# Patient Record
Sex: Male | Born: 1950 | Race: White | Hispanic: No | Marital: Married | Smoking: Never smoker
Health system: Southern US, Community
[De-identification: ages and names within clinical notes are randomized; demographics above are authoritative.]

## PROBLEM LIST (undated history)

## (undated) DIAGNOSIS — J9601 Acute respiratory failure with hypoxia: Secondary | ICD-10-CM

## (undated) DIAGNOSIS — I10 Essential (primary) hypertension: Secondary | ICD-10-CM

## (undated) DIAGNOSIS — M1990 Unspecified osteoarthritis, unspecified site: Secondary | ICD-10-CM

## (undated) DIAGNOSIS — Z9889 Other specified postprocedural states: Secondary | ICD-10-CM

## (undated) DIAGNOSIS — I4892 Unspecified atrial flutter: Secondary | ICD-10-CM

## (undated) DIAGNOSIS — M353 Polymyalgia rheumatica: Secondary | ICD-10-CM

## (undated) DIAGNOSIS — C060 Malignant neoplasm of cheek mucosa: Secondary | ICD-10-CM

## (undated) DIAGNOSIS — I2699 Other pulmonary embolism without acute cor pulmonale: Secondary | ICD-10-CM

## (undated) DIAGNOSIS — I2510 Atherosclerotic heart disease of native coronary artery without angina pectoris: Secondary | ICD-10-CM

## (undated) DIAGNOSIS — R760 Raised antibody titer: Secondary | ICD-10-CM

## (undated) DIAGNOSIS — M797 Fibromyalgia: Secondary | ICD-10-CM

## (undated) DIAGNOSIS — Z87442 Personal history of urinary calculi: Secondary | ICD-10-CM

## (undated) DIAGNOSIS — I5033 Acute on chronic diastolic (congestive) heart failure: Secondary | ICD-10-CM

## (undated) DIAGNOSIS — I4891 Unspecified atrial fibrillation: Secondary | ICD-10-CM

## (undated) DIAGNOSIS — I509 Heart failure, unspecified: Secondary | ICD-10-CM

## (undated) DIAGNOSIS — J189 Pneumonia, unspecified organism: Secondary | ICD-10-CM

## (undated) DIAGNOSIS — Z951 Presence of aortocoronary bypass graft: Secondary | ICD-10-CM

## (undated) DIAGNOSIS — Z8679 Personal history of other diseases of the circulatory system: Secondary | ICD-10-CM

## (undated) DIAGNOSIS — I340 Nonrheumatic mitral (valve) insufficiency: Secondary | ICD-10-CM

## (undated) DIAGNOSIS — E7800 Pure hypercholesterolemia, unspecified: Secondary | ICD-10-CM

## (undated) DIAGNOSIS — I499 Cardiac arrhythmia, unspecified: Secondary | ICD-10-CM

## (undated) HISTORY — DX: Unspecified osteoarthritis, unspecified site: M19.90

## (undated) HISTORY — DX: Unspecified atrial fibrillation: I48.91

## (undated) HISTORY — DX: Unspecified atrial flutter: I48.92

## (undated) HISTORY — DX: Polymyalgia rheumatica: M35.3

## (undated) HISTORY — DX: Raised antibody titer: R76.0

## (undated) HISTORY — DX: Pure hypercholesterolemia, unspecified: E78.00

## (undated) HISTORY — PX: FRACTURE SURGERY: SHX138

## (undated) HISTORY — DX: Heart failure, unspecified: I50.9

## (undated) HISTORY — PX: HERNIA REPAIR: SHX51

## (undated) HISTORY — DX: Acute respiratory failure with hypoxia: J96.01

## (undated) HISTORY — PX: OTHER SURGICAL HISTORY: SHX169

## (undated) HISTORY — PX: APPENDECTOMY: SHX54

## (undated) HISTORY — DX: Acute on chronic diastolic (congestive) heart failure: I50.33

## (undated) HISTORY — DX: Other pulmonary embolism without acute cor pulmonale: I26.99

## (undated) HISTORY — PX: SHOULDER SURGERY: SHX246

## (undated) HISTORY — DX: Atherosclerotic heart disease of native coronary artery without angina pectoris: I25.10

---

## 2000-02-09 ENCOUNTER — Ambulatory Visit (HOSPITAL_BASED_OUTPATIENT_CLINIC_OR_DEPARTMENT_OTHER): Admission: RE | Admit: 2000-02-09 | Discharge: 2000-02-09 | Payer: Self-pay | Admitting: Orthopedic Surgery

## 2001-01-17 ENCOUNTER — Ambulatory Visit (HOSPITAL_BASED_OUTPATIENT_CLINIC_OR_DEPARTMENT_OTHER): Admission: RE | Admit: 2001-01-17 | Discharge: 2001-01-17 | Payer: Self-pay | Admitting: Orthopedic Surgery

## 2001-03-16 ENCOUNTER — Encounter: Admission: RE | Admit: 2001-03-16 | Discharge: 2001-03-16 | Payer: Self-pay | Admitting: Infectious Diseases

## 2001-04-19 ENCOUNTER — Encounter: Admission: RE | Admit: 2001-04-19 | Discharge: 2001-04-19 | Payer: Self-pay | Admitting: Infectious Diseases

## 2001-09-13 ENCOUNTER — Encounter: Admission: RE | Admit: 2001-09-13 | Discharge: 2001-09-13 | Payer: Self-pay | Admitting: Infectious Diseases

## 2004-04-07 ENCOUNTER — Emergency Department (HOSPITAL_COMMUNITY): Admission: EM | Admit: 2004-04-07 | Discharge: 2004-04-07 | Payer: Self-pay | Admitting: Emergency Medicine

## 2008-05-10 DIAGNOSIS — I2699 Other pulmonary embolism without acute cor pulmonale: Secondary | ICD-10-CM

## 2008-05-10 HISTORY — DX: Other pulmonary embolism without acute cor pulmonale: I26.99

## 2008-07-29 ENCOUNTER — Inpatient Hospital Stay (HOSPITAL_COMMUNITY): Admission: EM | Admit: 2008-07-29 | Discharge: 2008-07-31 | Payer: Self-pay | Admitting: Emergency Medicine

## 2008-07-29 DIAGNOSIS — R760 Raised antibody titer: Secondary | ICD-10-CM | POA: Insufficient documentation

## 2008-07-29 HISTORY — DX: Raised antibody titer: R76.0

## 2008-10-02 DIAGNOSIS — I119 Hypertensive heart disease without heart failure: Secondary | ICD-10-CM | POA: Insufficient documentation

## 2008-10-02 DIAGNOSIS — IMO0001 Reserved for inherently not codable concepts without codable children: Secondary | ICD-10-CM

## 2008-10-02 DIAGNOSIS — Z86711 Personal history of pulmonary embolism: Secondary | ICD-10-CM

## 2008-10-03 ENCOUNTER — Ambulatory Visit: Payer: Self-pay | Admitting: Critical Care Medicine

## 2008-10-04 ENCOUNTER — Telehealth (INDEPENDENT_AMBULATORY_CARE_PROVIDER_SITE_OTHER): Payer: Self-pay | Admitting: *Deleted

## 2008-10-04 LAB — CONVERTED CEMR LAB
ALT: 30 units/L (ref 0–53)
AST: 30 units/L (ref 0–37)
Albumin: 4 g/dL (ref 3.5–5.2)
Alkaline Phosphatase: 44 units/L (ref 39–117)
BUN: 27 mg/dL — ABNORMAL HIGH (ref 6–23)
Basophils Absolute: 0 10*3/uL (ref 0.0–0.1)
Basophils Relative: 0.2 % (ref 0.0–3.0)
Bilirubin, Direct: 0 mg/dL (ref 0.0–0.3)
CO2: 29 meq/L (ref 19–32)
Calcium: 8.9 mg/dL (ref 8.4–10.5)
Chloride: 109 meq/L (ref 96–112)
Creatinine, Ser: 1.3 mg/dL (ref 0.4–1.5)
Eosinophils Absolute: 0.2 10*3/uL (ref 0.0–0.7)
Eosinophils Relative: 1.9 % (ref 0.0–5.0)
GFR calc non Af Amer: 59.84 mL/min (ref >60)
Glucose, Bld: 94 mg/dL (ref 70–99)
HCT: 49 % (ref 39.0–52.0)
Hemoglobin: 16.8 g/dL (ref 13.0–17.0)
Lymphocytes Relative: 21 % (ref 12.0–46.0)
Lymphs Abs: 1.8 10*3/uL (ref 0.7–4.0)
MCHC: 34.3 g/dL (ref 30.0–36.0)
MCV: 94.3 fL (ref 78.0–100.0)
Monocytes Absolute: 0.7 10*3/uL (ref 0.1–1.0)
Monocytes Relative: 8.6 % (ref 3.0–12.0)
Neutro Abs: 5.9 10*3/uL (ref 1.4–7.7)
Neutrophils Relative %: 68.3 % (ref 43.0–77.0)
Platelets: 155 10*3/uL (ref 150.0–400.0)
Potassium: 4.1 meq/L (ref 3.5–5.1)
RBC: 5.2 M/uL (ref 4.22–5.81)
RDW: 12.9 % (ref 11.5–14.6)
Sodium: 141 meq/L (ref 135–145)
Total Bilirubin: 1.2 mg/dL (ref 0.3–1.2)
Total Protein: 7 g/dL (ref 6.0–8.3)
WBC: 8.6 10*3/uL (ref 4.5–10.5)

## 2008-12-23 ENCOUNTER — Encounter: Payer: Self-pay | Admitting: Internal Medicine

## 2008-12-25 ENCOUNTER — Ambulatory Visit: Payer: Self-pay | Admitting: Internal Medicine

## 2008-12-25 DIAGNOSIS — R05 Cough: Secondary | ICD-10-CM

## 2009-01-01 ENCOUNTER — Ambulatory Visit: Payer: Self-pay | Admitting: Critical Care Medicine

## 2009-03-05 ENCOUNTER — Ambulatory Visit: Payer: Self-pay | Admitting: Critical Care Medicine

## 2009-04-10 ENCOUNTER — Telehealth: Payer: Self-pay | Admitting: Critical Care Medicine

## 2009-04-10 LAB — CONVERTED CEMR LAB
AntiThromb III Func: 91 % (ref 76–126)
Anticardiolipin IgA: <3 (ref <10)
Anticardiolipin IgG: 3 (ref <10)
Anticardiolipin IgM: <3 (ref <10)
Homocysteine: 8.6 micromoles/L (ref 4.0–15.4)
Protein C Activity: 138 % — ABNORMAL HIGH (ref 75–133)
Protein S Activity: 133 % — ABNORMAL HIGH (ref 69–129)
Protein S Ag, Total: 100 % (ref 70–140)

## 2009-10-22 ENCOUNTER — Encounter: Payer: Self-pay | Admitting: Critical Care Medicine

## 2009-12-11 ENCOUNTER — Telehealth: Payer: Self-pay | Admitting: Critical Care Medicine

## 2010-06-09 NOTE — Progress Notes (Signed)
Summary: Hypercoaguable panel  Phone Note Outgoing Call   Reason for Call: Discuss lab or test results Summary of Call: call pt and tell him I received his hypercoaguable panel blood test from Dr Merla Riches from 10/23/09 and it is normal Initial call taken by: Storm Frisk MD,  December 11, 2009 2:41 PM  Follow-up for Phone Call        Called, spoke with pt.  Pt advised hypercoag panal blood test from 6.16.11 received from Dr. Merla Riches and are normal per PW.  He verbalized understanding. Gweneth Dimitri RN  December 12, 2009 9:34 AM

## 2010-08-20 LAB — BASIC METABOLIC PANEL
BUN: 28 mg/dL — ABNORMAL HIGH (ref 6–23)
CO2: 29 mEq/L (ref 19–32)
CO2: 29 mEq/L (ref 19–32)
Calcium: 8.2 mg/dL — ABNORMAL LOW (ref 8.4–10.5)
Calcium: 9 mg/dL (ref 8.4–10.5)
Chloride: 103 mEq/L (ref 96–112)
Chloride: 103 mEq/L (ref 96–112)
Chloride: 103 mEq/L (ref 96–112)
Creatinine, Ser: 1.13 mg/dL (ref 0.4–1.5)
Creatinine, Ser: 1.18 mg/dL (ref 0.4–1.5)
GFR calc Af Amer: >60 mL/min (ref >60)
GFR calc Af Amer: >60 mL/min (ref >60)
GFR calc non Af Amer: >60 mL/min (ref >60)
GFR calc non Af Amer: >60 mL/min (ref >60)
Glucose, Bld: 102 mg/dL — ABNORMAL HIGH (ref 70–99)
Glucose, Bld: 110 mg/dL — ABNORMAL HIGH (ref 70–99)
Potassium: 3.6 mEq/L (ref 3.5–5.1)
Potassium: 3.6 mEq/L (ref 3.5–5.1)
Potassium: 3.6 mEq/L (ref 3.5–5.1)
Sodium: 136 mEq/L (ref 135–145)
Sodium: 138 mEq/L (ref 135–145)

## 2010-08-20 LAB — CBC
HCT: 37.4 % — ABNORMAL LOW (ref 39.0–52.0)
HCT: 45 % (ref 39.0–52.0)
Hemoglobin: 12.9 g/dL — ABNORMAL LOW (ref 13.0–17.0)
Hemoglobin: 15.5 g/dL (ref 13.0–17.0)
MCHC: 34.5 g/dL (ref 30.0–36.0)
MCHC: 34.5 g/dL (ref 30.0–36.0)
MCV: 92.3 fL (ref 78.0–100.0)
MCV: 93.4 fL (ref 78.0–100.0)
MCV: 93.4 fL (ref 78.0–100.0)
Platelets: 176 10*3/uL (ref 150–400)
RBC: 4.12 MIL/uL — ABNORMAL LOW (ref 4.22–5.81)
RBC: 4.82 MIL/uL (ref 4.22–5.81)
RDW: 13.4 % (ref 11.5–15.5)
RDW: 13.6 % (ref 11.5–15.5)
WBC: 11.1 10*3/uL — ABNORMAL HIGH (ref 4.0–10.5)
WBC: 9.6 10*3/uL (ref 4.0–10.5)

## 2010-08-20 LAB — URINALYSIS, ROUTINE W REFLEX MICROSCOPIC
Bilirubin Urine: NEGATIVE
Glucose, UA: NEGATIVE mg/dL
Hgb urine dipstick: NEGATIVE
Nitrite: NEGATIVE
Protein, ur: NEGATIVE mg/dL
Specific Gravity, Urine: 1.027 (ref 1.005–1.030)
Urobilinogen, UA: 0.2 mg/dL (ref 0.0–1.0)
pH: 5.5 (ref 5.0–8.0)

## 2010-08-20 LAB — PROTIME-INR
INR: 1 (ref 0.00–1.49)
INR: 1.2 (ref 0.00–1.49)
Prothrombin Time: 13.9 seconds (ref 11.6–15.2)
Prothrombin Time: 15.3 seconds — ABNORMAL HIGH (ref 11.6–15.2)
Prothrombin Time: 15.6 seconds — ABNORMAL HIGH (ref 11.6–15.2)

## 2010-08-20 LAB — BRAIN NATRIURETIC PEPTIDE: Pro B Natriuretic peptide (BNP): 45.6 pg/mL (ref 0.0–100.0)

## 2010-08-20 LAB — POCT CARDIAC MARKERS
CKMB, poc: <1 ng/mL — ABNORMAL LOW (ref 1.0–8.0)
Myoglobin, poc: 73.1 ng/mL (ref 12–200)
Troponin i, poc: <0.05 ng/mL (ref 0.00–0.09)

## 2010-08-20 LAB — TSH: TSH: 2.019 u[IU]/mL (ref 0.350–4.500)

## 2010-08-20 LAB — APTT: aPTT: 38 seconds — ABNORMAL HIGH (ref 24–37)

## 2010-08-20 LAB — LUPUS ANTICOAGULANT PANEL
DRVVT: 46 secs (ref 36.1–47.0)
Lupus Anticoagulant: DETECTED — AB
PTT Lupus Anticoagulant: 66.8 secs — ABNORMAL HIGH (ref 36.3–48.8)
PTTLA 4:1 Mix: 64.4 secs — ABNORMAL HIGH (ref 36.3–48.8)
PTTLA Confirmation: 12 secs — ABNORMAL HIGH (ref <8.0)

## 2010-08-20 LAB — DIFFERENTIAL
Basophils Absolute: 0 10*3/uL (ref 0.0–0.1)
Basophils Relative: 0 % (ref 0–1)
Eosinophils Absolute: 0.1 10*3/uL (ref 0.0–0.7)
Eosinophils Relative: 1 % (ref 0–5)
Lymphocytes Relative: 10 % — ABNORMAL LOW (ref 12–46)
Lymphs Abs: 1.1 10*3/uL (ref 0.7–4.0)
Monocytes Absolute: 1 10*3/uL (ref 0.1–1.0)
Monocytes Relative: 9 % (ref 3–12)
Neutro Abs: 8.9 10*3/uL — ABNORMAL HIGH (ref 1.7–7.7)
Neutrophils Relative %: 80 % — ABNORMAL HIGH (ref 43–77)

## 2010-08-20 LAB — PROTHROMBIN GENE MUTATION

## 2010-08-20 LAB — HEPATIC FUNCTION PANEL
AST: 20 U/L (ref 0–37)
Albumin: 3.3 g/dL — ABNORMAL LOW (ref 3.5–5.2)
Total Bilirubin: 1.1 mg/dL (ref 0.3–1.2)
Total Protein: 6.5 g/dL (ref 6.0–8.3)

## 2010-08-20 LAB — D-DIMER, QUANTITATIVE: D-Dimer, Quant: 1.52 ug/mL-FEU — ABNORMAL HIGH (ref 0.00–0.48)

## 2010-08-20 LAB — PROTEIN S, TOTAL: Protein S Ag, Total: 93 % (ref 70–140)

## 2010-08-20 LAB — HOMOCYSTEINE: Homocysteine: 7.3 umol/L (ref 4.0–15.4)

## 2010-08-20 LAB — FACTOR 5 ASSAY: Factor V Activity: 74 % (ref 62–140)

## 2010-08-20 LAB — PROTEIN C, TOTAL: Protein C, Total: 73 % (ref 70–140)

## 2010-09-22 NOTE — Discharge Summary (Signed)
Peter Bruce, Peter Bruce                   ACCOUNT NO.:  [id number]   MEDICAL RECORD NO.:  [id number]          PATIENT TYPE:  INP   LOCATION:  1401                         FACILITY:  [facility]   PHYSICIAN:  Monte Fantasia, MD  DATE OF BIRTH:  December 29, 1950   DATE OF ADMISSION:  07/29/2008  DATE OF DISCHARGE:                               DISCHARGE SUMMARY   PRIMARY CARE PHYSICIAN:  Harrel Lemon. Merla Riches, M.D.   UROLOGISTLynelle Smoke I. Patsi Sears, M.D.   DISCHARGE DIAGNOSES:  1. Diffuse multiple bilateral pulmonary embolisms.  2. Hypertension.  3. Fibromyalgia.  4. Degenerative joint disease.  5. Nephrolithiasis.  6. Lupus anticoagulant positive.   MEDICATIONS UPON DISCHARGE:  1. Rivaroxaban 15 mg p.o. b.i.d. for 21 days, and then 20 mg p.o.      daily for 6 months as per the research protocol.  2. Albuterol two puffs inhalation q.6h.  3. Hydrochlorothiazide 12 mg p.o. daily.  4. Lisinopril 20 mg p.o. daily.  5. Prevacid 30 mg p.o. daily.  6. Tramadol 50 mg p.o. b.i.d.  7. Verapamil 180 mg p.o. daily  8. Lisinopril 20 mg p.o. daily.  9. Temazepam 30 mg p.o. q.h.s. p.r.n. insomnia.  10.Prednisone 10 mg p.o. Qdaily 2-3 times a week p.r.n. pain.   COURSE DURING THE HOSPITAL STAY:  This 60 year old of Caucasian male  patient was admitted on July 29, 2008 with increasing shortness of  breath.  The patient felt that he was asymptomatic until three days ago,  and then she started feeling some difficulty breathing.  The symptoms  continued and continued and got worse until Sunday, and then he came in  to the ER.  The patient underwent a CT scan of the chest with contrast  which revealed a diffuse bilateral PE, and the patient was  anticoagulated with Lovenox and Coumadin.  Prior to the treatment, the  patient was evaluated for protein C, protein S and lupus anticoagulant.  His lupus anticoagulant study screen was positive.  The patient's  shortness of breath has improved significantly.  During  the stay in the  hospital, the patient was enrolled to participate in a research [employer] for Rivaroxaban factor Xa inhibitor treatment for  PE.  The patient has agreed to the same and was randomized for the  factor 10 inhibitor treatment.  As per the research protocol, the  patient is on 15 mg p.o. b.i.d. for 21 days and then followed by 20 mg  once daily.  The patient needs to follow up with the research group as  per their recommendations.  The research office number is [phone].  All  instructions will be provided as per the research group as per  discussions with them.   RADIOLOGICAL INVESTIGATIONS DONE DURING THE STAY IN THE HOSPITAL:  1. Chest x-ray done on July 29, 2008.  Impression - minimal      atelectasis and scaring of the left base; otherwise unremarkable.  2. CT angiogram of the chest done on July 29, 2008.  Impression -      multiple bilateral pulmonary embolism, most  prominent on the right,      mild right base atelectasis and small right pleural effusion.   LABORATORIES DONE DURING THE SAY IN THE HOSPITAL:  Total WBC 10.5,  hemoglobin 12.9, hematocrit 37.4, platelet count 164, PT is 35.3, INR  3.2.  Sodium 136, potassium 3.6, bicarbonate 29, chloride 103, BUN 22,  creatinine 1.06, glucose 110, calcium 8.2, total bilirubin 1.1, direct  0.2, indirect 0.9, alkaline phosphatase 38, AST 20, ALT 21, total  protein 6.5, albumin 3.3.  Cardiac enzymes have been negative.  TSH  2.019 and homocysteine 7.3.  UA was negative.  Lupus anticoagulant study  - PTT LA 66.8 which was high, PT LA confirmation 12.0 which is high.  PT  LA __________ mix is 64.4 which is high.  dRVVT 46.  dRVVT 1 x 1 mix not  applicable, and lupus anticoagulant is detected.  Prothrombin II gene  mutation not detected.   ASSESSMENT AND PLAN:  The patient at present denies any symptomatic  complaints.  Shortness of breath has resolved.  The patient is medically  stable to be discharged.   Will follow up with discharge instructions as  provided by the research group.  Will need to follow up with the  research facility at [phone] in a month, and, as per the discussions  with the research group, need to repeat LFTs within a month.  The  patient also needs to follow up with his primary care physician, Dr.  Merla Riches and Dr. Patsi Sears.  The patient has been informed regarding  the same.   Total time for discharge summary is 40 minutes.      Monte Fantasia, MD  Electronically Signed     MP/MEDQ  D:  07/31/2008  T:  07/31/2008  Job:  [id number]   cc:   Lynelle Smoke I. Patsi Sears, M.D.  Fax: [phone]   Harrel Lemon. Merla Riches, M.D.  Fax: [phone]

## 2010-09-22 NOTE — H&P (Signed)
Peter Bruce                   ACCOUNT NO.:  [id number]   MEDICAL RECORD NO.:  [id number]          PATIENT TYPE:  EMS   LOCATION:  ED                           FACILITY:  [facility]   PHYSICIAN:  Peter Earls, MD     DATE OF BIRTH:  02/02/1951   DATE OF ADMISSION:  07/29/2008  DATE OF DISCHARGE:                              HISTORY & PHYSICAL   PRIMARY CARE PHYSICIAN:  Peter Bruce, M.D.   CHIEF COMPLAINT:  Shortness of breath.   HISTORY OF PRESENT ILLNESS:  This is a 57-yea-rold white male patient  with a past medical history significant for hypertension, severe DJD,  who was admitted with a chief complaint of shortness of breath.  According to the patient he was apparently asymptomatic until 3 days ago  when patient started feeling some difficulty breathing, especially when  he was lying on his back.  Patient continued to feel that way and was  also having some difficulty taking deep inspiration.  The patient's  symptoms continued to get worse until Sunday when patient had a real  hard time breathing and had called a personal friend who had recommended  him to come to the emergency room for further evaluation and management.  Patient is a very active person, does not have a recent history of  travel or recent history of surgery or any reason for being immobile.   REVIEW OF SYSTEMS:  As above.  Rest of the review of systems is negative  .   PAST MEDICAL HISTORY:  1. Hypertension.  2. Fibromyalgia.  3. Kidney stones.  4. DJD.   PAST SURGICAL HISTORY:  Wrist surgery, shoulder surgery, hernia repair,  appendectomy.   FAMILY HISTORY:  Positive for coronary artery disease in father who had  a heart attack at 72 and hypertension.   SOCIAL HISTORY:  Negative for smoking, alcohol, IV drug abuse.   ALLERGIES:  No known drug allergies.   MEDICATIONS:  1. Verapamil 180 mg daily.  2. Lisinopril/HCTZ 20/12.5 mg daily.  3. Temazepam 30 mg as needed.  4. Prednisone 10 ng  2-3 times weekly for severe arthritis and joint      pain.   PHYSICAL EXAMINATION:  VITALS:  Temperature 98.9, blood pressure 86-  158/50s-109.  Pulse 70s-80.  Respiration 20.  Pulse oximetry 97-98%.  Patient awake, alert, oriented x3.  Does not appear to be in acute  distress.  HEENT:  Pupils equal, round, reactive to light.  No icterus.  No pallor.  Extraocular movements are intact.  Mucous membranes are moist.  NECK:  Supple.  No JVD.  No lymphadenopathy.  CVS:  S1, S2, regular.  No murmurs, heaves or gallops.  CHEST:  Patient has right basilar crackles.  Otherwise clear.  ABDOMEN:  Soft, obese, nontender.  Bowel sounds present.  No  hepatosplenomegaly.  EXTREMITIES:  No cyanosis, clubbing or edema.  CNS:  Sensorimotor grossly intact.  Cranial nerves II-XII are intact.  SKIN:  No rashes.  MUSCULOSKELETAL:  Unremarkable.   CT scan of the chest with contrast revealed bilateral PE.  INR  is 1.0.  UA is negative.  Basic metabolic panel is fairly  unremarkable except for creatinine of 1.18, BUN of 28.  White count  11.1, H and H are 15.5 and 45.  Chest x-ray showed mild atelectasis of the left lung base.   IMPRESSION:  1. Acute bilateral pulmonary emboli.  2. Hypoxia.  3. History of fibromyalgia.  4. Hypertension.  5. History of degenerative joint disease.  6. History of kidney stones.   PLAN:  1. Admit to telemetry.  2. Lovenox and Coumadin will be started.  We will check the      hypercoagulopathy panel.  3. Continue pulse oximetry with breathing treatment.      Peter Earls, MD  Electronically Signed     NS/MEDQ  D:  07/29/2008  T:  07/29/2008  Job:  [id number]   cc:   Peter Bruce. Merla Bruce, M.D.  Fax: [phone]

## 2010-09-25 NOTE — Op Note (Signed)
[place]. [facility]  Patient:    Peter Bruce, Peter Bruce                          MRN: [id number] Proc. Date: 02/09/00 Adm. 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  OPERATION:  Release of right transverse carpal ligament.  SURGEON:  Katy Fitch. Sypher, Montez Hageman., M.D.  ASSISTANT:  Jonni Sanger, P.A.-C.  ANESTHESIA:  General by mask, supervising anesthesiologist J. Claybon Jabs, M.D.  INDICATIONS:  Peter Bruce is a 60 year old man who has had bilateral hand pain and numbness, right greater than left.  Clinical evaluation reveals that he has had a history of cervical degenerative disk disease and probable carpal tunnel syndrome.  Recent electrodiagnostic studies by Dr. Meryl Crutch confirmed severe right carpal tunnel syndrome and moderate left carpal tunnel syndrome.  After informed consent, he is brought to the operating room at this time for release of his right transverse carpal ligament.  DESCRIPTION OF PROCEDURE:  Peter Bruce is brought to the operating room and placed in a supine position on the operating table.  Following induction of general anesthesia, the left arm is prepped with Betadine scrubbing solution and sterilely draped.  Following exsanguination of the limb with Esmarch bandage, arterial tourniquet was inflated to 230 mmHg.  I proceeded to commence with a short incision in the line of the ring finger in the palm.  Subcutaneous tissue was carefully divided from the palmar fascia.  This was split longitudinally ______ median nerve.  These were followed back to the transverse carpal ligament which was carefully isolated from the median nerve.  Ligament was released along its border extending into the distal forearm.  Bleeding  points along the released ligament were electrocauterized with bipolar current followed by repair of the skin with internal 0 Prolene suture.  A compressive dressing was applied, and the volar plaster splint placed for dorsal flexion.  For aftercare, Peter Bruce will be given a prescription for Percocet 5/325, 1-2 tablets p.o. q.4-6h. p.r.n. pain.  He will return to the office and will follow up in one week to 10 days or sooner p.r.n. problems. DD:  02/09/00 TD:  02/09/00 Job: [id number]

## 2010-09-25 NOTE — Op Note (Signed)
[place]. [facility]  Patient:    CHEICK, SUHR Visit Number: [id number] MRN: [id number]          Service Type: DSU Location: [facility] Attending Physician:  Susa Day Dictated by:   Katy Fitch Naaman Plummer., M.D. Proc. 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  OPERATION PERFORMED: 1. Diagnostic arthroscopy, right radiocarpal and ulnocarpal joints and distal    radioulnar joint. 2. Arthroscopic synovectomy of radiocarpal, ulnocarpal and distal radioulnar    joint with extensive debridement of fragmented triangular fibrocartilage. 3. Open 3 mm ulnar shortening in the manner of Feldon.  SURGEON:  Katy Fitch. Sypher, Montez Hageman., M.D.  ASSISTANT:  Jonni Sanger, P.A.  ANESTHESIA:  General by LMA.  SUPERVISING ANESTHESIOLOGIST:  Dr. Gelene Mink.  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  He has had a history of ulnar sided right wrist pain for more than one year.  Clinical examination revealed tenderness on palpation in the region of his ulnar head and fovea as well as crepitation with ulnar deviation and rotation of his forearm in pronation and supination. He had a positive ulnocarpal grind and ulnocarpal translation test.  He had a positive LT ballottement test.  Preoperative x-rays revealed that he was 2 mm ulnar plus.  He had some changes in the lunate  suggestive of chronic ulnocarpal abutment.  We recommended diagnostic arthroscopy of the risk followed by anticipated debridement of the triangular fibrocartilage tear, chondromalacia and ulnar shortening.  After informed consent he is brought to the operating room at this time.  DESCRIPTION OF PROCEDURE:  Terik Haughey was brought to the operating room and placed in supine position on the operating table.  Following induction of general anesthesia by LMA, the right arm was prepped with Betadine soap and solution and sterilely draped.  A pneumatic tourniquet was applied to the proximal brachium.  One gram of Ancef was administered as an IV prophylactic antibiotic.  Following exsanguination of the limb with an Esmarch bandage, the arterial tourniquet was inflated to 220 mmHg.  The procedure commenced with distraction of the wrist in a tower designed for wrist arthroscopy.  10 pounds of traction was applied through finger traps on the index and long finger with countertraction on the padded forearm.  The radiocarpal space was sounded with an 18 gauge needle and the wrist distended with sterile saline.  The 3-4 dorsal portal was made with a 15 scalpel blade and the joint was entered with a hemostat followed by blunt trocar and the sheath.  Diagnostic arthroscopy revealed extensive synovitis on the dorsal aspect of the radiocarpal articulation, ulnocarpal articulation, a partial scapholunate interosseous ligament tear, particularly in the central third and a partial lunatotriquetral interosseous ligament tear.  There was extensive chondromalacia on the dorsoulnar aspect of the lunate.  There was a large fragmented central triangular fibrocartilage tear that was not only fragmented in a transverse manner but  also delaminated heading towards the peripheral triangular fibrocartilage.  The hyaline articular cartilage on the scaphoid radial aspect of lunate and triquetrum was intact.  The radial  lunate and scaphoid facets had normal hyaline articular cartilage.  The radial scaphoid, radioscaphocapitate and short radiolunate ligaments were all normal.  The ulnolunate and ulnotriquetral ligaments were normal.  A 6-R portal was created with a 16 gauge needle.  The initial portal was on the low side of the triangular fibrocartilage which allowed synovectomy of the distal radioulnar joint and extensive debridement of the deep surface of the triangular fibrocartilage.  A second 6R portal was created more distal to allow debridement of the carpal surface of the peripheral triangular fibrocartilage and to perform an ulnar sided synovectomy.  The prestyloid recess was noted to be normal.  There was no sign of significant peripheral triangular fibrocartilage tear.  After thorough debridement of the ulnocarpal joint and debridement of the triangular fibrocartilage to smooth margins, the scope was then placed in the 6-R portal and a dorsal synovectomy completed.  A loose body was identified and removed with suction basket forceps.  The scapholunate interosseous ligament was also debrided through the 3-4 portal with a suction shaver.  Photographic documentation of the wrist pathology was pathology was completed followed by removal of the arthroscopic equipment.  A short transverse incision was fashioned over the distal ulna. The extensor retinaculum between the fifth and sixth dorsal compartment was split in line with its fibers followed by an L-shaped incision in the capsule. The head of the ulna was exposed by synovectomy with rongeur and suction shaver followed by use of a Therapist, nutritional and a Regulatory affairs officer to expose the ulnar head.  The distal 3 mm of ulna was removed with an oscillating saw utilizing an osteotome to complete the osteotomy.  The shaver was used to bevel the margins.  A C-arm fluoroscope was used to document a very satisfactory resection.  Under direct vision I  could see that the ulna had been shortened approximately 1 to 2 mm below the level of the sigmoid notch of the radius.  The joint was  thoroughly lavaged with sterile saline followed by repair of the capsule with mattress sutures of 3-0 Ethibond.  The retinaculum was not repaired.  The wound was then repaired with intradermal 3-0 Prolene suture.  A compressive dressing was applied with a sugartong splint maintaining the forearm in supination.  There were no apparent complications.  The patient tolerated the surgery and anesthesia well and was transferred to the recovery room with stable vital signs. Dictated by:   Katy Fitch Naaman Plummer., M.D. Attending Physician:  Susa Day DD:  01/17/01 TD:  01/17/01 Job: [id number]

## 2011-04-21 ENCOUNTER — Ambulatory Visit (INDEPENDENT_AMBULATORY_CARE_PROVIDER_SITE_OTHER): Payer: BC Managed Care – PPO | Admitting: Internal Medicine

## 2011-04-21 ENCOUNTER — Encounter: Payer: Self-pay | Admitting: Internal Medicine

## 2011-04-21 DIAGNOSIS — G4700 Insomnia, unspecified: Secondary | ICD-10-CM

## 2011-04-21 DIAGNOSIS — F419 Anxiety disorder, unspecified: Secondary | ICD-10-CM

## 2011-04-21 DIAGNOSIS — F411 Generalized anxiety disorder: Secondary | ICD-10-CM

## 2011-04-21 DIAGNOSIS — I10 Essential (primary) hypertension: Secondary | ICD-10-CM

## 2011-05-13 ENCOUNTER — Other Ambulatory Visit: Payer: Self-pay | Admitting: Critical Care Medicine

## 2011-05-13 ENCOUNTER — Telehealth: Payer: Self-pay | Admitting: Critical Care Medicine

## 2011-05-13 ENCOUNTER — Ambulatory Visit (INDEPENDENT_AMBULATORY_CARE_PROVIDER_SITE_OTHER)
Admission: RE | Admit: 2011-05-13 | Discharge: 2011-05-13 | Disposition: A | Discharge status: Home / Self Care | Payer: BC Managed Care – PPO | Source: Ambulatory Visit | Attending: Critical Care Medicine | Admitting: Critical Care Medicine

## 2011-05-13 DIAGNOSIS — I2699 Other pulmonary embolism without acute cor pulmonale: Secondary | ICD-10-CM

## 2011-05-13 HISTORY — DX: Essential (primary) hypertension: I10

## 2011-05-13 MED ORDER — IOHEXOL 300 MG/ML  SOLN
80.0000 mL | Freq: Once | INTRAMUSCULAR | Status: AC | PRN
  Administered 2011-05-13: 80 mL via INTRAVENOUS

## 2011-05-13 NOTE — Telephone Encounter (Signed)
Per TD, PW has already spoken with pt regarding his CT results.  Will sign off on this message.

## 2011-05-13 NOTE — Telephone Encounter (Signed)
Called and spoke with pt.  Pt is on his way over to [facility]. To have scan done at 4:15 today per orders put in by PW.  TD spoke with Peter Bruce at  [facility]. And confirmed pt's appt for today at 4:15.

## 2011-05-13 NOTE — Telephone Encounter (Signed)
PW pt calling with sxs he had several years ago when diagnosed with PE. Pt was Rivaroxiban study pt and last seen in 2010. Pt c/o increased sob, no leg pain, but tight through his abdomen or when bending. He says these are the same sxs he felt with the PE. TP can pt be worked in this afternoon on your schedule (blocked/held slot)?  Pt does not want to go to the ER. Pls advise.

## 2011-05-13 NOTE — Telephone Encounter (Signed)
Message was routed to TP by Lawson Fiscal as pt had wanted to be worked in the office to be seen today.  However, I spoke with TP regarding her recs for pt and she recommends this message be forwarded to PW  who is working in [employer] today to get his recs first.  PW, please advise of your recs.  Thanks.

## 2011-05-13 NOTE — Telephone Encounter (Signed)
I advised pt numerous times to go to ED or UC.  Pt stated he did not want to go there with out being instructed by PW to do so.  Explained to pt PW is in [place] & that I would put in an urgent message for PW.  Antionette Fairy

## 2011-08-30 ENCOUNTER — Ambulatory Visit (INDEPENDENT_AMBULATORY_CARE_PROVIDER_SITE_OTHER): Payer: BC Managed Care – PPO | Admitting: Internal Medicine

## 2011-08-30 VITALS — BP 160/98 | HR 86 | Temp 98.3°F | Resp 16 | Ht 67.0 in | Wt 206.0 lb

## 2011-08-30 DIAGNOSIS — I480 Paroxysmal atrial fibrillation: Secondary | ICD-10-CM | POA: Insufficient documentation

## 2011-08-30 DIAGNOSIS — G8929 Other chronic pain: Secondary | ICD-10-CM

## 2011-08-30 DIAGNOSIS — I4891 Unspecified atrial fibrillation: Secondary | ICD-10-CM

## 2011-08-30 DIAGNOSIS — F419 Anxiety disorder, unspecified: Secondary | ICD-10-CM

## 2011-08-30 DIAGNOSIS — N400 Enlarged prostate without lower urinary tract symptoms: Secondary | ICD-10-CM

## 2011-08-30 DIAGNOSIS — N529 Male erectile dysfunction, unspecified: Secondary | ICD-10-CM

## 2011-08-30 DIAGNOSIS — M797 Fibromyalgia: Secondary | ICD-10-CM

## 2011-08-30 DIAGNOSIS — I10 Essential (primary) hypertension: Secondary | ICD-10-CM

## 2011-08-30 DIAGNOSIS — G4710 Hypersomnia, unspecified: Secondary | ICD-10-CM

## 2011-08-30 DIAGNOSIS — G4700 Insomnia, unspecified: Secondary | ICD-10-CM | POA: Insufficient documentation

## 2011-08-30 LAB — COMPREHENSIVE METABOLIC PANEL
ALT: 25 U/L (ref 0–53)
AST: 28 U/L (ref 0–37)
Albumin: 4.5 g/dL (ref 3.5–5.2)
Alkaline Phosphatase: 47 U/L (ref 39–117)
Calcium: 9.7 mg/dL (ref 8.4–10.5)
Chloride: 101 mEq/L (ref 96–112)
Creat: 1.21 mg/dL (ref 0.50–1.35)
Potassium: 3.9 mEq/L (ref 3.5–5.3)

## 2011-08-30 LAB — POCT CBC
Granulocyte percent: 75.2 %G (ref 37–80)
MID (cbc): 0.7 (ref 0–0.9)
MPV: 10.6 fL (ref 0–99.8)
POC LYMPH PERCENT: 17.8 %L (ref 10–50)
POC MID %: 7 %M (ref 0–12)
Platelet Count, POC: 214 10*3/uL (ref 142–424)
RBC: 5.16 M/uL (ref 4.69–6.13)
RDW, POC: 14 %

## 2011-08-30 LAB — LIPID PANEL
HDL: 52 mg/dL (ref >39)
LDL Cholesterol: 139 mg/dL — ABNORMAL HIGH (ref 0–99)
Total CHOL/HDL Ratio: 4 Ratio

## 2011-08-30 LAB — TSH: TSH: 1.282 u[IU]/mL (ref 0.350–4.500)

## 2011-08-30 MED ORDER — PREDNISONE 10 MG PO TABS: 10.0000 mg | ORAL_TABLET | Freq: Every day | ORAL | Status: DC

## 2011-08-30 MED ORDER — HYDROCHLOROTHIAZIDE 12.5 MG PO CAPS: 12.5000 mg | ORAL_CAPSULE | Freq: Every day | ORAL | Status: DC

## 2011-08-30 MED ORDER — ALPRAZOLAM 0.25 MG PO TABS: 0.2500 mg | ORAL_TABLET | ORAL | Status: DC

## 2011-08-30 MED ORDER — AMLODIPINE BESYLATE 10 MG PO TABS: ORAL_TABLET | ORAL | Status: DC

## 2011-08-30 MED ORDER — AMLODIPINE BESYLATE 10 MG PO TABS: 10.0000 mg | ORAL_TABLET | Freq: Every day | ORAL | Status: DC

## 2011-08-30 MED ORDER — ZOLPIDEM TARTRATE 10 MG PO TABS: ORAL_TABLET | ORAL | Status: DC

## 2011-08-30 MED ORDER — TEMAZEPAM 30 MG PO CAPS: ORAL_CAPSULE | ORAL | Status: DC

## 2011-08-30 MED ORDER — ZOLPIDEM TARTRATE 10 MG PO TABS: 10.0000 mg | ORAL_TABLET | Freq: Every evening | ORAL | Status: DC | PRN

## 2011-08-30 MED ORDER — AMPHETAMINE-DEXTROAMPHETAMINE 10 MG PO TABS: 10.0000 mg | ORAL_TABLET | Freq: Every day | ORAL | Status: DC

## 2011-08-30 MED ORDER — TADALAFIL 2.5 MG PO TABS: 2.5000 mg | ORAL_TABLET | Freq: Every day | ORAL | Status: DC

## 2011-08-30 MED ORDER — CLONAZEPAM 0.5 MG PO TABS: 0.5000 mg | ORAL_TABLET | Freq: Two times a day (BID) | ORAL | Status: DC | PRN

## 2011-08-30 NOTE — Progress Notes (Signed)
Subjective:    Patient ID: Peter Bruce, male    DOB: 19-Mar-1951, 61 y.o.   MRN: [id number]  HPIfell on R shoulder-1/13 then hadTrouble breathing so in of past PTEs called Dr Delford Field who ordered ct-angio=WNL--so went to Dr Mayford Knife the Chiropracter who has been working on shoulder /neck-much better but Nocturnal pain persists Patient Active Problem List  Diagnoses  . HYPERTENSION  . PULMONARY EMBOLISM  . OTHER ACUTE SINUSITIS  . NEPHROLITHIASIS  . DEGENERATIVE JOINT DISEASE  . FIBROMYALGIA  . COUGH  . NONSPECIFIC ABNORMAL TOXICOLOGICAL FINDINGS  . 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 Patsi Sears started him on Adderall for hypersomnolence as he was unable to get through the work day. 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 He prefers to use these medicines rather than being on chronic narcotics for pain.    Review of Systems  Constitutional: Positive for fatigue. Negative for activity change, appetite change and unexpected weight change.  HENT: Negative.   Eyes: Negative.   Respiratory: Negative for cough, shortness of breath and wheezing.   Cardiovascular: Negative for chest pain, palpitations and leg swelling.  Gastrointestinal: Negative.   Neurological: Negative for dizziness, seizures, speech difficulty and headaches.  Psychiatric/Behavioral: Negative for confusion and decreased concentration.       Anxiety is controlled with when necessary Xanax/neuropathic pain is much improved with Klonopin  ED/BPH-Has not tried 3 different medications from Dr. Patsi Sears, all with chronic side  effects are unacceptable/he was like to try daily Cialis      Objective:   Physical Exam  Constitutional: He is oriented to person, place, and time. He appears well-developed and well-nourished. No distress.  HENT:  Head: Normocephalic.  Eyes: EOM are normal. Pupils are equal, round, and reactive to light.  Neck: Normal range of motion. Neck supple. No thyromegaly present.  Cardiovascular: Normal rate and regular rhythm.   No murmur heard. Pulmonary/Chest: Breath sounds normal.  Musculoskeletal: He exhibits no edema.       The right shoulder has normal abduction and abduction with no pain on empty can test or external rotation/he has good push against resistance without pain  Neurological: He is alert and oriented to person, place, and time.  Psychiatric: He has a normal mood and affect.          Assessment & Plan:   Patient Active Problem List  Diagnoses  . HYPERTENSION  . PULMONARY EMBOLISM  . OTHER ACUTE SINUSITIS  . NEPHROLITHIASIS  . DEGENERATIVE JOINT DISEASE  . FIBROMYALGIA  . COUGH  . NONSPECIFIC ABNORMAL TOXICOLOGICAL FINDINGS  . Anxiety  . BPH (benign prostatic hyperplasia)  . ED (erectile dysfunction)  . Atrial fibrillation, controlled  . Hypersomnolence  . Chronic pain  . Insomnia   Routine labs including testosterone free and total Meds ordered this encounter  Medications  . DISCONTD: zolpidem (AMBIEN) 10 MG tablet    Sig: Take 10 mg by mouth at bedtime as needed.  Marland Kitchen DISCONTD: amphetamine-dextroamphetamine (ADDERALL) 10 MG tablet    Sig: Take 10 mg by mouth daily.  Marland Kitchen DISCONTD: predniSONE (  DELTASONE) 10 MG tablet    Sig: Take 10 mg by mouth daily.  Marland Kitchen DISCONTD: clonazePAM (KLONOPIN) 0.5 MG tablet    Sig: Take 0.5 mg by mouth 2 (two) times daily as needed.  . sildenafil (VIAGRA) 100 MG tablet    Sig: Take 100 mg by mouth daily as needed.  . Tadalafil 2.5 MG TABS    Sig: Take 1 tablet (2.5 mg total) by mouth daily.    Dispense:  30 each    Refill:   5  . DISCONTD: zolpidem (AMBIEN) 10 MG tablet    Sig: Take 1 tablet (10 mg total) by mouth at bedtime as needed.    Dispense:  30 tablet    Refill:  5  . temazepam (RESTORIL) 30 MG capsule    Sig: 1 at 7pm to prepare for bed    Dispense:  30 capsule    Refill:  5  . predniSONE (DELTASONE) 10 MG tablet    Sig: Take 1 tablet (10 mg total) by mouth daily.    Dispense:  30 tablet    Refill:  1  . hydrochlorothiazide (MICROZIDE) 12.5 MG capsule    Sig: Take 1 capsule (12.5 mg total) by mouth daily.    Dispense:  90 capsule    Refill:  1  . clonazePAM (KLONOPIN) 0.5 MG tablet    Sig: Take 1 tablet (0.5 mg total) by mouth 2 (two) times daily as needed (for neuropathic pain).    Dispense:  60 tablet    Refill:  5  . amphetamine-dextroamphetamine (ADDERALL) 10 MG tablet    Sig: Take 1 tablet (10 mg total) by mouth daily.    Dispense:  60 tablet    Refill:  0  . amLODipine (NORVASC) 10 MG tablet    Sig: Take 1 tablet (10 mg total) by mouth daily. One and one-half daily    Dispense:  90 tablet    Refill:  1  . ALPRAZolam (XANAX) 0.25 MG tablet    Sig: Take 1 tablet (0.25 mg total) by mouth 1 day or 1 dose. For anxiety or dysphagia    Dispense:  30 tablet    Refill:  5  . zolpidem (AMBIEN) 10 MG tablet    Sig: 1 at bedtime and 1/2 when wakes at 3am    Dispense:  45 tablet    Refill:  5   He obviously has numerous issues to settle/we will need to consider a sleep test at some point to be sure that he doesn't have narcolepsy or obstructive sleep apnea/they changed his Cialis may help his BPH symptoms and control ED/he is working hard to avoid chronic narcotics for pain/is unclear whether his recent easy fatigability is secondary to his current problems are represents something new Followup 11mo

## 2011-08-30 NOTE — Progress Notes (Signed)
Addended by: Tonye Pearson on: 08/30/2011 02:18 PM   Modules accepted: Orders

## 2011-08-31 LAB — TESTOSTERONE, FREE, TOTAL, SHBG
Testosterone-% Free: 1.1 % — ABNORMAL LOW (ref 1.6–2.9)
Testosterone: 491.15 ng/dL (ref 300–890)

## 2011-09-01 ENCOUNTER — Encounter: Payer: Self-pay | Admitting: Internal Medicine

## 2011-09-02 ENCOUNTER — Encounter: Payer: Self-pay | Admitting: Internal Medicine

## 2011-09-14 ENCOUNTER — Telehealth: Payer: Self-pay

## 2011-09-14 NOTE — Telephone Encounter (Signed)
[facility] called and wanted clarification regarding pt's Rx for Adderall written 08/30/11. Sig reads one daily, but pt has been taking one BID and was written for #60. Asked Dr Merla Riches and then called [facility] back and clarified that  Rx should be for one BID #60.

## 2011-09-22 ENCOUNTER — Ambulatory Visit: Payer: BC Managed Care – PPO | Admitting: Internal Medicine

## 2011-10-20 ENCOUNTER — Ambulatory Visit: Payer: BC Managed Care – PPO | Admitting: Internal Medicine

## 2011-12-09 ENCOUNTER — Other Ambulatory Visit: Payer: Self-pay | Admitting: Internal Medicine

## 2011-12-21 ENCOUNTER — Other Ambulatory Visit: Payer: Self-pay | Admitting: Family Medicine

## 2011-12-21 MED ORDER — SILDENAFIL CITRATE 100 MG PO TABS: ORAL_TABLET | ORAL | Status: DC

## 2012-01-20 ENCOUNTER — Ambulatory Visit (INDEPENDENT_AMBULATORY_CARE_PROVIDER_SITE_OTHER): Payer: BC Managed Care – PPO | Admitting: Sports Medicine

## 2012-01-20 VITALS — BP 140/84 | Ht 70.0 in | Wt 204.0 lb

## 2012-01-20 DIAGNOSIS — M25519 Pain in unspecified shoulder: Secondary | ICD-10-CM

## 2012-01-20 DIAGNOSIS — M19019 Primary osteoarthritis, unspecified shoulder: Secondary | ICD-10-CM

## 2012-01-20 MED ORDER — AMITRIPTYLINE HCL 25 MG PO TABS: 25.0000 mg | ORAL_TABLET | Freq: Every day | ORAL | Status: DC

## 2012-01-20 NOTE — Progress Notes (Signed)
  Subjective:    Patient ID: Peter Bruce, male    DOB: 22-Jun-1950, 61 y.o.   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   Review of Systems     Objective:   Physical Exam   no acute distress  Neck range of motion is good with a negative Spurling since Neuro check is normal  Full range of motion of the both shoulders  Right shoulder shows a positive empty can and a positive Hawkins test Good internal and external rotation strength Good strength on bicipital testing with negative speeds and Yergason's Clunk Test and O'Briens tests are negative  Crossover test is positive  There is some atrophy in the supraspinatous fossa on the right  [facility] ultrasound There is a calcific deposit at the insertion of the supraspinatous Lateral edges of the supraspinatous tendon appear to be retracted and flattened No rotator cuff tear is visualized at this time  There is some mild increase in fluid in the bursal Impingement testing shows that he gets subacromial impingement   Subscapularis infraspinatus and teres minor are all normal  A.c. joint is arthritic   Review of cervical spine films from chiropractor's office reveals significant anterior spurring in the lower cervical spine      Assessment & Plan:

## 2012-01-20 NOTE — Assessment & Plan Note (Signed)
I suspect Peter Bruce had a supraspinatous tear that has now healed with some atrophy  He needs to begin a series of rehabilitation exercises with very light weight for the rotator cuff  avoid lifting anything heavy over head  Because of his pain level given a short course of prednisone 20 mg twice a day x7 days and hopefully this will lessen some of the bursal swelling  Trial of amitriptyline 25 mg at night for muscle relaxation and improved sleep

## 2012-01-20 NOTE — Assessment & Plan Note (Signed)
I do not think this is his primary source of pain although he does hurt significantly if he reaches across his body  Consider injection of the joint if this pain persists  Recheck 4-6 weeks after he begins his exercise program

## 2012-01-20 NOTE — Patient Instructions (Addendum)
Thanks for coming today.  For 1 week take 2 prednisone twice a day, and then go back to normal dose.  Talk to Dr. Merla Riches about eventually reducing or eliminating that medicine.  Continue your shoulder exercises we talked about.  1) Spokes of the wheel 2) Biceps curls 3) Internal and external rotation.  You can take amitriptyline at night for sleep and muscle spasm. Come on back in 6 weeks or so.

## 2012-01-21 ENCOUNTER — Other Ambulatory Visit: Payer: Self-pay | Admitting: *Deleted

## 2012-01-21 DIAGNOSIS — M797 Fibromyalgia: Secondary | ICD-10-CM

## 2012-01-21 MED ORDER — PREDNISONE 10 MG PO TABS: 10.0000 mg | ORAL_TABLET | Freq: Every day | ORAL | Status: DC

## 2012-02-23 ENCOUNTER — Encounter: Payer: Self-pay | Admitting: Internal Medicine

## 2012-02-23 ENCOUNTER — Ambulatory Visit (INDEPENDENT_AMBULATORY_CARE_PROVIDER_SITE_OTHER): Payer: BC Managed Care – PPO | Admitting: Internal Medicine

## 2012-02-23 VITALS — BP 148/98 | HR 89 | Temp 98.2°F | Resp 16 | Ht 68.0 in | Wt 204.2 lb

## 2012-02-23 DIAGNOSIS — G8929 Other chronic pain: Secondary | ICD-10-CM

## 2012-02-23 DIAGNOSIS — G4700 Insomnia, unspecified: Secondary | ICD-10-CM

## 2012-02-23 DIAGNOSIS — F411 Generalized anxiety disorder: Secondary | ICD-10-CM

## 2012-02-23 DIAGNOSIS — N529 Male erectile dysfunction, unspecified: Secondary | ICD-10-CM

## 2012-02-23 DIAGNOSIS — R739 Hyperglycemia, unspecified: Secondary | ICD-10-CM

## 2012-02-23 DIAGNOSIS — G4710 Hypersomnia, unspecified: Secondary | ICD-10-CM

## 2012-02-23 DIAGNOSIS — I10 Essential (primary) hypertension: Secondary | ICD-10-CM

## 2012-02-23 DIAGNOSIS — F419 Anxiety disorder, unspecified: Secondary | ICD-10-CM

## 2012-02-23 DIAGNOSIS — R7309 Other abnormal glucose: Secondary | ICD-10-CM

## 2012-02-23 DIAGNOSIS — Z6831 Body mass index (BMI) 31.0-31.9, adult: Secondary | ICD-10-CM

## 2012-02-23 DIAGNOSIS — E669 Obesity, unspecified: Secondary | ICD-10-CM | POA: Insufficient documentation

## 2012-02-23 LAB — POCT GLYCOSYLATED HEMOGLOBIN (HGB A1C): Hemoglobin A1C: 5.4

## 2012-02-23 MED ORDER — TEMAZEPAM 30 MG PO CAPS: ORAL_CAPSULE | ORAL | Status: DC

## 2012-02-23 MED ORDER — HYDROCHLOROTHIAZIDE 12.5 MG PO CAPS: 12.5000 mg | ORAL_CAPSULE | Freq: Every day | ORAL | Status: DC

## 2012-02-23 MED ORDER — AMPHETAMINE-DEXTROAMPHETAMINE 10 MG PO TABS: 10.0000 mg | ORAL_TABLET | Freq: Every day | ORAL | Status: DC

## 2012-02-23 MED ORDER — AMLODIPINE BESYLATE 10 MG PO TABS: ORAL_TABLET | ORAL | Status: DC

## 2012-02-23 MED ORDER — ALPRAZOLAM 0.25 MG PO TABS: 0.2500 mg | ORAL_TABLET | ORAL | Status: DC

## 2012-02-23 MED ORDER — ZOLPIDEM TARTRATE 10 MG PO TABS: ORAL_TABLET | ORAL | Status: DC

## 2012-02-23 MED ORDER — SILDENAFIL CITRATE 100 MG PO TABS: 100.0000 mg | ORAL_TABLET | Freq: Every day | ORAL | Status: DC | PRN

## 2012-02-23 MED ORDER — CLONAZEPAM 0.5 MG PO TABS: 0.5000 mg | ORAL_TABLET | Freq: Two times a day (BID) | ORAL | Status: DC | PRN

## 2012-02-23 NOTE — Progress Notes (Signed)
Subjective:    Patient ID: Peter Bruce, male    DOB: October 08, 1950, 61 y.o.   MRN: [id number]  HPI Here for followup for medication refill and Labs Patient Active Problem List  Diagnosis  . HYPERTENSION  . PULMONARY EMBOLISM  . OTHER ACUTE SINUSITIS  . NEPHROLITHIASIS  . DEGENERATIVE JOINT DISEASE  . FIBROMYALGIA  . COUGH  . NONSPECIFIC ABNORMAL TOXICOLOGICAL FINDINGS  . Anxiety  . BPH (benign prostatic hyperplasia)  . ED (erectile dysfunction)  . Atrial fibrillation, controlled  . Hypersomnolence  . Chronic pain  . Insomnia  . Shoulder pain  . Acromioclavicular joint arthritis  . BMI 31.0-31.9,adult   See Recent note from Dr. Patsi Sears regarding concern for glycosuria Recent sports medicine visit: Shoulder pain - Enid Baas, MD 01/20/2012 2:48 PM Signed  I suspect Mr. Dec had a supraspinatous tear that has now healed with some atrophy  He needs to begin a series of rehabilitation exercises with very light weight for the rotator cuff  avoid lifting anything heavy over head  Because of his pain level given a short course of prednisone 20 mg twice a day x7 days and hopefully this will lessen some of the bursal swelling  Trial of amitriptyline 25 mg at night for muscle relaxation and improved sleep Acromioclavicular joint arthritis - Enid Baas, MD 01/20/2012 2:49 PM Signed  I do not think this is his primary source of pain although he does hurt significantly if he reaches across his body  Consider injection of the joint if this pain persists  Recheck 4-6 weeks after he begins his exercise program  He does have lots of aches and pains He has a lot of trouble with control anxiety and with ability to sleep He continues to need medication for both/sometimes at high doses but without lingering side effects.   Review of Systems No cardiac or pulmonary symptoms recently He continues to work very hard despite his diagnosis fibromyalgia with lots of particular joint and muscle  issues His outside blood pressure readings remained stable He has not been able to lose weight   Continues to need Viagra for erectile dysfunction/recent prostate exam by Dr. Patsi Sears within normal limits Objective:   Physical Exam Filed Vitals:   02/23/12 1204  BP: 148/98  Pulse: 89  Temp: 98.2 F (36.8 C)  Resp: 16   BMI 31 HEENT clear Heart regular without murmur/No edema/good peripheral pulses/No carotid bruits Lungs are clear Mood and affect are stable/appropriate      Results for orders placed in visit on 02/23/12  POCT GLYCOSYLATED HEMOGLOBIN (HGB A1C)      Component Value Range   Hemoglobin A1C 5.4      Assessment & Plan:  Problem #1 glycosuria Reassured with normal hemoglobin A1c Problem #2 anxiety with insomnia Problem #3 hypertension Problem #4 fibromyalgia Problem#5 shoulder pain Problem #6 degenerative joint disease multiple areas Problem 7 anxiety disorder Problem #8 erectile dysfunction Problem #9 hypersomnolence-Dr. Patsi Sears started him on the small dose of Adderall several years ago and it has been very effective with preventing work related hypersomnolence  We discussed possible new job in [employer] and cons with his health record ov  Meds ordered this encounter  Medications  . clonazePAM (KLONOPIN) 0.5 MG tablet    Sig: Take 1 tablet (0.5 mg total) by mouth 2 (two) times daily as needed (for neuropathic pain).    Dispense:  60 tablet    Refill:  5  . ALPRAZolam (XANAX) 0.25 MG tablet  Sig: Take 1 tablet (0.25 mg total) by mouth 1 day or 1 dose. For anxiety or dysphagia    Dispense:  30 tablet    Refill:  5  . hydrochlorothiazide (MICROZIDE) 12.5 MG capsule    Sig: Take 1 capsule (12.5 mg total) by mouth daily.    Dispense:  90 capsule    Refill:  1  . amLODipine (NORVASC) 10 MG tablet    Sig: One and one-half daily    Dispense:  135 tablet    Refill:  1  . zolpidem (AMBIEN) 10 MG tablet    Sig: 1 at bedtime and 1/2  when wakes at 3am    Dispense:  45 tablet    Refill:  5  . temazepam (RESTORIL) 30 MG capsule    Sig: 1 at 7pm to prepare for bed    Dispense:  30 capsule    Refill:  5  . sildenafil (VIAGRA) 100 MG tablet    Sig: Take 1 tablet (100 mg total) by mouth daily as needed for erectile dysfunction.    Dispense:  10 tablet    Refill:  0  . amphetamine-dextroamphetamine (ADDERALL) 10 MG tablet    Sig: Take 1 tablet (10 mg total) by mouth daily.    Dispense:  60 tablet    Refill:  0  . amitriptyline (ELAVIL) 25 MG tablet    Sig:    Followup 3-6 months/to provide outside labs

## 2012-02-28 ENCOUNTER — Encounter: Payer: Self-pay | Admitting: Internal Medicine

## 2012-03-02 ENCOUNTER — Encounter: Payer: Self-pay | Admitting: Sports Medicine

## 2012-03-02 ENCOUNTER — Ambulatory Visit (INDEPENDENT_AMBULATORY_CARE_PROVIDER_SITE_OTHER): Payer: BC Managed Care – PPO | Admitting: Sports Medicine

## 2012-03-02 VITALS — HR 83 | Ht 68.0 in | Wt 200.0 lb

## 2012-03-02 DIAGNOSIS — M19019 Primary osteoarthritis, unspecified shoulder: Secondary | ICD-10-CM

## 2012-03-02 DIAGNOSIS — M25519 Pain in unspecified shoulder: Secondary | ICD-10-CM

## 2012-03-02 NOTE — Assessment & Plan Note (Signed)
He has made remarkable improvement with his shoulder symptoms  I think he should continue the rehabilitation for the next 4-6 weeks before adding any heavy weights  He uses prednisone for chronic musculoskeletal problems and for his diagnosis of fibromyalgia  I cautioned him about using too much of this and encouraged him to coordinate the prednisone usage with his primary care doctor --Dr. Merla Riches  Plan return prn

## 2012-03-02 NOTE — Patient Instructions (Addendum)
For progression of exercise:  1 - When comfortable with 3 sets of 15 reps with rehabilitation exercises, then you may progress to normal heavy lifts at 50% of maximal strength

## 2012-03-02 NOTE — Progress Notes (Signed)
Patient ID: Peter Bruce, male   DOB: [birth year], 61 y.o.   MRN: [id number] SUBJECTIVE: Peter Bruce is a 61 y.o. male who presents for f/u of RIGHT shoulder pain.  Pt noted to have right supraspinatus calcium deposits, suspected rotator cuff tear and AC joint arthritis at last office visit in early September.  Since then he has not been using any of the amitriptyline at night secondary to fatigue/somnolence in morning after first few doses.  He completed short course of prednisone and reports improved symptoms since that time.  Pt has been performing rotator cuff rehabilitation exercises since last office visit and made great progress.    Pt reports 75% improvement (25% from pre-injury baseline) in pain and approx 50% improvement in overall strength/function.  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  Pt has been avoiding all overhead activity.  Reports reaching fatigue on rep 13, 14 of second set of 15 in most exercises now - completes third set in 3 small sets of 5 reps each.    Continued popping from [facility] joint.  Denies any catching, clicking or instability when rehabbing or while at work.   OBJECTIVE: Vital signs are within normal limits. Appearance: patient in no acute distress, resting comfortably in room. Shoulder exam: Patient was able to remove shirt without significant pain or distress, exhibiting good motion in b/l shoulders. Pt has no obvious asymmetry, edema or erythema of bilateral shoulders on inspection. Full, symmetric AROM in flexion, extension, ER and abduction of b/l shoulders.  IR limited to lower thoracic on RIGHT whereas left reached to mid thoracic. 5/5 strength in flexion, extension, abduction, IR, ER and supraspinatus testing.  No pain with any movements. NEGATIVE neer's and kennedy-hawkins NEGATIVE speed's and yergeuson's NEGATIVE full can, empty can NEGATIVE  O'Brien's test bilaterally  Negative cross-arm test, though some clicking heard  No repeat imaging or new imaging since last office visit   ASSESSMENT: RIGHT shoulder pain, secondary to supraspinatus tear - recovering AC Joint arthritis - stable   PLAN: Pt history and physical exam suggestive of healing right supraspinatus injury, appears to be prior tear with some super-imposed impingement per record review.  Pt now pain free with unremarkable special tests on exam.  Strength is improved and his exercise tolerance improving as well.  Plan to continue shoulder rehab as noted in patient instructions.  Once he can comfortably complete 3 sets of 15 in all rotator cuff exercises, will progress to lifting at 50% of pre-injury strength.    Pt will call in if still having pain, symptoms and discuss treatment options and/or need for f/u appointment if that need arises.  -- Peter Houseman, MS IV Plan:  Note prednisone in A/P for call in referral

## 2012-03-02 NOTE — Assessment & Plan Note (Signed)
This was not painful on testing today

## 2012-03-04 ENCOUNTER — Other Ambulatory Visit: Payer: Self-pay

## 2012-03-04 DIAGNOSIS — I10 Essential (primary) hypertension: Secondary | ICD-10-CM

## 2012-03-04 MED ORDER — AMLODIPINE BESYLATE 10 MG PO TABS: ORAL_TABLET | ORAL | Status: DC

## 2012-03-06 ENCOUNTER — Other Ambulatory Visit: Payer: Self-pay | Admitting: Internal Medicine

## 2012-03-21 ENCOUNTER — Other Ambulatory Visit: Payer: Self-pay | Admitting: Internal Medicine

## 2012-04-12 ENCOUNTER — Telehealth: Payer: Self-pay | Admitting: *Deleted

## 2012-04-12 NOTE — Telephone Encounter (Signed)
Left pt a VM that per Dr. Darrick Penna and last office visit note- he was advised to coordinate prednisone refills with PCP- Dr. Merla Riches.

## 2012-04-12 NOTE — Telephone Encounter (Signed)
Message copied by Mora Bellman on Wed Apr 12, 2012  3:50 PM ------      Message from: CERESI, MELANIE L      Created: Wed Apr 12, 2012  9:17 AM      Regarding: refill request      Contact: [phone]       Pt is requesting a refill on Prednisone 10mg . Pt states shoulder is about 80% better.      pharmacy is [facility]

## 2012-05-10 DIAGNOSIS — Z0271 Encounter for disability determination: Secondary | ICD-10-CM

## 2012-07-19 ENCOUNTER — Encounter: Payer: BC Managed Care – PPO | Admitting: Internal Medicine

## 2012-12-22 ENCOUNTER — Ambulatory Visit: Payer: BC Managed Care – PPO

## 2012-12-22 ENCOUNTER — Ambulatory Visit (INDEPENDENT_AMBULATORY_CARE_PROVIDER_SITE_OTHER): Payer: BC Managed Care – PPO | Admitting: Internal Medicine

## 2012-12-22 VITALS — BP 152/92 | HR 94 | Temp 98.5°F | Resp 18 | Ht 70.0 in | Wt 203.0 lb

## 2012-12-22 DIAGNOSIS — F411 Generalized anxiety disorder: Secondary | ICD-10-CM

## 2012-12-22 DIAGNOSIS — I10 Essential (primary) hypertension: Secondary | ICD-10-CM

## 2012-12-22 DIAGNOSIS — G4700 Insomnia, unspecified: Secondary | ICD-10-CM

## 2012-12-22 DIAGNOSIS — F419 Anxiety disorder, unspecified: Secondary | ICD-10-CM

## 2012-12-22 DIAGNOSIS — G8929 Other chronic pain: Secondary | ICD-10-CM

## 2012-12-22 DIAGNOSIS — E785 Hyperlipidemia, unspecified: Secondary | ICD-10-CM

## 2012-12-22 DIAGNOSIS — S40019A Contusion of unspecified shoulder, initial encounter: Secondary | ICD-10-CM

## 2012-12-22 DIAGNOSIS — M25579 Pain in unspecified ankle and joints of unspecified foot: Secondary | ICD-10-CM

## 2012-12-22 DIAGNOSIS — M25571 Pain in right ankle and joints of right foot: Secondary | ICD-10-CM

## 2012-12-22 DIAGNOSIS — N529 Male erectile dysfunction, unspecified: Secondary | ICD-10-CM

## 2012-12-22 DIAGNOSIS — IMO0002 Reserved for concepts with insufficient information to code with codable children: Secondary | ICD-10-CM

## 2012-12-22 DIAGNOSIS — M797 Fibromyalgia: Secondary | ICD-10-CM

## 2012-12-22 DIAGNOSIS — G4710 Hypersomnia, unspecified: Secondary | ICD-10-CM

## 2012-12-22 DIAGNOSIS — IMO0001 Reserved for inherently not codable concepts without codable children: Secondary | ICD-10-CM

## 2012-12-22 LAB — POCT CBC
Granulocyte percent: 75.1 %G (ref 37–80)
MCV: 97 fL (ref 80–97)
MID (cbc): 0.7 (ref 0–0.9)
MPV: 10 fL (ref 0–99.8)
POC Granulocyte: 8 — AB (ref 2–6.9)
POC LYMPH PERCENT: 18.1 %L (ref 10–50)
Platelet Count, POC: 250 10*3/uL (ref 142–424)
RBC: 5.2 M/uL (ref 4.69–6.13)
RDW, POC: 13.2 %

## 2012-12-22 LAB — LIPID PANEL
HDL: 50 mg/dL (ref >39)
LDL Cholesterol: 148 mg/dL — ABNORMAL HIGH (ref 0–99)
Total CHOL/HDL Ratio: 4.4 Ratio
Triglycerides: 99 mg/dL (ref <150)
VLDL: 20 mg/dL (ref 0–40)

## 2012-12-22 LAB — COMPREHENSIVE METABOLIC PANEL
Alkaline Phosphatase: 65 U/L (ref 39–117)
BUN: 25 mg/dL — ABNORMAL HIGH (ref 6–23)
Creat: 1.09 mg/dL (ref 0.50–1.35)
Glucose, Bld: 106 mg/dL — ABNORMAL HIGH (ref 70–99)
Total Bilirubin: 0.6 mg/dL (ref 0.3–1.2)

## 2012-12-22 MED ORDER — CLONAZEPAM 0.5 MG PO TABS: 0.5000 mg | ORAL_TABLET | Freq: Two times a day (BID) | ORAL | Status: DC | PRN

## 2012-12-22 MED ORDER — HYDROCHLOROTHIAZIDE 12.5 MG PO CAPS: 12.5000 mg | ORAL_CAPSULE | Freq: Every day | ORAL | Status: DC

## 2012-12-22 MED ORDER — TEMAZEPAM 30 MG PO CAPS: ORAL_CAPSULE | ORAL | Status: DC

## 2012-12-22 MED ORDER — SILDENAFIL CITRATE 100 MG PO TABS: 100.0000 mg | ORAL_TABLET | Freq: Every day | ORAL | Status: DC | PRN

## 2012-12-22 MED ORDER — AMPHETAMINE-DEXTROAMPHETAMINE 10 MG PO TABS: 10.0000 mg | ORAL_TABLET | Freq: Every day | ORAL | Status: DC

## 2012-12-22 MED ORDER — AMLODIPINE BESYLATE 10 MG PO TABS: ORAL_TABLET | ORAL | Status: DC

## 2012-12-22 MED ORDER — KETOROLAC TROMETHAMINE 60 MG/2ML IM SOLN
60.0000 mg | Freq: Once | INTRAMUSCULAR | Status: AC
  Administered 2012-12-22: 60 mg via INTRAMUSCULAR

## 2012-12-22 MED ORDER — ZOLPIDEM TARTRATE 10 MG PO TABS: ORAL_TABLET | ORAL | Status: DC

## 2012-12-22 MED ORDER — ALPRAZOLAM 0.25 MG PO TABS: 0.2500 mg | ORAL_TABLET | ORAL | Status: DC

## 2012-12-22 NOTE — Progress Notes (Addendum)
Subjective:    Patient ID: Peter Bruce, male    DOB: 1951-02-07, 62 y.o.   MRN: [id number]  HPIrecent fall from ladder-handed on left side injuring left shoulder with abrasion left knee-2 weeks ago Later in the day his right foot started hurting and has continued to be a problem with weightbearing He had swelling around the right knee without abrasion in the right ankle without sprain but these injuries have resolved over the last 2 weeks.  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 Hypertension   . Arthritis   . Neuromuscular disorder    Past Surgical History  Procedure Laterality Date  . Appendectomy    . Hernia repair    . Shoulder surgery    . Arm surgery         Review of Systems  Constitutional: Negative for fever, chills, activity change, appetite change and unexpected weight change.  HENT: Negative for hearing loss, trouble swallowing and neck pain.   Eyes: Negative for visual disturbance.  Respiratory: Negative for cough, chest tightness, shortness of breath and wheezing.   Cardiovascular: Negative for chest pain, palpitations and leg swelling.       Status post pulmonary embolism/status post atrial fibrillation  Gastrointestinal: Negative for abdominal pain.   Endocrine: Negative for polyuria.  Genitourinary: Negative for urgency and frequency.       BPH doing well/ED stable  Musculoskeletal: Positive for myalgias and arthralgias.       Diagnosis of fibromyalgia in the past with lots of chronic arthralgias and myalgias probably related to the physical demands of his work schedule  Skin: Negative for rash.  Neurological: Negative for light-headedness and headaches.  Psychiatric/Behavioral: Negative for behavioral problems, decreased concentration and agitation.       Anxiety has been well controlled  under the circumstances///insomnia responding to medication       Objective:   Physical Exam BP 152/92  Pulse 94  Temp(Src) 98.5 F (36.9 C) (Oral)  Resp 18  Ht 5\' 10"  (1.778 m)  Wt 203 lb (92.08 kg)  BMI 29.13 kg/m2  SpO2 97% No acute distress other than mildly uncomfortable Well-nourished well-developed HEENT clear Heart regular without murmur/regular rhythm Lungs clear Left shoulder with tenderness on range of motion but no swelling or weakness Left knee abrasion healing/full range of motion Right knee exam within normal limits Right ankle stable to exam Achilles intact Very tender with pressure on the calcaneus Dorsum of the foot normal nontender No swelling noted within the joints Cranial nerves II through XII intact/DTRs symmetrical/gait antalgic due to and right heel pain/cerebellar intact  [facility] reading (PRIMARY) by  Dr. Josephina Gip fx seen  Toradol injection given as requested overall level of discomfort following fall    Assessment & Plan:  Contusion, shoulder /upper arm -we'll continue to resolve without treatment  Fibromyalgia with Chronic pain -long-standing diagnosis  Pain in joint, ankle and foot, right -status post fall from ladder   HTN (hypertension)   - Plan: hydrochlorothiazide (MICROZIDE) 12.5 MG capsule, amLODipine (NORVASC) 10 MG   Insomnia - Plan: temazepam (RESTORIL) 30 MG capsule, zolpidem (AMBIEN)  10 MG tablet  He will continue to alternate these medications as needed which has been successful for several years  ED (erectile dysfunction)   - Plan: sildenafil (VIAGRA) 100 MG tablet  Anxiety   - Plan: ALPRAZolam (XANAX) 0.25 MG tablet prn(uses 1/2 tabs)  Hypersomnolence   - Plan: amphetamine-dextroamphetamine (ADDERALL) 10 MG tablet/this was started by Dr. Patsi Sears and has been successful with preventing driving-related problems due to hypersomnolence for suspected underlying sleep  disorder secondary to pain  Meds ordered this encounter  Medications  . ketorolac (TORADOL) injection 60 mg    Sig:   . hydrochlorothiazide (MICROZIDE) 12.5 MG capsule    Sig: Take 1 capsule (12.5 mg total) by mouth daily.    Dispense:  90 capsule    Refill:  1  . temazepam (RESTORIL) 30 MG capsule    Sig: 1 at 7pm to prepare for bed    Dispense:  30 capsule    Refill:  5  . zolpidem (AMBIEN) 10 MG tablet    Sig: 1 at bedtime and 1/2 when wakes at 3am    Dispense:  45 tablet    Refill:  5  . sildenafil (VIAGRA) 100 MG tablet    Sig: Take 1 tablet (100 mg total) by mouth daily as needed for erectile dysfunction.    Dispense:  10 tablet    Refill:  0  . amLODipine (NORVASC) 10 MG tablet    Sig: One and one-half daily    Dispense:  135 tablet    Refill:  1  . ALPRAZolam (XANAX) 0.25 MG tablet    Sig: Take 1 tablet (0.25 mg total) by mouth 1 day or 1 dose. For anxiety or dysphagia    Dispense:  30 tablet    Refill:  5  . clonazePAM (KLONOPIN) 0.5 MG tablet    Sig: Take 1 tablet (0.5 mg total) by mouth 2 (two) times daily as needed (for neuropathic pain).    Dispense:  60 tablet    Refill:  5  . amphetamine-dextroamphetamine (ADDERALL) 10 MG tablet    Sig: Take 1 tablet (10 mg total) by mouth daily.    Dispense:  60 tablet    Refill:  0   Notify lab results Heel pad until well  50 minute office visit    PSA 3.09= the only one on record LDL 148-we'll start Lipitor 20

## 2012-12-23 ENCOUNTER — Encounter: Payer: Self-pay | Admitting: Internal Medicine

## 2012-12-23 MED ORDER — ATORVASTATIN CALCIUM 20 MG PO TABS: 20.0000 mg | ORAL_TABLET | Freq: Every day | ORAL | Status: DC

## 2012-12-23 NOTE — Addendum Note (Signed)
Addended by: Tonye Pearson on: 12/23/2012 04:31 PM   Modules accepted: Orders

## 2013-01-03 ENCOUNTER — Emergency Department (HOSPITAL_COMMUNITY): Payer: BC Managed Care – PPO

## 2013-01-03 ENCOUNTER — Encounter (HOSPITAL_COMMUNITY): Payer: Self-pay | Admitting: Emergency Medicine

## 2013-01-03 ENCOUNTER — Emergency Department (HOSPITAL_COMMUNITY)
Admission: EM | Admit: 2013-01-03 | Discharge: 2013-01-03 | Disposition: A | Discharge status: Home / Self Care | Payer: BC Managed Care – PPO | Attending: Emergency Medicine | Admitting: Emergency Medicine

## 2013-01-03 DIAGNOSIS — Z86711 Personal history of pulmonary embolism: Secondary | ICD-10-CM | POA: Insufficient documentation

## 2013-01-03 DIAGNOSIS — R269 Unspecified abnormalities of gait and mobility: Secondary | ICD-10-CM | POA: Insufficient documentation

## 2013-01-03 DIAGNOSIS — Z8669 Personal history of other diseases of the nervous system and sense organs: Secondary | ICD-10-CM | POA: Insufficient documentation

## 2013-01-03 DIAGNOSIS — M545 Low back pain, unspecified: Secondary | ICD-10-CM | POA: Insufficient documentation

## 2013-01-03 DIAGNOSIS — Z8739 Personal history of other diseases of the musculoskeletal system and connective tissue: Secondary | ICD-10-CM | POA: Insufficient documentation

## 2013-01-03 DIAGNOSIS — M7989 Other specified soft tissue disorders: Secondary | ICD-10-CM

## 2013-01-03 DIAGNOSIS — M25473 Effusion, unspecified ankle: Secondary | ICD-10-CM | POA: Insufficient documentation

## 2013-01-03 DIAGNOSIS — M25476 Effusion, unspecified foot: Secondary | ICD-10-CM | POA: Insufficient documentation

## 2013-01-03 DIAGNOSIS — IMO0002 Reserved for concepts with insufficient information to code with codable children: Secondary | ICD-10-CM | POA: Insufficient documentation

## 2013-01-03 DIAGNOSIS — Z79899 Other long term (current) drug therapy: Secondary | ICD-10-CM | POA: Insufficient documentation

## 2013-01-03 DIAGNOSIS — R110 Nausea: Secondary | ICD-10-CM | POA: Insufficient documentation

## 2013-01-03 DIAGNOSIS — M25579 Pain in unspecified ankle and joints of unspecified foot: Secondary | ICD-10-CM | POA: Insufficient documentation

## 2013-01-03 DIAGNOSIS — I10 Essential (primary) hypertension: Secondary | ICD-10-CM | POA: Insufficient documentation

## 2013-01-03 LAB — URINALYSIS, ROUTINE W REFLEX MICROSCOPIC
Glucose, UA: NEGATIVE mg/dL
Hgb urine dipstick: NEGATIVE
Specific Gravity, Urine: 1.022 (ref 1.005–1.030)
Urobilinogen, UA: 0.2 mg/dL (ref 0.0–1.0)
pH: 6 (ref 5.0–8.0)

## 2013-01-03 MED ORDER — ONDANSETRON 4 MG PO TBDP
8.0000 mg | ORAL_TABLET | Freq: Once | ORAL | Status: AC
  Administered 2013-01-03: 8 mg via ORAL
  Filled 2013-01-03: qty 2

## 2013-01-03 MED ORDER — FENTANYL CITRATE 0.05 MG/ML IJ SOLN
25.0000 ug | Freq: Once | INTRAMUSCULAR | Status: AC
  Administered 2013-01-03: 25 ug via INTRAMUSCULAR
  Filled 2013-01-03: qty 2

## 2013-01-03 MED ORDER — TRAMADOL HCL 50 MG PO TABS: 50.0000 mg | ORAL_TABLET | Freq: Four times a day (QID) | ORAL | Status: DC | PRN

## 2013-01-03 MED ORDER — PREDNISONE 20 MG PO TABS: 40.0000 mg | ORAL_TABLET | Freq: Every day | ORAL | Status: DC

## 2013-01-03 MED ORDER — FENTANYL CITRATE 0.05 MG/ML IJ SOLN: 25.0000 ug | Freq: Once | INTRAMUSCULAR | Status: DC

## 2013-01-03 MED ORDER — HYDROMORPHONE HCL PF 1 MG/ML IJ SOLN
1.0000 mg | Freq: Once | INTRAMUSCULAR | Status: AC
  Administered 2013-01-03: 1 mg via INTRAMUSCULAR
  Filled 2013-01-03: qty 1

## 2013-01-03 NOTE — ED Notes (Signed)
Patient transported to Ultrasound 

## 2013-01-03 NOTE — ED Provider Notes (Signed)
Medical screening examination/treatment/procedure(s) were conducted as a shared visit with non-physician practitioner(s) and myself.  I personally evaluated the patient during the encounter  I interviewed and examined the pt. Lungs CTAB. Abdomen soft. Cardiac exam wnl. Suspect pt's pain to be msk. Will recommend continued rest, ice, and better pain control.   Junius Argyle, MD 01/03/13 [phone]

## 2013-01-03 NOTE — ED Notes (Signed)
Patient transported to X-ray 

## 2013-01-03 NOTE — ED Provider Notes (Signed)
CSN: [id number]     Arrival date & time 01/03/13  0840 History   First MD Initiated Contact with Patient 01/03/13 [phone]     Chief Complaint  Patient presents with  . Back Pain  . Foot Pain   (Consider location/radiation/quality/duration/timing/severity/associated sxs/prior Treatment) HPI Comments: Patient is a 56 are all melena history of pulmonary embolism x 2 in 2010, hypertension and arthritis who presents for right sided low back pain x2 weeks, onset after falling off a ladder at home. Patient states the symptoms have been gradually worsening since onset at about alleviating factors. He tried chiropractic manipulation with temporary improvement. Patient states that pain is worse with movement and weightbearing. Patient usually ambulates without assistance at baseline but has been needing to use a cane to go from a seated to standing position and while ambulating for stability. He has tried ibuprofen for her symptoms without relief. Patient saw his primary care provider on 12/22/2012 for symptoms; he was given a shot of Toradol at this time which, patient states, did not help his pain. Patient denies lower extremity numbness/tingling, saddle anesthesia, genital numbness, or bowel/bladder incontinence. Patient has also noticed pain and swelling in his R foot that has been worsening over the last few days; denies alleviating factors and states pain also worse when bearing weight. Had Xray done for symptoms which showed no acute fracture. Patient denies pallor, erythema, claudication, and calf tenderness. Patient also denies use of blood thinners.  The history is provided by the patient. No language interpreter was used.    Past Medical History  Diagnosis Date  . Hypertension   . Arthritis   . Neuromuscular disorder    Past Surgical History  Procedure Laterality Date  . Appendectomy    . Hernia repair    . Shoulder surgery    . Arm surgery      Family History  Problem Relation Age of Onset   . Cancer Mother    History  Substance Use Topics  . Smoking status: Never Smoker   . Smokeless tobacco: Never Used  . Alcohol Use: No    Review of Systems  Constitutional: Negative for fever.  Cardiovascular: Positive for leg swelling (R foot). Negative for chest pain.  Gastrointestinal: Positive for nausea (with worsening pain in back). Negative for vomiting and abdominal pain.  Genitourinary: Negative for dysuria and hematuria.       No incontinence  Musculoskeletal: Positive for back pain, arthralgias and gait problem.  Skin: Negative for color change and pallor.  Neurological: Negative for weakness and numbness.  All other systems reviewed and are negative.    Allergies  Amitriptyline  Home Medications   Current Outpatient Rx  Name  Route  Sig  Dispense  Refill  . ALPRAZolam (XANAX) 0.25 MG tablet   Oral   Take 1 tablet (0.25 mg total) by mouth 1 day or 1 dose. For anxiety or dysphagia   30 tablet   5   . amLODipine (NORVASC) 10 MG tablet      One and one-half daily   135 tablet   1   . amphetamine-dextroamphetamine (ADDERALL) 10 MG tablet   Oral   Take 1 tablet (10 mg total) by mouth daily.   60 tablet   0   . atorvastatin (LIPITOR) 20 MG tablet   Oral   Take 1 tablet (20 mg total) by mouth daily.   90 tablet   3   . b complex vitamins tablet   Oral  Take 1 tablet by mouth daily.         . clonazePAM (KLONOPIN) 0.5 MG tablet   Oral   Take 1 tablet (0.5 mg total) by mouth 2 (two) times daily as needed (for neuropathic pain).   60 tablet   5   . hydrochlorothiazide (MICROZIDE) 12.5 MG capsule   Oral   Take 1 capsule (12.5 mg total) by mouth daily.   90 capsule   1   . predniSONE (DELTASONE) 10 MG tablet   Oral   Take 1 tablet (10 mg total) by mouth daily.   30 tablet   1   . sildenafil (VIAGRA) 100 MG tablet   Oral   Take 1 tablet (100 mg total) by mouth daily as needed for erectile dysfunction.   10 tablet   0   . temazepam  (RESTORIL) 30 MG capsule      1 at 7pm to prepare for bed   30 capsule   5   . zolpidem (AMBIEN) 10 MG tablet      1 at bedtime and 1/2 when wakes at 3am   45 tablet   5   . predniSONE (DELTASONE) 20 MG tablet   Oral   Take 2 tablets (40 mg total) by mouth daily.   10 tablet   0   . traMADol (ULTRAM) 50 MG tablet   Oral   Take 1 tablet (50 mg total) by mouth every 6 (six) hours as needed for pain.   17 tablet   0    BP 132/72  Pulse 96  Temp(Src) 97.9 F (36.6 C) (Oral)  Resp 15  SpO2 92%  Physical Exam  Nursing note and vitals reviewed. Constitutional: He is oriented to person, place, and time. He appears well-developed and well-nourished. No distress.  HENT:  Head: Normocephalic and atraumatic.  Eyes: Conjunctivae and EOM are normal. No scleral icterus.  Neck: Normal range of motion.  Cardiovascular: Normal rate, regular rhythm and normal heart sounds.   Distal radial and dorsalis pedis pulses 2+ bilaterally. Capillary refill normal.  Pulmonary/Chest: Effort normal and breath sounds normal. No respiratory distress. He has no wheezes. He has no rales.  Genitourinary: Rectum normal. Rectal exam shows anal tone normal.  No abnormal tone on chaperoned rectal exam  Musculoskeletal: He exhibits no tenderness.  Range of motion of back decrease secondary to pain and discomfort. No tenderness to palpation of the lumbosacral midline. No tenderness to palpation of the lumbosacral paraspinal muscles. No spasm appreciated.  Neurological: He is alert and oriented to person, place, and time. He has normal reflexes.  No sensory or motor deficits appreciated. DTRs normal and symmetric.  Skin: Skin is warm and dry. No rash noted. He is not diaphoretic. No pallor.  RLE warmer than LLE; no erythema or pallor noted.  Psychiatric: He has a normal mood and affect. His behavior is normal.    ED Course  Procedures (including critical care time) Labs Review Labs Reviewed  URINALYSIS,  ROUTINE W REFLEX MICROSCOPIC - Abnormal; Notable for the following:    Ketones, ur 15 (*)    All other components within normal limits  OCCULT BLOOD X 1 CARD TO LAB, STOOL   Imaging Review Dg Lumbar Spine Complete  01/03/2013   *RADIOLOGY REPORT*  Clinical Data: Status post fall a few weeks ago with pain developing over the past 2 days.  The patient is unable to units there is right leg.  LUMBAR SPINE - COMPLETE 4+ VIEW  Comparison: None.  Findings: There is mild convex left scoliosis with the apex at L1- 2.  The patient has multilevel degenerative disc disease appearing worst at L2-3 where there is also trace retrolisthesis.  Trace anterolisthesis of L4 on L5 is noted.  Multilevel facet degenerative change is present.  IMPRESSION:  1.  No acute finding. 2.  Multilevel degenerative disease.   Original Report Authenticated By: Holley Dexter, M.D.   *PRELIMINARY RESULTS*  Vascular Ultrasound  Right lower extremity venous duplex has been completed. Preliminary findings: negative for DVT. Right Baker's cyst noted.  Farrel Demark, RDMS, RVT  01/03/2013, 11:20 AM   MDM   1. Low back pain    Patient presents for worsening low back pain after a fall off a ladder approximately from 5 feet off the ground. Patient well and nontoxic appearing, hemodynamically stable, and afebrile. Patient is neurovascularly intact on physical exam. There is no tenderness to palpation of the lumbosacral midline or bony deformities or step-offs. Ultrasound of right lower extremity completed in light of foot swelling and patient's history of pulmonary embolism. Vascular ultrasound negative for DVT. X-ray of lumbar spine also completed in light of fall which is negative for acute fracture or dislocation; multilevel degenerative disease appreciated. Urine without evidence of infection or hematuria. Rectal tone also normal on chaperoned rectal exam. There are no red flags were signs of cauda equina to warrant further workup with  emergent MRI. Symptoms most consistent with low back strain secondary to mechanical fall. Patient appropriate for discharge with primary care followup for further evaluation of symptoms. Prednisone and Ultram prescribed for pain and ice advised to the effected area. Orthopedic referral given should symptoms not improve with recommended treatment. Indications for ED return provided and patient agreeable to plan with no unaddressed concerns.  Patient seen also by Dr. Purvis Sheffield who is in agreement with this workup, assessment, management plan, and patient's stability for discharge.    Antony Madura, PA-C 01/03/13 1351

## 2013-01-03 NOTE — Progress Notes (Signed)
*  PRELIMINARY RESULTS* Vascular Ultrasound Right lower extremity venous duplex has been completed.  Preliminary findings: negative for DVT. Right Baker's cyst noted.  Farrel Demark, RDMS, RVT  01/03/2013, 11:20 AM

## 2013-01-03 NOTE — ED Notes (Signed)
Family at bedside. 

## 2013-01-03 NOTE — ED Notes (Signed)
Patient says, his back hurts and when he tries to move his right leg, it just hangs there.  If he wants to move his right leg, he has to move it himself.  The patient said he is not having pain in his leg, just discomfort and swelling.  No bowel or urinary incontinence.

## 2013-01-03 NOTE — ED Notes (Signed)
Pt sts a couple weeks ago he fell off a ladder, approx 5 ft. Mainly landed on left shoulder, then 3 hrs later he felt it was difficult to breathe. Went to a [place], felt a lot better. Then a week later went to the chiropractor again felt fine still. Then 2 days after that he started to experience pain in right foot, lower back. sts the past week he has been limping around, the past couple days has gotten more painful and hasn't been able to get out of bed. Pt in nad, skin warm and dry, reps e/u.

## 2013-01-03 NOTE — ED Notes (Signed)
Pt able to ambulate but with some difficulty. Has to help pick up right leg d/t the pain. Reports he does have a walker at home he can use.

## 2013-01-22 ENCOUNTER — Ambulatory Visit (INDEPENDENT_AMBULATORY_CARE_PROVIDER_SITE_OTHER): Payer: BC Managed Care – PPO | Admitting: Physician Assistant

## 2013-01-22 VITALS — BP 160/98 | HR 84 | Temp 98.8°F | Resp 18 | Ht 68.0 in | Wt 193.6 lb

## 2013-01-22 DIAGNOSIS — Z23 Encounter for immunization: Secondary | ICD-10-CM

## 2013-01-22 DIAGNOSIS — M79671 Pain in right foot: Secondary | ICD-10-CM

## 2013-01-22 DIAGNOSIS — M79609 Pain in unspecified limb: Secondary | ICD-10-CM

## 2013-01-22 LAB — URIC ACID: Uric Acid, Serum: 5.7 mg/dL (ref 4.0–7.8)

## 2013-01-22 NOTE — Progress Notes (Signed)
  Subjective:    Patient ID: AYUSH BOULET, male    DOB: 09-12-1950, 62 y.o.   MRN: [id number]  HPI This 62 y.o. male presents for uric acid level, at the request of Dr. Charlsie Merles.  Has been having pain and swelling in the RIGHT foot since prior to 12/22/2012.  Pain began after a fall from a ladder (sustained injury to the LEFT shoulder and knee, swelling in the RIGHT knee and questionable injury to the RIGHT ankle). Seen here, and Xray of the foot was normal.  He was then seen in the ED 01/03/2013 with persistent pain.  Given his history of back pain,   Saw Dr. Charlsie Merles, received an injection in the heel.  Several days ago developed recurrent swelling of the forefoot. Called Dr. Charlsie Merles, who advised a uric acid level. Review of Systems As above.    Objective:   Physical Exam Blood pressure 160/98, pulse 84, temperature 98.8 F (37.1 C), temperature source Oral, resp. rate 18, height 5\' 8"  (1.727 m), weight 193 lb 9.6 oz (87.816 kg), SpO2 99.00%. Body mass index is 29.44 kg/(m^2). Well-developed, well nourished WM who is awake, alert and oriented, in NAD. HEENT: [place]/AT, sclera and conjunctiva are clear.   Heart: Regular Rate Lungs: normal effort Extremities: no cyanosis, clubbing or edema. Skin: warm and dry without rash. Psychologic: good mood and appropriate affect, normal speech and behavior.     Assessment & Plan:  Need for prophylactic vaccination and inoculation against influenza - Plan: Flu Vaccine QUAD 36+ mos IM  Foot pain, right - Plan: Uric Acid Will send lab results to Dr. Charlsie Merles.  Patient also requests a copy for Dr. Patsi Sears.  Fernande Bras, PA-C Physician Assistant-Certified [facility]

## 2013-01-22 NOTE — Patient Instructions (Addendum)
Follow-up with Dr. Charlsie Merles regarding the foot pain and swelling. I will send the lab results to him and Dr. Patsi Sears.

## 2013-02-27 ENCOUNTER — Ambulatory Visit (INDEPENDENT_AMBULATORY_CARE_PROVIDER_SITE_OTHER): Payer: BC Managed Care – PPO | Admitting: Podiatry

## 2013-02-27 ENCOUNTER — Encounter: Payer: Self-pay | Admitting: Podiatry

## 2013-02-27 VITALS — BP 146/86 | HR 79 | Resp 16 | Ht 70.0 in | Wt 195.0 lb

## 2013-02-27 DIAGNOSIS — M779 Enthesopathy, unspecified: Secondary | ICD-10-CM

## 2013-02-27 NOTE — Patient Instructions (Signed)

## 2013-02-28 NOTE — Progress Notes (Signed)
Subjective:     Patient ID: Peter Bruce, male   DOB: Sep 12, 1950, 62 y.o.   MRN: [id number]  HPI patient states my heel is doing well been getting pain on the inside and outside of both feet   Review of Systems  All other systems reviewed and are negative.       Objective:   Physical Exam  Nursing note and vitals reviewed. Cardiovascular: Intact distal pulses.   Musculoskeletal: Normal range of motion.  Neurological: He is alert.  Skin: Skin is warm.   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 Advised patient to call immediately if he should develop acute inflammation and if not we'll see in 6 weeks

## 2013-04-03 ENCOUNTER — Ambulatory Visit (INDEPENDENT_AMBULATORY_CARE_PROVIDER_SITE_OTHER): Payer: BC Managed Care – PPO | Admitting: Podiatry

## 2013-04-03 ENCOUNTER — Encounter: Payer: Self-pay | Admitting: Podiatry

## 2013-04-03 VITALS — BP 137/93 | HR 65 | Resp 16 | Ht 70.0 in | Wt 200.0 lb

## 2013-04-03 DIAGNOSIS — M722 Plantar fascial fibromatosis: Secondary | ICD-10-CM

## 2013-04-03 MED ORDER — TRIAMCINOLONE ACETONIDE 10 MG/ML IJ SUSP: 10.0000 mg | Freq: Once | INTRAMUSCULAR | Status: DC

## 2013-04-04 NOTE — Progress Notes (Signed)
Subjective:     Patient ID: Peter Bruce, male   DOB: [birth year], 62 y.o.   MRN: [id number]  HPI patient is found to have improving condition right heel with no swelling actual heel bone pain but continued pain in the plantar aspect of the heel   Review of Systems     Objective:   Physical Exam Neurovascular status intact with inflammation discomfort still noted of a moderate nature plantar left fascia    Assessment:     Plantar fasciitis improving but still present right    Plan:     Advised on continuation of orthotics ice and night splint. Injected the right plantar fascia 3 mg Kenalog 5 mg Xylocaine Marcaine mixture and advised on reduced activity for the next several weeks

## 2013-06-13 ENCOUNTER — Encounter: Payer: Self-pay | Admitting: Internal Medicine

## 2013-06-13 ENCOUNTER — Ambulatory Visit (INDEPENDENT_AMBULATORY_CARE_PROVIDER_SITE_OTHER): Payer: BC Managed Care – PPO | Admitting: Internal Medicine

## 2013-06-13 VITALS — BP 164/100 | HR 71 | Temp 98.4°F | Resp 16 | Ht 67.0 in | Wt 199.4 lb

## 2013-06-13 DIAGNOSIS — N529 Male erectile dysfunction, unspecified: Secondary | ICD-10-CM

## 2013-06-13 DIAGNOSIS — N400 Enlarged prostate without lower urinary tract symptoms: Secondary | ICD-10-CM

## 2013-06-13 DIAGNOSIS — M25519 Pain in unspecified shoulder: Secondary | ICD-10-CM

## 2013-06-13 DIAGNOSIS — G4710 Hypersomnia, unspecified: Secondary | ICD-10-CM

## 2013-06-13 DIAGNOSIS — IMO0001 Reserved for inherently not codable concepts without codable children: Secondary | ICD-10-CM

## 2013-06-13 DIAGNOSIS — G4700 Insomnia, unspecified: Secondary | ICD-10-CM

## 2013-06-13 DIAGNOSIS — I10 Essential (primary) hypertension: Secondary | ICD-10-CM

## 2013-06-13 DIAGNOSIS — F411 Generalized anxiety disorder: Secondary | ICD-10-CM

## 2013-06-13 DIAGNOSIS — G8929 Other chronic pain: Secondary | ICD-10-CM

## 2013-06-13 DIAGNOSIS — I4891 Unspecified atrial fibrillation: Secondary | ICD-10-CM

## 2013-06-13 DIAGNOSIS — F419 Anxiety disorder, unspecified: Secondary | ICD-10-CM

## 2013-06-13 DIAGNOSIS — M19019 Primary osteoarthritis, unspecified shoulder: Secondary | ICD-10-CM

## 2013-06-13 MED ORDER — AMLODIPINE BESYLATE 10 MG PO TABS: ORAL_TABLET | ORAL | Status: DC

## 2013-06-13 MED ORDER — AMPHETAMINE-DEXTROAMPHETAMINE 10 MG PO TABS: 10.0000 mg | ORAL_TABLET | Freq: Every day | ORAL | Status: DC

## 2013-06-13 MED ORDER — SILDENAFIL CITRATE 100 MG PO TABS: 100.0000 mg | ORAL_TABLET | Freq: Every day | ORAL | Status: DC | PRN

## 2013-06-13 MED ORDER — TEMAZEPAM 30 MG PO CAPS: ORAL_CAPSULE | ORAL | Status: DC

## 2013-06-13 MED ORDER — ALPRAZOLAM 0.25 MG PO TABS: 0.2500 mg | ORAL_TABLET | ORAL | Status: DC

## 2013-06-13 MED ORDER — PREDNISONE 20 MG PO TABS: ORAL_TABLET | ORAL | Status: DC

## 2013-06-13 MED ORDER — CLONAZEPAM 0.5 MG PO TABS: 0.5000 mg | ORAL_TABLET | Freq: Two times a day (BID) | ORAL | Status: DC | PRN

## 2013-06-13 MED ORDER — ZOLPIDEM TARTRATE 10 MG PO TABS: ORAL_TABLET | ORAL | Status: DC

## 2013-06-13 MED ORDER — HYDROCHLOROTHIAZIDE 12.5 MG PO CAPS: 12.5000 mg | ORAL_CAPSULE | Freq: Every day | ORAL | Status: DC

## 2013-06-13 NOTE — Progress Notes (Signed)
Subjective:    Patient ID: Peter Bruce, male    DOB: December 10, 1950, 63 y.o.   MRN: [id number]  HPI here for followup and medication refills Also complaining of significant shoulder pain bilaterally Would like injections in both shoulders History of surgery on the left several years ago History of pain on the right after falling 6 months ago Pain now affects activity with some weakness of grip in the right hand/both shoulders are difficult to use overhead/both shoulders wake him at night   Patient Active Problem List   Diagnosis Date Noted  . BMI 31.0-31.9,adult 02/23/2012  . Shoulder pain 01/20/2012  . Acromioclavicular joint arthritis 01/20/2012  . BPH (benign prostatic hyperplasia) 08/30/2011  . ED (erectile dysfunction) 08/30/2011  . Atrial fibrillation, controlled 08/30/2011  . Hypersomnolence with marked insomnia  08/30/2011  . Chronic pain 08/30/2011  . Insomnia 08/30/2011  . 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 OTHER ACUTE SINUSITIS--- not current  01/01/2009  . COUGH----not current  12/25/2008  .    Marland Kitchen HYPERTENSION----brought in a list of home blood pressures for the last 3 months all of which are normal  10/02/2008  . PULMONARY EMBOLISM--resolved  10/02/2008  . NEPHROLITHIASIS 10/02/2008  . DEGENERATIVE JOINT DISEASE 10/02/2008  . FIBROMYALGIA--- still a significant issue overall but continues to work  10/02/2008    Review of Systems  Constitutional: Negative for fever, appetite change and unexpected weight change.  HENT: Negative for hearing loss.   Eyes: Negative for photophobia and visual disturbance.  Respiratory: Negative for choking, chest tightness and shortness of breath.   Cardiovascular: Negative for chest pain, palpitations and leg swelling.  Gastrointestinal: Negative for nausea, vomiting, abdominal pain, diarrhea and constipation.  Genitourinary: Negative for difficulty  urinating.  Neurological: Negative for light-headedness and headaches.  Hematological: Negative for adenopathy. Does not bruise/bleed easily.       Objective:   Physical Exam  Constitutional: He is oriented to person, place, and time. He appears well-developed and well-nourished. No distress.  HENT:  Right Ear: External ear normal.  Left Ear: External ear normal.  Mouth/Throat: Oropharynx is clear and moist.  Eyes: Conjunctivae and EOM are normal. Pupils are equal, round, and reactive to light.  Neck: Normal range of motion. Neck supple. No thyromegaly present.  Cardiovascular: Normal rate, regular rhythm, normal heart sounds and intact distal pulses.   No murmur heard. Pulmonary/Chest: Effort normal and breath sounds normal. No respiratory distress.  Musculoskeletal: He exhibits no edema.  Both shoulders have abduction limited by pain particularly with resistance External rotation is fair Abduction good Elevation above 90 and possible Right grip equals left Finger to thumb opposition intact without other sensory loss tinels r negative  Lymphadenopathy:    He has no cervical adenopathy.  Neurological: He is alert and oriented to person, place, and time. No cranial nerve deficit.  Skin: No rash noted.  Psychiatric: He has a normal mood and affect. His behavior is normal. Judgment and thought content normal.    BP 164/100  Pulse 71  Temp(Src) 98.4 F (36.9 C) (Oral)  Resp 16  Ht 5\' 7"  (1.702 m)  Wt 199 lb 6.4 oz (90.447 kg)  BMI 31.22 kg/m2  SpO2 97% Note home blood pressures      Assessment & Plan:  FIBROMYALGIA--- he would like to be on prednisone low dose basis to treat this as it always works very well/I cautioned him once again about the  use of chronic steroid medication and its dangers affects  Anxiety - Plan: ALPRAZolam (XANAX) 0.25 MG tablet  BPH (benign prostatic hyperplasia)  Atrial fibrillation, controlled  Chronic pain - he is helped by being able to  sleep well for which he takes medication  Shoulder pain-bilateral/? New rotator cuff tear on the right  Will use prednisone to cover both shoulders and if he does not respond we'll set up orthopedic evaluation  Acromioclavicular joint arthritis  Hypersomnolence - Plan: amphetamine-dextroamphetamine (ADDERALL) 10 MG tablet--- uses when necessary for long driving. This was started by Dr. Gaynelle Arabian years ago and has been successful  Insomnia - Plan: zolpidem (AMBIEN) 10 MG tablet, temazepam (RESTORIL) 30 MG capsule-----his current regimen is working/note past history  HTN (hypertension) - Plan: hydrochlorothiazide (MICROZIDE) 12.5 MG capsule, amLODipine (NORVASC) 10 MG tablet  ED (erectile dysfunction) - Plan: sildenafil (VIAGRA) 100 MG tablet  Meds ordered this encounter  Medications  . DISCONTD: predniSONE (DELTASONE) 10 MG tablet    Sig:   . amphetamine-dextroamphetamine (ADDERALL) 10 MG tablet    Sig: Take 1 tablet (10 mg total) by mouth daily.    Dispense:  60 tablet    Refill:  0  . predniSONE (DELTASONE) 20 MG tablet    Sig: 4/4/3/3/2/2/1/1 single daily dose for 8 days    Dispense:  20 tablet    Refill:  0  . zolpidem (AMBIEN) 10 MG tablet    Sig: 1 at bedtime and 1/2 when wakes at 3am    Dispense:  45 tablet    Refill:  5  . hydrochlorothiazide (MICROZIDE) 12.5 MG capsule    Sig: Take 1 capsule (12.5 mg total) by mouth daily.    Dispense:  90 capsule    Refill:  1  . clonazePAM (KLONOPIN) 0.5 MG tablet    Sig: Take 1 tablet (0.5 mg total) by mouth 2 (two) times daily as needed (for neuropathic pain).    Dispense:  60 tablet    Refill:  5  . ALPRAZolam (XANAX) 0.25 MG tablet    Sig: Take 1 tablet (0.25 mg total) by mouth 1 day or 1 dose. For anxiety or dysphagia    Dispense:  30 tablet    Refill:  5  . amLODipine (NORVASC) 10 MG tablet    Sig: One and one-half daily    Dispense:  135 tablet    Refill:  1  . sildenafil (VIAGRA) 100 MG tablet    Sig: Take 1 tablet  (100 mg total) by mouth daily as needed for erectile dysfunction.    Dispense:  10 tablet    Refill:  0  . temazepam (RESTORIL) 30 MG capsule    Sig: 1 at 7pm to prepare for bed    Dispense:  30 capsule    Refill:  5

## 2013-08-13 ENCOUNTER — Ambulatory Visit: Payer: BC Managed Care – PPO

## 2013-08-13 ENCOUNTER — Ambulatory Visit (INDEPENDENT_AMBULATORY_CARE_PROVIDER_SITE_OTHER): Payer: BC Managed Care – PPO | Admitting: Family Medicine

## 2013-08-13 VITALS — BP 124/70 | HR 70 | Temp 98.4°F | Resp 16 | Ht 69.0 in | Wt 198.0 lb

## 2013-08-13 DIAGNOSIS — M19019 Primary osteoarthritis, unspecified shoulder: Secondary | ICD-10-CM

## 2013-08-13 DIAGNOSIS — M12819 Other specific arthropathies, not elsewhere classified, unspecified shoulder: Secondary | ICD-10-CM

## 2013-08-13 DIAGNOSIS — M25519 Pain in unspecified shoulder: Secondary | ICD-10-CM

## 2013-08-13 DIAGNOSIS — T148XXA Other injury of unspecified body region, initial encounter: Secondary | ICD-10-CM

## 2013-08-13 NOTE — Progress Notes (Signed)
Chief Complaint:  Chief Complaint  Patient presents with  . Shoulder Pain    left and right pain left is worse now     HPI: Peter Bruce is a 63 y.o. male who is here for reevaluation for his bialteral shoulders When he has to work overhead with a drill ( 18 lbs) he has prblems and his shoulder hurts and gets tired.  He does have some bruning and weakness in bilateral arms He works in Architect, Development worker, international aid.  He has a lot of sports injuries and has had inflammation all over.  At night he has pain if he lays on his shoulders. HE has deltoid pain Has taken flexeril and cannot take  Cannot take anything with SSRIs in them, puts him on the floor He was on prednisone 20 mg taper.   HE has seen Stefanie Libel for his shoulder , had xrays and also   Past Medical History  Diagnosis Date  . Hypertension   . Arthritis   . Neuromuscular disorder    Past Surgical History  Procedure Laterality Date  . Appendectomy    . Hernia repair    . Shoulder surgery    . Arm surgery      History   Social History  . Marital Status: Married    Spouse Name: N/A    Number of Children: N/A  . Years of Education: N/A   Occupational History  . licensed Chief Financial Officer     self-employed   Social History Main Topics  . Smoking status: Never Smoker   . Smokeless tobacco: Never Used  . Alcohol Use: No  . Drug Use: No  . Sexual Activity: Not on file   Other Topics Concern  . Not on file   Social History Narrative   Lives with his wife (married 1990).  Former Health visitor. Body building/weight lifting, running for exercise.   Family History  Problem Relation Age of Onset  . Cancer Mother    Allergies  Allergen Reactions  . Amitriptyline Shortness Of Breath   Prior to Admission medications   Medication Sig Start Date End Date Taking? Authorizing Provider  ALPRAZolam (XANAX) 0.25 MG tablet Take 1 tablet (0.25 mg total) by mouth 1 day or 1 dose. For  anxiety or dysphagia 06/13/13  Yes [name], MD  amLODipine (NORVASC) 10 MG tablet One and one-half daily 06/13/13  Yes [name], MD  amphetamine-dextroamphetamine (ADDERALL) 10 MG tablet Take 1 tablet (10 mg total) by mouth daily. 06/13/13  Yes [name], MD  b complex vitamins tablet Take 1 tablet by mouth daily.   Yes Historical Provider, MD  clonazePAM (KLONOPIN) 0.5 MG tablet Take 1 tablet (0.5 mg total) by mouth 2 (two) times daily as needed (for neuropathic pain). 06/13/13  Yes [name], MD  hydrochlorothiazide (MICROZIDE) 12.5 MG capsule Take 1 capsule (12.5 mg total) by mouth daily. 06/13/13  Yes [name], MD  sildenafil (VIAGRA) 100 MG tablet Take 1 tablet (100 mg total) by mouth daily as needed for erectile dysfunction. 06/13/13  Yes [name], MD  temazepam (RESTORIL) 30 MG capsule 1 at 7pm to prepare for bed 06/13/13  Yes [name], MD  zolpidem (AMBIEN) 10 MG tablet 1 at bedtime and 1/2 when wakes at 3am 06/13/13  Yes [name], MD  predniSONE (DELTASONE) 20 MG tablet 4/4/3/3/2/2/1/1 single daily dose for 8 days 06/13/13   [name],  MD     ROS: The patient denies fevers, chills, night sweats, unintentional weight loss, chest pain, palpitations, wheezing, dyspnea on exertion, nausea, vomiting, abdominal pain, dysuria, hematuria, melena, numbness, weakness, or tingling.   All other systems have been reviewed and were otherwise negative with the exception of those mentioned in the HPI and as above.    PHYSICAL EXAM: Filed Vitals:   08/13/13 1242  BP: 124/70  Pulse: 70  Temp: 98.4 F (36.9 C)  Resp: 16   Filed Vitals:   08/13/13 1242  Height: 5\' 9"  (1.753 m)  Weight: 198 lb (89.812 kg)   Body mass index is 29.23 kg/(m^2).  General: Alert, no acute distress HEENT:  Normocephalic, atraumatic, oropharynx patent. EOMI, PERRLA Cardiovascular:  Regular rate and rhythm, no rubs murmurs or gallops.  No Carotid  bruits, radial pulse intact. No pedal edema.  Respiratory: Clear to auscultation bilaterally.  No wheezes, rales, or rhonchi.  No cyanosis, no use of accessory musculature GI: No organomegaly, abdomen is soft and non-tender, positive bowel sounds.  No masses. Skin: No rashes. Neurologic: Facial musculature symmetric. Psychiatric: Patient is appropriate throughout our interaction. Lymphatic: No cervical lymphadenopathy Musculoskeletal: Gait intact. Neck exam-normal Bilateral shoulder-+ pain with IR and ER + right sided liftoff + hawkins/neers  Neg empty can No deformities, neg for speeds No AC jt tenderness, nl add/abd   LABS: Results for orders placed in visit on 01/22/13  URIC ACID      Result Value Ref Range   Uric Acid, Serum 5.7  4.0 - 7.8 mg/dL     EKG/XRAY:   Primary read interpreted by Dr. Marin Comment at [facility] + [facility], eg fx/dislocation   ASSESSMENT/PLAN: Encounter Diagnoses  Name Primary?  . Pain in joint, shoulder region Yes  . Rotator cuff arthropathy   . Sprain and strain    Rotator cuff tendonitis/impingement syndrome After much discussion, Peter Bruce would like to proceed with bilateral shoulder injections. No skin erythema or wound noted. Risks (including but not limited to bleeding and infection), benefits, and alternatives discussed for superolateral  Let and right shoulder injection. Verbal consent obtained after any questions were answered., and verbal understanding expressed. Landmarks noted, and marked as needed. Area cleansed with betadine,  followed by alcohol swab. Patient was injected with 4 ml of 1% plain lidocainecand 80 mg or 1 ml of depomedrol in each shoulder. No complications, tolerated procedure well. He will continue to take ibuprofen prn I would prefer him to see  ortho as needed since he keeps wanting to take prednisone which is not good longterm. If this does not help then I would like him to be referred to ortho/PT.  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 Patient is aware that all medications have potential sideeffects and we are unable to predict every sideeffect or drug-drug interaction that may occur.  Tazaria Dlugosz, [place], DO 08/13/2013 2:42 PM

## 2013-09-04 ENCOUNTER — Telehealth: Payer: Self-pay

## 2013-09-04 DIAGNOSIS — I10 Essential (primary) hypertension: Secondary | ICD-10-CM

## 2013-09-04 MED ORDER — AMLODIPINE BESYLATE 10 MG PO TABS: ORAL_TABLET | ORAL | Status: DC

## 2013-09-04 NOTE — Telephone Encounter (Signed)
[facility] faxed req for #135 amlodipine to be sent for pt. Dr Laney Pastor Rxd this at last OV but was set on "no print" instead of normal so did not send. I will send as written now.

## 2013-09-14 DIAGNOSIS — R5381 Other malaise: Secondary | ICD-10-CM | POA: Diagnosis not present

## 2013-09-14 DIAGNOSIS — M2550 Pain in unspecified joint: Secondary | ICD-10-CM | POA: Diagnosis not present

## 2013-09-14 DIAGNOSIS — IMO0001 Reserved for inherently not codable concepts without codable children: Secondary | ICD-10-CM | POA: Diagnosis not present

## 2013-09-14 DIAGNOSIS — M353 Polymyalgia rheumatica: Secondary | ICD-10-CM | POA: Diagnosis not present

## 2013-09-14 DIAGNOSIS — R5383 Other fatigue: Secondary | ICD-10-CM | POA: Diagnosis not present

## 2013-10-12 DIAGNOSIS — M353 Polymyalgia rheumatica: Secondary | ICD-10-CM | POA: Diagnosis not present

## 2013-10-12 DIAGNOSIS — M2550 Pain in unspecified joint: Secondary | ICD-10-CM | POA: Diagnosis not present

## 2013-10-12 DIAGNOSIS — R5381 Other malaise: Secondary | ICD-10-CM | POA: Diagnosis not present

## 2013-10-12 DIAGNOSIS — R5383 Other fatigue: Secondary | ICD-10-CM | POA: Diagnosis not present

## 2013-10-12 DIAGNOSIS — IMO0001 Reserved for inherently not codable concepts without codable children: Secondary | ICD-10-CM | POA: Diagnosis not present

## 2013-12-11 DIAGNOSIS — IMO0001 Reserved for inherently not codable concepts without codable children: Secondary | ICD-10-CM | POA: Diagnosis not present

## 2013-12-11 DIAGNOSIS — M2550 Pain in unspecified joint: Secondary | ICD-10-CM | POA: Diagnosis not present

## 2013-12-11 DIAGNOSIS — M353 Polymyalgia rheumatica: Secondary | ICD-10-CM | POA: Diagnosis not present

## 2013-12-11 DIAGNOSIS — R5381 Other malaise: Secondary | ICD-10-CM | POA: Diagnosis not present

## 2013-12-11 DIAGNOSIS — R5383 Other fatigue: Secondary | ICD-10-CM | POA: Diagnosis not present

## 2013-12-12 ENCOUNTER — Ambulatory Visit: Payer: BC Managed Care – PPO | Admitting: Internal Medicine

## 2013-12-19 ENCOUNTER — Ambulatory Visit (INDEPENDENT_AMBULATORY_CARE_PROVIDER_SITE_OTHER): Payer: Medicare Other | Admitting: Internal Medicine

## 2013-12-19 ENCOUNTER — Encounter: Payer: Self-pay | Admitting: Internal Medicine

## 2013-12-19 VITALS — BP 135/89 | HR 74 | Temp 98.6°F | Resp 16 | Ht 68.0 in | Wt 197.6 lb

## 2013-12-19 DIAGNOSIS — I4891 Unspecified atrial fibrillation: Secondary | ICD-10-CM

## 2013-12-19 DIAGNOSIS — F411 Generalized anxiety disorder: Secondary | ICD-10-CM | POA: Diagnosis not present

## 2013-12-19 DIAGNOSIS — Z23 Encounter for immunization: Secondary | ICD-10-CM | POA: Diagnosis not present

## 2013-12-19 DIAGNOSIS — N529 Male erectile dysfunction, unspecified: Secondary | ICD-10-CM

## 2013-12-19 DIAGNOSIS — I10 Essential (primary) hypertension: Secondary | ICD-10-CM

## 2013-12-19 DIAGNOSIS — G8929 Other chronic pain: Secondary | ICD-10-CM

## 2013-12-19 DIAGNOSIS — F419 Anxiety disorder, unspecified: Secondary | ICD-10-CM

## 2013-12-19 DIAGNOSIS — Z6831 Body mass index (BMI) 31.0-31.9, adult: Secondary | ICD-10-CM | POA: Diagnosis not present

## 2013-12-19 DIAGNOSIS — G4710 Hypersomnia, unspecified: Secondary | ICD-10-CM

## 2013-12-19 DIAGNOSIS — M353 Polymyalgia rheumatica: Secondary | ICD-10-CM | POA: Diagnosis not present

## 2013-12-19 DIAGNOSIS — G4700 Insomnia, unspecified: Secondary | ICD-10-CM

## 2013-12-19 DIAGNOSIS — N400 Enlarged prostate without lower urinary tract symptoms: Secondary | ICD-10-CM

## 2013-12-19 MED ORDER — ZOLPIDEM TARTRATE 10 MG PO TABS: ORAL_TABLET | ORAL | Status: DC

## 2013-12-19 MED ORDER — AMPHETAMINE-DEXTROAMPHETAMINE 10 MG PO TABS: 10.0000 mg | ORAL_TABLET | Freq: Every day | ORAL | Status: DC

## 2013-12-19 MED ORDER — HYDROCHLOROTHIAZIDE 12.5 MG PO CAPS: 12.5000 mg | ORAL_CAPSULE | Freq: Every day | ORAL | Status: DC

## 2013-12-19 MED ORDER — TEMAZEPAM 30 MG PO CAPS: ORAL_CAPSULE | ORAL | Status: DC

## 2013-12-19 MED ORDER — SILDENAFIL CITRATE 100 MG PO TABS: 100.0000 mg | ORAL_TABLET | Freq: Every day | ORAL | Status: DC | PRN

## 2013-12-19 MED ORDER — CLONAZEPAM 0.5 MG PO TABS: 0.5000 mg | ORAL_TABLET | Freq: Two times a day (BID) | ORAL | Status: DC | PRN

## 2013-12-19 MED ORDER — AMLODIPINE BESYLATE 10 MG PO TABS: ORAL_TABLET | ORAL | Status: DC

## 2013-12-19 MED ORDER — ALPRAZOLAM 0.25 MG PO TABS: 0.2500 mg | ORAL_TABLET | ORAL | Status: DC

## 2013-12-19 NOTE — Progress Notes (Addendum)
Subjective:   This chart was scribed for Peter Koyanagi, MD by Thea Alken, ED Scribe. This patient was seen in room 27 and the patient's care was started at 3:19 PM.   Patient ID: Peter Bruce, male    DOB: 08/19/1950, 63 y.o.   MRN: [id number]  Hypertension   Chief Complaint  Patient presents with  . Hypertension    6 month check up   HPI Comments: Peter Bruce is a 63 y.o. male who presents to the [facility] with a 6 month f/u. Pt reports he is doing well physically. Pt was seen by rheumatologist, Dr. Trudie Reed and diagnosed with polymyalgia rheumatica instead of fibromyalgia because not hx and response to Pred(esr wnl) . Pt was prescribed prednisone which has been working well.   Pt reports he is out of his medications. Pt would like to increase ambien medication by .5 tablets. Pt is not sleeping well and often wakes up in the middle of the night. Pt reports he is taking temazepam when he is not on the road working. He takes this medication about 7 PM and Ambien around bedtime 930 but wakes at 04-1229 needing another ambien to sleep again.. Pt reports adderall as need for driving purposes started several yrs ago by Dr Gaynelle Arabian due to trouble with long commutes after work. Never diagnosed with narcolepsy or sleep apnea. Has no daytime somnolence other than this late afternoon tract. No history of snoring.Marland Kitchen He reports he takes half of adderall in the afternoon for long distance driving to keep him awake and focused. Pt denies having a sleep study in the past. Pt denies side effects with any medication.  Pt is currently dealing with 2 deaths of 2 good friends. Pt cat has also past away and so he is felt depressed recently.   Patient Active Problem List   Diagnosis Date Noted  . Polymyalgia rheumatica--rheumatol hawks 2015 12/19/2013  . BMI 31.0-31.9,adult 02/23/2012  . Shoulder pain 01/20/2012  . Acromioclavicular joint arthritis 01/20/2012  . BPH (benign prostatic  hyperplasia) 08/30/2011  . ED (erectile dysfunction) 08/30/2011  . Atrial fibrillation, controlled 08/30/2011  . Hypersomnolence 08/30/2011  . Chronic pain 08/30/2011  . Insomnia 08/30/2011  . Anxiety 04/21/2011  . OTHER ACUTE SINUSITIS 01/01/2009  . COUGH 12/25/2008  . NONSPECIFIC ABNORMAL TOXICOLOGICAL FINDINGS 10/03/2008  . HYPERTENSION 10/02/2008  . PULMONARY EMBOLISM 10/02/2008  . NEPHROLITHIASIS 10/02/2008  . DEGENERATIVE JOINT DISEASE 10/02/2008  . FIBROMYALGIA 10/02/2008     Past Surgical History  Procedure Laterality Date  . Appendectomy    . Hernia repair    . Shoulder surgery    . Arm surgery      Prior to Admission medications   Medication Sig Start Date End Date Taking? Authorizing Provider  ALPRAZolam (XANAX) 0.25 MG tablet Take 1 tablet (0.25 mg total) by mouth 1 day or 1 dose. For anxiety or dysphagia 06/13/13  Yes Peter Koyanagi, MD  amLODipine (NORVASC) 10 MG tablet One and one-half daily 09/04/13  Yes Peter Koyanagi, MD  amphetamine-dextroamphetamine (ADDERALL) 10 MG tablet Take 1 tablet (10 mg total) by mouth daily. 06/13/13  Yes Peter Koyanagi, MD  b complex vitamins tablet Take 1 tablet by mouth daily.   Yes Historical Provider, MD  clonazePAM (KLONOPIN) 0.5 MG tablet Take 1 tablet (0.5 mg total) by mouth 2 (two) times daily as needed (for neuropathic pain). 06/13/13  Yes Peter Koyanagi, MD  hydrochlorothiazide (MICROZIDE) 12.5 MG capsule Take 1  capsule (12.5 mg total) by mouth daily. 06/13/13  Yes Peter Koyanagi, MD  predniSONE (DELTASONE) 10 MG tablet Take 10 mg by mouth daily with breakfast.   Yes Historical Provider, MD  sildenafil (VIAGRA) 100 MG tablet Take 1 tablet (100 mg total) by mouth daily as needed for erectile dysfunction. 06/13/13  Yes Peter Koyanagi, MD  temazepam (RESTORIL) 30 MG capsule 1 at 7pm to prepare for bed 06/13/13  Yes Peter Koyanagi, MD  zolpidem (AMBIEN) 10 MG tablet 1 at bedtime and 1/2 when wakes at 3am 06/13/13   Yes Peter Koyanagi, MD  predniSONE (DELTASONE) 20 MG tablet 4/4/3/3/2/2/1/1 single daily dose for 8 days 06/13/13   Peter Koyanagi, MD   Review of Systems No fever or night sweats No headaches or vision changes No weight loss or change in level of fatigue or activity No shortness of breath or cough No palpitations or chest pain No peripheral edema  No GI discomfort No GU complaints No swollen or red joints No paresthesias or weakness   Objective:   Physical Exam  Nursing note and vitals reviewed. Constitutional: He is oriented to person, place, and time. He appears well-developed and well-nourished. No distress.  HENT:  Head: Normocephalic and atraumatic.  Eyes: Conjunctivae and EOM are normal. Pupils are equal, round, and reactive to light.  Neck: Neck supple. No thyromegaly present.  Cardiovascular: Normal rate, regular rhythm and normal heart sounds.   Pulmonary/Chest: Effort normal.  Musculoskeletal: Normal range of motion. He exhibits no edema.  Lymphadenopathy:    He has no cervical adenopathy.  Neurological: He is alert and oriented to person, place, and time. He has normal reflexes. No cranial nerve deficit.  Skin: Skin is warm and dry.  Psychiatric: He has a normal mood and affect. His behavior is normal. Thought content normal.  BP 135/89  Pulse 74  Temp(Src) 98.6 F (37 C) (Oral)  Resp 16  Ht 5' 8"  (1.727 m)  Wt 197 lb 9.6 oz (89.631 kg)  BMI 30.05 kg/m2  SpO2 97%  Assessment & Plan:   1. Polymyalgia rheumatica--rheumatol hawks 2015   2. Anxiety --no current meds   3. Atrial fibrillation, controlled   4. BMI 31.0-31.9,adult   5. BPH (benign prostatic hyperplasia)   6. Erectile dysfunction, unspecified erectile dysfunction type --- Viagra works well /low testosterone   7. HYPERTENSION -stable   8. Hypersomnolence -he has arrived at an unconventional but successful approach   9. Insomnia --unconventional approach but successful    Meds ordered this  encounter  Medications  . predniSONE (DELTASONE) 10 MG tablet    Sig: Take 10 mg by mouth daily with breakfast.  . hydrochlorothiazide (MICROZIDE) 12.5 MG capsule    Sig: Take 1 capsule (12.5 mg total) by mouth daily.    Dispense:  90 capsule    Refill:  1  . amLODipine (NORVASC) 10 MG tablet    Sig: One and one-half daily    Dispense:  135 tablet    Refill:  1  . temazepam (RESTORIL) 30 MG capsule    Sig: 1 at 7pm to prepare for bed    Dispense:  30 capsule    Refill:  5  . zolpidem (AMBIEN) 10 MG tablet    Sig: 1 at bedtime and 1 when wakes at 3am    Dispense:  60 tablet    Refill:  5  . amphetamine-dextroamphetamine (ADDERALL) 10 MG tablet    Sig: Take 1 tablet (10 mg  total) by mouth daily.    Dispense:  30 tablet    Refill:  0  . amphetamine-dextroamphetamine (ADDERALL) 10 MG tablet    Sig: Take 1 tablet (10 mg total) by mouth daily. For 60d after signed    Dispense:  30 tablet    Refill:  0  . amphetamine-dextroamphetamine (ADDERALL) 10 MG tablet    Sig: Take 1 tablet (10 mg total) by mouth daily. For 30d after signed    Dispense:  30 tablet    Refill:  0  . sildenafil (VIAGRA) 100 MG tablet    Sig: Take 1 tablet (100 mg total) by mouth daily as needed for erectile dysfunction.    Dispense:  10 tablet    Refill:  11   Followup 6 months  I have completed the patient encounter in its entirety as documented by the scribe, with editing by me where necessary. Gentri Guardado P. Laney Pastor, M.D.  86mn OV

## 2013-12-25 ENCOUNTER — Telehealth: Payer: Self-pay

## 2013-12-25 NOTE — Telephone Encounter (Signed)
Ok to send it

## 2013-12-25 NOTE — Telephone Encounter (Signed)
Pharm faxed req to change pt to generic sildenafil which comes in 20 mg tablets, w/sig take 5 tabs QD prn. I have pended the generic and changed quantity to #50 since you originally Rxd #10. Please advise.

## 2013-12-28 MED ORDER — SILDENAFIL CITRATE 20 MG PO TABS: 20.0000 mg | ORAL_TABLET | Freq: Three times a day (TID) | ORAL | Status: DC

## 2014-01-01 ENCOUNTER — Other Ambulatory Visit: Payer: Self-pay

## 2014-01-01 MED ORDER — SILDENAFIL CITRATE 20 MG PO TABS: ORAL_TABLET | ORAL | Status: DC

## 2014-02-13 DIAGNOSIS — E291 Testicular hypofunction: Secondary | ICD-10-CM | POA: Diagnosis not present

## 2014-02-13 DIAGNOSIS — N401 Enlarged prostate with lower urinary tract symptoms: Secondary | ICD-10-CM | POA: Diagnosis not present

## 2014-02-13 DIAGNOSIS — R350 Frequency of micturition: Secondary | ICD-10-CM | POA: Diagnosis not present

## 2014-02-19 DIAGNOSIS — R5382 Chronic fatigue, unspecified: Secondary | ICD-10-CM | POA: Diagnosis not present

## 2014-02-19 DIAGNOSIS — Z7952 Long term (current) use of systemic steroids: Secondary | ICD-10-CM | POA: Diagnosis not present

## 2014-02-19 DIAGNOSIS — M353 Polymyalgia rheumatica: Secondary | ICD-10-CM | POA: Diagnosis not present

## 2014-02-19 DIAGNOSIS — Z23 Encounter for immunization: Secondary | ICD-10-CM | POA: Diagnosis not present

## 2014-02-19 DIAGNOSIS — M2550 Pain in unspecified joint: Secondary | ICD-10-CM | POA: Diagnosis not present

## 2014-04-18 ENCOUNTER — Telehealth: Payer: Self-pay | Admitting: Family Medicine

## 2014-04-18 NOTE — Telephone Encounter (Signed)
Left a voicemail for patient to come by and get the flu shot or let us know if he has had it so that we may update his file

## 2014-06-04 DIAGNOSIS — M2550 Pain in unspecified joint: Secondary | ICD-10-CM | POA: Diagnosis not present

## 2014-06-04 DIAGNOSIS — Z7952 Long term (current) use of systemic steroids: Secondary | ICD-10-CM | POA: Diagnosis not present

## 2014-06-04 DIAGNOSIS — M353 Polymyalgia rheumatica: Secondary | ICD-10-CM | POA: Diagnosis not present

## 2014-06-19 ENCOUNTER — Ambulatory Visit (INDEPENDENT_AMBULATORY_CARE_PROVIDER_SITE_OTHER): Payer: Medicare Other | Admitting: Internal Medicine

## 2014-06-19 ENCOUNTER — Encounter: Payer: Self-pay | Admitting: Internal Medicine

## 2014-06-19 VITALS — BP 138/89 | HR 94 | Temp 98.3°F | Resp 16 | Ht 68.0 in | Wt 195.0 lb

## 2014-06-19 DIAGNOSIS — I10 Essential (primary) hypertension: Secondary | ICD-10-CM | POA: Diagnosis not present

## 2014-06-19 DIAGNOSIS — G4710 Hypersomnia, unspecified: Secondary | ICD-10-CM | POA: Diagnosis not present

## 2014-06-19 DIAGNOSIS — F419 Anxiety disorder, unspecified: Secondary | ICD-10-CM | POA: Diagnosis not present

## 2014-06-19 DIAGNOSIS — E7800 Pure hypercholesterolemia, unspecified: Secondary | ICD-10-CM

## 2014-06-19 DIAGNOSIS — G8929 Other chronic pain: Secondary | ICD-10-CM | POA: Diagnosis not present

## 2014-06-19 DIAGNOSIS — G4700 Insomnia, unspecified: Secondary | ICD-10-CM | POA: Diagnosis not present

## 2014-06-19 MED ORDER — CLONAZEPAM 0.5 MG PO TABS: 0.5000 mg | ORAL_TABLET | Freq: Two times a day (BID) | ORAL | Status: DC | PRN

## 2014-06-19 MED ORDER — HYDROCHLOROTHIAZIDE 12.5 MG PO CAPS: 12.5000 mg | ORAL_CAPSULE | Freq: Every day | ORAL | Status: DC

## 2014-06-19 MED ORDER — TEMAZEPAM 30 MG PO CAPS: ORAL_CAPSULE | ORAL | Status: DC

## 2014-06-19 MED ORDER — AMPHETAMINE-DEXTROAMPHETAMINE 10 MG PO TABS: 10.0000 mg | ORAL_TABLET | Freq: Every day | ORAL | Status: DC

## 2014-06-19 MED ORDER — ALPRAZOLAM 0.25 MG PO TABS: 0.2500 mg | ORAL_TABLET | ORAL | Status: DC

## 2014-06-19 MED ORDER — ZOLPIDEM TARTRATE 10 MG PO TABS: ORAL_TABLET | ORAL | Status: DC

## 2014-06-19 MED ORDER — AMLODIPINE BESYLATE 10 MG PO TABS: ORAL_TABLET | ORAL | Status: DC

## 2014-06-19 MED ORDER — SILDENAFIL CITRATE 20 MG PO TABS: ORAL_TABLET | ORAL | Status: DC

## 2014-06-19 NOTE — Progress Notes (Signed)
[facility] [address] [place] [address] [phone]  Date:  06/19/2014   Name:  Peter Bruce   DOB:  1950/05/25   MRN:  [id number]  PCP:  [name], MD    Chief Complaint: Medication Refill   History of Present Illness:  Peter Bruce is a 64 y.o. very pleasant male patient with PMH listed below who presents for follow up.  Patient has no concerns or complaints at this time.   -He states that he is sleeping well.  He sleeps through the night with appropriate hours, and awakens refreshed.   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  He is working out 5 days per week.  He denies any joint pain or swelling.  He denies chest pains, dizziness, or lightheadedness, palpitations, or leg swelling.     Patient reports a flu vaccine.  He is scheduled for colonoscopy with Dr. Earlean Shawl next month.     Patient Active Problem List   Diagnosis Date Noted  . Polymyalgia rheumatica--rheumatol hawks 2015 12/19/2013  . BMI 31.0-31.9,adult 02/23/2012  . Shoulder pain 01/20/2012  . Acromioclavicular joint arthritis 01/20/2012  . BPH (benign prostatic hyperplasia) 08/30/2011  . ED (erectile dysfunction) 08/30/2011  . Atrial fibrillation, controlled 08/30/2011  . Hypersomnolence 08/30/2011  . Chronic pain 08/30/2011  . Insomnia 08/30/2011  . Anxiety 04/21/2011  . OTHER ACUTE SINUSITIS 01/01/2009  . COUGH 12/25/2008  . NONSPECIFIC ABNORMAL TOXICOLOGICAL FINDINGS 10/03/2008  . HYPERTENSION 10/02/2008  . PULMONARY EMBOLISM 10/02/2008  . NEPHROLITHIASIS 10/02/2008  . DEGENERATIVE JOINT DISEASE 10/02/2008  . FIBROMYALGIA 10/02/2008    Past Medical History  Diagnosis Date  . Hypertension   . Arthritis   . Neuromuscular disorder     Past Surgical History  Procedure Laterality Date  . Appendectomy    . Hernia repair    . Shoulder surgery    . Arm surgery        History  Substance Use Topics  . Smoking status: Never Smoker   . Smokeless tobacco: Never Used  . Alcohol Use: No    Family History  Problem Relation Age of Onset  . Cancer Mother     Allergies  Allergen Reactions  . Amitriptyline Shortness Of Breath    Medication list has been reviewed and updated.  Current Outpatient Prescriptions on File Prior to Visit  Medication Sig Dispense Refill  . ALPRAZolam (XANAX) 0.25 MG tablet Take 1 tablet (0.25 mg total) by mouth 1 day or 1 dose. For anxiety or dysphagia 30 tablet 5  . amLODipine (NORVASC) 10 MG tablet One and one-half daily 135 tablet 1  . amphetamine-dextroamphetamine (ADDERALL) 10 MG tablet Take 1 tablet (10 mg total) by mouth daily. 30 tablet 0  . amphetamine-dextroamphetamine (ADDERALL) 10 MG tablet Take 1 tablet (10 mg total) by mouth daily. For 60d after signed 30 tablet 0  . amphetamine-dextroamphetamine (ADDERALL) 10 MG tablet Take 1 tablet (10 mg total) by mouth daily. For 30d after signed 30 tablet 0  . b complex vitamins tablet Take 1 tablet by mouth daily.    . clonazePAM (KLONOPIN) 0.5 MG tablet Take 1 tablet (0.5 mg total) by mouth 2 (two) times daily as needed (for neuropathic pain). 60 tablet 5  . hydrochlorothiazide (MICROZIDE) 12.5 MG capsule Take 1 capsule (12.5 mg total) by mouth daily. 90 capsule 1  . predniSONE (DELTASONE) 10 MG tablet  Take 10 mg by mouth daily with breakfast.    . sildenafil (REVATIO) 20 MG tablet Take 5 tablets by mouth daily as needed for ED. 50 tablet 11  . sildenafil (VIAGRA) 100 MG tablet Take 1 tablet (100 mg total) by mouth daily as needed for erectile dysfunction. 10 tablet 11  . temazepam (RESTORIL) 30 MG capsule 1 at 7pm to prepare for bed 30 capsule 5  . zolpidem (AMBIEN) 10 MG tablet 1 at bedtime and 1 when wakes at 3am 60 tablet 5   No current facility-administered medications on file prior to visit.    Review of Systems: Not otherwise adding to this visit  Physical  Examination: Filed Vitals:   06/19/14 1309  BP: 138/89  Pulse: 94  Temp: 98.3 F (36.8 C)  Resp: 16   Filed Vitals:   06/19/14 1309  Height: 5\' 8"  (1.727 m)  Weight: 195 lb (88.451 kg)   Body mass index is 29.66 kg/(m^2). Ideal Body Weight: Weight in (lb) to have BMI = 25: 164.1  Wt Readings from Last 3 Encounters:  06/19/14 195 lb (88.451 kg)  12/19/13 197 lb 9.6 oz (89.631 kg)  08/13/13 198 lb (89.812 kg)   Physical Exam  Constitutional: He is oriented to person, place, and time. He appears well-developed and well-nourished.  HENT:  Head: Normocephalic and atraumatic.  Eyes: Conjunctivae are normal. Pupils are equal, round, and reactive to light.  Neck: Normal range of motion.  Cardiovascular: An irregularly irregular rhythm present. Exam reveals no friction rub.   No murmur heard. Pulses:      Dorsalis pedis pulses are 2+ on the right side, and 2+ on the left side.  Pulmonary/Chest: Effort normal and breath sounds normal. No respiratory distress. He has no wheezes.  Musculoskeletal: Normal range of motion. He exhibits edema (trace pitting edema). He exhibits no tenderness.  Neurological: He is alert and oriented to person, place, and time. He displays no atrophy. He exhibits normal muscle tone. Gait normal.  Skin: Skin is warm and dry.  Psychiatric: He has a normal mood and affect. His behavior is normal.    Assessment and Plan: 64 year old male is here today for a 6 month follow up.  He is stable and doing very well at this time.  Especially since the diagnosis of polymyalgia rheumatica in the treatment with prednisone by rheumatology Dr. Trudie Reed  Pure hypercholesterolemia - Plan: Lipid panel  Anxiety - Plan: ALPRAZolam (XANAX) 0.25 MG tablet  Essential hypertension - Plan: amLODipine (NORVASC) 10 MG tablet, hydrochlorothiazide (MICROZIDE) 12.5 MG capsule -Stable  Hypersomnolence - Plan: amphetamine-dextroamphetamine (ADDERALL) 10 MG tablet -Stable, continue  medication  Chronic pain - Plan: clonazePAM (KLONOPIN) 0.5 MG tablet -Stable, continue medication  Insomnia - Plan: zolpidem (AMBIEN) 10 MG tablet, temazepam (RESTORIL) 30 MG capsule -Stable, continue medication  Ivar Drape, PA-C [facility] 2/10/20169:23 PM  I have participated in the care of this patient with the Advanced Practice Provider and agree with Diagnosis and Plan as documented. Robert P. Laney Pastor, M.D.

## 2014-06-20 ENCOUNTER — Encounter: Payer: Self-pay | Admitting: Internal Medicine

## 2014-06-20 LAB — LIPID PANEL
CHOL/HDL RATIO: 3.1 ratio
Cholesterol: 191 mg/dL (ref 0–200)
HDL: 61 mg/dL (ref >39)
LDL CALC: 120 mg/dL — AB (ref 0–99)
TRIGLYCERIDES: 50 mg/dL (ref <150)
VLDL: 10 mg/dL (ref 0–40)

## 2014-06-26 ENCOUNTER — Ambulatory Visit: Payer: Medicare Other | Admitting: Internal Medicine

## 2014-07-31 DIAGNOSIS — Z7952 Long term (current) use of systemic steroids: Secondary | ICD-10-CM | POA: Diagnosis not present

## 2014-07-31 DIAGNOSIS — M353 Polymyalgia rheumatica: Secondary | ICD-10-CM | POA: Diagnosis not present

## 2014-07-31 DIAGNOSIS — M2550 Pain in unspecified joint: Secondary | ICD-10-CM | POA: Diagnosis not present

## 2014-10-08 DIAGNOSIS — M353 Polymyalgia rheumatica: Secondary | ICD-10-CM | POA: Diagnosis not present

## 2014-10-08 DIAGNOSIS — M2550 Pain in unspecified joint: Secondary | ICD-10-CM | POA: Diagnosis not present

## 2014-10-08 DIAGNOSIS — Z7952 Long term (current) use of systemic steroids: Secondary | ICD-10-CM | POA: Diagnosis not present

## 2014-11-02 ENCOUNTER — Telehealth: Payer: Self-pay | Admitting: Family Medicine

## 2014-11-02 NOTE — Telephone Encounter (Signed)
lmom for patient to call to reschedule his appt with doolittle he will not be at the appt cent on 12/18/14

## 2014-11-13 ENCOUNTER — Ambulatory Visit (INDEPENDENT_AMBULATORY_CARE_PROVIDER_SITE_OTHER): Payer: Medicare Other | Admitting: Internal Medicine

## 2014-11-13 ENCOUNTER — Encounter: Payer: Self-pay | Admitting: Internal Medicine

## 2014-11-13 VITALS — BP 170/100 | HR 90 | Temp 98.5°F | Resp 16 | Ht 68.0 in | Wt 199.0 lb

## 2014-11-13 DIAGNOSIS — M25511 Pain in right shoulder: Secondary | ICD-10-CM

## 2014-11-13 DIAGNOSIS — G8929 Other chronic pain: Secondary | ICD-10-CM | POA: Diagnosis not present

## 2014-11-13 DIAGNOSIS — M129 Arthropathy, unspecified: Secondary | ICD-10-CM

## 2014-11-13 DIAGNOSIS — R6889 Other general symptoms and signs: Secondary | ICD-10-CM | POA: Diagnosis not present

## 2014-11-13 DIAGNOSIS — R4584 Anhedonia: Secondary | ICD-10-CM | POA: Diagnosis not present

## 2014-11-13 DIAGNOSIS — M19019 Primary osteoarthritis, unspecified shoulder: Secondary | ICD-10-CM

## 2014-11-13 DIAGNOSIS — F419 Anxiety disorder, unspecified: Secondary | ICD-10-CM

## 2014-11-13 DIAGNOSIS — G4700 Insomnia, unspecified: Secondary | ICD-10-CM

## 2014-11-13 DIAGNOSIS — I10 Essential (primary) hypertension: Secondary | ICD-10-CM

## 2014-11-13 DIAGNOSIS — N529 Male erectile dysfunction, unspecified: Secondary | ICD-10-CM

## 2014-11-13 DIAGNOSIS — M25512 Pain in left shoulder: Secondary | ICD-10-CM

## 2014-11-13 DIAGNOSIS — G4710 Hypersomnia, unspecified: Secondary | ICD-10-CM | POA: Diagnosis not present

## 2014-11-13 LAB — COMPREHENSIVE METABOLIC PANEL
ALT: 40 U/L (ref 0–53)
AST: 29 U/L (ref 0–37)
Albumin: 4.2 g/dL (ref 3.5–5.2)
Alkaline Phosphatase: 60 U/L (ref 39–117)
BUN: 32 mg/dL — ABNORMAL HIGH (ref 6–23)
CALCIUM: 9.7 mg/dL (ref 8.4–10.5)
CHLORIDE: 104 meq/L (ref 96–112)
CO2: 25 mEq/L (ref 19–32)
CREATININE: 1.15 mg/dL (ref 0.50–1.35)
Glucose, Bld: 99 mg/dL (ref 70–99)
Potassium: 3.9 mEq/L (ref 3.5–5.3)
Sodium: 143 mEq/L (ref 135–145)
Total Bilirubin: 0.4 mg/dL (ref 0.2–1.2)
Total Protein: 7.3 g/dL (ref 6.0–8.3)

## 2014-11-13 LAB — CBC
HEMATOCRIT: 48.7 % (ref 39.0–52.0)
Hemoglobin: 17 g/dL (ref 13.0–17.0)
MCH: 31.4 pg (ref 26.0–34.0)
MCHC: 34.9 g/dL (ref 30.0–36.0)
MCV: 90 fL (ref 78.0–100.0)
MPV: 10.7 fL (ref 8.6–12.4)
Platelets: 223 10*3/uL (ref 150–400)
RBC: 5.41 MIL/uL (ref 4.22–5.81)
RDW: 13.9 % (ref 11.5–15.5)
WBC: 10.9 10*3/uL — AB (ref 4.0–10.5)

## 2014-11-13 LAB — LIPID PANEL
Cholesterol: 203 mg/dL — ABNORMAL HIGH (ref 0–200)
HDL: 54 mg/dL
LDL Cholesterol: 126 mg/dL — ABNORMAL HIGH (ref 0–99)
Total CHOL/HDL Ratio: 3.8 ratio
Triglycerides: 117 mg/dL
VLDL: 23 mg/dL (ref 0–40)

## 2014-11-13 LAB — TSH: TSH: 1.505 u[IU]/mL (ref 0.350–4.500)

## 2014-11-13 MED ORDER — AMPHETAMINE-DEXTROAMPHETAMINE 10 MG PO TABS: 10.0000 mg | ORAL_TABLET | Freq: Every day | ORAL | Status: DC

## 2014-11-13 MED ORDER — ZOLPIDEM TARTRATE 10 MG PO TABS: ORAL_TABLET | ORAL | Status: DC

## 2014-11-13 MED ORDER — CLONAZEPAM 0.5 MG PO TABS: 0.5000 mg | ORAL_TABLET | Freq: Two times a day (BID) | ORAL | Status: DC | PRN

## 2014-11-13 MED ORDER — AMLODIPINE BESYLATE 10 MG PO TABS: ORAL_TABLET | ORAL | Status: DC

## 2014-11-13 MED ORDER — SILDENAFIL CITRATE 20 MG PO TABS: ORAL_TABLET | ORAL | Status: DC

## 2014-11-13 MED ORDER — HYDROCHLOROTHIAZIDE 12.5 MG PO CAPS: 12.5000 mg | ORAL_CAPSULE | Freq: Every day | ORAL | Status: DC

## 2014-11-13 MED ORDER — TEMAZEPAM 30 MG PO CAPS: ORAL_CAPSULE | ORAL | Status: DC

## 2014-11-13 MED ORDER — ALPRAZOLAM 0.25 MG PO TABS: 0.2500 mg | ORAL_TABLET | ORAL | Status: DC

## 2014-11-13 NOTE — Patient Instructions (Signed)
Joint Injection  Care After  Refer to this sheet in the next few days. These instructions provide you with information on caring for yourself after you have had a joint injection. Your caregiver also may give you more specific instructions. Your treatment has been planned according to current medical practices, but problems sometimes occur. Call your caregiver if you have any problems or questions after your procedure.  After any type of joint injection, it is not uncommon to experience:  · Soreness, swelling, or bruising around the injection site.  · Mild numbness, tingling, or weakness around the injection site caused by the numbing medicine used before or with the injection.  It also is possible to experience the following effects associated with the specific agent after injection:  · Iodine-based contrast agents:  ¨ Allergic reaction (itching, hives, widespread redness, and swelling beyond the injection site).  · Corticosteroids (These effects are rare.):  ¨ Allergic reaction.  ¨ Increased blood sugar levels (If you have diabetes and you notice that your blood sugar levels have increased, notify your caregiver).  ¨ Increased blood pressure levels.  ¨ Mood swings.  · Hyaluronic acid in the use of viscosupplementation.  ¨ Temporary heat or redness.  ¨ Temporary rash and itching.  ¨ Increased fluid accumulation in the injected joint.  These effects all should resolve within a day after your procedure.   HOME CARE INSTRUCTIONS  · Limit yourself to light activity the day of your procedure. Avoid lifting heavy objects, bending, stooping, or twisting.  · Take prescription or over-the-counter pain medication as directed by your caregiver.  · You may apply ice to your injection site to reduce pain and swelling the day of your procedure. Ice may be applied 03-04 times:  ¨ Put ice in a plastic bag.  ¨ Place a towel between your skin and the bag.  ¨ Leave the ice on for no longer than 15-20 minutes each time.  SEEK  IMMEDIATE MEDICAL CARE IF:   · Pain and swelling get worse rather than better or extend beyond the injection site.  · Numbness does not go away.  · Blood or fluid continues to leak from the injection site.  · You have chest pain.  · You have swelling of your face or tongue.  · You have trouble breathing or you become dizzy.  · You develop a fever, chills, or severe tenderness at the injection site that last longer than 1 day.  MAKE SURE YOU:  · Understand these instructions.  · Watch your condition.  · Get help right away if you are not doing well or if you get worse.  Document Released: 01/07/2011 Document Revised: 07/19/2011 Document Reviewed: 01/07/2011  [employer]® Patient Information ©2015 [employer]. This information is not intended to replace advice given to you by your health care provider. Make sure you discuss any questions you have with your health care provider.
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
## 2014-11-14 LAB — TESTOSTERONE, FREE, TOTAL, SHBG
Sex Hormone Binding: 68 nmol/L (ref 22–77)
TESTOSTERONE-% FREE: 1.2 % — AB (ref 1.6–2.9)
TESTOSTERONE: 329 ng/dL (ref 300–890)
Testosterone, Free: 39.5 pg/mL — ABNORMAL LOW (ref 47.0–244.0)

## 2014-11-16 ENCOUNTER — Encounter: Payer: Self-pay | Admitting: Family Medicine

## 2014-12-18 ENCOUNTER — Ambulatory Visit: Payer: Medicare Other | Admitting: Internal Medicine

## 2015-02-26 DIAGNOSIS — E0789 Other specified disorders of thyroid: Secondary | ICD-10-CM | POA: Diagnosis not present

## 2015-02-26 DIAGNOSIS — E539 Vitamin B deficiency, unspecified: Secondary | ICD-10-CM | POA: Diagnosis not present

## 2015-02-26 DIAGNOSIS — E349 Endocrine disorder, unspecified: Secondary | ICD-10-CM | POA: Diagnosis not present

## 2015-02-26 DIAGNOSIS — Z125 Encounter for screening for malignant neoplasm of prostate: Secondary | ICD-10-CM | POA: Diagnosis not present

## 2015-02-26 DIAGNOSIS — D519 Vitamin B12 deficiency anemia, unspecified: Secondary | ICD-10-CM | POA: Diagnosis not present

## 2015-02-26 DIAGNOSIS — E559 Vitamin D deficiency, unspecified: Secondary | ICD-10-CM | POA: Diagnosis not present

## 2015-02-26 DIAGNOSIS — E038 Other specified hypothyroidism: Secondary | ICD-10-CM | POA: Diagnosis not present

## 2015-02-26 DIAGNOSIS — D529 Folate deficiency anemia, unspecified: Secondary | ICD-10-CM | POA: Diagnosis not present

## 2015-02-26 DIAGNOSIS — R5383 Other fatigue: Secondary | ICD-10-CM | POA: Diagnosis not present

## 2015-03-14 DIAGNOSIS — N401 Enlarged prostate with lower urinary tract symptoms: Secondary | ICD-10-CM | POA: Diagnosis not present

## 2015-03-14 DIAGNOSIS — N138 Other obstructive and reflux uropathy: Secondary | ICD-10-CM | POA: Diagnosis not present

## 2015-03-14 DIAGNOSIS — E291 Testicular hypofunction: Secondary | ICD-10-CM | POA: Diagnosis not present

## 2015-03-17 DIAGNOSIS — M353 Polymyalgia rheumatica: Secondary | ICD-10-CM | POA: Diagnosis not present

## 2015-03-17 DIAGNOSIS — Z23 Encounter for immunization: Secondary | ICD-10-CM | POA: Diagnosis not present

## 2015-03-17 DIAGNOSIS — M2550 Pain in unspecified joint: Secondary | ICD-10-CM | POA: Diagnosis not present

## 2015-03-17 DIAGNOSIS — R5383 Other fatigue: Secondary | ICD-10-CM | POA: Diagnosis not present

## 2015-03-17 DIAGNOSIS — Z7952 Long term (current) use of systemic steroids: Secondary | ICD-10-CM | POA: Diagnosis not present

## 2015-03-25 ENCOUNTER — Telehealth: Payer: Self-pay

## 2015-03-25 ENCOUNTER — Ambulatory Visit (INDEPENDENT_AMBULATORY_CARE_PROVIDER_SITE_OTHER): Payer: Medicare Other | Admitting: Internal Medicine

## 2015-03-25 VITALS — BP 160/127 | HR 75 | Temp 98.3°F | Resp 16 | Ht 72.0 in | Wt 196.6 lb

## 2015-03-25 DIAGNOSIS — Z7189 Other specified counseling: Secondary | ICD-10-CM | POA: Diagnosis not present

## 2015-03-25 DIAGNOSIS — G4701 Insomnia due to medical condition: Secondary | ICD-10-CM | POA: Diagnosis not present

## 2015-03-25 DIAGNOSIS — R451 Restlessness and agitation: Secondary | ICD-10-CM

## 2015-03-25 DIAGNOSIS — T50905A Adverse effect of unspecified drugs, medicaments and biological substances, initial encounter: Secondary | ICD-10-CM

## 2015-03-25 MED ORDER — ZOLPIDEM TARTRATE 10 MG PO TABS: 10.0000 mg | ORAL_TABLET | Freq: Every evening | ORAL | Status: DC | PRN

## 2015-03-25 NOTE — Progress Notes (Signed)
Patient ID: Peter Bruce, male   DOB: Sep 21, 1950, 64 y.o.   MRN: [id number]   03/25/2015 at 12:59 PM  Estil Daft / DOB: March 20, 1951 / MRN: [id number]  Problem list reviewed and updated by me where necessary.   SUBJECTIVE  Peter Bruce is a 64 y.o. ill appearing male presenting for the chief complaint of I cannot sleep.  He states he is taking 2-3 ambien 10mg  per nite as well as restoril and xanax. His cns meds are complex and each med was reviewed . He also is on a tapering dose of prednisone for PMR and attributes his insomnia to the prednisone even though he is down to 10mg  qd now. He is hyper, anxious, and seems desperate to sleep. He agrees to discuss his meds with Dr. Laney Pastor tomorrow.   He  has a past medical history of Hypertension; Arthritis; and Neuromuscular disorder ([place]).    Medications reviewed and updated by myself where necessary, and exist elsewhere in the encounter.   Peter Bruce is allergic to amitriptyline and other. He  reports that he has never smoked. He has never used smokeless tobacco. He reports that he does not drink alcohol or use illicit drugs. He  has no sexual activity history on file. The patient  has past surgical history that includes Appendectomy; Hernia repair; Shoulder surgery; and arm surgery .  His family history includes Cancer in his mother.  Review of Systems  Constitutional: Negative for fever.  Eyes: Negative for photophobia.  Respiratory: Negative for shortness of breath.   Cardiovascular: Negative for chest pain.  Gastrointestinal: Negative for nausea and abdominal pain.  Skin: Negative for rash.  Neurological: Negative for dizziness, sensory change, speech change, focal weakness and headaches.  Psychiatric/Behavioral: Negative for memory loss. The patient is nervous/anxious and has insomnia.     OBJECTIVE  His  height is 6' (1.829 m) and weight is 196 lb 9.6 oz (89.177 kg). His oral temperature is 98.3 F (36.8 C). His blood pressure is 160/127 and his  pulse is 75. His respiration is 16 and oxygen saturation is 98%.  The patient's body mass index is 26.66 kg/(m^2).  Physical Exam  Constitutional: He is oriented to person, place, and time. He appears well-developed and well-nourished. He appears distressed.  HENT:  Head: Normocephalic.  Nose: Nose normal.  Eyes: Conjunctivae and EOM are normal. Pupils are equal, round, and reactive to light.  Neck: Normal range of motion.  Cardiovascular: Normal rate.  A regularly irregular rhythm present.  Respiratory: Effort normal. No respiratory distress. He exhibits no tenderness.  Neurological: He is alert and oriented to person, place, and time. No cranial nerve deficit. He exhibits normal muscle tone. Coordination normal.  Skin: He is not diaphoretic.  Psychiatric: Judgment and thought content normal. His mood appears anxious. His affect is labile. His speech is rapid and/or pressured. He is agitated. He is not aggressive. Cognition and memory are normal.  Consider excess seritonin    No results found for this or any previous visit (from the past 24 hour(s)).  ASSESSMENT & PLAN  Peter Bruce was seen today for medication refill, anxiety and insomnia.  Diagnoses and all orders for this visit:  Insomnia due to medical condition -     zolpidem (AMBIEN) 10 MG tablet; Take 1 tablet (10 mg total) by mouth at bedtime as needed for sleep.  Drug reaction, initial encounter -     zolpidem (AMBIEN) 10 MG tablet; Take 1 tablet (10 mg total) by mouth at bedtime  as needed for sleep.  Consider excess seritonin

## 2015-03-25 NOTE — Telephone Encounter (Signed)
Dr. Laney Pastor, this patient of yours was seen today (03/25/15) by Dr. Elder Cyphers and was told to follow up with you tomorrow (11/16). He has some issues with a medication (reaction to prednisone) and RA issues. He received a prescription from Dr. Elder Cyphers but needs to see you for additional treatment. Can this patient be worked in? Your schedule is booked. Can someone please call patient to let him know? Cb# [phone].

## 2015-03-25 NOTE — Telephone Encounter (Signed)
You can have him see D Gessner at 10 or Rhetta Mura at 3 and I will see him with them Or I can see him fri sat or sun am at [employer]

## 2015-03-25 NOTE — Patient Instructions (Addendum)
Prednisone tablets What is this medicine? PREDNISONE (PRED ni sone) is a corticosteroid. It is commonly used to treat inflammation of the skin, joints, lungs, and other organs. Common conditions treated include asthma, allergies, and arthritis. It is also used for other conditions, such as blood disorders and diseases of the adrenal glands. This medicine may be used for other purposes; ask your health care provider or pharmacist if you have questions. What should I tell my health care provider before I take this medicine? They need to know if you have any of these conditions: -Cushing's syndrome -diabetes -glaucoma -heart disease -high blood pressure -infection (especially a virus infection such as chickenpox, cold sores, or herpes) -kidney disease -liver disease -mental illness -myasthenia gravis -osteoporosis -seizures -stomach or intestine problems -thyroid disease -an unusual or allergic reaction to lactose, prednisone, other medicines, foods, dyes, or preservatives -pregnant or trying to get pregnant -breast-feeding How should I use this medicine? Take this medicine by mouth with a Kieara Schwark of water. Follow the directions on the prescription label. Take this medicine with food. If you are taking this medicine once a day, take it in the morning. Do not take more medicine than you are told to take. Do not suddenly stop taking your medicine because you may develop a severe reaction. Your doctor will tell you how much medicine to take. If your doctor wants you to stop the medicine, the dose may be slowly lowered over time to avoid any side effects. Talk to your pediatrician regarding the use of this medicine in children. Special care may be needed. Overdosage: If you think you have taken too much of this medicine contact a poison control center or emergency room at once. NOTE: This medicine is only for you. Do not share this medicine with others. What if I miss a dose? If you miss a dose,  take it as soon as you can. If it is almost time for your next dose, talk to your doctor or health care professional. You may need to miss a dose or take an extra dose. Do not take double or extra doses without advice. What may interact with this medicine? Do not take this medicine with any of the following medications: -metyrapone -mifepristone This medicine may also interact with the following medications: -aminoglutethimide -amphotericin B -aspirin and aspirin-like medicines -barbiturates -certain medicines for diabetes, like glipizide or glyburide -cholestyramine -cholinesterase inhibitors -cyclosporine -digoxin -diuretics -ephedrine -male hormones, like estrogens and birth control pills -isoniazid -ketoconazole -NSAIDS, medicines for pain and inflammation, like ibuprofen or naproxen -phenytoin -rifampin -toxoids -vaccines -warfarin This list may not describe all possible interactions. Give your health care provider a list of all the medicines, herbs, non-prescription drugs, or dietary supplements you use. Also tell them if you smoke, drink alcohol, or use illegal drugs. Some items may interact with your medicine. What should I watch for while using this medicine? Visit your doctor or health care professional for regular checks on your progress. If you are taking this medicine over a prolonged period, carry an identification card with your name and address, the type and dose of your medicine, and your doctor's name and address. This medicine may increase your risk of getting an infection. Tell your doctor or health care professional if you are around anyone with measles or chickenpox, or if you develop sores or blisters that do not heal properly. If you are going to have surgery, tell your doctor or health care professional that you have taken this medicine within the last   twelve months. Ask your doctor or health care professional about your diet. You may need to lower the amount  of salt you eat. This medicine may affect blood sugar levels. If you have diabetes, check with your doctor or health care professional before you change your diet or the dose of your diabetic medicine. What side effects may I notice from receiving this medicine? Side effects that you should report to your doctor or health care professional as soon as possible: -allergic reactions like skin rash, itching or hives, swelling of the face, lips, or tongue -changes in emotions or moods -changes in vision -depressed mood -eye pain -fever or chills, cough, sore throat, pain or difficulty passing urine -increased thirst -swelling of ankles, feet Side effects that usually do not require medical attention (report to your doctor or health care professional if they continue or are bothersome): -confusion, excitement, restlessness -headache -nausea, vomiting -skin problems, acne, thin and shiny skin -trouble sleeping -weight gain This list may not describe all possible side effects. Call your doctor for medical advice about side effects. You may report side effects to FDA at 1-800-FDA-1088. Where should I keep my medicine? Keep out of the reach of children. Store at room temperature between 15 and 30 degrees C (59 and 86 degrees F). Protect from light. Keep container tightly closed. Throw away any unused medicine after the expiration date. NOTE: This sheet is a summary. It may not cover all possible information. If you have questions about this medicine, talk to your doctor, pharmacist, or health care provider.    2016, Elsevier/Gold Standard. (2010-12-10 10:57:14) Insomnia Insomnia is a sleep disorder that makes it difficult to fall asleep or to stay asleep. Insomnia can cause tiredness (fatigue), low energy, difficulty concentrating, mood swings, and poor performance at work or school.  There are three different ways to classify insomnia:  Difficulty falling asleep.  Difficulty staying  asleep.  Waking up too early in the morning. Any type of insomnia can be long-term (chronic) or short-term (acute). Both are common. Short-term insomnia usually lasts for three months or less. Chronic insomnia occurs at least three times a week for longer than three months. CAUSES  Insomnia may be caused by another condition, situation, or substance, such as:  Anxiety.  Certain medicines.  Gastroesophageal reflux disease (GERD) or other gastrointestinal conditions.  Asthma or other breathing conditions.  Restless legs syndrome, sleep apnea, or other sleep disorders.  Chronic pain.  Menopause. This may include hot flashes.  Stroke.  Abuse of alcohol, tobacco, or illegal drugs.  Depression.  Caffeine.   Neurological disorders, such as Alzheimer disease.  An overactive thyroid (hyperthyroidism). The cause of insomnia may not be known. RISK FACTORS Risk factors for insomnia include:  Gender. Women are more commonly affected than men.  Age. Insomnia is more common as you get older.  Stress. This may involve your professional or personal life.  Income. Insomnia is more common in people with lower income.  Lack of exercise.   Irregular work schedule or night shifts.  Traveling between different time zones. SIGNS AND SYMPTOMS If you have insomnia, trouble falling asleep or trouble staying asleep is the main symptom. This may lead to other symptoms, such as:  Feeling fatigued.  Feeling nervous about going to sleep.  Not feeling rested in the morning.  Having trouble concentrating.  Feeling irritable, anxious, or depressed. TREATMENT  Treatment for insomnia depends on the cause. If your insomnia is caused by an underlying condition, treatment will focus  on addressing the condition. Treatment may also include:   Medicines to help you sleep.  Counseling or therapy.  Lifestyle adjustments. HOME CARE INSTRUCTIONS   Take medicines only as directed by your  health care provider.  Keep regular sleeping and waking hours. Avoid naps.  Keep a sleep diary to help you and your health care provider figure out what could be causing your insomnia. Include:   When you sleep.  When you wake up during the night.  How well you sleep.   How rested you feel the next day.  Any side effects of medicines you are taking.  What you eat and drink.   Make your bedroom a comfortable place where it is easy to fall asleep:  Put up shades or special blackout curtains to block light from outside.  Use a white noise machine to block noise.  Keep the temperature cool.   Exercise regularly as directed by your health care provider. Avoid exercising right before bedtime.  Use relaxation techniques to manage stress. Ask your health care provider to suggest some techniques that may work well for you. These may include:  Breathing exercises.  Routines to release muscle tension.  Visualizing peaceful scenes.  Cut back on alcohol, caffeinated beverages, and cigarettes, especially close to bedtime. These can disrupt your sleep.  Do not overeat or eat spicy foods right before bedtime. This can lead to digestive discomfort that can make it hard for you to sleep.  Limit screen use before bedtime. This includes:  Watching TV.  Using your smartphone, tablet, and computer.  Stick to a routine. This can help you fall asleep faster. Try to do a quiet activity, brush your teeth, and go to bed at the same time each night.  Get out of bed if you are still awake after 15 minutes of trying to sleep. Keep the lights down, but try reading or doing a quiet activity. When you feel sleepy, go back to bed.  Make sure that you drive carefully. Avoid driving if you feel very sleepy.  Keep all follow-up appointments as directed by your health care provider. This is important. SEEK MEDICAL CARE IF:   You are tired throughout the day or have trouble in your daily routine  due to sleepiness.  You continue to have sleep problems or your sleep problems get worse. SEEK IMMEDIATE MEDICAL CARE IF:   You have serious thoughts about hurting yourself or someone else.   This information is not intended to replace advice given to you by your health care provider. Make sure you discuss any questions you have with your health care provider.   Document Released: 04/23/2000 Document Revised: 01/15/2015 Document Reviewed: 01/25/2014 Elsevier Interactive Patient Education [employer].

## 2015-03-26 ENCOUNTER — Ambulatory Visit (INDEPENDENT_AMBULATORY_CARE_PROVIDER_SITE_OTHER): Payer: Medicare Other | Admitting: Internal Medicine

## 2015-03-26 ENCOUNTER — Encounter: Payer: Self-pay | Admitting: Internal Medicine

## 2015-03-26 VITALS — BP 167/107 | HR 82 | Temp 98.1°F | Resp 16 | Ht 69.0 in | Wt 196.0 lb

## 2015-03-26 DIAGNOSIS — R5383 Other fatigue: Secondary | ICD-10-CM

## 2015-03-26 DIAGNOSIS — M353 Polymyalgia rheumatica: Secondary | ICD-10-CM | POA: Diagnosis not present

## 2015-03-26 DIAGNOSIS — G4700 Insomnia, unspecified: Secondary | ICD-10-CM | POA: Diagnosis not present

## 2015-03-26 DIAGNOSIS — I10 Essential (primary) hypertension: Secondary | ICD-10-CM

## 2015-03-26 NOTE — Telephone Encounter (Signed)
Scheduled him to see Dr Laney Pastor today at 3pm for his follow up

## 2015-03-27 ENCOUNTER — Encounter: Payer: Self-pay | Admitting: Internal Medicine

## 2015-03-27 DIAGNOSIS — R5383 Other fatigue: Secondary | ICD-10-CM

## 2015-03-27 NOTE — Progress Notes (Addendum)
Subjective:    Patient ID: Peter Bruce, male    DOB: 12-26-1950, 64 y.o.   MRN: [id number]  HPI  Chief Complaint  Patient presents with  . Medication Management  sent to me by Dr Octavio Graves OV yesterday See Urol OV recent--11/4  Awaiting OV Dr Trudie Reed last week with labs After tapering to every other day prednisone for his polymyalgia rheumatica, he apparently missed many doses and was almost off this medication when he saw Dr. Gaynelle Arabian. When he followed up with Dr. Trudie Reed, he resumed 10 mg daily which he is on now. Over the past week he has been unable to sleep and feels jittery all day long. He also has run out of his sleep medication. When he took Ambien again last night he slept well although he still feels jittery today.  He has had a sleep disorder for many years. When he first saw me several years ago he was on Ambien and Restoril for sleep and had been for many years. This medication was continued without an underlying etiology being considered as he had no daytime hypersomnolence and no snoring or observed apnea. He takes Restoril 2 hours before bedtime, Ambien at bedtime and Ambien again when he wakes up 4 hours later. He wakes up 4 hours after that and feels rested although over the past 2 months he has not gotten as much benefit from these medicines as in the past. He was started on Adderall in the morning by Dr. Gaynelle Arabian several years ago in an effort to have him wake up and get his day started. He is been off this medication since July and is unsure whether that plays a role in his symptoms. He complains that over the past month he has been unable to work cause is too tired to do his job. He has easy fatigability without shortness of breath. He has not had arthralgias and myalgias since he started treatment for PMR, even after weaning off the prednisone. He does not complain of hypersomnolence. He denies depression and denies lack of motivation but admits that when he feels so tired that  it makes him also feel down. He thinks his past history of anxiety is not a current problem either.  Because he has a long history of sleep issues, muscle issues, intermittent fatigue, and situational anxiety, he has a hard time dating the onset of his current symptoms in relationship to medication changes.    --Associated problem-hypertension currently on amlodipine 10 mg one half tablets a day and Microzide 12.5 mg daily These medicines also are the same for many years. He does home blood pressures and he says most of them are normal. BP Readings from Last 3 Encounters:  03/26/15 167/107  03/25/15 160/127  11/13/14 170/100   At Dr. Trudie Reed his blood pressure was 120/82 At Dr. Mcneil Sober = 152/95 He denies edema, chest pain, palpitations, dyspnea on exertion.  Other problems include: -Pulmonary embolism 2010 -Fibromyalgia 2010--probably an erroneous diagnosis consider an recent definition as polymyalgia rheumatica -History of anxiety -History of atrial fibrillation -BMI 31 -LDL 120s, HDL >50--reluctant to use statins  Current meds other than those noted above: -Alprazolam 0.25 mg when necessary daily for anxiety. 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 This is been present since he completed surgery on his right elbow and carpal tunnel surgery on the right wrist several years ago. -When necessary sildenafil  Review  of Systems  Constitutional: Positive for activity change and fatigue. Negative for fever, appetite change and unexpected weight change.  HENT: Negative for trouble swallowing.   Eyes: Negative for visual disturbance.  Respiratory: Negative for cough and shortness of breath.   Cardiovascular: Negative for chest pain, palpitations and leg swelling.  Gastrointestinal: Negative for abdominal pain.  Genitourinary: Negative for difficulty urinating.  Skin: Negative for rash.    Neurological: Positive for weakness. Negative for dizziness, tremors, speech difficulty and headaches.  Psychiatric/Behavioral: Positive for sleep disturbance, dysphoric mood and agitation. The patient is not nervous/anxious.        Objective:   Physical Exam BP 167/107 mmHg  Pulse 82  Temp(Src) 98.1 F (36.7 C)  Resp 16  Ht 5\' 9"  (1.753 m)  Wt 196 lb (88.905 kg)  BMI 28.93 kg/m2 PERRLA and EOMs conjugate No thyromegaly or lymphadenopathy Heart regular without murmur No carotid bruits Lungs clear Extremities without edema Good peripheral pulses No swelling of joints Cranial nerves II through XII intact Gait normal Oriented to time person place Affect apprehensive Thought content dominated by this apprehension       Assessment & Plan:  Insomnia--I believe this is a primary problem driving his fatigue and the fact that it does not respond to 20 mg of Ambien +30 mg of Restoril at night suggests that we don't understand the diagnosis/cause of his sleep disorder. I want to refer him to a sleep center for further evaluation. He does not want to do this yet and has resisted this in the past. He thinks the problem is simple and that if we just give him more Restoril so that he can take 60 mg of Restoril and 20 mg of Ambien he will be fine. I disagree and will give him Belsomra to try 3 nights at 15 mg and then 3 nights at 20 mg and call me the results. I will still maintain that he needs further evaluation for a better diagnosis and treatment plan. I also would try trazodone or Seroquel if Belsomra fails, rather than putting him back on such a heavy load of benzodiazepines until we have a more clear diagnosis.  Fatigue--I will review the blood work from last week from [facility]. Dr. Gaynelle Arabian established normal testosterone levels.  Polymyalgia rheumatica- -Fortunately stabilized by recent treatment. Because he has used prednisone on his own for many years it seems unlikely to me  that he should be having side effects of prednisone at this point  Essential hypertension --His blood pressure certainly adequate control and I advocate starting ACE or ARB. He is suspicious that he's had these medicines before with bad side effects, and he says that his pressure is usually normal on his current medications. He will record his home blood pressures daily for the next 2 weeks and send me the results. Perhaps his blood pressure elevation is secondary to prednisone restarting.  Initial f/u by phone  48min OV

## 2015-03-28 ENCOUNTER — Encounter: Payer: Self-pay | Admitting: Internal Medicine

## 2015-03-28 ENCOUNTER — Other Ambulatory Visit: Payer: Self-pay

## 2015-03-28 ENCOUNTER — Telehealth: Payer: Self-pay

## 2015-03-28 DIAGNOSIS — Z8679 Personal history of other diseases of the circulatory system: Secondary | ICD-10-CM

## 2015-03-28 DIAGNOSIS — R5383 Other fatigue: Secondary | ICD-10-CM

## 2015-03-28 NOTE — Telephone Encounter (Signed)
Pt is still having trouble sleeping the new medication is not helping and believes that we need to go back to the [facility] number [phone]

## 2015-03-31 NOTE — Addendum Note (Signed)
Addended by: [name] on: 03/31/2015 09:55 PM   Modules accepted: Orders

## 2015-04-07 ENCOUNTER — Encounter: Payer: Self-pay | Admitting: Internal Medicine

## 2015-04-08 NOTE — Progress Notes (Signed)
     HPI: 64 year old male for evaluation of chest pain.  Current Outpatient Prescriptions  Medication Sig Dispense Refill  . ALPRAZolam (XANAX) 0.25 MG tablet Take 1 tablet (0.25 mg total) by mouth 1 day or 1 dose. For anxiety or dysphagia 30 tablet 5  . amLODipine (NORVASC) 10 MG tablet One and one-half daily 135 tablet 1  . b complex vitamins tablet Take 1 tablet by mouth daily.    . clonazePAM (KLONOPIN) 0.5 MG tablet Take 1 tablet (0.5 mg total) by mouth 2 (two) times daily as needed (for neuropathic pain). 60 tablet 5  . hydrochlorothiazide (MICROZIDE) 12.5 MG capsule Take 1 capsule (12.5 mg total) by mouth daily. 90 capsule 1  . sildenafil (REVATIO) 20 MG tablet Take 5 tablets by mouth daily as needed for ED. 50 tablet 11  . temazepam (RESTORIL) 30 MG capsule 1 at 7pm to prepare for bed 30 capsule 5  . zolpidem (AMBIEN) 10 MG tablet 1 at bedtime and 1 when wakes at 3am 60 tablet 5   No current facility-administered medications for this visit.    Allergies  Allergen Reactions  . Amitriptyline Shortness Of Breath  . Other     SSRI uptake    Past Medical History  Diagnosis Date  . Hypertension   . Arthritis   . Neuromuscular disorder [facility])     Past Surgical History  Procedure Laterality Date  . Appendectomy    . Hernia repair    . Shoulder surgery    . Arm surgery       Social History   Social History  . Marital Status: Married    Spouse Name: N/A  . Number of Children: N/A  . Years of Education: N/A   Occupational History  . licensed Chief Financial Officer     self-employed   Social History Main Topics  . Smoking status: Never Smoker   . Smokeless tobacco: Never Used  . Alcohol Use: No  . Drug Use: No  . Sexual Activity: Not on file   Other Topics Concern  . Not on file   Social History Narrative   Lives with his wife (married 1990).  Former Health visitor. Body building/weight lifting, running for exercise.    Family History  Problem  Relation Age of Onset  . Cancer Mother     ROS: no fevers or chills, productive cough, hemoptysis, dysphasia, odynophagia, melena, hematochezia, dysuria, hematuria, rash, seizure activity, orthopnea, PND, pedal edema, claudication. Remaining systems are negative.  Physical Exam:   There were no vitals taken for this visit.  General:  Well developed/well nourished in NAD Skin warm/dry Patient not depressed No peripheral clubbing Back-normal HEENT-normal/normal eyelids Neck supple/normal carotid upstroke bilaterally; no bruits; no JVD; no thyromegaly chest - CTA/ normal expansion CV - RRR/normal S1 and S2; no murmurs, rubs or gallops;  PMI nondisplaced Abdomen -NT/ND, no HSM, no mass, + bowel sounds, no bruit 2+ femoral pulses, no bruits Ext-no edema, chords, 2+ DP Neuro-grossly nonfocal  ECG    This encounter was created in error - please disregard.

## 2015-04-14 ENCOUNTER — Encounter: Payer: Self-pay | Admitting: Cardiology

## 2015-04-14 ENCOUNTER — Ambulatory Visit (INDEPENDENT_AMBULATORY_CARE_PROVIDER_SITE_OTHER): Payer: Medicare Other | Admitting: Cardiology

## 2015-04-14 VITALS — BP 132/86 | HR 72 | Ht 70.0 in | Wt 198.3 lb

## 2015-04-14 DIAGNOSIS — I481 Persistent atrial fibrillation: Secondary | ICD-10-CM

## 2015-04-14 DIAGNOSIS — I10 Essential (primary) hypertension: Secondary | ICD-10-CM | POA: Diagnosis not present

## 2015-04-14 DIAGNOSIS — R0602 Shortness of breath: Secondary | ICD-10-CM

## 2015-04-14 DIAGNOSIS — E785 Hyperlipidemia, unspecified: Secondary | ICD-10-CM

## 2015-04-14 DIAGNOSIS — R5382 Chronic fatigue, unspecified: Secondary | ICD-10-CM | POA: Diagnosis not present

## 2015-04-14 DIAGNOSIS — I4819 Other persistent atrial fibrillation: Secondary | ICD-10-CM

## 2015-04-14 DIAGNOSIS — R5383 Other fatigue: Secondary | ICD-10-CM | POA: Insufficient documentation

## 2015-04-14 MED ORDER — RIVAROXABAN 20 MG PO TABS: 20.0000 mg | ORAL_TABLET | Freq: Every day | ORAL | Status: DC

## 2015-04-14 NOTE — Patient Instructions (Signed)
Start Xarelto 20 mg daily for blood thinner  We will schedule you for a nuclear stress test and an Echocardiogram  Stop ASA and NSAIDs.

## 2015-04-14 NOTE — Progress Notes (Signed)
Cardiology Office Note   Date:  04/14/2015   ID:  Peter Bruce, DOB Jul 31, 1950, MRN [id number]  PCP:  Peter Koyanagi, MD  Cardiologist:   Peter Nester Martinique, MD   Chief Complaint  Patient presents with  . New Patient (Initial Visit)    no chest pain, no shortness of breath, no edema, no pain or cramping in legs, no lightheadedness or dizziness      History of Present Illness: Peter Bruce is a 64 y.o. male who presents for evaluation of fatigue at the request of Dr. Laney Bruce. He has no known history of heart disease. He does report a skipped heart beat noted in the 1960s that kept him out of the [employer]. More recently he has complained of worsening fatigue and weakness with exertion. He denies any chest pain or SOB. Initially he attributed this to his PMR and the fact that he hadn't been taking his steroids as regularly. He got back on 10 mg prednisone without improvement. He mentally felt more foggy. Seen by urology and testosterone levels done. No dizziness or syncope. Saw his Rheumatologist as well. Now can't do even simple tasks like taking out the trash without getting very tired. This is unusual for him. 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 He does complain of recent insomnia. 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 He was treated with Xarelto for 6 months by Dr. Joya Bruce. No known history of recurrence.     Past Medical History  Diagnosis Date  . Hypertension   . Arthritis   . Neuromuscular disorder ([place])   . PMR (polymyalgia rheumatica) ([facility])   . Acute pulmonary embolus ([place]) 2010  . Hypercholesterolemia   . Atrial fibrillation [facility])     Past Surgical History  Procedure Laterality Date  . Appendectomy    . Hernia repair    . Shoulder surgery    . Arm surgery        Current Outpatient Prescriptions  Medication Sig Dispense Refill  . ALPRAZolam (XANAX) 0.25 MG tablet Take 1 tablet (0.25 mg total) by mouth 1  day or 1 dose. For anxiety or dysphagia 30 tablet 5  . amLODipine (NORVASC) 10 MG tablet One and one-half daily 135 tablet 1  . b complex vitamins tablet Take 1 tablet by mouth daily.    . clonazePAM (KLONOPIN) 0.5 MG tablet Take 1 tablet (0.5 mg total) by mouth 2 (two) times daily as needed (for neuropathic pain). 60 tablet 5  . hydrochlorothiazide (MICROZIDE) 12.5 MG capsule Take 1 capsule (12.5 mg total) by mouth daily. 90 capsule 1  . rivaroxaban (XARELTO) 20 MG TABS tablet Take 1 tablet (20 mg total) by mouth daily with supper. 30 tablet 11  . sildenafil (REVATIO) 20 MG tablet Take 5 tablets by mouth daily as needed for ED. 50 tablet 11  . temazepam (RESTORIL) 30 MG capsule 1 at 7pm to prepare for bed 30 capsule 5  . zolpidem (AMBIEN) 10 MG tablet 1 at bedtime and 1 when wakes at 3am 60 tablet 5   No current facility-administered medications for this visit.    Allergies:   Amitriptyline and Other    Social History:  The patient  reports that he has never smoked. He has never used smokeless tobacco. He reports that he does not drink alcohol or use illicit drugs.   Family History:  The patient's family history includes Cancer in his mother; Other in  his sister.    ROS:  Please see the history of present illness.   Otherwise, review of systems are positive for none.   All other systems are reviewed and negative.    PHYSICAL EXAM: VS:  BP 132/86 mmHg  Pulse 72  Ht 5\' 10"  (1.778 m)  Wt 89.954 kg (198 lb 5 oz)  BMI 28.45 kg/m2 , BMI Body mass index is 28.45 kg/(m^2). GEN: Well nourished, well developed, in no acute distress. He is quite anxious today and much of the history is given by his wife. HEENT: normal Neck: no JVD, carotid bruits, or masses Cardiac: IRRR; no murmurs, rubs, or gallops,no edema  Respiratory:  clear to auscultation bilaterally, normal work of breathing GI: soft, nontender, nondistended, + BS MS: no deformity or atrophy Skin: warm and dry, no rash Neuro:   Strength and sensation are intact Psych: euthymic mood, full affect   EKG:  EKG is ordered today. The ekg ordered today demonstrates Atrial fibrillation with rate 79 bpm. Frequent PVCs with couplets. Diffuse nonspecific ST-T changes.    Recent Labs: 11/13/2014: ALT 40; BUN 32*; Creat 1.15; Hemoglobin 17.0; Platelets 223; Potassium 3.9; Sodium 143; TSH 1.505    Lipid Panel    Component Value Date/Time   CHOL 203* 11/13/2014 1643   TRIG 117 11/13/2014 1643   HDL 54 11/13/2014 1643   CHOLHDL 3.8 11/13/2014 1643   VLDL 23 11/13/2014 1643   LDLCALC 126* 11/13/2014 1643     Lab Results  Component Value Date   WBC 10.9* 11/13/2014   HGB 17.0 11/13/2014   HCT 48.7 11/13/2014   PLT 223 11/13/2014   GLUCOSE 99 11/13/2014   CHOL 203* 11/13/2014   TRIG 117 11/13/2014   HDL 54 11/13/2014   LDLCALC 126* 11/13/2014   ALT 40 11/13/2014   AST 29 11/13/2014   NA 143 11/13/2014   K 3.9 11/13/2014   CL 104 11/13/2014   CREATININE 1.15 11/13/2014   BUN 32* 11/13/2014   CO2 25 11/13/2014   TSH 1.505 11/13/2014   PSA 3.09 12/22/2012   INR 3.2* 07/31/2008   HGBA1C 5.4 02/23/2012     Wt Readings from Last 3 Encounters:  04/14/15 89.954 kg (198 lb 5 oz)  03/26/15 88.905 kg (196 lb)  03/25/15 89.177 kg (196 lb 9.6 oz)      Other studies Reviewed: Additional studies/ records that were reviewed today include: none. Review of the above records demonstrates: NA   ASSESSMENT AND PLAN:  1.  Atrial fibrillation. Rate controlled on no rate slowing medications suggesting some underlying conduction system disease. This may be responsible for his symptoms of fatigue. CHAD-VASC score of 2. Recommend anticoagulation with Xarelto 20 mg daily. Stop ASA and NSAIDs. Will check Echo. Depending on Echo results may consider DC cardioversion after he has been anticoagulated for 4 weeks.   2. Exertional fatigue. May be related to #1. Need to rule out ischemic heart disease with possible anginal  equivalent. Will arrange for a stress Myoview. Assess LV function with Echo and Myoview.  3. PVCs ? Chronic  4. PMR on steroids.  5. History of pulmonary embolus 2010.   6. Hypercholesterolemia. Mild. If myoview is abnormal will recommend statin therapy  7. HTN controlled.    Current medicines are reviewed at length with the patient today.  The patient does not have concerns regarding medicines.  The following changes have been made:  See above  Labs/ tests ordered today include:  Orders Placed This Encounter  Procedures  . Myocardial Perfusion Imaging  . EKG 12-Lead  . ECHOCARDIOGRAM COMPLETE     Disposition:   FU with Dr. Martinique in 4 weeks  Signed, Betty Brooks Martinique, MD  04/14/2015 8:12 PM    [facility] [address] [place], [place], [address] Phone [phone], Fax [phone]

## 2015-04-16 DIAGNOSIS — M353 Polymyalgia rheumatica: Secondary | ICD-10-CM | POA: Diagnosis not present

## 2015-04-16 DIAGNOSIS — Z7952 Long term (current) use of systemic steroids: Secondary | ICD-10-CM | POA: Diagnosis not present

## 2015-04-16 DIAGNOSIS — R5383 Other fatigue: Secondary | ICD-10-CM | POA: Diagnosis not present

## 2015-04-16 DIAGNOSIS — M2550 Pain in unspecified joint: Secondary | ICD-10-CM | POA: Diagnosis not present

## 2015-04-17 ENCOUNTER — Telehealth (HOSPITAL_COMMUNITY): Payer: Self-pay

## 2015-04-17 ENCOUNTER — Encounter: Payer: Self-pay | Admitting: Cardiology

## 2015-04-17 NOTE — Telephone Encounter (Signed)
Encounter complete. 

## 2015-04-22 ENCOUNTER — Ambulatory Visit (HOSPITAL_COMMUNITY)
Admission: RE | Admit: 2015-04-22 | Discharge: 2015-04-22 | Disposition: A | Discharge status: Home / Self Care | Payer: Medicare Other | Source: Ambulatory Visit | Attending: Cardiovascular Disease | Admitting: Cardiovascular Disease

## 2015-04-22 DIAGNOSIS — E663 Overweight: Secondary | ICD-10-CM | POA: Diagnosis not present

## 2015-04-22 DIAGNOSIS — R5382 Chronic fatigue, unspecified: Secondary | ICD-10-CM | POA: Diagnosis not present

## 2015-04-22 DIAGNOSIS — I481 Persistent atrial fibrillation: Secondary | ICD-10-CM

## 2015-04-22 DIAGNOSIS — I10 Essential (primary) hypertension: Secondary | ICD-10-CM | POA: Diagnosis not present

## 2015-04-22 DIAGNOSIS — R0602 Shortness of breath: Secondary | ICD-10-CM | POA: Diagnosis not present

## 2015-04-22 DIAGNOSIS — R5383 Other fatigue: Secondary | ICD-10-CM | POA: Insufficient documentation

## 2015-04-22 DIAGNOSIS — I4819 Other persistent atrial fibrillation: Secondary | ICD-10-CM

## 2015-04-22 DIAGNOSIS — E785 Hyperlipidemia, unspecified: Secondary | ICD-10-CM | POA: Diagnosis not present

## 2015-04-22 DIAGNOSIS — R9439 Abnormal result of other cardiovascular function study: Secondary | ICD-10-CM | POA: Diagnosis not present

## 2015-04-22 DIAGNOSIS — Z6828 Body mass index (BMI) 28.0-28.9, adult: Secondary | ICD-10-CM | POA: Diagnosis not present

## 2015-04-22 LAB — MYOCARDIAL PERFUSION IMAGING
CHL CUP NUCLEAR SSS: 10
NUC STRESS TID: 1.2
Peak HR: 103 {beats}/min
Rest HR: 71 {beats}/min
SDS: 6
SRS: 4

## 2015-04-22 MED ORDER — AMINOPHYLLINE 25 MG/ML IV SOLN
75.0000 mg | Freq: Once | INTRAVENOUS | Status: AC
  Administered 2015-04-22: 75 mg via INTRAVENOUS

## 2015-04-22 MED ORDER — REGADENOSON 0.4 MG/5ML IV SOLN
0.4000 mg | Freq: Once | INTRAVENOUS | Status: AC
  Administered 2015-04-22: 0.4 mg via INTRAVENOUS

## 2015-04-22 MED ORDER — TECHNETIUM TC 99M SESTAMIBI GENERIC - CARDIOLITE
31.6000 | Freq: Once | INTRAVENOUS | Status: AC | PRN
  Administered 2015-04-22: 31.6 via INTRAVENOUS

## 2015-04-22 MED ORDER — TECHNETIUM TC 99M SESTAMIBI GENERIC - CARDIOLITE
10.5000 | Freq: Once | INTRAVENOUS | Status: AC | PRN
  Administered 2015-04-22: 10.5 via INTRAVENOUS

## 2015-04-24 ENCOUNTER — Other Ambulatory Visit: Payer: Self-pay

## 2015-04-24 ENCOUNTER — Encounter: Payer: Self-pay | Admitting: Internal Medicine

## 2015-04-24 ENCOUNTER — Other Ambulatory Visit: Payer: Self-pay | Admitting: Cardiology

## 2015-04-24 DIAGNOSIS — I4819 Other persistent atrial fibrillation: Secondary | ICD-10-CM

## 2015-04-24 DIAGNOSIS — R5382 Chronic fatigue, unspecified: Secondary | ICD-10-CM

## 2015-04-24 DIAGNOSIS — R9439 Abnormal result of other cardiovascular function study: Secondary | ICD-10-CM

## 2015-04-24 DIAGNOSIS — I10 Essential (primary) hypertension: Secondary | ICD-10-CM

## 2015-04-24 DIAGNOSIS — R0602 Shortness of breath: Secondary | ICD-10-CM

## 2015-04-29 ENCOUNTER — Other Ambulatory Visit: Payer: Self-pay

## 2015-04-29 ENCOUNTER — Ambulatory Visit (HOSPITAL_COMMUNITY): Payer: Medicare Other | Attending: Cardiology

## 2015-04-29 DIAGNOSIS — R5383 Other fatigue: Secondary | ICD-10-CM | POA: Diagnosis not present

## 2015-04-29 DIAGNOSIS — I340 Nonrheumatic mitral (valve) insufficiency: Secondary | ICD-10-CM | POA: Insufficient documentation

## 2015-04-29 DIAGNOSIS — I10 Essential (primary) hypertension: Secondary | ICD-10-CM

## 2015-04-29 DIAGNOSIS — I4819 Other persistent atrial fibrillation: Secondary | ICD-10-CM

## 2015-04-29 DIAGNOSIS — I481 Persistent atrial fibrillation: Secondary | ICD-10-CM

## 2015-04-29 DIAGNOSIS — R5382 Chronic fatigue, unspecified: Secondary | ICD-10-CM

## 2015-04-29 DIAGNOSIS — I517 Cardiomegaly: Secondary | ICD-10-CM | POA: Insufficient documentation

## 2015-04-29 DIAGNOSIS — E785 Hyperlipidemia, unspecified: Secondary | ICD-10-CM

## 2015-04-29 DIAGNOSIS — R0602 Shortness of breath: Secondary | ICD-10-CM | POA: Diagnosis not present

## 2015-04-29 DIAGNOSIS — E7800 Pure hypercholesterolemia, unspecified: Secondary | ICD-10-CM | POA: Insufficient documentation

## 2015-05-04 ENCOUNTER — Encounter: Payer: Self-pay | Admitting: Cardiology

## 2015-05-06 ENCOUNTER — Telehealth: Payer: Self-pay | Admitting: Cardiology

## 2015-05-06 NOTE — Telephone Encounter (Signed)
Patient called this morning wondering if he could have his cath sooner.  It is scheduled for 1/5 He said that he is nervous and is concerned with being tired out. Feels he is more tired out with activity since he has seen Dr. Martinique No chest pain, shortness of breath. Routing to Dr. Martinique and Malachy Mood to see if there is a possibility that they can move his cath up  Advised patient if he becomes short of breath, experiences chest pain or becomes increasingly exhausted with activity he needs to go to the ED

## 2015-05-06 NOTE — Telephone Encounter (Signed)
Pt called in wanting to know if there is anyway to expedite his catheterization because he is feeling week and is getting very anxious. Please consult with Dr. Martinique and call pt back   Thanks   * Pt also sent a patient message on 12/25*

## 2015-05-06 NOTE — Telephone Encounter (Signed)
I cannot do his heart cath any earlier as I am out this week. If it can be scheduled with one of my partners this is fine. He would need to hold Xarelto for 48 hours prior. Labs need to be updated.  Peter Martinique MD, [facility]

## 2015-05-08 NOTE — Telephone Encounter (Signed)
Spoke with pt letting him know what Dr Martinique had said, I also spoke with Deedie the scheduler and the earliest it can be done is Tuesday January 3rd, let pt know about that date and he stated he will speak to his wife about it seeing that she will be the one to bring him, pt also stated he might just keep it on the 5th of January but will call back and let me know of any change

## 2015-05-11 ENCOUNTER — Encounter: Payer: Self-pay | Admitting: Cardiology

## 2015-05-12 ENCOUNTER — Encounter: Payer: Self-pay | Admitting: Internal Medicine

## 2015-05-12 DIAGNOSIS — G4700 Insomnia, unspecified: Secondary | ICD-10-CM

## 2015-05-13 ENCOUNTER — Ambulatory Visit
Admission: RE | Admit: 2015-05-13 | Discharge: 2015-05-13 | Disposition: A | Discharge status: Home / Self Care | Payer: Medicare Other | Source: Ambulatory Visit | Attending: Cardiology | Admitting: Cardiology

## 2015-05-13 DIAGNOSIS — I481 Persistent atrial fibrillation: Secondary | ICD-10-CM | POA: Diagnosis not present

## 2015-05-13 DIAGNOSIS — R5382 Chronic fatigue, unspecified: Secondary | ICD-10-CM

## 2015-05-13 DIAGNOSIS — I4891 Unspecified atrial fibrillation: Secondary | ICD-10-CM | POA: Diagnosis not present

## 2015-05-13 DIAGNOSIS — R9439 Abnormal result of other cardiovascular function study: Secondary | ICD-10-CM | POA: Diagnosis not present

## 2015-05-13 DIAGNOSIS — R0602 Shortness of breath: Secondary | ICD-10-CM

## 2015-05-13 DIAGNOSIS — I10 Essential (primary) hypertension: Secondary | ICD-10-CM

## 2015-05-13 DIAGNOSIS — R0600 Dyspnea, unspecified: Secondary | ICD-10-CM | POA: Diagnosis not present

## 2015-05-13 DIAGNOSIS — I4819 Other persistent atrial fibrillation: Secondary | ICD-10-CM

## 2015-05-13 DIAGNOSIS — R931 Abnormal findings on diagnostic imaging of heart and coronary circulation: Secondary | ICD-10-CM | POA: Diagnosis not present

## 2015-05-13 LAB — CBC WITH DIFFERENTIAL/PLATELET
BASOS ABS: 0 10*3/uL (ref 0.0–0.1)
Basophils Relative: 0 % (ref 0–1)
Eosinophils Absolute: 0.2 10*3/uL (ref 0.0–0.7)
Eosinophils Relative: 2 % (ref 0–5)
HEMATOCRIT: 47.4 % (ref 39.0–52.0)
HEMOGLOBIN: 16.7 g/dL (ref 13.0–17.0)
LYMPHS ABS: 1.6 10*3/uL (ref 0.7–4.0)
LYMPHS PCT: 17 % (ref 12–46)
MCH: 32.7 pg (ref 26.0–34.0)
MCHC: 35.2 g/dL (ref 30.0–36.0)
MCV: 92.9 fL (ref 78.0–100.0)
MONO ABS: 0.8 10*3/uL (ref 0.1–1.0)
MPV: 11.4 fL (ref 8.6–12.4)
Monocytes Relative: 8 % (ref 3–12)
NEUTROS ABS: 6.9 10*3/uL (ref 1.7–7.7)
Neutrophils Relative %: 73 % (ref 43–77)
Platelets: 200 10*3/uL (ref 150–400)
RBC: 5.1 MIL/uL (ref 4.22–5.81)
RDW: 13.7 % (ref 11.5–15.5)
WBC: 9.5 10*3/uL (ref 4.0–10.5)

## 2015-05-13 LAB — PROTIME-INR
INR: 1.06 (ref <1.50)
Prothrombin Time: 13.9 seconds (ref 11.6–15.2)

## 2015-05-14 ENCOUNTER — Ambulatory Visit: Payer: Self-pay | Admitting: Internal Medicine

## 2015-05-14 ENCOUNTER — Telehealth: Payer: Self-pay | Admitting: Cardiology

## 2015-05-14 ENCOUNTER — Other Ambulatory Visit: Payer: Self-pay | Admitting: Internal Medicine

## 2015-05-14 LAB — BASIC METABOLIC PANEL
BUN: 24 mg/dL (ref 7–25)
CO2: 30 mmol/L (ref 20–31)
Calcium: 9.3 mg/dL (ref 8.6–10.3)
Chloride: 103 mmol/L (ref 98–110)
Creat: 1.29 mg/dL — ABNORMAL HIGH (ref 0.70–1.25)
GLUCOSE: 83 mg/dL (ref 65–99)
POTASSIUM: 4.3 mmol/L (ref 3.5–5.3)
SODIUM: 142 mmol/L (ref 135–146)

## 2015-05-14 MED ORDER — ZOLPIDEM TARTRATE 10 MG PO TABS: ORAL_TABLET | ORAL | Status: DC

## 2015-05-14 MED ORDER — DIAZEPAM 5 MG PO TABS: 5.0000 mg | ORAL_TABLET | Freq: Every evening | ORAL | Status: DC | PRN

## 2015-05-14 NOTE — Telephone Encounter (Signed)
Meds ordered this encounter  Medications  . zolpidem (AMBIEN) 10 MG tablet    Sig: 1 at bedtime and 1 when wakes at 3am    Dispense:  60 tablet    Refill:  5  . diazepam (VALIUM) 5 MG tablet    Sig: Take 1 tablet (5 mg total) by mouth at bedtime as needed for anxiety.    Dispense:  30 tablet    Refill:  2

## 2015-05-14 NOTE — Telephone Encounter (Signed)
Returned call to patient no answer.LMTC. 

## 2015-05-14 NOTE — Telephone Encounter (Signed)
Pt is returning your call. Thanks

## 2015-05-14 NOTE — Telephone Encounter (Signed)
Pt called in wanting to speak with Dr. Martinique about his Cath procedure he will be having tomorrow . Please f/u with him  Thanks

## 2015-05-14 NOTE — Telephone Encounter (Signed)
Received call from patient.He stated he wanted to make sure hospital has [place] parking at 5:30 am tomorrow.Advised  does have valet parking at 5:30 am.Advised ok for him to take a xanax in the morning before cardiac cath.

## 2015-05-14 NOTE — Telephone Encounter (Signed)
Faxed [facility] and notified pt on Mychart.

## 2015-05-15 ENCOUNTER — Ambulatory Visit (HOSPITAL_COMMUNITY)
Admission: RE | Admit: 2015-05-15 | Discharge: 2015-05-15 | Disposition: A | Discharge status: Home / Self Care | Payer: Medicare Other | Source: Ambulatory Visit | Attending: Cardiology | Admitting: Cardiology

## 2015-05-15 ENCOUNTER — Other Ambulatory Visit: Payer: Self-pay | Admitting: Cardiology

## 2015-05-15 ENCOUNTER — Encounter (HOSPITAL_COMMUNITY): Payer: Self-pay | Admitting: Cardiology

## 2015-05-15 ENCOUNTER — Encounter (HOSPITAL_COMMUNITY)
Admission: RE | Disposition: A | Discharge status: Home / Self Care | Payer: Self-pay | Source: Ambulatory Visit | Attending: Cardiology

## 2015-05-15 ENCOUNTER — Other Ambulatory Visit: Payer: Self-pay

## 2015-05-15 DIAGNOSIS — Z7952 Long term (current) use of systemic steroids: Secondary | ICD-10-CM | POA: Insufficient documentation

## 2015-05-15 DIAGNOSIS — M353 Polymyalgia rheumatica: Secondary | ICD-10-CM | POA: Diagnosis not present

## 2015-05-15 DIAGNOSIS — M1990 Unspecified osteoarthritis, unspecified site: Secondary | ICD-10-CM | POA: Diagnosis not present

## 2015-05-15 DIAGNOSIS — R5383 Other fatigue: Secondary | ICD-10-CM | POA: Diagnosis not present

## 2015-05-15 DIAGNOSIS — I4891 Unspecified atrial fibrillation: Secondary | ICD-10-CM | POA: Diagnosis not present

## 2015-05-15 DIAGNOSIS — I2582 Chronic total occlusion of coronary artery: Secondary | ICD-10-CM | POA: Diagnosis not present

## 2015-05-15 DIAGNOSIS — I10 Essential (primary) hypertension: Secondary | ICD-10-CM | POA: Diagnosis not present

## 2015-05-15 DIAGNOSIS — G709 Myoneural disorder, unspecified: Secondary | ICD-10-CM | POA: Insufficient documentation

## 2015-05-15 DIAGNOSIS — I2510 Atherosclerotic heart disease of native coronary artery without angina pectoris: Secondary | ICD-10-CM

## 2015-05-15 DIAGNOSIS — E7800 Pure hypercholesterolemia, unspecified: Secondary | ICD-10-CM | POA: Insufficient documentation

## 2015-05-15 DIAGNOSIS — Z86711 Personal history of pulmonary embolism: Secondary | ICD-10-CM | POA: Diagnosis not present

## 2015-05-15 DIAGNOSIS — R9439 Abnormal result of other cardiovascular function study: Secondary | ICD-10-CM | POA: Diagnosis present

## 2015-05-15 DIAGNOSIS — I119 Hypertensive heart disease without heart failure: Secondary | ICD-10-CM | POA: Diagnosis present

## 2015-05-15 DIAGNOSIS — E785 Hyperlipidemia, unspecified: Secondary | ICD-10-CM | POA: Diagnosis present

## 2015-05-15 DIAGNOSIS — I480 Paroxysmal atrial fibrillation: Secondary | ICD-10-CM | POA: Diagnosis present

## 2015-05-15 DIAGNOSIS — Z7901 Long term (current) use of anticoagulants: Secondary | ICD-10-CM | POA: Diagnosis not present

## 2015-05-15 HISTORY — PX: CARDIAC CATHETERIZATION: SHX172

## 2015-05-15 HISTORY — DX: Atherosclerotic heart disease of native coronary artery without angina pectoris: I25.10

## 2015-05-15 SURGERY — LEFT HEART CATH AND CORONARY ANGIOGRAPHY
Anesthesia: LOCAL

## 2015-05-15 MED ORDER — IOHEXOL 350 MG/ML SOLN
INTRAVENOUS | Status: DC | PRN
  Administered 2015-05-15: 80 mL via INTRA_ARTERIAL

## 2015-05-15 MED ORDER — LIDOCAINE HCL (PF) 1 % IJ SOLN
INTRAMUSCULAR | Status: AC
  Filled 2015-05-15: qty 30

## 2015-05-15 MED ORDER — ASPIRIN 81 MG PO CHEW
CHEWABLE_TABLET | ORAL | Status: AC
Start: 2015-05-15 — End: 2015-05-15
  Administered 2015-05-15: 81 mg via ORAL
  Filled 2015-05-15: qty 1

## 2015-05-15 MED ORDER — FENTANYL CITRATE (PF) 100 MCG/2ML IJ SOLN
INTRAMUSCULAR | Status: AC
  Filled 2015-05-15: qty 2

## 2015-05-15 MED ORDER — ROSUVASTATIN CALCIUM 20 MG PO TABS: 20.0000 mg | ORAL_TABLET | Freq: Every day | ORAL | Status: DC

## 2015-05-15 MED ORDER — DIAZEPAM 5 MG PO TABS
ORAL_TABLET | ORAL | Status: AC
  Filled 2015-05-15: qty 1

## 2015-05-15 MED ORDER — MIDAZOLAM HCL 2 MG/2ML IJ SOLN
INTRAMUSCULAR | Status: AC
  Filled 2015-05-15: qty 2

## 2015-05-15 MED ORDER — SODIUM CHLORIDE 0.9 % WEIGHT BASED INFUSION: 3.0000 mL/kg/h | INTRAVENOUS | Status: DC

## 2015-05-15 MED ORDER — SODIUM CHLORIDE 0.9 % WEIGHT BASED INFUSION
3.0000 mL/kg/h | INTRAVENOUS | Status: DC
Start: 2015-05-16 — End: 2015-05-15
  Administered 2015-05-15: 3 mL/kg/h via INTRAVENOUS

## 2015-05-15 MED ORDER — SODIUM CHLORIDE 0.9 % IV SOLN: 250.0000 mL | INTRAVENOUS | Status: DC | PRN

## 2015-05-15 MED ORDER — DIAZEPAM 5 MG PO TABS
5.0000 mg | ORAL_TABLET | Freq: Once | ORAL | Status: AC
  Administered 2015-05-15: 5 mg via ORAL

## 2015-05-15 MED ORDER — HEPARIN (PORCINE) IN NACL 2-0.9 UNIT/ML-% IJ SOLN
INTRAMUSCULAR | Status: DC | PRN
  Administered 2015-05-15: 08:00:00

## 2015-05-15 MED ORDER — SODIUM CHLORIDE 0.9 % IJ SOLN: 3.0000 mL | Freq: Two times a day (BID) | INTRAMUSCULAR | Status: DC

## 2015-05-15 MED ORDER — FENTANYL CITRATE (PF) 100 MCG/2ML IJ SOLN
INTRAMUSCULAR | Status: DC | PRN
  Administered 2015-05-15: 50 ug via INTRAVENOUS

## 2015-05-15 MED ORDER — SODIUM CHLORIDE 0.9 % IJ SOLN: 3.0000 mL | INTRAMUSCULAR | Status: DC | PRN

## 2015-05-15 MED ORDER — HEPARIN (PORCINE) IN NACL 2-0.9 UNIT/ML-% IJ SOLN
INTRAMUSCULAR | Status: AC
  Filled 2015-05-15: qty 1000

## 2015-05-15 MED ORDER — SODIUM CHLORIDE 0.9 % IJ SOLN
3.0000 mL | Freq: Two times a day (BID) | INTRAMUSCULAR | Status: DC
Start: 2015-05-15 — End: 2015-05-15

## 2015-05-15 MED ORDER — SODIUM CHLORIDE 0.9 % WEIGHT BASED INFUSION: 1.0000 mL/kg/h | INTRAVENOUS | Status: DC

## 2015-05-15 MED ORDER — ASPIRIN 81 MG PO CHEW
81.0000 mg | CHEWABLE_TABLET | ORAL | Status: AC
  Administered 2015-05-15: 81 mg via ORAL

## 2015-05-15 MED ORDER — HEPARIN SODIUM (PORCINE) 1000 UNIT/ML IJ SOLN
INTRAMUSCULAR | Status: AC
  Filled 2015-05-15: qty 1

## 2015-05-15 MED ORDER — HEPARIN SODIUM (PORCINE) 1000 UNIT/ML IJ SOLN
INTRAMUSCULAR | Status: DC | PRN
  Administered 2015-05-15: 4500 [IU] via INTRAVENOUS

## 2015-05-15 MED ORDER — VERAPAMIL HCL 2.5 MG/ML IV SOLN
INTRAVENOUS | Status: AC
  Filled 2015-05-15: qty 2

## 2015-05-15 MED ORDER — LIDOCAINE HCL (PF) 1 % IJ SOLN
INTRAMUSCULAR | Status: DC | PRN
  Administered 2015-05-15: 5 mL

## 2015-05-15 MED ORDER — VERAPAMIL HCL 2.5 MG/ML IV SOLN
INTRAVENOUS | Status: DC | PRN
  Administered 2015-05-15: 08:00:00 via INTRA_ARTERIAL

## 2015-05-15 MED ORDER — MIDAZOLAM HCL 2 MG/2ML IJ SOLN
INTRAMUSCULAR | Status: DC | PRN
  Administered 2015-05-15: 2 mg via INTRAVENOUS

## 2015-05-15 SURGICAL SUPPLY — 11 items

## 2015-05-15 NOTE — Discharge Instructions (Addendum)
° ° ° ° °  Radial Site Care Refer to this sheet in the next few weeks. These instructions provide you with information about caring for yourself after your procedure. Your health care provider may also give you more specific instructions. Your treatment has been planned according to current medical practices, but problems sometimes occur. Call your health care provider if you have any problems or questions after your procedure. WHAT TO EXPECT AFTER THE PROCEDURE After your procedure, it is typical to have the following:  Bruising at the radial site that usually fades within 1-2 weeks.  Blood collecting in the tissue (hematoma) that may be painful to the touch. It should usually decrease in size and tenderness within 1-2 weeks. HOME CARE INSTRUCTIONS  Take medicines only as directed by your health care provider.  You may shower 24-48 hours after the procedure or as directed by your health care provider. Remove the bandage (dressing) and gently wash the site with plain soap and water. Pat the area dry with a clean towel. Do not rub the site, because this may cause bleeding.  Do not take baths, swim, or use a hot tub until your health care provider approves.  Check your insertion site every day for redness, swelling, or drainage.  Do not apply powder or lotion to the site.  Do not flex or bend the affected arm for 24 hours or as directed by your health care provider.  Do not push or pull heavy objects with the affected arm for 24 hours or as directed by your health care provider.  Do not lift over 10 lb (4.5 kg) for 5 days after your procedure or as directed by your health care provider.  Ask your health care provider when it is okay to:  Return to work or school.  Resume usual physical activities or sports.  Resume sexual activity.  Do not drive home if you are discharged the same day as the procedure. Have someone else drive you.  You may drive 24 hours after the procedure unless  otherwise instructed by your health care provider.  Do not operate machinery or power tools for 24 hours after the procedure.  If your procedure was done as an outpatient procedure, which means that you went home the same day as your procedure, a responsible adult should be with you for the first 24 hours after you arrive home.  Keep all follow-up visits as directed by your health care provider. This is important. SEEK MEDICAL CARE IF:  You have a fever.  You have chills.  You have increased bleeding from the radial site. Hold pressure on the site and call 911. SEEK IMMEDIATE MEDICAL CARE IF:  You have unusual pain at the radial site.  You have redness, warmth, or swelling at the radial site.  You have drainage (other than a small amount of blood on the dressing) from the radial site.  The radial site is bleeding, and the bleeding does not stop after 30 minutes of holding steady pressure on the site.  Your arm or hand becomes pale, cool, tingly, or numb.   This information is not intended to replace advice given to you by your health care provider. Make sure you discuss any questions you have with your health care provider.   Document Released: 05/29/2010 Document Revised: 05/17/2014 Document Reviewed: 11/12/2013 Elsevier Interactive Patient Education 2016 [place] Crestor 20 mg daily for cholesterol. Prescription called into [facility] pharmacy

## 2015-05-15 NOTE — Interval H&P Note (Signed)
History and Physical Interval Note:  05/15/2015 7:24 AM  Peter Bruce  has presented today for surgery, with the diagnosis of c/p  The various methods of treatment have been discussed with the patient and family. After consideration of risks, benefits and other options for treatment, the patient has consented to  Procedure(s): Left Heart Cath and Coronary Angiography (N/A) as a surgical intervention .  The patient's history has been reviewed, patient examined, no change in status, stable for surgery.  I have reviewed the patient's chart and labs.  Questions were answered to the patient's satisfaction.   Cath Lab Visit (complete for each Cath Lab visit)  Clinical Evaluation Leading to the Procedure:   ACS: No.  Non-ACS:    Anginal Classification: CCS II  Anti-ischemic medical therapy: Minimal Therapy (1 class of medications)  Non-Invasive Test Results: Intermediate-risk stress test findings: cardiac mortality 1-3%/year  Prior CABG: No previous CABG        Peter Bruce [facility] 05/15/2015 7:24 AM

## 2015-05-15 NOTE — H&P (View-Only) (Signed)
Cardiology Office Note   Date:  04/14/2015   ID:  Peter Bruce, DOB 12/11/1950, MRN [id number]  PCP:  [name], MD  Cardiologist:   Elizandro Laura Martinique, MD   Chief Complaint  Patient presents with  . New Patient (Initial Visit)    no chest pain, no shortness of breath, no edema, no pain or cramping in legs, no lightheadedness or dizziness      History of Present Illness: Peter Bruce is a 65 y.o. male who presents for evaluation of fatigue at the request of Dr. Laney Pastor. He has no known history of heart disease. He does report a skipped heart beat noted in the 1960s that kept him out of the [employer]. More recently he has complained of worsening fatigue and weakness with exertion. He denies any chest pain or SOB. Initially he attributed this to his PMR and the fact that he hadn't been taking his steroids as regularly. He got back on 10 mg prednisone without improvement. He mentally felt more foggy. Seen by urology and testosterone levels done. No dizziness or syncope. Saw his Rheumatologist as well. Now can't do even simple tasks like taking out the trash without getting very tired. This is unusual for him. 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 He does complain of recent insomnia. 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 He was treated with Xarelto for 6 months by Dr. Joya Gaskins. No known history of recurrence.     Past Medical History  Diagnosis Date  . Hypertension   . Arthritis   . Neuromuscular disorder ([place])   . PMR (polymyalgia rheumatica) ([facility])   . Acute pulmonary embolus ([place]) 2010  . Hypercholesterolemia   . Atrial fibrillation [facility])     Past Surgical History  Procedure Laterality Date  . Appendectomy    . Hernia repair    . Shoulder surgery    . Arm surgery        Current Outpatient Prescriptions  Medication Sig Dispense Refill  . ALPRAZolam (XANAX) 0.25 MG tablet Take 1 tablet (0.25 mg total) by mouth 1  day or 1 dose. For anxiety or dysphagia 30 tablet 5  . amLODipine (NORVASC) 10 MG tablet One and one-half daily 135 tablet 1  . b complex vitamins tablet Take 1 tablet by mouth daily.    . clonazePAM (KLONOPIN) 0.5 MG tablet Take 1 tablet (0.5 mg total) by mouth 2 (two) times daily as needed (for neuropathic pain). 60 tablet 5  . hydrochlorothiazide (MICROZIDE) 12.5 MG capsule Take 1 capsule (12.5 mg total) by mouth daily. 90 capsule 1  . rivaroxaban (XARELTO) 20 MG TABS tablet Take 1 tablet (20 mg total) by mouth daily with supper. 30 tablet 11  . sildenafil (REVATIO) 20 MG tablet Take 5 tablets by mouth daily as needed for ED. 50 tablet 11  . temazepam (RESTORIL) 30 MG capsule 1 at 7pm to prepare for bed 30 capsule 5  . zolpidem (AMBIEN) 10 MG tablet 1 at bedtime and 1 when wakes at 3am 60 tablet 5   No current facility-administered medications for this visit.    Allergies:   Amitriptyline and Other    Social History:  The patient  reports that he has never smoked. He has never used smokeless tobacco. He reports that he does not drink alcohol or use illicit drugs.   Family History:  The patient's family history includes Cancer in his mother; Other in  his sister.    ROS:  Please see the history of present illness.   Otherwise, review of systems are positive for none.   All other systems are reviewed and negative.    PHYSICAL EXAM: VS:  BP 132/86 mmHg  Pulse 72  Ht 5\' 10"  (1.778 m)  Wt 89.954 kg (198 lb 5 oz)  BMI 28.45 kg/m2 , BMI Body mass index is 28.45 kg/(m^2). GEN: Well nourished, well developed, in no acute distress. He is quite anxious today and much of the history is given by his wife. HEENT: normal Neck: no JVD, carotid bruits, or masses Cardiac: IRRR; no murmurs, rubs, or gallops,no edema  Respiratory:  clear to auscultation bilaterally, normal work of breathing GI: soft, nontender, nondistended, + BS MS: no deformity or atrophy Skin: warm and dry, no rash Neuro:   Strength and sensation are intact Psych: euthymic mood, full affect   EKG:  EKG is ordered today. The ekg ordered today demonstrates Atrial fibrillation with rate 79 bpm. Frequent PVCs with couplets. Diffuse nonspecific ST-T changes.    Recent Labs: 11/13/2014: ALT 40; BUN 32*; Creat 1.15; Hemoglobin 17.0; Platelets 223; Potassium 3.9; Sodium 143; TSH 1.505    Lipid Panel    Component Value Date/Time   CHOL 203* 11/13/2014 1643   TRIG 117 11/13/2014 1643   HDL 54 11/13/2014 1643   CHOLHDL 3.8 11/13/2014 1643   VLDL 23 11/13/2014 1643   LDLCALC 126* 11/13/2014 1643     Lab Results  Component Value Date   WBC 10.9* 11/13/2014   HGB 17.0 11/13/2014   HCT 48.7 11/13/2014   PLT 223 11/13/2014   GLUCOSE 99 11/13/2014   CHOL 203* 11/13/2014   TRIG 117 11/13/2014   HDL 54 11/13/2014   LDLCALC 126* 11/13/2014   ALT 40 11/13/2014   AST 29 11/13/2014   NA 143 11/13/2014   K 3.9 11/13/2014   CL 104 11/13/2014   CREATININE 1.15 11/13/2014   BUN 32* 11/13/2014   CO2 25 11/13/2014   TSH 1.505 11/13/2014   PSA 3.09 12/22/2012   INR 3.2* 07/31/2008   HGBA1C 5.4 02/23/2012     Wt Readings from Last 3 Encounters:  04/14/15 89.954 kg (198 lb 5 oz)  03/26/15 88.905 kg (196 lb)  03/25/15 89.177 kg (196 lb 9.6 oz)      Other studies Reviewed: Additional studies/ records that were reviewed today include: none. Review of the above records demonstrates: NA   ASSESSMENT AND PLAN:  1.  Atrial fibrillation. Rate controlled on no rate slowing medications suggesting some underlying conduction system disease. This may be responsible for his symptoms of fatigue. CHAD-VASC score of 2. Recommend anticoagulation with Xarelto 20 mg daily. Stop ASA and NSAIDs. Will check Echo. Depending on Echo results may consider DC cardioversion after he has been anticoagulated for 4 weeks.   2. Exertional fatigue. May be related to #1. Need to rule out ischemic heart disease with possible anginal  equivalent. Will arrange for a stress Myoview. Assess LV function with Echo and Myoview.  3. PVCs ? Chronic  4. PMR on steroids.  5. History of pulmonary embolus 2010.   6. Hypercholesterolemia. Mild. If myoview is abnormal will recommend statin therapy  7. HTN controlled.    Current medicines are reviewed at length with the patient today.  The patient does not have concerns regarding medicines.  The following changes have been made:  See above  Labs/ tests ordered today include:  Orders Placed This Encounter  Procedures  . Myocardial Perfusion Imaging  . EKG 12-Lead  . ECHOCARDIOGRAM COMPLETE     Disposition:   FU with Dr. Martinique in 4 weeks  Signed, Moises Terpstra Martinique, MD  04/14/2015 8:12 PM    [facility] [address] [place], [place], [address] Phone [phone], Fax [phone]

## 2015-05-16 ENCOUNTER — Institutional Professional Consult (permissible substitution) (INDEPENDENT_AMBULATORY_CARE_PROVIDER_SITE_OTHER): Payer: Medicare Other | Admitting: Thoracic Surgery (Cardiothoracic Vascular Surgery)

## 2015-05-16 ENCOUNTER — Encounter: Payer: Self-pay | Admitting: Thoracic Surgery (Cardiothoracic Vascular Surgery)

## 2015-05-16 ENCOUNTER — Other Ambulatory Visit: Payer: Self-pay | Admitting: *Deleted

## 2015-05-16 VITALS — BP 157/97 | HR 70 | Resp 16 | Ht 70.0 in | Wt 188.0 lb

## 2015-05-16 DIAGNOSIS — I2510 Atherosclerotic heart disease of native coronary artery without angina pectoris: Secondary | ICD-10-CM

## 2015-05-16 DIAGNOSIS — I4891 Unspecified atrial fibrillation: Secondary | ICD-10-CM | POA: Diagnosis not present

## 2015-05-16 DIAGNOSIS — R790 Abnormal level of blood mineral: Secondary | ICD-10-CM

## 2015-05-16 DIAGNOSIS — I25118 Atherosclerotic heart disease of native coronary artery with other forms of angina pectoris: Secondary | ICD-10-CM

## 2015-05-16 DIAGNOSIS — R760 Raised antibody titer: Secondary | ICD-10-CM

## 2015-05-16 MED ORDER — AMIODARONE HCL 200 MG PO TABS: 200.0000 mg | ORAL_TABLET | Freq: Two times a day (BID) | ORAL | Status: DC

## 2015-05-16 NOTE — Patient Instructions (Signed)
Begin taking amiodarone and stop taking Xarelto 7 days prior to surgery (January 12th)  Continue to taper your Prednisone off  Continue taking all other medications without change through the day before surgery.  Have nothing to eat or drink after midnight the night before surgery.  On the morning of surgery take only your Norvasc with a sip of water.  You may also take Xanax if desired.

## 2015-05-16 NOTE — Progress Notes (Signed)
[address]       [place] [address]             [phone]     CARDIOTHORACIC SURGERY CONSULTATION REPORT  Referring Provider is Martinique, Peter M, MD PCP is DOOLITTLE, Linton Ham, MD  Chief Complaint  Patient presents with  . Coronary Artery Disease    eval for CABG...CATH 05/14/14, ECHO 04/29/15  . Atrial Fibrillation  . Fatigue    HPI:  Patient is a 65 year old male with history of hypertension, borderline hyperlipidemia, lupus anticoagulant positive with previous pulmonary embolus, polymyalgia rheumatica, and chronic persistent atrial fibrillation who has been referred for surgical consultation to discuss treatment options for management of newly discovered severe multivessel coronary artery disease. The patient has no previous history of coronary artery disease but his cardiac history dates back at least 6 or 8 years ago when he was diagnosed with atrial fibrillation.  The patient was reportedly evaluated by Dr. Dannielle Burn although no records from that evaluation are currently available. The patient was not on long-term anticoagulation therapy. In 2010 the patient suffered a saddle pulmonary embolism. He was treated for 6 months using Xarelto as part of the [place] trial under the care of Dr. Joya Gaskins.  Once anticoagulation therapy was stopped a hypercoagulation panel documented the presence of lupus anticoagulant positive. With the past year or so the patient has been treated by Dr. Trudie Reed because of symptoms of intermittent diffuse muscle aches and stiffness that has been attributed to likely polymyalgia rheumatica. The patient has been off and on prednisone intermittently. According to the patient and his wife, over the past 6 months or so the patient has suffered from fairly profound exertional fatigue. Early last spring and into the summer the patient experienced several traumatic experiences and personal losses, and his symptoms were initially attributed to possible  depression.  Following urging by the patient's wife the patient was referred for cardiology consultation and initially evaluated by Dr. Martinique on 04/14/2015. The patient was noted to be in persistent atrial fibrillation and started on Xarelto.  Transthoracic echocardiogram was performed demonstrating normal left ventricular systolic function with ejection fraction estimated 65-70%. There were no significant valvular abnormalities and left atrial size was normal.  A nuclear stress test was performed and found to be intermediate risk study for possible reversible ischemia with a small defect of moderate severity in the mid inferior and apical inferior wall as well as a medium-sized defect in the mid anteroseptal wall.  The patient subsequently underwent diagnostic cardiac catheterization by Dr. Martinique and was found to have severe three-vessel coronary artery disease with preserved left ventricular systolic function. The patient was referred for cardiothoracic surgical consultation.  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  The patient has been retired for several years but works every day helping his wife with the kennel and the 3 acres of property they live on. He denies any history of symptoms of exertional shortness of breath or chest discomfort. His primary complaint is that of progressive fatigue and decreased exercise tolerance over the past 6 or 7 months. He has never had any chest pain or chest tightness either with activity or rest. He still exercises fairly regularly, and he is limited primarily by fatigue. He has never had  any PND, orthopnea, or lower extremity edema. He has occasional palpitations without dizzy spells or syncope.   Past Medical History  Diagnosis Date  . Hypertension   . Arthritis   .  Neuromuscular disorder ([facility])   . PMR (polymyalgia rheumatica) ([facility])   . Acute pulmonary embolus ([facility]) 2010  . Hypercholesterolemia   . Atrial fibrillation ([facility])   . Coronary artery disease involving native coronary artery 05/15/2015    multivessel    Past Surgical History  Procedure Laterality Date  . Appendectomy    . Hernia repair    . Shoulder surgery    . Arm surgery     . Cardiac catheterization N/A 05/15/2015    Procedure: Left Heart Cath and Coronary Angiography;  Surgeon: Peter M Martinique, MD;  Location: [facility];  Service: Cardiovascular;  Laterality: N/A;    Family History  Problem Relation Age of Onset  . Cancer Mother   . Other Sister     chf    Social History   Social History  . Marital Status: Married    Spouse Name: N/A  . Number of Children: N/A  . Years of Education: N/A   Occupational History  . licensed Chief Financial Officer     self-employed   Social History Main Topics  . Smoking status: Never Smoker   . Smokeless tobacco: Never Used  . Alcohol Use: No  . Drug Use: No  . Sexual Activity: Not on file   Other Topics Concern  . Not on file   Social History Narrative   Lives with his wife (married 1990).  Former Health visitor. Body building/weight lifting, running for exercise.    Current Outpatient Prescriptions  Medication Sig Dispense Refill  . ALPRAZolam (XANAX) 0.25 MG tablet Take 1 tablet (0.25 mg total) by mouth 1 day or 1 dose. For anxiety or dysphagia (Patient taking differently: Take 0.25 mg by mouth 2 (two) times daily as needed for anxiety. For anxiety or dysphagia) 30 tablet 5  . amLODipine (NORVASC) 10 MG tablet One and one-half daily (Patient taking differently: Take 15 mg by mouth daily. ) 135 tablet 1  . diazepam (VALIUM) 5 MG tablet Take 1 tablet (5 mg total) by mouth at bedtime as needed for anxiety. 30 tablet 2  . predniSONE (DELTASONE) 1 MG tablet Take 8 mg by mouth daily.     . rivaroxaban (XARELTO) 20 MG  TABS tablet Take 1 tablet (20 mg total) by mouth daily with supper. 30 tablet 11  . rosuvastatin (CRESTOR) 20 MG tablet Take 1 tablet (20 mg total) by mouth daily. 90 tablet 3  . sildenafil (REVATIO) 20 MG tablet Take 5 tablets by mouth daily as needed for ED. 50 tablet 11  . temazepam (RESTORIL) 30 MG capsule 1 at 7pm to prepare for bed (Patient taking differently: Take 30 mg by mouth every evening. Take 30mg  at 7pm to prepare for bed) 30 capsule 5  . zolpidem (AMBIEN) 10 MG tablet 1 at bedtime and 1 when wakes at 3am 60 tablet 5   No current facility-administered medications for this visit.    Allergies  Allergen Reactions  . Amitriptyline Shortness Of Breath  . Other     SSRI uptake      Review of Systems:   General:  decreased appetite, decreased energy, no weight gain, + weight loss, no fever  Cardiac:  no chest pain with exertion, no chest pain at rest, no SOB with exertion, no resting SOB, no PND,  no orthopnea, + palpitations, + arrhythmia, + atrial fibrillation, no LE edema, no dizzy spells, no syncope  Respiratory:  no shortness of breath, no home oxygen, no productive cough, + dry cough, no bronchitis, no wheezing, no hemoptysis, no asthma, no pain with inspiration or cough, no sleep apnea, no CPAP at night  GI:   no difficulty swallowing, no reflux, no frequent heartburn, no hiatal hernia, no abdominal pain, no constipation, no diarrhea, no hematochezia, no hematemesis, no melena  GU:   no dysuria,  no frequency, no urinary tract infection, no hematuria, no enlarged prostate, no kidney stones, no kidney disease  Vascular:  no pain suggestive of claudication, no pain in feet, no leg cramps, no varicose veins, no DVT, no non-healing foot ulcer  Neuro:   no stroke, no TIA's, no seizures, no headaches, no temporary blindness one eye,  no slurred speech, no peripheral neuropathy, no chronic pain, no instability of gait, no memory/cognitive dysfunction  Musculoskeletal: no arthritis,  no joint swelling, + myalgias, no difficulty walking, normal mobility   Skin:   no rash, no itching, no skin infections, no pressure sores or ulcerations  Psych:   no anxiety, + depression, no nervousness, + unusual recent stress  Eyes:   no blurry vision, no floaters, no recent vision changes, no wears glasses or contacts  ENT:   no hearing loss, no loose or painful teeth, no dentures, last saw dentist 4 months ago  Hematologic:  no easy bruising, no abnormal bleeding, + possible clotting disorder, no frequent epistaxis  Endocrine:  no diabetes, does not check CBG's at home     Physical Exam:   BP 157/97 mmHg  Pulse 70  Resp 16  Ht 5\' 10"  (1.778 m)  Wt 188 lb (85.276 kg)  BMI 26.98 kg/m2  SpO2 97%  General:    well-appearing  HEENT:  Unremarkable   Neck:   no JVD, no bruits, no adenopathy   Chest:   clear to auscultation, symmetrical breath sounds, no wheezes, no rhonchi   CV:   Irregular rate and rhythm, no murmur   Abdomen:  soft, non-tender, no masses   Extremities:  warm, well-perfused, pulses palpable, no LE edema  Rectal/GU  Deferred  Neuro:   Grossly non-focal and symmetrical throughout  Skin:   Clean and dry, no rashes, no breakdown   Diagnostic Tests:  Transthoracic Echocardiography  Patient:  Veasna, Gorospe MR #:    [id number] Study Date: 04/29/2015 Gender:   M Age:    89 Height:   177.8 cm Weight:   86.2 kg BSA:    2.08 m^2 Pt. Status: Room:  ATTENDING  Peter Martinique, M.D. ORDERING   Peter Martinique, M.D. REFERRING  Peter Martinique, M.D. SONOGRAPHER Cindy Hazy, RDCS PERFORMING  [facility], Outpatient  cc:  ------------------------------------------------------------------- LV EF: 65% -  70%  ------------------------------------------------------------------- Indications:   I48.1 Persistent Atrial Fibrillation.  ------------------------------------------------------------------- History:  PMH: Acquired from the patient  and from the patient&'s chart. PMH: Atrial Fibrillation. Acute Pulmonary Embolism. Risk factors: Hypertension. Hypercholesterolemia.  ------------------------------------------------------------------- Study Conclusions  - Left ventricle: The cavity size was normal. Wall thickness was increased in a pattern of moderate LVH. Systolic function was vigorous. The estimated ejection fraction was in the range of 65% to 70%. Wall motion was normal; there were no regional wall motion abnormalities. The study is not technically sufficient to allow evaluation of LV diastolic function. - Aorta: Ascending aortic diameter: 39 mm (S). - Ascending aorta: The ascending aorta was top normal  in size. - Mitral valve: Mildly thickened leaflets . There was mild regurgitation. - Left atrium: The atrium was normal in size. - Right atrium: The atrium was normal in size. - Inferior vena cava: The vessel was normal in size. The respirophasic diameter changes were in the normal range (>= 50%), consistent with normal central venous pressure.  Impressions:  - LVEF 65-70%, moderate LVH, normal wall motion, top normal ascending aorta at 3.9 cm, mild MR, normal biatrial size, normal IVC.  Transthoracic echocardiography. M-mode, complete 2D, spectral Doppler, and color Doppler. Birthdate: Patient birthdate: [birth year]. Age: Patient is 65 yr old. Sex: Gender: male. BMI: 27.3 kg/m^2. Blood pressure:   132/86 Patient status: Outpatient. Study date: Study date: 04/29/2015. Study time: 11:57 AM. Location: [facility]  -------------------------------------------------------------------  ------------------------------------------------------------------- Left ventricle: The cavity size was normal. Wall thickness was increased in a pattern of moderate LVH. Systolic function was vigorous. The estimated ejection fraction was in the range of 65% to 70%. Wall motion was  normal; there were no regional wall motion abnormalities. The study is not technically sufficient to allow evaluation of LV diastolic function.  ------------------------------------------------------------------- Aortic valve:  Structurally normal valve. Trileaflet. Cusp separation was normal. Doppler: Transvalvular velocity was within the normal range. There was no stenosis. There was no regurgitation.  ------------------------------------------------------------------- Aorta: Aortic root: The aortic root was normal in size. Ascending aorta: The ascending aorta was top normal in size.  ------------------------------------------------------------------- Mitral valve:  Mildly thickened leaflets . Doppler: There was mild regurgitation.  Peak gradient (D): 3 mm Hg.  ------------------------------------------------------------------- Left atrium: The atrium was normal in size.  ------------------------------------------------------------------- Atrial septum: No defect or patent foramen ovale was identified.  ------------------------------------------------------------------- Right ventricle: The cavity size was normal. Wall thickness was normal. Systolic function was normal.  ------------------------------------------------------------------- Pulmonic valve:  The valve appears to be grossly normal. Doppler: There was no significant regurgitation.  ------------------------------------------------------------------- Tricuspid valve:  Doppler: There was no significant regurgitation.  ------------------------------------------------------------------- Pulmonary artery:  The main pulmonary artery was normal-sized.  ------------------------------------------------------------------- Right atrium: The atrium was normal in size.  ------------------------------------------------------------------- Pericardium: There was no pericardial  effusion.  ------------------------------------------------------------------- Systemic veins: Inferior vena cava: The vessel was normal in size. The respirophasic diameter changes were in the normal range (>= 50%), consistent with normal central venous pressure.  ------------------------------------------------------------------- Measurements  Left ventricle             Value    Reference LV ID, ED, PLAX chordal    (H)   55  mm   43 - 52 LV ID, ES, PLAX chordal        34.3 mm   23 - 38 LV fx shortening, PLAX chordal     38  %   >=29 LV PW thickness, ED          12  mm   --------- IVS/LV PW ratio, ED          1.13     <=1.3 Stroke volume, 2D           70  ml   --------- Stroke volume/bsa, 2D         34  ml/m^2 ---------  Ventricular septum           Value    Reference IVS thickness, ED           13.5 mm   ---------  LVOT                  Value  Reference LVOT ID, S               24  mm   --------- LVOT area               4.52 cm^2  --------- LVOT peak velocity, S         91  cm/s  --------- LVOT mean velocity, S         69.1 cm/s  --------- LVOT VTI, S              15.4 cm   --------- LVOT peak gradient, S         3   mm Hg ---------  Aorta                 Value    Reference Aortic root ID, ED           35  mm   --------- Ascending aorta ID, A-P, S       39  mm   ---------  Left atrium              Value    Reference LA ID, A-P, ES             56  mm   --------- LA ID/bsa, A-P         (H)   2.69 cm/m^2 <=2.2 LA volume, S              63  ml   --------- LA volume/bsa, S            30.3 ml/m^2  --------- LA volume, ES, 1-p A4C         55  ml   --------- LA volume/bsa, ES, 1-p A4C       26.5 ml/m^2 --------- LA volume, ES, 1-p A2C         67  ml   --------- LA volume/bsa, ES, 1-p A2C       32.2 ml/m^2 ---------  Mitral valve              Value    Reference Mitral E-wave peak velocity      87.9 cm/s  --------- Mitral deceleration time        162  ms   150 - 230 Mitral peak gradient, D        3   mm Hg ---------  Legend: (L) and (H) mark values outside specified reference range.  ------------------------------------------------------------------- Prepared and Electronically Authenticated by  Lyman Bishop MD 2016-12-20T13:40:47   NUCLEAR STRESS TEST Study Highlights     Defect 1: There is a small defect of moderate severity present in the mid inferior and apical inferior location.  Defect 2: There is a medium defect of mild severity present in the mid anteroseptal location.  Findings consistent with ischemia.  This is an intermediate risk study.  1. Intermediate risk study 2. Ischemia inferior and anteroseptal 3. Non gated study secondary to afib     Nuclear History and Indications    History and Indications Indication for Stress Test: Diagnosis of coronary disease History: AFIB;Hx of PE;No further cardiac or respiratory history reported;No prior NUC MPI for comparison. Cardiac Risk Factors: Hypertension, Lipids, Overweight and PMR  Symptoms: Fatigue and Weakness    Stress Findings    ECG Baseline ECG indicates atrial fibrillation. .   Stress Findings A pharmacological stress test was performed using IV Lexiscan 0.4mg  over 10 seconds performed without concurrent submaximal exercise.  The patient  reported shortness of breath during the stress test. 75mg  dose of IV aminophylline given for symptom relief approximately 2 minutes after stress.  Test was stopped  per protocol.    Stress Measurements    Baseline Vitals  Rest HR 71 bpm    Rest BP 166/117 mmHg    Peak Stress Vitals  Peak HR 103 bpm    Peak BP 185/110 mmHg         Nuclear Stress Measurements    TID 1.2     SSS 10     SRS 4     SDS 6            Nuclear Stress Findings    Isotope administration Rest isotope was administered with an IV injection of 10.5 mCi Tc35m Sestamibi. Rest SPECT images were obtained approximately 45 minutes post tracer injection. Stress isotope was administered with an IV injection of 31.6 mCi Tc90m Sestamibi 20 seconds post IV Lexiscan administration. Stress SPECT images were obtained approximately 60 minutes post tracer injection.   Nuclear Study Quality Overall image quality is excellent.There is no nuclear artifact present.   Nuclear Measurements Study was not gated due to frequent PVCs.   Rest Perfusion There is a defect present in the mid anteroseptal, mid inferior and apical inferior location.   Perfusion Summary Defect 1:  There is a small defect of moderate severity present in the mid inferior and apical inferior location. The defect is reversible.  Defect 2:  There is a medium defect of mild severity present in the mid anteroseptal location. The defect is reversible.   Overall Study Impression Findings consistent with ischemia. This is an intermediate risk study. There is no prior study for comparison.  From: ACCF/SCAI/STS/AATS/AHA/ASNC/HFSA/SCCT 2012 Appropriate Use Criteria for Coronary Revascularization Focused Update    Wall Scoring    Score Index: 0.000 Percent Normal: 0.0%   Other segments could not be evaluated.  Study would not gate secondary to afib          Signed    Electronically signed by Lorretta Harp, MD on 04/22/15 at 1329 EST     Report approved and finalized on 04/22/2015 1329     CARDIAC CATHETERIZATION Procedures    Left Heart Cath and Coronary Angiography    Conclusion      Mid RCA lesion, 70% stenosed.  Mid RCA to Dist RCA lesion, 85% stenosed.  Acute Mrg lesion, 95% stenosed.  Prox LAD to Mid LAD lesion, 90% stenosed.  Mid LAD lesion, 100% stenosed.  1st Diag lesion, 95% stenosed.  Ost 1st Mrg lesion, 80% stenosed.  Mid Cx lesion, 90% stenosed.  3rd Mrg lesion, 85% stenosed.  The left ventricular systolic function is normal.  1. Severe 3 vessel obstructive CAD 2. Low normal LV systolic function  Plan: Patient has severe 3 vessel disease with preserved LV function. Syntax score is high. Recommend CABG with possible MAZE procedure.     Indications    Abnormal nuclear stress test [R93.1 (ICD-10-CM)]    Technique and Indications    Indication: 65 yo WM with exertional fatigue and newly diagnosed atrial fibrillation. Abnormal stress Myoview study.  Procedural Details: The right wrist was prepped, draped, and anesthetized with 1% lidocaine. Using the modified Seldinger technique, a 6 French slender sheath was introduced into the right radial artery. 3 mg of verapamil was administered through the sheath, weight-based unfractionated heparin was administered intravenously. Standard Judkins catheters were used for selective coronary angiography and left ventriculography. Catheter exchanges were  performed over an exchange length guidewire. There were no immediate procedural complications. A TR band was used for radial hemostasis at the completion of the procedure. The patient was transferred to the post catheterization recovery area for further monitoring.  Contrast 80 ccEstimated blood loss <50 mL. There were no immediate complications during the procedure.    Coronary Findings    Dominance: Right   Left Anterior Descending   . Prox LAD to Mid LAD lesion, 90% stenosed. Diffuse.   . Mid LAD lesion, 100% stenosed. Calcified.   . First Diagonal Branch   . 1st Diag lesion, 95% stenosed. Discrete.   . Third Septal Branch   Ost 3rd Sept filled  by collaterals from Acute Mrg.     Left Circumflex   . Mid Cx lesion, 90% stenosed.   . First Obtuse Marginal Branch   . Ost 1st Mrg lesion, 80% stenosed.   . Third Obtuse Marginal Branch   . 3rd Mrg lesion, 85% stenosed. Diffuse.     Right Coronary Artery   . Mid RCA lesion, 70% stenosed. Calcified discrete.   . Mid RCA to Dist RCA lesion, 85% stenosed. Moderately Calcified discrete.   . Acute Marginal Branch   . Acute Mrg lesion, 95% stenosed. Discrete.      Wall Motion                 Left Heart    Left Ventricle The left ventricular size is normal. The left ventricular systolic function is normal. There are wall motion abnormalities in the left ventricle. Mid anterior hypokinesis There are segmental wall motion abnormalities in the left ventricle.    Coronary Diagrams    Diagnostic Diagram            Implants    Name ID Temporary Type Supply   No information to display    PACS Images    Show images for Cardiac catheterization     Link to Procedure Log    Procedure Log      Hemo Data       Most Recent Value   AO Systolic Pressure  [id number] mmHg   AO Diastolic Pressure  87 mmHg   AO Mean  [id number] mmHg   LV Systolic Pressure  [id number] mmHg   LV Diastolic Pressure  2 mmHg   LV EDP  11 mmHg   Arterial Occlusion Pressure Extended Systolic Pressure  0000000 mmHg   Arterial Occlusion Pressure Extended Diastolic Pressure  80 mmHg   Arterial Occlusion Pressure Extended Mean Pressure  98 mmHg   Left Ventricular Apex Extended Systolic Pressure  [id number] mmHg   Left Ventricular Apex Extended Diastolic Pressure  3 mmHg   Left Ventricular Apex Extended EDP Pressure         Impression:  I have personally reviewed the patient's diagnostic cardiac catheterization performed yesterday and the transthoracic echocardiogram performed several weeks ago.  The patient has severe three-vessel coronary artery disease including 90% proximal stenosis of the left  anterior second coronary artery, 100% chronic occlusion of the mid left anterior setting coronary artery with right to left collateral filling of the distal segment, 95% proximal stenosis of the obtuse marginal branch of the left circumflex coronary artery, and serial 70 and 85% lesions in the right coronary system. Left ventricular systolic function remains normal.  The patient denies any symptoms of exertional chest discomfort or shortness of breath but complains of progressive chronic fatigue and exercise intolerance. Although the symptoms are nonspecific, they may  represent this patient's anginal equivalent. The patient also has been in chronic persistent atrial fibrillation for unclear period of time.  I agree the patient would likely benefit from coronary artery bypass grafting in terms of decrease risk of future myocardial infarction and prolonged long-term survival. He may experience symptomatic improvement with decreased fatigue and improved exercise tolerance. He may also benefit from clipping of the left atrial appendage and possible maze procedure.   Plan:  I have reviewed the indications, risks, and potential benefits of coronary artery bypass grafting with the patient and his wife.  Alternative treatment strategies have been discussed, including the relative risks, benefits and long term prognosis associated with medical therapy, percutaneous coronary intervention, and surgical revascularization.  The patient understands and accepts all potential associated risks of surgery including but not limited to risk of death, stroke or other neurologic complication, myocardial infarction, congestive heart failure, respiratory failure, renal failure, bleeding requiring blood transfusion and/or reexploration, aortic dissection or other major vascular complication, arrhythmia, heart block or bradycardia requiring permanent pacemaker, pneumonia, pleural effusion, wound infection, pulmonary embolus or other  thromboembolic complication, chronic pain or other delayed complications related to median sternotomy, or the late recurrence of symptomatic ischemic heart disease and/or congestive heart failure.  The importance of long term risk modification have been emphasized.  The relative risks and benefits of performing a maze procedure at the time of their surgery was discussed at length, including the expected likelihood of long term freedom from recurrent symptomatic atrial fibrillation and/or atrial flutter.    We tentatively plan to proceed with surgery on 05/29/2015. The patient has been instructed to stop taking Xarelto and begin taking amiodarone one week prior to surgery. The patient was also encouraged to continue to taper off his current prednisone regimen.  All questions answered.   I spent in excess of 90 minutes during the conduct of this office consultation and >50% of this time involved direct face-to-face encounter with the patient for counseling and/or coordination of their care.   Valentina Gu. Roxy Manns, MD 05/16/2015 12:28 PM

## 2015-05-18 ENCOUNTER — Encounter: Payer: Self-pay | Admitting: Internal Medicine

## 2015-05-18 ENCOUNTER — Encounter: Payer: Self-pay | Admitting: Cardiology

## 2015-05-19 MED ORDER — DIAZEPAM 10 MG PO TABS: 10.0000 mg | ORAL_TABLET | Freq: Four times a day (QID) | ORAL | Status: DC | PRN

## 2015-05-19 NOTE — Telephone Encounter (Signed)
Meds ordered this encounter  Medications  . diazepam (VALIUM) 10 MG tablet    Sig: Take 1 tablet (10 mg total) by mouth every 6 (six) hours as needed for anxiety.    Dispense:  120 tablet    Refill:  1

## 2015-05-23 ENCOUNTER — Telehealth: Payer: Self-pay | Admitting: Cardiology

## 2015-05-23 NOTE — Telephone Encounter (Signed)
Please call,concerning the person you referred him to about the Xarelto.

## 2015-05-26 NOTE — Telephone Encounter (Signed)
Returned call to patient 05/23/15.Patient stated he needed assistance with Xarelto.Advisied I will speak to our nurse who works with patient assistance and call you back on Monday 05/26/15.  Returned call to patient no answer.[place].

## 2015-05-26 NOTE — H&P (Signed)
[address]       [place] [address]             [phone]          CARDIOTHORACIC SURGERY HISTORY AND PHYSICAL EXAM  Referring Provider is Martinique, Peter M, MD PCP is DOOLITTLE, Linton Ham, MD  Chief Complaint  Patient presents with  . Coronary Artery Disease    eval for CABG...CATH 05/14/14, ECHO 04/29/15  . Atrial Fibrillation  . Fatigue    HPI:  Patient is a 65 year old male with history of hypertension, borderline hyperlipidemia, lupus anticoagulant positive with previous pulmonary embolus, polymyalgia rheumatica, and chronic persistent atrial fibrillation who has been referred for surgical consultation to discuss treatment options for management of newly discovered severe multivessel coronary artery disease. The patient has no previous history of coronary artery disease but his cardiac history dates back at least 6 or 8 years ago when he was diagnosed with atrial fibrillation. The patient was reportedly evaluated by Dr. Dannielle Burn although no records from that evaluation are currently available. The patient was not on long-term anticoagulation therapy. In 2010 the patient suffered a saddle pulmonary embolism. He was treated for 6 months using Xarelto as part of the [place] trial under the care of Dr. Joya Gaskins. Once anticoagulation therapy was stopped a hypercoagulation panel documented the presence of lupus anticoagulant positive. With the past year or so the patient has been treated by Dr. Trudie Reed because of symptoms of intermittent diffuse muscle aches and stiffness that has been attributed to likely polymyalgia rheumatica. The patient has been off and on prednisone intermittently. According to the patient and his wife, over the past 6 months or so the patient has suffered from fairly profound exertional fatigue. Early last spring and into the summer the patient experienced several traumatic experiences and personal losses, and his symptoms were  initially attributed to possible depression. Following urging by the patient's wife the patient was referred for cardiology consultation and initially evaluated by Dr. Martinique on 04/14/2015. The patient was noted to be in persistent atrial fibrillation and started on Xarelto. Transthoracic echocardiogram was performed demonstrating normal left ventricular systolic function with ejection fraction estimated 65-70%. There were no significant valvular abnormalities and left atrial size was normal. A nuclear stress test was performed and found to be intermediate risk study for possible reversible ischemia with a small defect of moderate severity in the mid inferior and apical inferior wall as well as a medium-sized defect in the mid anteroseptal wall. The patient subsequently underwent diagnostic cardiac catheterization by Dr. Martinique and was found to have severe three-vessel coronary artery disease with preserved left ventricular systolic function. The patient was referred for cardiothoracic surgical consultation.  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 The patient has been retired for several years but works every day helping his wife with the kennel and the 3 acres of property they live on. He denies any history of symptoms of exertional shortness of breath or chest discomfort. His primary complaint is that of progressive fatigue and decreased exercise tolerance over the past 6 or 7 months. He has never had any chest pain or chest tightness either with activity or rest. He still exercises fairly regularly, and he is limited primarily by fatigue. He has never had  any PND, orthopnea, or lower extremity edema. He has occasional palpitations without dizzy spells or syncope.   Past Medical History  Diagnosis Date    . Hypertension   . Arthritis   . Neuromuscular disorder ([facility])   . PMR (polymyalgia rheumatica) ([facility])   . Acute pulmonary embolus ([facility]) 2010  . Hypercholesterolemia   . Atrial fibrillation ([facility])   . Coronary artery disease involving native coronary artery 05/15/2015    multivessel    Past Surgical History  Procedure Laterality Date  . Appendectomy    . Hernia repair    . Shoulder surgery    . Arm surgery     . Cardiac catheterization N/A 05/15/2015    Procedure: Left Heart Cath and Coronary Angiography; Surgeon: Peter M Martinique, MD; Location: [facility]; Service: Cardiovascular; Laterality: N/A;    Family History  Problem Relation Age of Onset  . Cancer Mother   . Other Sister     chf    Social History   Social History  . Marital Status: Married    Spouse Name: N/A  . Number of Children: N/A  . Years of Education: N/A   Occupational History  . licensed Chief Financial Officer     self-employed   Social History Main Topics  . Smoking status: Never Smoker   . Smokeless tobacco: Never Used  . Alcohol Use: No  . Drug Use: No  . Sexual Activity: Not on file   Other Topics Concern  . Not on file   Social History Narrative   Lives with his wife (married 1990). Former Health visitor. Body building/weight lifting, running for exercise.    Current Outpatient Prescriptions  Medication Sig Dispense Refill  . ALPRAZolam (XANAX) 0.25 MG tablet Take 1 tablet (0.25 mg total) by mouth 1 day or 1 dose. For anxiety or dysphagia (Patient taking differently: Take 0.25 mg by mouth 2 (two) times daily as needed for anxiety. For anxiety or dysphagia) 30 tablet 5  . amLODipine (NORVASC) 10 MG tablet One and one-half daily (Patient taking differently: Take 15 mg by mouth daily. ) 135 tablet 1  . diazepam (VALIUM) 5 MG tablet  Take 1 tablet (5 mg total) by mouth at bedtime as needed for anxiety. 30 tablet 2  . predniSONE (DELTASONE) 1 MG tablet Take 8 mg by mouth daily.     . rivaroxaban (XARELTO) 20 MG TABS tablet Take 1 tablet (20 mg total) by mouth daily with supper. 30 tablet 11  . rosuvastatin (CRESTOR) 20 MG tablet Take 1 tablet (20 mg total) by mouth daily. 90 tablet 3  . sildenafil (REVATIO) 20 MG tablet Take 5 tablets by mouth daily as needed for ED. 50 tablet 11  . temazepam (RESTORIL) 30 MG capsule 1 at 7pm to prepare for bed (Patient taking differently: Take 30 mg by mouth every evening. Take 30mg  at 7pm to prepare for bed) 30 capsule 5  . zolpidem (AMBIEN) 10 MG tablet 1 at bedtime and 1 when wakes at 3am 60 tablet 5   No current facility-administered medications for this visit.    Allergies  Allergen Reactions  . Amitriptyline Shortness Of Breath  . Other     SSRI uptake      Review of Systems:  General:decreased appetite, decreased energy, no weight gain, + weight loss, no fever Cardiac:no chest pain with exertion, no chest pain at rest, no SOB with exertion, no resting SOB, no PND, no orthopnea, + palpitations, + arrhythmia, + atrial fibrillation, no  LE edema, no dizzy spells, no syncope Respiratory:no shortness of breath, no home oxygen, no productive cough, + dry cough, no bronchitis, no wheezing, no hemoptysis, no asthma, no pain with inspiration or cough, no sleep apnea, no CPAP at night GI:no difficulty swallowing, no reflux, no frequent heartburn, no hiatal hernia, no abdominal pain, no constipation, no diarrhea, no hematochezia, no hematemesis, no melena GU:no dysuria, no frequency, no urinary tract infection, no hematuria, no enlarged prostate, no kidney  stones, no kidney disease Vascular:no pain suggestive of claudication, no pain in feet, no leg cramps, no varicose veins, no DVT, no non-healing foot ulcer Neuro:no stroke, no TIA's, no seizures, no headaches, no temporary blindness one eye, no slurred speech, no peripheral neuropathy, no chronic pain, no instability of gait, no memory/cognitive dysfunction Musculoskeletal:no arthritis, no joint swelling, + myalgias, no difficulty walking, normal mobility  Skin:no rash, no itching, no skin infections, no pressure sores or ulcerations Psych:no anxiety, + depression, no nervousness, + unusual recent stress Eyes:no blurry vision, no floaters, no recent vision changes, no wears glasses or contacts ENT:no hearing loss, no loose or painful teeth, no dentures, last saw dentist 4 months ago Hematologic:no easy bruising, no abnormal bleeding, + possible clotting disorder, no frequent epistaxis Endocrine:no diabetes, does not check CBG's at home   Physical Exam:  BP 157/97 mmHg  Pulse 70  Resp 16  Ht 5\' 10"  (1.778 m)  Wt 188 lb (85.276 kg)  BMI 26.98 kg/m2  SpO2 97% General: well-appearing HEENT:Unremarkable  Neck:no JVD, no bruits, no adenopathy  Chest:clear to auscultation, symmetrical breath sounds, no wheezes, no rhonchi  [id number] rate and rhythm, no murmur  Abdomen:soft, non-tender, no masses   Extremities:warm, well-perfused, pulses palpable, no LE edema Rectal/GUDeferred Neuro:Grossly non-focal and symmetrical throughout Skin:Clean and dry, no rashes, no breakdown   Diagnostic Tests:  Transthoracic Echocardiography  Patient:  Adamo, Merrithew MR #:    [id number] Study Date: 04/29/2015 Gender:   M Age:    61 Height:   177.8 cm Weight:   86.2 kg BSA:    2.08 m^2 Pt. Status: Room:  ATTENDING  Peter Martinique, M.D. ORDERING   Peter Martinique, M.D. REFERRING  Peter Martinique, M.D. SONOGRAPHER Cindy Hazy, RDCS PERFORMING  [facility], Outpatient  cc:  ------------------------------------------------------------------- LV EF: 65% -  70%  ------------------------------------------------------------------- Indications:   I48.1 Persistent Atrial Fibrillation.  ------------------------------------------------------------------- History:  PMH: Acquired from the patient and from the patient&'s chart. PMH: Atrial Fibrillation. Acute Pulmonary Embolism. Risk factors: Hypertension. Hypercholesterolemia.  ------------------------------------------------------------------- Study Conclusions  - Left ventricle: The cavity size was normal. Wall thickness was increased in a pattern of moderate LVH. Systolic function was vigorous. The estimated ejection fraction was in the range of 65% to 70%. Wall motion was normal; there were no regional wall motion abnormalities. The study is not technically sufficient to allow evaluation of LV diastolic function. - Aorta: Ascending aortic diameter: 39 mm (S). - Ascending aorta: The ascending aorta was top normal in size. - Mitral valve: Mildly thickened leaflets . There was mild regurgitation. - Left atrium: The atrium was normal in size. - Right atrium: The  atrium was normal in size. - Inferior vena cava: The vessel was normal in size. The respirophasic diameter changes were in the normal range (>= 50%), consistent with normal central venous pressure.  Impressions:  - LVEF 65-70%, moderate LVH, normal wall motion, top normal ascending aorta at 3.9 cm, mild MR, normal biatrial size, normal IVC.  Transthoracic echocardiography. M-mode, complete 2D, spectral Doppler, and  color Doppler. Birthdate: Patient birthdate: 11-14-1950. Age: Patient is 65 yr old. Sex: Gender: male. BMI: 27.3 kg/m^2. Blood pressure:   132/86 Patient status: Outpatient. Study date: Study date: 04/29/2015. Study time: 11:57 AM. Location: [facility]  -------------------------------------------------------------------  ------------------------------------------------------------------- Left ventricle: The cavity size was normal. Wall thickness was increased in a pattern of moderate LVH. Systolic function was vigorous. The estimated ejection fraction was in the range of 65% to 70%. Wall motion was normal; there were no regional wall motion abnormalities. The study is not technically sufficient to allow evaluation of LV diastolic function.  ------------------------------------------------------------------- Aortic valve:  Structurally normal valve. Trileaflet. Cusp separation was normal. Doppler: Transvalvular velocity was within the normal range. There was no stenosis. There was no regurgitation.  ------------------------------------------------------------------- Aorta: Aortic root: The aortic root was normal in size. Ascending aorta: The ascending aorta was top normal in size.  ------------------------------------------------------------------- Mitral valve:  Mildly thickened leaflets . Doppler: There was mild regurgitation.  Peak gradient (D): 3 mm  Hg.  ------------------------------------------------------------------- Left atrium: The atrium was normal in size.  ------------------------------------------------------------------- Atrial septum: No defect or patent foramen ovale was identified.  ------------------------------------------------------------------- Right ventricle: The cavity size was normal. Wall thickness was normal. Systolic function was normal.  ------------------------------------------------------------------- Pulmonic valve:  The valve appears to be grossly normal. Doppler: There was no significant regurgitation.  ------------------------------------------------------------------- Tricuspid valve:  Doppler: There was no significant regurgitation.  ------------------------------------------------------------------- Pulmonary artery:  The main pulmonary artery was normal-sized.  ------------------------------------------------------------------- Right atrium: The atrium was normal in size.  ------------------------------------------------------------------- Pericardium: There was no pericardial effusion.  ------------------------------------------------------------------- Systemic veins: Inferior vena cava: The vessel was normal in size. The respirophasic diameter changes were in the normal range (>= 50%), consistent with normal central venous pressure.  ------------------------------------------------------------------- Measurements  Left ventricle             Value    Reference LV ID, ED, PLAX chordal    (H)   55  mm   43 - 52 LV ID, ES, PLAX chordal        34.3 mm   23 - 38 LV fx shortening, PLAX chordal     38  %   >=29 LV PW thickness, ED          12  mm   --------- IVS/LV PW ratio, ED          1.13     <=1.3 Stroke volume, 2D           70  ml   --------- Stroke volume/bsa, 2D          34  ml/m^2 ---------  Ventricular septum           Value    Reference IVS thickness, ED           13.5 mm   ---------  LVOT                  Value    Reference LVOT ID, S               24  mm   --------- LVOT area               4.52 cm^2  --------- LVOT peak velocity, S         91  cm/s  --------- LVOT mean velocity, S         69.1 cm/s  --------- LVOT VTI, S  15.4 cm   --------- LVOT peak gradient, S         3   mm Hg ---------  Aorta                 Value    Reference Aortic root ID, ED           35  mm   --------- Ascending aorta ID, A-P, S       39  mm   ---------  Left atrium              Value    Reference LA ID, A-P, ES             56  mm   --------- LA ID/bsa, A-P         (H)   2.69 cm/m^2 <=2.2 LA volume, S              63  ml   --------- LA volume/bsa, S            30.3 ml/m^2 --------- LA volume, ES, 1-p A4C         55  ml   --------- LA volume/bsa, ES, 1-p A4C       26.5 ml/m^2 --------- LA volume, ES, 1-p A2C         67  ml   --------- LA volume/bsa, ES, 1-p A2C       32.2 ml/m^2 ---------  Mitral valve              Value    Reference Mitral E-wave peak velocity      87.9 cm/s  --------- Mitral deceleration time        162  ms   150 - 230 Mitral peak gradient, D        3   mm Hg ---------  Legend: (L) and (H) mark values outside specified reference range.  ------------------------------------------------------------------- Prepared and Electronically Authenticated by  Lyman Bishop MD 2016-12-20T13:40:47   NUCLEAR STRESS TEST Study Highlights     Defect 1: There is a  small defect of moderate severity present in the mid inferior and apical inferior location.  Defect 2: There is a medium defect of mild severity present in the mid anteroseptal location.  Findings consistent with ischemia.  This is an intermediate risk study.  1. Intermediate risk study 2. Ischemia inferior and anteroseptal 3. Non gated study secondary to afib     Nuclear History and Indications    History and Indications Indication for Stress Test: Diagnosis of coronary disease History: AFIB;Hx of PE;No further cardiac or respiratory history reported;No prior NUC MPI for comparison. Cardiac Risk Factors: Hypertension, Lipids, Overweight and PMR  Symptoms: Fatigue and Weakness    Stress Findings    ECG Baseline ECG indicates atrial fibrillation. .   Stress Findings A pharmacological stress test was performed using IV Lexiscan 0.4mg  over 10 seconds performed without concurrent submaximal exercise.  The patient reported shortness of breath during the stress test. 75mg  dose of IV aminophylline given for symptom relief approximately 2 minutes after stress.  Test was stopped per protocol.    Stress Measurements    Baseline Vitals  Rest HR 71 bpm    Rest BP 166/117 mmHg    Peak Stress Vitals  Peak HR 103 bpm    Peak BP 185/110 mmHg         Nuclear Stress Measurements    TID 1.2     SSS 10  SRS 4     SDS 6            Nuclear Stress Findings    Isotope administration Rest isotope was administered with an IV injection of 10.5 mCi Tc60m Sestamibi. Rest SPECT images were obtained approximately 45 minutes post tracer injection. Stress isotope was administered with an IV injection of 31.6 mCi Tc25m Sestamibi 20 seconds post IV Lexiscan administration. Stress SPECT images were obtained approximately 60 minutes post tracer injection.   Nuclear Study Quality Overall image quality is excellent.There is no  nuclear artifact present.   Nuclear Measurements Study was not gated due to frequent PVCs.   Rest Perfusion There is a defect present in the mid anteroseptal, mid inferior and apical inferior location.   Perfusion Summary Defect 1:  There is a small defect of moderate severity present in the mid inferior and apical inferior location. The defect is reversible.  Defect 2:  There is a medium defect of mild severity present in the mid anteroseptal location. The defect is reversible.   Overall Study Impression Findings consistent with ischemia. This is an intermediate risk study. There is no prior study for comparison.  From: ACCF/SCAI/STS/AATS/AHA/ASNC/HFSA/SCCT 2012 Appropriate Use Criteria for Coronary Revascularization Focused Update    Wall Scoring    Score Index: 0.000 Percent Normal: 0.0%   Other segments could not be evaluated.  Study would not gate secondary to afib          Signed    Electronically signed by Lorretta Harp, MD on 04/22/15 at 1329 EST     Report approved and finalized on 04/22/2015 1329     CARDIAC CATHETERIZATION Procedures    Left Heart Cath and Coronary Angiography    Conclusion     Mid RCA lesion, 70% stenosed.  Mid RCA to Dist RCA lesion, 85% stenosed.  Acute Mrg lesion, 95% stenosed.  Prox LAD to Mid LAD lesion, 90% stenosed.  Mid LAD lesion, 100% stenosed.  1st Diag lesion, 95% stenosed.  Ost 1st Mrg lesion, 80% stenosed.  Mid Cx lesion, 90% stenosed.  3rd Mrg lesion, 85% stenosed.  The left ventricular systolic function is normal.  1. Severe 3 vessel obstructive CAD 2. Low normal LV systolic function  Plan: Patient has severe 3 vessel disease with preserved LV function. Syntax score is high. Recommend CABG with possible MAZE procedure.     Indications    Abnormal nuclear stress test [R93.1 (ICD-10-CM)]    Technique and Indications    Indication: 65 yo WM with  exertional fatigue and newly diagnosed atrial fibrillation. Abnormal stress Myoview study.  Procedural Details: The right wrist was prepped, draped, and anesthetized with 1% lidocaine. Using the modified Seldinger technique, a 6 French slender sheath was introduced into the right radial artery. 3 mg of verapamil was administered through the sheath, weight-based unfractionated heparin was administered intravenously. Standard Judkins catheters were used for selective coronary angiography and left ventriculography. Catheter exchanges were performed over an exchange length guidewire. There were no immediate procedural complications. A TR band was used for radial hemostasis at the completion of the procedure. The patient was transferred to the post catheterization recovery area for further monitoring.  Contrast 80 ccEstimated blood loss <50 mL. There were no immediate complications during the procedure.    Coronary Findings    Dominance: Right   Left Anterior Descending   . Prox LAD to Mid LAD lesion, 90% stenosed. Diffuse.   . Mid LAD lesion, 100% stenosed. Calcified.   Marland Kitchen  First Engineer, production   . 1st Diag lesion, 95% stenosed. Discrete.   . Third Septal Branch   Ost 3rd Sept filled by collaterals from Acute Mrg.     Left Circumflex   . Mid Cx lesion, 90% stenosed.   . First Obtuse Marginal Branch   . Ost 1st Mrg lesion, 80% stenosed.   . Third Obtuse Marginal Branch   . 3rd Mrg lesion, 85% stenosed. Diffuse.     Right Coronary Artery   . Mid RCA lesion, 70% stenosed. Calcified discrete.   . Mid RCA to Dist RCA lesion, 85% stenosed. Moderately Calcified discrete.   . Acute Marginal Branch   . Acute Mrg lesion, 95% stenosed. Discrete.      Wall Motion                 Left Heart    Left Ventricle The left ventricular size is normal. The left ventricular systolic function is normal. There are wall motion  abnormalities in the left ventricle. Mid anterior hypokinesis There are segmental wall motion abnormalities in the left ventricle.    Coronary Diagrams    Diagnostic Diagram            Implants    Name ID Temporary Type Supply   No information to display    PACS Images    Show images for Cardiac catheterization     Link to Procedure Log    Procedure Log      Hemo Data       Most Recent Value   AO Systolic Pressure  [id number] mmHg   AO Diastolic Pressure  87 mmHg   AO Mean  [id number] mmHg   LV Systolic Pressure  [id number] mmHg   LV Diastolic Pressure  2 mmHg   LV EDP  11 mmHg   Arterial Occlusion Pressure Extended Systolic Pressure  0000000 mmHg   Arterial Occlusion Pressure Extended Diastolic Pressure  80 mmHg   Arterial Occlusion Pressure Extended Mean Pressure  98 mmHg   Left Ventricular Apex Extended Systolic Pressure  [id number] mmHg   Left Ventricular Apex Extended Diastolic Pressure  3 mmHg   Left Ventricular Apex Extended EDP Pressure         Impression:  I have personally reviewed the patient's diagnostic cardiac catheterization performed yesterday and the transthoracic echocardiogram performed several weeks ago. The patient has severe three-vessel coronary artery disease including 90% proximal stenosis of the left anterior second coronary artery, 100% chronic occlusion of the mid left anterior setting coronary artery with right to left collateral filling of the distal segment, 95% proximal stenosis of the obtuse marginal branch of the left circumflex coronary artery, and serial 70 and 85% lesions in the right coronary system. Left ventricular systolic function remains normal. The patient denies any symptoms of exertional chest discomfort or shortness of breath but complains of progressive chronic fatigue and exercise intolerance. Although the symptoms are nonspecific, they may represent  this patient's anginal equivalent. The patient also has been in chronic persistent atrial fibrillation for unclear period of time. I agree the patient would likely benefit from coronary artery bypass grafting in terms of decrease risk of future myocardial infarction and prolonged long-term survival. He may experience symptomatic improvement with decreased fatigue and improved exercise tolerance. He may also benefit from clipping of the left atrial appendage and possible maze procedure.   Plan:  I have reviewed the indications, risks, and potential benefits of coronary artery bypass grafting with the patient and his wife.  Alternative treatment strategies have been discussed, including the relative risks, benefits and long term prognosis associated with medical therapy, percutaneous coronary intervention, and surgical revascularization. The patient understands and accepts all potential associated risks of surgery including but not limited to risk of death, stroke or other neurologic complication, myocardial infarction, congestive heart failure, respiratory failure, renal failure, bleeding requiring blood transfusion and/or reexploration, aortic dissection or other major vascular complication, arrhythmia, heart block or bradycardia requiring permanent pacemaker, pneumonia, pleural effusion, wound infection, pulmonary embolus or other thromboembolic complication, chronic pain or other delayed complications related to median sternotomy, or the late recurrence of symptomatic ischemic heart disease and/or congestive heart failure. The importance of long term risk modification have been emphasized. The relative risks and benefits of performing a maze procedure at the time of their surgery was discussed at length, including the expected likelihood of long term freedom from recurrent symptomatic atrial fibrillation and/or atrial flutter.   We tentatively plan to proceed with surgery on 05/29/2015. The patient has  been instructed to stop taking Xarelto and begin taking amiodarone one week prior to surgery. The patient was also encouraged to continue to taper off his current prednisone regimen. All questions answered.     Valentina Gu. Roxy Manns, MD 05/16/2015 12:28 PM

## 2015-05-27 ENCOUNTER — Ambulatory Visit (HOSPITAL_BASED_OUTPATIENT_CLINIC_OR_DEPARTMENT_OTHER)
Admission: RE | Admit: 2015-05-27 | Discharge: 2015-05-27 | Disposition: A | Discharge status: Home / Self Care | Payer: Medicare Other | Source: Ambulatory Visit | Attending: Thoracic Surgery (Cardiothoracic Vascular Surgery) | Admitting: Thoracic Surgery (Cardiothoracic Vascular Surgery)

## 2015-05-27 ENCOUNTER — Encounter (HOSPITAL_COMMUNITY)
Admission: RE | Admit: 2015-05-27 | Discharge: 2015-05-27 | Disposition: A | Discharge status: Home / Self Care | Payer: Medicare Other | Source: Ambulatory Visit | Attending: Thoracic Surgery (Cardiothoracic Vascular Surgery) | Admitting: Thoracic Surgery (Cardiothoracic Vascular Surgery)

## 2015-05-27 ENCOUNTER — Ambulatory Visit (HOSPITAL_COMMUNITY)
Admission: RE | Admit: 2015-05-27 | Discharge: 2015-05-27 | Disposition: A | Discharge status: Home / Self Care | Payer: Medicare Other | Source: Ambulatory Visit | Attending: Thoracic Surgery (Cardiothoracic Vascular Surgery) | Admitting: Thoracic Surgery (Cardiothoracic Vascular Surgery)

## 2015-05-27 ENCOUNTER — Encounter (HOSPITAL_COMMUNITY): Payer: Self-pay

## 2015-05-27 VITALS — BP 152/98 | HR 54 | Temp 98.1°F | Resp 18 | Ht 69.5 in | Wt 192.7 lb

## 2015-05-27 DIAGNOSIS — D62 Acute posthemorrhagic anemia: Secondary | ICD-10-CM | POA: Diagnosis not present

## 2015-05-27 DIAGNOSIS — I2510 Atherosclerotic heart disease of native coronary artery without angina pectoris: Secondary | ICD-10-CM

## 2015-05-27 DIAGNOSIS — I4891 Unspecified atrial fibrillation: Secondary | ICD-10-CM | POA: Insufficient documentation

## 2015-05-27 DIAGNOSIS — Z7901 Long term (current) use of anticoagulants: Secondary | ICD-10-CM | POA: Diagnosis not present

## 2015-05-27 DIAGNOSIS — I2582 Chronic total occlusion of coronary artery: Secondary | ICD-10-CM | POA: Diagnosis present

## 2015-05-27 DIAGNOSIS — D6959 Other secondary thrombocytopenia: Secondary | ICD-10-CM | POA: Diagnosis not present

## 2015-05-27 DIAGNOSIS — Z79899 Other long term (current) drug therapy: Secondary | ICD-10-CM

## 2015-05-27 DIAGNOSIS — Z0183 Encounter for blood typing: Secondary | ICD-10-CM | POA: Insufficient documentation

## 2015-05-27 DIAGNOSIS — R9431 Abnormal electrocardiogram [ECG] [EKG]: Secondary | ICD-10-CM

## 2015-05-27 DIAGNOSIS — E119 Type 2 diabetes mellitus without complications: Secondary | ICD-10-CM | POA: Diagnosis present

## 2015-05-27 DIAGNOSIS — I481 Persistent atrial fibrillation: Secondary | ICD-10-CM | POA: Diagnosis not present

## 2015-05-27 DIAGNOSIS — M1990 Unspecified osteoarthritis, unspecified site: Secondary | ICD-10-CM | POA: Diagnosis present

## 2015-05-27 DIAGNOSIS — E663 Overweight: Secondary | ICD-10-CM | POA: Diagnosis present

## 2015-05-27 DIAGNOSIS — Z86711 Personal history of pulmonary embolism: Secondary | ICD-10-CM | POA: Diagnosis not present

## 2015-05-27 DIAGNOSIS — E785 Hyperlipidemia, unspecified: Secondary | ICD-10-CM | POA: Diagnosis present

## 2015-05-27 DIAGNOSIS — I10 Essential (primary) hypertension: Secondary | ICD-10-CM | POA: Diagnosis present

## 2015-05-27 DIAGNOSIS — Z6828 Body mass index (BMI) 28.0-28.9, adult: Secondary | ICD-10-CM | POA: Diagnosis not present

## 2015-05-27 DIAGNOSIS — I517 Cardiomegaly: Secondary | ICD-10-CM

## 2015-05-27 DIAGNOSIS — M353 Polymyalgia rheumatica: Secondary | ICD-10-CM | POA: Diagnosis present

## 2015-05-27 DIAGNOSIS — E7800 Pure hypercholesterolemia, unspecified: Secondary | ICD-10-CM | POA: Diagnosis present

## 2015-05-27 DIAGNOSIS — I080 Rheumatic disorders of both mitral and aortic valves: Secondary | ICD-10-CM | POA: Diagnosis not present

## 2015-05-27 DIAGNOSIS — I25110 Atherosclerotic heart disease of native coronary artery with unstable angina pectoris: Secondary | ICD-10-CM | POA: Diagnosis not present

## 2015-05-27 DIAGNOSIS — J9811 Atelectasis: Secondary | ICD-10-CM | POA: Diagnosis not present

## 2015-05-27 DIAGNOSIS — Z01812 Encounter for preprocedural laboratory examination: Secondary | ICD-10-CM

## 2015-05-27 DIAGNOSIS — Z01818 Encounter for other preprocedural examination: Secondary | ICD-10-CM | POA: Insufficient documentation

## 2015-05-27 DIAGNOSIS — I25118 Atherosclerotic heart disease of native coronary artery with other forms of angina pectoris: Secondary | ICD-10-CM | POA: Diagnosis not present

## 2015-05-27 DIAGNOSIS — F419 Anxiety disorder, unspecified: Secondary | ICD-10-CM | POA: Diagnosis not present

## 2015-05-27 DIAGNOSIS — R0602 Shortness of breath: Secondary | ICD-10-CM | POA: Diagnosis not present

## 2015-05-27 DIAGNOSIS — Z951 Presence of aortocoronary bypass graft: Secondary | ICD-10-CM | POA: Diagnosis not present

## 2015-05-27 DIAGNOSIS — Z4682 Encounter for fitting and adjustment of non-vascular catheter: Secondary | ICD-10-CM | POA: Diagnosis not present

## 2015-05-27 DIAGNOSIS — Z7952 Long term (current) use of systemic steroids: Secondary | ICD-10-CM | POA: Diagnosis not present

## 2015-05-27 DIAGNOSIS — Z888 Allergy status to other drugs, medicaments and biological substances status: Secondary | ICD-10-CM | POA: Diagnosis not present

## 2015-05-27 DIAGNOSIS — E8770 Fluid overload, unspecified: Secondary | ICD-10-CM | POA: Diagnosis not present

## 2015-05-27 DIAGNOSIS — I482 Chronic atrial fibrillation: Secondary | ICD-10-CM | POA: Diagnosis present

## 2015-05-27 DIAGNOSIS — D6862 Lupus anticoagulant syndrome: Secondary | ICD-10-CM | POA: Diagnosis not present

## 2015-05-27 DIAGNOSIS — J90 Pleural effusion, not elsewhere classified: Secondary | ICD-10-CM | POA: Diagnosis not present

## 2015-05-27 HISTORY — DX: Personal history of urinary calculi: Z87.442

## 2015-05-27 LAB — TYPE AND SCREEN
ABO/RH(D): A POS
ANTIBODY SCREEN: NEGATIVE

## 2015-05-27 LAB — URINALYSIS, ROUTINE W REFLEX MICROSCOPIC
BILIRUBIN URINE: NEGATIVE
Glucose, UA: NEGATIVE mg/dL
Hgb urine dipstick: NEGATIVE
KETONES UR: 15 mg/dL — AB
LEUKOCYTES UA: NEGATIVE
NITRITE: NEGATIVE
Protein, ur: NEGATIVE mg/dL
Specific Gravity, Urine: 1.026 (ref 1.005–1.030)
pH: 5.5 (ref 5.0–8.0)

## 2015-05-27 LAB — PULMONARY FUNCTION TEST
DL/VA % pred: 109 %
DL/VA: 4.89 ml/min/mmHg/L
DLCO COR: 31.81 ml/min/mmHg
DLCO UNC % PRED: 111 %
DLCO cor % pred: 107 %
DLCO unc: 33.08 ml/min/mmHg
FEF 25-75 POST: 3.84 L/s
FEF 25-75 PRE: 4.21 L/s
FEF2575-%CHANGE-POST: -8 %
FEF2575-%PRED-POST: 156 %
FEF2575-%PRED-PRE: 171 %
FEV1-%Change-Post: 0 %
FEV1-%PRED-POST: 117 %
FEV1-%Pred-Pre: 117 %
FEV1-PRE: 3.66 L
FEV1-Post: 3.67 L
FEV1FVC-%CHANGE-POST: 3 %
FEV1FVC-%PRED-PRE: 111 %
FEV6-%CHANGE-POST: -2 %
FEV6-%Pred-Post: 107 %
FEV6-%Pred-Pre: 110 %
FEV6-Post: 4.29 L
FEV6-Pre: 4.38 L
FEV6FVC-%Change-Post: 1 %
FEV6FVC-%Pred-Post: 105 %
FEV6FVC-%Pred-Pre: 103 %
FVC-%CHANGE-POST: -3 %
FVC-%PRED-POST: 101 %
FVC-%Pred-Pre: 105 %
FVC-Post: 4.29 L
FVC-Pre: 4.44 L
POST FEV1/FVC RATIO: 85 %
POST FEV6/FVC RATIO: 100 %
PRE FEV1/FVC RATIO: 83 %
Pre FEV6/FVC Ratio: 99 %
RV % pred: 118 %
RV: 2.7 L
TLC % pred: 108 %
TLC: 7.15 L

## 2015-05-27 LAB — CBC
HEMATOCRIT: 45.6 % (ref 39.0–52.0)
Hemoglobin: 16.1 g/dL (ref 13.0–17.0)
MCH: 32.8 pg (ref 26.0–34.0)
MCHC: 35.3 g/dL (ref 30.0–36.0)
MCV: 92.9 fL (ref 78.0–100.0)
Platelets: 167 10*3/uL (ref 150–400)
RBC: 4.91 MIL/uL (ref 4.22–5.81)
RDW: 12.7 % (ref 11.5–15.5)
WBC: 9.3 10*3/uL (ref 4.0–10.5)

## 2015-05-27 LAB — BLOOD GAS, ARTERIAL
ACID-BASE EXCESS: 2.4 mmol/L — AB (ref 0.0–2.0)
BICARBONATE: 26.3 meq/L — AB (ref 20.0–24.0)
FIO2: 0.21
O2 Saturation: 95.4 %
PATIENT TEMPERATURE: 98.6
PCO2 ART: 39.4 mmHg (ref 35.0–45.0)
PO2 ART: 79.1 mmHg — AB (ref 80.0–100.0)
TCO2: 27.5 mmol/L (ref 0–100)
pH, Arterial: 7.439 (ref 7.350–7.450)

## 2015-05-27 LAB — APTT: aPTT: 27 seconds (ref 24–37)

## 2015-05-27 LAB — COMPREHENSIVE METABOLIC PANEL
ALBUMIN: 3.8 g/dL (ref 3.5–5.0)
ALT: 23 U/L (ref 17–63)
AST: 27 U/L (ref 15–41)
Alkaline Phosphatase: 45 U/L (ref 38–126)
Anion gap: 12 (ref 5–15)
BILIRUBIN TOTAL: 0.5 mg/dL (ref 0.3–1.2)
BUN: 26 mg/dL — AB (ref 6–20)
CHLORIDE: 108 mmol/L (ref 101–111)
CO2: 21 mmol/L — ABNORMAL LOW (ref 22–32)
CREATININE: 1.31 mg/dL — AB (ref 0.61–1.24)
Calcium: 9.1 mg/dL (ref 8.9–10.3)
GFR calc Af Amer: >60 mL/min (ref >60)
GFR calc non Af Amer: 55 mL/min — ABNORMAL LOW (ref >60)
GLUCOSE: 93 mg/dL (ref 65–99)
POTASSIUM: 4.3 mmol/L (ref 3.5–5.1)
Sodium: 141 mmol/L (ref 135–145)
TOTAL PROTEIN: 6.7 g/dL (ref 6.5–8.1)

## 2015-05-27 LAB — PROTIME-INR
INR: 1.14 (ref 0.00–1.49)
Prothrombin Time: 14.8 seconds (ref 11.6–15.2)

## 2015-05-27 LAB — SURGICAL PCR SCREEN
MRSA, PCR: NEGATIVE
STAPHYLOCOCCUS AUREUS: NEGATIVE

## 2015-05-27 LAB — ABO/RH: ABO/RH(D): A POS

## 2015-05-27 MED ORDER — ALBUTEROL SULFATE (2.5 MG/3ML) 0.083% IN NEBU
2.5000 mg | INHALATION_SOLUTION | Freq: Once | RESPIRATORY_TRACT | Status: AC
  Administered 2015-05-27: 2.5 mg via RESPIRATORY_TRACT

## 2015-05-27 NOTE — Progress Notes (Signed)
   05/27/15 0935  OBSTRUCTIVE SLEEP APNEA  Have you ever been diagnosed with sleep apnea through a sleep study? No  Do you snore loudly (loud enough to be heard through closed doors)?  1  Do you often feel tired, fatigued, or sleepy during the daytime (such as falling asleep during driving or talking to someone)? 0  Has anyone observed you stop breathing during your sleep? 0  Do you have, or are you being treated for high blood pressure? 1  BMI more than 35 kg/m2? 0  Age > 50 (1-yes) 1  Neck circumference greater than:Male 16 inches or larger, Male 17inches or larger? 1  Male Gender (Yes=1) 1  Obstructive Sleep Apnea Score 5  Score 5 or greater  Results sent to PCP

## 2015-05-27 NOTE — Progress Notes (Signed)
Pre-op Cardiac Surgery  Carotid Findings: Bilateral: No significant (1-39%) ICA stenosis. Antegrade vertebral flow.    Upper Extremity Right Left  Brachial Pressures 160 158  Radial Waveforms Bi Tri  Ulnar Waveforms Tri Tri  Palmar Arch (Allen's Test) Normal  Normal    Findings:  Triphasic pedal artery waveforms.   Landry Mellow, RDMS, RVT 05/27/2015

## 2015-05-27 NOTE — Pre-Procedure Instructions (Signed)
Peter Bruce  05/27/2015      [facility] - [place], [place] [address] Phone: [phone] Fax: [phone]    Your procedure is scheduled on   Thursday  05/29/15  Report to [facility] Admitting at 530 A.M.  Call this number if you have problems the morning of surgery:  [phone]   Remember:  Do not eat food or drink liquids after midnight.  Take these medicines the morning of surgery with A SIP OF WATER   ALPRAZOLAM IF NEEDED, AMLODIPINE (STOP XARELTO, IBUPROFEN/ ADVIL/ MOTRIN/ GOODY PWDERS/ BCS')   Do not wear jewelry, make-up or nail polish.  Do not wear lotions, powders, or perfumes.  You may wear deodorant.  Do not shave 48 hours prior to surgery.  Men may shave face and neck.  Do not bring valuables to the hospital.  [facility] is not responsible for any belongings or valuables.  Contacts, dentures or bridgework may not be worn into surgery.  Leave your suitcase in the car.  After surgery it may be brought to your room.  For patients admitted to the hospital, discharge time will be determined by your treatment team.  Patients discharged the day of surgery will not be allowed to drive home.   Name and phone number of your driver:   Special instructions:  [place] - Preparing for Surgery  Before surgery, you can play an important role.  Because skin is not sterile, your skin needs to be as free of germs as possible.  You can reduce the number of germs on you skin by washing with CHG (chlorahexidine gluconate) soap before surgery.  CHG is an antiseptic cleaner which kills germs and bonds with the skin to continue killing germs even after washing.  Please DO NOT use if you have an allergy to CHG or antibacterial soaps.  If your skin becomes reddened/irritated stop using the CHG and inform your nurse when you arrive at Short Stay.  Do not shave (including legs and underarms) for at  least 48 hours prior to the first CHG shower.  You may shave your face.  Please follow these instructions carefully:   1.  Shower with CHG Soap the night before surgery and the                                morning of Surgery.  2.  If you choose to wash your hair, wash your hair first as usual with your       normal shampoo.  3.  After you shampoo, rinse your hair and body thoroughly to remove the                      Shampoo.  4.  Use CHG as you would any other liquid soap.  You can apply chg directly       to the skin and wash gently with scrungie or a clean washcloth.  5.  Apply the CHG Soap to your body ONLY FROM THE NECK DOWN.        Do not use on open wounds or open sores.  Avoid contact with your eyes,       ears, mouth and genitals (private parts).  Wash genitals (private parts)       with your normal soap.  6.  Wash thoroughly, paying special  attention to the area where your surgery        will be performed.  7.  Thoroughly rinse your body with warm water from the neck down.  8.  DO NOT shower/wash with your normal soap after using and rinsing off       the CHG Soap.  9.  Pat yourself dry with a clean towel.            10.  Wear clean pajamas.            11.  Place clean sheets on your bed the night of your first shower and do not        sleep with pets.  Day of Surgery  Do not apply any lotions/deoderants the morning of surgery.  Please wear clean clothes to the hospital/surgery center.    Please read over the following fact sheets that you were given. Pain Booklet, Coughing and Deep Breathing, Blood Transfusion Information, MRSA Information and Surgical Site Infection Prevention

## 2015-05-28 LAB — HEMOGLOBIN A1C
Hgb A1c MFr Bld: 5.5 % (ref 4.8–5.6)
Mean Plasma Glucose: 111 mg/dL

## 2015-05-28 MED ORDER — SODIUM CHLORIDE 0.9 % IV SOLN
INTRAVENOUS | Status: AC
  Administered 2015-05-29: .5 [IU]/h via INTRAVENOUS
  Filled 2015-05-28: qty 2.5

## 2015-05-28 MED ORDER — DEXTROSE 5 % IV SOLN
1.5000 g | INTRAVENOUS | Status: AC
  Administered 2015-05-29: 1.5 g via INTRAVENOUS
  Administered 2015-05-29: .75 g via INTRAVENOUS
  Filled 2015-05-28 (×2): qty 1.5

## 2015-05-28 MED ORDER — DEXTROSE 5 % IV SOLN
30.0000 ug/min | INTRAVENOUS | Status: AC
  Administered 2015-05-29 (×2): 40 ug/min via INTRAVENOUS
  Filled 2015-05-28: qty 2

## 2015-05-28 MED ORDER — MAGNESIUM SULFATE 50 % IJ SOLN
40.0000 meq | INTRAMUSCULAR | Status: DC
  Filled 2015-05-28: qty 10

## 2015-05-28 MED ORDER — PLASMA-LYTE 148 IV SOLN
INTRAVENOUS | Status: AC
  Administered 2015-05-29: 500 mL
  Filled 2015-05-28: qty 2.5

## 2015-05-28 MED ORDER — SODIUM CHLORIDE 0.9 % IV SOLN
INTRAVENOUS | Status: AC
  Administered 2015-05-29: 69.8 mL/h via INTRAVENOUS
  Filled 2015-05-28: qty 40

## 2015-05-28 MED ORDER — POTASSIUM CHLORIDE 2 MEQ/ML IV SOLN
80.0000 meq | INTRAVENOUS | Status: DC
  Filled 2015-05-28: qty 40

## 2015-05-28 MED ORDER — NITROGLYCERIN IN D5W 200-5 MCG/ML-% IV SOLN
2.0000 ug/min | INTRAVENOUS | Status: AC
  Administered 2015-05-29: 10 ug/min via INTRAVENOUS
  Filled 2015-05-28: qty 250

## 2015-05-28 MED ORDER — EPINEPHRINE HCL 1 MG/ML IJ SOLN
0.0000 ug/min | INTRAVENOUS | Status: DC
  Filled 2015-05-28: qty 4

## 2015-05-28 MED ORDER — DEXTROSE 5 % IV SOLN
750.0000 mg | INTRAVENOUS | Status: DC
  Filled 2015-05-28: qty 750

## 2015-05-28 MED ORDER — DEXMEDETOMIDINE HCL IN NACL 400 MCG/100ML IV SOLN
0.1000 ug/kg/h | INTRAVENOUS | Status: AC
  Administered 2015-05-29: 15:00:00 via INTRAVENOUS
  Administered 2015-05-29: .2 ug/kg/h via INTRAVENOUS
  Filled 2015-05-28: qty 100

## 2015-05-28 MED ORDER — DOPAMINE-DEXTROSE 3.2-5 MG/ML-% IV SOLN
0.0000 ug/kg/min | INTRAVENOUS | Status: DC
  Filled 2015-05-28: qty 250

## 2015-05-28 MED ORDER — VANCOMYCIN HCL 10 G IV SOLR
1250.0000 mg | INTRAVENOUS | Status: AC
  Administered 2015-05-29: 1250 mg via INTRAVENOUS
  Filled 2015-05-28 (×2): qty 1250

## 2015-05-28 MED ORDER — SODIUM CHLORIDE 0.9 % IV SOLN
INTRAVENOUS | Status: DC
  Filled 2015-05-28: qty 30

## 2015-05-28 MED ORDER — VANCOMYCIN HCL 1000 MG IV SOLR
INTRAVENOUS | Status: AC
  Administered 2015-05-29: 1000 mL
  Filled 2015-05-28: qty 1000

## 2015-05-28 NOTE — Telephone Encounter (Signed)
Toma Aran patient assistance form for Xarelto completed and faxed to fax # [phone].

## 2015-05-28 NOTE — Anesthesia Preprocedure Evaluation (Addendum)
Anesthesia Evaluation  Patient identified by MRN, date of birth, ID band Patient awake    Reviewed: Allergy & Precautions, NPO status , Patient's Chart, lab work & pertinent test results  History of Anesthesia Complications Negative for: history of anesthetic complications  Airway Mallampati: II  TM Distance: >3 FB Neck ROM: Full    Dental  (+) Teeth Intact, Dental Advisory Given   Pulmonary PE   Pulmonary exam normal breath sounds clear to auscultation       Cardiovascular Exercise Tolerance: Good hypertension, Pt. on medications + CAD  Normal cardiovascular exam+ dysrhythmias Atrial Fibrillation  Rhythm:Regular Rate:Normal  LHC 05/15/15: Conclusion   Mid RCA lesion, 70% stenosed.  Mid RCA to Dist RCA lesion, 85% stenosed.  Acute Mrg lesion, 95% stenosed.  Prox LAD to Mid LAD lesion, 90% stenosed.  Mid LAD lesion, 100% stenosed.  1st Diag lesion, 95% stenosed.  Ost 1st Mrg lesion, 80% stenosed.  Mid Cx lesion, 90% stenosed.  3rd Mrg lesion, 85% stenosed.  The left ventricular systolic function is normal.  1. Severe 3 vessel obstructive CAD 2. Low normal LV systolic function  Plan: Patient has severe 3 vessel disease with preserved LV function. Syntax score is high. Recommend CABG with possible MAZE procedure.  Echo 04/29/15: Study Conclusions  - Left ventricle: The cavity size was normal. Wall thickness wasincreased in a pattern of moderate LVH. Systolic function wasvigorous. The estimated ejection fraction was in the range of 65%to 70%. Wall motion was normal; there were no regional wallmotion abnormalities. The study is not technically sufficient toallow evaluation of LV diastolic function. - Aorta: Ascending aortic diameter: 39 mm (S). - Ascending aorta: The ascending aorta was top normal in size. - Mitral valve: Mildly thickened leaflets . There was mild regurgitation. - Left atrium: The atrium  was normal in size. - Right atrium: The atrium was normal in size. - Inferior vena cava: The vessel was normal in size. The respirophasic diameter changes were in the normal range (>= 50%),consistent with normal central venous pressure. Impressions: - LVEF 65-70%, moderate LVH, normal wall motion, top normalascending aorta at 3.9 cm, mild MR, normal biatrial size, normalIVC.    Neuro/Psych PSYCHIATRIC DISORDERS Anxiety  Neuromuscular disease    GI/Hepatic negative GI ROS, Neg liver ROS,   Endo/Other  Lupus anticoagulant  Renal/GU negative Renal ROS     Musculoskeletal  (+) Arthritis , Osteoarthritis,    Abdominal   Peds  Hematology negative hematology ROS (+)   Anesthesia Other Findings Day of surgery medications reviewed with the patient.  Reproductive/Obstetrics                          Anesthesia Physical Anesthesia Plan  ASA: IV  Anesthesia Plan: General   Post-op Pain Management:    Induction: Intravenous  Airway Management Planned: Oral ETT  Additional Equipment: Arterial line, PA Cath, TEE, CVP and Ultrasound Guidance Line Placement  Intra-op Plan:   Post-operative Plan: Post-operative intubation/ventilation  Informed Consent: I have reviewed the patients History and Physical, chart, labs and discussed the procedure including the risks, benefits and alternatives for the proposed anesthesia with the patient or authorized representative who has indicated his/her understanding and acceptance.   Dental advisory given  Plan Discussed with: CRNA  Anesthesia Plan Comments: (Risks/benefits of general anesthesia discussed with patient including risk of damage to teeth, lips, gum, and tongue, nausea/vomiting, allergic reactions to medications, and the possibility of heart attack, stroke and death.  All patient questions answered.  Patient wishes to proceed.)        Anesthesia Quick Evaluation

## 2015-05-29 ENCOUNTER — Inpatient Hospital Stay (HOSPITAL_COMMUNITY)
Admission: RE | Admit: 2015-05-29 | Discharge: 2015-06-04 | DRG: 229 | Disposition: A | Discharge status: Home / Self Care | Payer: Medicare Other | Source: Ambulatory Visit | Attending: Thoracic Surgery (Cardiothoracic Vascular Surgery) | Admitting: Thoracic Surgery (Cardiothoracic Vascular Surgery)

## 2015-05-29 ENCOUNTER — Inpatient Hospital Stay (HOSPITAL_COMMUNITY): Payer: Medicare Other | Admitting: Certified Registered Nurse Anesthetist

## 2015-05-29 ENCOUNTER — Inpatient Hospital Stay (HOSPITAL_COMMUNITY)
Admission: RE | Admit: 2015-05-29 | Discharge: 2015-05-29 | Disposition: A | Discharge status: Home / Self Care | Payer: Medicare Other | Source: Ambulatory Visit | Attending: Thoracic Surgery (Cardiothoracic Vascular Surgery) | Admitting: Thoracic Surgery (Cardiothoracic Vascular Surgery)

## 2015-05-29 ENCOUNTER — Inpatient Hospital Stay (HOSPITAL_COMMUNITY): Payer: Medicare Other | Admitting: Emergency Medicine

## 2015-05-29 ENCOUNTER — Encounter (HOSPITAL_COMMUNITY)
Admission: RE | Disposition: A | Discharge status: Home / Self Care | Payer: Medicare Other | Source: Ambulatory Visit | Attending: Thoracic Surgery (Cardiothoracic Vascular Surgery)

## 2015-05-29 ENCOUNTER — Inpatient Hospital Stay (HOSPITAL_COMMUNITY): Payer: Medicare Other

## 2015-05-29 ENCOUNTER — Encounter (HOSPITAL_COMMUNITY): Payer: Self-pay | Admitting: *Deleted

## 2015-05-29 DIAGNOSIS — Z7901 Long term (current) use of anticoagulants: Secondary | ICD-10-CM

## 2015-05-29 DIAGNOSIS — I10 Essential (primary) hypertension: Secondary | ICD-10-CM | POA: Diagnosis present

## 2015-05-29 DIAGNOSIS — F419 Anxiety disorder, unspecified: Secondary | ICD-10-CM | POA: Diagnosis not present

## 2015-05-29 DIAGNOSIS — D62 Acute posthemorrhagic anemia: Secondary | ICD-10-CM | POA: Diagnosis not present

## 2015-05-29 DIAGNOSIS — Z888 Allergy status to other drugs, medicaments and biological substances status: Secondary | ICD-10-CM | POA: Diagnosis not present

## 2015-05-29 DIAGNOSIS — E8770 Fluid overload, unspecified: Secondary | ICD-10-CM | POA: Diagnosis not present

## 2015-05-29 DIAGNOSIS — E119 Type 2 diabetes mellitus without complications: Secondary | ICD-10-CM | POA: Diagnosis present

## 2015-05-29 DIAGNOSIS — Z951 Presence of aortocoronary bypass graft: Secondary | ICD-10-CM

## 2015-05-29 DIAGNOSIS — E663 Overweight: Secondary | ICD-10-CM | POA: Diagnosis present

## 2015-05-29 DIAGNOSIS — M353 Polymyalgia rheumatica: Secondary | ICD-10-CM | POA: Diagnosis present

## 2015-05-29 DIAGNOSIS — R0602 Shortness of breath: Secondary | ICD-10-CM | POA: Diagnosis not present

## 2015-05-29 DIAGNOSIS — I25118 Atherosclerotic heart disease of native coronary artery with other forms of angina pectoris: Secondary | ICD-10-CM | POA: Diagnosis not present

## 2015-05-29 DIAGNOSIS — M1990 Unspecified osteoarthritis, unspecified site: Secondary | ICD-10-CM | POA: Diagnosis present

## 2015-05-29 DIAGNOSIS — Z7952 Long term (current) use of systemic steroids: Secondary | ICD-10-CM

## 2015-05-29 DIAGNOSIS — I482 Chronic atrial fibrillation: Secondary | ICD-10-CM | POA: Diagnosis present

## 2015-05-29 DIAGNOSIS — D6862 Lupus anticoagulant syndrome: Secondary | ICD-10-CM | POA: Diagnosis present

## 2015-05-29 DIAGNOSIS — D6959 Other secondary thrombocytopenia: Secondary | ICD-10-CM | POA: Diagnosis not present

## 2015-05-29 DIAGNOSIS — Z9889 Other specified postprocedural states: Secondary | ICD-10-CM

## 2015-05-29 DIAGNOSIS — I4891 Unspecified atrial fibrillation: Secondary | ICD-10-CM | POA: Diagnosis not present

## 2015-05-29 DIAGNOSIS — J9811 Atelectasis: Secondary | ICD-10-CM | POA: Diagnosis not present

## 2015-05-29 DIAGNOSIS — I2510 Atherosclerotic heart disease of native coronary artery without angina pectoris: Secondary | ICD-10-CM | POA: Diagnosis present

## 2015-05-29 DIAGNOSIS — E7800 Pure hypercholesterolemia, unspecified: Secondary | ICD-10-CM | POA: Diagnosis present

## 2015-05-29 DIAGNOSIS — I119 Hypertensive heart disease without heart failure: Secondary | ICD-10-CM | POA: Diagnosis present

## 2015-05-29 DIAGNOSIS — I480 Paroxysmal atrial fibrillation: Secondary | ICD-10-CM | POA: Diagnosis present

## 2015-05-29 DIAGNOSIS — I481 Persistent atrial fibrillation: Secondary | ICD-10-CM | POA: Diagnosis present

## 2015-05-29 DIAGNOSIS — E785 Hyperlipidemia, unspecified: Secondary | ICD-10-CM | POA: Diagnosis present

## 2015-05-29 DIAGNOSIS — Z8679 Personal history of other diseases of the circulatory system: Secondary | ICD-10-CM

## 2015-05-29 DIAGNOSIS — I2582 Chronic total occlusion of coronary artery: Secondary | ICD-10-CM | POA: Diagnosis present

## 2015-05-29 DIAGNOSIS — Z79899 Other long term (current) drug therapy: Secondary | ICD-10-CM

## 2015-05-29 DIAGNOSIS — Z86711 Personal history of pulmonary embolism: Secondary | ICD-10-CM

## 2015-05-29 DIAGNOSIS — Z6828 Body mass index (BMI) 28.0-28.9, adult: Secondary | ICD-10-CM | POA: Diagnosis not present

## 2015-05-29 DIAGNOSIS — J90 Pleural effusion, not elsewhere classified: Secondary | ICD-10-CM | POA: Diagnosis not present

## 2015-05-29 DIAGNOSIS — Z4682 Encounter for fitting and adjustment of non-vascular catheter: Secondary | ICD-10-CM | POA: Diagnosis not present

## 2015-05-29 DIAGNOSIS — I080 Rheumatic disorders of both mitral and aortic valves: Secondary | ICD-10-CM | POA: Diagnosis not present

## 2015-05-29 DIAGNOSIS — I25110 Atherosclerotic heart disease of native coronary artery with unstable angina pectoris: Secondary | ICD-10-CM | POA: Diagnosis not present

## 2015-05-29 HISTORY — DX: Presence of aortocoronary bypass graft: Z95.1

## 2015-05-29 HISTORY — PX: CLIPPING OF ATRIAL APPENDAGE: SHX5773

## 2015-05-29 HISTORY — DX: Personal history of other diseases of the circulatory system: Z86.79

## 2015-05-29 HISTORY — PX: CORONARY ARTERY BYPASS GRAFT: SHX141

## 2015-05-29 HISTORY — PX: TEE WITHOUT CARDIOVERSION: SHX5443

## 2015-05-29 HISTORY — PX: MAZE: SHX5063

## 2015-05-29 HISTORY — DX: Other specified postprocedural states: Z98.890

## 2015-05-29 LAB — POCT I-STAT, CHEM 8
BUN: 22 mg/dL — ABNORMAL HIGH (ref 6–20)
BUN: 18 mg/dL (ref 6–20)
BUN: 20 mg/dL (ref 6–20)
BUN: 19 mg/dL (ref 6–20)
BUN: 21 mg/dL — ABNORMAL HIGH (ref 6–20)
BUN: 18 mg/dL (ref 6–20)
CALCIUM ION: 1.21 mmol/L (ref 1.13–1.30)
CALCIUM ION: 1.02 mmol/L — AB (ref 1.13–1.30)
CALCIUM ION: 1.22 mmol/L (ref 1.13–1.30)
CALCIUM ION: 1.11 mmol/L — AB (ref 1.13–1.30)
CALCIUM ION: 1.01 mmol/L — AB (ref 1.13–1.30)
CHLORIDE: 99 mmol/L — AB (ref 101–111)
CHLORIDE: 104 mmol/L (ref 101–111)
CREATININE: 0.9 mg/dL (ref 0.61–1.24)
CREATININE: 0.8 mg/dL (ref 0.61–1.24)
Calcium, Ion: 1.13 mmol/L (ref 1.13–1.30)
Chloride: 103 mmol/L (ref 101–111)
Chloride: 105 mmol/L (ref 101–111)
Chloride: 104 mmol/L (ref 101–111)
Chloride: 105 mmol/L (ref 101–111)
Creatinine, Ser: 0.7 mg/dL (ref 0.61–1.24)
Creatinine, Ser: 0.7 mg/dL (ref 0.61–1.24)
Creatinine, Ser: 0.8 mg/dL (ref 0.61–1.24)
Creatinine, Ser: 0.8 mg/dL (ref 0.61–1.24)
GLUCOSE: 107 mg/dL — AB (ref 65–99)
GLUCOSE: 101 mg/dL — AB (ref 65–99)
GLUCOSE: 91 mg/dL (ref 65–99)
GLUCOSE: 111 mg/dL — AB (ref 65–99)
GLUCOSE: 129 mg/dL — AB (ref 65–99)
GLUCOSE: 110 mg/dL — AB (ref 65–99)
HCT: 39 % (ref 39.0–52.0)
HCT: 32 % — ABNORMAL LOW (ref 39.0–52.0)
HCT: 41 % (ref 39.0–52.0)
HCT: 35 % — ABNORMAL LOW (ref 39.0–52.0)
HCT: 35 % — ABNORMAL LOW (ref 39.0–52.0)
HCT: 30 % — ABNORMAL LOW (ref 39.0–52.0)
HEMOGLOBIN: 13.3 g/dL (ref 13.0–17.0)
HEMOGLOBIN: 13.9 g/dL (ref 13.0–17.0)
HEMOGLOBIN: 11.9 g/dL — AB (ref 13.0–17.0)
HEMOGLOBIN: 10.2 g/dL — AB (ref 13.0–17.0)
Hemoglobin: 10.9 g/dL — ABNORMAL LOW (ref 13.0–17.0)
Hemoglobin: 11.9 g/dL — ABNORMAL LOW (ref 13.0–17.0)
POTASSIUM: 4.5 mmol/L (ref 3.5–5.1)
POTASSIUM: 4.1 mmol/L (ref 3.5–5.1)
Potassium: 3.4 mmol/L — ABNORMAL LOW (ref 3.5–5.1)
Potassium: 3.8 mmol/L (ref 3.5–5.1)
Potassium: 3.8 mmol/L (ref 3.5–5.1)
Potassium: 4.5 mmol/L (ref 3.5–5.1)
SODIUM: 140 mmol/L (ref 135–145)
SODIUM: 141 mmol/L (ref 135–145)
Sodium: 142 mmol/L (ref 135–145)
Sodium: 143 mmol/L (ref 135–145)
Sodium: 141 mmol/L (ref 135–145)
Sodium: 142 mmol/L (ref 135–145)
TCO2: 27 mmol/L (ref 0–100)
TCO2: 30 mmol/L (ref 0–100)
TCO2: 30 mmol/L (ref 0–100)
TCO2: 28 mmol/L (ref 0–100)
TCO2: 30 mmol/L (ref 0–100)
TCO2: 28 mmol/L (ref 0–100)

## 2015-05-29 LAB — POCT I-STAT 4, (NA,K, GLUC, HGB,HCT)
GLUCOSE: 123 mg/dL — AB (ref 65–99)
HEMATOCRIT: 40 % (ref 39.0–52.0)
HEMOGLOBIN: 13.6 g/dL (ref 13.0–17.0)
Potassium: 3.9 mmol/L (ref 3.5–5.1)
Sodium: 142 mmol/L (ref 135–145)

## 2015-05-29 LAB — POCT I-STAT 3, ART BLOOD GAS (G3+)
Acid-Base Excess: 4 mmol/L — ABNORMAL HIGH (ref 0.0–2.0)
BICARBONATE: 23.4 meq/L (ref 20.0–24.0)
Bicarbonate: 29.3 mEq/L — ABNORMAL HIGH (ref 20.0–24.0)
O2 Saturation: 100 %
O2 Saturation: 99 %
PCO2 ART: 32 mmHg — AB (ref 35.0–45.0)
PH ART: 7.397 (ref 7.350–7.450)
PH ART: 7.472 — AB (ref 7.350–7.450)
PO2 ART: 121 mmHg — AB (ref 80.0–100.0)
Patient temperature: 37
TCO2: 31 mmol/L (ref 0–100)
TCO2: 24 mmol/L (ref 0–100)
pCO2 arterial: 47.6 mmHg — ABNORMAL HIGH (ref 35.0–45.0)
pO2, Arterial: 308 mmHg — ABNORMAL HIGH (ref 80.0–100.0)

## 2015-05-29 LAB — CBC
HEMATOCRIT: 39.9 % (ref 39.0–52.0)
HEMATOCRIT: 32.6 % — AB (ref 39.0–52.0)
HEMOGLOBIN: 14 g/dL (ref 13.0–17.0)
Hemoglobin: 10.9 g/dL — ABNORMAL LOW (ref 13.0–17.0)
MCH: 32.2 pg (ref 26.0–34.0)
MCH: 31.6 pg (ref 26.0–34.0)
MCHC: 35.1 g/dL (ref 30.0–36.0)
MCHC: 33.4 g/dL (ref 30.0–36.0)
MCV: 91.7 fL (ref 78.0–100.0)
MCV: 94.5 fL (ref 78.0–100.0)
PLATELETS: 91 10*3/uL — AB (ref 150–400)
PLATELETS: 71 10*3/uL — AB (ref 150–400)
RBC: 4.35 MIL/uL (ref 4.22–5.81)
RBC: 3.45 MIL/uL — ABNORMAL LOW (ref 4.22–5.81)
RDW: 12.6 % (ref 11.5–15.5)
RDW: 12.9 % (ref 11.5–15.5)
WBC: 15.2 10*3/uL — AB (ref 4.0–10.5)
WBC: 9.9 10*3/uL (ref 4.0–10.5)

## 2015-05-29 LAB — GLUCOSE, CAPILLARY
Glucose-Capillary: 122 mg/dL — ABNORMAL HIGH (ref 65–99)
Glucose-Capillary: 124 mg/dL — ABNORMAL HIGH (ref 65–99)
Glucose-Capillary: 104 mg/dL — ABNORMAL HIGH (ref 65–99)
Glucose-Capillary: 117 mg/dL — ABNORMAL HIGH (ref 65–99)

## 2015-05-29 LAB — MAGNESIUM: MAGNESIUM: 2.3 mg/dL (ref 1.7–2.4)

## 2015-05-29 LAB — PROTIME-INR
INR: 1.57 — ABNORMAL HIGH (ref 0.00–1.49)
Prothrombin Time: 18.8 seconds — ABNORMAL HIGH (ref 11.6–15.2)

## 2015-05-29 LAB — CREATININE, SERUM
CREATININE: 1.03 mg/dL (ref 0.61–1.24)
GFR calc Af Amer: >60 mL/min (ref >60)
GFR calc non Af Amer: >60 mL/min (ref >60)

## 2015-05-29 LAB — PLATELET COUNT: PLATELETS: 109 10*3/uL — AB (ref 150–400)

## 2015-05-29 LAB — HEMOGLOBIN AND HEMATOCRIT, BLOOD
HEMATOCRIT: 34.5 % — AB (ref 39.0–52.0)
HEMOGLOBIN: 11.9 g/dL — AB (ref 13.0–17.0)

## 2015-05-29 LAB — APTT: APTT: 34 s (ref 24–37)

## 2015-05-29 SURGERY — CORONARY ARTERY BYPASS GRAFTING (CABG)
Anesthesia: General | Site: Chest

## 2015-05-29 MED ORDER — NITROGLYCERIN 0.2 MG/ML ON CALL CATH LAB
INTRAVENOUS | Status: DC | PRN
  Administered 2015-05-29 (×2): 40 ug via INTRAVENOUS

## 2015-05-29 MED ORDER — VECURONIUM BROMIDE 10 MG IV SOLR
INTRAVENOUS | Status: AC
  Filled 2015-05-29: qty 10

## 2015-05-29 MED ORDER — SODIUM CHLORIDE 0.9 % IV SOLN: 250.0000 mL | INTRAVENOUS | Status: DC

## 2015-05-29 MED ORDER — PHENYLEPHRINE HCL 10 MG/ML IJ SOLN
0.0000 ug/min | INTRAVENOUS | Status: DC
  Filled 2015-05-29 (×2): qty 2

## 2015-05-29 MED ORDER — INSULIN REGULAR BOLUS VIA INFUSION
0.0000 [IU] | Freq: Three times a day (TID) | INTRAVENOUS | Status: DC
  Filled 2015-05-29: qty 10

## 2015-05-29 MED ORDER — PROPOFOL 10 MG/ML IV BOLUS
INTRAVENOUS | Status: AC
  Filled 2015-05-29: qty 20

## 2015-05-29 MED ORDER — SODIUM CHLORIDE 0.9 % IV SOLN: INTRAVENOUS | Status: DC

## 2015-05-29 MED ORDER — DEXMEDETOMIDINE HCL IN NACL 200 MCG/50ML IV SOLN
INTRAVENOUS | Status: AC
  Filled 2015-05-29: qty 50

## 2015-05-29 MED ORDER — DEXMEDETOMIDINE HCL IN NACL 200 MCG/50ML IV SOLN: 0.0000 ug/kg/h | INTRAVENOUS | Status: DC

## 2015-05-29 MED ORDER — LACTATED RINGERS IV SOLN
INTRAVENOUS | Status: DC
  Administered 2015-05-29: 15:00:00 via INTRAVENOUS

## 2015-05-29 MED ORDER — ACETAMINOPHEN 160 MG/5ML PO SOLN: 1000.0000 mg | Freq: Four times a day (QID) | ORAL | Status: DC

## 2015-05-29 MED ORDER — SODIUM CHLORIDE 0.9 % IJ SOLN
OROMUCOSAL | Status: DC | PRN
  Administered 2015-05-29 (×3): 4 mL via TOPICAL

## 2015-05-29 MED ORDER — CHLORHEXIDINE GLUCONATE 0.12% ORAL RINSE (MEDLINE KIT)
15.0000 mL | Freq: Two times a day (BID) | OROMUCOSAL | Status: DC
  Administered 2015-05-30: 15 mL via OROMUCOSAL

## 2015-05-29 MED ORDER — SODIUM CHLORIDE 0.9 % IJ SOLN
3.0000 mL | Freq: Two times a day (BID) | INTRAMUSCULAR | Status: DC
  Administered 2015-05-30 – 2015-06-01 (×5): 3 mL via INTRAVENOUS

## 2015-05-29 MED ORDER — PHENYLEPHRINE 40 MCG/ML (10ML) SYRINGE FOR IV PUSH (FOR BLOOD PRESSURE SUPPORT)
PREFILLED_SYRINGE | INTRAVENOUS | Status: AC
  Filled 2015-05-29: qty 10

## 2015-05-29 MED ORDER — INSULIN ASPART 100 UNIT/ML ~~LOC~~ SOLN
0.0000 [IU] | SUBCUTANEOUS | Status: DC
  Administered 2015-05-30 (×4): 2 [IU] via SUBCUTANEOUS
  Administered 2015-05-31: 3 [IU] via SUBCUTANEOUS

## 2015-05-29 MED ORDER — ACETAMINOPHEN 650 MG RE SUPP
650.0000 mg | Freq: Once | RECTAL | Status: AC
  Administered 2015-05-29: 650 mg via RECTAL

## 2015-05-29 MED ORDER — VANCOMYCIN HCL IN DEXTROSE 1-5 GM/200ML-% IV SOLN
1000.0000 mg | Freq: Once | INTRAVENOUS | Status: AC
  Administered 2015-05-29: 1000 mg via INTRAVENOUS
  Filled 2015-05-29: qty 200

## 2015-05-29 MED ORDER — DOCUSATE SODIUM 100 MG PO CAPS
200.0000 mg | ORAL_CAPSULE | Freq: Every day | ORAL | Status: DC
  Administered 2015-05-30 – 2015-05-31 (×2): 200 mg via ORAL
  Filled 2015-05-29 (×2): qty 2

## 2015-05-29 MED ORDER — DEXTROSE 5 % IV SOLN
1.5000 g | Freq: Two times a day (BID) | INTRAVENOUS | Status: AC
  Administered 2015-05-29 – 2015-05-31 (×4): 1.5 g via INTRAVENOUS
  Filled 2015-05-29 (×4): qty 1.5

## 2015-05-29 MED ORDER — FENTANYL CITRATE (PF) 100 MCG/2ML IJ SOLN
INTRAMUSCULAR | Status: DC | PRN
  Administered 2015-05-29 (×4): 100 ug via INTRAVENOUS
  Administered 2015-05-29: 50 ug via INTRAVENOUS
  Administered 2015-05-29: 250 ug via INTRAVENOUS
  Administered 2015-05-29: 50 ug via INTRAVENOUS
  Administered 2015-05-29 (×5): 100 ug via INTRAVENOUS
  Administered 2015-05-29: 250 ug via INTRAVENOUS

## 2015-05-29 MED ORDER — OXYCODONE HCL 5 MG PO TABS
5.0000 mg | ORAL_TABLET | ORAL | Status: DC | PRN
  Administered 2015-05-30: 5 mg via ORAL
  Administered 2015-05-31: 10 mg via ORAL
  Administered 2015-05-31 – 2015-06-01 (×3): 5 mg via ORAL
  Administered 2015-06-01 (×2): 10 mg via ORAL
  Administered 2015-06-01: 5 mg via ORAL
  Administered 2015-06-02: 10 mg via ORAL
  Administered 2015-06-02 – 2015-06-03 (×4): 5 mg via ORAL
  Administered 2015-06-03 (×2): 10 mg via ORAL
  Filled 2015-05-29 (×3): qty 1
  Filled 2015-05-29 (×2): qty 2
  Filled 2015-05-29 (×3): qty 1
  Filled 2015-05-29 (×3): qty 2
  Filled 2015-05-29: qty 1
  Filled 2015-05-29: qty 2
  Filled 2015-05-29 (×2): qty 1

## 2015-05-29 MED ORDER — CHLORHEXIDINE GLUCONATE 0.12 % MT SOLN: 15.0000 mL | Freq: Once | OROMUCOSAL | Status: DC

## 2015-05-29 MED ORDER — ONDANSETRON HCL 4 MG/2ML IJ SOLN
4.0000 mg | Freq: Four times a day (QID) | INTRAMUSCULAR | Status: DC | PRN
  Filled 2015-05-29: qty 2

## 2015-05-29 MED ORDER — POTASSIUM CHLORIDE 10 MEQ/50ML IV SOLN
10.0000 meq | INTRAVENOUS | Status: AC
  Administered 2015-05-29 (×3): 10 meq via INTRAVENOUS

## 2015-05-29 MED ORDER — ASPIRIN EC 325 MG PO TBEC
325.0000 mg | DELAYED_RELEASE_TABLET | Freq: Every day | ORAL | Status: DC
  Administered 2015-05-30 – 2015-05-31 (×2): 325 mg via ORAL
  Filled 2015-05-29 (×2): qty 1

## 2015-05-29 MED ORDER — METOPROLOL TARTRATE 12.5 MG HALF TABLET: 12.5000 mg | ORAL_TABLET | Freq: Two times a day (BID) | ORAL | Status: DC

## 2015-05-29 MED ORDER — ANTISEPTIC ORAL RINSE SOLUTION (CORINZ)
7.0000 mL | OROMUCOSAL | Status: DC
  Administered 2015-05-29 – 2015-05-30 (×9): 7 mL via OROMUCOSAL

## 2015-05-29 MED ORDER — LACTATED RINGERS IV SOLN
INTRAVENOUS | Status: DC | PRN
  Administered 2015-05-29 (×6): via INTRAVENOUS

## 2015-05-29 MED ORDER — ACETAMINOPHEN 160 MG/5ML PO SOLN: 650.0000 mg | Freq: Once | ORAL | Status: AC

## 2015-05-29 MED ORDER — FENTANYL CITRATE (PF) 250 MCG/5ML IJ SOLN
INTRAMUSCULAR | Status: AC
  Filled 2015-05-29: qty 5

## 2015-05-29 MED ORDER — SODIUM CHLORIDE 0.9 % IV SOLN
INTRAVENOUS | Status: DC
  Filled 2015-05-29: qty 2.5

## 2015-05-29 MED ORDER — CHLORHEXIDINE GLUCONATE 0.12 % MT SOLN
15.0000 mL | OROMUCOSAL | Status: AC
  Administered 2015-05-29: 15 mL via OROMUCOSAL

## 2015-05-29 MED ORDER — SODIUM CHLORIDE 0.9 % IJ SOLN: 3.0000 mL | INTRAMUSCULAR | Status: DC | PRN

## 2015-05-29 MED ORDER — PANTOPRAZOLE SODIUM 40 MG PO TBEC
40.0000 mg | DELAYED_RELEASE_TABLET | Freq: Every day | ORAL | Status: DC
  Administered 2015-05-31 – 2015-06-03 (×4): 40 mg via ORAL
  Filled 2015-05-29 (×4): qty 1

## 2015-05-29 MED ORDER — SODIUM CHLORIDE 0.9 % IV SOLN
INTRAVENOUS | Status: DC
  Administered 2015-05-29: 15:00:00 via INTRAVENOUS

## 2015-05-29 MED ORDER — MIDAZOLAM HCL 10 MG/2ML IJ SOLN
INTRAMUSCULAR | Status: AC
  Filled 2015-05-29: qty 2

## 2015-05-29 MED ORDER — METOPROLOL TARTRATE 25 MG/10 ML ORAL SUSPENSION
12.5000 mg | Freq: Two times a day (BID) | ORAL | Status: DC
Start: 2015-05-29 — End: 2015-05-30

## 2015-05-29 MED ORDER — ALBUMIN HUMAN 5 % IV SOLN
250.0000 mL | INTRAVENOUS | Status: AC | PRN
  Administered 2015-05-29 (×2): 250 mL via INTRAVENOUS

## 2015-05-29 MED ORDER — MORPHINE SULFATE (PF) 2 MG/ML IV SOLN
2.0000 mg | INTRAVENOUS | Status: DC | PRN
  Administered 2015-05-30 (×2): 2 mg via INTRAVENOUS
  Filled 2015-05-29: qty 1

## 2015-05-29 MED ORDER — MORPHINE SULFATE (PF) 2 MG/ML IV SOLN
1.0000 mg | INTRAVENOUS | Status: DC | PRN
  Administered 2015-05-29: 2 mg via INTRAVENOUS
  Filled 2015-05-29 (×2): qty 1

## 2015-05-29 MED ORDER — MAGNESIUM SULFATE 4 GM/100ML IV SOLN
4.0000 g | Freq: Once | INTRAVENOUS | Status: AC
  Administered 2015-05-29: 4 g via INTRAVENOUS
  Filled 2015-05-29: qty 100

## 2015-05-29 MED ORDER — BISACODYL 10 MG RE SUPP: 10.0000 mg | Freq: Every day | RECTAL | Status: DC

## 2015-05-29 MED ORDER — 0.9 % SODIUM CHLORIDE (POUR BTL) OPTIME
TOPICAL | Status: DC | PRN
  Administered 2015-05-29: 6000 mL

## 2015-05-29 MED ORDER — MIDAZOLAM HCL 2 MG/2ML IJ SOLN: 2.0000 mg | INTRAMUSCULAR | Status: DC | PRN

## 2015-05-29 MED ORDER — LACTATED RINGERS IV SOLN: INTRAVENOUS | Status: DC

## 2015-05-29 MED ORDER — FENTANYL CITRATE (PF) 250 MCG/5ML IJ SOLN
INTRAMUSCULAR | Status: AC
  Filled 2015-05-29: qty 25

## 2015-05-29 MED ORDER — METOPROLOL TARTRATE 12.5 MG HALF TABLET: 12.5000 mg | ORAL_TABLET | Freq: Once | ORAL | Status: DC

## 2015-05-29 MED ORDER — HEPARIN SODIUM (PORCINE) 1000 UNIT/ML IJ SOLN
INTRAMUSCULAR | Status: DC | PRN
  Administered 2015-05-29: 25000 [IU] via INTRAVENOUS
  Administered 2015-05-29: 3000 [IU] via INTRAVENOUS

## 2015-05-29 MED ORDER — TRAMADOL HCL 50 MG PO TABS
50.0000 mg | ORAL_TABLET | ORAL | Status: DC | PRN
Start: 2015-05-29 — End: 2015-06-04

## 2015-05-29 MED ORDER — METOPROLOL TARTRATE 1 MG/ML IV SOLN: 2.5000 mg | INTRAVENOUS | Status: DC | PRN

## 2015-05-29 MED ORDER — ETOMIDATE 2 MG/ML IV SOLN
INTRAVENOUS | Status: AC
  Filled 2015-05-29: qty 10

## 2015-05-29 MED ORDER — PROTAMINE SULFATE 10 MG/ML IV SOLN
INTRAVENOUS | Status: DC | PRN
  Administered 2015-05-29: 220 mg via INTRAVENOUS

## 2015-05-29 MED ORDER — PROPOFOL 10 MG/ML IV BOLUS
INTRAVENOUS | Status: DC | PRN
  Administered 2015-05-29: 50 mg via INTRAVENOUS

## 2015-05-29 MED ORDER — MIDAZOLAM HCL 5 MG/5ML IJ SOLN
INTRAMUSCULAR | Status: DC | PRN
  Administered 2015-05-29: 4 mg via INTRAVENOUS
  Administered 2015-05-29: 6 mg via INTRAVENOUS

## 2015-05-29 MED ORDER — GLYCOPYRROLATE 0.2 MG/ML IJ SOLN
INTRAMUSCULAR | Status: DC | PRN
  Administered 2015-05-29: 0.1 mg via INTRAVENOUS

## 2015-05-29 MED ORDER — SODIUM CHLORIDE 0.45 % IV SOLN
INTRAVENOUS | Status: DC | PRN
  Administered 2015-05-29: 15:00:00 via INTRAVENOUS

## 2015-05-29 MED ORDER — CALCIUM CHLORIDE 10 % IV SOLN
1.0000 g | Freq: Once | INTRAVENOUS | Status: AC
  Administered 2015-05-29: 1 g via INTRAVENOUS

## 2015-05-29 MED ORDER — ROCURONIUM BROMIDE 100 MG/10ML IV SOLN
INTRAVENOUS | Status: DC | PRN
  Administered 2015-05-29: 50 mg via INTRAVENOUS

## 2015-05-29 MED ORDER — NITROGLYCERIN IN D5W 200-5 MCG/ML-% IV SOLN: 0.0000 ug/min | INTRAVENOUS | Status: DC

## 2015-05-29 MED ORDER — GLYCOPYRROLATE 0.2 MG/ML IJ SOLN
INTRAMUSCULAR | Status: AC
  Filled 2015-05-29: qty 1

## 2015-05-29 MED ORDER — FAMOTIDINE IN NACL 20-0.9 MG/50ML-% IV SOLN
20.0000 mg | Freq: Two times a day (BID) | INTRAVENOUS | Status: AC
  Administered 2015-05-29 (×2): 20 mg via INTRAVENOUS
  Filled 2015-05-29: qty 50

## 2015-05-29 MED ORDER — LIDOCAINE HCL (CARDIAC) 20 MG/ML IV SOLN
INTRAVENOUS | Status: DC | PRN
  Administered 2015-05-29: 100 mg via INTRAVENOUS

## 2015-05-29 MED ORDER — ACETAMINOPHEN 500 MG PO TABS
1000.0000 mg | ORAL_TABLET | Freq: Four times a day (QID) | ORAL | Status: AC
  Administered 2015-05-30 – 2015-06-03 (×18): 1000 mg via ORAL
  Filled 2015-05-29 (×17): qty 2

## 2015-05-29 MED ORDER — CHLORHEXIDINE GLUCONATE 4 % EX LIQD: 30.0000 mL | CUTANEOUS | Status: DC

## 2015-05-29 MED ORDER — VECURONIUM BROMIDE 10 MG IV SOLR
INTRAVENOUS | Status: DC | PRN
  Administered 2015-05-29 (×3): 5 mg via INTRAVENOUS

## 2015-05-29 MED ORDER — LACTATED RINGERS IV SOLN
500.0000 mL | Freq: Once | INTRAVENOUS | Status: DC | PRN
Start: 2015-05-29 — End: 2015-05-30

## 2015-05-29 MED ORDER — SODIUM CHLORIDE 0.9 % IV SOLN
INTRAVENOUS | Status: DC | PRN
  Administered 2015-05-29 (×2): via INTRAVENOUS

## 2015-05-29 MED ORDER — BISACODYL 5 MG PO TBEC
10.0000 mg | DELAYED_RELEASE_TABLET | Freq: Every day | ORAL | Status: DC
  Administered 2015-05-30 – 2015-05-31 (×2): 10 mg via ORAL
  Filled 2015-05-29 (×2): qty 2

## 2015-05-29 MED ORDER — ETOMIDATE 2 MG/ML IV SOLN
INTRAVENOUS | Status: DC | PRN
Start: 2015-05-29 — End: 2015-05-29
  Administered 2015-05-29: 20 mg via INTRAVENOUS

## 2015-05-29 MED ORDER — PHENYLEPHRINE HCL 10 MG/ML IJ SOLN
INTRAMUSCULAR | Status: DC | PRN
  Administered 2015-05-29: 120 ug via INTRAVENOUS

## 2015-05-29 MED ORDER — EPINEPHRINE HCL 0.1 MG/ML IJ SOSY
PREFILLED_SYRINGE | INTRAMUSCULAR | Status: AC
  Filled 2015-05-29: qty 10

## 2015-05-29 MED ORDER — ASPIRIN 81 MG PO CHEW: 324.0000 mg | CHEWABLE_TABLET | Freq: Every day | ORAL | Status: DC

## 2015-05-29 MED ORDER — ALBUMIN HUMAN 5 % IV SOLN
INTRAVENOUS | Status: DC | PRN
  Administered 2015-05-29: 13:00:00 via INTRAVENOUS

## 2015-05-29 MED FILL — Electrolyte-R (PH 7.4) Solution: INTRAVENOUS | Qty: 4000 | Status: AC

## 2015-05-29 MED FILL — Heparin Sodium (Porcine) Inj 1000 Unit/ML: INTRAMUSCULAR | Qty: 10 | Status: AC

## 2015-05-29 MED FILL — Mannitol IV Soln 20%: INTRAVENOUS | Qty: 500 | Status: AC

## 2015-05-29 MED FILL — Lidocaine HCl IV Inj 20 MG/ML: INTRAVENOUS | Qty: 5 | Status: AC

## 2015-05-29 MED FILL — Sodium Chloride IV Soln 0.9%: INTRAVENOUS | Qty: 2000 | Status: AC

## 2015-05-29 MED FILL — Sodium Bicarbonate IV Soln 8.4%: INTRAVENOUS | Qty: 50 | Status: AC

## 2015-05-29 SURGICAL SUPPLY — 150 items
ADAPTER CARDIO PERF ANTE/RETRO (ADAPTER) ×5 IMPLANT
APPLICATOR COTTON TIP 6IN STRL (MISCELLANEOUS) ×5 IMPLANT
APPLIER CLIP 9.375 SM OPEN (CLIP) ×5
ATTRACTOMAT 16X20 MAGNETIC DRP (DRAPES) ×5 IMPLANT
BAG DECANTER FOR FLEXI CONT (MISCELLANEOUS) ×15 IMPLANT
BANDAGE ACE 4X5 VEL STRL LF (GAUZE/BANDAGES/DRESSINGS) ×10 IMPLANT
BANDAGE ACE 6X5 VEL STRL LF (GAUZE/BANDAGES/DRESSINGS) ×10 IMPLANT
BANDAGE ELASTIC 4 VELCRO ST LF (GAUZE/BANDAGES/DRESSINGS) ×5 IMPLANT
BANDAGE ELASTIC 6 VELCRO ST LF (GAUZE/BANDAGES/DRESSINGS) ×5 IMPLANT
BASKET HEART  (ORDER IN 25'S) (MISCELLANEOUS) ×1
BASKET HEART (ORDER IN 25'S) (MISCELLANEOUS) ×1
BASKET HEART (ORDER IN 25S) (MISCELLANEOUS) ×3 IMPLANT
BLADE STERNUM SYSTEM 6 (BLADE) ×10 IMPLANT
BLADE SURG 11 STRL SS (BLADE) ×5 IMPLANT
BLADE SURG ROTATE 9660 (MISCELLANEOUS) IMPLANT
BNDG GAUZE ELAST 4 BULKY (GAUZE/BANDAGES/DRESSINGS) ×10 IMPLANT
CANISTER SUCTION 2500CC (MISCELLANEOUS) ×10 IMPLANT
CANN PRFSN 3/8X14X24FR PCFC (MISCELLANEOUS)
CANN PRFSN 3/8XCNCT ST RT ANG (MISCELLANEOUS)
CANNULA EZ GLIDE AORTIC 21FR (CANNULA) ×10 IMPLANT
CANNULA FEM VENOUS REMOTE 22FR (CANNULA) ×5 IMPLANT
CANNULA GUNDRY RCSP 15FR (MISCELLANEOUS) ×5 IMPLANT
CANNULA PRFSN 3/8X14X24FR PCFC (MISCELLANEOUS) IMPLANT
CANNULA PRFSN 3/8XCNCT RT ANG (MISCELLANEOUS) IMPLANT
CANNULA VEN MTL TIP RT (MISCELLANEOUS)
CANNULA VENNOUS METAL TIP 20FR (CANNULA) ×5 IMPLANT
CATH CPB KIT OWEN (MISCELLANEOUS) ×5 IMPLANT
CATH THORACIC 28FR RT ANG (CATHETERS) IMPLANT
CATH THORACIC 36FR (CATHETERS) ×5 IMPLANT
CLAMP ISOLATOR SYNERGY LG (MISCELLANEOUS) ×10 IMPLANT
CLIP APPLIE 9.375 SM OPEN (CLIP) ×3 IMPLANT
CLIP FOGARTY SPRING 6M (CLIP) IMPLANT
CLIP RETRACTION 3.0MM CORONARY (MISCELLANEOUS) ×5 IMPLANT
CLIP TI MEDIUM 24 (CLIP) IMPLANT
CLIP TI WIDE RED SMALL 24 (CLIP) IMPLANT
CONN 1/2X1/2X1/2  BEN (MISCELLANEOUS) ×2
CONN 1/2X1/2X1/2 BEN (MISCELLANEOUS) ×3 IMPLANT
CONN 3/8X1/2 ST GISH (MISCELLANEOUS) ×10 IMPLANT
CONN ST 1/4X3/8  BEN (MISCELLANEOUS) ×4
CONN ST 1/4X3/8 BEN (MISCELLANEOUS) ×6 IMPLANT
CONNECTOR 1/2X3/8X1/2 3 WAY (MISCELLANEOUS) ×2
CONNECTOR 1/2X3/8X1/2 3WAY (MISCELLANEOUS) ×3 IMPLANT
COVER BACK TABLE 24X17X13 BIG (DRAPES) ×5 IMPLANT
COVER SURGICAL LIGHT HANDLE (MISCELLANEOUS) ×5 IMPLANT
CRADLE DONUT ADULT HEAD (MISCELLANEOUS) ×10 IMPLANT
DERMABOND ADVANCED (GAUZE/BANDAGES/DRESSINGS) ×4
DERMABOND ADVANCED .7 DNX12 (GAUZE/BANDAGES/DRESSINGS) ×6 IMPLANT
DRAIN CHANNEL 32F RND 10.7 FF (WOUND CARE) ×15 IMPLANT
DRAPE CARDIOVASCULAR INCISE (DRAPES) ×2
DRAPE INCISE IOBAN 66X45 STRL (DRAPES) ×5 IMPLANT
DRAPE PROXIMA HALF (DRAPES) ×5 IMPLANT
DRAPE SLUSH/WARMER DISC (DRAPES) ×10 IMPLANT
DRAPE SRG 135X102X78XABS (DRAPES) ×3 IMPLANT
DRSG AQUACEL AG ADV 3.5X14 (GAUZE/BANDAGES/DRESSINGS) ×5 IMPLANT
DRSG COVADERM 4X14 (GAUZE/BANDAGES/DRESSINGS) ×5 IMPLANT
ELECT BLADE 4.0 EZ CLEAN MEGAD (MISCELLANEOUS) ×5
ELECT REM PT RETURN 9FT ADLT (ELECTROSURGICAL) ×20
ELECTRODE BLDE 4.0 EZ CLN MEGD (MISCELLANEOUS) ×3 IMPLANT
ELECTRODE REM PT RTRN 9FT ADLT (ELECTROSURGICAL) ×12 IMPLANT
GAUZE SPONGE 4X4 12PLY STRL (GAUZE/BANDAGES/DRESSINGS) ×15 IMPLANT
GLOVE BIO SURGEON STRL SZ 6 (GLOVE) IMPLANT
GLOVE BIO SURGEON STRL SZ 6.5 (GLOVE) IMPLANT
GLOVE BIO SURGEON STRL SZ7 (GLOVE) IMPLANT
GLOVE BIO SURGEON STRL SZ7.5 (GLOVE) IMPLANT
GLOVE BIO SURGEONS STRL SZ 6.5 (GLOVE)
GLOVE ORTHO TXT STRL SZ7.5 (GLOVE) ×10 IMPLANT
GOWN STRL REUS W/ TWL LRG LVL3 (GOWN DISPOSABLE) ×24 IMPLANT
GOWN STRL REUS W/TWL LRG LVL3 (GOWN DISPOSABLE) ×16
HEMOSTAT POWDER SURGIFOAM 1G (HEMOSTASIS) ×30 IMPLANT
INSERT FOGARTY XLG (MISCELLANEOUS) ×10 IMPLANT
KIT BASIN OR (CUSTOM PROCEDURE TRAY) ×10 IMPLANT
KIT DILATOR VASC 18G NDL (KITS) ×5 IMPLANT
KIT DRAINAGE VACCUM ASSIST (KITS) ×5 IMPLANT
KIT ROOM TURNOVER OR (KITS) ×10 IMPLANT
KIT SUCTION CATH 14FR (SUCTIONS) ×30 IMPLANT
KIT VASOVIEW W/TROCAR VH 2000 (KITS) ×5 IMPLANT
LEAD PACING MYOCARDI (MISCELLANEOUS) ×5 IMPLANT
LINE VENT (MISCELLANEOUS) ×5 IMPLANT
LOOP VESSEL SUPERMAXI WHITE (MISCELLANEOUS) ×10 IMPLANT
MARKER GRAFT CORONARY BYPASS (MISCELLANEOUS) ×15 IMPLANT
NS IRRIG 1000ML POUR BTL (IV SOLUTION) ×50 IMPLANT
PACK OPEN HEART (CUSTOM PROCEDURE TRAY) ×10 IMPLANT
PAD ARMBOARD 7.5X6 YLW CONV (MISCELLANEOUS) ×20 IMPLANT
PAD ELECT DEFIB RADIOL ZOLL (MISCELLANEOUS) ×5 IMPLANT
PENCIL BUTTON HOLSTER BLD 10FT (ELECTRODE) ×5 IMPLANT
PROBE CRYO2-ABLATION MALLABLE (MISCELLANEOUS) IMPLANT
PUNCH AORTIC ROT 4.0MM RCL 40 (MISCELLANEOUS) ×5 IMPLANT
PUNCH AORTIC ROTATE 4.0MM (MISCELLANEOUS) IMPLANT
PUNCH AORTIC ROTATE 4.5MM 8IN (MISCELLANEOUS) IMPLANT
PUNCH AORTIC ROTATE 5MM 8IN (MISCELLANEOUS) IMPLANT
SET CARDIOPLEGIA MPS 5001102 (MISCELLANEOUS) ×5 IMPLANT
SET IRRIG TUBING LAPAROSCOPIC (IRRIGATION / IRRIGATOR) ×5 IMPLANT
SOLUTION ANTI FOG 6CC (MISCELLANEOUS) ×5 IMPLANT
SPONGE LAP 18X18 X RAY DECT (DISPOSABLE) ×10 IMPLANT
SPONGE LAP 4X18 X RAY DECT (DISPOSABLE) ×5 IMPLANT
SUCKER INTRACARDIAC WEIGHTED (SUCKER) ×5 IMPLANT
SUT BONE WAX W31G (SUTURE) ×10 IMPLANT
SUT ETHIBOND 2 0 SH (SUTURE) ×12
SUT ETHIBOND 2 0 SH 36X2 (SUTURE) ×18 IMPLANT
SUT ETHIBOND X763 2 0 SH 1 (SUTURE) ×25 IMPLANT
SUT MNCRL AB 3-0 PS2 18 (SUTURE) ×20 IMPLANT
SUT MNCRL AB 4-0 PS2 18 (SUTURE) ×5 IMPLANT
SUT PDS AB 1 CTX 36 (SUTURE) ×20 IMPLANT
SUT PROLENE 2 0 SH DA (SUTURE) IMPLANT
SUT PROLENE 3 0 SH 1 (SUTURE) ×5 IMPLANT
SUT PROLENE 3 0 SH DA (SUTURE) ×30 IMPLANT
SUT PROLENE 3 0 SH1 36 (SUTURE) ×25 IMPLANT
SUT PROLENE 4 0 RB 1 (SUTURE) ×10
SUT PROLENE 4 0 SH DA (SUTURE) ×10 IMPLANT
SUT PROLENE 4-0 RB1 .5 CRCL 36 (SUTURE) ×15 IMPLANT
SUT PROLENE 5 0 C 1 36 (SUTURE) ×10 IMPLANT
SUT PROLENE 6 0 C 1 30 (SUTURE) ×25 IMPLANT
SUT PROLENE 7.0 RB 3 (SUTURE) ×20 IMPLANT
SUT PROLENE 8 0 BV175 6 (SUTURE) ×15 IMPLANT
SUT PROLENE BLUE 7 0 (SUTURE) ×10 IMPLANT
SUT PROLENE POLY MONO (SUTURE) IMPLANT
SUT SILK  1 MH (SUTURE) ×12
SUT SILK 1 MH (SUTURE) ×18 IMPLANT
SUT SILK 1 TIES 10X30 (SUTURE) ×10 IMPLANT
SUT SILK 2 0 SH CR/8 (SUTURE) ×10 IMPLANT
SUT SILK 2 0 TIES 10X30 (SUTURE) ×5 IMPLANT
SUT SILK 2 0 TIES 17X18 (SUTURE) ×2
SUT SILK 2-0 18XBRD TIE BLK (SUTURE) ×3 IMPLANT
SUT SILK 3 0 SH CR/8 (SUTURE) ×5 IMPLANT
SUT SILK 4 0 TIE 10X30 (SUTURE) ×10 IMPLANT
SUT STEEL 6MS V (SUTURE) IMPLANT
SUT STEEL STERNAL CCS#1 18IN (SUTURE) IMPLANT
SUT STEEL SZ 6 DBL 3X14 BALL (SUTURE) IMPLANT
SUT TEM PAC WIRE 2 0 SH (SUTURE) ×15 IMPLANT
SUT VIC AB 1 CTX 36 (SUTURE)
SUT VIC AB 1 CTX36XBRD ANBCTR (SUTURE) IMPLANT
SUT VIC AB 2-0 CT1 27 (SUTURE) ×2
SUT VIC AB 2-0 CT1 TAPERPNT 27 (SUTURE) ×3 IMPLANT
SUT VIC AB 2-0 CTX 27 (SUTURE) ×10 IMPLANT
SUT VIC AB 3-0 SH 27 (SUTURE)
SUT VIC AB 3-0 SH 27X BRD (SUTURE) IMPLANT
SUT VIC AB 3-0 SH 8-18 (SUTURE) ×5 IMPLANT
SUT VIC AB 3-0 X1 27 (SUTURE) ×10 IMPLANT
SUT VICRYL 4-0 PS2 18IN ABS (SUTURE) IMPLANT
SUTURE E-PAK OPEN HEART (SUTURE) ×5 IMPLANT
SYS ARTICLIP LAA EXCLUSION 145 (Clip) ×5 IMPLANT
SYS ATRICLIP LAA EXCLUSION 45 (CLIP) ×5 IMPLANT
SYSTEM SAHARA CHEST DRAIN ATS (WOUND CARE) ×10 IMPLANT
TOWEL OR 17X24 6PK STRL BLUE (TOWEL DISPOSABLE) ×20 IMPLANT
TOWEL OR 17X26 10 PK STRL BLUE (TOWEL DISPOSABLE) ×20 IMPLANT
TRAY FOLEY IC TEMP SENS 14FR (CATHETERS) ×5 IMPLANT
TRAY FOLEY IC TEMP SENS 16FR (CATHETERS) ×5 IMPLANT
TUBING INSUFFLATION (TUBING) ×5 IMPLANT
UNDERPAD 30X30 INCONTINENT (UNDERPADS AND DIAPERS) ×10 IMPLANT
WATER STERILE IRR 1000ML POUR (IV SOLUTION) ×20 IMPLANT

## 2015-05-29 NOTE — Op Note (Signed)
CARDIOTHORACIC SURGERY OPERATIVE NOTE  Date of Procedure: 05/29/2015  Preoperative Diagnosis:   Severe 3-vessel Coronary Artery Disease  Chronic Persistent Atrial Fibrillation  Postoperative Diagnosis:   Same  Procedure:    Coronary Artery Bypass Grafting x 3  Left Internal Mammary Artery to Distal Left Anterior Descending Coronary Artery  Saphenous Vein Graft to Distal Right Coronary Artery  Saphenous Vein Graft to Second Obtuse Marginal Branch of Left Circumflex Coronary Artery  Endoscopic Vein Harvest from Bilateral Thighs and Right Lower Leg   Maze Procedure  Complete bilateral atrial lesion set using bipolar radiofrequency and cryothermy ablation Clipping of LA appendage (Atriclip, size 43mm)  Surgeon: Valentina Gu. 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 The patient has no previous history of coronary artery disease but his cardiac history dates back at least 6 or 8 years ago when he was diagnosed with atrial fibrillation. The patient was reportedly evaluated by Dr. Dannielle Burn although no records from that evaluation are currently available. The patient was not on long-term anticoagulation therapy. In 2010 the patient suffered a saddle pulmonary embolism. He was treated for 6 months using Xarelto as part of the [place] trial under the care of Dr.  Joya Gaskins. Once anticoagulation therapy was stopped a hypercoagulation panel documented the presence of lupus anticoagulant positive. With the past year or so the patient has been treated by Dr. Trudie Reed because of symptoms of intermittent diffuse muscle aches and stiffness that has been attributed to likely polymyalgia rheumatica. The patient has been off and on prednisone intermittently. According to the patient and his wife, over the past 6 months or so the patient has suffered from fairly profound exertional fatigue. Early last spring and into the summer the patient experienced several traumatic experiences and personal losses, and his symptoms were initially attributed to possible depression. Following urging by the patient's wife the patient was referred for cardiology consultation and initially evaluated by Dr. Martinique on 04/14/2015. The patient was noted to be in persistent atrial fibrillation and started on Xarelto. Transthoracic echocardiogram was performed demonstrating normal left ventricular systolic function with ejection fraction estimated 65-70%. There were no significant valvular abnormalities and left atrial size was normal. A nuclear stress test was performed and found to be intermediate risk study for possible reversible ischemia with a small defect of moderate severity in the mid inferior and apical inferior wall as well as a medium-sized defect in the mid anteroseptal wall. The patient subsequently underwent diagnostic cardiac catheterization by Dr. Martinique and was found to have severe three-vessel coronary artery disease with preserved left ventricular systolic function. The patient was referred for cardiothoracic surgical consultation.  The patient has been seen in consultation and counseled at length regarding the indications, risks and potential benefits of surgery.  All questions have been answered, and the patient provides full informed consent for the operation as  described.     DETAILS OF THE OPERATIVE PROCEDURE  Preparation:  The patient is brought to  the operating room on the above mentioned date and central monitoring was established by the anesthesia team including placement of Swan-Ganz catheter and radial arterial line. The patient is placed in the supine position on the operating table.  Intravenous antibiotics are administered. General endotracheal anesthesia is induced uneventfully. A Foley catheter is placed.  Baseline transesophageal echocardiogram was performed.  Findings were notable for normal LV systolic function.  There was mild LV hypertrophy, mild mitral regurgitation and trace aortic insufficiency.  The patient's chest, abdomen, both groins, and both lower extremities are prepared and draped in a sterile manner. A time out procedure is performed.   Surgical Approach and Conduit Harvest:  A median sternotomy incision was performed and the left internal mammary artery is dissected from the chest wall and prepared for bypass grafting. The left internal mammary artery is notably good quality conduit. Simultaneously, saphenous vein is obtained from the patient's right thigh and lower leg using endoscopic vein harvest technique. The saphenous vein is notably only fair quality conduit and parts are not usable.  Subsequently another segment of greater saphenous vein is obtained from the patient's left thigh using endoscopic harvest technique.  The vein from the left thigh is better quality.  After removal of the saphenous vein, the small surgical incisions in the lower extremity are closed with absorbable suture. Following systemic heparinization, the left internal mammary artery was transected distally noted to have excellent flow.   Extracorporeal Cardiopulmonary Bypass and Myocardial Protection:  The pericardium is opened. The ascending aorta is mildly dilated and elongated in appearance, but it's diameter is < 3.5 cm. The right common  femoral vein is cannulated using the Seldinger technique and a guidewire advanced into the right atrium using TEE guidance.  The patient is heparinized systemically and the femoral vein cannulated using a 22 Fr long femoral venous cannula.  The ascending aorta is cannulated for cardiopulmonary bypass.  Adequate heparinization is verified.   A retrograde cardioplegia cannula is placed through the right atrium into the coronary sinus.  The operative field is continuously flooded with carbon dioxide gas.   The entire pre-bypass portion of the operation was notable for stable hemodynamics.  Cardiopulmonary bypass was begun and the surface of the heart is inspected.  Distal target vessels are inspected for coronary artery bypass.  A second venous cannula is placed directly into the superior vena cava.   A cardioplegia cannula is placed in the ascending aorta.  A temperature probe was placed in the interventricular septum.  The patient is cooled to 32C systemic temperature.  The aortic cross clamp is applied and cold blood cardioplegia is delivered initially in an antegrade fashion through the aortic root.   Supplemental cardioplegia is given retrograde through the coronary sinus catheter.  Iced saline slush is applied for topical hypothermia.  The initial cardioplegic arrest is rapid with early diastolic arrest.  Repeat doses of cardioplegia are administered intermittently throughout the entire cross clamp portion of the operation through the aortic root, down subsequently placed vein grafts, and through the coronary sinus catheter in order to maintain completely flat electrocardiogram and septal myocardial temperature below 15C.  Myocardial protection was felt to be excellent.   Coronary Artery Bypass Grafting:   The distal right coronary artery was grafted using a reversed saphenous vein graft in an end-to-side fashion.  At the site of distal anastomosis the target vessel was fair quality and measured  approximately 2.0 mm in diameter.  The second obtuse marginal branch of the left  circumflex coronary artery was grafted using a reversed saphenous vein graft in an end-to-side fashion.  At the site of distal anastomosis the target vessel was poor quality and measured approximately 1.5 mm in diameter.  It was severely diseased proximal to the distal anastamosis  The distal left anterior coronary artery was grafted with the left internal mammary artery in an end-to-side fashion.  At the site of distal anastomosis the target vessel was poor quality and measured approximately 1.3 mm in diameter.  It was chronically occluded proximally.   Maze Procedure:  The Atricure Synergy ablation system (bipolar radiofrequency ablation clamp) is used for all radiofrequency ablation lesions for the maze procedure.  The Atricure CryoICE nitrous oxide cryothermy ablation system is utilized for all cryothermy lesions.  The heart is retracted towards the surgeon's side and the left sided pulmonary veins exposed.  An elliptical ablation lesion is created around the base of the left sided pulmonary veins.  A similar elliptical lesion was created around the base of the left atrial appendage.  The left atrial appendage was obliterated by deploying a 45 mm Atricure left atrial appendage clip (Atriclip) across the appendage.  The heart was replaced into the pericardial sac.  A left atriotomy incision was performed through the interatrial groove and extended partially across the back wall of the left atrium after opening the oblique sinus inferiorly.  The floor of the left atrium and the mitral valve were exposed using a self-retaining retractor.    An ablation lesion was placed around the right sided pulmonary veins using the bipolar clamp with one limb of the clamp along the endocardial surface and one along the epicardial surface posteriorly.  A bipolar ablation lesion was placed across the dome of the left atrium from the  cephalad apex of the atriotomy incision to reach the cephalad apex of the elliptical lesion around the left sided pulmonary veins.  A similar bipolar lesion was placed across the back wall of the left atrium from the caudad apex of the atriotomy incision to reach the caudad apex of the elliptical lesion around the left sided pulmonary veins, thereby completing a box.  Finally another bipolar lesion was placed across the back wall of the left atrium from the caudad apex of the atriotomy incision towards the posterior mitral valve annulus.  This lesion was completed along the endocardial surface onto the posterior mitral annulus with a 3 minute duration cryothermy lesion, followed by a second cryothermy lesion along the posterior epicardial surface of the left atrium to the coronary sinus.  This completes the entire left side lesion set of the Cox maze procedure.  The left atriotomy incision is closed using a 2 layer closure of running 3-0 Prolene suture after placing a sump drain across the mitral valve to serve as a left ventricular vent.  All proximal vein graft anastomoses were placed directly to the ascending aorta prior to removal of the aortic cross clamp.  The septal myocardial temperature rose rapidly after reperfusion of the left internal mammary artery graft.  One final dose of warm retrograde "hot shot" cardioplegia was administered retrograde through the coronary sinus catheter while all air was evacuated through the aortic root.  The aortic cross clamp was removed after a total cross clamp time of 127 minutes.  The heart began to be spontaneously without any for cardioversion. The retrograde cardioplegia cannula is removed and the small hole in the right atrium is extended for several centimeters towards the lateral wall of the right  atrium. The right side lesion set of the Cox maze procedure is performed using the bipolar radiofrequency ablation clamp to create longitudinal lesion extending from the  posterior apex of the right atriotomy incision along the lateral wall the right atrium to reach the lateral wall of the superior vena cava. A second lesion is placed in the opposite direction to reach the lateral wall of the inferior vena cava. A lesion is then placed extending away from the right atriotomy incision along the lateral wall of the right atrium to reach the apex of the right atrial appendage. One final lesion is performed using cryothermy along the endocardial surface of the right atrium extending from the anterior apex of the right atriotomy incision at the acute margin to reach the tricuspid annulus at the 2 o'clock position. The right atriotomy incision is closed using a 2 layer closure of running 4-0 Prolene suture.   Procedure Completion:  All proximal and distal coronary anastomoses were inspected for hemostasis and appropriate graft orientation. Epicardial pacing wires are fixed to the right ventricular outflow tract and to the right atrial appendage. The patient is rewarmed to 37C temperature. The left ventricular vent is removed. The superior vena cava cannula was removed.  The patient is weaned and disconnected from cardiopulmonary bypass.  The patient's rhythm at separation from bypass was AV paced.  The patient was weaned from cardioplegic bypass without any inotropic support. Total cardiopulmonary bypass time for the operation was 166 minutes.  Followup transesophageal echocardiogram performed after separation from bypass revealed no changes from the preoperative exam.  The aortic cannula was removed uneventfully. Protamine was administered to reverse the anticoagulation. The femoral venous cannula is removed and manual pressure held on the groin for 30 minutes.  The mediastinum and pleural space were inspected for hemostasis and irrigated with saline solution. The mediastinum and the left pleural space were drained using 3 chest tubes placed through separate stab incisions  inferiorly.  The soft tissues anterior to the aorta were reapproximated loosely. The sternum is closed with double strength sternal wire. The soft tissues anterior to the sternum were closed in multiple layers and the skin is closed with a running subcuticular skin closure.  The post-bypass portion of the operation was notable for stable rhythm and hemodynamics.  No blood products were administered during the operation.   Disposition:  The patient tolerated the procedure well and is transported to the surgical intensive care in stable condition. There are no intraoperative complications. All sponge instrument and needle counts are verified correct at completion of the operation.    Valentina Gu. Roxy Manns MD 05/29/2015 2:26 PM

## 2015-05-29 NOTE — Brief Op Note (Addendum)
05/29/2015  12:19 PM  PATIENT:  Estil Daft  65 y.o. male  PRE-OPERATIVE DIAGNOSIS:  CAD  POST-OPERATIVE DIAGNOSIS:  CAD  PROCEDURE:  Procedure(s):  CORONARY ARTERY BYPASS GRAFTING x 3 -LIMA to LAD -SVG to OM2 -SVG to DISTAL RCA  ENDOSCOPIC HARVEST GREATER SAPHENOUS VEIN -Right Leg -Left Thigh  MAZE (N/A) -Complete Bi-Atrial Lesion set with Ablation and Cryothermy  CLIPPING OF LA APPENDAGE -45 AtriClip PRO 145  TRANSESOPHAGEAL ECHOCARDIOGRAM (TEE) (N/A)  SURGEON:    Rexene Alberts, MD  ASSISTANTS:  Ellwood Handler, PA-C  ANESTHESIA:   Catalina Gravel, MD  CROSSCLAMP TIME:   127'  CARDIOPULMONARY BYPASS TIME: 166'  FINDINGS:  Normal LV systolic function  Good quality LIMA conduit for grafting  Fair quality SVG conduit for grafting  Fair to poor target vessels for grafting  Maze Procedure  Surgical Approach: Median sternotomy  Cut-and-sew:  No.  Cryo: Yes  Cryo Lesions (select all that apply):       6  Mitral Valve Cryo Lesion,     10  Tricuspid Cryo Lesion, 16  Other - epicardial posterior AV groove and coronary sinus  Radiofrequency:  Yes.  Bipolar: Yes.  RF Lesions (select all that apply):      1   Pulmonary Vein Isolation,    2   Box Lesion,   3a  Inferior Pulmonary Vein Connecting Lesion,   3b  Superior Pulmonary Vein Connecting Lesion,     4  Posterior Mirtal Annular Line,    11  Intercaval Line,   15a  RAA Lateral Wall (Short) and   15b  RAA Lateral Wall to "T" Lesion    Left Atrial Appendage Treatment:    Yes -  epicardial clip    COMPLICATIONS: None  BASELINE WEIGHT: 87 kg  PATIENT DISPOSITION:   TO SICU IN STABLE CONDITION  Rexene Alberts, MD 05/29/2015 2:22 PM

## 2015-05-29 NOTE — Transfer of Care (Signed)
Immediate Anesthesia Transfer of Care Note  Patient: Peter Bruce  Procedure(s) Performed: Procedure(s) with comments: CORONARY ARTERY BYPASS GRAFTING (CABG), ON PUMP, TIMES THREE, USING LEFT INTERNAL MAMMARY ARTERY, BILATERAL GREATER SAPHENOUS VEINS HARVESTED ENDOSCOPICALLY (N/A) MAZE (N/A) - Complete Bi-Atrial Lesion set with Ablation and Cryothermy TRANSESOPHAGEAL ECHOCARDIOGRAM (TEE) (N/A) CLIPPING OF LEFT ATRIAL APPENDAGE - 45 AtriClip PRO 145  Patient Location: ICU  Anesthesia Type:General  Level of Consciousness: sedated and Patient remains intubated per anesthesia plan  Airway & Oxygen Therapy: Patient remains intubated per anesthesia plan and Patient placed on Ventilator (see vital sign flow sheet for setting)  Post-op Assessment: Report given to RN and Post -op Vital signs reviewed and stable  Post vital signs: Reviewed and stable  Last Vitals:  Filed Vitals:   05/29/15 0612  BP: 151/97  Pulse: 56  Temp: 36 C  Resp: 18    Complications: No apparent anesthesia complications

## 2015-05-29 NOTE — Anesthesia Procedure Notes (Addendum)
Central Venous Catheter Insertion Performed by: anesthesiologist Patient location: Pre-op. Preanesthetic checklist: patient identified, IV checked, site marked, risks and benefits discussed, surgical consent, monitors and equipment checked, pre-op evaluation, timeout performed and anesthesia consent Position: Trendelenburg Lidocaine 1% used for infiltration Landmarks identified Catheter size: 8.5 Fr Central line was placed.Sheath introducer Procedure performed using ultrasound guided technique. Attempts: 1 Following insertion, line sutured and dressing applied. Post procedure assessment: blood return through all ports, free fluid flow and no air. Patient tolerated the procedure well with no immediate complications.    Central Venous Catheter Insertion Performed by: anesthesiologist Patient location: Pre-op. Preanesthetic checklist: patient identified, IV checked, site marked, risks and benefits discussed, surgical consent, monitors and equipment checked, pre-op evaluation, timeout performed and anesthesia consent Landmarks identified PA cath was placed.Swan type and PA catheter depth:thermodilation and 52PA Cath depth:52 Procedure performed using ultrasound guided technique. Attempts: 1 Patient tolerated the procedure well with no immediate complications.    Procedure Name: Intubation Date/Time: 05/29/2015 7:49 AM Performed by: [place] Pink Pre-anesthesia Checklist: Patient identified, Emergency Drugs available, Suction available, Patient being monitored and Timeout performed Patient Re-evaluated:Patient Re-evaluated prior to inductionOxygen Delivery Method: Circle system utilized Preoxygenation: Pre-oxygenation with 100% oxygen Intubation Type: IV induction Ventilation: Two handed mask ventilation required and Oral airway inserted - appropriate to patient size Laryngoscope Size: Glidescope Grade View: Grade III Tube type: Oral Tube size: 8.0 mm Number of attempts: 2 (1st  by SRNA, 2nd by MDA ) Airway Equipment and Method: Stylet Placement Confirmation: ETT inserted through vocal cords under direct vision,  breath sounds checked- equal and bilateral and positive ETCO2 Tube secured with: Tape Dental Injury: Teeth and Oropharynx as per pre-operative assessment  Difficulty Due To: Difficulty was unanticipated Comments: Recommend having glidescope in room.

## 2015-05-29 NOTE — Progress Notes (Signed)
  Echocardiogram Echocardiogram Transesophageal has been performed.  Bobbye Charleston 05/29/2015, 8:40 AM

## 2015-05-29 NOTE — Progress Notes (Signed)
Night shift RRT came, pt no longer shivering and temp decreasing, so no concerns. Flipped to 40/4 at 18:00.   Will continue to monitor.  Henreitta Leber, RN 8:13 PM 05/29/2015

## 2015-05-29 NOTE — Progress Notes (Signed)
Pt arrived at 15:06 from OR to 2S03. Moves all extremities on command as of 15:45. Dr. Ricard Dillon rounded on him, no immediate concerns.   Family just visited (wife and two sisters), given info on visitation policy.  Henreitta Leber, RN 4:10 PM 05/29/2015

## 2015-05-29 NOTE — Anesthesia Postprocedure Evaluation (Signed)
Anesthesia Post Note  Patient: Peter Bruce  Procedure(s) Performed: Procedure(s) (LRB): CORONARY ARTERY BYPASS GRAFTING (CABG), ON PUMP, TIMES THREE, USING LEFT INTERNAL MAMMARY ARTERY, BILATERAL GREATER SAPHENOUS VEINS HARVESTED ENDOSCOPICALLY (N/A) MAZE (N/A) TRANSESOPHAGEAL ECHOCARDIOGRAM (TEE) (N/A) CLIPPING OF LEFT ATRIAL APPENDAGE  Patient location during evaluation: SICU Anesthesia Type: General Level of consciousness: sedated and patient remains intubated per anesthesia plan Pain management: pain level controlled Vital Signs Assessment: post-procedure vital signs reviewed and stable Respiratory status: patient remains intubated per anesthesia plan and patient on ventilator - see flowsheet for VS Cardiovascular status: stable Anesthetic complications: no Comments: MAZE plus CABG    Last Vitals:  Filed Vitals:   05/29/15 1530 05/29/15 1545  BP:    Pulse:    Temp: 37 C 37.1 C  Resp: 19 20    Last Pain: There were no vitals filed for this visit.               Catalina Gravel

## 2015-05-29 NOTE — Progress Notes (Signed)
RT NOTE:  Cardiac wean stopped. Through both stages of wean the patient has not been able to keep his eyes open. Pt has had many apneic periods throughout wean. RT & RN have coached patient through wean and the patient remains very sleepy and have apneic periods. RT will attempt again.

## 2015-05-29 NOTE — OR Nursing (Signed)
On hold for [facility]. Dr. Roxy Manns aware. Charge nurse for SICU will call when bed is available.

## 2015-05-29 NOTE — Progress Notes (Signed)
CRITICAL VALUE ALERT  Critical value received:  Ionized Calcium 0.49 (via i-STAT)  Date of notification:  05/29/2015  Time of notification:  22:00  Critical value read back:No. (i-STAT run by myself)  Nurse who received alert:  (self) Olga Millers. Jashayla Glatfelter, RN  MD notified (1st page):  Dr. Roxy Manns with [facility]  Time of first page:  22:08  MD notified (2nd page): n/a  Time of second page: n/a  Responding MD:  Dr. Roxy Manns  Time MD responded:  22:09  MD notified of critical value as well as pertinent pt signs/symptoms (pt r/o numbness and tingling in hands, rigid muscles, hypotensive requiring Neo). MD ordered 1 amp of calcium chloride via telephone with readback. Calcium given, no adverse reactions. Will continue to monitor.  Henreitta Leber, RN 11:15 PM 05/29/2015

## 2015-05-29 NOTE — Progress Notes (Signed)
RT NOTE:  Cardiac Rapid Wean started.

## 2015-05-29 NOTE — Interval H&P Note (Signed)
History and Physical Interval Note:  05/29/2015 7:18 AM  Peter Bruce  has presented today for surgery, with the diagnosis of CAD  The various methods of treatment have been discussed with the patient and family. After consideration of risks, benefits and other options for treatment, the patient has consented to  Procedure(s): CORONARY ARTERY BYPASS GRAFTING (CABG) (N/A) MAZE (N/A) TRANSESOPHAGEAL ECHOCARDIOGRAM (TEE) (N/A) as a surgical intervention .  The patient's history has been reviewed, patient examined, no change in status, stable for surgery.  I have reviewed the patient's chart and labs.  Questions were answered to the patient's satisfaction.     Rexene Alberts

## 2015-05-29 NOTE — Progress Notes (Signed)
[facility] BRIEF SICU PROGRESS NOTE  Day of Surgery  S/P Procedure(s) (LRB): CORONARY ARTERY BYPASS GRAFTING (CABG), ON PUMP, TIMES THREE, USING LEFT INTERNAL MAMMARY ARTERY, BILATERAL GREATER SAPHENOUS VEINS HARVESTED ENDOSCOPICALLY (N/A) MAZE (N/A) TRANSESOPHAGEAL ECHOCARDIOGRAM (TEE) (N/A) CLIPPING OF LEFT ATRIAL APPENDAGE   Starting to wake on vent AV paced w/ stable hemodynamics O2 sats 100% on 50% FiO2 Chest tube output low UOP adequate Labs okay  Plan: Continue routine early postop  Rexene Alberts, MD 05/29/2015 6:18 PM

## 2015-05-29 NOTE — Progress Notes (Signed)
RT notified of desire to attempt wean again. Eleonore Chiquito RN 2 [place]

## 2015-05-29 NOTE — Progress Notes (Signed)
RT NOTE:  Cardiac Wean started.

## 2015-05-29 NOTE — OR Nursing (Signed)
First call to SICU charge nurse at 1330.

## 2015-05-29 NOTE — OR Nursing (Signed)
Twenty minute call to SICU charge nurse at 1414.

## 2015-05-29 NOTE — Progress Notes (Signed)
Pt shivering noticeably at 18:30 d/t fever. RRT not comfortable weaning while pt shivering d/t risk for acidosis.   Pt's temp currently 38.5 degrees centigrade. Applied ice packs and decreased ambient temperature of room at 19:00.  Restarted pt's Precedex to 0.1 mcg to help with shivering.  Will continue to monitor.  Henreitta Leber, RN 7:45 PM 05/29/2015

## 2015-05-29 NOTE — Progress Notes (Signed)
Flipped to CPAP/PS at 18:30, failed weaning attempt d/t drowsiness (needed cues to breathe every couple of minutes).   RRT flipped back to 50/5, will try again in an hour.  Henreitta Leber, RN 8:51 PM 05/29/2015

## 2015-05-29 NOTE — Progress Notes (Signed)
RT note- Vt decrease post ABG results.

## 2015-05-29 NOTE — Progress Notes (Signed)
RRT arrived, flipped vent to 40/4. A-line positional, RRT now troubleshooting it.  Henreitta Leber, RN 11:28 PM 05/29/2015

## 2015-05-30 ENCOUNTER — Inpatient Hospital Stay (HOSPITAL_COMMUNITY): Payer: Medicare Other

## 2015-05-30 ENCOUNTER — Encounter (HOSPITAL_COMMUNITY): Payer: Self-pay | Admitting: Thoracic Surgery (Cardiothoracic Vascular Surgery)

## 2015-05-30 LAB — POCT I-STAT 3, ART BLOOD GAS (G3+)
ACID-BASE DEFICIT: 2 mmol/L (ref 0.0–2.0)
ACID-BASE DEFICIT: 2 mmol/L (ref 0.0–2.0)
Acid-base deficit: 2 mmol/L (ref 0.0–2.0)
BICARBONATE: 22.2 meq/L (ref 20.0–24.0)
BICARBONATE: 22 meq/L (ref 20.0–24.0)
Bicarbonate: 22.2 mEq/L (ref 20.0–24.0)
O2 SAT: 94 %
O2 Saturation: 95 %
O2 Saturation: 95 %
PCO2 ART: 38.5 mmHg (ref 35.0–45.0)
PO2 ART: 81 mmHg (ref 80.0–100.0)
Patient temperature: 38
TCO2: 23 mmol/L (ref 0–100)
TCO2: 23 mmol/L (ref 0–100)
TCO2: 23 mmol/L (ref 0–100)
pCO2 arterial: 36.5 mmHg (ref 35.0–45.0)
pCO2 arterial: 37.1 mmHg (ref 35.0–45.0)
pH, Arterial: 7.373 (ref 7.350–7.450)
pH, Arterial: 7.395 (ref 7.350–7.450)
pH, Arterial: 7.384 (ref 7.350–7.450)
pO2, Arterial: 80 mmHg (ref 80.0–100.0)
pO2, Arterial: 72 mmHg — ABNORMAL LOW (ref 80.0–100.0)

## 2015-05-30 LAB — POCT I-STAT, CHEM 8
BUN: 16 mg/dL (ref 6–20)
BUN: 24 mg/dL — AB (ref 6–20)
CALCIUM ION: 1.18 mmol/L (ref 1.13–1.30)
CHLORIDE: 105 mmol/L (ref 101–111)
CHLORIDE: 105 mmol/L (ref 101–111)
CREATININE: 0.9 mg/dL (ref 0.61–1.24)
Calcium, Ion: 0.49 mmol/L — CL (ref 1.13–1.30)
Creatinine, Ser: 1.2 mg/dL (ref 0.61–1.24)
GLUCOSE: 137 mg/dL — AB (ref 65–99)
Glucose, Bld: 111 mg/dL — ABNORMAL HIGH (ref 65–99)
HCT: 37 % — ABNORMAL LOW (ref 39.0–52.0)
HEMATOCRIT: 34 % — AB (ref 39.0–52.0)
Hemoglobin: 11.6 g/dL — ABNORMAL LOW (ref 13.0–17.0)
Hemoglobin: 12.6 g/dL — ABNORMAL LOW (ref 13.0–17.0)
POTASSIUM: 4.1 mmol/L (ref 3.5–5.1)
Potassium: 4.1 mmol/L (ref 3.5–5.1)
SODIUM: 143 mmol/L (ref 135–145)
SODIUM: 142 mmol/L (ref 135–145)
TCO2: 21 mmol/L (ref 0–100)
TCO2: 28 mmol/L (ref 0–100)

## 2015-05-30 LAB — GLUCOSE, CAPILLARY
GLUCOSE-CAPILLARY: 100 mg/dL — AB (ref 65–99)
GLUCOSE-CAPILLARY: 119 mg/dL — AB (ref 65–99)
GLUCOSE-CAPILLARY: 157 mg/dL — AB (ref 65–99)
Glucose-Capillary: 119 mg/dL — ABNORMAL HIGH (ref 65–99)
Glucose-Capillary: 128 mg/dL — ABNORMAL HIGH (ref 65–99)
Glucose-Capillary: 140 mg/dL — ABNORMAL HIGH (ref 65–99)
Glucose-Capillary: 150 mg/dL — ABNORMAL HIGH (ref 65–99)

## 2015-05-30 LAB — CBC
HCT: 31.3 % — ABNORMAL LOW (ref 39.0–52.0)
HEMATOCRIT: 36.4 % — AB (ref 39.0–52.0)
Hemoglobin: 10.7 g/dL — ABNORMAL LOW (ref 13.0–17.0)
Hemoglobin: 12.4 g/dL — ABNORMAL LOW (ref 13.0–17.0)
MCH: 31.9 pg (ref 26.0–34.0)
MCH: 32.4 pg (ref 26.0–34.0)
MCHC: 34.2 g/dL (ref 30.0–36.0)
MCHC: 34.1 g/dL (ref 30.0–36.0)
MCV: 93.4 fL (ref 78.0–100.0)
MCV: 95 fL (ref 78.0–100.0)
PLATELETS: 83 10*3/uL — AB (ref 150–400)
Platelets: 80 10*3/uL — ABNORMAL LOW (ref 150–400)
RBC: 3.35 MIL/uL — ABNORMAL LOW (ref 4.22–5.81)
RBC: 3.83 MIL/uL — ABNORMAL LOW (ref 4.22–5.81)
RDW: 13.1 % (ref 11.5–15.5)
RDW: 13.3 % (ref 11.5–15.5)
WBC: 11.9 10*3/uL — ABNORMAL HIGH (ref 4.0–10.5)
WBC: 18.4 10*3/uL — AB (ref 4.0–10.5)

## 2015-05-30 LAB — BASIC METABOLIC PANEL
Anion gap: 8 (ref 5–15)
BUN: 15 mg/dL (ref 6–20)
CHLORIDE: 107 mmol/L (ref 101–111)
CO2: 22 mmol/L (ref 22–32)
CREATININE: 1.16 mg/dL (ref 0.61–1.24)
Calcium: 7.5 mg/dL — ABNORMAL LOW (ref 8.9–10.3)
GFR calc non Af Amer: >60 mL/min (ref >60)
Glucose, Bld: 274 mg/dL — ABNORMAL HIGH (ref 65–99)
POTASSIUM: 4.1 mmol/L (ref 3.5–5.1)
SODIUM: 137 mmol/L (ref 135–145)

## 2015-05-30 LAB — CREATININE, SERUM
Creatinine, Ser: 1.47 mg/dL — ABNORMAL HIGH (ref 0.61–1.24)
GFR calc non Af Amer: 48 mL/min — ABNORMAL LOW (ref >60)
GFR, EST AFRICAN AMERICAN: 56 mL/min — AB (ref >60)

## 2015-05-30 LAB — MAGNESIUM
MAGNESIUM: 2 mg/dL (ref 1.7–2.4)
MAGNESIUM: 2.3 mg/dL (ref 1.7–2.4)

## 2015-05-30 MED ORDER — ENOXAPARIN SODIUM 30 MG/0.3ML ~~LOC~~ SOLN
30.0000 mg | SUBCUTANEOUS | Status: DC
  Filled 2015-05-30: qty 0.3

## 2015-05-30 MED ORDER — MORPHINE SULFATE (PF) 2 MG/ML IV SOLN
2.0000 mg | INTRAVENOUS | Status: DC | PRN
  Administered 2015-05-30 – 2015-05-31 (×6): 2 mg via INTRAVENOUS
  Filled 2015-05-30 (×7): qty 1

## 2015-05-30 MED ORDER — ROSUVASTATIN CALCIUM 40 MG PO TABS
20.0000 mg | ORAL_TABLET | Freq: Every day | ORAL | Status: DC
  Administered 2015-05-31 – 2015-06-03 (×4): 20 mg via ORAL
  Filled 2015-05-30 (×4): qty 1

## 2015-05-30 MED ORDER — FUROSEMIDE 10 MG/ML IJ SOLN
20.0000 mg | Freq: Four times a day (QID) | INTRAMUSCULAR | Status: AC
  Administered 2015-05-30 (×3): 20 mg via INTRAVENOUS
  Filled 2015-05-30 (×3): qty 2

## 2015-05-30 MED FILL — Magnesium Sulfate Inj 50%: INTRAMUSCULAR | Qty: 10 | Status: AC

## 2015-05-30 MED FILL — Heparin Sodium (Porcine) Inj 1000 Unit/ML: INTRAMUSCULAR | Qty: 30 | Status: AC

## 2015-05-30 MED FILL — Potassium Chloride Inj 2 mEq/ML: INTRAVENOUS | Qty: 40 | Status: AC

## 2015-05-30 NOTE — Progress Notes (Signed)
RT NOTE:  Rapid wean started

## 2015-05-30 NOTE — Progress Notes (Signed)
      [address]       [place] [address]             [phone]      PM Rounds  Resting comfortably  BP 125/82 mmHg  Pulse 79  Temp(Src) 98.9 F (37.2 C) (Oral)  Resp 17  Wt 211 lb 13.8 oz (96.1 kg)  SpO2 97%   Intake/Output Summary (Last 24 hours) at 05/30/15 2222 Last data filed at 05/30/15 2200  Gross per 24 hour  Intake 1922.09 ml  Output   2715 ml  Net -792.91 ml   K= 4.1, creatinine= 1.2 Hct= 37  Continue present care  Cylan Borum C. Roxan Hockey, MD [facility] [phone]

## 2015-05-30 NOTE — Discharge Summary (Signed)
Physician Discharge Summary       [address]       [place] [address]             [phone]    Patient ID: Peter Bruce MRN: [id number] DOB/AGE: 1951/01/02 65 y.o.  Admit date: 05/29/2015 Discharge date: 06/04/2015  Admission Diagnoses: 1. Coronary artery disease 2. Chronic, persistent atrial fibrillation  Active Diagnoses:  1.Essential hypertension 2. Atrial fibrillation, controlled ([place]) 3.HTN (hypertension) 4. Acute pulmonary embolus (H[facility]) 5. PMR (polymyalgia rheumatica) (HCC) 6. Neuromuscular disorder ([place]) 7. Arthritis  Procedure (s): Coronary Artery Bypass Grafting x 3 Left Internal Mammary Artery to Distal Left Anterior Descending Coronary Artery Saphenous Vein Graft to Distal Right Coronary Artery Saphenous Vein Graft to Second Obtuse Marginal Branch of Left Circumflex Coronary Artery Endoscopic Vein Harvest from Bilateral Thighs and Right Lower Leg   Maze Procedure Complete bilateral atrial lesion set using bipolar radiofrequency and cryothermy ablation Clipping of LA appendage (Atriclip, size 65mm) by Dr. Roxy Manns on 05/29/2015.  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 The patient has no previous history of coronary artery disease but his cardiac history dates back at least 6 or 8 years ago when he was diagnosed with atrial fibrillation. The patient was reportedly evaluated by Dr. Dannielle Burn although no records from that evaluation are currently available. The patient was not on long-term anticoagulation therapy. In 2010 the patient suffered a saddle pulmonary embolism. He was  treated for 6 months using Xarelto as part of the [place] trial under the care of Dr. Joya Gaskins. Once anticoagulation therapy was stopped a hypercoagulation panel documented the presence of lupus anticoagulant positive. With the past year or so the patient has been treated by Dr. Trudie Reed because of symptoms of intermittent diffuse muscle aches and stiffness that has been attributed to likely polymyalgia rheumatica. The patient has been off and on prednisone intermittently. According to the patient and his wife, over the past 6 months or so the patient has suffered from fairly profound exertional fatigue. Early last spring and into the summer the patient experienced several traumatic experiences and personal losses, and his symptoms were initially attributed to possible depression. Following urging by the patient's wife the patient was referred for cardiology consultation and initially evaluated by Dr. Martinique on 04/14/2015. The patient was noted to be in persistent atrial fibrillation and started on Xarelto. Transthoracic echocardiogram was performed demonstrating normal left ventricular systolic function with ejection fraction estimated 65-70%. There were no significant valvular abnormalities and left atrial size was normal. A nuclear stress test was performed and found to be intermediate risk study for possible reversible ischemia with a small defect of moderate severity in the mid inferior and apical inferior wall as well as a medium-sized defect in the mid anteroseptal wall. The patient subsequently underwent diagnostic cardiac catheterization by Dr. Martinique and was found to have severe three-vessel coronary artery disease with preserved left ventricular systolic function. The patient was referred for cardiothoracic surgical consultation.  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 The patient has been  retired for several years but works every day helping his wife with the kennel and the 3 acres of property they live on. He denies any history of symptoms of exertional shortness of breath or chest discomfort. His primary complaint is that of progressive fatigue and decreased exercise tolerance over the past 6 or 7 months. He has never had any chest pain or chest tightness either with activity or rest. He still exercises fairly regularly, and he is limited primarily by fatigue. He has never had any PND, orthopnea, or lower extremity edema. He has occasional palpitations without dizzy spells or syncope.  Dr. Roxy Manns discussed the need for coronary artery bypass grafting surgery and MAZE. Alternative treatment strategies have been discussed, including the relative risks, benefits and long term prognosis associated with medical therapy, percutaneous coronary intervention, and surgical revascularization. The patient understands and accepts all potential associated risks of surgery. Carotid duplex US showed no significant internal carotid artery stenosis. He underwent a CABG x 3 and MAZE on 01/19.  Brief Hospital Course:  The patient was extubated the morning of surgery without difficulty. He was initially AV paced. He remained afebrile and hemodynamically stable. Gordy Councilman, a line, and foley were removed early in the post operative course. Chest tubes remained for a few days and then were removed. Lopressor was started and titrated accordingly. He was volume over loaded and diuresed. He had ABL anemia. He did not require a post op transfusion. His last H and H was 11.3 He was weaned off the insulin drip. The patient's glucose remained well controlled. The patient's HGA1C pre op was 5.5. Once he was tolerating a diet, home diabetic medicines were restarted.  The patient was felt surgically stable for transfer from the ICU to PCTU for  further convalescence on 06/01/2014. He continues to progress with cardiac rehab. He was ambulating on room air. He has been tolerating a diet and has had a bowel movement. Epicardial pacing wires and chest tube sutures will be removed prior to discharge. The patient is felt surgically stable for discharge today.   Latest Vital Signs: Blood pressure 144/71, pulse 67, temperature 98.4 F (36.9 C), temperature source Oral, resp. rate 20, height 5\' 9"  (1.753 m), weight 201 lb 9.6 oz (91.445 kg), SpO2 93 %.  Physical Exam: Rhythm: AAI paced - slow junctional underneath Breath sounds:clear Heart sounds:RRR Incisions:Dressing dry, intact Abdomen:Soft, non-distended, non-tender Extremities:Warm, well-perfused Chest tubes:Decreasing volume thin serosanguinous output, no air leak  Discharge Condition:Stable and discharged to stable  Recent laboratory studies:  Lab Results  Component Value Date   WBC 10.2 06/03/2015   HGB 11.3* 06/03/2015   HCT 34.1* 06/03/2015   MCV 95.3 06/03/2015   PLT 141* 06/03/2015   Lab Results  Component Value Date   NA 138 06/03/2015   K 3.4* 06/03/2015   CL 100* 06/03/2015   CO2 30 06/03/2015   CREATININE 1.09 06/03/2015   GLUCOSE 126* 06/03/2015   Diagnostic Studies:  Dg Chest Port 1 View  05/30/2015  CLINICAL DATA:  Congestion.  Recent CABG. EXAM: PORTABLE CHEST 1 VIEW COMPARISON:  05/27/2015 and 05/29/2015. FINDINGS: 0636 hours. Interval extubation and removal of the nasogastric tube. Mediastinal drain, left chest tube and right IJ Swan-Ganz catheter remain in place, the latter with its tip in the pulmonary outflow tract. The heart size and mediastinal contours are stable status post median sternotomy and CABG. Left basilar atelectasis is unchanged. There is no  pneumothorax, edema or significant pleural effusion. IMPRESSION: Stable left basilar atelectasis  following partial withdrawal of the support system. No evidence of pneumothorax. Electronically Signed   By: Richardean Sale M.D.   On: 05/29/18  Discharge Medications:   Medication List    TAKE these medications        ALPRAZolam 0.25 MG tablet  Commonly known as:  XANAX  Take 1 tablet (0.25 mg total) by mouth 1 day or 1 dose. For anxiety or dysphagia     amiodarone 200 MG tablet  Commonly known as:  PACERONE  Take 1 tablet (200 mg total) by mouth daily.     amLODipine 10 MG tablet  Commonly known as:  NORVASC  One and one-half daily     aspirin 325 MG EC tablet  Take 1 tablet (325 mg total) by mouth daily.     diazepam 10 MG tablet  Commonly known as:  VALIUM  Take 1 tablet (10 mg total) by mouth every 6 (six) hours as needed for anxiety.     furosemide 40 MG tablet  Commonly known as:  LASIX  Take 1 tablet (40 mg total) by mouth daily. For 7 Days     oxyCODONE 5 MG immediate release tablet  Commonly known as:  Oxy IR/ROXICODONE  Take 1-2 tablets (5-10 mg total) by mouth every 3 (three) hours as needed for severe pain.     potassium chloride SA 20 MEQ tablet  Commonly known as:  K-DUR,KLOR-CON  Take 1 tablet (20 mEq total) by mouth daily. For 7 Days     predniSONE 1 MG tablet  Commonly known as:  DELTASONE  Take 8 mg by mouth daily.     rivaroxaban 20 MG Tabs tablet  Commonly known as:  XARELTO  Take 1 tablet (20 mg total) by mouth daily with supper.     rosuvastatin 20 MG tablet  Commonly known as:  CRESTOR  Take 1 tablet (20 mg total) by mouth daily.     sildenafil 20 MG tablet  Commonly known as:  REVATIO  Take 5 tablets by mouth daily as needed for ED.     temazepam 30 MG capsule  Commonly known as:  RESTORIL  1 at 7pm to prepare for bed     traMADol 50 MG tablet  Commonly known as:  ULTRAM  Take 1-2 tablets (50-100 mg total) by mouth every 4 (four) hours  as needed for moderate pain.     zolpidem 10 MG tablet  Commonly known as:  AMBIEN  1 at bedtime and 1 when wakes at 3am       The patient has been discharged on:   1.Beta Blocker:  Yes [   ]                              No   [ x  ]                              If No, reason: Junctional rhythm, Bradycardia  2.Ace Inhibitor/ARB: Yes [   ]                                     No  [  x  ]  If No, reason: labile BP  3.Statin:   Yes [x   ]                  No  [   ]                  If No, reason:  4.Ecasa:  Yes  [ x  ]                  No   [   ]                  If No, reason:  Follow Up Appointments: Follow-up Information    Follow up with Rexene Alberts, MD On 06/30/2015.   Specialty:  Cardiothoracic Surgery   Why:  PA/LAT CXR (to be taken at [facility] which is in the same building as Dr. Guy Sandifer office) on 06/30/2015 at 2:15 pm;Appointment time is at 3:00 pm   Contact information:   [place] [address] [phone]       Follow up with Peter Martinique, MD On 06/23/2015.   Specialty:  Cardiology   Why:  Appointment is at 11:45   Contact information:   [address] [place] [address] [phone]       Signed: Cinda Quest 06/04/2015, 8:57 AM

## 2015-05-30 NOTE — Progress Notes (Addendum)
Pt failed wean attempt due to NIF and VC. Multiple attempts made and morphine given for pt c/o pain. Will let pt rest due to the amount of attempts (this is 4th wean attempt) and try again at 0500. Will continue to closely monitor. Eleonore Chiquito RN 2 [place]

## 2015-05-30 NOTE — Procedures (Signed)
Extubation Procedure Note  Patient Details:   Name: Gaylard Hitt DOB: 21-Feb-1951 MRN: [id number]   Airway Documentation:  Pre-Extubation: Pt was successful with Cardiac Rapid Wean. ABG acceptable. NIF: -20, VC: 1L, Cuff leak noted. Pt follows all commands.  Post-Extubation: Pt placed on 2L [place]. Pt able to speak name/location. No Stridor noted. RT will monitor. IS 772mls    Evaluation  O2 sats: stable throughout Complications: No apparent complications Patient did tolerate procedure well. Bilateral Breath Sounds: Clear, Diminished   Yes  Sharen Hint 05/30/2015, 6:38 AM

## 2015-05-30 NOTE — Discharge Instructions (Signed)
Coronary Artery Bypass Grafting, Care After °Refer to this sheet in the next few weeks. These instructions provide you with information on caring for yourself after your procedure. Your health care provider may also give you more specific instructions. Your treatment has been planned according to current medical practices, but problems sometimes occur. Call your health care provider if you have any problems or questions after your procedure. °WHAT TO EXPECT AFTER THE PROCEDURE °Recovery from surgery will be different for everyone. Some people feel well after 3 or 4 weeks, while for others it takes longer. After your procedure, it is typical to have the following: °· Nausea and a lack of appetite.   °· Constipation. °· Weakness and fatigue.   °· Depression or irritability.   °· Pain or discomfort at your incision site. °HOME CARE INSTRUCTIONS °· Take medicines only as directed by your health care provider. Do not stop taking medicines or start any new medicines without first checking with your health care provider. °· Take your pulse as directed by your health care provider. °· Perform deep breathing as directed by your health care provider. If you were given a device called an incentive spirometer, use it to practice deep breathing several times a day. Support your chest with a pillow or your arms when you take deep breaths or cough. °· Keep incision areas clean, dry, and protected. Remove or change any bandages (dressings) only as directed by your health care provider. You may have skin adhesive strips over the incision areas. Do not take the strips off. They will fall off on their own. °· Check incision areas daily for any swelling, redness, or drainage. °· If incisions were made in your legs, do the following: °¨ Avoid crossing your legs.   °¨ Avoid sitting for long periods of time. Change positions every 30 minutes.   °¨ Elevate your legs when you are sitting. °· Wear compression stockings as directed by your  health care provider. These stockings help keep blood clots from forming in your legs. °· Take showers once your health care provider approves. Until then, only take sponge baths. Pat incisions dry. Do not rub incisions with a washcloth or towel. Do not take baths, swim, or use a hot tub until your health care provider approves. °· Eat foods that are high in fiber, such as raw fruits and vegetables, whole grains, beans, and nuts. Meats should be lean cut. Avoid canned, processed, and fried foods. °· Drink enough fluid to keep your urine clear or pale yellow. °· Weigh yourself every day. This helps identify if you are retaining fluid that may make your heart and lungs work harder. °· Rest and limit activity as directed by your health care provider. You may be instructed to: °¨ Stop any activity at once if you have chest pain, shortness of breath, irregular heartbeats, or dizziness. Get help right away if you have any of these symptoms. °¨ Move around frequently for short periods or take short walks as directed by your health care provider. Increase your activities gradually. You may need physical therapy or cardiac rehabilitation to help strengthen your muscles and build your endurance. °¨ Avoid lifting, pushing, or pulling anything heavier than 10 lb (4.5 kg) for at least 6 weeks after surgery. °· Do not drive until your health care provider approves.  °· Ask your health care provider when you may return to work. °· Ask your health care provider when you may resume sexual activity. °· Keep all follow-up visits as directed by your health care   provider. This is important. °SEEK MEDICAL CARE IF: °· You have swelling, redness, increasing pain, or drainage at the site of an incision. °· You have a fever. °· You have swelling in your ankles or legs. °· You have pain in your legs.   °· You gain 2 or more pounds (0.9 kg) a day. °· You are nauseous or vomit. °· You have diarrhea.  °SEEK IMMEDIATE MEDICAL CARE IF: °· You have  chest pain that goes to your jaw or arms. °· You have shortness of breath.   °· You have a fast or irregular heartbeat.   °· You notice a "clicking" in your breastbone (sternum) when you move.   °· You have numbness or weakness in your arms or legs. °· You feel dizzy or light-headed.   °MAKE SURE YOU: °· Understand these instructions. °· Will watch your condition. °· Will get help right away if you are not doing well or get worse. °  °This information is not intended to replace advice given to you by your health care provider. Make sure you discuss any questions you have with your health care provider. °  °Document Released: 11/13/2004 Document Revised: 05/17/2014 Document Reviewed: 10/03/2012 °Elsevier Interactive Patient Education ©2016 [employer] ° °

## 2015-05-30 NOTE — Progress Notes (Signed)
RT NOTE:  RT attempted to reposition ALINE. ALINE was redressed. All attempts were unsuccessful so ALINE was removed.

## 2015-05-30 NOTE — Progress Notes (Signed)
[address]       [place] [address]             [phone]        CARDIOTHORACIC SURGERY PROGRESS NOTE   R1 Day Post-Op Procedure(s) (LRB): CORONARY ARTERY BYPASS GRAFTING (CABG), ON PUMP, TIMES THREE, USING LEFT INTERNAL MAMMARY ARTERY, BILATERAL GREATER SAPHENOUS VEINS HARVESTED ENDOSCOPICALLY (N/A) MAZE (N/A) TRANSESOPHAGEAL ECHOCARDIOGRAM (TEE) (N/A) CLIPPING OF LEFT ATRIAL APPENDAGE  Subjective: Just extubated this morning.  Mild soreness.  Breathing comfortably.  No nausea.  Objective: Vital signs: BP Readings from Last 1 Encounters:  05/30/15 119/71   Pulse Readings from Last 1 Encounters:  05/30/15 80   Resp Readings from Last 1 Encounters:  05/30/15 21   Temp Readings from Last 1 Encounters:  05/30/15 100.2 F (37.9 C)     Hemodynamics: PAP: (17-40)/(8-29) 25/20 mmHg CO:  [2.9 L/min-5.7 L/min] 4.4 L/min CI:  [1.4 L/min/m2-2.8 L/min/m2] 2.2 L/min/m2  Physical Exam:  Rhythm:   AAI paced - slow junctional underneath  Breath sounds: clear  Heart sounds:  RRR  Incisions:  Dressing dry, intact  Abdomen:  Soft, non-distended, non-tender  Extremities:  Warm, well-perfused  Chest tubes:  Decreasing volume thin serosanguinous output, no air leak   Intake/Output from previous day: 01/19 0701 - 01/20 0700 In: 8647.3 [I.V.:6552.3; Blood:705; NG/GT:30; IV Piggyback:1350] Out: 6400 [Urine:3875; Emesis/NG output:60; Blood:1735; Chest Tube:730] Intake/Output this shift: Total I/O In: 70 [I.V.:70] Out: 85 [Urine:55; Chest Tube:30]  Lab Results:  CBC: Recent Labs  05/29/15 2130 05/30/15 0420  WBC 9.9 11.9*  HGB 10.9* 10.7*  HCT 32.6* 31.3*  PLT 71* 80*    BMET:  Recent Labs  05/27/15 0956  05/29/15 1316 05/29/15 1523 05/29/15 2130 05/30/15 0420  NA 141  < > 142 142  --  137  K 4.3  < > 4.1 3.9  --  4.1  CL 108  < > 105  --   --  107  CO2 21*  --   --   --   --  22  GLUCOSE 93  < > 110* 123*  --  274*  BUN 26*  < > 18   --   --  15  CREATININE 1.31*  < > 0.80  --  1.03 1.16  CALCIUM 9.1  --   --   --   --  7.5*  < > = values in this interval not displayed.   PT/INR:   Recent Labs  05/29/15 1515  LABPROT 18.8*  INR 1.57*    CBG (last 3)   Recent Labs  05/29/15 2046 05/30/15 0058 05/30/15 0345  GLUCAP 117* 100* 128*    ABG    Component Value Date/Time   PHART 7.384 05/30/2015 0821   PCO2ART 37.1 05/30/2015 0821   PO2ART 72.0* 05/30/2015 0821   HCO3 22.0 05/30/2015 0821   TCO2 23 05/30/2015 0821   ACIDBASEDEF 2.0 05/30/2015 0821   O2SAT 94.0 05/30/2015 0821    CXR:  PORTABLE CHEST 1 VIEW  COMPARISON: 05/27/2015 and 05/29/2015.  FINDINGS: 0636 hours. Interval extubation and removal of the nasogastric tube. Mediastinal drain, left chest tube and right IJ Swan-Ganz catheter remain in place, the latter with its tip in the pulmonary outflow tract. The heart size and mediastinal contours are stable status post median sternotomy and CABG. Left basilar atelectasis is unchanged. There is no pneumothorax, edema or significant pleural effusion.  IMPRESSION: Stable left basilar atelectasis following partial withdrawal of  the support system. No evidence of pneumothorax.   Electronically Signed  By: Richardean Sale M.D.  On: 05/30/2015 08:21      EKG: Slow junctional escape w/out acute ischemic changes     Assessment/Plan: S/P Procedure(s) (LRB): CORONARY ARTERY BYPASS GRAFTING (CABG), ON PUMP, TIMES THREE, USING LEFT INTERNAL MAMMARY ARTERY, BILATERAL GREATER SAPHENOUS VEINS HARVESTED ENDOSCOPICALLY (N/A) MAZE (N/A) TRANSESOPHAGEAL ECHOCARDIOGRAM (TEE) (N/A) CLIPPING OF LEFT ATRIAL APPENDAGE  Overall stable POD1 Maintaining AAI paced rhythm w/ stable hemodynamics off all drips Slow junctional escape underneath pacer Expected post op acute blood loss anemia, mild, Hgb stable 10.7 Expected post op atelectasis, mild Expected post op volume excess, mild Post op  thrombocytopenia, platelet count stable 80k Lupus anticoagulant positive with h/o PE in remote past Essential hypertension PMR, possible neuromuscular disorder Hyperlipidemia   Continue AAI pacing and hold beta blockers  Mobilize  Diuresis  D/C chest tubes later today or tomorrow depending on output  Start low dose Lovenox for DVT prophylaxis tomorrow  Restart Xarelto prior to hospital discharge  Consider PT consult depending on how he does w/ mobilization  Keep in SICU today  Rexene Alberts, MD 05/30/2015 8:35 AM

## 2015-05-30 NOTE — Progress Notes (Signed)
While pt on CPAP during wean pt had several periods of apena followed by RR of 40-45 bpm. RN and RT in room attempting to fix postional arterial line to no avail. Arterial line pulled by RT and pt not felt to meet criteria for wean at this time. Vent switched back to full support. Will attempt again at [place]. Will continue to closely monitor. Eleonore Chiquito Rn [place]

## 2015-05-30 NOTE — Progress Notes (Signed)
RT NOTE:  Rapid Wean started. This will be our 3rd attempt tonight to wean Peter Bruce. During earlier attempts the pt has several periods of apnea, once we speak to Peter Bruce, his RR increases between 35-45. Both RN & RT were at bedside talking to keep patient awake. RT will attempt 3rd time and recommend resting patient until 430-5 with retry.

## 2015-05-30 NOTE — Progress Notes (Signed)
RT NOTE:  Pt placed back to full support. This was the 3rd attempt to wean Peter Bruce. He did have a few apneic episodes during wean but did well. RR did remain about 25RR when he was fully compliant. ABG looked good. Pt is alert and follows most commands. The patient looks very tired at this point. He agrees he is in some pain and tired. He attempted NIF & VC multiple times and did not show understanding with this parameters. Lennette Bihari, RN did remain at bedside to view results. VC was only 600 after 3 attempts, NIF was attempted by patient 5 times and each attempt was unsuccessful as patient did not seem to understand how it inhale through the ETT, only using mouth to pucker lips around ETT. I expressed my concerns with RN and told him patient has been attempting wean since 2000 last night and has expressed his pain and being tired. RN wants to attempt again at 0500.

## 2015-05-31 ENCOUNTER — Inpatient Hospital Stay (HOSPITAL_COMMUNITY): Payer: Medicare Other

## 2015-05-31 ENCOUNTER — Encounter: Payer: Self-pay | Admitting: Thoracic Surgery (Cardiothoracic Vascular Surgery)

## 2015-05-31 LAB — BASIC METABOLIC PANEL
Anion gap: 8 (ref 5–15)
BUN: 23 mg/dL — AB (ref 6–20)
CO2: 28 mmol/L (ref 22–32)
CREATININE: 1.27 mg/dL — AB (ref 0.61–1.24)
Calcium: 8.4 mg/dL — ABNORMAL LOW (ref 8.9–10.3)
Chloride: 105 mmol/L (ref 101–111)
GFR calc Af Amer: >60 mL/min (ref >60)
GFR, EST NON AFRICAN AMERICAN: 57 mL/min — AB (ref >60)
GLUCOSE: 137 mg/dL — AB (ref 65–99)
Potassium: 4 mmol/L (ref 3.5–5.1)
SODIUM: 141 mmol/L (ref 135–145)

## 2015-05-31 LAB — GLUCOSE, CAPILLARY
GLUCOSE-CAPILLARY: 106 mg/dL — AB (ref 65–99)
Glucose-Capillary: 130 mg/dL — ABNORMAL HIGH (ref 65–99)
Glucose-Capillary: 116 mg/dL — ABNORMAL HIGH (ref 65–99)
Glucose-Capillary: 115 mg/dL — ABNORMAL HIGH (ref 65–99)

## 2015-05-31 LAB — CBC
HCT: 33.9 % — ABNORMAL LOW (ref 39.0–52.0)
Hemoglobin: 11.6 g/dL — ABNORMAL LOW (ref 13.0–17.0)
MCH: 32.6 pg (ref 26.0–34.0)
MCHC: 34.2 g/dL (ref 30.0–36.0)
MCV: 95.2 fL (ref 78.0–100.0)
PLATELETS: 66 10*3/uL — AB (ref 150–400)
RBC: 3.56 MIL/uL — ABNORMAL LOW (ref 4.22–5.81)
RDW: 13.2 % (ref 11.5–15.5)
WBC: 17.3 10*3/uL — AB (ref 4.0–10.5)

## 2015-05-31 MED ORDER — CETYLPYRIDINIUM CHLORIDE 0.05 % MT LIQD
7.0000 mL | Freq: Two times a day (BID) | OROMUCOSAL | Status: DC
  Administered 2015-05-31 – 2015-06-01 (×2): 7 mL via OROMUCOSAL

## 2015-05-31 MED ORDER — DIAZEPAM 5 MG PO TABS: 10.0000 mg | ORAL_TABLET | Freq: Four times a day (QID) | ORAL | Status: DC | PRN

## 2015-05-31 MED ORDER — DIAZEPAM 5 MG PO TABS
10.0000 mg | ORAL_TABLET | Freq: Four times a day (QID) | ORAL | Status: DC | PRN
  Administered 2015-05-31: 10 mg via ORAL
  Filled 2015-05-31: qty 2

## 2015-05-31 MED ORDER — ZOLPIDEM TARTRATE 5 MG PO TABS
5.0000 mg | ORAL_TABLET | Freq: Every day | ORAL | Status: DC
  Administered 2015-05-31 – 2015-06-03 (×4): 5 mg via ORAL
  Filled 2015-05-31 (×4): qty 1

## 2015-05-31 MED ORDER — POTASSIUM CHLORIDE CRYS ER 20 MEQ PO TBCR
20.0000 meq | EXTENDED_RELEASE_TABLET | Freq: Every day | ORAL | Status: DC
  Administered 2015-05-31 – 2015-06-03 (×4): 20 meq via ORAL
  Filled 2015-05-31 (×4): qty 1

## 2015-05-31 MED ORDER — PREDNISONE 5 MG PO TABS
8.0000 mg | ORAL_TABLET | Freq: Every day | ORAL | Status: DC
  Administered 2015-05-31 – 2015-06-01 (×2): 8 mg via ORAL
  Filled 2015-05-31 (×3): qty 3

## 2015-05-31 MED ORDER — FUROSEMIDE 40 MG PO TABS
40.0000 mg | ORAL_TABLET | Freq: Every day | ORAL | Status: DC
  Administered 2015-05-31 – 2015-06-02 (×3): 40 mg via ORAL
  Filled 2015-05-31 (×4): qty 1

## 2015-05-31 MED ORDER — INSULIN ASPART 100 UNIT/ML ~~LOC~~ SOLN: 0.0000 [IU] | Freq: Three times a day (TID) | SUBCUTANEOUS | Status: DC

## 2015-05-31 NOTE — Progress Notes (Signed)
      [name]       ,[place] [address]             [phone]      C/o severe GI discomfort after medication, but is not sure which one  BP 149/92 mmHg  Pulse 70  Temp(Src) 98.9 F (37.2 C) (Oral)  Resp 16  Wt 199 lb 8.3 oz (90.5 kg)  SpO2 93%   Intake/Output Summary (Last 24 hours) at 05/31/15 1712 Last data filed at 05/31/15 1500  Gross per 24 hour  Intake    900 ml  Output   1900 ml  Net  -1000 ml    Will dc colace and dulcolax  Takes ambien nightly- will order  Remo Lipps C. Roxan Hockey, MD [facility] [phone]

## 2015-05-31 NOTE — Progress Notes (Addendum)
2 Days Post-Op Procedure(s) (LRB): CORONARY ARTERY BYPASS GRAFTING (CABG), ON PUMP, TIMES THREE, USING LEFT INTERNAL MAMMARY ARTERY, BILATERAL GREATER SAPHENOUS VEINS HARVESTED ENDOSCOPICALLY (N/A) MAZE (N/A) TRANSESOPHAGEAL ECHOCARDIOGRAM (TEE) (N/A) CLIPPING OF LEFT ATRIAL APPENDAGE Subjective: C/o back pain  Objective: Vital signs in last 24 hours: Temp:  [97.9 F (36.6 C)-99.8 F (37.7 C)] 97.9 F (36.6 C) (01/21 0438) Pulse Rate:  [59-81] 59 (01/21 0600) Cardiac Rhythm:  [-] Atrial paced (01/21 0400) Resp:  [12-25] 17 (01/21 0600) BP: (101-136)/(73-97) 109/74 mmHg (01/21 0600) SpO2:  [92 %-100 %] 92 % (01/21 0600) Weight:  [199 lb 8.3 oz (90.5 kg)] 199 lb 8.3 oz (90.5 kg) (01/21 0500)  Hemodynamic parameters for last 24 hours: PAP: (32-41)/(26-35) 41/35 mmHg  Intake/Output from previous day: 01/20 0701 - 01/21 0700 In: 1410 [P.O.:750; I.V.:560; IV Piggyback:100] Out: 2865 [Urine:2305; Chest Tube:560] Intake/Output this shift:    General appearance: alert, cooperative and mild distress Neurologic: intact Heart: regular rate and rhythm Lungs: diminished breath sounds bibasilar Abdomen: normal findings: soft, non-tender  Lab Results:  Recent Labs  05/30/15 1725 05/30/15 1738 05/31/15 0435  WBC 18.4*  --  17.3*  HGB 12.4* 12.6* 11.6*  HCT 36.4* 37.0* 33.9*  PLT 83*  --  66*   BMET:  Recent Labs  05/30/15 0420  05/30/15 1738 05/31/15 0435  NA 137  --  142 141  K 4.1  --  4.1 4.0  CL 107  --  105 105  CO2 22  --   --  28  GLUCOSE 274*  --  137* 137*  BUN 15  --  24* 23*  CREATININE 1.16  < > 1.20 1.27*  CALCIUM 7.5*  --   --  8.4*  < > = values in this interval not displayed.  PT/INR:  Recent Labs  05/29/15 1515  LABPROT 18.8*  INR 1.57*   ABG    Component Value Date/Time   PHART 7.384 05/30/2015 0821   HCO3 22.0 05/30/2015 0821   TCO2 28 05/30/2015 1738   ACIDBASEDEF 2.0 05/30/2015 0821   O2SAT 94.0 05/30/2015 0821   CBG (last 3)    Recent Labs  05/30/15 1941 05/30/15 2351 05/31/15 0433  GLUCAP 119* 119* 130*    Assessment/Plan: S/P Procedure(s) (LRB): CORONARY ARTERY BYPASS GRAFTING (CABG), ON PUMP, TIMES THREE, USING LEFT INTERNAL MAMMARY ARTERY, BILATERAL GREATER SAPHENOUS VEINS HARVESTED ENDOSCOPICALLY (N/A) MAZE (N/A) TRANSESOPHAGEAL ECHOCARDIOGRAM (TEE) (N/A) CLIPPING OF LEFT ATRIAL APPENDAGE -  POD # 2  CV- bradycardic, Atrial pacing at 60 bpm  On ASA, will resume xarelto prior to dc  RESP- IS for atelectasis  RENAL- creatinine up slightly, lytes OK  Dc Foley  ENDO- CBG well controlled  Thrombocytopenia- his platelet count decreased from 83 to 66 K. Will hold enoxaparin, continue SCD  Check HIT panel  Anxiety- he is very anxious, was taking valium at home- will reorder   LOS: 2 days    Peter Bruce 05/31/2015

## 2015-05-31 NOTE — Plan of Care (Signed)
Problem: Activity: Goal: Risk for activity intolerance will decrease Outcome: Progressing Pt tolerating OOB to chair BID, able to ambulate 600' POD 1 with repeated encouragement.  Problem: Respiratory: Goal: Ability to tolerate decreased levels of ventilator support will improve Outcome: Completed/Met Date Met:  05/31/15 Delayed post op ventilator weaning d/t repeated periods of apnea. Pt extubated to nasal cannula after 15 hrs POD 1. Able to perform C/DB exercises with promptingh.

## 2015-06-01 ENCOUNTER — Inpatient Hospital Stay (HOSPITAL_COMMUNITY): Payer: Medicare Other

## 2015-06-01 LAB — CBC
HEMATOCRIT: 30.5 % — AB (ref 39.0–52.0)
Hemoglobin: 10.4 g/dL — ABNORMAL LOW (ref 13.0–17.0)
MCH: 32.6 pg (ref 26.0–34.0)
MCHC: 34.1 g/dL (ref 30.0–36.0)
MCV: 95.6 fL (ref 78.0–100.0)
Platelets: 68 10*3/uL — ABNORMAL LOW (ref 150–400)
RBC: 3.19 MIL/uL — AB (ref 4.22–5.81)
RDW: 13.1 % (ref 11.5–15.5)
WBC: 14.8 10*3/uL — AB (ref 4.0–10.5)

## 2015-06-01 LAB — GLUCOSE, CAPILLARY
GLUCOSE-CAPILLARY: 110 mg/dL — AB (ref 65–99)
Glucose-Capillary: 120 mg/dL — ABNORMAL HIGH (ref 65–99)
Glucose-Capillary: 95 mg/dL (ref 65–99)
Glucose-Capillary: 105 mg/dL — ABNORMAL HIGH (ref 65–99)

## 2015-06-01 LAB — BASIC METABOLIC PANEL
ANION GAP: 9 (ref 5–15)
BUN: 25 mg/dL — ABNORMAL HIGH (ref 6–20)
CALCIUM: 8.2 mg/dL — AB (ref 8.9–10.3)
CO2: 30 mmol/L (ref 22–32)
Chloride: 104 mmol/L (ref 101–111)
Creatinine, Ser: 1.13 mg/dL (ref 0.61–1.24)
GLUCOSE: 112 mg/dL — AB (ref 65–99)
POTASSIUM: 4 mmol/L (ref 3.5–5.1)
Sodium: 143 mmol/L (ref 135–145)

## 2015-06-01 LAB — HEPARIN INDUCED PLATELET AB (HIT ANTIBODY): HEPARIN INDUCED PLT AB: 0.592 {OD_unit} — AB (ref 0.000–0.400)

## 2015-06-01 MED ORDER — ASPIRIN EC 81 MG PO TBEC
81.0000 mg | DELAYED_RELEASE_TABLET | Freq: Every day | ORAL | Status: DC
  Administered 2015-06-01: 81 mg via ORAL
  Filled 2015-06-01: qty 1

## 2015-06-01 NOTE — Progress Notes (Signed)
3 Days Post-Op Procedure(s) (LRB): CORONARY ARTERY BYPASS GRAFTING (CABG), ON PUMP, TIMES THREE, USING LEFT INTERNAL MAMMARY ARTERY, BILATERAL GREATER SAPHENOUS VEINS HARVESTED ENDOSCOPICALLY (N/A) MAZE (N/A) TRANSESOPHAGEAL ECHOCARDIOGRAM (TEE) (N/A) CLIPPING OF LEFT ATRIAL APPENDAGE Subjective: Feels a little better today Less abdominal discomfort Pain moderate  Objective: Vital signs in last 24 hours: Temp:  [98.1 F (36.7 C)-98.9 F (37.2 C)] 98.2 F (36.8 C) (01/22 0700) Pulse Rate:  [58-98] 69 (01/22 0700) Cardiac Rhythm:  [-] Atrial paced (01/22 0800) Resp:  [12-26] 18 (01/22 0700) BP: (104-149)/(68-117) 110/80 mmHg (01/22 0700) SpO2:  [84 %-100 %] 95 % (01/22 0700) Weight:  [194 lb 10.7 oz (88.3 kg)] 194 lb 10.7 oz (88.3 kg) (01/22 0600)  Hemodynamic parameters for last 24 hours:    Intake/Output from previous day: 01/21 0701 - 01/22 0700 In: 1620 [P.O.:1200; I.V.:420] Out: 1010 [Urine:1010] Intake/Output this shift: Total I/O In: -  Out: 100 [Urine:100]  General appearance: alert, cooperative and no distress Neurologic: intact Heart: regular rate and rhythm Lungs: diminished breath sounds bibasilar Extremities: no edema;  Lab Results:  Recent Labs  05/31/15 0435 06/01/15 0354  WBC 17.3* 14.8*  HGB 11.6* 10.4*  HCT 33.9* 30.5*  PLT 66* 68*   BMET:  Recent Labs  05/31/15 0435 06/01/15 0354  NA 141 143  K 4.0 4.0  CL 105 104  CO2 28 30  GLUCOSE 137* 112*  BUN 23* 25*  CREATININE 1.27* 1.13  CALCIUM 8.4* 8.2*    PT/INR:  Recent Labs  05/29/15 1515  LABPROT 18.8*  INR 1.57*   ABG    Component Value Date/Time   PHART 7.384 05/30/2015 0821   HCO3 22.0 05/30/2015 0821   TCO2 28 05/30/2015 1738   ACIDBASEDEF 2.0 05/30/2015 0821   O2SAT 94.0 05/30/2015 0821   CBG (last 3)   Recent Labs  05/31/15 1302 05/31/15 1737 05/31/15 2157  GLUCAP 115* 120* 106*    Assessment/Plan: S/P Procedure(s) (LRB): CORONARY ARTERY BYPASS GRAFTING  (CABG), ON PUMP, TIMES THREE, USING LEFT INTERNAL MAMMARY ARTERY, BILATERAL GREATER SAPHENOUS VEINS HARVESTED ENDOSCOPICALLY (N/A) MAZE (N/A) TRANSESOPHAGEAL ECHOCARDIOGRAM (TEE) (N/A) CLIPPING OF LEFT ATRIAL APPENDAGE -CV- atrial paced at 70. 45 junctional under pacer- keep pacer for now  RESP- continue IS  RENAL- creatinine back to normal, lytes OK  Continue PO lasix  Dc Foley  ENDO- CBG well controlled  Thrombocytopenia persists- PLT count stable  On SCD for DVT prophylaxis  No enoxaparin due to thrombocytopenia  Decrease ASA to 81 mg daily  Continue ambulation   LOS: 3 days    Melrose Nakayama 06/01/2015

## 2015-06-01 NOTE — Progress Notes (Signed)
      [address]       [place] [address]             [phone]      PM Rounds  Stable day, no new issues  Still atrial paced  BP 129/77 mmHg  Pulse 70  Temp(Src) 98.4 F (36.9 C) (Oral)  Resp 19  Ht 5\' 9"  (1.753 m)  Wt 194 lb 10.7 oz (88.3 kg)  BMI 28.73 kg/m2  SpO2 97%   Intake/Output Summary (Last 24 hours) at 06/01/15 1937 Last data filed at 06/01/15 1800  Gross per 24 hour  Intake   1260 ml  Output   1125 ml  Net    135 ml    Continue present care  Remo Lipps C. Roxan Hockey, MD [facility] [phone]

## 2015-06-02 ENCOUNTER — Inpatient Hospital Stay (HOSPITAL_COMMUNITY): Payer: Medicare Other

## 2015-06-02 LAB — BASIC METABOLIC PANEL
ANION GAP: 10 (ref 5–15)
BUN: 23 mg/dL — AB (ref 6–20)
CALCIUM: 8.4 mg/dL — AB (ref 8.9–10.3)
CO2: 31 mmol/L (ref 22–32)
CREATININE: 1 mg/dL (ref 0.61–1.24)
Chloride: 100 mmol/L — ABNORMAL LOW (ref 101–111)
GFR calc non Af Amer: >60 mL/min (ref >60)
GLUCOSE: 108 mg/dL — AB (ref 65–99)
POTASSIUM: 4.3 mmol/L (ref 3.5–5.1)
SODIUM: 141 mmol/L (ref 135–145)

## 2015-06-02 LAB — CBC
HCT: 32.5 % — ABNORMAL LOW (ref 39.0–52.0)
Hemoglobin: 10.8 g/dL — ABNORMAL LOW (ref 13.0–17.0)
MCH: 31.9 pg (ref 26.0–34.0)
MCHC: 33.2 g/dL (ref 30.0–36.0)
MCV: 95.9 fL (ref 78.0–100.0)
PLATELETS: 99 10*3/uL — AB (ref 150–400)
RBC: 3.39 MIL/uL — AB (ref 4.22–5.81)
RDW: 13.3 % (ref 11.5–15.5)
WBC: 12.1 10*3/uL — AB (ref 4.0–10.5)

## 2015-06-02 MED ORDER — AMLODIPINE BESYLATE 10 MG PO TABS
10.0000 mg | ORAL_TABLET | Freq: Every day | ORAL | Status: DC
  Administered 2015-06-02 – 2015-06-03 (×2): 10 mg via ORAL
  Filled 2015-06-02 (×2): qty 1

## 2015-06-02 MED ORDER — ALPRAZOLAM 0.25 MG PO TABS
0.2500 mg | ORAL_TABLET | Freq: Two times a day (BID) | ORAL | Status: DC | PRN
  Administered 2015-06-03: 0.25 mg via ORAL
  Filled 2015-06-02: qty 1

## 2015-06-02 MED ORDER — ENOXAPARIN SODIUM 30 MG/0.3ML ~~LOC~~ SOLN: 30.0000 mg | SUBCUTANEOUS | Status: DC

## 2015-06-02 MED ORDER — SODIUM CHLORIDE 0.9 % IJ SOLN: 3.0000 mL | INTRAMUSCULAR | Status: DC | PRN

## 2015-06-02 MED ORDER — MOVING RIGHT ALONG BOOK
Freq: Once | Status: AC
  Administered 2015-06-02: 09:00:00
  Filled 2015-06-02: qty 1

## 2015-06-02 MED ORDER — SODIUM CHLORIDE 0.9 % IV SOLN: 250.0000 mL | INTRAVENOUS | Status: DC | PRN

## 2015-06-02 MED ORDER — SODIUM CHLORIDE 0.9 % IJ SOLN
3.0000 mL | Freq: Two times a day (BID) | INTRAMUSCULAR | Status: DC
  Administered 2015-06-02 – 2015-06-03 (×2): 3 mL via INTRAVENOUS

## 2015-06-02 MED ORDER — ASPIRIN EC 325 MG PO TBEC
325.0000 mg | DELAYED_RELEASE_TABLET | Freq: Every day | ORAL | Status: DC
  Administered 2015-06-02 – 2015-06-03 (×2): 325 mg via ORAL
  Filled 2015-06-02 (×2): qty 1

## 2015-06-02 NOTE — Plan of Care (Signed)
Problem: Activity: Goal: Risk for activity intolerance will decrease Outcome: Completed/Met Date Met:  06/02/15 Pt tolerating progression of activity, ambulating 2-3 time daily 300-600 feet each time. Complying with OOB for meals. On obstacle for activity progression is chronic back pain and myalgia-type pain noted in MD notes.   Problem: Respiratory: Goal: Levels of oxygenation will improve Outcome: Completed/Met Date Met:  06/02/15 Pt progressively decreasing need for supplemental O2 with increase in activity and continued encouragement to perform C/DB exercises regularly. Pt maintaining O2 sats>90 on 2 L [place].

## 2015-06-02 NOTE — Care Management Important Message (Signed)
Important Message  Patient Details  Name: Peter Bruce MRN: [id number] Date of Birth: 1950-08-06   Medicare Important Message Given:  Yes    Monalisa Bayless P [place] 06/02/2015, 2:09 PM

## 2015-06-02 NOTE — Plan of Care (Signed)
Problem: Activity: Goal: Risk for activity intolerance will decrease Outcome: Progressing Ambulating 600 ft x 2 so far today.  Problem: Cardiac: Goal: Hemodynamic stability will improve Outcome: Progressing Junctional 50-60's with occ pvcs or pairs  Problem: Pain Management: Goal: Pain level will decrease Outcome: Progressing Receiving oxycodone 5 mg po x 2 for pain today.

## 2015-06-02 NOTE — Progress Notes (Addendum)
Peter Bruce [address]       [place] [address]             [phone]        CARDIOTHORACIC SURGERY PROGRESS NOTE   R4 Days Post-Op Procedure(s) (LRB): CORONARY ARTERY BYPASS GRAFTING (CABG), ON PUMP, TIMES THREE, USING LEFT INTERNAL MAMMARY ARTERY, BILATERAL GREATER SAPHENOUS VEINS HARVESTED ENDOSCOPICALLY (N/A) MAZE (N/A) TRANSESOPHAGEAL ECHOCARDIOGRAM (TEE) (N/A) CLIPPING OF LEFT ATRIAL APPENDAGE  Subjective: Looks good and reports feeling much better.  Ambulated around ICU twice already this morning  Objective: Vital signs: BP Readings from Last 1 Encounters:  06/02/15 122/79   Pulse Readings from Last 1 Encounters:  06/02/15 70   Resp Readings from Last 1 Encounters:  06/02/15 18   Temp Readings from Last 1 Encounters:  06/02/15 98.1 F (36.7 C) Oral    Hemodynamics:    Physical Exam:  Rhythm:   Junctional w/ HR 50-60, stable  Breath sounds: clear  Heart sounds:  RRR  Incisions:  Clean and dry  Abdomen:  Soft, non-distended, non-tender  Extremities:  Warm, well-perfused    Intake/Output from previous day: 01/22 0701 - 01/23 0700 In: 1680 [P.O.:1680] Out: 1175 [Urine:1175] Intake/Output this shift:    Lab Results:  CBC: Recent Labs  06/01/15 0354 06/02/15 0252  WBC 14.8* 12.1*  HGB 10.4* 10.8*  HCT 30.5* 32.5*  PLT 68* 99*    BMET:  Recent Labs  06/01/15 0354 06/02/15 0252  NA 143 141  K 4.0 4.3  CL 104 100*  CO2 30 31  GLUCOSE 112* 108*  BUN 25* 23*  CREATININE 1.13 1.00  CALCIUM 8.2* 8.4*     PT/INR:  No results for input(s): LABPROT, INR in the last 72 hours.  CBG (last 3)   Recent Labs  06/01/15 0837 06/01/15 1222 06/01/15 1735  GLUCAP 95 105* 110*    ABG    Component Value Date/Time   PHART 7.384 05/30/2015 0821   PCO2ART 37.1 05/30/2015 0821   PO2ART 72.0* 05/30/2015 0821   HCO3 22.0 05/30/2015 0821   TCO2 28 05/30/2015 1738   ACIDBASEDEF 2.0 05/30/2015 0821   O2SAT 94.0 05/30/2015 0821     CXR: PORTABLE CHEST 1 VIEW  COMPARISON: 06/01/2015  FINDINGS: Changes of CABG. Cardiomegaly. Left lower lobe atelectasis with small left pleural effusion. Low lung volumes. No confluent opacity on the right. No pneumothorax.  IMPRESSION: Low lung volumes. Left base atelectasis with small left effusion.   Electronically Signed  By: Rolm Baptise M.D.  On: 06/02/2015 07:18   Assessment/Plan: S/P Procedure(s) (LRB): CORONARY ARTERY BYPASS GRAFTING (CABG), ON PUMP, TIMES THREE, USING LEFT INTERNAL MAMMARY ARTERY, BILATERAL GREATER SAPHENOUS VEINS HARVESTED ENDOSCOPICALLY (N/A) MAZE (N/A) TRANSESOPHAGEAL ECHOCARDIOGRAM (TEE) (N/A) CLIPPING OF LEFT ATRIAL APPENDAGE  Doing well POD4 Maintaining stable junctional rhythm w/ retrograde P waves, HR improved Expected post op acute blood loss anemia, mild, Hgb stable 10.8 Expected post op atelectasis, mild Expected post op volume excess, mild, I/O balanced last 48 hours, weight 2 kg above baseline Post op thrombocytopenia, platelet count improved 99k 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

## 2015-06-03 ENCOUNTER — Encounter: Payer: Self-pay | Admitting: Cardiology

## 2015-06-03 ENCOUNTER — Inpatient Hospital Stay (HOSPITAL_COMMUNITY): Payer: Medicare Other

## 2015-06-03 LAB — CBC
HCT: 34.1 % — ABNORMAL LOW (ref 39.0–52.0)
HEMOGLOBIN: 11.3 g/dL — AB (ref 13.0–17.0)
MCH: 31.6 pg (ref 26.0–34.0)
MCHC: 33.1 g/dL (ref 30.0–36.0)
MCV: 95.3 fL (ref 78.0–100.0)
Platelets: 141 10*3/uL — ABNORMAL LOW (ref 150–400)
RBC: 3.58 MIL/uL — AB (ref 4.22–5.81)
RDW: 13 % (ref 11.5–15.5)
WBC: 10.2 10*3/uL (ref 4.0–10.5)

## 2015-06-03 LAB — BASIC METABOLIC PANEL
Anion gap: 8 (ref 5–15)
BUN: 21 mg/dL — ABNORMAL HIGH (ref 6–20)
CHLORIDE: 100 mmol/L — AB (ref 101–111)
CO2: 30 mmol/L (ref 22–32)
Calcium: 8.6 mg/dL — ABNORMAL LOW (ref 8.9–10.3)
Creatinine, Ser: 1.09 mg/dL (ref 0.61–1.24)
GFR calc non Af Amer: >60 mL/min (ref >60)
Glucose, Bld: 126 mg/dL — ABNORMAL HIGH (ref 65–99)
POTASSIUM: 3.4 mmol/L — AB (ref 3.5–5.1)
SODIUM: 138 mmol/L (ref 135–145)

## 2015-06-03 MED ORDER — POTASSIUM CHLORIDE CRYS ER 20 MEQ PO TBCR
40.0000 meq | EXTENDED_RELEASE_TABLET | Freq: Once | ORAL | Status: AC
  Administered 2015-06-03: 40 meq via ORAL
  Filled 2015-06-03: qty 2

## 2015-06-03 MED ORDER — LABETALOL HCL 5 MG/ML IV SOLN: 10.0000 mg | INTRAVENOUS | Status: DC | PRN

## 2015-06-03 NOTE — Progress Notes (Addendum)
      [address]       [place] [address]             [phone]    5 Days Post-Op Procedure(s) (LRB): CORONARY ARTERY BYPASS GRAFTING (CABG), ON PUMP, TIMES THREE, USING LEFT INTERNAL MAMMARY ARTERY, BILATERAL GREATER SAPHENOUS VEINS HARVESTED ENDOSCOPICALLY (N/A) MAZE (N/A) TRANSESOPHAGEAL ECHOCARDIOGRAM (TEE) (N/A) CLIPPING OF LEFT ATRIAL APPENDAGE   Subjective:  Peter Bruce has no complaints.  He does ask that we don't stick him anymore as his fingertips hurt.  Objective: Vital signs in last 24 hours: Temp:  [98.3 F (36.8 C)-99.1 F (37.3 C)] 98.3 F (36.8 C) (01/24 0314) Pulse Rate:  [55-70] 68 (01/24 0314) Cardiac Rhythm:  [-] Heart block (01/24 0700) Resp:  [11-23] 18 (01/24 0314) BP: (110-150)/(66-92) 150/89 mmHg (01/24 0314) SpO2:  [87 %-99 %] 96 % (01/24 0314) Weight:  [200 lb 14.4 oz (91.128 kg)] 200 lb 14.4 oz (91.128 kg) (01/24 0314)  Intake/Output from previous day: 01/23 0701 - 01/24 0700 In: 880 [P.O.:880] Out: 1175 [Urine:1175]  General appearance: alert, cooperative and no distress Heart: regular rate and rhythm Lungs: clear to auscultation bilaterally Abdomen: soft, non-tender; bowel sounds normal; no masses,  no organomegaly Extremities: edema trace Wound: clean and dry  Lab Results:  Recent Labs  06/02/15 0252 06/03/15 0315  WBC 12.1* 10.2  HGB 10.8* 11.3*  HCT 32.5* 34.1*  PLT 99* 141*   BMET:  Recent Labs  06/02/15 0252 06/03/15 0315  NA 141 138  K 4.3 3.4*  CL 100* 100*  CO2 31 30  GLUCOSE 108* 126*  BUN 23* 21*  CREATININE 1.00 1.09  CALCIUM 8.4* 8.6*    PT/INR: No results for input(s): LABPROT, INR in the last 72 hours. ABG    Component Value Date/Time   PHART 7.384 05/30/2015 0821   HCO3 22.0 05/30/2015 0821   TCO2 28 05/30/2015 1738   ACIDBASEDEF 2.0 05/30/2015 0821   O2SAT 94.0 05/30/2015 0821   CBG (last 3)   Recent Labs  06/01/15 0837 06/01/15 1222 06/01/15 1735  GLUCAP 95 105* 110*     Assessment/Plan: S/P Procedure(s) (LRB): CORONARY ARTERY BYPASS GRAFTING (CABG), ON PUMP, TIMES THREE, USING LEFT INTERNAL MAMMARY ARTERY, BILATERAL GREATER SAPHENOUS VEINS HARVESTED ENDOSCOPICALLY (N/A) MAZE (N/A) TRANSESOPHAGEAL ECHOCARDIOGRAM (TEE) (N/A) CLIPPING OF LEFT ATRIAL APPENDAGE  1. CV- hemodynamically stable, tolerating junctional rhythm- continue to hold Amiodarone and Beta blocker.. Plan is to start Eliquis after wires removed 2. Pulm- CXR pending, off oxygen, continue IS 3. Renal- creatinine stable at 1.09, remains hypervolemic on Lasix 4. CBGs- not a diabetic, will d/c fingerstick 5. Dispo- patient remains stable, will d/c EPW today, start Eliquis this evening, possibly home in AM   LOS: 5 days    BARRETT, ERIN 06/03/2015  I have seen and examined the patient and agree with the assessment and plan as outlined.  Tentatively plan d/c home tomorrow.  Rexene Alberts, MD 06/03/2015 1:03 PM

## 2015-06-03 NOTE — Progress Notes (Signed)
CARDIAC REHAB PHASE I   PRE:  Rate/Rhythm: 82 SR with PVC    BP: sitting 125/61    SaO2: 95 RA ear (was 78 on finger)  MODE:  Ambulation: 440 ft   POST:  Rate/Rhythm: 92 SR with PVC    BP: sitting 141/68     SaO2: 92 RA  Pt sleeping upon arrival. Agreeable to walk but slightly impulsive. Needed verbal cues on how to get out of bed. Pt impulsively stood and lost balance, catching himself on RW (? Leg gave way). Pt acted slightly strange, not answering questions appropriately or listening to instruction. Ambulated with RW and denied c/o but did veer to the right up against the wall and also did not hold his right hand appropriately on the RW. To recliner after walk, sts he feels well. VSS. Gave reminders not to get up without assist. Seemed more aware after walk, answering questions appropriately. Will f/u tomorrow. Will need wife for education. [id number]   Peter Bruce Oretta CES, ACSM 06/03/2015 2:26 PM

## 2015-06-03 NOTE — Care Management Note (Signed)
Case Management Note Marvetta Gibbons RN, BSN Unit 2W-Case Manager [phone]  Patient Details  Name: Peter Bruce MRN: [id number] Date of Birth: 1950/06/02  Subjective/Objective:    Pt admitted s/p CABG x3, tx from ICU on 06/02/15                Action/Plan: PTA pt lived at home- with spouse- anticipate return home- plan to remove EPW today 06/03/15- possible d/c in am  Expected Discharge Date:              Expected Discharge Plan:  Home/Self Care  In-House Referral:     Discharge planning Services  CM Consult  Post Acute Care Choice:    Choice offered to:     DME Arranged:    DME Agency:     HH Arranged:    [place] Agency:     Status of Service:  In process, will continue to follow  Medicare Important Message Given:  Yes Date Medicare IM Given:    Medicare IM give by:    Date Additional Medicare IM Given:    Additional Medicare Important Message give by:     If discussed at [place] of Stay Meetings, dates discussed:    Additional Comments:  Dawayne Patricia, RN 06/03/2015, 10:27 AM

## 2015-06-03 NOTE — Progress Notes (Signed)
Utilization review completed.  

## 2015-06-03 NOTE — Progress Notes (Signed)
EPWs pulled per protocol, no ectopy or other problems noted at this time.  Instructed on need for 1 hr of bedrest with VSS monitoring.  Will continue to monitor.

## 2015-06-04 ENCOUNTER — Encounter: Payer: Self-pay | Admitting: Thoracic Surgery (Cardiothoracic Vascular Surgery)

## 2015-06-04 ENCOUNTER — Telehealth: Payer: Self-pay | Admitting: *Deleted

## 2015-06-04 LAB — SEROTONIN RELEASE ASSAY (SRA): SRA .2 IU/mL UFH Ser-aCnc: <1 % (ref 0–20)

## 2015-06-04 MED ORDER — FUROSEMIDE 40 MG PO TABS: 40.0000 mg | ORAL_TABLET | Freq: Every day | ORAL | Status: DC

## 2015-06-04 MED ORDER — RIVAROXABAN 20 MG PO TABS: 20.0000 mg | ORAL_TABLET | Freq: Every day | ORAL | Status: DC

## 2015-06-04 MED ORDER — POTASSIUM CHLORIDE CRYS ER 20 MEQ PO TBCR: 20.0000 meq | EXTENDED_RELEASE_TABLET | Freq: Every day | ORAL | Status: DC

## 2015-06-04 MED ORDER — OXYCODONE HCL 5 MG PO TABS: 5.0000 mg | ORAL_TABLET | ORAL | Status: DC | PRN

## 2015-06-04 MED ORDER — ASPIRIN 325 MG PO TBEC: 325.0000 mg | DELAYED_RELEASE_TABLET | Freq: Every day | ORAL | Status: DC

## 2015-06-04 MED ORDER — AMIODARONE HCL 200 MG PO TABS: 200.0000 mg | ORAL_TABLET | Freq: Every day | ORAL | Status: DC

## 2015-06-04 MED ORDER — TRAMADOL HCL 50 MG PO TABS: 50.0000 mg | ORAL_TABLET | ORAL | Status: DC | PRN

## 2015-06-04 NOTE — Care Management Note (Signed)
Case Management Note Marvetta Gibbons RN, BSN Unit 2W-Case Manager [phone]  Patient Details  Name: Tarion Arduini MRN: [id number] Date of Birth: [birth year]  Subjective/Objective:    Pt admitted s/p CABG x3, tx from ICU on 06/02/15                Action/Plan: PTA pt lived at home- with spouse- anticipate return home- plan to remove EPW today 06/03/15- possible d/c in am  Expected Discharge Date:  06/04/15            Expected Discharge Plan:  Home/Self Care  In-House Referral:     Discharge planning Services  CM Consult  Post Acute Care Choice:  NA Choice offered to:  NA  DME Arranged:    DME Agency:     HH Arranged:    [place] Agency:     Status of Service:  Completed, signed off  Medicare Important Message Given:  Yes Date Medicare IM Given:    Medicare IM give by:    Date Additional Medicare IM Given:    Additional Medicare Important Message give by:     If discussed at [place] of Stay Meetings, dates discussed:    Discharge Disposition: Home/self care   Additional Comments:  Dawayne Patricia, RN 06/04/2015, 9:51 AM

## 2015-06-04 NOTE — Telephone Encounter (Signed)
Mrs. Polinsky emailed via [place] her concerns of the side effects of Amiodarone and feels it is causing Mr. Sorrells to have instability with walking , some incoherence.  He was discharged today s/p CABG/MAZE.  I discussed her concerns with Dr. Roxy Manns and he said the Amiodarone could be stopped and he would like to see him in the office on Monday, 1/30.  I called and relayed this information to her and she agreed. The appointment has been made.

## 2015-06-04 NOTE — Progress Notes (Addendum)
      [address]       ,[place] [address]             [phone]      6 Days Post-Op Procedure(s) (LRB): CORONARY ARTERY BYPASS GRAFTING (CABG), ON PUMP, TIMES THREE, USING LEFT INTERNAL MAMMARY ARTERY, BILATERAL GREATER SAPHENOUS VEINS HARVESTED ENDOSCOPICALLY (N/A) MAZE (N/A) TRANSESOPHAGEAL ECHOCARDIOGRAM (TEE) (N/A) CLIPPING OF LEFT ATRIAL APPENDAGE   Subjective:  [name] has no complaints.  Per cardiac rehab patient was impulsive yesterday with ambulating.  Instructed patient that he needs to be cautious and not get up and down too quickly and to walk at a slow, steady pace until very stable on his feet.  He is agreeable to this.  Objective: Vital signs in last 24 hours: Temp:  [97.6 F (36.4 C)-98.4 F (36.9 C)] 98.4 F (36.9 C) (01/25 0423) Pulse Rate:  [66-67] 67 (01/25 0423) Cardiac Rhythm:  [-] Sinus bradycardia (01/24 1901) Resp:  [18-20] 20 (01/25 0423) BP: (125-146)/(61-82) 144/71 mmHg (01/25 0423) SpO2:  [93 %-96 %] 93 % (01/25 0423) Weight:  [201 lb 9.6 oz (91.445 kg)] 201 lb 9.6 oz (91.445 kg) (01/25 0200)  Intake/Output from previous day: 01/24 0701 - 01/25 0700 In: 1200 [P.O.:1200] Out: 200 [Urine:200]  General appearance: alert, cooperative and no distress Heart: regular rate and rhythm Lungs: clear to auscultation bilaterally Abdomen: soft, non-tender; bowel sounds normal; no masses,  no organomegaly Extremities: edema trace Wound: clean and dry  Lab Results:  Recent Labs  06/02/15 0252 06/03/15 0315  WBC 12.1* 10.2  HGB 10.8* 11.3*  HCT 32.5* 34.1*  PLT 99* 141*   BMET:  Recent Labs  06/02/15 0252 06/03/15 0315  NA 141 138  K 4.3 3.4*  CL 100* 100*  CO2 31 30  GLUCOSE 108* 126*  BUN 23* 21*  CREATININE 1.00 1.09  CALCIUM 8.4* 8.6*    PT/INR: No results for input(s): LABPROT, INR in the last 72 hours. ABG    Component Value Date/Time   PHART 7.384 05/30/2015 0821   HCO3 22.0 05/30/2015 0821   TCO2 28  05/30/2015 1738   ACIDBASEDEF 2.0 05/30/2015 0821   O2SAT 94.0 05/30/2015 0821   CBG (last 3)   Recent Labs  06/01/15 0837 06/01/15 1222 06/01/15 1735  GLUCAP 95 105* 110*    Assessment/Plan: S/P Procedure(s) (LRB): CORONARY ARTERY BYPASS GRAFTING (CABG), ON PUMP, TIMES THREE, USING LEFT INTERNAL MAMMARY ARTERY, BILATERAL GREATER SAPHENOUS VEINS HARVESTED ENDOSCOPICALLY (N/A) MAZE (N/A) TRANSESOPHAGEAL ECHOCARDIOGRAM (TEE) (N/A) CLIPPING OF LEFT ATRIAL APPENDAGE  1. CV- appears to be in NSR, will continue to hold Amiodarone, Beta Blocker for now 2. Pulm- no acute issues, instructed patient to continue IS at discharge 3. Renal- remains hypervolemic, continue Lasix 4. Dispo- patient stable, appears to be in NSR, wants to go home.. Will discuss with staff   LOS: 6 days    BARRETT, Peter Bruce 06/04/2015  I have seen and examined the patient and agree with the assessment and plan as outlined.  Plan d/c home today.  Resume Xarelto.  Resume amiodarone at reduced dose.  Peter Alberts, MD 06/04/2015 8:41 AM

## 2015-06-04 NOTE — Progress Notes (Signed)
Ed completed with pt and wife. Wife very receptive, pt mostly nodded. Pt very anxious to get home. Will refer to [facility] CRPII.  [id number] Yves Dill CES, ACSM 10:17 AM 06/04/2015

## 2015-06-04 NOTE — Progress Notes (Signed)
Patient discharged home with wife. IV was dc'd and was intact. Sutures were removed and were dry. Medications and discharge instructions were reviewed and the wife and patient stated that they understood. Volunteers wheeled him out.

## 2015-06-04 NOTE — Progress Notes (Signed)
Patient ambulated 300 ft via rolling walker on room air.  Patient tolerated well.  RN will continue to monitor patient

## 2015-06-06 ENCOUNTER — Other Ambulatory Visit: Payer: Self-pay | Admitting: Thoracic Surgery (Cardiothoracic Vascular Surgery)

## 2015-06-06 DIAGNOSIS — Z951 Presence of aortocoronary bypass graft: Secondary | ICD-10-CM

## 2015-06-09 ENCOUNTER — Ambulatory Visit (INDEPENDENT_AMBULATORY_CARE_PROVIDER_SITE_OTHER): Payer: Self-pay | Admitting: Thoracic Surgery (Cardiothoracic Vascular Surgery)

## 2015-06-09 ENCOUNTER — Other Ambulatory Visit: Payer: Self-pay | Admitting: *Deleted

## 2015-06-09 ENCOUNTER — Ambulatory Visit: Payer: Medicare Other | Admitting: Cardiology

## 2015-06-09 ENCOUNTER — Ambulatory Visit
Admission: RE | Admit: 2015-06-09 | Discharge: 2015-06-09 | Disposition: A | Discharge status: Home / Self Care | Payer: Medicare Other | Source: Ambulatory Visit | Attending: Thoracic Surgery (Cardiothoracic Vascular Surgery) | Admitting: Thoracic Surgery (Cardiothoracic Vascular Surgery)

## 2015-06-09 ENCOUNTER — Encounter: Payer: Self-pay | Admitting: Thoracic Surgery (Cardiothoracic Vascular Surgery)

## 2015-06-09 VITALS — BP 150/93 | HR 71 | Resp 16 | Ht 69.0 in | Wt 196.0 lb

## 2015-06-09 DIAGNOSIS — Z951 Presence of aortocoronary bypass graft: Secondary | ICD-10-CM

## 2015-06-09 DIAGNOSIS — I4891 Unspecified atrial fibrillation: Secondary | ICD-10-CM

## 2015-06-09 DIAGNOSIS — Z9889 Other specified postprocedural states: Secondary | ICD-10-CM

## 2015-06-09 DIAGNOSIS — R790 Abnormal level of blood mineral: Secondary | ICD-10-CM

## 2015-06-09 DIAGNOSIS — J90 Pleural effusion, not elsewhere classified: Secondary | ICD-10-CM

## 2015-06-09 DIAGNOSIS — Z8679 Personal history of other diseases of the circulatory system: Secondary | ICD-10-CM

## 2015-06-09 DIAGNOSIS — I2510 Atherosclerotic heart disease of native coronary artery without angina pectoris: Secondary | ICD-10-CM

## 2015-06-09 DIAGNOSIS — R760 Raised antibody titer: Secondary | ICD-10-CM

## 2015-06-09 MED ORDER — METOPROLOL SUCCINATE ER 25 MG PO TB24: 25.0000 mg | ORAL_TABLET | Freq: Every day | ORAL | Status: DC

## 2015-06-09 MED ORDER — ASPIRIN 81 MG PO TBEC: 81.0000 mg | DELAYED_RELEASE_TABLET | Freq: Every day | ORAL | Status: DC

## 2015-06-09 NOTE — Progress Notes (Addendum)
[address]       [place] [address]             [phone]     CARDIOTHORACIC SURGERY OFFICE NOTE  Referring Provider is Martinique, Peter M, MD PCP is DOOLITTLE, Linton Ham, MD   HPI:   Mr Sullinger is a 65 y.o. Male with a history of hypertension, hyperlipidemia, lupus anticoagulant positive with previous pulmonary embolus , polymyalgia rheumatica,  chronic persistent atrial fibrillation and severe multivessel coronary artery disease. He is here today for follow up of CABG and MAZE procedure on 05/29/15. His post operative recovery was uneventful and he was discharged on post of day 6.   Since discharge he has been able to perform his basic activities such as eating, dressing and bathing without difficulty. He does not complain of shortness of breath, chest pain, dizziness, palpitations or fatigue or any other symptoms of atrial fibrillation. The patient has had some mild soreness in his back and shoulders since discharge. In addition, he complains that he has been coughing when eating or drinking, he feels that this has gradually improved over the past several days. We discussed gradually increasing his walking and the importance of not lifting heavy objects or strain due to the sternotomy for the next several months.    Current Outpatient Prescriptions  Medication Sig Dispense Refill  . ALPRAZolam (XANAX) 0.25 MG tablet Take 1 tablet (0.25 mg total) by mouth 1 day or 1 dose. For anxiety or dysphagia (Patient taking differently: Take 0.25 mg by mouth 2 (two) times daily as needed for anxiety. For anxiety or dysphagia) 30 tablet 5  . amLODipine (NORVASC) 10 MG tablet Take 15 mg by mouth daily.    Marland Kitchen aspirin 81 MG EC tablet Take 1 tablet (81 mg total) by mouth daily.    . diazepam (VALIUM) 10 MG tablet Take 1 tablet (10 mg total) by mouth every 6 (six) hours as needed for anxiety. (Patient taking differently: Take 10 mg by mouth every 4 (four) hours as needed for anxiety. )  120 tablet 1  . oxyCODONE (OXY IR/ROXICODONE) 5 MG immediate release tablet Take 1-2 tablets (5-10 mg total) by mouth every 3 (three) hours as needed for severe pain. 30 tablet 0  . rivaroxaban (XARELTO) 20 MG TABS tablet Take 1 tablet (20 mg total) by mouth daily with supper. 30 tablet 11  . rosuvastatin (CRESTOR) 20 MG tablet Take 1 tablet (20 mg total) by mouth daily. 90 tablet 3  . sildenafil (REVATIO) 20 MG tablet Take 5 tablets by mouth daily as needed for ED. (Patient taking differently: 100 mg 3 (three) times daily as needed. Take 5 tablets by mouth daily as needed for ED.) 50 tablet 11  . temazepam (RESTORIL) 30 MG capsule 1 at 7pm to prepare for bed (Patient taking differently: Take 30 mg by mouth every evening. ) 30 capsule 5  . zolpidem (AMBIEN) 10 MG tablet 1 at bedtime and 1 when wakes at 3am (Patient taking differently: Take 10 mg by mouth at bedtime. ) 60 tablet 5  . metoprolol succinate (TOPROL-XL) 25 MG 24 hr tablet Take 1 tablet (25 mg total) by mouth daily. 30 tablet 1  . traMADol (ULTRAM) 50 MG tablet Take 1-2 tablets (50-100 mg total) by mouth every 4 (four) hours as needed for moderate pain. (Patient not taking: Reported on 06/09/2015) 30 tablet 0   No current facility-administered medications for this visit.  Physical Exam:   BP 150/93 mmHg  Pulse 71  Resp 16  Ht 5\' 9"  (1.753 m)  Wt 196 lb (88.905 kg)  BMI 28.93 kg/m2  SpO2 96%  General:  Well appearing, comfortable  Chest:     CV:   Regular rate and rhythm, S1, S2 present, no murmurs , rubs or gallops  Incisions:  Clean, dry, healing well  Abdomen:  Soft, non-tender, non-distended, no masses  Extremities:  No edema appreciated, warm   Diagnostic Tests:  None at this time   Impression:  Patient is doing well with steady improvement. He is in normal sinus rhythm status post CABG x 3 and MAZE procedure.  Plan:  Patient will begin taking 81 mg aspirin, stop taking 325 mg aspirin daily Patient will  start taking 25 mg of metoprolol daily Patient will start cardiac rehab We will see Mr Shimko in the office for routine follow up in 2-3 month or sooner if the patient has concerns    Lawernce Keas, PA-S  06/09/2015 10:55 AM   I have seen and examined the patient and agree with the assessment and plan as outlined.  Rexene Alberts, MD 06/09/2015 11:35 AM

## 2015-06-09 NOTE — Patient Instructions (Signed)
You are encouraged to enroll and participate in the outpatient cardiac rehab program beginning as soon as practical.  Continue to avoid any heavy lifting or strenuous use of your arms or shoulders for at least a total of three months from the time of surgery.  After three months you may gradually increase how much you lift or otherwise use your arms or chest as tolerated, with limits based upon whether or not activities lead to the return of significant discomfort.  Decrease aspirin to 81 mg daily.  Begin taking Toprol XL 25 mg daily.

## 2015-06-11 ENCOUNTER — Encounter: Payer: Self-pay | Admitting: Cardiology

## 2015-06-11 ENCOUNTER — Encounter: Payer: Self-pay | Admitting: *Deleted

## 2015-06-11 NOTE — Progress Notes (Signed)
Patient ID: Peter Bruce, male   DOB: 1950/08/24, 65 y.o.   MRN: [id number] A referral has been faxed to [facility] to contact Peter Bruce to begin Phase II per the request of Dr. Roxy Manns.

## 2015-06-13 ENCOUNTER — Encounter: Payer: Self-pay | Admitting: Internal Medicine

## 2015-06-14 ENCOUNTER — Encounter: Payer: Self-pay | Admitting: Cardiology

## 2015-06-15 ENCOUNTER — Encounter: Payer: Self-pay | Admitting: Cardiology

## 2015-06-16 ENCOUNTER — Encounter: Payer: Self-pay | Admitting: Internal Medicine

## 2015-06-16 DIAGNOSIS — G4700 Insomnia, unspecified: Secondary | ICD-10-CM

## 2015-06-18 MED ORDER — TEMAZEPAM 30 MG PO CAPS: ORAL_CAPSULE | ORAL | Status: DC

## 2015-06-18 NOTE — Telephone Encounter (Signed)
Phone in

## 2015-06-23 ENCOUNTER — Ambulatory Visit: Payer: Medicare Other | Admitting: Cardiology

## 2015-06-23 ENCOUNTER — Ambulatory Visit (INDEPENDENT_AMBULATORY_CARE_PROVIDER_SITE_OTHER): Payer: Medicare Other | Admitting: Cardiology

## 2015-06-23 ENCOUNTER — Encounter: Payer: Self-pay | Admitting: Cardiology

## 2015-06-23 VITALS — BP 138/94 | HR 80 | Ht 68.5 in | Wt 189.4 lb

## 2015-06-23 DIAGNOSIS — I480 Paroxysmal atrial fibrillation: Secondary | ICD-10-CM | POA: Diagnosis not present

## 2015-06-23 DIAGNOSIS — Z8679 Personal history of other diseases of the circulatory system: Secondary | ICD-10-CM

## 2015-06-23 DIAGNOSIS — Z951 Presence of aortocoronary bypass graft: Secondary | ICD-10-CM

## 2015-06-23 DIAGNOSIS — Z9889 Other specified postprocedural states: Secondary | ICD-10-CM | POA: Diagnosis not present

## 2015-06-23 DIAGNOSIS — I2510 Atherosclerotic heart disease of native coronary artery without angina pectoris: Secondary | ICD-10-CM

## 2015-06-23 DIAGNOSIS — E785 Hyperlipidemia, unspecified: Secondary | ICD-10-CM | POA: Diagnosis not present

## 2015-06-23 NOTE — Progress Notes (Signed)
Cardiology Office Note   Date:  06/23/2015   ID:  Peter Bruce, DOB 25-Dec-1950, MRN [id number]  PCP:  [name], MD  Cardiologist:   Timber Marshman Martinique, MD   Chief Complaint  Patient presents with  . Follow-up    no chest pain, no shortness of breath, no edema, no pain or cramping in legs, no lightheadedness or dizziness      History of Present Illness: Peter Bruce is a 65 y.o. male is seen for post hospital follow up for CAD and Afib.  He was seen in December with complaints of worsening fatigue and weakness with exertion. He denied any chest pain or SOB. He was found to be in Afib with controlled rate. He was anticoagulated with Xarelto. An Echo was normal. Myoview study showed multiple perfusion abnormalities. He subsequently underwent cardiac cath on 05/15/15 which demonstrated severe 3 vessel CAD with normal LV function. On 05/29/15 he had surgery by Dr. Roxy Manns with LIMA to the LAD, SVG to the RCA, SVG to the OM. He also had a MAZE procedure and clipping of the atrial appendage. His post op course was uncomplicated. When seen back by Dr. Roxy Manns on 06/03/15 he was still in Afib. He still feels a little weak. Denies palpitations, chest pain, or SOB. Incisions are healing well. He has lost 11 lbs from pre op. No swelling. Ready to begin Cardiac Rehab.     Past Medical History  Diagnosis Date  . Hypertension   . Arthritis   . Neuromuscular disorder ([place])   . PMR (polymyalgia rheumatica) ([facility])   . Acute pulmonary embolus ([place]) 2010  . Hypercholesterolemia   . Atrial fibrillation ([place])   . Coronary artery disease involving native coronary artery 05/15/2015    multivessel  . Lupus anticoagulant positive 07/29/2008  . History of kidney stones   . 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 S/P Maze operation for atrial fibrillation 05/29/2015    Complete bilateral atrial lesion set via median sternotomy using bipolar  radiofrequency and cryothermy ablation with clipping of LA appendage    Past Surgical History  Procedure Laterality Date  . Appendectomy    . Hernia repair    . Shoulder surgery Left     clavicular fracture  . Arm surgery  Right     wrist and elbow  . Cardiac catheterization N/A 05/15/2015    Procedure: Left Heart Cath and Coronary Angiography;  Surgeon: Tyreque Finken M Martinique, MD;  Location: [facility];  Service: Cardiovascular;  Laterality: N/A;  . Coronary artery bypass graft N/A 05/29/2015    Procedure: CORONARY ARTERY BYPASS GRAFTING (CABG), ON PUMP, TIMES THREE, USING LEFT INTERNAL MAMMARY ARTERY, BILATERAL GREATER SAPHENOUS VEINS HARVESTED ENDOSCOPICALLY;  Surgeon: Rexene Alberts, MD;  Location: [facility];  Service: Open Heart Surgery;  Laterality: N/A;  . Maze N/A 05/29/2015    Procedure: MAZE;  Surgeon: Rexene Alberts, MD;  Location: [facility];  Service: Open Heart Surgery;  Laterality: N/A;  Complete Bi-Atrial Lesion set with Ablation and Cryothermy  . Tee without cardioversion N/A 05/29/2015    Procedure: TRANSESOPHAGEAL ECHOCARDIOGRAM (TEE);  Surgeon: Rexene Alberts, MD;  Location: [facility];  Service: Open Heart Surgery;  Laterality: N/A;  . Clipping of atrial appendage  05/29/2015    Procedure: CLIPPING OF LEFT ATRIAL APPENDAGE;  Surgeon: Rexene Alberts, MD;  Location: [facility];  Service: Open Heart Surgery;;  45 AtriClip PRO 145     Current Outpatient Prescriptions  Medication Sig Dispense Refill  . ALPRAZolam (XANAX) 0.25 MG tablet Take 1 tablet (0.25 mg total) by mouth 1 day or 1 dose. For anxiety or dysphagia (Patient taking differently: Take 0.25 mg by mouth 2 (two) times daily as needed for anxiety. For anxiety or dysphagia) 30 tablet 5  . amLODipine (NORVASC) 10 MG tablet Take 15 mg by mouth daily.    Marland Kitchen aspirin 81 MG EC tablet Take 1 tablet (81 mg total) by mouth daily.    . diazepam (VALIUM) 10 MG tablet Take 1 tablet (10 mg total) by mouth every 6 (six) hours as needed for anxiety.  (Patient taking differently: Take 10 mg by mouth every 4 (four) hours as needed for anxiety. ) 120 tablet 1  . metoprolol succinate (TOPROL-XL) 25 MG 24 hr tablet Take 1 tablet (25 mg total) by mouth daily. 30 tablet 1  . oxyCODONE (OXY IR/ROXICODONE) 5 MG immediate release tablet Take 1-2 tablets (5-10 mg total) by mouth every 3 (three) hours as needed for severe pain. 30 tablet 0  . rivaroxaban (XARELTO) 20 MG TABS tablet Take 1 tablet (20 mg total) by mouth daily with supper. 30 tablet 11  . rosuvastatin (CRESTOR) 20 MG tablet Take 1 tablet (20 mg total) by mouth daily. 90 tablet 3  . sildenafil (REVATIO) 20 MG tablet Take 5 tablets by mouth daily as needed for ED. (Patient taking differently: 100 mg 3 (three) times daily as needed. Take 5 tablets by mouth daily as needed for ED.) 50 tablet 11  . temazepam (RESTORIL) 30 MG capsule 1 at 7pm to prepare for bed 30 capsule 5  . traMADol (ULTRAM) 50 MG tablet Take 1-2 tablets (50-100 mg total) by mouth every 4 (four) hours as needed for moderate pain. 30 tablet 0  . zolpidem (AMBIEN) 10 MG tablet 1 at bedtime and 1 when wakes at 3am (Patient taking differently: Take 10 mg by mouth at bedtime. ) 60 tablet 5   No current facility-administered medications for this visit.    Allergies:   Amitriptyline; Heparin; Other; and Serotonin reuptake inhibitors (ssris)    Social History:  The patient  reports that he has never smoked. He has never used smokeless tobacco. He reports that he does not drink alcohol or use illicit drugs.   Family History:  The patient's family history includes Cancer in his mother; Other in his sister.    ROS:  Please see the history of present illness.   Otherwise, review of systems are positive for none.   All other systems are reviewed and negative.    PHYSICAL EXAM: VS:  BP 138/94 mmHg  Pulse 80  Ht 5' 8.5" (1.74 m)  Wt 85.928 kg (189 lb 7 oz)  BMI 28.38 kg/m2 , BMI Body mass index is 28.38 kg/(m^2). GEN: Well  nourished, well developed, in no acute distress.  HEENT: normal Neck: no JVD, carotid bruits, or masses Cardiac: IRRR; no murmurs, rubs, or gallops. Normal S1-2. No edema. Respiratory:  clear to auscultation bilaterally, normal work of breathing Chest: well healed sternotomy scar. GI: soft, nontender, nondistended, + BS MS: no deformity or atrophy Skin: warm and dry, no rash Neuro:  Strength and sensation are intact Psych: euthymic mood, full affect   EKG:  EKG is ordered today. The ekg ordered today demonstrates Atrial fibrillation with rate 81 bpm. Frequent PVCs. Nonspecific ST-T changes. I have personally reviewed  and interpreted this study.    Recent Labs: 11/13/2014: TSH 1.505 05/27/2015: ALT 23 05/30/2015: Magnesium 2.3 06/03/2015: BUN 21*; Creatinine, Ser 1.09; Hemoglobin 11.3*; Platelets 141*; Potassium 3.4*; Sodium 138    Lipid Panel    Component Value Date/Time   CHOL 203* 11/13/2014 1643   TRIG 117 11/13/2014 1643   HDL 54 11/13/2014 1643   CHOLHDL 3.8 11/13/2014 1643   VLDL 23 11/13/2014 1643   LDLCALC 126* 11/13/2014 1643     Lab Results  Component Value Date   WBC 10.2 06/03/2015   HGB 11.3* 06/03/2015   HCT 34.1* 06/03/2015   PLT 141* 06/03/2015   GLUCOSE 126* 06/03/2015   CHOL 203* 11/13/2014   TRIG 117 11/13/2014   HDL 54 11/13/2014   LDLCALC 126* 11/13/2014   ALT 23 05/27/2015   AST 27 05/27/2015   NA 138 06/03/2015   K 3.4* 06/03/2015   CL 100* 06/03/2015   CREATININE 1.09 06/03/2015   BUN 21* 06/03/2015   CO2 30 06/03/2015   TSH 1.505 11/13/2014   PSA 3.09 12/22/2012   INR 1.57* 05/29/2015   HGBA1C 5.5 05/27/2015     Wt Readings from Last 3 Encounters:  06/23/15 85.928 kg (189 lb 7 oz)  06/09/15 88.905 kg (196 lb)  06/04/15 91.445 kg (201 lb 9.6 oz)      Other studies Reviewed: Additional studies/ records that were reviewed today include: none. Review of the above records demonstrates: NA   ASSESSMENT AND PLAN:  1.  Atrial  fibrillation. Rate controlled on low dose metoprolol.   CHAD-VASC score of 2. Recommend continued  anticoagulation with Xarelto 20 mg daily. He is s/p MAZE procedure so there is a chance that he may convert to NSR. Will reassess in 2 months and decide if we should proceed with DCCV versus continue with rate control.  2. CAD severe 3 vessel disease. Now S/p CABG with excellent recovery. Go ahead and begin Cardiac Rehab. Continue statin therapy.   3. PVCs  Chronic- asymptomatic  4. PMR on steroids.  5. History of pulmonary embolus 2010.   6. Hypercholesterolemia. Now on Crestor 20 mg daily. Will check fasting lab work in 2 months.  7. HTN controlled.    Current medicines are reviewed at length with the patient today.  The patient does not have concerns regarding medicines.  The following changes have been made:  See above  Labs/ tests ordered today include:   Orders Placed This Encounter  Procedures  . Basic metabolic panel  . Lipid panel  . Hepatic function panel  . EKG 12-Lead     Disposition:   FU with Dr. Martinique in 2  Months with fasting labs and Ecg.   Signed, Ethleen Lormand Martinique, MD  06/23/2015 12:51 PM    [facility] [address], [place], [place], [address] Phone [phone], Fax [phone]

## 2015-06-23 NOTE — Patient Instructions (Signed)
Continue your current therapy  I will see you in 2 months with fasting lab work and Ecg

## 2015-06-25 ENCOUNTER — Encounter: Payer: Self-pay | Admitting: Cardiology

## 2015-06-26 ENCOUNTER — Encounter: Payer: Self-pay | Admitting: Internal Medicine

## 2015-06-27 ENCOUNTER — Telehealth: Payer: Self-pay | Admitting: Cardiology

## 2015-06-27 MED ORDER — ATORVASTATIN CALCIUM 20 MG PO TABS: 20.0000 mg | ORAL_TABLET | Freq: Every day | ORAL | Status: DC

## 2015-06-27 NOTE — Telephone Encounter (Signed)
Returned call to patient's wife.She stated crestor is causing muscle pain.Spoke to [place] he advised to stop crestor.Start atorvastatin 20 mg daily.Advised to call back if he continues to have muscle pain.

## 2015-06-27 NOTE — Telephone Encounter (Signed)
This was called in and pt p/up on 2/10.

## 2015-06-27 NOTE — Telephone Encounter (Signed)
She wants to know if he can stop taking Crestor? He is having side effects from this medicine. Please call to advise.

## 2015-06-30 ENCOUNTER — Ambulatory Visit: Payer: Self-pay | Admitting: Thoracic Surgery (Cardiothoracic Vascular Surgery)

## 2015-07-02 ENCOUNTER — Telehealth (HOSPITAL_COMMUNITY): Payer: Self-pay | Admitting: Cardiac Rehabilitation

## 2015-07-02 NOTE — Telephone Encounter (Signed)
pc received from pt he is not interested in participating in cardiac rehab at this time. He plans to exercise on his own at home.

## 2015-07-16 ENCOUNTER — Encounter: Payer: Self-pay | Admitting: Cardiology

## 2015-07-23 DIAGNOSIS — M2550 Pain in unspecified joint: Secondary | ICD-10-CM | POA: Diagnosis not present

## 2015-07-23 DIAGNOSIS — R5383 Other fatigue: Secondary | ICD-10-CM | POA: Diagnosis not present

## 2015-07-23 DIAGNOSIS — M353 Polymyalgia rheumatica: Secondary | ICD-10-CM | POA: Diagnosis not present

## 2015-07-23 DIAGNOSIS — Z7952 Long term (current) use of systemic steroids: Secondary | ICD-10-CM | POA: Diagnosis not present

## 2015-07-23 NOTE — Telephone Encounter (Signed)
Spoke to patient 07/22/15.He stated he was doing good.Stated he did not want to attend cardiac rehab after recent CABG.Stated he has been walking daily.Stated he is trying to eat heart healthy diet.Patient was encouraged to think about going to cardiac rehab.Stated he is just not interested.Advised to continue walking and eating healthy.Advsied to keep appointment with Dr.Jordan 08/25/15 at 3:00 pm.Advised to call sooner if needed.

## 2015-07-26 DIAGNOSIS — J189 Pneumonia, unspecified organism: Secondary | ICD-10-CM | POA: Diagnosis not present

## 2015-08-11 ENCOUNTER — Ambulatory Visit (INDEPENDENT_AMBULATORY_CARE_PROVIDER_SITE_OTHER): Payer: Self-pay | Admitting: Thoracic Surgery (Cardiothoracic Vascular Surgery)

## 2015-08-11 ENCOUNTER — Encounter: Payer: Self-pay | Admitting: Thoracic Surgery (Cardiothoracic Vascular Surgery)

## 2015-08-11 VITALS — BP 166/99 | HR 79 | Resp 16 | Ht 68.5 in | Wt 185.0 lb

## 2015-08-11 DIAGNOSIS — I484 Atypical atrial flutter: Secondary | ICD-10-CM

## 2015-08-11 DIAGNOSIS — Z8679 Personal history of other diseases of the circulatory system: Secondary | ICD-10-CM

## 2015-08-11 DIAGNOSIS — I4892 Unspecified atrial flutter: Secondary | ICD-10-CM | POA: Insufficient documentation

## 2015-08-11 DIAGNOSIS — Z951 Presence of aortocoronary bypass graft: Secondary | ICD-10-CM

## 2015-08-11 DIAGNOSIS — Z9889 Other specified postprocedural states: Secondary | ICD-10-CM

## 2015-08-11 MED ORDER — METOPROLOL SUCCINATE ER 25 MG PO TB24: 25.0000 mg | ORAL_TABLET | Freq: Every day | ORAL | Status: DC

## 2015-08-11 NOTE — Patient Instructions (Addendum)
Continue to avoid any heavy lifting or strenuous use of your arms or shoulders for at least a total of three months from the time of surgery.  After three months you may gradually increase how much you lift or otherwise use your arms or chest as tolerated, with limits based upon whether or not activities lead to the return of significant discomfort.  Resume taking Toprol XL as previously prescribed.  Continue all previous medications without any changes at this time  Monitor your blood pressure daily and bring a record to your next office visit with Dr. Martinique

## 2015-08-11 NOTE — Progress Notes (Signed)
[name]       [name] [address]             [phone]     CARDIOTHORACIC SURGERY OFFICE NOTE  Referring Provider is Martinique, Peter M, MD PCP is [name], MD   HPI:  Patient returns to the office today for routine follow-up and rhythm check nearly 3 months status post cord artery bypass grafting and Maze procedure on 05/29/2015. His early postoperative recovery was uneventful and he was discharged home on the sixth postoperative day. He was last seen here in our office on 06/09/2015 and since then he has been seen in follow-up by Dr. Martinique on 06/23/2015. He returns to our office for routine follow-up today.  He states that overall he has done quite well and he is pleased with his progress. He did have an upper respiratory tract infection for which she was treated with a single dose of intravenous Rocephin and given a Z-Pak from an urgent care physician a proximally 2 weeks ago. Symptoms gradually resolved over a period of 5-7 days. The patient states that he currently feels quite well. He no longer has any significant soreness in his chest. He states that his exercise tolerance is good but he complains that he has not been able to lift any weights. He has decided not to participate in outpatient cardiac rehabilitation program. He has been walking regularly on his own. He denies any symptoms of shortness of breath with exertion. He is not having any palpitations or dizzy spells. He is not having any significant chest pain. He remains chronically anticoagulated using Xarelto.  His prescription for Toprol-XL expired and he did not renew it.   Current Outpatient Prescriptions  Medication Sig Dispense Refill  . ALPRAZolam (XANAX) 0.25 MG tablet Take 1 tablet (0.25 mg total) by mouth 1 day or 1 dose. For anxiety or dysphagia (Patient taking differently: Take 0.25 mg by mouth 2 (two) times daily as needed for anxiety. For anxiety or dysphagia) 30 tablet 5  .  amLODipine (NORVASC) 10 MG tablet Take 15 mg by mouth daily.    Marland Kitchen aspirin 81 MG EC tablet Take 1 tablet (81 mg total) by mouth daily.    Marland Kitchen atorvastatin (LIPITOR) 20 MG tablet Take 1 tablet (20 mg total) by mouth daily. 30 tablet 6  . diazepam (VALIUM) 10 MG tablet Take 1 tablet (10 mg total) by mouth every 6 (six) hours as needed for anxiety. (Patient taking differently: Take 10 mg by mouth every 4 (four) hours as needed for anxiety. ) 120 tablet 1  . rivaroxaban (XARELTO) 20 MG TABS tablet Take 1 tablet (20 mg total) by mouth daily with supper. 30 tablet 11  . sildenafil (REVATIO) 20 MG tablet Take 5 tablets by mouth daily as needed for ED. (Patient taking differently: 100 mg 3 (three) times daily as needed. Take 5 tablets by mouth daily as needed for ED.) 50 tablet 11  . zolpidem (AMBIEN) 10 MG tablet 1 at bedtime and 1 when wakes at 3am (Patient taking differently: Take 10 mg by mouth at bedtime. ) 60 tablet 5  . metoprolol succinate (TOPROL-XL) 25 MG 24 hr tablet Take 1 tablet (25 mg total) by mouth daily. (Patient not taking: Reported on 08/11/2015) 30 tablet 1  . temazepam (RESTORIL) 30 MG capsule 1 at 7pm to prepare for bed 30 capsule 5   No current facility-administered medications for this visit.  Physical Exam:   BP 166/99 mmHg  Pulse 79  Resp 16  Ht 5' 8.5" (1.74 m)  Wt 185 lb (83.915 kg)  BMI 27.72 kg/m2  SpO2 96%  General:  Well-appearing  Chest:   Clear to auscultation  CV:   Regular rate and rhythm without murmur  Incisions:  Well healed, sternum is stable  Abdomen:  Soft nontender  Extremities:  Warm and well-perfused  Diagnostic Tests:  2 channel telemetry rhythm strip demonstrates what appears to be atrial flutter   Impression:  Patient is overall doing well fairly 3 months status post coronary artery bypass grafting and Maze procedure. He appears to be in atrial flutter with controlled ventricular rate.   His blood pressure is running somewhat high in the  office today.   Plan:  I have refilled the patient's prescription for Toprol-XL and instructed him to resume taking it. He should also continue to take Norvasc. I suggested that he should check his blood pressure every morning when he gets up and review his findings when he goes for his next office visit with Dr. Martinique later this month. It might be reasonable to consider elective DC cardioversion if he continues to remain in atrial flutter. He has not had a follow-up echocardiogram since his surgery.  All of the patient's questions have been addressed.  The patient will return to our office for follow-up and rhythm check in 3 months.    Valentina Gu. Roxy Manns, MD 08/11/2015 12:21 PM

## 2015-08-18 ENCOUNTER — Other Ambulatory Visit: Payer: Self-pay | Admitting: Internal Medicine

## 2015-08-19 NOTE — Telephone Encounter (Signed)
Faxed. Called pt and LM asking for CB if he would like a recommendation for new PCP.

## 2015-08-19 NOTE — Telephone Encounter (Signed)
Call him or wife --ok to refill--does he want my rec for new pcp as i retire??? Meds ordered this encounter  Medications  . diazepam (VALIUM) 10 MG tablet    Sig: TAKE 1 TABLET EVERY 6 HOURS AS NEEDED FOR ANXIETY.    Dispense:  120 tablet    Refill:  0

## 2015-08-20 ENCOUNTER — Ambulatory Visit (INDEPENDENT_AMBULATORY_CARE_PROVIDER_SITE_OTHER): Payer: Medicare Other | Admitting: Internal Medicine

## 2015-08-20 ENCOUNTER — Encounter: Payer: Self-pay | Admitting: Internal Medicine

## 2015-08-20 VITALS — BP 143/94 | HR 80 | Temp 98.0°F | Resp 16 | Ht 69.0 in | Wt 186.0 lb

## 2015-08-20 DIAGNOSIS — I10 Essential (primary) hypertension: Secondary | ICD-10-CM

## 2015-08-20 DIAGNOSIS — F419 Anxiety disorder, unspecified: Secondary | ICD-10-CM

## 2015-08-20 DIAGNOSIS — N529 Male erectile dysfunction, unspecified: Secondary | ICD-10-CM | POA: Diagnosis not present

## 2015-08-20 DIAGNOSIS — I2510 Atherosclerotic heart disease of native coronary artery without angina pectoris: Secondary | ICD-10-CM | POA: Diagnosis not present

## 2015-08-20 DIAGNOSIS — G4700 Insomnia, unspecified: Secondary | ICD-10-CM | POA: Diagnosis not present

## 2015-08-20 DIAGNOSIS — M353 Polymyalgia rheumatica: Secondary | ICD-10-CM | POA: Diagnosis not present

## 2015-08-20 MED ORDER — TEMAZEPAM 30 MG PO CAPS: ORAL_CAPSULE | ORAL | Status: DC

## 2015-08-20 MED ORDER — DIAZEPAM 10 MG PO TABS: ORAL_TABLET | ORAL | Status: DC

## 2015-08-20 MED ORDER — ZOLPIDEM TARTRATE 10 MG PO TABS: 10.0000 mg | ORAL_TABLET | Freq: Every day | ORAL | Status: DC

## 2015-08-20 MED ORDER — SILDENAFIL CITRATE 20 MG PO TABS: ORAL_TABLET | ORAL | Status: DC

## 2015-08-20 MED ORDER — HYDROXYZINE HCL 50 MG PO TABS: ORAL_TABLET | ORAL | Status: DC

## 2015-08-20 MED ORDER — AMLODIPINE BESYLATE 10 MG PO TABS: 15.0000 mg | ORAL_TABLET | Freq: Every day | ORAL | Status: DC

## 2015-08-20 NOTE — Progress Notes (Signed)
Subjective:    Patient ID: Peter Bruce, male    DOB: 23-Mar-1951, 65 y.o.   MRN: [id number]  HPI  Chief Complaint  Patient presents with  . Follow-up--for 1st time since bypass  . Hypertension---? Recently BP up!  . Medication Refill--multiple    Patient Active Problem List   Diagnosis Date Noted  . Atrial flutter ([place])--- noted by Dr. Ricard Dillon on 4/ 3/ 17    . S/P CABG x 3 and maze procedure 05/29/2015  . S/P Maze operation for atrial fibrillation-- 05/29/2015  . CAD, multiple vessel 05/16/2015  . Abnormal nuclear stress test 05/15/2015  . Coronary artery disease involving native coronary artery --- As part of his evaluation for fatigue and declining energy he was referred to cardiology because of his history of atrial fibrillation back in November 2016 and ultimately was discovered to have severe multivessel coronary artery disease. --3 main vessels with preserved left ventricular function.  Procedure (s): Coronary Artery Bypass Grafting x 3 Left Internal Mammary Artery to Distal Left Anterior Descending Coronary Artery Saphenous Vein Graft to Distal Right Coronary Artery Saphenous Vein Graft to Second Obtuse Marginal Branch of Left Circumflex Coronary Artery Endoscopic Vein Harvest from Bilateral Thighs and Right Lower Leg   Maze Procedure Complete bilateral atrial lesion set using bipolar radiofrequency and cryothermy ablation Clipping of LA appendage (Atriclip, size 3mm) by Dr. Roxy Manns on 05/29/2015. Remains on Xarelto 05/15/2015  . Fatigue 04/14/2015  . HTN (hypertension)--Norvasc 10,  04/14/2015  . Dyslipidemia 04/14/2015  . Polymyalgia rheumatica--rheumatol [facility]-12/2013----Has been off prednisone since surgery without too many symptoms and will follow-up with rheumatology  12/19/2013  . BMI 31.0-31.9,adult Wt Readings from Last 3 Encounters:  08/20/15 186 lb (84.369 kg)  08/11/15 185 lb (83.915 kg)  06/23/15 189 lb 7 oz (85.928 kg)    02/23/2012  .  Shoulder pain---Part of the general pattern pain all over that may go along with polymyalgia rheumatica  01/20/2012  . Acromioclavicular joint arthritis 01/20/2012  . BPH (benign prostatic hyperplasia)--Minimal symptoms  08/30/2011  . ED (erectile dysfunction) 08/30/2011  . Hypersomnolence--He has a funny sleep disorder -lots of fatigue In the aftermath of his mother's serious illness in December Dr. Gaynelle Arabian started him on Adderall for hypersomnolence as he was unable to get through the work day  08/30/2011  . Insomnia--Takes Restoril at 7 PM and Ambien 8 PM or go to sleep wakes at 1 AM and takes Ambien again so he can sleep 4 AM when he gets up to go to work he remains exhausted during the day. He declares he needs all this medication to be able to sleep and has done this for years preceding my involve,ment as PCP. He denies sleep apnea sxt. Fatigued often and occas daytime hypersomnolence. 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 He prefers to use these medicines rather than being on chronic narcotics for pain. His wife is never observed apnea. He has resisted going for a sleep study.    . Anxiety----still needs alpraz prn//valium prn 04/21/2011  . PULMONARY EMBOLISM--hx of 10/02/2008  . NEPHROLITHIASIS 05/26/0  . FIBROMYALGIA---this dx was replaced with the PMR dx 10/02/2008  . Lupus anticoagulant positive 07/29/2008   Daily BPs=130/80 usually  07/26/15--[facility] of Pneum[facility] roceph/zith//feels better  Dysphagia when anxious  Nonsmoker //no etoh/no drugs  Review of Systems  Constitutional: Positive for fatigue. Negative for unexpected weight change.  He is getting stronger following his surgery  HENT: Positive for trouble swallowing. Negative for voice change.   Eyes: Negative for visual disturbance.  Respiratory: Negative for chest tightness and shortness of breath.     Cardiovascular: Negative for chest pain, palpitations and leg swelling.       He still complains of some pain in the area where his venous grafts were taken from  Gastrointestinal: Negative for abdominal pain, diarrhea and constipation.  Genitourinary: Negative for difficulty urinating.       Note history of erectile dysfunction and follow urology over the years  Musculoskeletal: Positive for myalgias and arthralgias.  Skin: Negative for rash.  Neurological: Negative for dizziness and headaches.  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 No murmur Lungs clear Extremities without edema/good peripheral pulses Cranial nerves II-12 intact Mood stable Affect is apprehensive about all of his problems-about possible medication changes-about the aftermath of his surgery-about his continued problems with loss of revenue at work      Assessment & Plan:  Anxiety--This continues to be significant/he has not been in favor of using long-acting medications like SSRIs   Erectile dysfunction, unspecified erectile dysfunction type - Plan: sildenafil (REVATIO) 20 MG tabletRefilled  Insomnia - Plan: zolpidem (AMBIEN) 10 MG tablet, temazepam (RESTORIL) 30 MG capsule///he is very unhappy that I'll not prescribe the extra Ambien tablet that he has been getting in the past. I explained that yet is in his best interest -the dose should be at no more than 10 mg a night--he will be offered 50 mg hydroxyzine at 2 AM if needed I think he needs to have a  full sleep evaluation though he is still resistant  Essential hypertension--- not completely controlled//he has follow-up soon with Dr. Martinique so nothing will be added at this point  Polymyalgia rheumatica--rheumatol [facility] 2015//he will follow up there soon  Cardiac arrhythmia incompletely resolved after surgery--f/u Cardiology  Meds ordered this encounter  Medications  . amLODipine (NORVASC) 10 MG tablet    Sig: Take 1.5 tablets (15 mg total) by mouth daily.    Dispense:  135 tablet    Refill:  1  . sildenafil (REVATIO) 20 MG tablet    Sig: Take 5 tablets by mouth daily as needed for ED.    Dispense:  50 tablet    Refill:  11  . zolpidem (AMBIEN) 10 MG tablet    Sig: Take 1 tablet (10 mg total) by mouth at bedtime.    Dispense:  30 tablet    Refill:  5  . temazepam (RESTORIL) 30 MG capsule    Sig: 1 at 7pm to prepare for bed    Dispense:  30 capsule    Refill:  5  . diazepam (VALIUM) 10 MG tablet    Sig: TAKE 1 TABLET EVERY 6 HOURS AS NEEDED FOR ANXIETY.    Dispense:  120 tablet    Refill:  5  . hydrOXYzine (ATARAX/VISTARIL)  50 MG tablet    Sig: Take one at 3 am if wakes early    Dispense:  30 tablet    Refill:  5    Needs GI evaluation for dysphagia//past due colonoscopy as well Needs consideration of counseling or psychiatry Needs a sleep evaluation -I'll send a letter urging all of this  We discussed new primary provider as I retire//he is still deciding//he has seen Harrison Mons here in the past

## 2015-08-20 NOTE — Patient Instructions (Signed)
     IF you received an x-ray today, you will receive an invoice from [facility]. Please contact [facility] at [phone] with questions or concerns regarding your invoice.   IF you received labwork today, you will receive an invoice from [facility]. Please contact [facility] at [phone] with questions or concerns regarding your invoice.   Our billing staff will not be able to assist you with questions regarding bills from these companies.  You will be contacted with the lab results as soon as they are available. The fastest way to get your results is to activate your My Chart account. Instructions are located on the last page of this paperwork. If you have not heard from us regarding the results in 2 weeks, please contact this office.      

## 2015-08-25 ENCOUNTER — Encounter: Payer: Self-pay | Admitting: Cardiology

## 2015-08-25 ENCOUNTER — Other Ambulatory Visit: Payer: Self-pay

## 2015-08-25 ENCOUNTER — Ambulatory Visit (INDEPENDENT_AMBULATORY_CARE_PROVIDER_SITE_OTHER): Payer: Medicare Other | Admitting: Cardiology

## 2015-08-25 VITALS — BP 162/92 | HR 105 | Ht 69.0 in | Wt 188.0 lb

## 2015-08-25 DIAGNOSIS — E785 Hyperlipidemia, unspecified: Secondary | ICD-10-CM

## 2015-08-25 DIAGNOSIS — I10 Essential (primary) hypertension: Secondary | ICD-10-CM

## 2015-08-25 DIAGNOSIS — I4891 Unspecified atrial fibrillation: Secondary | ICD-10-CM

## 2015-08-25 DIAGNOSIS — I2510 Atherosclerotic heart disease of native coronary artery without angina pectoris: Secondary | ICD-10-CM

## 2015-08-25 DIAGNOSIS — Z951 Presence of aortocoronary bypass graft: Secondary | ICD-10-CM

## 2015-08-25 DIAGNOSIS — Z9889 Other specified postprocedural states: Secondary | ICD-10-CM | POA: Diagnosis not present

## 2015-08-25 DIAGNOSIS — Z8679 Personal history of other diseases of the circulatory system: Secondary | ICD-10-CM

## 2015-08-25 DIAGNOSIS — I484 Atypical atrial flutter: Secondary | ICD-10-CM

## 2015-08-25 MED ORDER — METOPROLOL SUCCINATE ER 25 MG PO TB24: 25.0000 mg | ORAL_TABLET | Freq: Every day | ORAL | Status: DC

## 2015-08-25 MED ORDER — AMLODIPINE BESYLATE 10 MG PO TABS: 15.0000 mg | ORAL_TABLET | Freq: Every day | ORAL | Status: DC

## 2015-08-25 NOTE — Patient Instructions (Signed)
Continue your current therapy  I will see you in 6 months.   

## 2015-08-25 NOTE — Progress Notes (Signed)
Cardiology Office Note   Date:  08/25/2015   ID:  Peter Bruce, DOB [birth year], MRN [id number]  PCP:  [name], MD  Cardiologist:   Peter Martinique, MD   Chief Complaint  Patient presents with  . Follow-up      History of Present Illness: Peter Bruce is a 65 y.o. male is seen for  follow up for CAD and Afib/flutter.  He was seen in December 2016 with complaints of worsening fatigue and weakness with exertion. He denied any chest pain or SOB. He was found to be in Afib with controlled rate. He was anticoagulated with Xarelto. An Echo was normal. Myoview study showed multiple perfusion abnormalities. He subsequently underwent cardiac cath on 05/15/15 which demonstrated severe 3 vessel CAD with normal LV function. On 05/29/15 he had surgery by Dr. Roxy Manns with LIMA to the LAD, SVG to the RCA, SVG to the OM. He also had a MAZE procedure and clipping of the atrial appendage. His post op course was uncomplicated. On follow up visits he has persistent atypical atrial flutter.  He denies any chest pain, SOB, palpitations, or dizziness. His only complaint is that he feels a little tired in the afternoon but he thinks this is normal. He has been monitoring his BP at home and it has consistently been between [id number] systolic. Overall he feels very well.     Past Medical History  Diagnosis Date  . Hypertension   . Arthritis   . Neuromuscular disorder ([place])   . PMR (polymyalgia rheumatica) (H[facility])   . Acute pulmonary embolus ([place]) 2010  . Hypercholesterolemia   . Atrial fibrillation (Peter Bruce)   . Coronary artery disease involving native coronary artery 05/15/2015    multivessel  . Lupus anticoagulant positive 07/29/2008  . History of kidney stones   . 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 S/P Maze operation for atrial fibrillation 05/29/2015    Complete bilateral atrial lesion set via median sternotomy using bipolar radiofrequency  and cryothermy ablation with clipping of LA appendage  . Atrial flutter [facility])     Past Surgical History  Procedure Laterality Date  . Appendectomy    . Hernia repair    . Shoulder surgery Left     clavicular fracture  . Arm surgery  Right     wrist and elbow  . Cardiac catheterization N/A 05/15/2015    Procedure: Left Heart Cath and Coronary Angiography;  Surgeon: Peter M Martinique, MD;  Location: [place] [facility];  Service: Cardiovascular;  Laterality: N/A;  . Coronary artery bypass graft N/A 05/29/2015    Procedure: CORONARY ARTERY BYPASS GRAFTING (CABG), ON PUMP, TIMES THREE, USING LEFT INTERNAL MAMMARY ARTERY, BILATERAL GREATER SAPHENOUS VEINS HARVESTED ENDOSCOPICALLY;  Surgeon: Rexene Alberts, MD;  Location: [place];  Service: Open Heart Surgery;  Laterality: N/A;  . Maze N/A 05/29/2015    Procedure: MAZE;  Surgeon: Rexene Alberts, MD;  Location: [place];  Service: Open Heart Surgery;  Laterality: N/A;  Complete Bi-Atrial Lesion set with Ablation and Cryothermy  . Tee without cardioversion N/A 05/29/2015    Procedure: TRANSESOPHAGEAL ECHOCARDIOGRAM (TEE);  Surgeon: Rexene Alberts, MD;  Location: [place];  Service: Open Heart Surgery;  Laterality: N/A;  . Clipping of atrial appendage  05/29/2015    Procedure: CLIPPING OF LEFT ATRIAL APPENDAGE;  Surgeon: Rexene Alberts, MD;  Location: [place];  Service: Open Heart Surgery;;  45 AtriClip PRO 145     Current Outpatient Prescriptions  Medication Sig Dispense Refill  . amLODipine (NORVASC) 10 MG tablet Take 1.5 tablets (15 mg total) by mouth daily. 135 tablet 3  . atorvastatin (LIPITOR) 20 MG tablet Take 1 tablet (20 mg total) by mouth daily. 30 tablet 6  . diazepam (VALIUM) 10 MG tablet TAKE 1 TABLET EVERY 6 HOURS AS NEEDED FOR ANXIETY. 120 tablet 5  . hydrOXYzine (ATARAX/VISTARIL) 50 MG tablet Take one at 3 am if wakes early 30 tablet 5  . metoprolol succinate (TOPROL-XL) 25 MG 24 hr tablet Take 1 tablet (25 mg total) by mouth daily. 90 tablet 3   . rivaroxaban (XARELTO) 20 MG TABS tablet Take 1 tablet (20 mg total) by mouth daily with supper. 30 tablet 11  . sildenafil (REVATIO) 20 MG tablet Take 5 tablets by mouth daily as needed for ED. 50 tablet 11  . temazepam (RESTORIL) 30 MG capsule 1 at 7pm to prepare for bed 30 capsule 5  . zolpidem (AMBIEN) 10 MG tablet Take 1 tablet (10 mg total) by mouth at bedtime. 30 tablet 5   No current facility-administered medications for this visit.    Allergies:   Amitriptyline; Heparin; Other; and Serotonin reuptake inhibitors (ssris)    Social History:  The patient  reports that he has never smoked. He has never used smokeless tobacco. He reports that he does not drink alcohol or use illicit drugs.   Family History:  The patient's family history includes Cancer in his mother; Other in his sister.    ROS:  Please see the history of present illness.   Otherwise, review of systems are positive for none.   All other systems are reviewed and negative.    PHYSICAL EXAM: VS:  BP 162/92 mmHg  Pulse 105  Ht 5\' 9"  (1.753 m)  Wt 85.276 kg (188 lb)  BMI 27.75 kg/m2 , BMI Body mass index is 27.75 kg/(m^2). GEN: Well nourished, well developed, in no acute distress.  HEENT: normal Neck: no JVD, carotid bruits, or masses Cardiac: RRR; no murmurs, rubs, or gallops. Normal S1-2. No edema. Respiratory:  clear to auscultation bilaterally, normal work of breathing Chest: well healed sternotomy scar. GI: soft, nontender, nondistended, + BS MS: no deformity or atrophy Skin: warm and dry, no rash Neuro:  Strength and sensation are intact Psych: euthymic mood, full affect   EKG:  EKG is ordered today. The ekg ordered today demonstrates Atrial flutter- atypical with rate 105 bpm. Nonspecific ST-T changes. I have personally reviewed and interpreted this study.    Recent Labs: 11/13/2014: TSH 1.505 05/27/2015: ALT 23 05/30/2015: Magnesium 2.3 06/03/2015: BUN 21*; Creatinine, Ser 1.09; Hemoglobin 11.3*;  Platelets 141*; Potassium 3.4*; Sodium 138    Lipid Panel    Component Value Date/Time   CHOL 203* 11/13/2014 1643   TRIG 117 11/13/2014 1643   HDL 54 11/13/2014 1643   CHOLHDL 3.8 11/13/2014 1643   VLDL 23 11/13/2014 1643   LDLCALC 126* 11/13/2014 1643     Lab Results  Component Value Date   WBC 10.2 06/03/2015   HGB 11.3* 06/03/2015   HCT 34.1* 06/03/2015   PLT 141* 06/03/2015   GLUCOSE 126* 06/03/2015   CHOL 203* 11/13/2014   TRIG 117 11/13/2014   HDL 54 11/13/2014   LDLCALC 126* 11/13/2014   ALT 23 05/27/2015   AST 27 05/27/2015   NA 138 06/03/2015   K 3.4* 06/03/2015   CL  100* 06/03/2015   CREATININE 1.09 06/03/2015   BUN 21* 06/03/2015   CO2 30 06/03/2015   TSH 1.505 11/13/2014   PSA 3.09 12/22/2012   INR 1.57* 05/29/2015   HGBA1C 5.5 05/27/2015     Wt Readings from Last 3 Encounters:  08/25/15 85.276 kg (188 lb)  08/20/15 84.369 kg (186 lb)  08/11/15 83.915 kg (185 lb)      Other studies Reviewed: Additional studies/ records that were reviewed today include: none. Review of the above records demonstrates: NA   ASSESSMENT AND PLAN:  1.  Atrial flutter persistent. Rate fairly well controlled on low dose metoprolol.   CHAD-VASC score of 2. Recommend continued  anticoagulation with Xarelto 20 mg daily. He is s/p MAZE procedure but has not converted on his own.  We discussed proceeding with DCCV at this time versus rate control. He is asymptomatic and does not want to have a cardioversion done. I will follow up in 6 months. If he should develop symptoms during this time we could reconsider.  2. CAD severe 3 vessel disease. Now S/p CABG with excellent recovery.  Continue statin therapy. Encourage healthy diet and regular exercise.  3. PVCs  Chronic- asymptomatic  4. PMR on steroids.  5. History of pulmonary embolus 2010.   6. Hypercholesterolemia. Now on Crestor 20 mg daily. Will arrange for fasting lab work.  7.  HTN controlled.    Current  medicines are reviewed at length with the patient today.  The patient does not have concerns regarding medicines.  The following changes have been made:  See above  Labs/ tests ordered today include:   Orders Placed This Encounter  Procedures  . EKG 12-Lead     Disposition:   FU with Dr. Martinique in 6  Months.  Signed, Peter Martinique, MD  08/25/2015 5:35 PM    [facility] [address], [place], [place], [address] Phone [phone], Fax [phone]

## 2015-08-26 ENCOUNTER — Telehealth: Payer: Self-pay

## 2015-08-26 NOTE — Telephone Encounter (Signed)
Patient called no answer.Left message on personal voice mail Dr.Jordan advised needs to have fasting lab bmet,lipid,hepatic panels.Lab orders mailed,need to have done at [facility] lab at [employer]

## 2015-08-27 ENCOUNTER — Telehealth: Payer: Self-pay

## 2015-08-27 NOTE — Telephone Encounter (Signed)
Pharm faxed req for RFs of adderall 10 mg. Stated that all previous prescriptions written on 11/14/14 have expired and need updating. I saw notes from 4/12 OV that pt had been Rxd these for hypersomnolence, but couldn't tell if pt was still taking it, or if you discussed use?

## 2015-08-27 NOTE — Telephone Encounter (Signed)
He has an arrhythmia and should no longer use this medicine as it may make it worse If he needs a medicine to help with waking and staying awake, then we need to set up a sleep neurology evaluation to see if he qualifies for one of the meds like provigil or nuvigil

## 2015-08-28 NOTE — Telephone Encounter (Signed)
Called and LM for pt to CB about one of his meds. Need to give pt Dr Doolittle's message and ask if he wants referral.

## 2015-08-28 NOTE — Telephone Encounter (Signed)
Pt CB and stated that he had brought in a list of meds that his cardiologist had said were ok for him to continue when he saw Dr Laney Pastor recently, and adderall was one of them. He stated that nuvigil was very expensive last time he tried to get it. I advised that it may need a PA which could bring down cost. Pt stated that he will check on these meds and call me back if he needs a referral.

## 2015-09-02 ENCOUNTER — Telehealth: Payer: Self-pay | Admitting: Cardiology

## 2015-09-02 DIAGNOSIS — R05 Cough: Secondary | ICD-10-CM | POA: Diagnosis not present

## 2015-09-02 NOTE — Telephone Encounter (Signed)
New message      [facility] because of a cough.  Pt had pneumonia 5 weeks ago.  The NP took an xray and told pt he had CHF and prescribed lasix.  Patient and wife is very upset saying pts cardiologist never diagnosed him with CHF and he was just seen last week.  He did not get the lasix.  They want to talk to a nurse.  Please call

## 2015-09-02 NOTE — Telephone Encounter (Signed)
Returned call to Beverlee Nims, wife of patient. She explains that they were seen in urgent care ([facility]) yesterday due to patient having similar symptoms to his episode of pneumonia several weeks ago. Had CXR performed which showed interpreted finding of fluid around heart. The nurse practitioner at [facility] prescribed lasix and sent pt home. Wife states that she is concerned about pt going on this medication w/o confirmation from cardiology. Notes no history of CHF. We did review his labs, noted last time he had BMET his potassium was low - wife felt more comfortable w/ having Dr. Martinique review before he goes on the lasix - to see if oral K, and/or other recommendations advised  I did explain that if pt was having uncontrolled A Fib, could be some concern for HF. She denies leg or abdominal swelling, fatigue, dyspnea, abnormal HR.  Aware a recommendation to see PA here likely - she will wait on Dr. Doug Sou advice. She is aware he is out of office, OK w/ response tomorrow or later in the week. States urgent care should be sending a fax to [place] today w/ their office notes.  Will defer to Dr. Martinique.

## 2015-09-03 NOTE — Telephone Encounter (Signed)
Agree with Butch Penny that he could have some CHF related to persistent atrial flutter but he seemed to be asymptomatic on resent visit and did not want to consider cardioversion. I cannot comment on CXR findings since it is not in our system. If we can get a copy of CXR I would be happy to review and compare with study done 06/09/15.  Errol Ala Martinique MD, [facility]

## 2015-09-03 NOTE — Telephone Encounter (Signed)
Spoke to patient's wife.Advised of Dr.Jordan's recommendations.Advised I will call [facility] and obtain cxr report for Dr.Jordan's review.I will call you back tomorrow 09/04/15 with Dr.Jordan's advice.  Spoke to [facility] a CD of cxr will be left at front desk 4/27 for wife to pick up.Spoke to patient's wife she will pick up CD of cxr 4/27 and bring to office for Dr.Jordan's review.

## 2015-09-05 ENCOUNTER — Telehealth: Payer: Self-pay | Admitting: Cardiology

## 2015-09-05 ENCOUNTER — Other Ambulatory Visit: Payer: Self-pay | Admitting: Cardiology

## 2015-09-05 DIAGNOSIS — I4891 Unspecified atrial fibrillation: Secondary | ICD-10-CM

## 2015-09-05 DIAGNOSIS — I2510 Atherosclerotic heart disease of native coronary artery without angina pectoris: Secondary | ICD-10-CM

## 2015-09-05 DIAGNOSIS — I10 Essential (primary) hypertension: Secondary | ICD-10-CM

## 2015-09-05 NOTE — Telephone Encounter (Signed)
Returned call to patient's wife.Dr.Jordan reviewed cxr done at [facility].He advised cxr similar to cxr done in hospital after his heart surgery.He advised patient needs a cardioversion.Cardioversion scheduled Tue 09/09/15 at 2:00 pm at [facility] with Dr.Hilty,arrive at 12:30 pm.Patient to pick up instructions and have pre procedure lab on Mon 5/1/7.

## 2015-09-05 NOTE — Telephone Encounter (Signed)
Returned call, spoke to [place]. Patient is starting lasix this am  Wanted to get Dr. Doug Sou interpretation of CXR  Spoke to [place] - CXR image disc received. Report unchanged from Jan, per Dr. Martinique. Malachy Mood will call patient later to set up cardioversion.  Called Beverlee Nims back and made her aware of this - she voiced thanks.

## 2015-09-05 NOTE — Telephone Encounter (Signed)
New message      Calling to tell the nurse pt is going to start lasix today.  He is in AFIB.  Please call

## 2015-09-05 NOTE — Telephone Encounter (Signed)
Lasix refill refused - Rx'ed in Jan post op and was only to take for 7 days.

## 2015-09-08 ENCOUNTER — Other Ambulatory Visit (INDEPENDENT_AMBULATORY_CARE_PROVIDER_SITE_OTHER): Payer: Medicare Other

## 2015-09-08 DIAGNOSIS — I2510 Atherosclerotic heart disease of native coronary artery without angina pectoris: Secondary | ICD-10-CM | POA: Diagnosis not present

## 2015-09-08 DIAGNOSIS — I4891 Unspecified atrial fibrillation: Secondary | ICD-10-CM | POA: Diagnosis not present

## 2015-09-08 DIAGNOSIS — I10 Essential (primary) hypertension: Secondary | ICD-10-CM

## 2015-09-08 LAB — CBC WITH DIFFERENTIAL/PLATELET
BASOS ABS: 108 {cells}/uL (ref 0–200)
Basophils Relative: 1 %
EOS ABS: 108 {cells}/uL (ref 15–500)
EOS PCT: 1 %
HCT: 38.8 % (ref 38.5–50.0)
HEMOGLOBIN: 12.9 g/dL — AB (ref 13.2–17.1)
LYMPHS ABS: 972 {cells}/uL (ref 850–3900)
Lymphocytes Relative: 9 %
MCH: 30.1 pg (ref 27.0–33.0)
MCHC: 33.2 g/dL (ref 32.0–36.0)
MCV: 90.7 fL (ref 80.0–100.0)
MPV: 11 fL (ref 7.5–12.5)
Monocytes Absolute: 1080 cells/uL — ABNORMAL HIGH (ref 200–950)
Monocytes Relative: 10 %
NEUTROS ABS: 8532 {cells}/uL — AB (ref 1500–7800)
Neutrophils Relative %: 79 %
Platelets: 334 10*3/uL (ref 140–400)
RBC: 4.28 MIL/uL (ref 4.20–5.80)
RDW: 14.6 % (ref 11.0–15.0)
WBC: 10.8 10*3/uL (ref 3.8–10.8)

## 2015-09-08 LAB — BASIC METABOLIC PANEL
BUN: 33 mg/dL — ABNORMAL HIGH (ref 7–25)
CO2: 26 mmol/L (ref 20–31)
Calcium: 8.8 mg/dL (ref 8.6–10.3)
Chloride: 104 mmol/L (ref 98–110)
Creat: 1.43 mg/dL — ABNORMAL HIGH (ref 0.70–1.25)
Glucose, Bld: 119 mg/dL — ABNORMAL HIGH (ref 65–99)
POTASSIUM: 4.3 mmol/L (ref 3.5–5.3)
SODIUM: 139 mmol/L (ref 135–146)

## 2015-09-08 LAB — PROTIME-INR
INR: 2.77 — ABNORMAL HIGH (ref <1.50)
PROTHROMBIN TIME: 29.7 s — AB (ref 11.6–15.2)

## 2015-09-09 ENCOUNTER — Inpatient Hospital Stay (HOSPITAL_COMMUNITY): Payer: Medicare Other | Admitting: Certified Registered"

## 2015-09-09 ENCOUNTER — Ambulatory Visit (HOSPITAL_COMMUNITY): Payer: Medicare Other

## 2015-09-09 ENCOUNTER — Encounter (HOSPITAL_COMMUNITY)
Admission: RE | Disposition: A | Discharge status: Home / Self Care | Payer: Self-pay | Source: Ambulatory Visit | Attending: Internal Medicine

## 2015-09-09 ENCOUNTER — Inpatient Hospital Stay (HOSPITAL_COMMUNITY)
Admission: RE | Admit: 2015-09-09 | Discharge: 2015-09-12 | DRG: 291 | Disposition: A | Discharge status: Home / Self Care | Payer: Medicare Other | Source: Ambulatory Visit | Attending: Internal Medicine | Admitting: Internal Medicine

## 2015-09-09 ENCOUNTER — Inpatient Hospital Stay (HOSPITAL_COMMUNITY): Payer: Medicare Other

## 2015-09-09 ENCOUNTER — Encounter (HOSPITAL_COMMUNITY): Payer: Self-pay | Admitting: Internal Medicine

## 2015-09-09 DIAGNOSIS — E785 Hyperlipidemia, unspecified: Secondary | ICD-10-CM | POA: Diagnosis present

## 2015-09-09 DIAGNOSIS — R0902 Hypoxemia: Secondary | ICD-10-CM

## 2015-09-09 DIAGNOSIS — I4891 Unspecified atrial fibrillation: Secondary | ICD-10-CM | POA: Diagnosis present

## 2015-09-09 DIAGNOSIS — R790 Abnormal level of blood mineral: Secondary | ICD-10-CM

## 2015-09-09 DIAGNOSIS — R05 Cough: Secondary | ICD-10-CM

## 2015-09-09 DIAGNOSIS — I5031 Acute diastolic (congestive) heart failure: Secondary | ICD-10-CM | POA: Diagnosis present

## 2015-09-09 DIAGNOSIS — R5383 Other fatigue: Secondary | ICD-10-CM | POA: Diagnosis present

## 2015-09-09 DIAGNOSIS — D6862 Lupus anticoagulant syndrome: Secondary | ICD-10-CM | POA: Diagnosis not present

## 2015-09-09 DIAGNOSIS — J80 Acute respiratory distress syndrome: Secondary | ICD-10-CM | POA: Diagnosis not present

## 2015-09-09 DIAGNOSIS — J9601 Acute respiratory failure with hypoxia: Secondary | ICD-10-CM | POA: Diagnosis present

## 2015-09-09 DIAGNOSIS — Z9889 Other specified postprocedural states: Secondary | ICD-10-CM

## 2015-09-09 DIAGNOSIS — I4892 Unspecified atrial flutter: Secondary | ICD-10-CM | POA: Diagnosis present

## 2015-09-09 DIAGNOSIS — Z7901 Long term (current) use of anticoagulants: Secondary | ICD-10-CM

## 2015-09-09 DIAGNOSIS — Z951 Presence of aortocoronary bypass graft: Secondary | ICD-10-CM

## 2015-09-09 DIAGNOSIS — I509 Heart failure, unspecified: Secondary | ICD-10-CM | POA: Diagnosis not present

## 2015-09-09 DIAGNOSIS — Z79899 Other long term (current) drug therapy: Secondary | ICD-10-CM

## 2015-09-09 DIAGNOSIS — N179 Acute kidney failure, unspecified: Secondary | ICD-10-CM | POA: Diagnosis not present

## 2015-09-09 DIAGNOSIS — M353 Polymyalgia rheumatica: Secondary | ICD-10-CM | POA: Diagnosis present

## 2015-09-09 DIAGNOSIS — R5382 Chronic fatigue, unspecified: Secondary | ICD-10-CM | POA: Diagnosis not present

## 2015-09-09 DIAGNOSIS — M1990 Unspecified osteoarthritis, unspecified site: Secondary | ICD-10-CM | POA: Diagnosis not present

## 2015-09-09 DIAGNOSIS — I480 Paroxysmal atrial fibrillation: Secondary | ICD-10-CM | POA: Diagnosis present

## 2015-09-09 DIAGNOSIS — R760 Raised antibody titer: Secondary | ICD-10-CM | POA: Diagnosis present

## 2015-09-09 DIAGNOSIS — R059 Cough, unspecified: Secondary | ICD-10-CM | POA: Diagnosis present

## 2015-09-09 DIAGNOSIS — R0602 Shortness of breath: Secondary | ICD-10-CM

## 2015-09-09 DIAGNOSIS — I110 Hypertensive heart disease with heart failure: Principal | ICD-10-CM | POA: Diagnosis present

## 2015-09-09 DIAGNOSIS — R0600 Dyspnea, unspecified: Secondary | ICD-10-CM | POA: Diagnosis not present

## 2015-09-09 DIAGNOSIS — J9691 Respiratory failure, unspecified with hypoxia: Secondary | ICD-10-CM | POA: Diagnosis present

## 2015-09-09 DIAGNOSIS — Z8679 Personal history of other diseases of the circulatory system: Secondary | ICD-10-CM

## 2015-09-09 DIAGNOSIS — I2510 Atherosclerotic heart disease of native coronary artery without angina pectoris: Secondary | ICD-10-CM | POA: Diagnosis present

## 2015-09-09 DIAGNOSIS — Z86711 Personal history of pulmonary embolism: Secondary | ICD-10-CM

## 2015-09-09 HISTORY — DX: Acute respiratory failure with hypoxia: J96.01

## 2015-09-09 LAB — COMPREHENSIVE METABOLIC PANEL
ALBUMIN: 3.5 g/dL (ref 3.5–5.0)
ALK PHOS: 48 U/L (ref 38–126)
ALT: 28 U/L (ref 17–63)
AST: 22 U/L (ref 15–41)
Anion gap: 8 (ref 5–15)
BUN: 30 mg/dL — AB (ref 6–20)
CALCIUM: 9.2 mg/dL (ref 8.9–10.3)
CO2: 26 mmol/L (ref 22–32)
Chloride: 107 mmol/L (ref 101–111)
Creatinine, Ser: 1.49 mg/dL — ABNORMAL HIGH (ref 0.61–1.24)
GFR calc Af Amer: 54 mL/min — ABNORMAL LOW (ref >60)
GFR calc non Af Amer: 47 mL/min — ABNORMAL LOW (ref >60)
GLUCOSE: 126 mg/dL — AB (ref 65–99)
Potassium: 4.3 mmol/L (ref 3.5–5.1)
SODIUM: 141 mmol/L (ref 135–145)
Total Bilirubin: 0.7 mg/dL (ref 0.3–1.2)
Total Protein: 7.2 g/dL (ref 6.5–8.1)

## 2015-09-09 LAB — ECHOCARDIOGRAM COMPLETE
Height: 69 in
Weight: 2960 oz

## 2015-09-09 LAB — CBC WITH DIFFERENTIAL/PLATELET
BASOS ABS: 0 10*3/uL (ref 0.0–0.1)
BASOS PCT: 0 %
Eosinophils Absolute: 0.1 10*3/uL (ref 0.0–0.7)
Eosinophils Relative: 0 %
HEMATOCRIT: 39.2 % (ref 39.0–52.0)
HEMOGLOBIN: 13.2 g/dL (ref 13.0–17.0)
LYMPHS PCT: 12 %
Lymphs Abs: 1.5 10*3/uL (ref 0.7–4.0)
MCH: 30.8 pg (ref 26.0–34.0)
MCHC: 33.7 g/dL (ref 30.0–36.0)
MCV: 91.4 fL (ref 78.0–100.0)
MONO ABS: 1.3 10*3/uL — AB (ref 0.1–1.0)
Monocytes Relative: 10 %
NEUTROS ABS: 9.8 10*3/uL — AB (ref 1.7–7.7)
NEUTROS PCT: 78 %
Platelets: 331 10*3/uL (ref 150–400)
RBC: 4.29 MIL/uL (ref 4.22–5.81)
RDW: 14.9 % (ref 11.5–15.5)
WBC: 12.6 10*3/uL — AB (ref 4.0–10.5)

## 2015-09-09 LAB — MRSA PCR SCREENING: MRSA BY PCR: NEGATIVE

## 2015-09-09 LAB — BRAIN NATRIURETIC PEPTIDE: B Natriuretic Peptide: 562 pg/mL — ABNORMAL HIGH (ref 0.0–100.0)

## 2015-09-09 LAB — LACTIC ACID, PLASMA: Lactic Acid, Venous: 1.3 mmol/L (ref 0.5–2.0)

## 2015-09-09 SURGERY — CANCELLED PROCEDURE

## 2015-09-09 MED ORDER — ACETAMINOPHEN 325 MG PO TABS
650.0000 mg | ORAL_TABLET | ORAL | Status: DC | PRN
  Administered 2015-09-10 – 2015-09-11 (×2): 650 mg via ORAL
  Filled 2015-09-09 (×2): qty 2

## 2015-09-09 MED ORDER — SODIUM CHLORIDE 0.9% FLUSH: 3.0000 mL | Freq: Two times a day (BID) | INTRAVENOUS | Status: DC

## 2015-09-09 MED ORDER — ONDANSETRON HCL 4 MG/2ML IJ SOLN: 4.0000 mg | Freq: Four times a day (QID) | INTRAMUSCULAR | Status: DC | PRN

## 2015-09-09 MED ORDER — AMLODIPINE BESYLATE 10 MG PO TABS
10.0000 mg | ORAL_TABLET | Freq: Every day | ORAL | Status: DC
  Administered 2015-09-10 – 2015-09-12 (×3): 10 mg via ORAL
  Filled 2015-09-09 (×3): qty 1

## 2015-09-09 MED ORDER — SODIUM CHLORIDE 0.9 % IV SOLN
INTRAVENOUS | Status: DC
Start: 2015-09-09 — End: 2015-09-09

## 2015-09-09 MED ORDER — ATORVASTATIN CALCIUM 20 MG PO TABS
20.0000 mg | ORAL_TABLET | Freq: Every day | ORAL | Status: DC
  Administered 2015-09-10 – 2015-09-11 (×2): 20 mg via ORAL
  Filled 2015-09-09 (×2): qty 1

## 2015-09-09 MED ORDER — CETYLPYRIDINIUM CHLORIDE 0.05 % MT LIQD: 7.0000 mL | Freq: Two times a day (BID) | OROMUCOSAL | Status: DC

## 2015-09-09 MED ORDER — METOPROLOL SUCCINATE ER 25 MG PO TB24
25.0000 mg | ORAL_TABLET | Freq: Every day | ORAL | Status: DC
  Administered 2015-09-10 – 2015-09-12 (×3): 25 mg via ORAL
  Filled 2015-09-09 (×3): qty 1

## 2015-09-09 MED ORDER — METOPROLOL SUCCINATE ER 25 MG PO TB24: 25.0000 mg | ORAL_TABLET | Freq: Every day | ORAL | Status: DC

## 2015-09-09 MED ORDER — FUROSEMIDE 10 MG/ML IJ SOLN
INTRAMUSCULAR | Status: AC
  Administered 2015-09-09: 40 mg
  Filled 2015-09-09: qty 4

## 2015-09-09 MED ORDER — CHLORHEXIDINE GLUCONATE 0.12 % MT SOLN
15.0000 mL | Freq: Two times a day (BID) | OROMUCOSAL | Status: DC
  Administered 2015-09-10 – 2015-09-11 (×2): 15 mL via OROMUCOSAL

## 2015-09-09 MED ORDER — SODIUM CHLORIDE 0.9 % IV SOLN: 250.0000 mL | INTRAVENOUS | Status: DC

## 2015-09-09 MED ORDER — DIAZEPAM 5 MG PO TABS: 10.0000 mg | ORAL_TABLET | Freq: Four times a day (QID) | ORAL | Status: DC | PRN

## 2015-09-09 MED ORDER — RIVAROXABAN 20 MG PO TABS
20.0000 mg | ORAL_TABLET | Freq: Every day | ORAL | Status: DC
  Administered 2015-09-10 – 2015-09-11 (×2): 20 mg via ORAL
  Filled 2015-09-09 (×2): qty 1

## 2015-09-09 MED ORDER — SODIUM CHLORIDE 0.9% FLUSH: 3.0000 mL | INTRAVENOUS | Status: DC | PRN

## 2015-09-09 MED ORDER — NITROGLYCERIN 2 % TD OINT
0.5000 [in_us] | TOPICAL_OINTMENT | Freq: Four times a day (QID) | TRANSDERMAL | Status: DC
  Administered 2015-09-09 – 2015-09-11 (×7): 0.5 [in_us] via TOPICAL
  Filled 2015-09-09: qty 30

## 2015-09-09 MED ORDER — AMLODIPINE BESYLATE 10 MG PO TABS: 10.0000 mg | ORAL_TABLET | Freq: Every day | ORAL | Status: DC

## 2015-09-09 MED ORDER — ATORVASTATIN CALCIUM 20 MG PO TABS: 20.0000 mg | ORAL_TABLET | Freq: Every day | ORAL | Status: DC

## 2015-09-09 MED ORDER — SODIUM CHLORIDE 0.9 % IV SOLN
250.0000 mL | INTRAVENOUS | Status: DC | PRN
  Administered 2015-09-11: 13:00:00 via INTRAVENOUS

## 2015-09-09 MED ORDER — ASPIRIN EC 81 MG PO TBEC
81.0000 mg | DELAYED_RELEASE_TABLET | Freq: Every day | ORAL | Status: DC
  Administered 2015-09-10 – 2015-09-12 (×3): 81 mg via ORAL
  Filled 2015-09-09 (×4): qty 1

## 2015-09-09 MED ORDER — SODIUM CHLORIDE 0.9% FLUSH
3.0000 mL | Freq: Two times a day (BID) | INTRAVENOUS | Status: DC
  Administered 2015-09-10 – 2015-09-11 (×4): 3 mL via INTRAVENOUS

## 2015-09-09 MED ORDER — TEMAZEPAM 30 MG PO CAPS: 30.0000 mg | ORAL_CAPSULE | Freq: Every day | ORAL | Status: DC

## 2015-09-09 MED ORDER — FUROSEMIDE 10 MG/ML IJ SOLN: 40.0000 mg | Freq: Once | INTRAMUSCULAR | Status: DC

## 2015-09-09 MED ORDER — FUROSEMIDE 10 MG/ML IJ SOLN
40.0000 mg | Freq: Two times a day (BID) | INTRAMUSCULAR | Status: DC
  Administered 2015-09-09: 40 mg via INTRAVENOUS
  Filled 2015-09-09: qty 4

## 2015-09-09 NOTE — Anesthesia Preprocedure Evaluation (Signed)
Anesthesia Evaluation  Patient identified by MRN, date of birth, ID band Patient awake    Airway Mallampati: II  TM Distance: >3 FB Neck ROM: Full    Dental   Pulmonary           Cardiovascular hypertension, Pt. on medications and Pt. on home beta blockers + CAD       Neuro/Psych    GI/Hepatic   Endo/Other    Renal/GU      Musculoskeletal   Abdominal   Peds  Hematology   Anesthesia Other Findings   Reproductive/Obstetrics                             Anesthesia Physical Anesthesia Plan  ASA: III  Anesthesia Plan: General   Post-op Pain Management:    Induction: Intravenous  Airway Management Planned: Mask  Additional Equipment:   Intra-op Plan:   Post-operative Plan: Extubation in OR  Informed Consent:   Plan Discussed with: CRNA, Anesthesiologist and Surgeon  Anesthesia Plan Comments:         Anesthesia Quick Evaluation

## 2015-09-09 NOTE — Progress Notes (Addendum)
Checked on pt Still on BiPAP, sats drop to 86% without it. Good UOP after IV Lasix Continue current care. Hopefully will be able to come off BiPAP soon. Diastolic BP very high, add nitrates.  Rosaria Ferries, Hershal Coria 09/09/2015 4:11 PM Beeper [phone]

## 2015-09-09 NOTE — H&P (Signed)
     INTERVAL PROCEDURE H&P  History and Physical Interval Note:  09/09/2015 12:25 PM  Peter Bruce has presented today for their planned procedure. The various methods of treatment have been discussed with the patient and family. After consideration of risks, benefits and other options for treatment, the patient has consented to the procedure.  The patients' outpatient history has been reviewed, patient examined, and no change in status from most recent office note within the past 30 days. I have reviewed the patients' chart and labs and will proceed as planned. Questions were answered to the patient's satisfaction.   Pixie Casino, MD, [facility] Attending Cardiologist [place] Burhanuddin Kohlmann 09/09/2015, 12:25 PM

## 2015-09-09 NOTE — Significant Event (Signed)
Rapid Response Event Note  Overview: RRT Team page to short stay for resp distress Time Called: 1215 Arrival Time: 1218 Event Type: Respiratory  Initial Focused Assessment:  On arrival patient supine in bed - alert - moderate resp distress - oriented and able to speak - denies CP - endorses SOB - bil crackles noted - skin warm and dry - afib on monitor with multifocal PVC's noted.  RN Jinny Blossom reports patient to unit as outpatient for a scheduled cardioversion - prior to procedure patient to bathroom - had SOB with decreased O2 sats requiring O2 to level of NRB mask - O2 sats remain low at 85 to 90%.  RR 32 with use of accessory muscles.  Wife reports he has not felt well in 2 days.   Interventions:  Stat PCXR.  Lasix 40 mg IV.  ABG stat. Stat CBC, BMP, BNP and lactic acid drawn and sent to lab.   Dr. Debara Pickett at bedside - request for ICU bed.  Patient remains with increased RR - and low sats despite NRB mask.  Bipap order - machine to ICU.Marland Kitchen Stat transfer to 2H11 - placed on Bipap with some WOB relief - O2 sats increasing to 98% - Dr. Debara Pickett aware of ABG - venous results - pH 7.43.  Handoff to [employer].  Wife updated and to bedside.     Event Summary: Name of Physician Notified: Dr. Debara Pickett at  (present when called)    at    Outcome: Transferred (Comment)  Event End Time:  (1315)  Quin Hoop

## 2015-09-09 NOTE — Progress Notes (Signed)
Patient and his wife came to endoscopy unit for an outpatient cardioversion. He went to the restroom and came back and as I was hooking him up to the monitors he seemed slightly short of breath. I noticed that his oxygen saturations on room air were only 74%. I changed placement of the probe and sat his HOB up and it only increased to 78%. Oxygen was applied via [place] and then to a mask. After a couple of minutes a non rebreather mask was applied and the patients O2 saturations only increased to 90% on 15 liters. At this point another nurse was in the room with me and we decided to call a rapid response after the 3 lead showed several runs of VT and the patient seemed to  pulling for air. The MD was in the room giving orders and the other RN started IVs and drew blood for the lab orders. When the rapid response team arrived we gave them a short report and immediately they called for an ICU bed. We then took the pt to [place] and gave a bedside report to the RN.

## 2015-09-09 NOTE — Progress Notes (Signed)
  Echocardiogram 2D Echocardiogram has been performed.  Darlina Sicilian M 09/09/2015, 2:56 PM

## 2015-09-09 NOTE — Progress Notes (Signed)
Patient has taken off his BIPAP. RT has placed patient on NRB at this time. Patient is tolerating sating 95 to 96%. Bipap is not needed at this time. RT will continue to monitor.

## 2015-09-09 NOTE — Progress Notes (Signed)
Called back to the bedside after rapid response initiated. Acute respiratory decompensation - SPo2 is 90% on NRB. He is tachypneic and using accessory muscles to breath. STAT CXR demonstrates pulmonary edema. Will give IV lasix now, place foley. Sent venous lactate in addition to earlier labs. Check STAT ABG. Will transfer to [facility] ICU for stabilization. Monitor respiratory status closely - may need BIPAP or intubation in the near future.  Pixie Casino, MD, [facility] Attending Cardiologist [place]

## 2015-09-09 NOTE — H&P (Signed)
ADMISSION HISTORY & PHYSICAL   Chief Complaint:  Dyspnea, fatigue, cough  Cardiologist: Dr. Martinique  Primary Care Physician: [name], MD  HPI:  This is a 65 y.o. male with a past medical history significant for history of PE on Xarelto in the past, hypertension, dyslipidemia, PMR, lupus anticoagulant positivity and kidney stones. In December 2016, he was noted to have worsening fatigue and weakness. He denied shortness of breath, but was found to be in rate-controlled a-fib. He had an echo which showed EF of 65-70%. A myoview was performed which was abnormal suggesting inferior and inferoapical as well as anteroseptal ischemia. 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 Subsequently, he underwent 3 vessel CABG with LIMA to LAD, SVG to RCA and SVG to OM, with MAZE and LAA closure. Post-operative course was uneventful, however, he had persistent atypical atrial flutter. He was seen by Dr. Martinique on 08/25/2015 and he recommended cardioversion, however, since the patient was asymptomatic, he declined. Telephone notes indicate his wife called our office to explain he was seen in an urgent care and had symptoms concerning for pneumonia. He apparently had a CXR there and was given Rochephin and Azithromycin. The CXR report apparently "was unchanged from Jan, per Dr. Martinique", although I do not have the film to review personally. He was started on lasix and presents today for elective cardioversion. On presentation, he was dyspneic, pale and hypoxic with RA saturation of 80%, that came up to 90% on 10L oxygen. He reports fatigue and progressive dyspnea, but denies fevers, chills or sweats. His wife has noted a marked decline in his functional status over the past week.  PMHx:  Past Medical History  Diagnosis Date  . Hypertension   . Arthritis   . Neuromuscular disorder ([place])   . PMR (polymyalgia rheumatica) (HCC)   . Acute pulmonary embolus ([place])  2010  . Hypercholesterolemia   . Atrial fibrillation ([place])   . Coronary artery disease involving native coronary artery 05/15/2015    multivessel  . Lupus anticoagulant positive 07/29/2008  . History of kidney stones   . 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 S/P Maze operation for atrial fibrillation 05/29/2015    Complete bilateral atrial lesion set via median sternotomy using bipolar radiofrequency and cryothermy ablation with clipping of LA appendage  . Atrial flutter [facility])     Past Surgical History  Procedure Laterality Date  . Appendectomy    . Hernia repair    . Shoulder surgery Left     clavicular fracture  . Arm surgery  Right     wrist and elbow  . Cardiac catheterization N/A 05/15/2015    Procedure: Left Heart Cath and Coronary Angiography;  Surgeon: Peter M Martinique, MD;  Location: [place] [facility];  Service: Cardiovascular;  Laterality: N/A;  . Coronary artery bypass graft N/A 05/29/2015    Procedure: CORONARY ARTERY BYPASS GRAFTING (CABG), ON PUMP, TIMES THREE, USING LEFT INTERNAL MAMMARY ARTERY, BILATERAL GREATER SAPHENOUS VEINS HARVESTED ENDOSCOPICALLY;  Surgeon: Rexene Alberts, MD;  Location: [place];  Service: Open Heart Surgery;  Laterality: N/A;  . Maze N/A 05/29/2015    Procedure: MAZE;  Surgeon: Rexene Alberts, MD;  Location: [place];  Service: Open Heart Surgery;  Laterality: N/A;  Complete Bi-Atrial Lesion set with Ablation and Cryothermy  . Tee without cardioversion  N/A 05/29/2015    Procedure: TRANSESOPHAGEAL ECHOCARDIOGRAM (TEE);  Surgeon: Rexene Alberts, MD;  Location: [place];  Service: Open Heart Surgery;  Laterality: N/A;  . Clipping of atrial appendage  05/29/2015    Procedure: CLIPPING OF LEFT ATRIAL APPENDAGE;  Surgeon: Rexene Alberts, MD;  Location: [facility];  Service: Open Heart Surgery;;  45 AtriClip PRO 145    FAMHx:  Family History  Problem Relation Age of Onset  . Cancer Mother   .  Other Sister     chf    SOCHx:   reports that he has never smoked. He has never used smokeless tobacco. He reports that he does not drink alcohol or use illicit drugs.  ALLERGIES:  Allergies  Allergen Reactions  . Amitriptyline Shortness Of Breath  . 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 Other Nausea And Vomiting    SSRI uptake: makes him extremely sick  . Serotonin Reuptake Inhibitors (Ssris) Nausea And Vomiting    SSRI uptake makes him extremely sick.    ROS: Pertinent items noted in HPI and remainder of comprehensive ROS otherwise negative.  HOME MEDS:   Medication List    ASK your doctor about these medications        amLODipine 10 MG tablet  Commonly known as:  NORVASC  Take 1.5 tablets (15 mg total) by mouth daily.     atorvastatin 20 MG tablet  Commonly known as:  LIPITOR  Take 1 tablet (20 mg total) by mouth daily.     diazepam 10 MG tablet  Commonly known as:  VALIUM  TAKE 1 TABLET EVERY 6 HOURS AS NEEDED FOR ANXIETY.     hydrOXYzine 50 MG tablet  Commonly known as:  ATARAX/VISTARIL  Take one at 3 am if wakes early     metoprolol succinate 25 MG 24 hr tablet  Commonly known as:  TOPROL-XL  Take 1 tablet (25 mg total) by mouth daily.     rivaroxaban 20 MG Tabs tablet  Commonly known as:  XARELTO  Take 1 tablet (20 mg total) by mouth daily with supper.     sildenafil 20 MG tablet  Commonly known as:  REVATIO  Take 5 tablets by mouth daily as needed for ED.     temazepam 30 MG capsule  Commonly known as:  RESTORIL  1 at 7pm to prepare for bed     zolpidem 10 MG tablet  Commonly known as:  AMBIEN  Take 1 tablet (10 mg total) by mouth at bedtime.        LABS/IMAGING: Results for orders placed or performed during the hospital encounter of 09/09/15 (from the past 48 hour(s))  CBC with Differential/Platelet     Status: Abnormal   Collection Time: 09/09/15 12:40 PM  Result Value Ref Range   WBC  12.6 (H) 4.0 - 10.5 K/uL   RBC 4.29 4.22 - 5.81 MIL/uL   Hemoglobin 13.2 13.0 - 17.0 g/dL   HCT 39.2 39.0 - 52.0 %   MCV 91.4 78.0 - 100.0 fL   MCH 30.8 26.0 - 34.0 pg   MCHC 33.7 30.0 - 36.0 g/dL   RDW 14.9 11.5 - 15.5 %   Platelets 331 150 - 400 K/uL   Neutrophils Relative % 78 %   Neutro Abs 9.8 (H) 1.7 - 7.7 K/uL   Lymphocytes Relative 12 %   Lymphs Abs 1.5 0.7 - 4.0 K/uL   Monocytes Relative  10 %   Monocytes Absolute 1.3 (H) 0.1 - 1.0 K/uL   Eosinophils Relative 0 %   Eosinophils Absolute 0.1 0.0 - 0.7 K/uL   Basophils Relative 0 %   Basophils Absolute 0.0 0.0 - 0.1 K/uL   No results found.  VITALS: Filed Vitals:   09/09/15 1223  BP: 156/96  Pulse: 97  Temp: 98.1 F (36.7 C)  Resp: 22    EXAM: General appearance: alert, moderate distress, pale and dyspneic Neck: JVD - 3 cm above sternal notch and no carotid bruit Lungs: diminished breath sounds LLL and dullness to percussion LLL Heart: irregularly irregular rhythm Abdomen: soft, non-tender; bowel sounds normal; no masses,  no organomegaly Extremities: edema trace pedal Pulses: 2+ and symmetric Skin: pale, warm, dry Neurologic: Mental status: Alert, oriented, thought content appropriate, quiet Psych: Flat affect, appears in distress  IMPRESSION: Principal Problem:   Respiratory failure with hypoxia (HCC) Active Problems:   Cough   Atrial fibrillation, controlled (HCC)   Fatigue   Lupus anticoagulant positive   S/P CABG x 3 and maze procedure   S/P Maze operation for atrial fibrillation   PLAN: 1. Mr. Bohanan presents with respiratory failure and hypoxia. DDx includes CHF, pleural effusion, pericardial effusion, pneumonia, and possibly anemia. I do not feel the stable atrial fibrillation that he has had for a few months has lead to his recent decompensation. I'm recommending admission for treatment of his hypoxia prior to considering cardioversion. Will obtain CBC with diff, CMET and BNP. Check PA/LAT CXR,  limited 2D echo for LV function and to r/o pericardial effusion. May need additional antibiotics if findings consistent with pneumonia. Will ask medicine service to evaluate and help manage as well. Reasonable to continue Xarelto unless procedures are expected to allow for possible cardioversion as respiratory status improves.  Pixie Casino, MD, [facility] Attending Cardiologist [place] 09/09/2015, 12:56 PM

## 2015-09-10 ENCOUNTER — Telehealth: Payer: Self-pay | Admitting: Physician Assistant

## 2015-09-10 ENCOUNTER — Other Ambulatory Visit (HOSPITAL_COMMUNITY): Payer: Medicare Other

## 2015-09-10 LAB — BASIC METABOLIC PANEL WITH GFR
Anion gap: 12 (ref 5–15)
BUN: 26 mg/dL — ABNORMAL HIGH (ref 6–20)
CO2: 28 mmol/L (ref 22–32)
Calcium: 8.9 mg/dL (ref 8.9–10.3)
Chloride: 101 mmol/L (ref 101–111)
Creatinine, Ser: 1.49 mg/dL — ABNORMAL HIGH (ref 0.61–1.24)
GFR calc Af Amer: 54 mL/min — ABNORMAL LOW
GFR calc non Af Amer: 47 mL/min — ABNORMAL LOW
Glucose, Bld: 102 mg/dL — ABNORMAL HIGH (ref 65–99)
Potassium: 4.1 mmol/L (ref 3.5–5.1)
Sodium: 141 mmol/L (ref 135–145)

## 2015-09-10 MED ORDER — ZOLPIDEM TARTRATE 5 MG PO TABS: 5.0000 mg | ORAL_TABLET | Freq: Every evening | ORAL | Status: DC | PRN

## 2015-09-10 MED ORDER — FUROSEMIDE 10 MG/ML IJ SOLN
80.0000 mg | Freq: Two times a day (BID) | INTRAMUSCULAR | Status: DC
  Administered 2015-09-10 (×2): 80 mg via INTRAVENOUS
  Filled 2015-09-10 (×2): qty 8

## 2015-09-10 MED ORDER — SODIUM CHLORIDE 0.9% FLUSH
3.0000 mL | Freq: Two times a day (BID) | INTRAVENOUS | Status: DC
  Administered 2015-09-10 – 2015-09-11 (×3): 3 mL via INTRAVENOUS

## 2015-09-10 MED ORDER — TEMAZEPAM 15 MG PO CAPS
30.0000 mg | ORAL_CAPSULE | Freq: Every evening | ORAL | Status: DC | PRN
  Administered 2015-09-10 – 2015-09-11 (×2): 30 mg via ORAL
  Filled 2015-09-10 (×2): qty 2

## 2015-09-10 MED ORDER — SODIUM CHLORIDE 0.9 % IV SOLN: 250.0000 mL | INTRAVENOUS | Status: DC

## 2015-09-10 MED ORDER — SODIUM CHLORIDE 0.9% FLUSH: 3.0000 mL | INTRAVENOUS | Status: DC | PRN

## 2015-09-10 NOTE — Progress Notes (Signed)
    Subjective:  Denies CP; dyspnea improving.   Objective:  Filed Vitals:   09/10/15 0500 09/10/15 0514 09/10/15 0600 09/10/15 0700  BP: 138/88   130/105  Pulse: 55 104 44 64  Temp:      TempSrc:      Resp: 24 25 21 20   Height:      Weight:      SpO2: 100% 96% 87% 95%    Intake/Output from previous day:  Intake/Output Summary (Last 24 hours) at 09/10/15 0737 Last data filed at 09/10/15 0700  Gross per 24 hour  Intake    140 ml  Output   2000 ml  Net  -1860 ml    Physical Exam: Physical exam: Well-developed well-nourished in no acute distress.  Skin is warm and dry.  HEENT is normal.  Neck is supple.  Chest with diminished BS bases Cardiovascular exam is irregular Abdominal exam nontender or distended. No masses palpated. Extremities show no edema. neuro grossly intact    Lab Results: Basic Metabolic Panel:  Recent Labs  09/09/15 1240 09/10/15 0508  NA 141 141  K 4.3 4.1  CL 107 101  CO2 26 28  GLUCOSE 126* 102*  BUN 30* 26*  CREATININE 1.49* 1.49*  CALCIUM 9.2 8.9   CBC:  Recent Labs  09/08/15 1038 09/09/15 1240  WBC 10.8 12.6*  NEUTROABS 8532* 9.8*  HGB 12.9* 13.2  HCT 38.8 39.2  MCV 90.7 91.4  PLT 334 331     Assessment/Plan:  1 acute diastolic congestive heart failure-the patient presented with dyspnea and his chest x-ray shows pulmonary edema and pleural effusions. I'm not convinced [place] is all secondary to atrial flutter as this has been present since his bypass surgery. His pulmonary edema is new. Echocardiogram shows preserved LV function. We will plan to increase Lasix to 80 mg IV twice a day. Follow renal function closely. We will ultimately proceed with cardioversion after his CHF improves. He does not appear to have pneumonia clinically. He is not febrile and white blood cell count only minimally elevated. Mildly productive cough. 2 atrial flutter-this has been persistent and I am not convinced completely responsible for congestive  heart failure. Continue xarelto. We will plan to proceed with cardioversion later this week. Continue toprol. 3 Coronary artery disease-status post coronary artery bypass graft-continue aspirin and statin. 4 acute kidney disease-follow renal function closely with diuresis. 5 hypertension-continue present medications and adjust as needed. 6 lupus anticoagulant positive-continue anticoagulation. 7 hyperlipidemia-continue statin.  [name] 09/10/2015, 7:37 AM

## 2015-09-10 NOTE — Progress Notes (Signed)
Pt very irritable, PA Meng with cardiology at bedside and said okay to order breakfast tray.

## 2015-09-10 NOTE — Telephone Encounter (Signed)
Patient's wife called the after hour answering service as patient has been admitted yesterday for acute heart failure and afib. I have seen the patient, he is still fluid overloaded, I increased lasix to 80mg  BID. I do not see any upcoming procedure, I will order him breakfast.  Signed, Almyra Deforest PA Pager: [phone]

## 2015-09-10 NOTE — Progress Notes (Signed)
Patient currently on [facility] with sats of 98%. All vitals are stable and patient is in no distress at this time. Bipap is not needed at this time.

## 2015-09-10 NOTE — Progress Notes (Signed)
Patient takes a lot of insomia medication for sleep include high dose valium, restoril and ambien. Ambien and restoril held, but patient request anyway. Will order restorial with specific instruction not to give it within 5 hours of take valium.  Hilbert Corrigan PA Pager: [phone]

## 2015-09-11 ENCOUNTER — Encounter (HOSPITAL_COMMUNITY): Payer: Self-pay

## 2015-09-11 ENCOUNTER — Encounter (HOSPITAL_COMMUNITY)
Admission: RE | Disposition: A | Discharge status: Home / Self Care | Payer: Self-pay | Source: Ambulatory Visit | Attending: Internal Medicine

## 2015-09-11 ENCOUNTER — Inpatient Hospital Stay (HOSPITAL_COMMUNITY): Payer: Medicare Other | Admitting: Anesthesiology

## 2015-09-11 DIAGNOSIS — I4891 Unspecified atrial fibrillation: Secondary | ICD-10-CM

## 2015-09-11 HISTORY — PX: CARDIOVERSION: SHX1299

## 2015-09-11 LAB — BASIC METABOLIC PANEL
Anion gap: 13 (ref 5–15)
BUN: 28 mg/dL — ABNORMAL HIGH (ref 6–20)
CHLORIDE: 100 mmol/L — AB (ref 101–111)
CO2: 30 mmol/L (ref 22–32)
CREATININE: 1.66 mg/dL — AB (ref 0.61–1.24)
Calcium: 8.7 mg/dL — ABNORMAL LOW (ref 8.9–10.3)
GFR calc non Af Amer: 41 mL/min — ABNORMAL LOW (ref >60)
GFR, EST AFRICAN AMERICAN: 48 mL/min — AB (ref >60)
Glucose, Bld: 98 mg/dL (ref 65–99)
POTASSIUM: 3.3 mmol/L — AB (ref 3.5–5.1)
SODIUM: 143 mmol/L (ref 135–145)

## 2015-09-11 SURGERY — CARDIOVERSION
Anesthesia: Monitor Anesthesia Care

## 2015-09-11 MED ORDER — AMIODARONE HCL 200 MG PO TABS: 400.0000 mg | ORAL_TABLET | Freq: Two times a day (BID) | ORAL | Status: DC

## 2015-09-11 MED ORDER — FUROSEMIDE 10 MG/ML IJ SOLN
40.0000 mg | Freq: Two times a day (BID) | INTRAMUSCULAR | Status: DC
  Administered 2015-09-11 (×2): 40 mg via INTRAVENOUS
  Filled 2015-09-11 (×2): qty 4

## 2015-09-11 MED ORDER — PROPOFOL 10 MG/ML IV BOLUS
INTRAVENOUS | Status: DC | PRN
  Administered 2015-09-11: 20 mg via INTRAVENOUS
  Administered 2015-09-11: 50 mg via INTRAVENOUS

## 2015-09-11 MED ORDER — POTASSIUM CHLORIDE CRYS ER 20 MEQ PO TBCR
40.0000 meq | EXTENDED_RELEASE_TABLET | Freq: Once | ORAL | Status: AC
  Administered 2015-09-11: 40 meq via ORAL
  Filled 2015-09-11: qty 2

## 2015-09-11 MED ORDER — LIDOCAINE HCL (CARDIAC) 20 MG/ML IV SOLN
INTRAVENOUS | Status: DC | PRN
  Administered 2015-09-11: 60 mg via INTRAVENOUS

## 2015-09-11 NOTE — Progress Notes (Signed)
Patient is currently on room air with sats of 93%. Patient is in no distress and all vitals are stable. BIPAP is not needed at this time.Will continue to monitor.

## 2015-09-11 NOTE — Progress Notes (Addendum)
Electrical Cardioversion Procedure Note Peter Bruce [id number] [birth year]  Procedure: Electrical Cardioversion Indications:  Atrial Fibrillation  Procedure Details Consent: Risks of procedure as well as the alternatives and risks of each were explained to the (patient/caregiver).  Consent for procedure obtained. Time Out: Verified patient identification, verified procedure, site/side was marked, verified correct patient position, special equipment/implants available, medications/allergies/relevent history reviewed, required imaging and test results available.  Performed  Patient placed on cardiac monitor, pulse oximetry, supplemental oxygen as necessary.  Sedation given: Patient sedated by anesthesia with lidocaine 60 mg and diprovan 70 micrograms IV. Pacer pads placed anterior and posterior chest.  Cardioverted 1 time(s).  Cardioverted at 120J.  Evaluation Findings: Post procedure patient had transient sinus and then atrial flutter Complications: None Patient did tolerate procedure well.  Will load with amiodarone and if atrial arrhythmias persist, plan repeat DCCV; hopefully would only need amiodarone for 8-12 weeks given history of maze. Peter Bruce 09/11/2015, 1:36 PM   Addendum; fu ECG showed sinus with pacs; will hold on amiodarone for now. Peter Bruce

## 2015-09-11 NOTE — Transfer of Care (Signed)
Immediate Anesthesia Transfer of Care Note  Patient: Peter Bruce  Procedure(s) Performed: Procedure(s): CARDIOVERSION (N/A)  Patient Location: Endoscopy Unit  Anesthesia Type:MAC  Level of Consciousness: awake, alert , oriented and patient cooperative  Airway & Oxygen Therapy: Patient Spontanous Breathing and Patient connected to nasal cannula oxygen  Post-op Assessment: Report given to RN, Post -op Vital signs reviewed and stable and Patient moving all extremities  Post vital signs: Reviewed and stable  Last Vitals:  Filed Vitals:   09/11/15 1300 09/11/15 1307  BP:  153/109  Pulse: 61 128  Temp:  36.3 C  Resp: 20 23    Last Pain:  Filed Vitals:   09/11/15 1309  PainSc: 0-No pain      Patients Stated Pain Goal: 0 ([id number])  Complications: No apparent anesthesia complications

## 2015-09-11 NOTE — Progress Notes (Signed)
    Subjective:  Denies CP; dyspnea improving.   Objective:  Filed Vitals:   09/11/15 0400 09/11/15 0500 09/11/15 0600 09/11/15 0700  BP: 117/82 112/81 123/80 114/93  Pulse: 41 46 94 61  Temp:  97.9 F (36.6 C)  98 F (36.7 C)  TempSrc:  Oral  Oral  Resp: 21 19 38 22  Height:      Weight:  176 lb 9.4 oz (80.1 kg)    SpO2: 95% 96% 89% 97%    Intake/Output from previous day:  Intake/Output Summary (Last 24 hours) at 09/11/15 0758 Last data filed at 09/11/15 0700  Gross per 24 hour  Intake   1610 ml  Output   4390 ml  Net  -2780 ml    Physical Exam: Physical exam: Well-developed well-nourished in no acute distress.  Skin is warm and dry.  HEENT is normal.  Neck is supple.  Chest with diminished BS bases Cardiovascular exam is irregular Abdominal exam nontender or distended. No masses palpated. Extremities show no edema. neuro grossly intact    Lab Results: Basic Metabolic Panel:  Recent Labs  09/10/15 0508 09/11/15 0219  NA 141 143  K 4.1 3.3*  CL 101 100*  CO2 28 30  GLUCOSE 102* 98  BUN 26* 28*  CREATININE 1.49* 1.66*  CALCIUM 8.9 8.7*   CBC:  Recent Labs  09/08/15 1038 09/09/15 1240  WBC 10.8 12.6*  NEUTROABS 8532* 9.8*  HGB 12.9* 13.2  HCT 38.8 39.2  MCV 90.7 91.4  PLT 334 331     Assessment/Plan:  1 acute diastolic congestive heart failure-the patient presented with dyspnea and his chest x-ray showed pulmonary edema and pleural effusions. I'm not convinced this is all secondary to atrial flutter as this has been present since his bypass surgery. His pulmonary edema is new. Echocardiogram shows preserved LV function. Much improved this AM. Change lasix to 40 mg IV BID and follow renal function. He is agreeable to DCCV; will try and arrange for later today or in AM. Hopefully reestablishing sinus will help with CHF. 2 atrial flutter-this has been persistent and I am not convinced completely responsible for congestive heart failure but  reestablishing sinus may help long term. Continue xarelto. We will plan to proceed with cardioversion today or tomorrow. Continue toprol. 3 Coronary artery disease-status post coronary artery bypass graft-continue aspirin and statin. 4 acute kidney disease-follow renal function closely with diuresis. 5 hypertension-continue present medications and adjust as needed. 6 lupus anticoagulant positive-continue anticoagulation. 7 hyperlipidemia-continue statin.  Peter Bruce 09/11/2015, 7:58 AM

## 2015-09-11 NOTE — Anesthesia Postprocedure Evaluation (Signed)
Anesthesia Post Note  Patient: Md Gory  Procedure(s) Performed: Procedure(s) (LRB): CARDIOVERSION (N/A)  Patient location during evaluation: Endoscopy Anesthesia Type: MAC Level of consciousness: awake and alert and oriented Pain management: pain level controlled Vital Signs Assessment: post-procedure vital signs reviewed and stable Respiratory status: spontaneous breathing and respiratory function stable Cardiovascular status: blood pressure returned to baseline and stable Postop Assessment: no signs of nausea or vomiting Anesthetic complications: no    Last Vitals:  Filed Vitals:   09/11/15 1300 09/11/15 1307  BP:  153/109  Pulse: 61 128  Temp:  36.3 C  Resp: 20 23    Last Pain:  Filed Vitals:   09/11/15 1309  PainSc: 0-No pain                 Erikah Thumm, Arther Dames

## 2015-09-11 NOTE — Anesthesia Preprocedure Evaluation (Addendum)
Anesthesia Evaluation  Patient identified by MRN, date of birth, ID band Patient awake    Reviewed: Allergy & Precautions, NPO status , Patient's Chart, lab work & pertinent test results  History of Anesthesia Complications Negative for: history of anesthetic complications  Airway Mallampati: II  TM Distance: >3 FB Neck ROM: Full    Dental  (+) Teeth Intact, Dental Advisory Given   Pulmonary pneumonia, resolved, PE   Pulmonary exam normal breath sounds clear to auscultation       Cardiovascular Exercise Tolerance: Good hypertension, Pt. on medications + CAD, + CABG and +CHF  Normal cardiovascular exam+ dysrhythmias Atrial Fibrillation  Rhythm:Regular Rate:Normal  S/p CABG 1/17 Echo 09/09/15: Study Conclusions  - Left ventricle: The cavity size was normal. There was moderate focal basal hypertrophy of the septum with mild posterior wall hypertrophy. Systolic function was normal. The estimated ejection fraction was in the range of 50% to 55%. Wall motion was normal; there were no regional wall motion abnormalities. - Aortic valve: Transvalvular velocity was within the normal range. There was no stenosis. There was no regurgitation. - Mitral valve: Transvalvular velocity was within the normal range. There was no evidence for stenosis. There was moderate regurgitation directed posteriorly. - Left atrium: The atrium was moderately dilated. - Right ventricle: The cavity size was normal. Wall thickness was normal. Systolic function was normal. - Tricuspid valve: There was mild-moderate regurgitation. - Pulmonary arteries: Systolic pressure was severely increased. PA peak pressure: 67 mm Hg (S). - Inferior vena cava: The vessel was dilated. The respirophasic diameter changes were blunted (< 50%), consistent with elevated central venous pressure. - Pericardium, extracardiac: There was a left pleural effusion.   Neuro/Psych PSYCHIATRIC  DISORDERS Anxiety  Neuromuscular disease    GI/Hepatic negative GI ROS, Neg liver ROS,   Endo/Other  Lupus anticoagulant  Renal/GU Renal disease (AKI)     Musculoskeletal  (+) Arthritis , Osteoarthritis,    Abdominal   Peds  Hematology  (+) Blood dyscrasia, anemia ,   Anesthesia Other Findings Day of surgery medications reviewed with the patient.  Reproductive/Obstetrics                           Anesthesia Physical Anesthesia Plan  ASA: III  Anesthesia Plan: MAC   Post-op Pain Management:    Induction: Intravenous  Airway Management Planned: Nasal Cannula  Additional Equipment:   Intra-op Plan:   Post-operative Plan:   Informed Consent: I have reviewed the patients History and Physical, chart, labs and discussed the procedure including the risks, benefits and alternatives for the proposed anesthesia with the patient or authorized representative who has indicated his/her understanding and acceptance.   Dental advisory given  Plan Discussed with: CRNA and Anesthesiologist  Anesthesia Plan Comments: (Discussed risks/benefits/alternatives to MAC sedation including need for ventilatory support, hypotension, need for conversion to general anesthesia.  All patient questions answered.  Patient/guardian wishes to proceed.)        Anesthesia Quick Evaluation

## 2015-09-12 ENCOUNTER — Other Ambulatory Visit: Payer: Self-pay | Admitting: Physician Assistant

## 2015-09-12 ENCOUNTER — Encounter (HOSPITAL_COMMUNITY)
Admission: RE | Disposition: A | Discharge status: Home / Self Care | Payer: Self-pay | Source: Ambulatory Visit | Attending: Internal Medicine

## 2015-09-12 ENCOUNTER — Encounter (HOSPITAL_COMMUNITY): Payer: Self-pay | Admitting: Physician Assistant

## 2015-09-12 ENCOUNTER — Telehealth: Payer: Self-pay | Admitting: Cardiology

## 2015-09-12 DIAGNOSIS — N289 Disorder of kidney and ureter, unspecified: Secondary | ICD-10-CM

## 2015-09-12 DIAGNOSIS — E785 Hyperlipidemia, unspecified: Secondary | ICD-10-CM

## 2015-09-12 LAB — BASIC METABOLIC PANEL
Anion gap: 13 (ref 5–15)
BUN: 30 mg/dL — ABNORMAL HIGH (ref 6–20)
CHLORIDE: 100 mmol/L — AB (ref 101–111)
CO2: 29 mmol/L (ref 22–32)
Calcium: 8.6 mg/dL — ABNORMAL LOW (ref 8.9–10.3)
Creatinine, Ser: 1.49 mg/dL — ABNORMAL HIGH (ref 0.61–1.24)
GFR calc non Af Amer: 47 mL/min — ABNORMAL LOW (ref >60)
GFR, EST AFRICAN AMERICAN: 54 mL/min — AB (ref >60)
Glucose, Bld: 87 mg/dL (ref 65–99)
POTASSIUM: 3.8 mmol/L (ref 3.5–5.1)
SODIUM: 142 mmol/L (ref 135–145)

## 2015-09-12 SURGERY — CARDIOVERSION
Anesthesia: Monitor Anesthesia Care

## 2015-09-12 MED ORDER — ATORVASTATIN CALCIUM 80 MG PO TABS: 80.0000 mg | ORAL_TABLET | Freq: Every day | ORAL | Status: DC

## 2015-09-12 MED ORDER — AMLODIPINE BESYLATE 10 MG PO TABS: 10.0000 mg | ORAL_TABLET | Freq: Every day | ORAL | Status: DC

## 2015-09-12 MED ORDER — FUROSEMIDE 20 MG PO TABS: 20.0000 mg | ORAL_TABLET | Freq: Every day | ORAL | Status: DC

## 2015-09-12 NOTE — Telephone Encounter (Signed)
TOC per [facility]  5/12 @ 1030 w/ [facility]

## 2015-09-12 NOTE — Progress Notes (Signed)
DC IV and tele per order, DC instructions reviewed and signed by patient, no further questions from patient, patient [name] with belongings-cane, toiletry bag.  Rowe Pavy, RN

## 2015-09-12 NOTE — Telephone Encounter (Signed)
Patient discharge planned for 09/12/15 - first TOC attempt to be made on 09/15/15

## 2015-09-12 NOTE — Care Management Important Message (Signed)
Important Message  Patient Details  Name: Peter Bruce MRN: [id number] Date of Birth: 1950-08-27   Medicare Important Message Given:  Yes    Ashwath Lasch P Lionardo Haze 09/12/2015, 12:55 PM

## 2015-09-12 NOTE — Progress Notes (Signed)
Patient ambulated in hallway 150 feet, patient tolerated walk well, back in room with call bell in reach.   Rowe Pavy, RN

## 2015-09-12 NOTE — Progress Notes (Signed)
    Subjective:  Denies CP or dyspnea   Objective:  Filed Vitals:   09/12/15 0300 09/12/15 0400 09/12/15 0500 09/12/15 0600  BP: 136/84 135/113 111/80 114/74  Pulse: 87 87    Temp:  98 F (36.7 C)    TempSrc:  Oral    Resp:      Height:      Weight:    173 lb 9.6 oz (78.744 kg)  SpO2: 95% 94%      Intake/Output from previous day:  Intake/Output Summary (Last 24 hours) at 09/12/15 0729 Last data filed at 09/12/15 0600  Gross per 24 hour  Intake    480 ml  Output   2300 ml  Net  -1820 ml    Physical Exam: Physical exam: Well-developed well-nourished in no acute distress.  Skin is warm and dry.  HEENT is normal.  Neck is supple.  Chest CTA Cardiovascular exam is irregular Abdominal exam nontender or distended. No masses palpated. Extremities show no edema. neuro grossly intact    Lab Results: Basic Metabolic Panel:  Recent Labs  09/11/15 0219 09/12/15 0327  NA 143 142  K 3.3* 3.8  CL 100* 100*  CO2 30 29  GLUCOSE 98 87  BUN 28* 30*  CREATININE 1.66* 1.49*  CALCIUM 8.7* 8.6*   CBC:  Recent Labs  09/09/15 1240  WBC 12.6*  NEUTROABS 9.8*  HGB 13.2  HCT 39.2  MCV 91.4  PLT 331     Assessment/Plan:  1 acute diastolic congestive heart failure-the patient presented with dyspnea and his chest x-ray showed pulmonary edema and pleural effusions. Echocardiogram shows preserved LV function. Much improved this AM following diuresis and DCCV. Renal function slightly worse; hold lasix today and resume lasix 20 mg daily in AM. 2 atrial flutter-s/p DCCV; patient remains in sinus with pacs and pvcs on telemetry; hopefully he will hold sinus; if not could add amiodarone for short time. Continue xarelto. Continue toprol.  3 Coronary artery disease-status post coronary artery bypass graft-continue aspirin and statin. 4 acute kidney disease-decrease lasix to 20 mg daily at DC. 5 hypertension-continue present medications and adjust as needed. 6 lupus  anticoagulant positive-continue anticoagulation. 7 hyperlipidemia-given CAD, change lipitor to 80 mg daily; check lipids and liver in 4 weeks. Ambulate this AM; dc later today if stable; TOC appt 1 week; check bmet at that time. Fu Dr Martinique 8 weeks. > 30 min PA and physician time D2   [name] 09/12/2015, 7:29 AM

## 2015-09-12 NOTE — Discharge Instructions (Signed)
Hold lasix today, started lasix 20mg  on Saturday 09/13/2015    Information on my medicine - XARELTO (Rivaroxaban)  This medication education was reviewed with me or my healthcare representative as part of my discharge preparation.  The pharmacist that spoke with me during my hospital stay was:  Roma Schanz, [facility]  Why was Xarelto prescribed for you? Xarelto was prescribed for you to reduce the risk of a blood clot forming that can cause a stroke if you have a medical condition called atrial fibrillation (a type of irregular heartbeat).  What do you need to know about xarelto ? Take your Xarelto ONCE DAILY at the same time every day with your evening meal. If you have difficulty swallowing the tablet whole, you may crush it and mix in applesauce just prior to taking your dose.  Take Xarelto exactly as prescribed by your doctor and DO NOT stop taking Xarelto without talking to the doctor who prescribed the medication.  Stopping without other stroke prevention medication to take the place of Xarelto may increase your risk of developing a clot that causes a stroke.  Refill your prescription before you run out.  After discharge, you should have regular check-up appointments with your healthcare provider that is prescribing your Xarelto.  In the future your dose may need to be changed if your kidney function or weight changes by a significant amount.  What do you do if you miss a dose? If you are taking Xarelto ONCE DAILY and you miss a dose, take it as soon as you remember on the same day then continue your regularly scheduled once daily regimen the next day. Do not take two doses of Xarelto at the same time or on the same day.   Important Safety Information A possible side effect of Xarelto is bleeding. You should call your healthcare provider right away if you experience any of the following: ? Bleeding from an injury or your nose that does not stop. ? Unusual colored urine (red or  dark brown) or unusual colored stools (red or black). ? Unusual bruising for unknown reasons. ? A serious fall or if you hit your head (even if there is no bleeding).  Some medicines may interact with Xarelto and might increase your risk of bleeding while on Xarelto. To help avoid this, consult your healthcare provider or pharmacist prior to using any new prescription or non-prescription medications, including herbals, vitamins, non-steroidal anti-inflammatory drugs (NSAIDs) and supplements.  This website has more information on Xarelto: [email or web address]

## 2015-09-12 NOTE — Discharge Summary (Signed)
Discharge Summary    Patient ID: Peter Bruce,  MRN: [id number], DOB/AGE: [birth year] 65 y.o.  Admit date: 09/09/2015 Discharge date: 09/12/2015  Primary Care Provider: [name] Primary Cardiologist: Dr. Martinique  Discharge Diagnoses     Principal Problem:   Respiratory failure with hypoxia [facility]) Active Problems:   Cough   Atrial fibrillation, controlled (HCC)   Fatigue   Lupus anticoagulant positive   S/P CABG x 3 and maze procedure   S/P Maze operation for atrial fibrillation   Acute respiratory failure with hypoxia (HCC)   Allergies Allergies  Allergen Reactions  . Amitriptyline Shortness Of Breath  . 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 Other Nausea And Vomiting    SSRI uptake: makes him extremely sick  . Serotonin Reuptake Inhibitors (Ssris) Nausea And Vomiting    SSRI uptake makes him extremely sick.    Diagnostic Studies/Procedures    DCCV 09/11/2015 Electrical Cardioversion Procedure Note Peter Bruce [id number] 1951-01-20  Procedure: Electrical Cardioversion Indications: Atrial Fibrillation  Procedure Details Consent: Risks of procedure as well as the alternatives and risks of each were explained to the (patient/caregiver). Consent for procedure obtained. Time Out: Verified patient identification, verified procedure, site/side was marked, verified correct patient position, special equipment/implants available, medications/allergies/relevent history reviewed, required imaging and test results available. Performed  Patient placed on cardiac monitor, pulse oximetry, supplemental oxygen as necessary.  Sedation given: Patient sedated by anesthesia with lidocaine 60 mg and diprovan 70 micrograms IV. Pacer pads placed anterior and posterior chest.  Cardioverted 1 time(s).  Cardioverted at 120J.  Evaluation Findings: Post procedure patient had transient sinus and then atrial flutter Complications:  None Patient did tolerate procedure well.  Will load with amiodarone and if atrial arrhythmias persist, plan repeat DCCV; hopefully would only need amiodarone for 8-12 weeks given history of maze. Peter Bruce 09/11/2015, 1:36 PM   Addendum; fu ECG showed sinus with pacs; will hold on amiodarone for now. _____________   History of Present Illness     This is a 65 y.o. male with a past medical history significant for history of PE on Xarelto in the past, hypertension, dyslipidemia, PMR, lupus anticoagulant positivity and kidney stones. In December 2016, he was noted to have worsening fatigue and weakness. He denied shortness of breath, but was found to be in rate-controlled a-fib. He had an echo which showed EF of 65-70%. A myoview was performed which was abnormal suggesting inferior and inferoapical as well as anteroseptal ischemia. 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 Subsequently, he underwent 3 vessel CABG with LIMA to LAD, SVG to RCA and SVG to OM, with MAZE and LAA closure. Post-operative course was uneventful, however, he had persistent atypical atrial flutter. He was seen by Dr. Martinique on 08/25/2015 and he recommended cardioversion, however, since the patient was asymptomatic, he declined. Telephone notes indicate his wife called our office to explain he was seen in an urgent care and had symptoms concerning for pneumonia. He apparently had a CXR there and was given Rochephin and Azithromycin. The CXR report apparently "was unchanged from Jan, per Dr. Martinique", although I do not have the film to review personally. He was started on lasix and presents today for elective cardioversion. On presentation, he was dyspneic, pale and hypoxic with RA saturation of 80%, that came up to 90% on 10L oxygen. He reports fatigue and progressive dyspnea, but denies  fevers, chills or sweats. His wife has noted a marked decline in his functional status over the past  week.  Hospital Course      Given the hypoxia noted prior to cardioversion, his procedure was canceled. He was admitted directly under cardiology service for IV diuresis. On the day of arrival, he had acute respiratory compensation, O2 saturation was 90% on nonrebreather. Tachypneic and using accessory muscles to breathe. Stat chest x-ray demonstrated pulmonary edema. He was transferred to cardiac ICU and given IV Lasix. Echocardiogram obtained on 09/09/2015 showed EF 50-55%, no RWMA, moderate MR, mild-to-moderate TR, PA peak pressure 67 mmHg, left pleural effusion. It was not convincing that his acute heart failure was secondary to atrial flutter as his atrial flutter has been present since his bypass surgery. His volume status improved on IV diuresis. He eventually underwent successful DC cardioversion with conversion to normal sinus rhythm after single 120 J biphasic shock on 09/11/2015   He was kept overnight for observation. He was seen in the morning of 09/12/2015, at which time he denies significant shortness of breath. He is maintaining sinus rhythm on telemetry. Given his CAD, lipitor was changed to 80 mg daily. Otherwise he is deemed stable for discharge from cardiology perspective. I will arrange one week transition of care follow-up with basic metabolic panel on the same day.  1 month labwork to check lipid and liver function test given increased in Lipitor dose. Dr. Stanford Breed also recommended 8 weeks followup with Dr. Martinique, unfortunately Dr. Doug Sou earliest available appt is in August, an appt has been made for him. He is on the waiting list to be contacted if an earlier followup with Dr. Martinique becomes available. He does have some degree of acute kidney injury, we will hold his Lasix today and resume tomorrow 20 mg daily. If he has any recurrence of atrial fibrillation later, may consider amiodarone.  _____________  Discharge Vitals Blood pressure 114/74, pulse 87, temperature 98 F (36.7 C),  temperature source Oral, resp. rate 18, height 5\' 9"  (1.753 m), weight 173 lb 9.6 oz (78.744 kg), SpO2 94 %.  Filed Weights   09/10/15 0600 09/11/15 0500 09/12/15 0600  Weight: 184 lb 15.5 oz (83.9 kg) 176 lb 9.4 oz (80.1 kg) 173 lb 9.6 oz (78.744 kg)    Labs & Radiologic Studies     CBC  Recent Labs  09/09/15 1240  WBC 12.6*  NEUTROABS 9.8*  HGB 13.2  HCT 39.2  MCV 91.4  PLT [id number]   Basic Metabolic Panel  Recent Labs  09/11/15 0219 09/12/15 0327  NA 143 142  K 3.3* 3.8  CL 100* 100*  CO2 30 29  GLUCOSE 98 87  BUN 28* 30*  CREATININE 1.66* 1.49*  CALCIUM 8.7* 8.6*   Liver Function Tests  Recent Labs  09/09/15 1240  AST 22  ALT 28  ALKPHOS 48  BILITOT 0.7  PROT 7.2  ALBUMIN 3.5    Dg Chest Port 1 View  09/09/2015  CLINICAL DATA:  65 year old male with acute respiratory distress wall being prepped for cardioversion. Initial encounter. EXAM: PORTABLE CHEST 1 VIEW COMPARISON:  06/09/2015 and earlier. FINDINGS: Portable AP semi upright view at 1311 hours. Chronic cardiomegaly. Sequelae of CABG. Mediastinal contours appear stable. Increased pulmonary vascularity with confluent bibasilar opacity mostly obscuring the diaphragm. IMPRESSION: Suspected interstitial pulmonary edema with bilateral pleural effusions and bilateral lower lobe collapse or consolidation. Electronically Signed   By: Genevie Ann M.D.   On: 09/09/2015 13:25  Disposition   Pt is being discharged home today in good condition.  Follow-up Plans & Appointments    Follow-up Information    Follow up with Eileen Stanford, PA-C On 09/19/2015.   Specialties:  Cardiology, Radiology   Why:  10:30AM. Cardiology followup. BMET on the same day. Note Location is [employer], not [employer] information:   [address] [place] [id number] [phone]       Follow up with [facility] On 10/17/2015.   Specialty:  Cardiology   Why:  obtain fasting lipid  panel and liver function during normal lab hour from 8:00AM to 5:00PM. No food or drink at least 6 hours prior to the lab draw   Contact information:   [address] [place] [phone]      Follow up with Peter Martinique, MD On 12/12/2015.   Specialty:  Cardiology   Why:  9:30AM. Cardiology followup. Cardiology scheduler will contact you if an earlier appt is available with Dr. Martinique   Contact information:   [address] [place] [address] [phone]       Follow up with DOOLITTLE, Linton Ham, MD.   Specialties:  Internal Medicine, Adolescent Medicine   Why:  Please establish with a primary care physician   Contact information:   [address] [place] [id number] [phone]      Discharge Instructions    Diet - low sodium heart healthy    Complete by:  As directed      Increase activity slowly    Complete by:  As directed            Discharge Medications   Current Discharge Medication List    START taking these medications   Details  furosemide (LASIX) 20 MG tablet Take 1 tablet (20 mg total) by mouth daily. Qty: 90 tablet, Refills: 0      CONTINUE these medications which have CHANGED   Details  amLODipine (NORVASC) 10 MG tablet Take 1 tablet (10 mg total) by mouth daily. Qty: 90 tablet, Refills: 3    atorvastatin (LIPITOR) 80 MG tablet Take 1 tablet (80 mg total) by mouth daily at 6 PM. Qty: 90 tablet, Refills: 3      CONTINUE these medications which have NOT CHANGED   Details  diazepam (VALIUM) 10 MG tablet TAKE 1 TABLET EVERY 6 HOURS AS NEEDED FOR ANXIETY. Qty: 120 tablet, Refills: 5    hydrOXYzine (ATARAX/VISTARIL) 50 MG tablet Take one at 3 am if wakes early Qty: 30 tablet, Refills: 5    metoprolol succinate (TOPROL-XL) 25 MG 24 hr tablet Take 1 tablet (25 mg total) by mouth daily. Qty: 90 tablet, Refills: 3    rivaroxaban (XARELTO) 20 MG TABS tablet Take 1 tablet (20 mg total) by mouth  daily with supper. Qty: 30 tablet, Refills: 11    sildenafil (REVATIO) 20 MG tablet Take 5 tablets by mouth daily as needed for ED. Qty: 50 tablet, Refills: 11   Associated Diagnoses: Erectile dysfunction, unspecified erectile dysfunction type    temazepam (RESTORIL) 30 MG capsule 1 at 7pm to prepare for bed Qty: 30 capsule, Refills: 5   Associated Diagnoses: Insomnia    zolpidem (AMBIEN) 10 MG tablet Take 1 tablet (10 mg total) by mouth at bedtime. Qty: 30 tablet, Refills: 5   Associated Diagnoses: Insomnia           Outstanding Labs/Studies  Labs include BMET, LFT and Liver function test as noted above  Duration of Discharge Encounter   Greater than 30 minutes including physician time.  Signed, Almyra Deforest PA-C 09/12/2015, 9:51 AM

## 2015-09-15 NOTE — Telephone Encounter (Signed)
LMTCB

## 2015-09-16 ENCOUNTER — Telehealth: Payer: Self-pay | Admitting: Cardiology

## 2015-09-16 NOTE — Telephone Encounter (Signed)
Returned call to patient's wife.She stated husband needs a letter to excuse him form jury duty.Stated he is too weak to go.Advised.I will send message to Dr.Jordan okaying me to type letter.

## 2015-09-16 NOTE — Telephone Encounter (Signed)
Peter Bruce  Is asking that you give her a call  Please .Marland Kitchen Thanks

## 2015-09-17 NOTE — Telephone Encounter (Signed)
Left message on wife's voice mail letter to excuse Peter Bruce from jury duty left at front desk of [employer] office.

## 2015-09-18 NOTE — Progress Notes (Signed)
Cardiology Office Note    Date:  09/19/2015   ID:  Peter Bruce, DOB 1951/01/26, MRN [id number]  PCP:  [name], MD  Cardiologist:  Dr. Martinique   Post hospital follow up  History of Present Illness:  Peter Bruce is a 65 y.o. male with a past medical history significant for history of PE on Xarelto, HTN, HLD, PMR, lupus anticoagulant positivity, kidney stones, atrial fib on Xarelto, CAD s/p CABG +MAZE and LAA closure (05/2015), and recent admission for acute diastolic CHF who presents to clinic for post hospital follow up.   In 04/2015, he was noted to have worsening fatigue and weakness and found to be in rate-controlled a-fib. He had an echo which showed EF of 65-70%. A myoview was performed which was abnormal suggesting inferior and inferoapical as well as anteroseptal ischemia. 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 Subsequently, he underwent 3 vessel CABG with LIMA to LAD, SVG to RCA and SVG to OM, with MAZE and LAA closure on 05/29/15. Post-operative course was uneventful, however, he had persistent atypical atrial flutter.  He was seen by Dr. Martinique on 08/25/2015 and he recommended cardioversion, however, since the patient was asymptomatic, he declined. Telephone notes indicate his wife called our office to explain he was seen in an urgent care and had symptoms concerning for pneumonia. He apparently had a CXR there and was given Rochephin and Azithromycin. He was started on lasix and presented 09/17/15 for elective cardioversion.   On presentation for DCCV, he was dyspneic, pale and hypoxic with RA saturation of 80%, that came up to 90% on 10L oxygen. GIven the hypoxia noted prior to cardioversion, his procedure was canceled. Stat chest x-ray demonstrated pulmonary edema. He was transferred to cardiac ICU and given IV Lasix. Echocardiogram obtained on 09/09/2015 showed EF 50-55%, no RWMA, moderate MR, mild-to-moderate TR, PA peak pressure 67 mmHg,  left pleural effusion. It was not convincing that his acute heart failure was secondary to atrial flutter as his atrial flutter has been present since his bypass surgery. His volume status improved on IV diuresis. He eventually underwent successful DC cardioversion with conversion to normal sinus rhythm after single 120 J biphasic shock on 09/11/2015. If he has any recurrence of atrial fibrillation later, may consider amiodarone. He was discharged the following day in good condition. His lasix was held one day after discharge due to AKI (creat up to 1.49) and then he resumed lasix 20mg  daily.   Today he presents to clinic for follow up. He has been feeling very fatigued and weak and very. He also has dizziness and muscles aches with the statin. He gets so dizzy that he has run into things  This occurred with both Crestor and Lipitor. No CP or SOB. No LE edema, orthopnea or PND. No syncope. No blood in his stool or urine.    Past Medical History  Diagnosis Date  . Hypertension   . Arthritis   . Neuromuscular disorder ([place])   . PMR (polymyalgia rheumatica) (HCC)   . Acute pulmonary embolus ([place]) 2010  . Hypercholesterolemia   . Atrial fibrillation [facility])     s/p inpatient DCCV 09/11/2015  . Coronary artery disease involving native coronary artery 05/15/2015    multivessel  . Lupus anticoagulant positive 07/29/2008  . History of kidney stones   . S/P CABG x 3 05/29/2015    LIMA to LAD, SVG to OM2, SVG to RCA, EVH via bilateral thighs and  right lower leg  . S/P Maze operation for atrial fibrillation 05/29/2015    Complete bilateral atrial lesion set via median sternotomy using bipolar radiofrequency and cryothermy ablation with clipping of LA appendage  . Atrial flutter [facility])     Past Surgical History  Procedure Laterality Date  . Appendectomy    . Hernia repair    . Shoulder surgery Left     clavicular fracture  . Arm surgery  Right     wrist and elbow  . Cardiac catheterization N/A 05/15/2015     Procedure: Left Heart Cath and Coronary Angiography;  Surgeon: Peter M Martinique, MD;  Location: [place] [facility];  Service: Cardiovascular;  Laterality: N/A;  . Coronary artery bypass graft N/A 05/29/2015    Procedure: CORONARY ARTERY BYPASS GRAFTING (CABG), ON PUMP, TIMES THREE, USING LEFT INTERNAL MAMMARY ARTERY, BILATERAL GREATER SAPHENOUS VEINS HARVESTED ENDOSCOPICALLY;  Surgeon: Rexene Alberts, MD;  Location: [place];  Service: Open Heart Surgery;  Laterality: N/A;  . Maze N/A 05/29/2015    Procedure: MAZE;  Surgeon: Rexene Alberts, MD;  Location: [place];  Service: Open Heart Surgery;  Laterality: N/A;  Complete Bi-Atrial Lesion set with Ablation and Cryothermy  . Tee without cardioversion N/A 05/29/2015    Procedure: TRANSESOPHAGEAL ECHOCARDIOGRAM (TEE);  Surgeon: Rexene Alberts, MD;  Location: [place];  Service: Open Heart Surgery;  Laterality: N/A;  . Clipping of atrial appendage  05/29/2015    Procedure: CLIPPING OF LEFT ATRIAL APPENDAGE;  Surgeon: Rexene Alberts, MD;  Location: [place];  Service: Open Heart Surgery;;  45 AtriClip PRO 145  . Cardioversion N/A 09/11/2015    Procedure: CARDIOVERSION;  Surgeon: Lelon Perla, MD;  Location: [facility] ENDOSCOPY;  Service: Cardiovascular;  Laterality: N/A;    Current Medications: Outpatient Prescriptions Prior to Visit  Medication Sig Dispense Refill  . amLODipine (NORVASC) 10 MG tablet Take 1 tablet (10 mg total) by mouth daily. 90 tablet 3  . diazepam (VALIUM) 10 MG tablet TAKE 1 TABLET EVERY 6 HOURS AS NEEDED FOR ANXIETY. 120 tablet 5  . furosemide (LASIX) 20 MG tablet Take 1 tablet (20 mg total) by mouth daily. 90 tablet 0  . metoprolol succinate (TOPROL-XL) 25 MG 24 hr tablet Take 1 tablet (25 mg total) by mouth daily. 90 tablet 3  . rivaroxaban (XARELTO) 20 MG TABS tablet Take 1 tablet (20 mg total) by mouth daily with supper. 30 tablet 11  . sildenafil (REVATIO) 20 MG tablet Take 5 tablets by mouth daily as needed for ED. 50 tablet 11  .  temazepam (RESTORIL) 30 MG capsule 1 at 7pm to prepare for bed 30 capsule 5  . zolpidem (AMBIEN) 10 MG tablet Take 1 tablet (10 mg total) by mouth at bedtime. 30 tablet 5  . atorvastatin (LIPITOR) 80 MG tablet Take 1 tablet (80 mg total) by mouth daily at 6 PM. 90 tablet 3  . hydrOXYzine (ATARAX/VISTARIL) 50 MG tablet Take one at 3 am if wakes early (Patient taking differently: Take 50 mg by mouth at bedtime as needed for anxiety. Take one at 3 am if wakes early) 30 tablet 5   No facility-administered medications prior to visit.     Allergies:   Amitriptyline; Heparin; Other; and Serotonin reuptake inhibitors (ssris)   Social History   Social History  . Marital Status: Married    Spouse Name: N/A  . Number of Children: N/A  . Years of Education: N/A   Occupational History  . licensed  Chief Financial Officer     self-employed   Social History Main Topics  . Smoking status: Never Smoker   . Smokeless tobacco: Never Used  . Alcohol Use: No  . Drug Use: No  . Sexual Activity: Not Asked   Other Topics Concern  . None   Social History Narrative   Lives with his wife (married 1990).  Former Health visitor. Body building/weight lifting, running for exercise.     Family History:  The patient's family history includes Cancer in his mother; Other in his sister.   ROS:   Please see the history of present illness.    ROS All other systems reviewed and are negative.   PHYSICAL EXAM:   VS:  BP 98/70 mmHg  Pulse 90  Ht 5\' 9"  (1.753 m)  Wt 185 lb 3.2 oz (84.006 kg)  BMI 27.34 kg/m2   GEN: Well nourished, well developed, in no acute distress HEENT: normal Neck: no JVD, carotid bruits, or masses Cardiac: RRR; no murmurs, rubs, or gallops,no edema  Respiratory:  clear to auscultation bilaterally, normal work of breathing GI: soft, nontender, nondistended, + BS MS: no deformity or atrophy Skin: warm and dry, no rash Neuro:  Alert and Oriented x 3, Strength and sensation are  intact Psych: euthymic mood, full affect  Wt Readings from Last 3 Encounters:  09/19/15 185 lb 3.2 oz (84.006 kg)  09/12/15 173 lb 9.6 oz (78.744 kg)  08/25/15 188 lb (85.276 kg)      Studies/Labs Reviewed:   EKG:  EKG is ordered today.  The ekg ordered today demonstrates  NSR with PAC, prolonged QT  Recent Labs: 11/13/2014: TSH 1.505 05/30/2015: Magnesium 2.3 09/09/2015: ALT 28; B Natriuretic Peptide 562.0*; Hemoglobin 13.2; Platelets 331 09/12/2015: BUN 30*; Creatinine, Ser 1.49*; Potassium 3.8; Sodium 142   Lipid Panel    Component Value Date/Time   CHOL 203* 11/13/2014 1643   TRIG 117 11/13/2014 1643   HDL 54 11/13/2014 1643   CHOLHDL 3.8 11/13/2014 1643   VLDL 23 11/13/2014 1643   LDLCALC 126* 11/13/2014 1643    Additional studies/ records that were reviewed today include:   2D ECHO: 09/09/2015 LV EF: 50% - 55% Study Conclusions - Left ventricle: The cavity size was normal. There was moderate  focal basal hypertrophy of the septum with mild posterior wall  hypertrophy. Systolic function was normal. The estimated ejection  fraction was in the range of 50% to 55%. Wall motion was normal;  there were no regional wall motion abnormalities. - Aortic valve: Transvalvular velocity was within the normal range.  There was no stenosis. There was no regurgitation. - Mitral valve: Transvalvular velocity was within the normal range.  There was no evidence for stenosis. There was moderate  regurgitation directed posteriorly. - Left atrium: The atrium was moderately dilated. - Right ventricle: The cavity size was normal. Wall thickness was  normal. Systolic function was normal. - Tricuspid valve: There was mild-moderate regurgitation. - Pulmonary arteries: Systolic pressure was severely increased. PA  peak pressure: 67 mm Hg (S). - Inferior vena cava: The vessel was dilated. The respirophasic  diameter changes were blunted (< 50%), consistent with elevated  central  venous pressure. - Pericardium, extracardiac: There was a left pleural effusion.    ASSESSMENT:    1. Chronic diastolic CHF (congestive heart failure) ([place])   2. PAF (paroxysmal atrial fibrillation) ([place])   3. S/P CABG x 3 and maze procedure   4. Dyslipidemia   5. Essential hypertension   6.  History of pulmonary embolism   7. Lupus anticoagulant positive   8. Other fatigue      PLAN:  In order of problems listed above:  Chronic diastolic CHF: appears euvolemic today. Continue lasix 20mg  daily. BMET pending today   PAFib/flutter: s/p MAZE with CABG. Post op was c/b atrial flutter s/p sucessful DCCV on 09/11/15. ECG today with NSR. CHADSVASC score of at least 4 (CHF, HTN, age, CAD). Continue Xarelto.   CAD s/p CABG x3: No ASA due to DOAC use. Continue statin and BB.   HLD: He has had issues with dizziness and muscle aches with Lipitor and Crestor. Will trial pravastatin to see if he tolerates this. If not, we can send to lipid clinic  HTN: BP soft. 98/72. WIll decrease amlodipine 10mg  --> 5mg  daily.   Hx of PE: continue Xarelto  Lupus AC positive: continue Xarelto  Fatigue: recent CBC was normal, but will check again with a TSH. BP soft. As above, will lower amlodipine   Medication Adjustments/Labs and Tests Ordered: Current medicines are reviewed at length with the patient today.  Concerns regarding medicines are outlined above.  Medication changes, Labs and Tests ordered today are listed in the Patient Instructions below. Patient Instructions  Medication Instructions:  Your physician has recommended you make the following change in your medication:  1.  STOP the Lipitor 2.  START the Pravastatin 20 mg taking 1 tablet daily   Labwork: TODAY:  TSH, CBC, & BMET  Testing/Procedures: None ordered  Follow-Up: Your physician recommends that you schedule a follow-up appointment in: [place]   Any Other Special Instructions Will Be Listed Below  (If Applicable). You have been referred to [facility].  THEY WILL CONTACT YOU TO SET UP AN APPT.   If you need a refill on your cardiac medications before your next appointment, please call your pharmacy.       Signed, Angelena Form, PA-C  09/19/2015 12:50 PM    [facility] [place], [place], [place]  [address] Phone: [phone]; Fax: [phone]

## 2015-09-19 ENCOUNTER — Other Ambulatory Visit (INDEPENDENT_AMBULATORY_CARE_PROVIDER_SITE_OTHER): Payer: Medicare Other | Admitting: *Deleted

## 2015-09-19 ENCOUNTER — Encounter: Payer: Self-pay | Admitting: Physician Assistant

## 2015-09-19 ENCOUNTER — Telehealth (HOSPITAL_COMMUNITY): Payer: Self-pay | Admitting: Cardiac Rehabilitation

## 2015-09-19 ENCOUNTER — Other Ambulatory Visit (HOSPITAL_COMMUNITY): Payer: Self-pay | Admitting: Cardiac Rehabilitation

## 2015-09-19 ENCOUNTER — Ambulatory Visit (INDEPENDENT_AMBULATORY_CARE_PROVIDER_SITE_OTHER): Payer: Medicare Other | Admitting: Physician Assistant

## 2015-09-19 VITALS — BP 98/70 | HR 90 | Ht 69.0 in | Wt 185.2 lb

## 2015-09-19 DIAGNOSIS — E785 Hyperlipidemia, unspecified: Secondary | ICD-10-CM

## 2015-09-19 DIAGNOSIS — I480 Paroxysmal atrial fibrillation: Secondary | ICD-10-CM

## 2015-09-19 DIAGNOSIS — I5032 Chronic diastolic (congestive) heart failure: Secondary | ICD-10-CM | POA: Diagnosis not present

## 2015-09-19 DIAGNOSIS — Z951 Presence of aortocoronary bypass graft: Secondary | ICD-10-CM

## 2015-09-19 DIAGNOSIS — N289 Disorder of kidney and ureter, unspecified: Secondary | ICD-10-CM

## 2015-09-19 DIAGNOSIS — Z86711 Personal history of pulmonary embolism: Secondary | ICD-10-CM

## 2015-09-19 DIAGNOSIS — I509 Heart failure, unspecified: Secondary | ICD-10-CM | POA: Diagnosis not present

## 2015-09-19 DIAGNOSIS — R760 Raised antibody titer: Secondary | ICD-10-CM

## 2015-09-19 DIAGNOSIS — I2510 Atherosclerotic heart disease of native coronary artery without angina pectoris: Secondary | ICD-10-CM

## 2015-09-19 DIAGNOSIS — R790 Abnormal level of blood mineral: Secondary | ICD-10-CM

## 2015-09-19 DIAGNOSIS — R5383 Other fatigue: Secondary | ICD-10-CM

## 2015-09-19 DIAGNOSIS — I10 Essential (primary) hypertension: Secondary | ICD-10-CM | POA: Diagnosis not present

## 2015-09-19 DIAGNOSIS — I4891 Unspecified atrial fibrillation: Secondary | ICD-10-CM | POA: Diagnosis not present

## 2015-09-19 LAB — CBC
HCT: 42.8 % (ref 38.5–50.0)
HEMOGLOBIN: 14.5 g/dL (ref 13.2–17.1)
MCH: 31 pg (ref 27.0–33.0)
MCHC: 33.9 g/dL (ref 32.0–36.0)
MCV: 91.5 fL (ref 80.0–100.0)
MPV: 11.2 fL (ref 7.5–12.5)
Platelets: 246 10*3/uL (ref 140–400)
RBC: 4.68 MIL/uL (ref 4.20–5.80)
RDW: 14.9 % (ref 11.0–15.0)
WBC: 10.6 10*3/uL (ref 3.8–10.8)

## 2015-09-19 LAB — BASIC METABOLIC PANEL
BUN: 28 mg/dL — AB (ref 7–25)
CO2: 23 mmol/L (ref 20–31)
Calcium: 9.2 mg/dL (ref 8.6–10.3)
Chloride: 103 mmol/L (ref 98–110)
Creat: 1.27 mg/dL — ABNORMAL HIGH (ref 0.70–1.25)
GLUCOSE: 93 mg/dL (ref 65–99)
POTASSIUM: 4.5 mmol/L (ref 3.5–5.3)
Sodium: 140 mmol/L (ref 135–146)

## 2015-09-19 LAB — TSH: TSH: 1.98 m[IU]/L (ref 0.40–4.50)

## 2015-09-19 MED ORDER — PRAVASTATIN SODIUM 20 MG PO TABS: 20.0000 mg | ORAL_TABLET | Freq: Every evening | ORAL | Status: DC

## 2015-09-19 NOTE — Patient Instructions (Addendum)
Medication Instructions:  Your physician has recommended you make the following change in your medication:  1.  STOP the Lipitor 2.  START the Pravastatin 20 mg taking 1 tablet daily   Labwork: TODAY:  TSH, CBC, & BMET  Testing/Procedures: None ordered  Follow-Up: Your physician recommends that you schedule a follow-up appointment in: [place]   Any Other Special Instructions Will Be Listed Below (If Applicable). You have been referred to [facility].  THEY WILL CONTACT YOU TO SET UP AN APPT.   If you need a refill on your cardiac medications before your next appointment, please call your pharmacy.

## 2015-09-19 NOTE — Telephone Encounter (Signed)
Phone message received from pt wife he is now interested in participating in cardiac rehab. Attempt to return call, Left message on wife voicemail.  Electronic Order sent to Dr. Martinique for signature.

## 2015-09-24 ENCOUNTER — Telehealth: Payer: Self-pay | Admitting: Physician Assistant

## 2015-09-24 ENCOUNTER — Telehealth: Payer: Self-pay | Admitting: Cardiology

## 2015-09-24 NOTE — Telephone Encounter (Signed)
Fu  Pt returning RN phone call- lab results. Please call back and discuss.   

## 2015-09-24 NOTE — Telephone Encounter (Signed)
New message      Pt coughed up blood this am.  Pt had bypass on jan 19th.  This am he had blood in/near the incision.  Patient states he has been taking ibuprofen for a couple of days.  Pt states he has had no blood in his urine or stool.  He also states that he feels ok.  Wife want to make sure everything is ok.  Please advise

## 2015-09-24 NOTE — Telephone Encounter (Signed)
Pt wife called in stating that pt is c/o of cough this morning that had blood in it and his leg graft site had some blood. This was a one time occurrence but he is still coughing. Pt has right shoulder pain and have been taking "muliple" Ibuprofen over the past few days. Pt wife stated he has no c/o of chest pain or pressure or SOB. Pt c/o of being weak and tired. Pt wife stated that he has had a decrease appetite. Pt is down 30 Lbs since CABG in January.  Wife said she returned from grocery store this morning and pt looked clammy, chalk white, and cold - pt stated he just wanted to lay down and rest. Pt did lift a heavy box, wife said she would get it, but he did it when she left house and she thinks that is what really did him in. She stated she talked to him and he said he is to tired to get in the car to go to the Dr.  [place] her to call 911 and have EMS come pick him up to be evaluated in the ED. She verbalized understanding, no additional questions at this time.

## 2015-09-24 NOTE — Telephone Encounter (Signed)
Left message to call back on both home and wife's phone to call back.

## 2015-09-25 NOTE — Telephone Encounter (Signed)
Returned pts call.  Left another message for pt to return my call

## 2015-09-25 NOTE — Telephone Encounter (Signed)
Pt called back and has been made aware of his lab results.

## 2015-09-25 NOTE — Telephone Encounter (Signed)
-----   Message from [place], [place] sent at 09/19/2015  5:15 PM EDT ----- Kidney function and electrolyte stable.

## 2015-09-27 ENCOUNTER — Emergency Department (HOSPITAL_COMMUNITY): Payer: Medicare Other

## 2015-09-27 ENCOUNTER — Observation Stay (HOSPITAL_COMMUNITY)
Admission: EM | Admit: 2015-09-27 | Discharge: 2015-09-28 | Disposition: A | Discharge status: Home / Self Care | Payer: Medicare Other | Attending: Cardiology | Admitting: Cardiology

## 2015-09-27 ENCOUNTER — Encounter (HOSPITAL_COMMUNITY): Payer: Self-pay | Admitting: *Deleted

## 2015-09-27 DIAGNOSIS — I5033 Acute on chronic diastolic (congestive) heart failure: Secondary | ICD-10-CM

## 2015-09-27 DIAGNOSIS — I509 Heart failure, unspecified: Secondary | ICD-10-CM

## 2015-09-27 DIAGNOSIS — Z87442 Personal history of urinary calculi: Secondary | ICD-10-CM | POA: Insufficient documentation

## 2015-09-27 DIAGNOSIS — Z86711 Personal history of pulmonary embolism: Secondary | ICD-10-CM | POA: Insufficient documentation

## 2015-09-27 DIAGNOSIS — I2510 Atherosclerotic heart disease of native coronary artery without angina pectoris: Secondary | ICD-10-CM | POA: Insufficient documentation

## 2015-09-27 DIAGNOSIS — R404 Transient alteration of awareness: Secondary | ICD-10-CM | POA: Diagnosis not present

## 2015-09-27 DIAGNOSIS — I4891 Unspecified atrial fibrillation: Secondary | ICD-10-CM | POA: Diagnosis not present

## 2015-09-27 DIAGNOSIS — Z79899 Other long term (current) drug therapy: Secondary | ICD-10-CM | POA: Insufficient documentation

## 2015-09-27 DIAGNOSIS — M353 Polymyalgia rheumatica: Secondary | ICD-10-CM | POA: Diagnosis not present

## 2015-09-27 DIAGNOSIS — I10 Essential (primary) hypertension: Secondary | ICD-10-CM | POA: Diagnosis not present

## 2015-09-27 DIAGNOSIS — J90 Pleural effusion, not elsewhere classified: Secondary | ICD-10-CM | POA: Diagnosis present

## 2015-09-27 DIAGNOSIS — Z7901 Long term (current) use of anticoagulants: Secondary | ICD-10-CM | POA: Diagnosis not present

## 2015-09-27 DIAGNOSIS — I110 Hypertensive heart disease with heart failure: Secondary | ICD-10-CM | POA: Diagnosis not present

## 2015-09-27 DIAGNOSIS — I5032 Chronic diastolic (congestive) heart failure: Secondary | ICD-10-CM | POA: Diagnosis present

## 2015-09-27 DIAGNOSIS — R0602 Shortness of breath: Secondary | ICD-10-CM | POA: Diagnosis present

## 2015-09-27 DIAGNOSIS — Z951 Presence of aortocoronary bypass graft: Secondary | ICD-10-CM | POA: Insufficient documentation

## 2015-09-27 DIAGNOSIS — R0902 Hypoxemia: Secondary | ICD-10-CM | POA: Diagnosis not present

## 2015-09-27 DIAGNOSIS — E7800 Pure hypercholesterolemia, unspecified: Secondary | ICD-10-CM | POA: Diagnosis not present

## 2015-09-27 DIAGNOSIS — R531 Weakness: Secondary | ICD-10-CM | POA: Diagnosis not present

## 2015-09-27 DIAGNOSIS — I5031 Acute diastolic (congestive) heart failure: Secondary | ICD-10-CM | POA: Diagnosis not present

## 2015-09-27 DIAGNOSIS — I480 Paroxysmal atrial fibrillation: Secondary | ICD-10-CM | POA: Diagnosis present

## 2015-09-27 HISTORY — DX: Acute on chronic diastolic (congestive) heart failure: I50.33

## 2015-09-27 LAB — CBC
HCT: 39.6 % (ref 39.0–52.0)
HEMOGLOBIN: 13.2 g/dL (ref 13.0–17.0)
MCH: 30.3 pg (ref 26.0–34.0)
MCHC: 33.3 g/dL (ref 30.0–36.0)
MCV: 90.8 fL (ref 78.0–100.0)
PLATELETS: 196 10*3/uL (ref 150–400)
RBC: 4.36 MIL/uL (ref 4.22–5.81)
RDW: 15.4 % (ref 11.5–15.5)
WBC: 9.2 10*3/uL (ref 4.0–10.5)

## 2015-09-27 LAB — BASIC METABOLIC PANEL
Anion gap: 10 (ref 5–15)
BUN: 20 mg/dL (ref 6–20)
CHLORIDE: 109 mmol/L (ref 101–111)
CO2: 23 mmol/L (ref 22–32)
Calcium: 9.2 mg/dL (ref 8.9–10.3)
Creatinine, Ser: 1.32 mg/dL — ABNORMAL HIGH (ref 0.61–1.24)
GFR calc non Af Amer: 54 mL/min — ABNORMAL LOW (ref >60)
Glucose, Bld: 105 mg/dL — ABNORMAL HIGH (ref 65–99)
Potassium: 4 mmol/L (ref 3.5–5.1)
Sodium: 142 mmol/L (ref 135–145)

## 2015-09-27 LAB — I-STAT VENOUS BLOOD GAS, ED
BICARBONATE: 26.1 meq/L — AB (ref 20.0–24.0)
O2 SAT: 37 %
PCO2 VEN: 45 mmHg (ref 45.0–50.0)
PO2 VEN: 23 mmHg — AB (ref 31.0–45.0)
TCO2: 27 mmol/L (ref 0–100)
pH, Ven: 7.372 — ABNORMAL HIGH (ref 7.250–7.300)

## 2015-09-27 LAB — BRAIN NATRIURETIC PEPTIDE: B Natriuretic Peptide: 750.7 pg/mL — ABNORMAL HIGH (ref 0.0–100.0)

## 2015-09-27 LAB — I-STAT TROPONIN, ED: Troponin i, poc: 0.02 ng/mL (ref 0.00–0.08)

## 2015-09-27 MED ORDER — AMLODIPINE BESYLATE 10 MG PO TABS
10.0000 mg | ORAL_TABLET | Freq: Every day | ORAL | Status: DC
  Administered 2015-09-27: 10 mg via ORAL
  Filled 2015-09-27: qty 1

## 2015-09-27 MED ORDER — ATORVASTATIN CALCIUM 20 MG PO TABS
20.0000 mg | ORAL_TABLET | Freq: Every day | ORAL | Status: DC
  Administered 2015-09-27: 20 mg via ORAL
  Filled 2015-09-27: qty 1

## 2015-09-27 MED ORDER — NITROGLYCERIN 2 % TD OINT
0.5000 [in_us] | TOPICAL_OINTMENT | Freq: Four times a day (QID) | TRANSDERMAL | Status: DC
  Administered 2015-09-27 – 2015-09-28 (×3): 0.5 [in_us] via TOPICAL
  Filled 2015-09-27: qty 30
  Filled 2015-09-27: qty 1

## 2015-09-27 MED ORDER — ONDANSETRON HCL 4 MG/2ML IJ SOLN: 4.0000 mg | Freq: Four times a day (QID) | INTRAMUSCULAR | Status: DC | PRN

## 2015-09-27 MED ORDER — AZITHROMYCIN 500 MG IV SOLR
500.0000 mg | Freq: Once | INTRAVENOUS | Status: AC
  Administered 2015-09-27: 500 mg via INTRAVENOUS
  Filled 2015-09-27: qty 500

## 2015-09-27 MED ORDER — ACETAMINOPHEN 325 MG PO TABS: 650.0000 mg | ORAL_TABLET | ORAL | Status: DC | PRN

## 2015-09-27 MED ORDER — FUROSEMIDE 10 MG/ML IJ SOLN
40.0000 mg | Freq: Once | INTRAMUSCULAR | Status: AC
  Administered 2015-09-27: 40 mg via INTRAVENOUS
  Filled 2015-09-27: qty 4

## 2015-09-27 MED ORDER — RIVAROXABAN 20 MG PO TABS
20.0000 mg | ORAL_TABLET | Freq: Every day | ORAL | Status: DC
  Administered 2015-09-27: 20 mg via ORAL
  Filled 2015-09-27: qty 1

## 2015-09-27 MED ORDER — SODIUM CHLORIDE 0.9 % IV SOLN: 250.0000 mL | INTRAVENOUS | Status: DC | PRN

## 2015-09-27 MED ORDER — DEXTROSE 5 % IV SOLN
1.0000 g | Freq: Once | INTRAVENOUS | Status: AC
  Administered 2015-09-27: 1 g via INTRAVENOUS
  Filled 2015-09-27: qty 10

## 2015-09-27 MED ORDER — ZOLPIDEM TARTRATE 5 MG PO TABS
5.0000 mg | ORAL_TABLET | Freq: Every day | ORAL | Status: DC
  Administered 2015-09-27: 5 mg via ORAL
  Filled 2015-09-27: qty 1

## 2015-09-27 MED ORDER — FUROSEMIDE 10 MG/ML IJ SOLN
40.0000 mg | Freq: Two times a day (BID) | INTRAMUSCULAR | Status: DC
  Administered 2015-09-27 – 2015-09-28 (×2): 40 mg via INTRAVENOUS
  Filled 2015-09-27 (×2): qty 4

## 2015-09-27 MED ORDER — SODIUM CHLORIDE 0.9% FLUSH: 3.0000 mL | INTRAVENOUS | Status: DC | PRN

## 2015-09-27 MED ORDER — DEXTROSE 5 % IV SOLN
250.0000 mg | INTRAVENOUS | Status: DC
  Filled 2015-09-27: qty 250

## 2015-09-27 MED ORDER — SODIUM CHLORIDE 0.9% FLUSH
3.0000 mL | Freq: Two times a day (BID) | INTRAVENOUS | Status: DC
  Administered 2015-09-27 – 2015-09-28 (×2): 3 mL via INTRAVENOUS

## 2015-09-27 MED ORDER — METOPROLOL SUCCINATE ER 25 MG PO TB24
25.0000 mg | ORAL_TABLET | Freq: Every day | ORAL | Status: DC
  Administered 2015-09-27: 25 mg via ORAL
  Filled 2015-09-27: qty 1

## 2015-09-27 NOTE — ED Notes (Signed)
Patient undressed, in gown, on monitor, continuous pulse oximetry , blood pressure cuff and oxygen [place]

## 2015-09-27 NOTE — ED Notes (Addendum)
Pt c/o SOB onset yesterday, pt hx of CHF, pt takes Lasix, pt denies CP, n/v/d, pt 89% O2 sats on RA, 4L 94%, pt A&O x4, pt states, "I missed a dose of Lasix last week because I wanted to see if it was what was making me fall." , per spouse pt has had generalized weakness for 3 wks, CABG Jan 2017, recent PNA dx

## 2015-09-27 NOTE — H&P (Addendum)
Cardiologist:  Peter Bruce is an 65 y.o. male.   Chief Complaint:  SOB/Orthopnea  HPI:  Peter Bruce is a 65 y.o. male with a past medical history significant for history of PE on Xarelto, HTN, HLD, PMR, lupus anticoagulant positivity, kidney stones, atrial fib on Xarelto, CAD s/p CABG +MAZE and LAA closure (05/2015), and recent admission for acute diastolic CHF   In 69/6295, he was noted to have worsening fatigue and weakness and found to be in rate-controlled a-fib. He had an echo which showed EF of 65-70%. A myoview was performed which was abnormal suggesting inferior and inferoapical as well as anteroseptal ischemia. 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 Subsequently, he underwent 3 vessel CABG with LIMA to LAD, SVG to RCA and SVG to OM, with MAZE and LAA closure on 05/29/15. Post-operative course was uneventful, however, he had persistent atypical atrial flutter.  He was seen by Dr. Martinique on 08/25/2015 and he recommended cardioversion, however, since the patient was asymptomatic, he declined. Telephone notes indicate his wife called our office to explain he was seen in an urgent care and had symptoms concerning for pneumonia. He apparently had a CXR there and was given Rochephin and Azithromycin. He was started on lasix and presented 09/17/15 for elective cardioversion.   On presentation for DCCV, he was dyspneic, pale and hypoxic with RA saturation of 80%, that came up to 90% on 10L oxygen. GIven the hypoxia noted prior to cardioversion, his procedure was canceled. Stat chest x-ray demonstrated pulmonary edema. He was transferred to cardiac ICU and given IV Lasix. Echocardiogram obtained on 09/09/2015 showed EF 50-55%, no RWMA, moderate MR, mild-to-moderate TR, PA peak pressure 67 mmHg, left pleural effusion. It was not convincing that his acute heart failure was secondary to atrial flutter as his atrial flutter has been present since his bypass  surgery. His volume status improved on IV diuresis. He eventually underwent successful DC cardioversion with conversion to normal sinus rhythm after single 120 J biphasic shock on 09/11/2015. If he has any recurrence of atrial fibrillation later, may consider amiodarone. He was discharged the following day in good condition. His lasix was held one day after discharge due to AKI (creat up to 1.49) and then he resumed lasix 52m daily.   The patient presents today with complaints of SOB, orthopnea, and fatigue.  The orthopnea and SOB started last night.  He says he could hear crackling in his lungs.   He gets very fatigued just walking to the mailbox.  The patient currently denies nausea, vomiting, fever, chest pain, dizziness, PND, cough, congestion, abdominal pain, hematochezia, melena, lower extremity edema, claudication.   Past Medical History  Diagnosis Date  . Hypertension   . PMR (polymyalgia rheumatica) (HCC)   . Acute pulmonary embolus ([place]  . Hypercholesterolemia   . Atrial fibrillation ([facility]     s/p inpatient DCCV 09/11/2015  . Coronary artery disease involving native coronary artery 05/15/2015    multivessel  . Lupus anticoagulant positive 07/29/2008  . History of kidney stones   . 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 S/P Maze operation for atrial fibrillation 05/29/2015    Complete bilateral atrial lesion set via median sternotomy using bipolar radiofrequency and cryothermy ablation with clipping of LA appendage  . Atrial flutter ([facility]     Past Surgical  History  Procedure Laterality Date  . Appendectomy    . Hernia repair    . Shoulder surgery Left     clavicular fracture  . Arm surgery  Right     wrist and elbow  . Cardiac catheterization N/A 05/15/2015    Procedure: Left Heart Cath and Coronary Angiography;  Surgeon: Peter M Martinique, MD;  Location: [place] CV LAB;  Service: Cardiovascular;   Laterality: N/A;  . Coronary artery bypass graft N/A 05/29/2015    Procedure: CORONARY ARTERY BYPASS GRAFTING (CABG), ON PUMP, TIMES THREE, USING LEFT INTERNAL MAMMARY ARTERY, BILATERAL GREATER SAPHENOUS VEINS HARVESTED ENDOSCOPICALLY;  Surgeon: Rexene Alberts, MD;  Location: [place];  Service: Open Heart Surgery;  Laterality: N/A;  . Maze N/A 05/29/2015    Procedure: MAZE;  Surgeon: Rexene Alberts, MD;  Location: [place];  Service: Open Heart Surgery;  Laterality: N/A;  Complete Bi-Atrial Lesion set with Ablation and Cryothermy  . Tee without cardioversion N/A 05/29/2015    Procedure: TRANSESOPHAGEAL ECHOCARDIOGRAM (TEE);  Surgeon: Rexene Alberts, MD;  Location: [place];  Service: Open Heart Surgery;  Laterality: N/A;  . Clipping of atrial appendage  05/29/2015    Procedure: CLIPPING OF LEFT ATRIAL APPENDAGE;  Surgeon: Rexene Alberts, MD;  Location: [place];  Service: Open Heart Surgery;;  45 AtriClip PRO 145  . Cardioversion N/A 09/11/2015    Procedure: CARDIOVERSION;  Surgeon: Lelon Perla, MD;  Location: [facility] ENDOSCOPY;  Service: Cardiovascular;  Laterality: N/A;    Family History  Problem Relation Age of Onset  . Cancer Mother   . Other Sister     chf   Social History:  reports that he has never smoked. He has never used smokeless tobacco. He reports that he does not drink alcohol or use illicit drugs.  Allergies:  Allergies  Allergen Reactions  . Amitriptyline Shortness Of Breath  . 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 Other Nausea And Vomiting    SSRI uptake: makes him extremely sick  . Serotonin Reuptake Inhibitors (Ssris) Nausea And Vomiting    SSRI uptake makes him extremely sick.    Medications Prior to Admission  Medication Sig Dispense Refill  . amLODipine (NORVASC) 10 MG tablet Take 1 tablet (10 mg total) by mouth daily. (Patient taking differently: Take 10 mg by mouth at bedtime. ) 90 tablet 3  . atorvastatin (LIPITOR) 80 MG  tablet Take 20 mg by mouth at bedtime. 1/4 tablet    . diazepam (VALIUM) 10 MG tablet TAKE 1 TABLET EVERY 6 HOURS AS NEEDED FOR ANXIETY. (Patient taking differently: Take 10 mg by mouth every 6 (six) hours as needed for anxiety. ) 120 tablet 5  . furosemide (LASIX) 20 MG tablet Take 1 tablet (20 mg total) by mouth daily. 90 tablet 0  . metoprolol succinate (TOPROL-XL) 25 MG 24 hr tablet Take 1 tablet (25 mg total) by mouth daily. (Patient taking differently: Take 25 mg by mouth at bedtime. ) 90 tablet 3  . rivaroxaban (XARELTO) 20 MG TABS tablet Take 1 tablet (20 mg total) by mouth daily with supper. (Patient taking differently: Take 20 mg by mouth daily after supper. ) 30 tablet 11  . sildenafil (REVATIO) 20 MG tablet Take 5 tablets by mouth daily as needed for ED. (Patient taking differently: Take 100 mg by mouth daily as needed (erectil dysfunction). ) 50 tablet 11  . temazepam (RESTORIL) 30 MG capsule  1 at 7pm to prepare for bed (Patient taking differently: Take 30 mg by mouth at bedtime. Take at 7pm to prepare for bed) 30 capsule 5  . zolpidem (AMBIEN) 10 MG tablet Take 1 tablet (10 mg total) by mouth at bedtime. (Patient taking differently: Take 10 mg by mouth at bedtime. 9pm) 30 tablet 5  . pravastatin (PRAVACHOL) 20 MG tablet Take 1 tablet (20 mg total) by mouth every evening. 90 tablet 3    Results for orders placed or performed during the hospital encounter of 09/27/15 (from the past 48 hour(s))  Basic metabolic panel     Status: Abnormal   Collection Time: 09/27/15 10:10 AM  Result Value Ref Range   Sodium 142 135 - 145 mmol/L   Potassium 4.0 3.5 - 5.1 mmol/L   Chloride 109 101 - 111 mmol/L   CO2 23 22 - 32 mmol/L   Glucose, Bld 105 (H) 65 - 99 mg/dL   BUN 20 6 - 20 mg/dL   Creatinine, Ser 1.32 (H) 0.61 - 1.24 mg/dL   Calcium 9.2 8.9 - 10.3 mg/dL   GFR calc non Af Amer 54 (L) >60 mL/min   GFR calc Af Amer >60 >60 mL/min    Comment: (NOTE) The eGFR has been calculated using the  CKD EPI equation. This calculation has not been validated in all clinical situations. eGFR's persistently <60 mL/min signify possible Chronic Kidney Disease.    Anion gap 10 5 - 15  CBC     Status: None   Collection Time: 09/27/15 10:10 AM  Result Value Ref Range   WBC 9.2 4.0 - 10.5 K/uL   RBC 4.36 4.22 - 5.81 MIL/uL   Hemoglobin 13.2 13.0 - 17.0 g/dL   HCT 39.6 39.0 - 52.0 %   MCV 90.8 78.0 - 100.0 fL   MCH 30.3 26.0 - 34.0 pg   MCHC 33.3 30.0 - 36.0 g/dL   RDW 15.4 11.5 - 15.5 %   Platelets 196 150 - 400 K/uL  Brain natriuretic peptide     Status: Abnormal   Collection Time: 09/27/15 10:10 AM  Result Value Ref Range   B Natriuretic Peptide 750.7 (H) 0.0 - 100.0 pg/mL  I-stat troponin, ED     Status: None   Collection Time: 09/27/15 10:25 AM  Result Value Ref Range   Troponin i, poc 0.02 0.00 - 0.08 ng/mL   Comment 3            Comment: Due to the release kinetics of cTnI, a negative result within the first hours of the onset of symptoms does not rule out myocardial infarction with certainty. If myocardial infarction is still suspected, repeat the test at appropriate intervals.   I-Stat venous blood gas, ED     Status: Abnormal   Collection Time: 09/27/15  2:11 PM  Result Value Ref Range   pH, Ven 7.372 (H) 7.250 - 7.300   pCO2, Ven 45.0 45.0 - 50.0 mmHg   pO2, Ven 23.0 (L) 31.0 - 45.0 mmHg   Bicarbonate 26.1 (H) 20.0 - 24.0 mEq/L   TCO2 27 0 - 100 mmol/L   O2 Saturation 37.0 %   Patient temperature HIDE    Sample type VENOUS    Comment NOTIFIED PHYSICIAN    Dg Chest 2 View  09/27/2015  CLINICAL DATA:  Shortness of breath, hypoxia EXAM: CHEST  2 VIEW COMPARISON:  09/09/2015 FINDINGS: Moderate bilateral pleural effusions. Patchy right lower lobe opacity, worrisome for pneumonia, although atelectasis is still  possible. Associated left lower lobe opacity, likely atelectasis. No pneumothorax. Cardiomegaly.  Postsurgical changes related to prior CABG. Degenerative changes  of the visualized thoracolumbar spine. Median sternotomy. IMPRESSION: Moderate bilateral pleural effusions. Patchy right lower lobe opacity, worrisome for pneumonia. Left lower lobe opacity, likely atelectasis. Electronically Signed   By: Julian Hy M.D.   On: 09/27/2015 12:02    Review of Systems  Constitutional: Positive for malaise/fatigue. Negative for fever and diaphoresis.  HENT: Negative for congestion and sore throat.   Respiratory: Positive for cough (Some phlegm ) and shortness of breath.   Cardiovascular: Negative for chest pain, palpitations, orthopnea, leg swelling and PND.  Gastrointestinal: Negative for nausea, vomiting, abdominal pain, blood in stool and melena.  Genitourinary: Negative for dysuria.  Musculoskeletal: Negative for myalgias.  Neurological: Negative for dizziness.  All other systems reviewed and are negative.   Blood pressure 151/89, pulse 92, temperature 98.2 F (36.8 C), temperature source Oral, resp. rate 27, height _0  (1.778 m), weight 180 lb 11.2 oz (81.965 kg), SpO2 97 %. Physical Exam  Nursing note and vitals reviewed.  Well nourished, well developed, in no acute distress HEENT: Pupils are equal round react to light accommodation extraocular movements are intact.  Neck:  JVD elevatedNo cervical lymphadenopathy. Cardiac: Regular rate and rhythm with 2/6 high pitch sys MM at Apex Lungs:  Decreased BS bilaterally and basilar crackles Abd: soft, nontender, positive bowel sounds all quadrants, no hepatosplenomegaly Ext: no lower extremity edema.  2+ left radial and dorsalis pedis pulses. 1+ right radial Skin: warm and dry Neuro:  Grossly normal    Assessment/Plan Principal Problem:   Acute on chronic diastolic heart failure (HCC) Active Problems:   Paroxysmal atrial fibrillation (HCC)   S/P CABG x 3 and maze procedure   Current use of long term anticoagulation   Bilateral pleural effusion     65 y.o. male with a past medical history  significant for history of PE on Xarelto, HTN, HLD, PMR, lupus anticoagulant positivity, kidney stones, atrial fib on Xarelto, CAD s/p CABG +MAZE and LAA closure (05/2015), and recent admission for acute diastolic CHF.  He underwent successful DC cardioversion on 09/11/2015.  He was in SR on 5/12 when seen in the office.   Presents now with SOB, fatigue and orthopnea.   CXR reveals moderate bilateral pleural effusions, possible RLL pna.  BNP is 750.7.  Troponin is negative.  WBCs WNL.  Blood cultures drawn.   EKG indicates Afib but the beats are regular.  P waves difficult to see.  Irregegular on exam and tele looks like afib but just may be due to PVCs.  Previous plan was for Amiodarone if he went back into afib.  Recheck EKG in AM.   He was given IV lasix in the ER with good response.  Continue 46m IV BID.    CHADSVASC 4.  Continue Xarelto.     BTarri Fuller PA-C 09/27/2015, 3:57 PM   See prior attestation note earlier.  WKerry HoughMD [facility]

## 2015-09-27 NOTE — Progress Notes (Signed)
Given patient was recently admitted to cardiology for similar issues, called cardiology Dr. Wynonia Lawman. He agreed patient should be admitted to their service.

## 2015-09-27 NOTE — Plan of Care (Signed)
Problem: Pain Managment: Goal: General experience of comfort will improve Outcome: Completed/Met Date Met:  09/27/15 Pt educated on pain scale and interventions available. Pt verbalized understanding.      

## 2015-09-27 NOTE — ED Provider Notes (Signed)
CSN: ID:[id number]     Arrival date & time 09/27/15  [id number] History   First MD Initiated Contact with Patient 09/27/15 [phone]     Chief Complaint  Patient presents with  . Shortness of Breath  . Weakness     (Consider location/radiation/quality/duration/timing/severity/associated sxs/prior Treatment) HPI Peter Bruce is a 65 y.o. male with a history of A. fib on Xarelto-maze procedure, known coronary artery disease status post CABG, here for evaluation of shortness of breath and weakness. Patient reports he has felt increasingly weak and short of breath since May. He reports over the past 2 nights he has had orthopnea. He also reports a dry cough. Denies fevers, chills, chest pain, leg swelling, back pain, abdominal pain, nausea or vomiting, diaphoresis.  Past Medical History  Diagnosis Date  . Hypertension   . Arthritis   . Neuromuscular disorder ([place])   . PMR (polymyalgia rheumatica) (HCC)   . Acute pulmonary embolus ([place]) 2010  . Hypercholesterolemia   . Atrial fibrillation [facility])     s/p inpatient DCCV 09/11/2015  . Coronary artery disease involving native coronary artery 05/15/2015    multivessel  . Lupus anticoagulant positive 07/29/2008  . History of kidney stones   . 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 S/P Maze operation for atrial fibrillation 05/29/2015    Complete bilateral atrial lesion set via median sternotomy using bipolar radiofrequency and cryothermy ablation with clipping of LA appendage  . Atrial flutter [facility])    Past Surgical History  Procedure Laterality Date  . Appendectomy    . Hernia repair    . Shoulder surgery Left     clavicular fracture  . Arm surgery  Right     wrist and elbow  . Cardiac catheterization N/A 05/15/2015    Procedure: Left Heart Cath and Coronary Angiography;  Surgeon: Peter M Martinique, MD;  Location: [place] CV LAB;  Service: Cardiovascular;  Laterality: N/A;  . Coronary  artery bypass graft N/A 05/29/2015    Procedure: CORONARY ARTERY BYPASS GRAFTING (CABG), ON PUMP, TIMES THREE, USING LEFT INTERNAL MAMMARY ARTERY, BILATERAL GREATER SAPHENOUS VEINS HARVESTED ENDOSCOPICALLY;  Surgeon: Rexene Alberts, MD;  Location: [place];  Service: Open Heart Surgery;  Laterality: N/A;  . Maze N/A 05/29/2015    Procedure: MAZE;  Surgeon: Rexene Alberts, MD;  Location: [place];  Service: Open Heart Surgery;  Laterality: N/A;  Complete Bi-Atrial Lesion set with Ablation and Cryothermy  . Tee without cardioversion N/A 05/29/2015    Procedure: TRANSESOPHAGEAL ECHOCARDIOGRAM (TEE);  Surgeon: Rexene Alberts, MD;  Location: [place];  Service: Open Heart Surgery;  Laterality: N/A;  . Clipping of atrial appendage  05/29/2015    Procedure: CLIPPING OF LEFT ATRIAL APPENDAGE;  Surgeon: Rexene Alberts, MD;  Location: [place];  Service: Open Heart Surgery;;  45 AtriClip PRO 145  . Cardioversion N/A 09/11/2015    Procedure: CARDIOVERSION;  Surgeon: Lelon Perla, MD;  Location: [facility] ENDOSCOPY;  Service: Cardiovascular;  Laterality: N/A;   Family History  Problem Relation Age of Onset  . Cancer Mother   . Other Sister     chf   Social History  Substance Use Topics  . Smoking status: Never Smoker   . Smokeless tobacco: Never Used  . Alcohol Use: No    Review of Systems A 10 point review of systems was completed and was negative except  for pertinent positives and negatives as mentioned in the history of present illness     Allergies  Amitriptyline; Heparin; Other; and Serotonin reuptake inhibitors (ssris)  Home Medications   Prior to Admission medications   Medication Sig Start Date End Date Taking? Authorizing Provider  amLODipine (NORVASC) 10 MG tablet Take 1 tablet (10 mg total) by mouth daily. Patient taking differently: Take 10 mg by mouth at bedtime.  09/12/15  Yes Almyra Deforest, PA  atorvastatin (LIPITOR) 80 MG tablet Take 20 mg by mouth at bedtime. 1/4 tablet 09/12/15  Yes Historical  Provider, MD  diazepam (VALIUM) 10 MG tablet TAKE 1 TABLET EVERY 6 HOURS AS NEEDED FOR ANXIETY. Patient taking differently: Take 10 mg by mouth every 6 (six) hours as needed for anxiety.  08/20/15  Yes [name], MD  furosemide (LASIX) 20 MG tablet Take 1 tablet (20 mg total) by mouth daily. 09/13/15  Yes Almyra Deforest, PA  metoprolol succinate (TOPROL-XL) 25 MG 24 hr tablet Take 1 tablet (25 mg total) by mouth daily. Patient taking differently: Take 25 mg by mouth at bedtime.  08/25/15  Yes Peter M Martinique, MD  rivaroxaban (XARELTO) 20 MG TABS tablet Take 1 tablet (20 mg total) by mouth daily with supper. Patient taking differently: Take 20 mg by mouth daily after supper.  04/14/15  Yes Peter M Martinique, MD  sildenafil (REVATIO) 20 MG tablet Take 5 tablets by mouth daily as needed for ED. Patient taking differently: Take 100 mg by mouth daily as needed (erectil dysfunction).  08/20/15  Yes [name], MD  temazepam (RESTORIL) 30 MG capsule 1 at 7pm to prepare for bed Patient taking differently: Take 30 mg by mouth at bedtime. Take at 7pm to prepare for bed 08/20/15  Yes [name], MD  zolpidem (AMBIEN) 10 MG tablet Take 1 tablet (10 mg total) by mouth at bedtime. Patient taking differently: Take 10 mg by mouth at bedtime. 9pm 08/20/15  Yes [name], MD  pravastatin (PRAVACHOL) 20 MG tablet Take 1 tablet (20 mg total) by mouth every evening. 09/19/15   Eileen Stanford, PA-C   BP 126/95 mmHg  Pulse 93  Temp(Src) 98.4 F (36.9 C) (Oral)  Resp 23  Ht 5' 10.5" (1.791 m)  Wt 85.872 kg  BMI 26.77 kg/m2  SpO2 92% Physical Exam  Constitutional: He is oriented to person, place, and time. He appears well-developed and well-nourished.  HENT:  Head: Normocephalic and atraumatic.  Mouth/Throat: Oropharynx is clear and moist.  Eyes: Conjunctivae are normal. Pupils are equal, round, and reactive to light. Right eye exhibits no discharge. Left eye exhibits no discharge. No scleral  icterus.  Neck: Neck supple.  Cardiovascular: Normal rate, regular rhythm and normal heart sounds.   Pulmonary/Chest: Effort normal and breath sounds normal. No respiratory distress. He has no wheezes.  Rales bilateral bases. No other obvious adventitious lung sounds. Mild increased work of breathing. Patient 88% on 5 L nasal cannula.  Abdominal: Soft. There is no tenderness.  Musculoskeletal: He exhibits no tenderness.  Neurological: He is alert and oriented to person, place, and time.  Cranial Nerves II-XII grossly intact  Skin: Skin is warm and dry. No rash noted.  Psychiatric: He has a normal mood and affect.  Nursing note and vitals reviewed.   ED Course  Procedures (including critical care time) Labs Review Labs Reviewed  BASIC METABOLIC PANEL - Abnormal; Notable for the following:    Glucose, Bld 105 (*)  Creatinine, Ser 1.32 (*)    GFR calc non Af Amer 54 (*)    All other components within normal limits  BRAIN NATRIURETIC PEPTIDE - Abnormal; Notable for the following:    B Natriuretic Peptide 750.7 (*)    All other components within normal limits  CULTURE, BLOOD (ROUTINE X 2)  CULTURE, BLOOD (ROUTINE X 2)  CBC  BLOOD GAS, VENOUS  I-STAT TROPOININ, ED    Imaging Review Dg Chest 2 View  09/27/2015  CLINICAL DATA:  Shortness of breath, hypoxia EXAM: CHEST  2 VIEW COMPARISON:  09/09/2015 FINDINGS: Moderate bilateral pleural effusions. Patchy right lower lobe opacity, worrisome for pneumonia, although atelectasis is still possible. Associated left lower lobe opacity, likely atelectasis. No pneumothorax. Cardiomegaly.  Postsurgical changes related to prior CABG. Degenerative changes of the visualized thoracolumbar spine. Median sternotomy. IMPRESSION: Moderate bilateral pleural effusions. Patchy right lower lobe opacity, worrisome for pneumonia. Left lower lobe opacity, likely atelectasis. Electronically Signed   By: Julian Hy M.D.   On: 09/27/2015 12:02   I have  personally reviewed and evaluated these images and lab results as part of my medical decision-making.   EKG Interpretation   Date/Time:  Saturday Sep 27 2015 10:04:27 EDT Ventricular Rate:  92 PR Interval:    QRS Duration: 102 QT Interval:  406 QTC Calculation: 502 R Axis:   -105 Text Interpretation:  Atrial fibrillation Ventricular premature complex  Anterior infarct, old Nonspecific T abnormalities, lateral leads Baseline  wander Abnormal ekg Confirmed by Carmin Muskrat  MD [phone]) on 09/27/2015  10:12:12 AM Also confirmed by Carmin Muskrat  MD ([id number]), editor [place],  Joelene Millin [phone])  on 09/27/2015 12:06:35 PM     Meds given in ED:  Medications  nitroGLYCERIN (NITROGLYN) 2 % ointment 0.5 inch (0.5 inches Topical Given 09/27/15 1212)  azithromycin (ZITHROMAX) 500 mg in dextrose 5 % 250 mL IVPB (not administered)  furosemide (LASIX) injection 40 mg (40 mg Intravenous Given 09/27/15 1210)  cefTRIAXone (ROCEPHIN) 1 g in dextrose 5 % 50 mL IVPB (1 g Intravenous New Bag/Given 09/27/15 1311)    New Prescriptions   No medications on file   Filed Vitals:   09/27/15 1207 09/27/15 1245 09/27/15 1300 09/27/15 1315  BP:  139/94 141/102 126/95  Pulse: 88 93 93 93  Temp:      TempSrc:      Resp: 27 24 29 23   Height:      Weight:      SpO2: 92% 95% 91% 92%    MDM  Melven Mitrani is a 65 y.o. male with a history of A. fib status post maze procedure, on Xarelto, coronary artery disease status post CABG in January, here for evaluation of progressive shortness of breath. Upon review of old notes, patient had an echo done 5/2 found to have EF 55%. Denies any chest pain. However on arrival, he is short of breath and is 89% on room air. Currently on nonrebreather. Bilateral rales on pulmonary auscultation, had regular rate and rhythm on auscultation, but on EKG shows A. fib. Pending screening labs, BNP, chest x-ray. Will reevaluate, plan for likely admission for hypoxia. Discussed with Dr.  Vanita Panda. BNP elevated at 750, evidence of pneumonia on chest x-ray with moderate bilateral pleural effusions. Blood cultures obtained and Patient started on IV antibiotics for community acquired pneumonia. Also given Nitropaste, 40 mg Lasix. Discussed with family practice, Dr. Emmaline Life, requests VBG-patient admitted to hospital.  Final diagnoses:  Hypoxia  Acute on chronic congestive heart failure, unspecified  congestive heart failure type [facility])        Comer Locket, PA-C 09/27/15 1344  Carmin Muskrat, MD 09/28/15 1100

## 2015-09-27 NOTE — ED Notes (Signed)
Spoke with [place], PA pt to have blood cultures collected prior to abx admin

## 2015-09-28 DIAGNOSIS — R0902 Hypoxemia: Secondary | ICD-10-CM | POA: Diagnosis not present

## 2015-09-28 DIAGNOSIS — M353 Polymyalgia rheumatica: Secondary | ICD-10-CM | POA: Diagnosis not present

## 2015-09-28 DIAGNOSIS — I10 Essential (primary) hypertension: Secondary | ICD-10-CM | POA: Diagnosis not present

## 2015-09-28 DIAGNOSIS — I509 Heart failure, unspecified: Secondary | ICD-10-CM | POA: Diagnosis not present

## 2015-09-28 DIAGNOSIS — I5031 Acute diastolic (congestive) heart failure: Secondary | ICD-10-CM | POA: Diagnosis not present

## 2015-09-28 LAB — BASIC METABOLIC PANEL
Anion gap: 9 (ref 5–15)
BUN: 18 mg/dL (ref 6–20)
CHLORIDE: 107 mmol/L (ref 101–111)
CO2: 26 mmol/L (ref 22–32)
CREATININE: 1.37 mg/dL — AB (ref 0.61–1.24)
Calcium: 8.8 mg/dL — ABNORMAL LOW (ref 8.9–10.3)
GFR calc Af Amer: >60 mL/min (ref >60)
GFR calc non Af Amer: 52 mL/min — ABNORMAL LOW (ref >60)
GLUCOSE: 102 mg/dL — AB (ref 65–99)
POTASSIUM: 3.2 mmol/L — AB (ref 3.5–5.1)
SODIUM: 142 mmol/L (ref 135–145)

## 2015-09-28 MED ORDER — POTASSIUM CHLORIDE 20 MEQ/15ML (10%) PO SOLN
40.0000 meq | Freq: Once | ORAL | Status: AC
  Administered 2015-09-28: 40 meq via ORAL
  Filled 2015-09-28: qty 30

## 2015-09-28 MED ORDER — FUROSEMIDE 20 MG PO TABS: 40.0000 mg | ORAL_TABLET | Freq: Every day | ORAL | Status: DC

## 2015-09-28 MED ORDER — AZITHROMYCIN 250 MG PO TABS: ORAL_TABLET | ORAL | Status: DC

## 2015-09-28 NOTE — Progress Notes (Signed)
Subjective:  He feels much better today with less shortness of breath and is currently not coughing.  He is anxious to go home and states that he has bills he needs to get paid and wants to get out of here as soon as possible today.  Objective:  Vital Signs in the last 24 hours: BP 122/76 mmHg  Pulse 89  Temp(Src) 98.3 F (36.8 C) (Oral)  Resp 17  Ht 5\' 10"  (1.778 m)  Wt 78.744 kg (173 lb 9.6 oz)  BMI 24.91 kg/m2  SpO2 95%  Physical Exam: Mild anxious white male in no acute distress Lungs:  Clear  Cardiac:  Regular rhythm, normal S1 and S2, no S3, occasional irregular beats noted Abdomen:  Soft, nontender, no masses Extremities:  No edema present  Intake/Output from previous day: 05/20 0701 - 05/21 0700 In: 410 [P.O.:360; I.V.:50] Out: 2475 [Urine:2475] Weight Filed Weights   09/27/15 1015 09/27/15 1443 09/28/15 0425  Weight: 85.872 kg (189 lb 5 oz) 81.965 kg (180 lb 11.2 oz) 78.744 kg (173 lb 9.6 oz)    Lab Results: Basic Metabolic Panel:  Recent Labs  09/27/15 1010 09/28/15 0405  NA 142 142  K 4.0 3.2*  CL 109 107  CO2 23 26  GLUCOSE 105* 102*  BUN 20 18  CREATININE 1.32* 1.37*    CBC:  Recent Labs  09/27/15 1010  WBC 9.2  HGB 13.2  HCT 39.6  MCV 90.8  PLT 196    BNP    Component Value Date/Time   BNP 750.7* 09/27/2015 1010   Telemetry: Sinus with PACs as well as PVCs.  Assessment/Plan:  1.  Acute on chronic diastolic heart failure clinically improved with diuresis overnight 2.  Possible recent pneumonia has received some IV antibiotics 3.  Prior coronary artery bypass grafting with Maze procedure 4.  History of atrial fibrillation but in sinus rhythm this admission 5.  Long-term use of anticoagulation  Recommendations:  He really wants to go home.  I think this would be reasonable and he should go home to weigh every day and go home on furosemide 40 mg daily.  I also feel he should go home on antibiotics to allow time for healing of any  recent bronchitis or pneumonia.  Early follow-up this Thursday.      Kerry Hough  MD [facility]  09/28/2015, 8:31 AM

## 2015-09-28 NOTE — Discharge Summary (Signed)
Discharge Summary    Patient ID: Peter Bruce,  MRN: [id number], DOB/AGE: 1950-05-29 65 y.o.  Admit date: 09/27/2015 Discharge date: 09/28/2015  Primary Care Provider: No PCP Per Patient Primary Cardiologist: Martinique  Discharge Diagnoses    Principal Problem:   Acute on chronic diastolic heart failure ([place]) Active Problems:   Paroxysmal atrial fibrillation (HCC)   S/P CABG x 3 and maze procedure   Current use of long term anticoagulation   Bilateral pleural effusion   Acute on chronic diastolic (congestive) heart failure (HCC)   Allergies Allergies  Allergen Reactions  . Amitriptyline Shortness Of Breath  . 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 Other Nausea And Vomiting    SSRI uptake: makes him extremely sick  . Serotonin Reuptake Inhibitors (Ssris) Nausea And Vomiting    SSRI uptake makes him extremely sick.    Diagnostic Studies/Procedures   CHEST 2 VIEW  COMPARISON: 09/09/2015  FINDINGS: Moderate bilateral pleural effusions.  Patchy right lower lobe opacity, worrisome for pneumonia, although atelectasis is still possible.  Associated left lower lobe opacity, likely atelectasis.  No pneumothorax.  Cardiomegaly. Postsurgical changes related to prior CABG.  Degenerative changes of the visualized thoracolumbar spine. Median sternotomy.  IMPRESSION: Moderate bilateral pleural effusions.  Patchy right lower lobe opacity, worrisome for pneumonia.  Left lower lobe opacity, likely atelectasis.    _____________   History of Present Illness     Peter Bruce is a 65 y.o. male with a past medical history significant for history of PE on Xarelto, HTN, HLD, PMR, lupus anticoagulant positivity, kidney stones, atrial fib on Xarelto, CAD s/p CABG +MAZE and LAA closure (05/2015), and recent admission for acute diastolic CHF   In [id number], he was noted to have worsening fatigue and weakness and found to be  in rate-controlled a-fib. He had an echo which showed EF of 65-70%. A myoview was performed which was abnormal suggesting inferior and inferoapical as well as anteroseptal ischemia. 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 Subsequently, he underwent 3 vessel CABG with LIMA to LAD, SVG to RCA and SVG to OM, with MAZE and LAA closure on 05/29/15. Post-operative course was uneventful, however, he had persistent atypical atrial flutter.  He was seen by Dr. Martinique on 08/25/2015 and he recommended cardioversion, however, since the patient was asymptomatic, he declined. Telephone notes indicate his wife called our office to explain he was seen in an urgent care and had symptoms concerning for pneumonia. He apparently had a CXR there and was given Rochephin and Azithromycin. He was started on lasix and presented 09/17/15 for elective cardioversion.   On presentation for DCCV, he was dyspneic, pale and hypoxic with RA saturation of 80%, that came up to 90% on 10L oxygen. GIven the hypoxia noted prior to cardioversion, his procedure was canceled. Stat chest x-ray demonstrated pulmonary edema. He was transferred to cardiac ICU and given IV Lasix. Echocardiogram obtained on 09/09/2015 showed EF 50-55%, no RWMA, moderate MR, mild-to-moderate TR, PA peak pressure 67 mmHg, left pleural effusion. It was not convincing that his acute heart failure was secondary to atrial flutter as his atrial flutter has been present since his bypass surgery. His volume status improved on IV diuresis. He eventually underwent successful DC cardioversion with conversion to normal sinus rhythm after single 120 J biphasic shock on 09/11/2015. If he has any recurrence of atrial fibrillation later, may consider amiodarone.  He was discharged the following day in good condition. His lasix was held one day after discharge due to AKI (creat up to 1.49) and then he resumed lasix 20mg  daily.   The patient presents today  with complaints of SOB, orthopnea, and fatigue. The orthopnea and SOB started last night. He says he could hear crackling in his lungs. He gets very fatigued just walking to the mailbox. The patient currently denies nausea, vomiting, fever, chest pain, dizziness, PND, cough, congestion, abdominal pain, hematochezia, melena, lower extremity edema, claudication.  Hospital Course     Consultants: None  The patient was admitted and continued on IV lasix.  He diureses 2.0 liters and felt better.  We also continued IV azithromycin.  We will continue 40mg  PO lasix daily at home. Daily weight monitoring and low sodium diet discussed.  He was also continued on PO azithromycin for PNA which was suggested on the CXR.  Blood cultures pending.   He maintained NSR while here.  Continue Xarelto for CHADSVASC 4.  The patient was seen by Dr. Wynonia Lawman who felt she was stable for DC home.  _____________  Discharge Vitals Blood pressure 122/76, pulse 89, temperature 98.3 F (36.8 C), temperature source Oral, resp. rate 17, height 5\' 10"  (1.778 m), weight 173 lb 9.6 oz (78.744 kg), SpO2 95 %.  Filed Weights   09/27/15 1015 09/27/15 1443 09/28/15 0425  Weight: 189 lb 5 oz (85.872 kg) 180 lb 11.2 oz (81.965 kg) 173 lb 9.6 oz (78.744 kg)    Labs & Radiologic Studies    CBC  Recent Labs  09/27/15 1010  WBC 9.2  HGB 13.2  HCT 39.6  MCV 90.8  PLT 123456   Basic Metabolic Panel  Recent Labs  09/27/15 1010 09/28/15 0405  NA 142 142  K 4.0 3.2*  CL 109 107  CO2 23 26  GLUCOSE 105* 102*  BUN 20 18  CREATININE 1.32* 1.37*  CALCIUM 9.2 8.8*   Liver Function Tests No results for input(s): AST, ALT, ALKPHOS, BILITOT, PROT, ALBUMIN in the last 72 hours. No results for input(s): LIPASE, AMYLASE in the last 72 hours. Cardiac Enzymes No results for input(s): CKTOTAL, CKMB, CKMBINDEX, TROPONINI in the last 72 hours. BNP Invalid input(s): POCBNP D-Dimer No results for input(s): DDIMER in the last 72  hours. Hemoglobin A1C No results for input(s): HGBA1C in the last 72 hours. Fasting Lipid Panel No results for input(s): CHOL, HDL, LDLCALC, TRIG, CHOLHDL, LDLDIRECT in the last 72 hours. Thyroid Function Tests No results for input(s): TSH, T4TOTAL, T3FREE, THYROIDAB in the last 72 hours.  Invalid input(s): FREET3 _____________  Dg Chest 2 View  09/27/2015  CLINICAL DATA:  Shortness of breath, hypoxia EXAM: CHEST  2 VIEW COMPARISON:  09/09/2015 FINDINGS: Moderate bilateral pleural effusions. Patchy right lower lobe opacity, worrisome for pneumonia, although atelectasis is still possible. Associated left lower lobe opacity, likely atelectasis. No pneumothorax. Cardiomegaly.  Postsurgical changes related to prior CABG. Degenerative changes of the visualized thoracolumbar spine. Median sternotomy. IMPRESSION: Moderate bilateral pleural effusions. Patchy right lower lobe opacity, worrisome for pneumonia. Left lower lobe opacity, likely atelectasis. Electronically Signed   By: Julian Hy M.D.   On: 09/27/2015 12:02   Dg Chest Port 1 View  09/09/2015  CLINICAL DATA:  65 year old male with acute respiratory distress wall being prepped for cardioversion. Initial encounter. EXAM: PORTABLE CHEST 1 VIEW COMPARISON:  06/09/2015 and earlier. FINDINGS: Portable AP semi upright view at 1311 hours. Chronic cardiomegaly. Sequelae of CABG. Mediastinal contours  appear stable. Increased pulmonary vascularity with confluent bibasilar opacity mostly obscuring the diaphragm. IMPRESSION: Suspected interstitial pulmonary edema with bilateral pleural effusions and bilateral lower lobe collapse or consolidation. Electronically Signed   By: Genevie Ann M.D.   On: 09/09/2015 13:25   Disposition   Pt is being discharged home today in good condition.  Follow-up Plans & Appointments    Follow-up Information    Follow up with [facility]   Specialty:  Cardiology   Why:  The office scheduler will call  you with the follow up appt. date and time.   Contact information:   [address] [place] [phone]     Discharge Instructions    Diet - low sodium heart healthy    Complete by:  As directed      Discharge instructions    Complete by:  As directed   Monitor your weight every morning.  If you gain 3 pounds in 24 hours, or 5 pounds in a week, call the office for instructions.     Increase activity slowly    Complete by:  As directed            Discharge Medications   Current Discharge Medication List    START taking these medications   Details  azithromycin (ZITHROMAX) 250 MG tablet 1 tab daily from five days Qty: 5 each, Refills: 0      CONTINUE these medications which have CHANGED   Details  furosemide (LASIX) 20 MG tablet Take 2 tablets (40 mg total) by mouth daily. Qty: 30 tablet, Refills: 5      CONTINUE these medications which have NOT CHANGED   Details  amLODipine (NORVASC) 10 MG tablet Take 1 tablet (10 mg total) by mouth daily. Qty: 90 tablet, Refills: 3    atorvastatin (LIPITOR) 80 MG tablet Take 20 mg by mouth at bedtime. 1/4 tablet    diazepam (VALIUM) 10 MG tablet TAKE 1 TABLET EVERY 6 HOURS AS NEEDED FOR ANXIETY. Qty: 120 tablet, Refills: 5    metoprolol succinate (TOPROL-XL) 25 MG 24 hr tablet Take 1 tablet (25 mg total) by mouth daily. Qty: 90 tablet, Refills: 3    rivaroxaban (XARELTO) 20 MG TABS tablet Take 1 tablet (20 mg total) by mouth daily with supper. Qty: 30 tablet, Refills: 11    sildenafil (REVATIO) 20 MG tablet Take 5 tablets by mouth daily as needed for ED. Qty: 50 tablet, Refills: 11   Associated Diagnoses: Erectile dysfunction, unspecified erectile dysfunction type    temazepam (RESTORIL) 30 MG capsule 1 at 7pm to prepare for bed Qty: 30 capsule, Refills: 5   Associated Diagnoses: Insomnia    zolpidem (AMBIEN) 10 MG tablet Take 1 tablet (10 mg total) by mouth at bedtime. Qty: 30  tablet, Refills: 5   Associated Diagnoses: Insomnia      STOP taking these medications     pravastatin (PRAVACHOL) 20 MG tablet            Outstanding Labs/Studies     Duration of Discharge Encounter   Greater than 30 minutes including physician time.  Otilio Connors [facility] 09/28/2015, 8:58 AM  Agree with above discharge summary   W. Doristine Church. MD [facility] 09/28/2015 12:44 PM

## 2015-10-02 ENCOUNTER — Ambulatory Visit: Payer: Medicare Other | Admitting: Physician Assistant

## 2015-10-02 LAB — CULTURE, BLOOD (ROUTINE X 2)
CULTURE: NO GROWTH
Culture: NO GROWTH

## 2015-10-09 ENCOUNTER — Telehealth: Payer: Self-pay | Admitting: Cardiology

## 2015-10-09 NOTE — Telephone Encounter (Signed)
Returned call to patient's wife no answer.LMTC. 

## 2015-10-09 NOTE — Telephone Encounter (Signed)
Received a call from patient's wife.She stated she is worried about husband.Stated he was discharged from [facility] hospital 09/28/15 and his weakness is no better.Stated his sob has improved,but he continues to be very weak.Stated he told her he just don't feel right.Stated she feels like something is wrong and she wants to help him feel better.Stated his weight is stable,no chest pain.Post hospital appointment scheduled with Kerin Ransom PA Tue 10/10/15 at 10:00 am.Advised to call sooner if needed.

## 2015-10-09 NOTE — Telephone Encounter (Signed)
Please call,pt is very depressed and he does not want to eat.

## 2015-10-09 NOTE — Telephone Encounter (Signed)
Follow-up     The pt wife is calling back cause the wife can not access "My Chart" information, returning [place] phone call back.

## 2015-10-10 ENCOUNTER — Telehealth (HOSPITAL_COMMUNITY): Payer: Self-pay | Admitting: Cardiac Rehabilitation

## 2015-10-10 NOTE — Telephone Encounter (Signed)
Third phone call  to pt to discuss enrolling in cardiac rehab. Left message on answering machine. Letter mailed to pt home.

## 2015-10-14 ENCOUNTER — Ambulatory Visit (INDEPENDENT_AMBULATORY_CARE_PROVIDER_SITE_OTHER): Payer: Medicare Other | Admitting: Cardiology

## 2015-10-14 ENCOUNTER — Encounter: Payer: Self-pay | Admitting: Cardiology

## 2015-10-14 VITALS — BP 130/88 | HR 92 | Ht 70.0 in | Wt 186.1 lb

## 2015-10-14 DIAGNOSIS — I480 Paroxysmal atrial fibrillation: Secondary | ICD-10-CM

## 2015-10-14 DIAGNOSIS — R5383 Other fatigue: Secondary | ICD-10-CM

## 2015-10-14 DIAGNOSIS — Z7901 Long term (current) use of anticoagulants: Secondary | ICD-10-CM

## 2015-10-14 DIAGNOSIS — I4891 Unspecified atrial fibrillation: Secondary | ICD-10-CM | POA: Diagnosis not present

## 2015-10-14 DIAGNOSIS — E785 Hyperlipidemia, unspecified: Secondary | ICD-10-CM

## 2015-10-14 DIAGNOSIS — I2510 Atherosclerotic heart disease of native coronary artery without angina pectoris: Secondary | ICD-10-CM

## 2015-10-14 DIAGNOSIS — Z951 Presence of aortocoronary bypass graft: Secondary | ICD-10-CM

## 2015-10-14 LAB — LIPID PANEL
Cholesterol: 147 mg/dL (ref 125–200)
HDL: 47 mg/dL (ref >=40)
LDL Cholesterol: 90 mg/dL (ref <130)
Total CHOL/HDL Ratio: 3.1 Ratio (ref <=5.0)
Triglycerides: 49 mg/dL (ref <150)
VLDL: 10 mg/dL (ref <30)

## 2015-10-14 LAB — COMPREHENSIVE METABOLIC PANEL
ALT: 18 U/L (ref 9–46)
AST: 20 U/L (ref 10–35)
Albumin: 3.9 g/dL (ref 3.6–5.1)
Alkaline Phosphatase: 54 U/L (ref 40–115)
BUN: 22 mg/dL (ref 7–25)
CO2: 25 mmol/L (ref 20–31)
Calcium: 9.4 mg/dL (ref 8.6–10.3)
Chloride: 105 mmol/L (ref 98–110)
Creat: 1.25 mg/dL (ref 0.70–1.25)
Glucose, Bld: 83 mg/dL (ref 65–99)
Potassium: 4.1 mmol/L (ref 3.5–5.3)
Sodium: 141 mmol/L (ref 135–146)
Total Bilirubin: 1 mg/dL (ref 0.2–1.2)
Total Protein: 6.5 g/dL (ref 6.1–8.1)

## 2015-10-14 MED ORDER — FUROSEMIDE 40 MG PO TABS: 40.0000 mg | ORAL_TABLET | ORAL | Status: DC | PRN

## 2015-10-14 MED ORDER — METOPROLOL SUCCINATE ER 25 MG PO TB24: 25.0000 mg | ORAL_TABLET | Freq: Every day | ORAL | Status: DC

## 2015-10-14 NOTE — Assessment & Plan Note (Addendum)
CHA2DS2VASC score 3 Surgical maze at the time of previous bypass with biatrial set and clipping of left atrial appendage Recurrent atrial flutter on 2 occasions following surgery (cardioversion early May 2017) His rhythm strip today in the office was reviewed with Dr Gwenlyn Found- it appears to be NSR with frequent PACs and PVCs but Dr Gwenlyn Found suggested EP review this-possible atypical flutter/fibcausing his fatigue

## 2015-10-14 NOTE — Patient Instructions (Signed)
Medication Instructions:  Your physician has recommended you make the following change in your medication:  START over the counter Omeprazole 20mg  daily.   Labwork: Lipid and Cmet today. Please have these drawn at [facility] lab   Testing/Procedures: Your physician has recommended that you wear a holter monitor. Holter monitors are medical devices that record the heart's electrical activity. Doctors most often use these monitors to diagnose arrhythmias. Arrhythmias are problems with the speed or rhythm of the heartbeat. The monitor is a small, portable device. You can wear one while you do your normal daily activities. This is usually used to diagnose what is causing palpitations/syncope (passing out).   Follow-Up: You have been referred to  EP or the Afib Clinic asap  Your physician recommends that you schedule a follow-up appointment in: 3 months with Dr.Jordan    Any Other Special Instructions Will Be Listed Below (If Applicable).     If you need a refill on your cardiac medications before your next appointment, please call your pharmacy.

## 2015-10-14 NOTE — Assessment & Plan Note (Signed)
January 2017 LIMA to LAD, SVG to OM2, SVG to RCA, EVH via bilateral thighs and right lower leg Complete bilateral atrial lesion set via median sternotomy using bipolar radiofrequency and cryothermy ablation with clipping of LA appendage EF 50% with moderate MR May 2017

## 2015-10-14 NOTE — Assessment & Plan Note (Signed)
Xarelto

## 2015-10-14 NOTE — Progress Notes (Signed)
10/14/2015 Peter Bruce   1950-06-10  [id number]  Primary Physician No PCP Per Patient Primary Cardiologist: Dr Martinique  HPI:  65 y/o male followed by Dr Martinique with a history of CABG/MAZE Jan 2017. He says he did "great" after this. On f/u in April he was noted to have atypical atrial flutter. Initially he felt OK with this and declined suggestion of cardioversion by Dr Martinique. He then developed CHF and was admitted and had DCCV 09/11/15. He was discharged and re admitted with respiratory failure and volume overload 09/27/15. His EKG then suggested AF with regular VR, then later read as NSR with PACs. He was diuresed and discharged and is seen in the office today for follow up. Since discharge he has gotten weaker according to the pt and his wife. He is anorexic and "won't eat anything". He is SOB with any exertion. He EKG at first glance appears to be NSR with PVC's and PACs but this was reviewed with Dr Gwenlyn Found who felt it was unusual and may be atypical flutter.    Current Outpatient Prescriptions  Medication Sig Dispense Refill  . amLODipine (NORVASC) 10 MG tablet Take 1 tablet (10 mg total) by mouth daily. (Patient taking differently: Take 10 mg by mouth at bedtime. ) 90 tablet 3  . diazepam (VALIUM) 10 MG tablet TAKE 1 TABLET EVERY 6 HOURS AS NEEDED FOR ANXIETY. (Patient taking differently: Take 10 mg by mouth every 6 (six) hours as needed for anxiety. ) 120 tablet 5  . furosemide (LASIX) 40 MG tablet Take 1 tablet (40 mg total) by mouth as needed for edema. 30 tablet 1  . metoprolol succinate (TOPROL-XL) 25 MG 24 hr tablet Take 1 tablet (25 mg total) by mouth daily. 90 tablet 1  . rivaroxaban (XARELTO) 20 MG TABS tablet Take 1 tablet (20 mg total) by mouth daily with supper. (Patient taking differently: Take 20 mg by mouth daily after supper. ) 30 tablet 11  . temazepam (RESTORIL) 30 MG capsule 1 at 7pm to prepare for bed (Patient taking differently: Take 30 mg by mouth at bedtime. Take at 7pm to  prepare for bed) 30 capsule 5  . zolpidem (AMBIEN) 10 MG tablet Take 1 tablet (10 mg total) by mouth at bedtime. (Patient taking differently: Take 10 mg by mouth at bedtime. 9pm) 30 tablet 5   No current facility-administered medications for this visit.    Allergies  Allergen Reactions  . Amitriptyline Shortness Of Breath  . 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 Other Nausea And Vomiting    SSRI uptake: makes him extremely sick  . Serotonin Reuptake Inhibitors (Ssris) Nausea And Vomiting    SSRI uptake makes him extremely sick.    Social History   Social History  . Marital Status: Married    Spouse Name: N/A  . Number of Children: N/A  . Years of Education: N/A   Occupational History  . licensed Chief Financial Officer     self-employed   Social History Main Topics  . Smoking status: Never Smoker   . Smokeless tobacco: Never Used  . Alcohol Use: No  . Drug Use: No  . Sexual Activity: Not on file   Other Topics Concern  . Not on file   Social History Narrative   Lives with his wife (married 1990).  Former Health visitor. Body building/weight lifting, running for exercise.     Review of Systems: General: negative  for chills, fever, night sweats or weight changes.  Cardiovascular: negative for chest pain, dyspnea on exertion, edema, orthopnea, palpitations, paroxysmal nocturnal dyspnea or shortness of breath Dermatological: negative for rash Respiratory: negative for cough or wheezing Urologic: negative for hematuria Abdominal: negative for nausea, vomiting, diarrhea, bright red blood per rectum, melena, or hematemesis Neurologic: negative for visual changes, syncope, or dizziness All other systems reviewed and are otherwise negative except as noted above.    Blood pressure 130/88, pulse 92, height 5\' 10"  (1.778 m), weight 186 lb 2 oz (84.426 kg).  General appearance: alert, cooperative and no distress Neck:  no carotid bruit and no JVD Lungs: clear to auscultation bilaterally Heart: regular rate and rhythm and extra systole noted Extremities: no edema Skin: pale dry Neurologic: Grossly normal  EKG NSR with PACs/PACs vs atypical flutter  ASSESSMENT AND PLAN:   Fatigue Pt's main complaint is profound fatigue since AF in May 2017. He felt "great" after his CABG in Jan 2017  Paroxysmal atrial fibrillation ([place]) CHA2DS2VASC score 3 Surgical maze at the time of previous bypass with biatrial set and clipping of left atrial appendage Recurrent atrial flutter on 2 occasions following surgery (cardioversion early May 2017) His rhythm strip today in the office was reviewed with Dr Gwenlyn Found- it appears to be NSR with frequent PACs and PVCs but Dr Gwenlyn Found suggested EP review this-possible atypical flutter/fibcausing his fatigue  S/P CABG x 3 and maze procedure January 2017 LIMA to LAD, SVG to OM2, SVG to RCA, EVH via bilateral thighs and right lower leg Complete bilateral atrial lesion set via median sternotomy using bipolar radiofrequency and cryothermy ablation with clipping of LA appendage EF 50% with moderate MR May 2017  Current use of long term anticoagulation Xarelto   PLAN  Reviewed with Dr Gwenlyn Found (office DOD) and Dr Martinique over the phone. Plan to refer him  to EP and will obtain a 48 hr Holter as well. He does not appear to be volume overloaded on exam. If his rhythm is in fact NSR we may need to refer him to internal medicine for further evaluation, he has had a history of PMR and this may be playing a role here.   Kerin Ransom PA-C 10/14/2015 11:06 AM

## 2015-10-14 NOTE — Assessment & Plan Note (Signed)
Pt's main complaint is profound fatigue since AF in May 2017. He felt "great" after his CABG in Jan 2017

## 2015-10-15 ENCOUNTER — Telehealth: Payer: Self-pay | Admitting: *Deleted

## 2015-10-15 ENCOUNTER — Ambulatory Visit (INDEPENDENT_AMBULATORY_CARE_PROVIDER_SITE_OTHER): Payer: Medicare Other | Admitting: Cardiology

## 2015-10-15 ENCOUNTER — Encounter: Payer: Self-pay | Admitting: Cardiology

## 2015-10-15 ENCOUNTER — Ambulatory Visit: Payer: Medicare Other

## 2015-10-15 VITALS — BP 122/88 | HR 89 | Ht 70.0 in | Wt 188.0 lb

## 2015-10-15 DIAGNOSIS — I480 Paroxysmal atrial fibrillation: Secondary | ICD-10-CM

## 2015-10-15 NOTE — Patient Instructions (Signed)
Medication Instructions:  Your physician recommends that you continue on your current medications as directed. Please refer to the Current Medication list given to you today.  Labwork: None ordered  Testing/Procedures: None ordered  Follow-Up: To be determined -- please call the office when you have made a decision on ablation.  If you need a refill on your cardiac medications before your next appointment, please call your pharmacy.  Thank you for choosing [facility]!!   Trinidad Curet, RN [phone]  Any Other Special Instructions Will Be Listed Below (If Applicable). Cardiac Ablation Cardiac ablation is a procedure to disable a small amount of heart tissue in very specific places. The heart has many electrical connections. Sometimes these connections are abnormal and can cause the heart to beat very fast or irregularly. By disabling some of the problem areas, heart rhythm can be improved or made normal. Ablation is done for people who:   Have Wolff-Parkinson-White syndrome.   Have other fast heart rhythms (tachycardia).   Have taken medicines for an abnormal heart rhythm (arrhythmia) that resulted in:   No success.   Side effects.   May have a high-risk heartbeat that could result in death.  LET [facility] CARE PROVIDER KNOW ABOUT:   Any allergies you have or any previous reactions you have had to X-ray dye, food (such as seafood), medicine, or tape.   All medicines you are taking, including vitamins, herbs, eye drops, creams, and over-the-counter medicines.   Previous problems you or members of your family have had with the use of anesthetics.   Any blood disorders you have.   Previous surgeries or procedures (such as a kidney transplant) you have had.   Medical conditions you have (such as kidney failure).  RISKS AND COMPLICATIONS Generally, cardiac ablation is a safe procedure. However, problems can occur and include:   Increased risk of  cancer. Depending on how long it takes to do the ablation, the dose of radiation can be high.  Bruising and bleeding where a thin, flexible tube (catheter) was inserted during the procedure.   Bleeding into the chest, especially into the sac that surrounds the heart (serious).  Need for a permanent pacemaker if the normal electrical system is damaged.   The procedure may not be fully effective, and this may not be recognized for months. Repeat ablation procedures are sometimes required. BEFORE THE PROCEDURE   Follow any instructions from your health care provider regarding eating and drinking before the procedure.   Take your medicines as directed at regular times with water, unless instructed otherwise by your health care provider. If you are taking diabetes medicine, including insulin, ask how you are to take it and if there are any special instructions you should follow. It is common to adjust insulin dosing the day of the ablation.  PROCEDURE  An ablation is usually performed in a catheterization laboratory with the guidance of fluoroscopy. Fluoroscopy is a type of X-ray that helps your health care provider see images of your heart during the procedure.   An ablation is a minimally invasive procedure. This means a small cut (incision) is made in either your neck or groin. Your health care provider will decide where to make the incision based on your medical history and physical exam.  An IV tube will be started before the procedure begins. You will be given an anesthetic or medicine to help you relax (sedative).  The skin on your neck or groin will be numbed. A needle will  be inserted into a large vein in your neck or groin and catheters will be threaded to your heart.  A special dye that shows up on fluoroscopy pictures may be injected through the catheter. The dye helps your health care provider see the area of the heart that needs treatment.  The catheter has electrodes on the  tip. When the area of heart tissue that is causing the arrhythmia is found, the catheter tip will send an electrical current to the area and "scar" the tissue. Three types of energy can be used to ablate the heart tissue:   Heat (radiofrequency energy).   Laser energy.   Extreme cold (cryoablation).   When the area of the heart has been ablated, the catheter will be taken out. Pressure will be held on the insertion site. This will help the insertion site clot and keep it from bleeding. A bandage will be placed on the insertion site.  AFTER THE PROCEDURE   After the procedure, you will be taken to a recovery area where your vital signs (blood pressure, heart rate, and breathing) will be monitored. The insertion site will also be monitored for bleeding.   You will need to lie still for 4-6 hours. This is to ensure you do not bleed from the catheter insertion site.    This information is not intended to replace advice given to you by your health care provider. Make sure you discuss any questions you have with your health care provider.   Document Released: 09/12/2008 Document Revised: 05/17/2014 Document Reviewed: 09/18/2012 Elsevier Interactive Patient Education 2016 [employer].   Sleep Studies A sleep study (polysomnogram) is a series of tests done while you are sleeping. It can show how well you sleep. This can help your health care provider diagnose a sleep disorder and show how severe your sleep disorder is. A sleep study may lead to treatment that will help you sleep better and prevent other medical problems caused by poor sleep. If you have a sleep disorder, you may also be at risk for:   Sleep-related accidents.  High blood pressure.  Heart disease.  Stroke.  Other medical conditions. Sleep disorders are common. Your health care provider may suspect a sleep disorder if you:  Have loud snoring most nights.  Have brief periods when you stop breathing at  night.  Feel sleepy on most days.  Fall asleep suddenly during the day.  Have trouble falling asleep or staying asleep.  Feel like you need to move your legs when trying to fall asleep.  Have dreams that seem very real shortly after falling asleep.  Feel like you cannot move when you first wake up. WHICH TESTS WILL I NEED TO HAVE?  Most sleep studies last all night and include these tests:  Recordings of your brain activity.  Recordings of your eye movements.  Recording of your heart rate and rhythm.  Blood pressure readings.  Readings of the amount of oxygen in your blood.  Measurements of your chest and belly movement as you breathe during sleep. If you have signs of the sleep disorder called sleep apnea during your test, you may get a mask to wear for the second half of the night.   The mask provides continuous positive airway pressure (CPAP). This may improve sleep apnea significantly.  You will then have all tests done again with the mask in place to see if your measurements and recordings change. HOW ARE SLEEP STUDIES DONE? Most sleep studies are done over  one full night of sleep.   You will arrive at the study center in the evening and can go home in the morning.  Bring your pajamas and toothbrush.  Do not have caffeine on the day of your sleep study.  Your health care provider will let you know if you need to stop taking any of your regular medicines before the test. To do the tests included in a polysomnogram, you will have:  Round, sticky patches with sensors attached to recording wires (electrodes) placed on your scalp, face, chest, and limbs.  Wires from all the electrodes and sensors run from your bed to a computer. The wires can be taken off and put back on if you need to get out of bed to go to the bathroom.  A sensor placed over your nose to measure airflow.  A finger clip put on one finger to measure your blood oxygen level.  A belt around your  belly and a belt around your chest to measure breathing movements. WHERE ARE SLEEP STUDIES DONE?  Sleep studies are done at sleep centers. A sleep center may be inside a hospital, office, or clinic.  The room where you have the study may look like a hospital room or a hotel room. The health care providers doing the study may come in and out of the room during the study. Most of the time, they will be in another room monitoring your test.  HOW IS INFORMATION FROM SLEEP STUDIES HELPFUL? A polysomnogram can be used along with your medical history and a physical exam to diagnose conditions, such as:  Sleep apnea.  Restless legs syndrome.  Sleep-related seizure disorders.  Sleep-related movement disorders. A medical doctor who specializes in sleep will evaluate your sleep study. The specialist will share the results with your primary health care provider. Treatments based on your sleep study may include:  Improving your sleep habits (sleep hygiene).  Wearing a CPAP mask.  Wearing an oral device at night to improve breathing and reduce snoring.  Taking medicine for:  Restless legs syndrome.  Sleep-related seizure disorder.  Sleep-related movement disorder.   This information is not intended to replace advice given to you by your health care provider. Make sure you discuss any questions you have with your health care provider.   Document Released: 10/31/2002 Document Revised: 05/17/2014 Document Reviewed: 07/02/2013 Elsevier Interactive Patient Education [employer].

## 2015-10-15 NOTE — Telephone Encounter (Signed)
Patient was scheduled to have a 48 hour holter monitor applied at 11:30 today, after his appointment with Dr. Curt Bruce.  Patient left the office after his appointment with Dr. Curt Bruce, without staying for his holter monitor appointment.  Please contact our office to reschedule your holter monitor appointment.

## 2015-10-15 NOTE — Progress Notes (Signed)
Electrophysiology Office Note   Date:  10/15/2015   ID:  Peter Scholle, DOB 10-27-1950, MRN [id number]  PCP:  No PCP Per Patient  Cardiologist:  Martin[place] Primary Electrophysiologist:  Kamille Toomey Meredith Leeds, MD    Chief Complaint  Patient presents with  . Advice Only  . Atrial Fibrillation     History of Present Illness: Peter Bruce is a 65 y.o. male who presents today for electrophysiology evaluation.   He has a history of CABG and a Maze procedure done and January 2017. He felt well after that procedure. Postprocedure, he was found to be in atypical atrial flutter. At that time he declined cardioversion. He developed CHF and was admitted for cardioversion on 09/11/15. He was readmitted on 09/27/15 with respiratory failure and volume overload. EKG suggested atrial fibrillation, but he converted to sinus rhythm with PACs. He says that he has had significant fatigue. He had his CABG operation in January, and was doing well up until an episode of pneumonia in April. Since that time, he has had significant issues with atrial fibrillation and atypical atrial flutter. He has had cardioversions in the past, but has reverted back out of rhythm. Today he is in sinus rhythm and does complain of fatigue. He is able to exercise with the amount of fatigue he is having is making it difficult to exercise.   Today, he denies symptoms of chest pain,  orthopnea, PND, lower extremity edema, claudication, dizziness, presyncope, syncope, bleeding, or neurologic sequela. The patient is tolerating medications without difficulties and is otherwise without complaint today.    Past Medical History  Diagnosis Date  . Hypertension   . PMR (polymyalgia rheumatica) (HCC)   . Acute pulmonary embolus ([place]) 2010  . Hypercholesterolemia   . Atrial fibrillation [facility])     s/p inpatient DCCV 09/11/2015  . Coronary artery disease involving native coronary artery 05/15/2015    multivessel  . Lupus anticoagulant positive 07/29/2008  .  History of kidney stones   . 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 S/P Maze operation for atrial fibrillation 05/29/2015    Complete bilateral atrial lesion set via median sternotomy using bipolar radiofrequency and cryothermy ablation with clipping of LA appendage  . Atrial flutter [facility])    Past Surgical History  Procedure Laterality Date  . Appendectomy    . Hernia repair    . Shoulder surgery Left     clavicular fracture  . Arm surgery  Right     wrist and elbow  . Cardiac catheterization N/A 05/15/2015    Procedure: Left Heart Cath and Coronary Angiography;  Surgeon: Peter M Martinique, MD;  Location: [place] [facility];  Service: Cardiovascular;  Laterality: N/A;  . Coronary artery bypass graft N/A 05/29/2015    Procedure: CORONARY ARTERY BYPASS GRAFTING (CABG), ON PUMP, TIMES THREE, USING LEFT INTERNAL MAMMARY ARTERY, BILATERAL GREATER SAPHENOUS VEINS HARVESTED ENDOSCOPICALLY;  Surgeon: Rexene Alberts, MD;  Location: [place];  Service: Open Heart Surgery;  Laterality: N/A;  . Maze N/A 05/29/2015    Procedure: MAZE;  Surgeon: Rexene Alberts, MD;  Location: [place];  Service: Open Heart Surgery;  Laterality: N/A;  Complete Bi-Atrial Lesion set with Ablation and Cryothermy  . Tee without cardioversion N/A 05/29/2015    Procedure: TRANSESOPHAGEAL ECHOCARDIOGRAM (TEE);  Surgeon: Rexene Alberts, MD;  Location: [place];  Service: Open Heart Surgery;  Laterality:  N/A;  . Clipping of atrial appendage  05/29/2015    Procedure: CLIPPING OF LEFT ATRIAL APPENDAGE;  Surgeon: Rexene Alberts, MD;  Location: [place];  Service: Open Heart Surgery;;  45 AtriClip PRO 145  . Cardioversion N/A 09/11/2015    Procedure: CARDIOVERSION;  Surgeon: Lelon Perla, MD;  Location: [facility] ENDOSCOPY;  Service: Cardiovascular;  Laterality: N/A;     Current Outpatient Prescriptions  Medication Sig Dispense Refill  . amLODipine (NORVASC) 10 MG tablet Take 10  mg by mouth daily.    . diazepam (VALIUM) 10 MG tablet Take 10 mg by mouth every 6 (six) hours as needed for anxiety.    . furosemide (LASIX) 40 MG tablet Take 1 tablet (40 mg total) by mouth as needed for edema. 30 tablet 1  . metoprolol succinate (TOPROL-XL) 25 MG 24 hr tablet Take 1 tablet (25 mg total) by mouth daily. 90 tablet 1  . rivaroxaban (XARELTO) 20 MG TABS tablet Take 20 mg by mouth daily with supper.    . temazepam (RESTORIL) 30 MG capsule Take 30 mg by mouth at bedtime as needed for sleep.    Marland Kitchen zolpidem (AMBIEN) 10 MG tablet Take 10 mg by mouth at bedtime as needed for sleep.     No current facility-administered medications for this visit.    Allergies:   Amitriptyline; Heparin; Other; and Serotonin reuptake inhibitors (ssris)   Social History:  The patient  reports that he has never smoked. He has never used smokeless tobacco. He reports that he does not drink alcohol or use illicit drugs.   Family History:  The patient's family history includes Cancer in his mother; Other in his sister.    ROS:  Please see the history of present illness.   Otherwise, review of systems is positive for palpitations, fatigue, shortness of breath.   All other systems are reviewed and negative.    PHYSICAL EXAM: VS:  BP 122/88 mmHg  Pulse 89  Ht 5\' 10"  (1.778 m)  Wt 188 lb (85.276 kg)  BMI 26.98 kg/m2 , BMI Body mass index is 26.98 kg/(m^2). GEN: Well nourished, well developed, in no acute distress HEENT: normal Neck: no JVD, carotid bruits, or masses Cardiac: RRR; no murmurs, rubs, or gallops,no edema  Respiratory:  clear to auscultation bilaterally, normal work of breathing GI: soft, nontender, nondistended, + BS MS: no deformity or atrophy Skin: warm and dry Neuro:  Strength and sensation are intact Psych: euthymic mood, full affect  EKG:  EKG is ordered today. The ekg ordered today shows sinus rhythm, 1 degree AV block, nonspecific T wave abnormalities  Recent  Labs: 05/30/2015: Magnesium 2.3 09/19/2015: TSH 1.98 09/27/2015: B Natriuretic Peptide 750.7*; Hemoglobin 13.2; Platelets 196 10/14/2015: ALT 18; BUN 22; Creat 1.25; Potassium 4.1; Sodium 141    Lipid Panel     Component Value Date/Time   CHOL 147 10/14/2015 1127   TRIG 49 10/14/2015 1127   HDL 47 10/14/2015 1127   CHOLHDL 3.1 10/14/2015 1127   VLDL 10 10/14/2015 1127   LDLCALC 90 10/14/2015 1127     Wt Readings from Last 3 Encounters:  10/15/15 188 lb (85.276 kg)  10/14/15 186 lb 2 oz (84.426 kg)  09/28/15 173 lb 9.6 oz (78.744 kg)      Other studies Reviewed: Additional studies/ records that were reviewed today include: TTE 09/09/15  Review of the above records today demonstrates:  - Left ventricle: The cavity size was normal. There was moderate  focal basal hypertrophy of  the septum with mild posterior wall  hypertrophy. Systolic function was normal. The estimated ejection  fraction was in the range of 50% to 55%. Wall motion was normal;  there were no regional wall motion abnormalities. - Aortic valve: Transvalvular velocity was within the normal range.  There was no stenosis. There was no regurgitation. - Mitral valve: Transvalvular velocity was within the normal range.  There was no evidence for stenosis. There was moderate  regurgitation directed posteriorly. - Left atrium: The atrium was moderately dilated. - Right ventricle: The cavity size was normal. Wall thickness was  normal. Systolic function was normal. - Tricuspid valve: There was mild-moderate regurgitation. - Pulmonary arteries: Systolic pressure was severely increased. PA  peak pressure: 67 mm Hg (S). - Inferior vena cava: The vessel was dilated. The respirophasic  diameter changes were blunted (< 50%), consistent with elevated  central venous pressure. - Pericardium, extracardiac: There was a left pleural effusion.    ASSESSMENT AND PLAN:  1.  Atrial fibrillation: Has had multiple  recurrences since his CABG in January. He had a [facility]-Maze procedure at that time. He is having significant fatigue, and appetite changes, which could possibly be related to his atrial fibrillation. I discussed with him the options of medications versus ablation. He says that he did not tolerate amiodarone in the past, and is not, at this time, willing to be in the hospital for loading with dofetilide. I discussed with him the option of ablation for both atrial fibrillation and atypical atrial flutter, occluding the risks and benefits of the procedure. I said that we would have a 65 or so percent chance of getting him to stay in sinus rhythm with ablation. I also discussed with him the option of a sleep study, as he snores, and untreated sleep apnea does make a true fibrillation more difficult to treat. He says that he would like to think about both the options of ablation and a sleep study and Lisandra Mathisen call us back.    Current medicines are reviewed at length with the patient today.   The patient does not have concerns regarding his medicines.  The following changes were made today:  none  Labs/ tests ordered today include:  No orders of the defined types were placed in this encounter.     Disposition:   FU with Zaleigh Bermingham PRN  Signed, Maleko Greulich Meredith Leeds, MD  10/15/2015 10:18 AM     [facility] [address] [place] [address] [phone] (office) [phone] (fax)

## 2015-10-20 DIAGNOSIS — I4891 Unspecified atrial fibrillation: Secondary | ICD-10-CM | POA: Diagnosis not present

## 2015-10-20 DIAGNOSIS — I340 Nonrheumatic mitral (valve) insufficiency: Secondary | ICD-10-CM | POA: Diagnosis not present

## 2015-10-20 DIAGNOSIS — I25119 Atherosclerotic heart disease of native coronary artery with unspecified angina pectoris: Secondary | ICD-10-CM | POA: Diagnosis not present

## 2015-10-20 DIAGNOSIS — R0609 Other forms of dyspnea: Secondary | ICD-10-CM | POA: Diagnosis not present

## 2015-10-22 ENCOUNTER — Other Ambulatory Visit: Payer: Self-pay | Admitting: Cardiology

## 2015-10-22 ENCOUNTER — Ambulatory Visit
Admission: RE | Admit: 2015-10-22 | Discharge: 2015-10-22 | Disposition: A | Discharge status: Home / Self Care | Payer: Medicare Other | Source: Ambulatory Visit | Attending: Cardiology | Admitting: Cardiology

## 2015-10-22 DIAGNOSIS — R0602 Shortness of breath: Secondary | ICD-10-CM

## 2015-10-22 DIAGNOSIS — R05 Cough: Secondary | ICD-10-CM | POA: Diagnosis not present

## 2015-10-22 DIAGNOSIS — R0609 Other forms of dyspnea: Secondary | ICD-10-CM | POA: Diagnosis not present

## 2015-10-23 DIAGNOSIS — I4892 Unspecified atrial flutter: Secondary | ICD-10-CM | POA: Diagnosis not present

## 2015-10-23 DIAGNOSIS — I340 Nonrheumatic mitral (valve) insufficiency: Secondary | ICD-10-CM | POA: Diagnosis not present

## 2015-10-23 DIAGNOSIS — I25119 Atherosclerotic heart disease of native coronary artery with unspecified angina pectoris: Secondary | ICD-10-CM | POA: Diagnosis not present

## 2015-10-23 DIAGNOSIS — I4891 Unspecified atrial fibrillation: Secondary | ICD-10-CM | POA: Diagnosis not present

## 2015-10-29 DIAGNOSIS — R0600 Dyspnea, unspecified: Secondary | ICD-10-CM | POA: Diagnosis not present

## 2015-10-30 ENCOUNTER — Encounter: Payer: Self-pay | Admitting: Thoracic Surgery (Cardiothoracic Vascular Surgery)

## 2015-10-30 ENCOUNTER — Encounter (HOSPITAL_COMMUNITY)
Admission: RE | Disposition: A | Discharge status: Home / Self Care | Payer: Self-pay | Source: Ambulatory Visit | Attending: Cardiology

## 2015-10-30 ENCOUNTER — Encounter (HOSPITAL_COMMUNITY): Payer: Self-pay

## 2015-10-30 ENCOUNTER — Ambulatory Visit (HOSPITAL_COMMUNITY)
Admission: RE | Admit: 2015-10-30 | Discharge: 2015-10-30 | Disposition: A | Discharge status: Home / Self Care | Payer: Medicare Other | Source: Ambulatory Visit | Attending: Cardiology | Admitting: Cardiology

## 2015-10-30 DIAGNOSIS — I10 Essential (primary) hypertension: Secondary | ICD-10-CM

## 2015-10-30 DIAGNOSIS — N183 Chronic kidney disease, stage 3 (moderate): Secondary | ICD-10-CM | POA: Diagnosis not present

## 2015-10-30 DIAGNOSIS — Z951 Presence of aortocoronary bypass graft: Secondary | ICD-10-CM

## 2015-10-30 DIAGNOSIS — R0602 Shortness of breath: Secondary | ICD-10-CM | POA: Diagnosis not present

## 2015-10-30 DIAGNOSIS — I5033 Acute on chronic diastolic (congestive) heart failure: Secondary | ICD-10-CM | POA: Insufficient documentation

## 2015-10-30 DIAGNOSIS — I509 Heart failure, unspecified: Secondary | ICD-10-CM | POA: Diagnosis not present

## 2015-10-30 DIAGNOSIS — I480 Paroxysmal atrial fibrillation: Secondary | ICD-10-CM | POA: Diagnosis not present

## 2015-10-30 DIAGNOSIS — I340 Nonrheumatic mitral (valve) insufficiency: Secondary | ICD-10-CM | POA: Insufficient documentation

## 2015-10-30 DIAGNOSIS — I481 Persistent atrial fibrillation: Secondary | ICD-10-CM | POA: Insufficient documentation

## 2015-10-30 DIAGNOSIS — R0600 Dyspnea, unspecified: Secondary | ICD-10-CM | POA: Diagnosis not present

## 2015-10-30 DIAGNOSIS — R0902 Hypoxemia: Secondary | ICD-10-CM | POA: Diagnosis not present

## 2015-10-30 DIAGNOSIS — I4891 Unspecified atrial fibrillation: Secondary | ICD-10-CM | POA: Diagnosis not present

## 2015-10-30 DIAGNOSIS — I130 Hypertensive heart and chronic kidney disease with heart failure and stage 1 through stage 4 chronic kidney disease, or unspecified chronic kidney disease: Secondary | ICD-10-CM | POA: Diagnosis not present

## 2015-10-30 HISTORY — PX: TEE WITHOUT CARDIOVERSION: SHX5443

## 2015-10-30 HISTORY — DX: Nonrheumatic mitral (valve) insufficiency: I34.0

## 2015-10-30 SURGERY — ECHOCARDIOGRAM, TRANSESOPHAGEAL
Anesthesia: Moderate Sedation

## 2015-10-30 MED ORDER — BUTAMBEN-TETRACAINE-BENZOCAINE 2-2-14 % EX AERO
INHALATION_SPRAY | CUTANEOUS | Status: DC | PRN
  Administered 2015-10-30: 2 via TOPICAL

## 2015-10-30 MED ORDER — MIDAZOLAM HCL 10 MG/2ML IJ SOLN
INTRAMUSCULAR | Status: DC | PRN
  Administered 2015-10-30: 2 mg via INTRAVENOUS

## 2015-10-30 MED ORDER — MIDAZOLAM HCL 5 MG/ML IJ SOLN
INTRAMUSCULAR | Status: AC
Start: 2015-10-30 — End: 2015-10-30
  Filled 2015-10-30: qty 2

## 2015-10-30 MED ORDER — FENTANYL CITRATE (PF) 100 MCG/2ML IJ SOLN
INTRAMUSCULAR | Status: DC | PRN
  Administered 2015-10-30: 12.5 ug via INTRAVENOUS
  Administered 2015-10-30: 25 ug via INTRAVENOUS

## 2015-10-30 MED ORDER — SODIUM CHLORIDE 0.9 % IV SOLN
INTRAVENOUS | Status: DC
  Administered 2015-10-30: 500 mL via INTRAVENOUS

## 2015-10-30 MED ORDER — FENTANYL CITRATE (PF) 100 MCG/2ML IJ SOLN
INTRAMUSCULAR | Status: AC
  Filled 2015-10-30: qty 2

## 2015-10-30 NOTE — Interval H&P Note (Signed)
History and Physical Interval Note:  10/30/2015 7:49 AM  Peter Bruce  has presented today for surgery, with the diagnosis of MITTRAL REGURGITATION  The various methods of treatment have been discussed with the patient and family. After consideration of risks, benefits and other options for treatment, the patient has consented to  Procedure(s): TRANSESOPHAGEAL ECHOCARDIOGRAM (TEE) (N/A) as a surgical intervention .  The patient's history has been reviewed, patient examined, no change in status, stable for surgery.  I have reviewed the patient's chart and labs.  Questions were answered to the patient's satisfaction.     Adrian Prows

## 2015-10-30 NOTE — H&P (Signed)
OFFICE VISIT NOTES COPIED TO EPIC FOR DOCUMENTATION  . History of Present Illness Laverda Page MD; 10/23/2015 7:29 PM) Patient words: FU per Lavone Nian; Last OV 10/20/2015.  The patient is a 65 year old male who presents for a follow-up for Coronary artery disease.  Additional reasons for visit:  Follow-up for Atrial flutter is described as the following: He was 1st evaluated by cardiologist on 04/14/2015 for evaluation of fatigue. He was noted to be in atrial fibrillation by EKG at that time, however has h/o persistent A. Fibrillation since 2013, documented A. Fib since 2016.  Due to marked fatigue as his presenting feature, underwent stress testing on 04/22/2015 which revealed inferior and anteroseptal ischemia and was scheduled for coronary angiogram which revealed severe three-vessel coronary artery disease. On 05/29/2015, he underwent CABG x 3 (LIMA to LAD, SVG to OM2, SVG to RCA) and Maze procedure and clipping of left atrial appendage in January 2017 by Lilly Cove, MD. He had transiently converted to sinus post CABG, but was back in A. Fib few days later (4-5 days post MAZE).  He was a Health visitor in the past. He lost about 25 Lbs during CABG and has not gained it back, but has remained stable since. He had been doing well following CABG until he developed pneumonia in April 2017. Presenting with persistent fatigue. He then developed acute diastolic heart failure and was admitted for management and cardioversion on 09/11/2015 and also 09/27/2015. He has noticed dyspnea on exertion, orthopnea, leg edema worsening since April 2017.  I had seen him on 10/20/2015, discontinued his amlodipine due to leg edema and also due to moderately severe if not severe mitral regurgitation by clinical exam I had started him on valsartan 160 mg by mouth daily. Patient states that his leg edema has improved significantly and his shortness of breath has also slightly improved. Due to persistent cough after I  had seen him, I also prescribed him cough syrup with codeine and he is able to sleep better. I reviewed his recently performed echocardiogram and wanted to further discuss his complex medical issues and consider TEE. He now presents with his wife at the bedside.    He has been on amiodarone in the past which he could not tolerate due to dizziness and is unwilling to be admitted at this time for initiation of dofetilide. He was seen by Dr. Allegra Lai on 10/15/2015 he recommended ablation for atypical A. Flutter/fibrillation as well as evaluation for sleep apnea. He does not want to go through sleep study as he states he is claustrophobic and will not be able to tolerate CPAP.  In the meantime, a Holter monitor was ordered, however patient left without scheduling appointment to place Holter monitor and wanted to think further about undergoing ablation.   Problem List/Past Medical Loletha Grayer [place]; 10/23/2015 12:48 PM) Atherosclerosis of native coronary artery of native heart with angina pectoris (I25.119)  Labwork  10/22/2015: BNP 741 10/14/2015: creatinine 1.25, potassium 4.1, CMP normal, total cholesterol 147, triglycerides 49, HDL 47, LDL 90 09/27/2015: BNP 750.7, CBC normal, creatinine 1.32, eGFR 54, potassium 4.0 S/P CABG x 3 (Z95.1)  LIMA to LAD, SVG to OM2, SVG to RCA; and Maze procedure and clipping of left atrial appendage Atrial fibrillation and flutter (I48.91, I48.92)  Paroxysmal A. Fib and atypical A. Flutter since Jan 2017 S/P Maze and LAA ligation during CABG 05/29/2015. Failure to thrive in adult (R62.7)  Asymptomatic bilateral carotid artery stenosis (S50.53)  Carotid duplex 05/27/2015: Vertebral arteries  patent with antegrade flow. Bilateral 1-39% ICA stenosis. Triphasic pedal artery waveforms. Mitral valve regurgitation, ischemic (I34.0)  Echo 09/09/2015 (personally reviewed) Moderate focal basal hypertrophy of the septum with mild posterior wall hypertrophy. LVEF 50-55%. No regional  wall motion abnormalities. Moderate to severe posteriorly directed MR. MVP. Moderately dilated left atrium. TVP with mild to moderate TR with PA SP 67 mmHg. IVC dilated with blunted respiratory response consistent with elevated CVP. Left pleural effusion. Compared to 04/29/15, MR mild and PA pressure unable to quantify due to absent TR jet. Bilateral leg edema (R60.0)  Dyspnea on exertion (R06.09)  Chest x-ray PA and lateral view 09/27/2015: Cardiomegaly, increased right basilar infiltrate with effusion is noted, interval clearing of left pleural effusion. (personal review) right pleural effusion. Malaise and fatigue (R53.81, R53.83)  Hypertension, benign (I10)  Cough in adult (R05)  BPH (benign prostatic hyperplasia) (N40.0)  History of pulmonary embolus (PE) (Z86.711) 07/2008 Lupus anticoagulant positive (R76.0) 07/29/2008 Pulmonary hypertension, moderate to severe (I27.2) 09/09/2015  Allergies Loletha Grayer [place]; 11-19-15 12:48 PM) Amitriptyline HCl *ANTIDEPRESSANTS*  Shortness of breath. Heparin (Porcine) in D5W *ANTICOAGULANTS*  Nausea, Vomiting. Serotonin HCl *CHEMICALS*  Nausea, Vomiting. Rosuvastatin Calcium *ANTIHYPERLIPIDEMICS*  Dizziness, Nausea. Fatigue Atorvastatin Calcium *ANTIHYPERLIPIDEMICS*  Nausea, Dizziness. Fatigue  Family History Theora Gianotti; 11-19-2015 12:48 PM) Mother  Deceased. at age 21 from Allergic reaction to Rocephin; No known Heart conditions Father  Deceased. at age 2 from Heart Failure; Sister 2  1-Older, CHF; 1-Younger, Arrhythmia  Social History Theora Gianotti; Nov 19, 2015 12:48 PM) Current tobacco use  Never smoker. Non Drinker/No Alcohol Use  Marital status  Married. Number of Children  0. Living Situation  Lives with spouse.  Past Surgical History Theora Gianotti; 2015-11-19 12:48 PM) Appendectomy 1964 Shoulder Surgery 1979 Hernia Repair 1987 Coronary Artery Bypass, Three 05/29/2015 (LIMA to LAD, SVG to OM2, SVG to RCA) Maze  procedure; Clipping of Left Atrial Appendage  Medication History Theora Gianotti; 19-Nov-2015 12:52 PM) Guaifenesin-Codeine (100-10MG/5ML Syrup, 5 Milliliter Oral 1-2 teaspoonful 3-4 times daily for cough as needed, Taken starting 10/21/2015) Active. Valsartan (160MG Tablet, 1 (one) Tablet Oral daily, Taken starting 10/20/2015) Active. (Discontinue Amlodipine) Metoprolol Succinate ER (25MG Tablet ER 24HR, 1 Oral daily) Active. Furosemide (20MG Tablet, 2 Oral daily) Active. Zolpidem Tartrate (10MG Tablet, 1 Oral at bedtime) Active. Pravastatin Sodium (20MG Tablet, 1 Oral daily) Active. (Pt has not started yet.) Xarelto (20MG Tablet, 1 Oral daily) Active. Medications Reconciled (verbally with patient)  Diagnostic Studies History Theora Gianotti; 11-19-2015 12:53 PM) Nuclear stress test 04/22/2015 Defect 1: There is a small defect of moderate severity present in the mid inferior and apical inferior location. Defect 2: There is a medium defect of mild severity present in the mid anteroseptal location. Echocardiogram 04/29/2015 LVEF 65-70%, moderate LVH. Normal biatrial size. Normal IVC. Coronary Angiogram 05/15/2015 Mid RCA lesion, 70% stenosed. Mid RCA to Dist RCA lesion, 85% stenosed. Acute Mrg lesion, 95% stenosed. Prox LAD to Mid LAD lesion, 90% stenosed. Mid LAD lesion, 100% stenosed. 1st Diag lesion, 95% stenosed. Ost 1st Mrg lesion, 80% stenosed. Mid Cx lesion, 90% stenosed. 3rd Mrg lesion, 85% stenosed. The left ventricular systolic function is normal. Carotid Doppler 05/27/2015 Vertebral arteries patent with antegrade flow. Bilateral 1-39% ICA stenosis. Triphasic pedal artery waveforms. TEE 05/29/2015 LVEF 50-55%. Trivial AI. Mild MR. No ASD or PFO. No evidence of thrombus and atrial cavity or appendage. Cardioversion 09/2015 Echocardiogram 09/09/2015 Moderate focal basal hypertrophy of the septum with mild posterior wall hypertrophy. LVEF 50-55%. No regional wall motion abnormalities.  Moderate posteriorly directed MR. Moderately dilated left atrium. Mild to moderate TR with PA SP 67 mmHg. IVC dilated with blunted respiratory response consistent with elevated CVP. Left pleural effusion. Chest X-ray 10/22/2015    Review of Systems Laverda Page MD; 10/23/2015 7:29 PM) General Present- Fatigue. Not Present- Anorexia and Fever. Respiratory Present- Cough (improved on codeine cough syrup), Decreased Exercise Tolerance and Difficulty Breathing on Exertion. Not Present- Hemoptysis, Snoring, Sputum Production and Wakes up from Sleep Wheezing or Short of Breath. Cardiovascular Not Present- Chest Pain, Claudications, Edema and Palpitations. Gastrointestinal Not Present- Black, Tarry Stool, Change in Bowel Habits and Nausea. Neurological Not Present- Focal Neurological Symptoms and Syncope. Endocrine Not Present- Cold Intolerance, Excessive Sweating, Heat Intolerance and Thyroid Problems. Hematology Not Present- Anemia, Easy Bruising, Petechiae and Prolonged Bleeding.  Vitals Loletha Grayer [place]; 10/23/2015 12:55 PM) 10/23/2015 12:49 PM Weight: 196.19 lb Height: 69in Body Surface Area: 2.05 m Body Mass Index: 28.97 kg/m  Pulse: 85 (Regular)  P.OX: 91% (Room air) BP: 124/82 (Sitting, Left Arm, Standard)       Physical Exam Laverda Page, MD; 10/23/2015 7:33 PM) General Mental Status-Alert. General Appearance-Cooperative and Appears stated age. Build & Nutrition-Moderately built.  Head and Neck Thyroid Gland Characteristics - normal size and consistency and no palpable nodules.  Chest and Lung Exam Chest and lung exam reveals -quiet, even and easy respiratory effort with no use of accessory muscles, non-tender and on auscultation, normal breath sounds, no adventitious sounds.  Cardiovascular Cardiovascular examination reveals -carotid auscultation reveals no bruits, abdominal aorta auscultation reveals no bruits and no prominent pulsation, femoral  artery auscultation bilaterally reveals normal pulses, no bruits, no thrills, normal pedal pulses bilaterally and no digital clubbing, cyanosis, edema, increased warmth or tenderness. Auscultation Rhythm - Regular. Heart Sounds - S1 Diminished intensity and S2 Increased intensity. Murmurs & Other Heart Sounds: Murmur - Location - Apex. Timing - Holosystolic. Grade - III/VI. Radiation - Left axilla.  Abdomen Palpation/Percussion Normal exam - Non Tender and No hepatosplenomegaly.  Neurologic Neurologic evaluation reveals -alert and oriented x 3 with no impairment of recent or remote memory. Motor-Grossly intact without any focal deficits.  Musculoskeletal - Did not examine.   Results Laverda Page MD; 10/23/2015 7:33 PM) Labs  Name Value Range Date ASSAY, NATIURETIC PEPTIDE (14431)   Collected: 10/22/2015 2:28 PM B-Type Natriuretic Peptide 741.0 pg/mL 0.0-100.0 pg/mL Collected: 10/22/2015 2:28 PM    Assessment & Plan Laverda Page MD; 10/23/2015 7:34 PM) Acute on chronic diastolic heart failure (V40.08) Story: Chest x-ray PA and lateral view 09/27/2015: Cardiomegaly, increased right basilar infiltrate with effusion is noted, interval clearing of left pleural effusion. (personal review) right pleural effusion. Atherosclerosis of native coronary artery of native heart with angina pectoris (I25.119) Mitral valve regurgitation, ischemic (I34.0) Story: Echo 09/09/2015 (personally reviewed) Moderate focal basal hypertrophy of the septum with mild posterior wall hypertrophy. LVEF 50-55%. No regional wall motion abnormalities. Moderate to severe posteriorly directed MR. MVP. Moderately dilated left atrium. TVP with mild to moderate TR with PA SP 67 mmHg. IVC dilated with blunted respiratory response consistent with elevated CVP. Left pleural effusion. Compared to 04/29/15, MR mild and PA pressure unable to quantify due to absent TR jet. Atrial fibrillation and flutter  (I48.91) Story: Paroxysmal A. Fib and atypical A. Flutter since Jan 2017 S/P Maze and LAA ligation during CABG 05/29/2015. Impression: EKG 10/23/2015: Atrial fibrillation with controlled response at the rate of 77 bpm, low voltage complexes, nonspecific T abnormality.  EKG 10/20/2015: Atypical  atrial flutter with controlled ventricular response at the rate of 86 bpm, rightward axis, poor R-wave progression, cannot exclude anterior infarct old. Low-voltage complexes. Nonspecific T abnormality. Normal QT interval. Future Plans 10/28/2015: TSH (THYROID STIMULATING HORMONE) (12458) - one time Bilateral leg edema (R60.0) Pulmonary hypertension, moderate to severe (I27.2) S/P CABG x 3 (Z95.1) Story: LIMA to LAD, SVG to OM2, SVG to RCA; and Maze procedure and clipping of left atrial appendage Failure to thrive in adult (R62.7)  Labwork Story: 10/22/2015: BNP 741  10/14/2015: creatinine 1.25, potassium 4.1, CMP normal, total cholesterol 147, triglycerides 49, HDL 47, LDL 90  09/27/2015: BNP 750.7, CBC normal, creatinine 1.32, eGFR 54, potassium 4.0   Current Plans Mechanism of underlying disease process and action of medications discussed with the patient. I discussed primary/secondary prevention and also dietary counceling was done. I have discussed the results of the echocardiogram that I personally reviewed, I'm concerned about severe pulmonary hypertension that is new and also I suspect his MR is probably much severe that reported. Presently on furosemide and Valsartan combination, symptoms of heart failure have improved, patient states that for the first time he has started to feel slightly better.  Overall due to patient's medical status which is significantly changed, I have recommended proceeding with TEE to better define his mitral valve anatomy. I'll make further recommendation after this. No changes in the medications were done today. I'll continue to make cautious addition and medication changes  in this patient with extremely complex medical issues. All patient's questions and his wife's questions were answered. Schedule for TEE to better evaluate the structural abnormality. I have explained risks, benefits,alternatives to TEE, including but not limited to rare esophageal perforation, bleeding, aspiration pneumonia. If the heart failure symptoms do not improve on conservative therapy, we may have to consider pleurocentesis for symptom relief.  CC: Dr. Tami Lin.  Signed by Laverda Page, MD (10/23/2015 7:34 PM)

## 2015-10-30 NOTE — Discharge Instructions (Signed)

## 2015-10-30 NOTE — CV Procedure (Signed)
TEE: Under moderate sedation, TEE was performed without complications: LV: Normal. Normal EF. RV: NormalL  LA: Enlarged/ LAA ligated during CABG. Inter atrial septum is intact without defect.  RA: Normal  MV: Myxomatous. Incomplete coaption with prolapse of the anterior MV leaflet with severe posteriorly directed MR. TV: Normal  Mild TR, no pul HTN  AV: Normal. Trace AI. PV: Normal. Trace PI.  Thoracic and ascending aorta: Mild  atheromatous changes.  Conscious sedation protocol was followed, I personally administered conscious sedation and monitored the patient. Patient received 2 milligrams of Versed and 37.5 MCG Fentanyl  . Patient tolerated the procedure well and there was no complication from conscious sedation. Time administered was 30 minutes.

## 2015-10-30 NOTE — Progress Notes (Signed)
  Echocardiogram Echocardiogram Transesophageal has been performed.  Peter Bruce 10/30/2015, 8:58 AM

## 2015-10-31 ENCOUNTER — Encounter: Payer: Self-pay | Admitting: Cardiology

## 2015-10-31 LAB — ECHO TEE
PISA EROA: 0.52 cm2
VTI: 129 cm

## 2015-11-01 ENCOUNTER — Encounter (HOSPITAL_COMMUNITY): Payer: Self-pay | Admitting: Emergency Medicine

## 2015-11-01 ENCOUNTER — Other Ambulatory Visit: Payer: Self-pay

## 2015-11-01 ENCOUNTER — Emergency Department (HOSPITAL_COMMUNITY): Payer: Medicare Other

## 2015-11-01 ENCOUNTER — Inpatient Hospital Stay (HOSPITAL_COMMUNITY)
Admission: EM | Admit: 2015-11-01 | Discharge: 2015-11-06 | DRG: 291 | Disposition: A | Discharge status: Home Health | Payer: Medicare Other | Attending: Cardiology | Admitting: Cardiology

## 2015-11-01 DIAGNOSIS — Z951 Presence of aortocoronary bypass graft: Secondary | ICD-10-CM

## 2015-11-01 DIAGNOSIS — I340 Nonrheumatic mitral (valve) insufficiency: Secondary | ICD-10-CM | POA: Diagnosis present

## 2015-11-01 DIAGNOSIS — I509 Heart failure, unspecified: Secondary | ICD-10-CM

## 2015-11-01 DIAGNOSIS — Z86711 Personal history of pulmonary embolism: Secondary | ICD-10-CM

## 2015-11-01 DIAGNOSIS — R0902 Hypoxemia: Secondary | ICD-10-CM | POA: Diagnosis present

## 2015-11-01 DIAGNOSIS — J90 Pleural effusion, not elsewhere classified: Secondary | ICD-10-CM

## 2015-11-01 DIAGNOSIS — Z9109 Other allergy status, other than to drugs and biological substances: Secondary | ICD-10-CM | POA: Diagnosis not present

## 2015-11-01 DIAGNOSIS — I5033 Acute on chronic diastolic (congestive) heart failure: Secondary | ICD-10-CM | POA: Diagnosis present

## 2015-11-01 DIAGNOSIS — E785 Hyperlipidemia, unspecified: Secondary | ICD-10-CM | POA: Diagnosis present

## 2015-11-01 DIAGNOSIS — R0602 Shortness of breath: Secondary | ICD-10-CM | POA: Diagnosis not present

## 2015-11-01 DIAGNOSIS — M797 Fibromyalgia: Secondary | ICD-10-CM | POA: Diagnosis present

## 2015-11-01 DIAGNOSIS — N183 Chronic kidney disease, stage 3 (moderate): Secondary | ICD-10-CM | POA: Diagnosis present

## 2015-11-01 DIAGNOSIS — I341 Nonrheumatic mitral (valve) prolapse: Secondary | ICD-10-CM | POA: Diagnosis present

## 2015-11-01 DIAGNOSIS — I130 Hypertensive heart and chronic kidney disease with heart failure and stage 1 through stage 4 chronic kidney disease, or unspecified chronic kidney disease: Principal | ICD-10-CM | POA: Diagnosis present

## 2015-11-01 DIAGNOSIS — M353 Polymyalgia rheumatica: Secondary | ICD-10-CM | POA: Diagnosis present

## 2015-11-01 DIAGNOSIS — Z79899 Other long term (current) drug therapy: Secondary | ICD-10-CM | POA: Diagnosis not present

## 2015-11-01 DIAGNOSIS — Z7901 Long term (current) use of anticoagulants: Secondary | ICD-10-CM | POA: Diagnosis not present

## 2015-11-01 DIAGNOSIS — R0682 Tachypnea, not elsewhere classified: Secondary | ICD-10-CM | POA: Diagnosis not present

## 2015-11-01 DIAGNOSIS — I10 Essential (primary) hypertension: Secondary | ICD-10-CM | POA: Diagnosis not present

## 2015-11-01 DIAGNOSIS — I25119 Atherosclerotic heart disease of native coronary artery with unspecified angina pectoris: Secondary | ICD-10-CM | POA: Diagnosis present

## 2015-11-01 DIAGNOSIS — I480 Paroxysmal atrial fibrillation: Secondary | ICD-10-CM | POA: Diagnosis present

## 2015-11-01 HISTORY — DX: Pneumonia, unspecified organism: J18.9

## 2015-11-01 HISTORY — DX: Heart failure, unspecified: I50.9

## 2015-11-01 HISTORY — DX: Cardiac arrhythmia, unspecified: I49.9

## 2015-11-01 LAB — COMPREHENSIVE METABOLIC PANEL
ALBUMIN: 4 g/dL (ref 3.5–5.0)
ALK PHOS: 50 U/L (ref 38–126)
ALT: 28 U/L (ref 17–63)
ANION GAP: 9 (ref 5–15)
AST: 32 U/L (ref 15–41)
BILIRUBIN TOTAL: 1.9 mg/dL — AB (ref 0.3–1.2)
BUN: 36 mg/dL — AB (ref 6–20)
CALCIUM: 9 mg/dL (ref 8.9–10.3)
CO2: 25 mmol/L (ref 22–32)
Chloride: 107 mmol/L (ref 101–111)
Creatinine, Ser: 1.52 mg/dL — ABNORMAL HIGH (ref 0.61–1.24)
GFR calc Af Amer: 53 mL/min — ABNORMAL LOW (ref >60)
GFR, EST NON AFRICAN AMERICAN: 46 mL/min — AB (ref >60)
GLUCOSE: 120 mg/dL — AB (ref 65–99)
Potassium: 3.6 mmol/L (ref 3.5–5.1)
Sodium: 141 mmol/L (ref 135–145)
TOTAL PROTEIN: 6.6 g/dL (ref 6.5–8.1)

## 2015-11-01 LAB — CBC WITH DIFFERENTIAL/PLATELET
BASOS PCT: 1 %
Basophils Absolute: 0 10*3/uL (ref 0.0–0.1)
Eosinophils Absolute: 0.1 10*3/uL (ref 0.0–0.7)
Eosinophils Relative: 1 %
HEMATOCRIT: 41.7 % (ref 39.0–52.0)
HEMOGLOBIN: 14.4 g/dL (ref 13.0–17.0)
LYMPHS PCT: 12 %
Lymphs Abs: 1 10*3/uL (ref 0.7–4.0)
MCH: 31.5 pg (ref 26.0–34.0)
MCHC: 34.5 g/dL (ref 30.0–36.0)
MCV: 91.2 fL (ref 78.0–100.0)
MONO ABS: 0.8 10*3/uL (ref 0.1–1.0)
MONOS PCT: 9 %
NEUTROS ABS: 6.5 10*3/uL (ref 1.7–7.7)
NEUTROS PCT: 77 %
Platelets: 132 10*3/uL — ABNORMAL LOW (ref 150–400)
RBC: 4.57 MIL/uL (ref 4.22–5.81)
RDW: 15.6 % — AB (ref 11.5–15.5)
WBC: 8.4 10*3/uL (ref 4.0–10.5)

## 2015-11-01 LAB — I-STAT TROPONIN, ED: TROPONIN I, POC: 0.02 ng/mL (ref 0.00–0.08)

## 2015-11-01 LAB — BRAIN NATRIURETIC PEPTIDE: B Natriuretic Peptide: 1152.5 pg/mL — ABNORMAL HIGH (ref 0.0–100.0)

## 2015-11-01 MED ORDER — FUROSEMIDE 10 MG/ML IJ SOLN: 20.0000 mg | Freq: Two times a day (BID) | INTRAMUSCULAR | Status: DC

## 2015-11-01 MED ORDER — FUROSEMIDE 10 MG/ML IJ SOLN
60.0000 mg | Freq: Once | INTRAMUSCULAR | Status: AC
  Administered 2015-11-01: 60 mg via INTRAVENOUS
  Filled 2015-11-01: qty 8

## 2015-11-01 MED ORDER — IRBESARTAN 75 MG PO TABS
37.5000 mg | ORAL_TABLET | Freq: Every day | ORAL | Status: DC
  Administered 2015-11-02 – 2015-11-06 (×5): 37.5 mg via ORAL
  Filled 2015-11-01 (×5): qty 1

## 2015-11-01 MED ORDER — TEMAZEPAM 15 MG PO CAPS
30.0000 mg | ORAL_CAPSULE | Freq: Every evening | ORAL | Status: DC | PRN
  Administered 2015-11-02: 30 mg via ORAL
  Filled 2015-11-01: qty 2

## 2015-11-01 MED ORDER — SODIUM CHLORIDE 0.9 % IV SOLN: 250.0000 mL | INTRAVENOUS | Status: DC | PRN

## 2015-11-01 MED ORDER — ALBUTEROL SULFATE (2.5 MG/3ML) 0.083% IN NEBU: 5.0000 mg | INHALATION_SOLUTION | Freq: Once | RESPIRATORY_TRACT | Status: DC

## 2015-11-01 MED ORDER — FUROSEMIDE 40 MG PO TABS: 40.0000 mg | ORAL_TABLET | ORAL | Status: DC | PRN

## 2015-11-01 MED ORDER — FUROSEMIDE 10 MG/ML IJ SOLN
20.0000 mg | Freq: Two times a day (BID) | INTRAMUSCULAR | Status: DC
  Administered 2015-11-02: 20 mg via INTRAVENOUS
  Filled 2015-11-01: qty 2

## 2015-11-01 MED ORDER — METOPROLOL SUCCINATE ER 25 MG PO TB24
25.0000 mg | ORAL_TABLET | Freq: Every day | ORAL | Status: DC
  Administered 2015-11-02 – 2015-11-06 (×5): 25 mg via ORAL
  Filled 2015-11-01 (×5): qty 1

## 2015-11-01 MED ORDER — NITROGLYCERIN IN D5W 200-5 MCG/ML-% IV SOLN
20.0000 ug/min | Freq: Once | INTRAVENOUS | Status: AC
  Administered 2015-11-01: 20 ug/min via INTRAVENOUS
  Filled 2015-11-01: qty 250

## 2015-11-01 MED ORDER — RIVAROXABAN 20 MG PO TABS
20.0000 mg | ORAL_TABLET | Freq: Every day | ORAL | Status: DC
  Administered 2015-11-02 – 2015-11-05 (×4): 20 mg via ORAL
  Filled 2015-11-01 (×4): qty 1

## 2015-11-01 MED ORDER — DIAZEPAM 5 MG PO TABS: 10.0000 mg | ORAL_TABLET | Freq: Four times a day (QID) | ORAL | Status: DC | PRN

## 2015-11-01 MED ORDER — SODIUM CHLORIDE 0.9% FLUSH
3.0000 mL | Freq: Two times a day (BID) | INTRAVENOUS | Status: DC
  Administered 2015-11-03: 3 mL via INTRAVENOUS
  Administered 2015-11-04: 10 mL via INTRAVENOUS
  Administered 2015-11-05 – 2015-11-06 (×3): 3 mL via INTRAVENOUS

## 2015-11-01 MED ORDER — ONDANSETRON HCL 4 MG/2ML IJ SOLN: 4.0000 mg | Freq: Four times a day (QID) | INTRAMUSCULAR | Status: DC | PRN

## 2015-11-01 MED ORDER — ZOLPIDEM TARTRATE 5 MG PO TABS
10.0000 mg | ORAL_TABLET | Freq: Every evening | ORAL | Status: DC | PRN
Start: 2015-11-01 — End: 2015-11-06
  Administered 2015-11-02 – 2015-11-04 (×4): 10 mg via ORAL
  Filled 2015-11-01 (×4): qty 2

## 2015-11-01 MED ORDER — SODIUM CHLORIDE 0.9% FLUSH: 3.0000 mL | INTRAVENOUS | Status: DC | PRN

## 2015-11-01 MED ORDER — ACETAMINOPHEN 325 MG PO TABS
650.0000 mg | ORAL_TABLET | ORAL | Status: DC | PRN
Start: 2015-11-01 — End: 2015-11-06
  Administered 2015-11-05: 650 mg via ORAL
  Filled 2015-11-01: qty 2

## 2015-11-01 NOTE — ED Provider Notes (Signed)
CSN: [id number]     Arrival date & time 11/01/15  1726 History   First MD Initiated Contact with Patient 11/01/15 1731     Chief Complaint  Patient presents with  . Shortness of Breath  . Weakness     (Consider location/radiation/quality/duration/timing/severity/associated sxs/prior Treatment) HPI Comments: 65 year old male with history of congestive heart failure, recent diagnosis of mitral regurgitation presents for shortness of breath. The patient states that he saw Dr. Everitt Amber last week and had a TEE done which revealed might troll regurg. He's been having severe shortness of breath and has been unable to lay flat at home. He said he has continued to have worsening shortness of breath. He said he has not been able to sleep because he cannot lay down. He has also been having distention of his abdomen as well as swelling in his legs. He just received oxygen at home today. He has been on it all day without improvement. Today he was so weak he could not stand and so his wife brought him into the emergency department. He was supposed to be evaluated on Monday for possible surgical intervention for his valve.  Patient is a 65 y.o. male presenting with shortness of breath and weakness.  Shortness of Breath Associated symptoms: no abdominal pain, no chest pain, no cough, no fever, no headaches, no rash, no vomiting and no wheezing   Weakness Associated symptoms include shortness of breath. Pertinent negatives include no chest pain, no abdominal pain and no headaches.    Past Medical History  Diagnosis Date  . Hypertension   . PMR (polymyalgia rheumatica) (HCC)   . Acute pulmonary embolus ([place]) 2010  . Hypercholesterolemia   . Atrial fibrillation [facility])     s/p inpatient DCCV 09/11/2015  . Coronary artery disease involving native coronary artery 05/15/2015    multivessel  . Lupus anticoagulant positive 07/29/2008  . History of kidney stones   . 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 S/P Maze operation for atrial fibrillation 05/29/2015    Complete bilateral atrial lesion set via median sternotomy using bipolar radiofrequency and cryothermy ablation with clipping of LA appendage  . Atrial flutter ([place])   . Mitral regurgitation 10/30/2015   Past Surgical History  Procedure Laterality Date  . Appendectomy    . Hernia repair    . Shoulder surgery Left     clavicular fracture  . Arm surgery  Right     wrist and elbow  . Cardiac catheterization N/A 05/15/2015    Procedure: Left Heart Cath and Coronary Angiography;  Surgeon: Peter M Martinique, MD;  Location: [place] CV [facility];  Service: Cardiovascular;  Laterality: N/A;  . Coronary artery bypass graft N/A 05/29/2015    Procedure: CORONARY ARTERY BYPASS GRAFTING (CABG), ON PUMP, TIMES THREE, USING LEFT INTERNAL MAMMARY ARTERY, BILATERAL GREATER SAPHENOUS VEINS HARVESTED ENDOSCOPICALLY;  Surgeon: Rexene Alberts, MD;  Location: [place];  Service: Open Heart Surgery;  Laterality: N/A;  . Maze N/A 05/29/2015    Procedure: MAZE;  Surgeon: Rexene Alberts, MD;  Location: [place];  Service: Open Heart Surgery;  Laterality: N/A;  Complete Bi-Atrial Lesion set with Ablation and Cryothermy  . Tee without cardioversion N/A 05/29/2015    Procedure: TRANSESOPHAGEAL ECHOCARDIOGRAM (TEE);  Surgeon: Rexene Alberts, MD;  Location: [place];  Service: Open Heart Surgery;  Laterality: N/A;  . Clipping of atrial  appendage  05/29/2015    Procedure: CLIPPING OF LEFT ATRIAL APPENDAGE;  Surgeon: Rexene Alberts, MD;  Location: [place];  Service: Open Heart Surgery;;  45 AtriClip PRO 145  . Cardioversion N/A 09/11/2015    Procedure: CARDIOVERSION;  Surgeon: Lelon Perla, MD;  Location: [facility] ENDOSCOPY;  Service: Cardiovascular;  Laterality: N/A;  . Tee without cardioversion N/A 10/30/2015    Procedure: TRANSESOPHAGEAL ECHOCARDIOGRAM (TEE);  Surgeon: Adrian Prows, MD;  Location: [facility] ENDOSCOPY;  Service:  Cardiovascular;  Laterality: N/A;   Family History  Problem Relation Age of Onset  . Cancer Mother   . Other Sister     chf   Social History  Substance Use Topics  . Smoking status: Never Smoker   . Smokeless tobacco: Never Used  . Alcohol Use: No    Review of Systems  Constitutional: Negative for fever, chills, appetite change and fatigue.  HENT: Negative for congestion, postnasal drip and rhinorrhea.   Eyes: Negative for visual disturbance.  Respiratory: Positive for shortness of breath. Negative for cough, chest tightness and wheezing.   Cardiovascular: Positive for leg swelling. Negative for chest pain and palpitations.  Gastrointestinal: Positive for abdominal distention. Negative for nausea, vomiting, abdominal pain and diarrhea.  Genitourinary: Negative for dysuria, urgency and hematuria.  Musculoskeletal: Negative for myalgias and back pain.  Skin: Negative for rash.  Neurological: Positive for weakness (generalized). Negative for dizziness, tremors and headaches.  Hematological: Bruises/bleeds easily (xarelto).      Allergies  Amitriptyline; Heparin; Other; and Serotonin reuptake inhibitors (ssris)  Home Medications   Prior to Admission medications   Medication Sig Start Date End Date Taking? Authorizing Provider  diazepam (VALIUM) 10 MG tablet Take 10 mg by mouth every 6 (six) hours as needed for anxiety.   Yes Historical Provider, MD  furosemide (LASIX) 40 MG tablet Take 1 tablet (40 mg total) by mouth as needed for edema. 10/14/15  Yes Luke K Kilroy, PA-C  metoprolol succinate (TOPROL-XL) 25 MG 24 hr tablet Take 1 tablet (25 mg total) by mouth daily. 10/14/15  Yes Luke K Kilroy, PA-C  rivaroxaban (XARELTO) 20 MG TABS tablet Take 20 mg by mouth daily with supper.   Yes Historical Provider, MD  temazepam (RESTORIL) 30 MG capsule Take 30 mg by mouth at bedtime as needed for sleep.   Yes Historical Provider, MD  valsartan (DIOVAN) 160 MG tablet Take 160 mg by mouth  daily. 10/20/15  Yes Historical Provider, MD  zolpidem (AMBIEN) 10 MG tablet Take 10 mg by mouth at bedtime as needed for sleep.   Yes Historical Provider, MD   BP 114/81 mmHg  Pulse 87  Temp(Src) 97.7 F (36.5 C) (Oral)  Resp 18  Ht 5\' 10"  (1.778 m)  Wt 188 lb (85.276 kg)  BMI 26.98 kg/m2  SpO2 95% Physical Exam  Constitutional: He is oriented to person, place, and time. He appears well-developed and well-nourished. No distress.  HENT:  Head: Normocephalic and atraumatic.  Right Ear: External ear normal.  Left Ear: External ear normal.  Mouth/Throat: Oropharynx is clear and moist. No oropharyngeal exudate.  Eyes: EOM are normal. Pupils are equal, round, and reactive to light.  Neck: Normal range of motion. Neck supple.  Cardiovascular: Normal rate, regular rhythm, normal heart sounds and intact distal pulses.   No murmur heard. Pulmonary/Chest: Effort normal. No respiratory distress. He has no wheezes. He has rales.  Abdominal: Soft. He exhibits distension (liver feels pulsatile). There is no tenderness.  Musculoskeletal: He exhibits edema (  1+ pitting edema of the bilateral lower extremities).  Neurological: He is alert and oriented to person, place, and time.  Skin: Skin is warm and dry. No rash noted. He is not diaphoretic.  Vitals reviewed.   ED Course  Procedures (including critical care time)  CRITICAL CARE Performed by: Earlie Server   Total critical care time: 35 minutes  Critical care time was exclusive of separately billable procedures and treating other patients.  Critical care was necessary to treat or prevent imminent or life-threatening deterioration.  Critical care was time spent personally by me on the following activities: development of treatment plan with patient and/or surrogate as well as nursing, discussions with consultants, evaluation of patient's response to treatment, examination of patient, obtaining history from patient or surrogate, ordering  and performing treatments and interventions, ordering and review of laboratory studies, ordering and review of radiographic studies, pulse oximetry and re-evaluation of patient's condition.  Labs Review Labs Reviewed  CBC WITH DIFFERENTIAL/PLATELET - Abnormal; Notable for the following:    RDW 15.6 (*)    Platelets 132 (*)    All other components within normal limits  COMPREHENSIVE METABOLIC PANEL - Abnormal; Notable for the following:    Glucose, Bld 120 (*)    BUN 36 (*)    Creatinine, Ser 1.52 (*)    Total Bilirubin 1.9 (*)    GFR calc non Af Amer 46 (*)    GFR calc Af Amer 53 (*)    All other components within normal limits  BRAIN NATRIURETIC PEPTIDE - Abnormal; Notable for the following:    B Natriuretic Peptide 1152.5 (*)    All other components within normal limits  I-STAT TROPOININ, ED    Imaging Review Dg Chest 2 View  11/01/2015  CLINICAL DATA:  65 year old male with a history of weakness, shortness of breath, congestive heart failure. Recent a diagnosis of mitral regurgitation EXAM: CHEST  2 VIEW COMPARISON:  10/22/2015, 09/27/2015 FINDINGS: Cardiomediastinal silhouette unchanged with persisting cardiomegaly. Surgical changes of prior median sternotomy and CABG. Surgical changes of left atrial amputation. Worsening airspace disease in the right lung extending from the hilum to the periphery. Pleural parenchymal thickening with obscuration the right hemidiaphragm and blunting of the right costophrenic angle. Right heart border is obscured. Left lung remains relatively well aerated. Lateral view demonstrates significant airspace disease. IMPRESSION: Worsening right-sided airspace opacity and pleural fluid. Given the known history of mitral regurgitation, this may represent asymmetric edema with associated pleural effusion. Infection not excluded however, and the effusion may be parapneumonic. Correlation with presentation, lab values, and the severity of mitral regurgitation may be  useful. Surgical changes of prior median sternotomy and CABG. Surgical changes of left atrial amputation. Signed, Dulcy Fanny. Earleen Newport, DO [facility] Electronically Signed   By: Corrie Mckusick D.O.   On: 11/01/2015 18:51   I have personally reviewed and evaluated these images and lab results as part of my medical decision-making.   EKG Interpretation   Date/Time:  Saturday November 01 2015 17:50:53 EDT Ventricular Rate:  83 PR Interval:    QRS Duration: 108 QT Interval:  435 QTC Calculation: 512 R Axis:   -81 Text Interpretation:  Sinus rhythm Ventricular premature complex Prolonged  PR interval Low voltage, extremity leads Prolonged QT interval PVCs but no  other significant changes compared to previous Confirmed by NGUYEN, EMILY  [id number]) on 11/01/2015 6:09:26 PM      MDM  Patient was seen and evaluated at bedside  with wife present. Patient with recent diagnosis of mitral regurgitation. Patient appears to be in acute CHF. Laboratory results with elevated BNP and chest x-ray with significant pulmonary edema. Patient was hypoxic before being put on nasal cannula. He was given 60 mg of IV Lasix with some mild improvement. I discussed case with Dr. Everitt Amber who recommended patient be admitted and transferred over to [facility] He also recommended the patient be started on a nitro drip at 20 mcg.  I discussed all recommendations of Dr. Vladimir Crofts G's and plan of care with patient who expressed understanding and agreement.  Final diagnoses:  Acute congestive heart failure, unspecified congestive heart failure type ([place])    1. Acute CHF  2. hypoxia    Harvel Quale, MD 11/01/15 2023

## 2015-11-01 NOTE — ED Notes (Signed)
Patient transported to X-ray 

## 2015-11-01 NOTE — ED Notes (Signed)
Per EMS, patient reports increased lethargy/weakness; also, increased shortness of breath with exertion. Difficulty breathing while flat on back. Lung sounds were clear, but diminished in lower lobes. Patient is on 2 L at home. Patient's saturations was 88% on 2 L [place]. Placed on 3 L [place], with oxygen saturation 93% Hx of CHF and diagnosed with "mitral valve regurgitation" a few days ago. Patient is from home.

## 2015-11-01 NOTE — ED Notes (Signed)
[facility] was notified of pt's transfer to [facility].

## 2015-11-01 NOTE — ED Notes (Signed)
Bed: [id number] Expected date:  Expected time:  Means of arrival:  Comments: EMS- weakness, SOB, hx of CHF

## 2015-11-01 NOTE — ED Notes (Signed)
[facility] here to transport pt to [facility]. 

## 2015-11-02 LAB — BASIC METABOLIC PANEL
ANION GAP: 8 (ref 5–15)
BUN: 29 mg/dL — ABNORMAL HIGH (ref 6–20)
CHLORIDE: 105 mmol/L (ref 101–111)
CO2: 27 mmol/L (ref 22–32)
Calcium: 8.8 mg/dL — ABNORMAL LOW (ref 8.9–10.3)
Creatinine, Ser: 1.47 mg/dL — ABNORMAL HIGH (ref 0.61–1.24)
GFR calc Af Amer: 55 mL/min — ABNORMAL LOW (ref >60)
GFR, EST NON AFRICAN AMERICAN: 48 mL/min — AB (ref >60)
GLUCOSE: 115 mg/dL — AB (ref 65–99)
POTASSIUM: 3.3 mmol/L — AB (ref 3.5–5.1)
Sodium: 140 mmol/L (ref 135–145)

## 2015-11-02 LAB — COMPREHENSIVE METABOLIC PANEL
ALT: 24 U/L (ref 17–63)
AST: 29 U/L (ref 15–41)
Albumin: 3.2 g/dL — ABNORMAL LOW (ref 3.5–5.0)
Alkaline Phosphatase: 45 U/L (ref 38–126)
Anion gap: 9 (ref 5–15)
BILIRUBIN TOTAL: 2 mg/dL — AB (ref 0.3–1.2)
BUN: 30 mg/dL — AB (ref 6–20)
CHLORIDE: 106 mmol/L (ref 101–111)
CO2: 27 mmol/L (ref 22–32)
CREATININE: 1.56 mg/dL — AB (ref 0.61–1.24)
Calcium: 8.9 mg/dL (ref 8.9–10.3)
GFR calc Af Amer: 51 mL/min — ABNORMAL LOW (ref >60)
GFR, EST NON AFRICAN AMERICAN: 44 mL/min — AB (ref >60)
Glucose, Bld: 98 mg/dL (ref 65–99)
Potassium: 3.3 mmol/L — ABNORMAL LOW (ref 3.5–5.1)
Sodium: 142 mmol/L (ref 135–145)
Total Protein: 5.7 g/dL — ABNORMAL LOW (ref 6.5–8.1)

## 2015-11-02 LAB — MRSA PCR SCREENING: MRSA BY PCR: NEGATIVE

## 2015-11-02 LAB — BRAIN NATRIURETIC PEPTIDE: B NATRIURETIC PEPTIDE 5: 1099.2 pg/mL — AB (ref 0.0–100.0)

## 2015-11-02 MED ORDER — NITROGLYCERIN IN D5W 200-5 MCG/ML-% IV SOLN: 0.0000 ug/min | INTRAVENOUS | Status: DC

## 2015-11-02 MED ORDER — POTASSIUM CHLORIDE ER 10 MEQ PO TBCR
10.0000 meq | EXTENDED_RELEASE_TABLET | Freq: Two times a day (BID) | ORAL | Status: DC
  Administered 2015-11-02 (×2): 10 meq via ORAL
  Filled 2015-11-02 (×5): qty 1

## 2015-11-02 MED ORDER — SPIRONOLACTONE 25 MG PO TABS
25.0000 mg | ORAL_TABLET | Freq: Every day | ORAL | Status: DC
  Administered 2015-11-02 – 2015-11-06 (×5): 25 mg via ORAL
  Filled 2015-11-02 (×5): qty 1

## 2015-11-02 MED ORDER — AMIODARONE HCL 200 MG PO TABS
400.0000 mg | ORAL_TABLET | Freq: Two times a day (BID) | ORAL | Status: DC
  Administered 2015-11-02 (×2): 400 mg via ORAL
  Administered 2015-11-03: 200 mg via ORAL
  Administered 2015-11-03: 400 mg via ORAL
  Filled 2015-11-02 (×5): qty 2

## 2015-11-02 MED ORDER — FUROSEMIDE 10 MG/ML IJ SOLN
20.0000 mg | Freq: Two times a day (BID) | INTRAMUSCULAR | Status: DC
  Administered 2015-11-02 – 2015-11-06 (×7): 20 mg via INTRAVENOUS
  Filled 2015-11-02 (×7): qty 2

## 2015-11-02 NOTE — H&P (Signed)
Peter Bruce is an 65 y.o. male.   Chief Complaint: Shortness of breath HPI: Peter Bruce  is a 65 y.o. male  Severe MR diagnosed recently due to worsening dyspnea and abnormal physical exam. Patient had been doing relatively well until 2 days ago came acutely short of breath even at rest. His wife activated EMS and patient was brought to the [facility] emergency room, chest x-ray revealing pulmonary edema, patient also has.significant leg edema, he was then transferred to [facility] for further management and evaluation. This morning he states that he feels better but still short of breath, has orthopnea. Denies any chest pain or palpitations.  He was 1st evaluated by cardiologist on 04/14/2015 for evaluation of fatigue. He was noted to be in atrial fibrillation by EKG at that time, however has h/o persistent A. Fibrillation since 2013, documented A. Fib since 2016.  Due to marked fatigue as his presenting feature, underwent stress testing on 04/22/2015 which revealed inferior and anteroseptal ischemia and was scheduled for coronary angiogram which revealed severe three-vessel coronary artery disease. On 05/29/2015, he underwent CABG x 3 (LIMA to LAD, SVG to OM2, SVG to RCA) and Maze procedure and clipping of left atrial appendage in January 2017 by Lilly Cove, MD. He had transiently converted to sinus post CABG, but was back in A. Fib few days later (4-5 days post MAZE).  He was a Health visitor in the past. He lost about 25 Lbs during CABG and has not gained it back, but has remained stable since.   He had been doing well following CABG until he developed pneumonia in April 2017. Presenting with persistent fatigue. He then developed acute diastolic heart failure and was admitted for management and cardioversion on 09/11/2015. Patient has had recurrent episodes of diastolic heart failure. He did undergo cardio version and has maintained sinus rhythm mostly, with paroxysmal episodes of atrial  fibrillation. He underwent TEE on 10/30/2015 which confirmed severe MR.  Past Medical History  Diagnosis Date  . Hypertension   . PMR (polymyalgia rheumatica) (HCC)   . Acute pulmonary embolus ([place]) 2010  . Hypercholesterolemia   . Atrial fibrillation [facility])     s/p inpatient DCCV 09/11/2015  . Coronary artery disease involving native coronary artery 05/15/2015    multivessel  . Lupus anticoagulant positive 07/29/2008  . History of kidney stones   . 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 S/P Maze operation for atrial fibrillation 05/29/2015    Complete bilateral atrial lesion set via median sternotomy using bipolar radiofrequency and cryothermy ablation with clipping of LA appendage  . Atrial flutter ([place])   . Mitral regurgitation 10/30/2015  . Dysrhythmia     A-fib  . CHF (congestive heart failure) ([place])   . Pneumonia   . Arthritis     Past Surgical History  Procedure Laterality Date  . Appendectomy    . Hernia repair    . Shoulder surgery Left     clavicular fracture  . Arm surgery  Right     wrist and elbow  . Cardiac catheterization N/A 05/15/2015    Procedure: Left Heart Cath and Coronary Angiography;  Surgeon: Peter M Martinique, MD;  Location: [facility] CV [facility];  Service: Cardiovascular;  Laterality: N/A;  . Coronary artery bypass graft N/A 05/29/2015    Procedure: CORONARY ARTERY BYPASS GRAFTING (CABG), ON PUMP, TIMES THREE, USING LEFT  INTERNAL MAMMARY ARTERY, BILATERAL GREATER SAPHENOUS VEINS HARVESTED ENDOSCOPICALLY;  Surgeon: Rexene Alberts, MD;  Location: [place];  Service: Open Heart Surgery;  Laterality: N/A;  . Maze N/A 05/29/2015    Procedure: MAZE;  Surgeon: Rexene Alberts, MD;  Location: [place];  Service: Open Heart Surgery;  Laterality: N/A;  Complete Bi-Atrial Lesion set with Ablation and Cryothermy  . Tee without cardioversion N/A 05/29/2015    Procedure: TRANSESOPHAGEAL ECHOCARDIOGRAM (TEE);   Surgeon: Rexene Alberts, MD;  Location: [place];  Service: Open Heart Surgery;  Laterality: N/A;  . Clipping of atrial appendage  05/29/2015    Procedure: CLIPPING OF LEFT ATRIAL APPENDAGE;  Surgeon: Rexene Alberts, MD;  Location: [place];  Service: Open Heart Surgery;;  45 AtriClip PRO 145  . Cardioversion N/A 09/11/2015    Procedure: CARDIOVERSION;  Surgeon: Lelon Perla, MD;  Location: [facility] ENDOSCOPY;  Service: Cardiovascular;  Laterality: N/A;  . Tee without cardioversion N/A 10/30/2015    Procedure: TRANSESOPHAGEAL ECHOCARDIOGRAM (TEE);  Surgeon: Adrian Prows, MD;  Location: [facility] ENDOSCOPY;  Service: Cardiovascular;  Laterality: N/A;  . Fracture surgery      right wrist    Family History  Problem Relation Age of Onset  . Cancer Mother   . Other Sister     chf   Social History:  reports that he has never smoked. He has never used smokeless tobacco. He reports that he does not drink alcohol or use illicit drugs.  Allergies:  Allergies  Allergen Reactions  . Amitriptyline Shortness Of Breath  . 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 Other Nausea And Vomiting    SSRI uptake: makes him extremely sick  . Serotonin Reuptake Inhibitors (Ssris) Nausea And Vomiting    SSRI uptake makes him extremely sick.    Review of Systems - Negative except Marked fatigue, shortness of breath, leg edema  Blood pressure 105/84, pulse 83, temperature 97.7 F (36.5 C), temperature source Oral, resp. rate 27, height 5\' 10"  (1.778 m), weight 86.183 kg (190 lb), SpO2 98 %. General appearance: alert, cooperative, appears stated age, fatigued and mild distress Eyes: negative findings: lids and lashes normal Neck: no adenopathy, no carotid bruit, supple, symmetrical, trachea midline, thyroid not enlarged, symmetric, no tenderness/mass/nodules and JVD present up to the angle of the jaw Neck: JVP - normal, carotids 2+= without bruits Resp: Absent breath sounds right  base, faint crackles left base. Chest wall: no tenderness Cardio: S1 is now for, S2 is normal, there is a long systolic murmur at the apex conducted to the axilla. Grade 3/6. No gallop appreciated. GI: Mild hepatomegaly present. Otherwise abdomen is nontender. Bowel sounds heard in all 4 quadrants. Extremities: edema 2+ bilateral below knee, pitting Pulses: 2+ and symmetric Skin: Skin color, texture, turgor normal. No rashes or lesions Neurologic: Grossly normal   Dg Chest 2 View  11/01/2015  CLINICAL DATA:  66 year old male with a history of weakness, shortness of breath, congestive heart failure. Recent a diagnosis of mitral regurgitation EXAM: CHEST  2 VIEW COMPARISON:  10/22/2015, 09/27/2015 FINDINGS: Cardiomediastinal silhouette unchanged with persisting cardiomegaly. Surgical changes of prior median sternotomy and CABG. Surgical changes of left atrial amputation. Worsening airspace disease in the right lung extending from the hilum to the periphery. Pleural parenchymal thickening with obscuration the right hemidiaphragm and blunting of the right costophrenic angle. Right heart border is obscured. Left lung remains relatively well aerated. Lateral view demonstrates  significant airspace disease. IMPRESSION: Worsening right-sided airspace opacity and pleural fluid. Given the known history of mitral regurgitation, this may represent asymmetric edema with associated pleural effusion. Infection not excluded however, and the effusion may be parapneumonic. Correlation with presentation, lab values, and the severity of mitral regurgitation may be useful. Surgical changes of prior median sternotomy and CABG. Surgical changes of left atrial amputation. Signed, Dulcy Fanny. Earleen Newport, DO [facility] Electronically Signed   By: Corrie Mckusick D.O.   On: 11/01/2015 18:51   Labs:   Lab Results  Component Value Date   WBC 8.4 11/01/2015   HGB 14.4 11/01/2015    HCT 41.7 11/01/2015   MCV 91.2 11/01/2015   PLT 132* 11/01/2015    Recent Labs Lab 11/02/15 0628  NA 142  K 3.3*  CL 106  CO2 27  BUN 30*  CREATININE 1.56*  CALCIUM 8.9  PROT 5.7*  BILITOT 2.0*  ALKPHOS 45  ALT 24  AST 29  GLUCOSE 98    Lipid Panel     Component Value Date/Time   CHOL 147 10/14/2015 1127   TRIG 49 10/14/2015 1127   HDL 47 10/14/2015 1127   CHOLHDL 3.1 10/14/2015 1127   VLDL 10 10/14/2015 1127   LDLCALC 90 10/14/2015 1127    Recent Labs  09/27/15 1010 11/01/15 1819 11/02/15 0628  BNP 750.7* 1152.5* 1099.2*    HEMOGLOBIN A1C Lab Results  Component Value Date   HGBA1C 5.5 05/27/2015   MPG 111 05/27/2015    Recent Labs  11/13/14 1643 09/19/15 1139  TSH 1.505 1.98    EKG: 11/01/2015: Sinus rhythm with first-degree AV block, Normal axis, poor progression, cannot exclude anterior infarct old. PVC. QT interval slightly prolonged at 480 ms.  Filed: 10/30/2015 8:42 AM Note Time: 10/30/2015 8:36 AM Status: Signed    Editor: Adrian Prows, MD (Physician)     Expand All Collapse All   TEE: Under moderate sedation, TEE was performed without complications: LV: Normal. Normal EF. RV: NormalL  LA: Enlarged/ LAA ligated during CABG. Inter atrial septum is intact without defect.  RA: Normal  MV: Myxomatous. Incomplete coaption with prolapse of the anterior MV leaflet with severe posteriorly directed MR. TV: Normal Mild TR, no pul HTN  AV: Normal. Trace AI. PV: Normal. Trace PI.  Thoracic and ascending aorta: Mild atheromatous changes.       Medications Prior to Admission  Medication Sig Dispense Refill  . diazepam (VALIUM) 10 MG tablet Take 10 mg by mouth every 6 (six) hours as needed for anxiety.    . furosemide (LASIX) 40 MG tablet Take 1 tablet (40 mg total) by mouth as needed for edema. 30 tablet 1  . metoprolol succinate (TOPROL-XL) 25 MG 24 hr tablet Take 1 tablet (25 mg total) by mouth daily. 90 tablet 1  . rivaroxaban  (XARELTO) 20 MG TABS tablet Take 20 mg by mouth daily with supper.    . temazepam (RESTORIL) 30 MG capsule Take 30 mg by mouth at bedtime as needed for sleep.    . valsartan (DIOVAN) 160 MG tablet Take 160 mg by mouth daily.    Marland Kitchen zolpidem (AMBIEN) 10 MG tablet Take 10 mg by mouth at bedtime as needed for sleep.      Current facility-administered medications:  .  0.9 %  sodium chloride infusion, 250 mL, Intravenous, PRN, Adrian Prows, MD .  acetaminophen (TYLENOL) tablet 650 mg, 650 mg, Oral, Q4H PRN, Adrian Prows, MD .  amiodarone (PACERONE) tablet 400 mg, 400 mg, Oral, BID, Adrian Prows, MD .  diazepam (VALIUM) tablet 10 mg, 10 mg, Oral, Q6H PRN, Adrian Prows, MD .  furosemide (LASIX) injection 20 mg, 20 mg, Intravenous, Q12H, Adrian Prows, MD, 20 mg at 11/02/15 [id number] .  furosemide (LASIX) tablet 40 mg, 40 mg, Oral, PRN, Adrian Prows, MD .  irbesartan (AVAPRO) tablet 37.5 mg, 37.5 mg, Oral, Daily, Adrian Prows, MD, 37.5 mg at 11/02/15 0933 .  metoprolol succinate (TOPROL-XL) 24 hr tablet 25 mg, 25 mg, Oral, Daily, Adrian Prows, MD, 25 mg at 11/02/15 0931 .  nitroGLYCERIN 50 mg in dextrose 5 % 250 mL (0.2 mg/mL) infusion, 0-200 mcg/min, Intravenous, Titrated, Adrian Prows, MD, 20 mcg/min at 11/02/15 0630 .  ondansetron (ZOFRAN) injection 4 mg, 4 mg, Intravenous, Q6H PRN, Adrian Prows, MD .  potassium chloride (K-DUR) CR tablet 10 mEq, 10 mEq, Oral, BID, Adrian Prows, MD, 10 mEq at 11/02/15 0931 .  rivaroxaban (XARELTO) tablet 20 mg, 20 mg, Oral, Q supper, Adrian Prows, MD .  sodium chloride flush (NS) 0.9 % injection 3 mL, 3 mL, Intravenous, Q12H, Adrian Prows, MD, 3 mL at 11/01/15 2300 .  sodium chloride flush (NS) 0.9 % injection 3 mL, 3 mL, Intravenous, PRN, Adrian Prows, MD .  spironolactone (ALDACTONE) tablet 25 mg, 25 mg, Oral, Daily, Adrian Prows, MD .  temazepam (RESTORIL) capsule 30 mg, 30 mg, Oral, QHS PRN, Adrian Prows, MD .  zolpidem (AMBIEN) tablet 10 mg, 10 mg, Oral, QHS PRN, Adrian Prows, MD, 10 mg at 11/02/15  0034  Assessment/Plan 1. Respiratory distress secondary to acute pulmonary edema from severe mitral regurgitation. Mitral prolapse with involvement of the anterior mitral leaflet. Acute on chronic diastolic heart failure. 2. History of paroxysmal atrial fibrillation and atrial flutter, S/P Maze procedure and left atrial appendage closure during CABG, presently on Xarelto. Continue the same. CHA2DS2-VASCScore: Risk Score  3.0  Yearly risk of stroke  3.2   3. Atherosclerosis of native coronary artery of native heart with angina pectoris S/P CABG x 3 on 05/29/2015 with LIMA to LAD, SVG to OM2, SVG to RCA; and Maze procedure and clipping of left atrial appendage. 4. History of pulmonary embolism, bilateral in 2010, spontaneous,  Lupus anticoagulant positive. 5. Hypertension 6. History of fibromyalgia 7. Hyperlipidemia - patient has not been able to tolerate statins. Lipid profile 2015: Total cholesterol 218, triglycerides 99, HDL 50, LDL 148. And on 10/16/2015: Total cholesterol 147, triglycerides 49, HDL 47, LDL 90.  Recommendation: Patient is responding well to intravenous diuretics. Continue the same for now, add amiodarone to reduce the risk of recurrence of atrial fibrillation as he will need repeat thoracotomy for mitral valve repair. Patient is in acute decompensated heart failure and hence not stable for either performing coronary angiography and right heart catheterization or for further surgical intervention at this point until he is more stable. I have had discussions with Dr. Darylene Price and also Servando Snare, M.D. regarding further management, unless we cannot hold off on surgery, he preferred to wait till Dr. Roxy Manns is able to operate by lateral thoracotomy approach. Once he is clinically stable, I would consider left and right heart catheterization prior to his surgery. I have added Aldactone to his present medical regimen. I called his wife and discussed with her regarding all the options. I will  add a low dose of simvastatin at 20 mg due to statin intolerance and for cardio-vascular protection.  Adrian Prows, MD 11/02/2015, 11:27 AM [facility].  [place] Pager: [phone] Office: [phone] If no answer: Cell:  [phone]

## 2015-11-03 ENCOUNTER — Ambulatory Visit: Payer: Medicare Other | Admitting: Cardiology

## 2015-11-03 ENCOUNTER — Inpatient Hospital Stay (HOSPITAL_COMMUNITY): Payer: Medicare Other

## 2015-11-03 LAB — BRAIN NATRIURETIC PEPTIDE: B Natriuretic Peptide: 777.1 pg/mL — ABNORMAL HIGH (ref 0.0–100.0)

## 2015-11-03 LAB — BASIC METABOLIC PANEL
Anion gap: 9 (ref 5–15)
BUN: 25 mg/dL — AB (ref 6–20)
CHLORIDE: 105 mmol/L (ref 101–111)
CO2: 27 mmol/L (ref 22–32)
Calcium: 8.8 mg/dL — ABNORMAL LOW (ref 8.9–10.3)
Creatinine, Ser: 1.5 mg/dL — ABNORMAL HIGH (ref 0.61–1.24)
GFR calc Af Amer: 54 mL/min — ABNORMAL LOW (ref >60)
GFR calc non Af Amer: 46 mL/min — ABNORMAL LOW (ref >60)
GLUCOSE: 94 mg/dL (ref 65–99)
POTASSIUM: 3.3 mmol/L — AB (ref 3.5–5.1)
Sodium: 141 mmol/L (ref 135–145)

## 2015-11-03 MED ORDER — POTASSIUM CHLORIDE ER 10 MEQ PO TBCR
20.0000 meq | EXTENDED_RELEASE_TABLET | Freq: Two times a day (BID) | ORAL | Status: DC
  Administered 2015-11-03 – 2015-11-04 (×3): 20 meq via ORAL
  Filled 2015-11-03 (×6): qty 2

## 2015-11-03 MED ORDER — ISOSORBIDE MONONITRATE ER 60 MG PO TB24
60.0000 mg | ORAL_TABLET | Freq: Every day | ORAL | Status: DC
  Administered 2015-11-03 – 2015-11-06 (×4): 60 mg via ORAL
  Filled 2015-11-03 (×4): qty 1

## 2015-11-03 NOTE — Care Management Note (Addendum)
Case Management Note  Patient Details  Name: Mila Birner MRN: [id number] Date of Birth: 17-Sep-1950  Subjective/Objective: Pt admitted for CHF exacerbation. Pt is from home with wife. Plan will be to return home once stable. CM was able to speak with wife over the phone in regards to disposition needs. Husband and wife both agreeable to services. Choice provided and wife had used [place] in the past and wanted to utilize them again. Pt has DME 02 at home that he uses at night. If pt will need new liter flow for continuous 02 pt will need new order to be placed. No further needs from CM at this time.                     Action/Plan: CM did make referral with [facility] [place] [facility]). SOC to begin within 24-48 hours post d/c. Consult was placed with [facility] as well for community resources.  No further needs from CM at this time.    Expected Discharge Date:                  Expected Discharge Plan:  [place]  In-House Referral:  NA  Discharge planning Services  CM Consult  Post Acute Care Choice:  Home Health Choice offered to:  Patient, Spouse  DME Arranged:  N/A DME Agency:  NA  HH Arranged:  RN [place] Agency:  [facility] (now [facility])  Status of Service:  Completed, signed off  If discussed at [employer] of Stay Meetings, dates discussed:    Additional Comments:  Bethena Roys, RN 11/03/2015, 4:16 PM

## 2015-11-03 NOTE — Progress Notes (Signed)
Patient stated upon morning assessment "If I could jump out that window I would" Patient was then asked are you having thoughts of hurting yourself or others. Patients response was " oh no just frustrated and ready to get out of here". Suicide risk assessment was completed and MD paged to notify.

## 2015-11-03 NOTE — Care Management Important Message (Signed)
Important Message  Patient Details  Name: Peter Bruce MRN: [id number] Date of Birth: 02-06-1951   Medicare Important Message Given:  Yes    Nathen May 11/03/2015, 10:44 AM

## 2015-11-03 NOTE — Discharge Instructions (Signed)

## 2015-11-03 NOTE — Progress Notes (Signed)
Subjective:  Feels much better, dyspnea better, was able to lay down flat without dyspnea. No cough.   Objective:  Vital Signs in the last 24 hours: Temp:  [97.8 F (36.6 C)-98.9 F (37.2 C)] 98.7 F (37.1 C) (06/26 0746) Pulse Rate:  [67-82] 67 (06/26 0600) Resp:  [0-22] 19 (06/26 0800) BP: (103-135)/(77-100) 135/100 mmHg (06/26 0800) SpO2:  [91 %-97 %] 97 % (06/26 0600) Weight:  [86.047 kg (189 lb 11.2 oz)] 86.047 kg (189 lb 11.2 oz) (06/26 0420)  Blood pressure 135/100, pulse 67, temperature 98.7 F (37.1 C), temperature source Oral, resp. rate 19, height 5\' 10"  (1.778 m), weight 86.047 kg (189 lb 11.2 oz), SpO2 97 %. General appearance: alert, cooperative, appears stated age, fatigued and mild distress Eyes: negative findings: lids and lashes normal Neck: no adenopathy, no carotid bruit, supple, symmetrical, trachea midline, thyroid not enlarged, symmetric, no tenderness/mass/nodules and JVD present up to the angle of the jaw Neck: JVP elevated below angle of jaw - improved. carotids 2+= without bruits Resp: Improved breath sounds right base, faint crackles left base. Chest wall: no tenderness Cardio: S1 is now for, S2 is normal, there is a long systolic murmur at the apex conducted to the axilla. Grade 3/6. No gallop appreciated. GI: Mild hepatomegaly present. Otherwise abdomen is nontender. Bowel sounds heard in all 4 quadrants. Extremities: edema 2+ bilateral below knee, pitting. Improved significantly compared to yesterday Pulses: 2+ and symmetric  Intake/Output from previous day: 06/25 0701 - 06/26 0700 In: 660 [P.O.:660] Out: 1475 [Urine:1475]  Recent Labs  11/02/15 0328 11/02/15 0628 11/03/15 0521  NA 140 142 141  K 3.3* 3.3* 3.3*  CL 105 106 105  CO2 27 27 27   GLUCOSE 115* 98 94  BUN 29* 30* 25*  CREATININE 1.47* 1.56* 1.50*  CALCIUM 8.8* 8.9 8.8*  GFRNONAA 48* 44* 46*  GFRAA 55* 51* 54*   Recent Labs Lab 11/01/15 1819  WBC 8.4  RBC 4.57  HGB 14.4   HCT 41.7  PLT 132*  MCV 91.2  MCH 31.5  MCHC 34.5  RDW 15.6*  LYMPHSABS 1.0  MONOABS 0.8  EOSABS 0.1  BASOSABS 0.0    HEMOGLOBIN A1C Lab Results  Component Value Date   HGBA1C 5.5 05/27/2015   MPG 111 05/27/2015    Recent Labs  11/13/14 1643 09/19/15 1139  TSH 1.505 1.98    Recent Labs  10/14/15 1127 11/01/15 1819 11/02/15 0628  PROT 6.5 6.6 5.7*  ALBUMIN 3.9 4.0 3.2*  AST 20 32 29  ALT 18 28 24   ALKPHOS 54 50 45  BILITOT 1.0 1.9* 2.0*   Cardiac Studies:   EKG: 11/01/2015: Sinus rhythm with first-degree AV block, Normal axis, poor progression, cannot exclude anterior infarct old. PVC. QT interval slightly prolonged at 480 ms.  Filed: 10/30/2015 8:42 AM Note Time: 10/30/2015 8:36 AM Status: Signed    Editor: Adrian Prows, MD (Physician)     Expand All Collapse All  TEE: Under moderate sedation, TEE was performed without complications: LV: Normal. Normal EF. RV: NormalL  LA: Enlarged/ LAA ligated during CABG. Inter atrial septum is intact without defect.  RA: Normal  MV: Myxomatous. Incomplete coaption with prolapse of the anterior MV leaflet with severe posteriorly directed MR. TV: Normal Mild TR, no pul HTN  AV: Normal. Trace AI. PV: Normal. Trace PI.  Thoracic and ascending aorta: Mild atheromatous changes.         Assessment/Plan:   Assessment/Plan 1. Respiratory distress secondary to acute pulmonary edema  from severe mitral regurgitation. Mitral prolapse with involvement of the anterior mitral leaflet. Acute on chronic diastolic heart failure. 2. History of paroxysmal atrial fibrillation and atrial flutter, S/P Maze procedure and left atrial appendage closure during CABG, presently on Xarelto. Continue the same. CHA2DS2-VASCScore: Risk Score 3.0 Yearly risk of stroke 3.2   3. Atherosclerosis of native coronary artery of native heart with angina pectoris S/P CABG x 3 on 05/29/2015 with LIMA to LAD, SVG to OM2, SVG to RCA;  and Maze procedure and clipping of left atrial appendage. 4. History of pulmonary embolism, bilateral in 2010, spontaneous, Lupus anticoagulant positive. 5. Hypertension 6. History of fibromyalgia 7. Hyperlipidemia - patient has not been able to tolerate statins. Lipid profile 2015: Total cholesterol 218, triglycerides 99, HDL 50, LDL 148. And on 10/16/2015: Total cholesterol 147, triglycerides 49, HDL 47, LDL 90  8.  Chronic renal insufficiency stage III  Probably secondary to hypertension with hypertensive heart disease and congestive heart failure contributing.  Rec: patient is symptomatically much improved.   Patient getting agitated being in the hospital, I think that if he stabilizes medically, I will discharge him home with home health care on a daily basis with close monitoring both  with  Home health nurse and office visits on a frequent basis both in the outpatient setting in my office and HHN.  Serum creatinine is remained stable in spite of addition of Aldactone.  replace potassium. I have discussed with the patient's wife regarding his disposition,, she is in agreement. I will perform coronary angiography on an elective way probably next week to reduce the risk of contrast nephropathy especially in view of acute decompensated heart failure.  Adrian Prows, M.D. 11/03/2015, 9:47 AM [facility] Pager: [phone] Office: [phone] If no answer: [phone]

## 2015-11-04 LAB — BASIC METABOLIC PANEL
Anion gap: 6 (ref 5–15)
BUN: 26 mg/dL — AB (ref 6–20)
CHLORIDE: 105 mmol/L (ref 101–111)
CO2: 30 mmol/L (ref 22–32)
CREATININE: 1.53 mg/dL — AB (ref 0.61–1.24)
Calcium: 9.1 mg/dL (ref 8.9–10.3)
GFR calc non Af Amer: 45 mL/min — ABNORMAL LOW (ref >60)
GFR, EST AFRICAN AMERICAN: 53 mL/min — AB (ref >60)
Glucose, Bld: 108 mg/dL — ABNORMAL HIGH (ref 65–99)
POTASSIUM: 3.9 mmol/L (ref 3.5–5.1)
Sodium: 141 mmol/L (ref 135–145)

## 2015-11-04 LAB — MAGNESIUM: MAGNESIUM: 2.2 mg/dL (ref 1.7–2.4)

## 2015-11-04 LAB — BRAIN NATRIURETIC PEPTIDE: B NATRIURETIC PEPTIDE 5: 1020.1 pg/mL — AB (ref 0.0–100.0)

## 2015-11-04 MED ORDER — HALOPERIDOL 0.5 MG PO TABS
0.5000 mg | ORAL_TABLET | Freq: Two times a day (BID) | ORAL | Status: DC
  Filled 2015-11-04 (×4): qty 1

## 2015-11-04 MED ORDER — FUROSEMIDE 10 MG/ML IJ SOLN
40.0000 mg | Freq: Once | INTRAMUSCULAR | Status: AC
  Administered 2015-11-04: 40 mg via INTRAVENOUS
  Filled 2015-11-04: qty 4

## 2015-11-04 MED ORDER — POTASSIUM CHLORIDE ER 10 MEQ PO TBCR
20.0000 meq | EXTENDED_RELEASE_TABLET | Freq: Three times a day (TID) | ORAL | Status: DC
  Administered 2015-11-05 – 2015-11-06 (×4): 20 meq via ORAL
  Filled 2015-11-04 (×10): qty 2

## 2015-11-04 MED ORDER — POTASSIUM CHLORIDE ER 10 MEQ PO TBCR
20.0000 meq | EXTENDED_RELEASE_TABLET | Freq: Once | ORAL | Status: AC
  Administered 2015-11-04: 20 meq via ORAL
  Filled 2015-11-04 (×2): qty 2

## 2015-11-04 MED ORDER — DIAZEPAM 5 MG PO TABS
10.0000 mg | ORAL_TABLET | Freq: Every evening | ORAL | Status: DC
  Administered 2015-11-04 – 2015-11-05 (×2): 10 mg via ORAL
  Filled 2015-11-04 (×2): qty 2

## 2015-11-04 NOTE — Progress Notes (Signed)
6 beats NSVT on telemetry.  Pt asymptomatic.  VSS.  Dr. Einar Gip notified.  Will add magnesium level to am labs per MD order.  Will continue to monitor.  Jodell Cipro

## 2015-11-04 NOTE — Consult Note (Signed)
   [facility] CM Inpatient Consult   11/04/2015  Peter Bruce 03/22/1951 [id number]  Referral received to assess for care management services.  Met with the patient regarding the benefits of [place] Management services.  Explained that [place] [facility] is a covered benefit of Medicare insurance. Review information for [facility] Care Management and a brochure was provided with contact information.  Explained that [place] [facility] does not interfere with or replace any services arranged by the inpatient care management staff. Patient states he has been set up with home health care and he feels that that will be enough for him right now.  He states he feels that he was already doing 75% of the things he has read about in caring for his HF.  Patient states he has not chosen a new primary care provider since him MD retired in April.  Express the importance of having a medical home and encouraged him to check out the website for [facility] as well.  He verbalized he will also speak with a worker at one of the surgical offices that were going to assist him with that as well.  Patient declined services with [facility] [facility] at this time.  He states he will read the brochure and if he finds he needs additional help after home health care he would call, the number provided on the brochure.  A magnet was given as well with the 24 hour nurse advise line, as well.  For questions, please contact:  Natividad Brood, RN BSN [facility] Liaison  [phone] business mobile phone Toll free office [phone]

## 2015-11-04 NOTE — Progress Notes (Signed)
Subjective:  Feels much better, dyspnea better, however states that he's not happy being in the hospital, has been having hallucinations and also disturbed sleep. Multiple somatic complaints. Patient's sister present at the bedside.  Objective:  Vital Signs in the last 24 hours: Temp:  [97.6 F (36.4 C)-98.6 F (37 C)] 97.6 F (36.4 C) (06/27 1712) Pulse Rate:  [73-83] 78 (06/27 1712) Resp:  [20-27] 27 (06/27 0700) BP: (99-121)/(77-94) 110/77 mmHg (06/27 1712) SpO2:  [92 %-99 %] 92 % (06/27 1712) Weight:  [86.229 kg (190 lb 1.6 oz)] 86.229 kg (190 lb 1.6 oz) (06/27 0450)  Blood pressure 135/100, pulse 67, temperature 98.7 F (37.1 C), temperature source Oral, resp. rate 19, height 5\' 10"  (1.778 m), weight 86.047 kg (189 lb 11.2 oz), SpO2 97 %. General appearance: alert, cooperative, appears stated age, fatigued and mild distress Eyes: negative findings: lids and lashes normal Neck: no adenopathy, no carotid bruit, supple, symmetrical, trachea midline, thyroid not enlarged, symmetric, no tenderness/mass/nodules and JVD present up to the angle of the jaw Neck: JVP elevated below angle of jaw - improved. carotids 2+= without bruits Resp: Decreased breath sounds right base up to two thirds lung. faint crackles left base. Chest wall: no tenderness Cardio: S1 is now for, S2 is normal, there is a long systolic murmur at the apex conducted to the axilla. Grade 3/6. No gallop appreciated. GI: Mild hepatomegaly present. Otherwise abdomen is nontender. Bowel sounds heard in all 4 quadrants. Extremities: edema 1-2+ bilateral below knee, pitting.  Pulses: 2+ and symmetric  Intake/Output from previous day: 06/26 0701 - 06/27 0700 In: 60 [P.O.:820] Out: 1150 [Urine:1150]  Recent Labs  11/02/15 0628 11/03/15 0521 11/04/15 0434  NA 142 141 141  K 3.3* 3.3* 3.9  CL 106 105 105  CO2 27 27 30   GLUCOSE 98 94 108*  BUN 30* 25* 26*  CREATININE 1.56* 1.50* 1.53*  CALCIUM 8.9 8.8* 9.1  GFRNONAA  44* 46* 45*  GFRAA 51* 54* 53*    Recent Labs Lab 11/01/15 1819  WBC 8.4  RBC 4.57  HGB 14.4  HCT 41.7  PLT 132*  MCV 91.2  MCH 31.5  MCHC 34.5  RDW 15.6*  LYMPHSABS 1.0  MONOABS 0.8  EOSABS 0.1  BASOSABS 0.0    HEMOGLOBIN A1C Lab Results  Component Value Date   HGBA1C 5.5 05/27/2015   MPG 111 05/27/2015    Recent Labs  11/13/14 1643 09/19/15 1139  TSH 1.505 1.98    Recent Labs  10/14/15 1127 11/01/15 1819 11/02/15 0628  PROT 6.5 6.6 5.7*  ALBUMIN 3.9 4.0 3.2*  AST 20 32 29  ALT 18 28 24   ALKPHOS 54 50 45  BILITOT 1.0 1.9* 2.0*   Cardiac Studies:   EKG: 11/01/2015: Sinus rhythm with first-degree AV block, Normal axis, poor progression, cannot exclude anterior infarct old. PVC. QT interval slightly prolonged at 480 ms.  Filed: 10/30/2015 8:42 AM Note Time: 10/30/2015 8:36 AM Status: Signed    Editor: Adrian Prows, MD (Physician)     Expand All Collapse All  TEE: Under moderate sedation, TEE was performed without complications: LV: Normal. Normal EF. RV: NormalL  LA: Enlarged/ LAA ligated during CABG. Inter atrial septum is intact without defect.  RA: Normal  MV: Myxomatous. Incomplete coaption with prolapse of the anterior MV leaflet with severe posteriorly directed MR. TV: Normal Mild TR, no pul HTN  AV: Normal. Trace AI. PV: Normal. Trace PI.  Thoracic and ascending aorta: Mild atheromatous changes.  Assessment/Plan:   Assessment/Plan 1. Respiratory distress secondary to acute pulmonary edema from severe mitral regurgitation. 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 BNP has increased again today. Acute on chronic diastolic heart failure. 2. History of paroxysmal atrial fibrillation and atrial flutter, S/P Maze procedure and left atrial appendage closure during CABG,  presently on Xarelto. Continue the same. CHA2DS2-VASCScore: Risk Score 3.0 Yearly risk of stroke 3.2   3. Atherosclerosis of native coronary artery of native heart with angina pectoris S/P CABG x 3 on 05/29/2015 with LIMA to LAD, SVG to OM2, SVG to RCA; and Maze procedure and clipping of left atrial appendage. 4. History of pulmonary embolism, bilateral in 2010, spontaneous, Lupus anticoagulant positive. 5. Hypertension 6. History of fibromyalgia 7. Hyperlipidemia - patient has not been able to tolerate statins. Lipid profile 2015: Total cholesterol 218, triglycerides 99, HDL 50, LDL 148. And on 10/16/2015: Total cholesterol 147, triglycerides 49, HDL 47, LDL 90  8.  Chronic renal insufficiency stage III  Probably secondary to hypertension with hypertensive heart disease and congestive heart failure contributing.  Rec: I tried to convince the patient that he should stay and have further management of his heart failure prior to discharge. Patient very agitated, patient's sister states that he may be having some hallucinations also. I will start the patient on Haldol 0.5 mg by mouth twice a day and also give Valium at night on a regular basis instead of when necessary basis as per patient request.  Serum creatinine is remained stable in spite of addition of Aldactone.  I have discussed with radiology to perform thoracentesis due to persistent right pleural effusion to improve aviation of his lungs. I advised the patient that I will certainly discharge him home tomorrow as he is very agitated and wants to be discharged. Patient's sister has volunteered to stay overnight with the patient. Patient will eventually need mitral valve repair, I will perform coronary angiography on an elective way probably next week to reduce the risk of contrast nephropathy especially in view of acute decompensated heart failure.  Adrian Prows, M.D. 11/04/2015, 6:48 PM [facility] Pager: [phone] Office:  [phone] If no answer: [phone]

## 2015-11-05 ENCOUNTER — Inpatient Hospital Stay (HOSPITAL_COMMUNITY): Payer: Medicare Other

## 2015-11-05 ENCOUNTER — Other Ambulatory Visit: Payer: Self-pay | Admitting: *Deleted

## 2015-11-05 DIAGNOSIS — I340 Nonrheumatic mitral (valve) insufficiency: Secondary | ICD-10-CM

## 2015-11-05 LAB — BASIC METABOLIC PANEL
ANION GAP: 11 (ref 5–15)
BUN: 28 mg/dL — ABNORMAL HIGH (ref 6–20)
CHLORIDE: 104 mmol/L (ref 101–111)
CO2: 26 mmol/L (ref 22–32)
CREATININE: 1.5 mg/dL — AB (ref 0.61–1.24)
Calcium: 8.8 mg/dL — ABNORMAL LOW (ref 8.9–10.3)
GFR calc non Af Amer: 46 mL/min — ABNORMAL LOW (ref >60)
GFR, EST AFRICAN AMERICAN: 54 mL/min — AB (ref >60)
Glucose, Bld: 94 mg/dL (ref 65–99)
POTASSIUM: 3.8 mmol/L (ref 3.5–5.1)
SODIUM: 141 mmol/L (ref 135–145)

## 2015-11-05 LAB — CBC
HCT: 40.7 % (ref 39.0–52.0)
HEMOGLOBIN: 13.4 g/dL (ref 13.0–17.0)
MCH: 31.3 pg (ref 26.0–34.0)
MCHC: 32.9 g/dL (ref 30.0–36.0)
MCV: 95.1 fL (ref 78.0–100.0)
PLATELETS: 112 10*3/uL — AB (ref 150–400)
RBC: 4.28 MIL/uL (ref 4.22–5.81)
RDW: 15.4 % (ref 11.5–15.5)
WBC: 11.4 10*3/uL — ABNORMAL HIGH (ref 4.0–10.5)

## 2015-11-05 LAB — BRAIN NATRIURETIC PEPTIDE: B Natriuretic Peptide: 867.3 pg/mL — ABNORMAL HIGH (ref 0.0–100.0)

## 2015-11-05 MED ORDER — LIDOCAINE HCL 1 % IJ SOLN
INTRAMUSCULAR | Status: AC
  Filled 2015-11-05: qty 20

## 2015-11-05 NOTE — Care Management Important Message (Signed)
Important Message  Patient Details  Name: Peter Bruce MRN: [id number] Date of Birth: [birth year]   Medicare Important Message Given:  Yes    [name] 11/05/2015, 10:08 AM

## 2015-11-05 NOTE — Progress Notes (Signed)
Spoke to Dr. Einar Gip he stated to place order for blood cultures if temp is 101 or greater.

## 2015-11-05 NOTE — Progress Notes (Signed)
Patient has temp of 101.6.  Dr. Einar Gip paged awaiting call back. Will give 650mg  tylenol po.  Will continue to monitor

## 2015-11-05 NOTE — Progress Notes (Signed)
Subjective:  Slept well and sister was in the room through the night.  Patient's sister present at the bedside. Saw him prior to thorocentesis.   Objective:  Vital Signs in the last 24 hours: Temp:  [97.4 F (36.3 C)-101.6 F (38.7 C)] 101.6 F (38.7 C) (06/28 1556) Pulse Rate:  [61-88] 87 (06/28 1556) Resp:  [17-20] 17 (06/28 0435) BP: (102-139)/(62-95) 119/76 mmHg (06/28 1525) SpO2:  [92 %-97 %] 95 % (06/28 1556) Weight:  [84.868 kg (187 lb 1.6 oz)] 84.868 kg (187 lb 1.6 oz) (06/28 0435)  Blood pressure 135/100, pulse 67, temperature 98.7 F (37.1 C), temperature source Oral, resp. rate 19, height 5\' 10"  (1.778 m), weight 86.047 kg (189 lb 11.2 oz), SpO2 97 %. General appearance: alert, cooperative, appears stated age, fatigued and mild distress Eyes: negative findings: lids and lashes normal Neck: no adenopathy, no carotid bruit, supple, symmetrical, trachea midline, thyroid not enlarged, symmetric, no tenderness/mass/nodules and JVD present up to the angle of the jaw Neck: JVP elevated below angle of jaw - improved. carotids 2+= without bruits Resp: Decreased breath sounds right base up to two thirds lung. faint crackles left base. Chest wall: no tenderness Cardio: S1 is now for, S2 is normal, there is a long systolic murmur at the apex conducted to the axilla. Grade 3/6. No gallop appreciated. GI: Mild hepatomegaly present. Otherwise abdomen is nontender. Bowel sounds heard in all 4 quadrants. Extremities: edema 1-2+ bilateral below knee, pitting.  Pulses: 2+ and symmetric  Intake/Output from previous day: 06/27 0701 - 06/28 0700 In: 320 [P.O.:320] Out: 1775 [Urine:1775]  Recent Labs  11/03/15 0521 11/04/15 0434 11/05/15 0418  NA 141 141 141  K 3.3* 3.9 3.8  CL 105 105 104  CO2 27 30 26   GLUCOSE 94 108* 94  BUN 25* 26* 28*  CREATININE 1.50* 1.53* 1.50*  CALCIUM 8.8* 9.1 8.8*  GFRNONAA 46* 45* 46*  GFRAA 54* 53* 54*    Recent Labs Lab 11/01/15 1819  WBC 8.4   RBC 4.57  HGB 14.4  HCT 41.7  PLT 132*  MCV 91.2  MCH 31.5  MCHC 34.5  RDW 15.6*  LYMPHSABS 1.0  MONOABS 0.8  EOSABS 0.1  BASOSABS 0.0    HEMOGLOBIN A1C Lab Results  Component Value Date   HGBA1C 5.5 05/27/2015   MPG 111 05/27/2015    Recent Labs  11/13/14 1643 09/19/15 1139  TSH 1.505 1.98    Recent Labs  10/14/15 1127 11/01/15 1819 11/02/15 0628  PROT 6.5 6.6 5.7*  ALBUMIN 3.9 4.0 3.2*  AST 20 32 29  ALT 18 28 24   ALKPHOS 54 50 45  BILITOT 1.0 1.9* 2.0*   Cardiac Studies:   EKG: 11/01/2015: Sinus rhythm with first-degree AV block, Normal axis, poor progression, cannot exclude anterior infarct old. PVC. QT interval slightly prolonged at 480 ms.  Filed: 10/30/2015 8:42 AM Note Time: 10/30/2015 8:36 AM Status: Signed    Editor: Adrian Prows, MD (Physician)     Expand All Collapse All  TEE: Under moderate sedation, TEE was performed without complications: LV: Normal. Normal EF. RV: NormalL  LA: Enlarged/ LAA ligated during CABG. Inter atrial septum is intact without defect.  RA: Normal  MV: Myxomatous. Incomplete coaption with prolapse of the anterior MV leaflet with severe posteriorly directed MR. TV: Normal Mild TR, no pul HTN  AV: Normal. Trace AI. PV: Normal. Trace PI.  Thoracic and ascending aorta: Mild atheromatous changes.         Assessment/Plan:  Assessment/Plan 1. Respiratory distress secondary to acute pulmonary edema from severe mitral regurgitation. 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 BNP has increased again today. Acute on chronic diastolic heart failure. 2. History of paroxysmal atrial fibrillation and atrial flutter, S/P Maze procedure and left atrial appendage closure during CABG, presently on Xarelto. Continue the same. CHA2DS2-VASCScore: Risk Score 3.0 Yearly  risk of stroke 3.2   3. Atherosclerosis of native coronary artery of native heart with angina pectoris S/P CABG x 3 on 05/29/2015 with LIMA to LAD, SVG to OM2, SVG to RCA; and Maze procedure and clipping of left atrial appendage. 4. History of pulmonary embolism, bilateral in 2010, spontaneous, Lupus anticoagulant positive. 5. Hypertension 6. History of fibromyalgia 7. Hyperlipidemia - patient has not been able to tolerate statins. Lipid profile 2015: Total cholesterol 218, triglycerides 99, HDL 50, LDL 148. And on 10/16/2015: Total cholesterol 147, triglycerides 49, HDL 47, LDL 90  8.  Chronic renal insufficiency stage III  Probably secondary to hypertension with hypertensive heart disease and congestive heart failure contributing.  Rec:   He did not want to take Haldol.  Slept well with his sister being there is no room.  Underwent pleurocentesis and 2 L.  Fluid withdrawn   Hadpostprocedure temperature raise of 101F  This evening  Patient is willing to stay overnight.  Discussed with his wife.  I do not think there is any acute sepsis, however  I'll have a low threshold to perform blood cultures if he were to have recurrence of fever.  We'll continue to follow for now, serum creatinine is stable on ARB and also spironolactone.  We will recheck his BMP, CBC and BNP in the morning and if he remains stable we'll potentially discharge him.  Significant improvement in chest x-ray findings.  I plan on performing  [facility] angiography sometime next week.Marland Kitchen Adrian Prows, M.D. 11/05/2015, 5:52 PM [facility] Pager: [phone] Office: [phone] If no answer: [phone]

## 2015-11-05 NOTE — Procedures (Signed)
Successful US guided right thoracentesis. Yielded 2 liters of clear yellow fluid. Pt tolerated procedure well. No immediate complications.  CXR without pneumothorax.  Sabrina Keough S Jaxsyn Azam PA-C 11/05/2015 1:00 PM

## 2015-11-06 LAB — CBC
HEMATOCRIT: 42.9 % (ref 39.0–52.0)
HEMOGLOBIN: 13.5 g/dL (ref 13.0–17.0)
MCH: 30.3 pg (ref 26.0–34.0)
MCHC: 31.5 g/dL (ref 30.0–36.0)
MCV: 96.4 fL (ref 78.0–100.0)
Platelets: 142 10*3/uL — ABNORMAL LOW (ref 150–400)
RBC: 4.45 MIL/uL (ref 4.22–5.81)
RDW: 15.3 % (ref 11.5–15.5)
WBC: 10.2 10*3/uL (ref 4.0–10.5)

## 2015-11-06 LAB — COMPREHENSIVE METABOLIC PANEL
ALBUMIN: 3.1 g/dL — AB (ref 3.5–5.0)
ALT: 18 U/L (ref 17–63)
AST: 20 U/L (ref 15–41)
Alkaline Phosphatase: 46 U/L (ref 38–126)
Anion gap: 6 (ref 5–15)
BILIRUBIN TOTAL: 2.1 mg/dL — AB (ref 0.3–1.2)
BUN: 29 mg/dL — AB (ref 6–20)
CHLORIDE: 104 mmol/L (ref 101–111)
CO2: 28 mmol/L (ref 22–32)
Calcium: 8.6 mg/dL — ABNORMAL LOW (ref 8.9–10.3)
Creatinine, Ser: 1.45 mg/dL — ABNORMAL HIGH (ref 0.61–1.24)
GFR calc Af Amer: 56 mL/min — ABNORMAL LOW (ref >60)
GFR calc non Af Amer: 48 mL/min — ABNORMAL LOW (ref >60)
GLUCOSE: 97 mg/dL (ref 65–99)
POTASSIUM: 4 mmol/L (ref 3.5–5.1)
SODIUM: 138 mmol/L (ref 135–145)
Total Protein: 5.9 g/dL — ABNORMAL LOW (ref 6.5–8.1)

## 2015-11-06 LAB — BRAIN NATRIURETIC PEPTIDE: B Natriuretic Peptide: 613.6 pg/mL — ABNORMAL HIGH (ref 0.0–100.0)

## 2015-11-06 LAB — LACTATE DEHYDROGENASE: LDH: 174 U/L (ref 98–192)

## 2015-11-06 MED ORDER — ISOSORBIDE MONONITRATE ER 60 MG PO TB24: 60.0000 mg | ORAL_TABLET | Freq: Every day | ORAL | Status: DC

## 2015-11-06 MED ORDER — SPIRONOLACTONE 25 MG PO TABS: 25.0000 mg | ORAL_TABLET | Freq: Every day | ORAL | Status: DC

## 2015-11-06 MED ORDER — RIVAROXABAN 15 MG PO TABS: 15.0000 mg | ORAL_TABLET | Freq: Every day | ORAL | Status: DC

## 2015-11-06 NOTE — Discharge Summary (Signed)
Physician Discharge Summary  Patient ID: Peter Bruce MRN: [id number] DOB/AGE: 67-Aug-1952 67 y.o.  Admit date: 11/01/2015 Discharge date: 11/06/2015  Primary Discharge Diagnosis 1.  Acute on chronic diastolic heart failure secondary to severe mitral regurgitation.  2.  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 3. Right pleural effusion secondary to congestive heart failure, S/P 2 L aspiration of clear liquid.  4. History of paroxysmal atrial fibrillation and atrial flutter, S/P Maze procedure and left atrial appendage closure during CABG, presently on Xarelto. Continue the same. CHA2DS2-VASCScore: Risk Score 3.0 Yearly risk of stroke 3.2   5. Atherosclerosis of native coronary artery of native heart with angina pectoris S/P CABG x 3 on 05/29/2015 with LIMA to LAD, SVG to OM2, SVG to RCA; and Maze procedure and clipping of left atrial appendage. 6  Stage III chronic kidney disease secondary to hypertension,CHF contributing. 7. History of pulmonary embolism, bilateral in 2010, spontaneous, Lupus anticoagulant positive. 8. Hypertension 9. History of fibromyalgia 10. Hyperlipidemia - patient has not been able to tolerate statins. 11: Hypoxemia, patient on 2 L nasal cannula oxygen had oxygen saturation of 91%, needs home oxygen. Lipid profile 2015: Total cholesterol 218, triglycerides 99, HDL 50, LDL 148. And on 10/16/2015: Total cholesterol 147, triglycerides 49, HDL 47, LDL 90.  Significant Diagnostic Studies:  EKG: 11/01/2015: Sinus rhythm with first-degree AV block, Normal axis, poor progression, cannot exclude anterior infarct old. PVC. QT interval slightly prolonged at 480 ms.        Editor: Adrian Prows, MD (Physician)     Expand All Collapse All  TEE: 10/30/15: Under moderate sedation, TEE was performed without complications: LV: Normal. Normal EF. RV: NormalL  LA: Enlarged/ LAA ligated during  CABG. Inter atrial septum is intact without defect.  RA: Normal  MV: Myxomatous. Incomplete coaption with prolapse of the anterior MV leaflet with severe posteriorly directed MR. TV: Normal Mild TR, no pul HTN  AV: Normal. Trace AI. PV: Normal. Trace PI.  Thoracic and ascending aorta: Mild atheromatous changes.         Hospital Course:  Patient admitted to the hospital with respiratory distress, hypoxemia and acute on chronic diastolic heart failure.  He was decreased, however due to persistent right pleural effusion, he underwent ultrasound-guided thoracentesis on 11/05/2015 with aspiration of straw-colored 2 L fluid.  Following this patient's symptoms of dyspnea has significantly improved.  The evening of thoracentesis, patient did develop a temperature of 10 48F however there was no recurrence.  Labs remained stable in spite of addition of spironolactone.  He was felt stable for discharge, was advised not to do any heavy exertional activity.    Recommendations on discharge: He will be scheduled for elective mitral valve repair by Dr. Roxy Manns.  He needs left heart catheterization prior to surgery, I will try to schedule this next week.  Angiography was not performed due to acute decompensated heart failure and risk of contrast nephropathy and precipitation of worsening heart failure.  Other option would be to bring the patient one day prior to surgery for coronary angiography.  I'll discuss this with Dr. Roxy Manns. Continue valsartan and Aldactone, needs to be held prior to CT angiogram that has been set by Dr. Roxy Manns and also need to be held 1 day prior to coronary angiography and the day of angiography. Xarelto dose was adjusted, decreased to 15 mg due to chronic renal insufficiency.  Discharge Exam: Blood pressure 127/95, pulse 86, temperature 97.8  F (36.6 C), temperature source Oral, resp. rate 18, height 5\' 10"  (1.778 m), weight 82.464 kg (181 lb 12.8 oz), SpO2 98 %.   General appearance:  alert, cooperative, appears stated age, no distress Eyes: negative findings: lids and lashes normal Neck: no adenopathy, no carotid bruit, supple, symmetrical, trachea midline, thyroid not enlarged, symmetric, no tenderness/mass/nodules and JVD present up to the angle of the jaw Neck: JVP Not elevated. carotids 2+= without bruits Resp: Decreased breath sounds right base up to two thirds lung. faint crackles left base. Chest wall: no tenderness Cardio: S1 is muffled for, S2 is normal, there is a long systolic murmur at the apex conducted to the axilla. Grade 3/6. No gallop appreciated. GI: abdomen is nontender. Bowel sounds heard in all 4 quadrants. Extremities: edema 1-2+ bilateral below knee, pitting. Much improved from 2-3 plus on admission.  Pulses: 2+ and symmetric Labs:   Lab Results  Component Value Date   WBC 10.2 11/06/2015   HGB 13.5 11/06/2015   HCT 42.9 11/06/2015   MCV 96.4 11/06/2015   PLT 142* 11/06/2015    Recent Labs Lab 11/05/15 2356  NA 138  K 4.0  CL 104  CO2 28  BUN 29*  CREATININE 1.45*  CALCIUM 8.6*  PROT 5.9*  BILITOT 2.1*  ALKPHOS 46  ALT 18  AST 20  GLUCOSE 97    Lipid Panel     Component Value Date/Time   CHOL 147 10/14/2015 1127   TRIG 49 10/14/2015 1127   HDL 47 10/14/2015 1127   CHOLHDL 3.1 10/14/2015 1127   VLDL 10 10/14/2015 1127   LDLCALC 90 10/14/2015 1127    Recent Labs  11/04/15 0434 11/05/15 0418 11/06/15 0334  BNP 1020.1* 867.3* 613.6*    HEMOGLOBIN A1C Lab Results  Component Value Date   HGBA1C 5.5 05/27/2015   MPG 111 05/27/2015    Recent Labs  11/13/14 1643 09/19/15 1139  TSH 1.505 1.98    EKG: 11/01/2015: Sinus rhythm with first-degree AV block, Normal axis, poor progression, cannot exclude anterior infarct old. PVC. QT interval slightly prolonged at 480 ms.  Radiology: Dg Chest 1 View  11/05/2015  CLINICAL DATA:  Right pleural effusion post thoracentesis EXAM: CHEST 1 VIEW COMPARISON:  11/03/2015  FINDINGS: Decrease in right pleural effusion following thoracentesis. Small residual effusion. No pneumothorax. Improvement in right lower lobe atelectasis. Small left effusion unchanged. Mild vascular congestion. Prior heart surgery. IMPRESSION: No pneumothorax post right thoracentesis. Electronically Signed   By: Franchot Gallo M.D.   On: 11/05/2015 11:33   Dg Chest 2 View  11/03/2015  CLINICAL DATA:  Prior CABG in January 2017, possible mitral valve problem, cardioversion in March. Weakness. Loss of appetite. EXAM: CHEST  2 VIEW COMPARISON:  10/12/2015 FINDINGS: CABG and atrial clip noted. Stable moderate enlargement of the cardiopericardial silhouette. Tortuous thoracic aorta. Large right pleural effusion with passive atelectasis. Prior edema appears resolved. Trace left pleural effusion. IMPRESSION: 1. Moderate enlargement of the cardiopericardial silhouette, but without current edema. 2. Large right pleural effusion with passive atelectasis. Trace left pleural effusion. Electronically Signed   By: Van Clines M.D.   On: 11/03/2015 12:18   Dg Chest 2 View  11/01/2015  CLINICAL DATA:  65 year old male with a history of weakness, shortness of breath, congestive heart failure. Recent a diagnosis of mitral regurgitation EXAM: CHEST  2 VIEW COMPARISON:  10/22/2015, 09/27/2015 FINDINGS: Cardiomediastinal silhouette unchanged with persisting cardiomegaly. Surgical changes of prior median sternotomy and CABG. Surgical changes of left atrial  amputation. Worsening airspace disease in the right lung extending from the hilum to the periphery. Pleural parenchymal thickening with obscuration the right hemidiaphragm and blunting of the right costophrenic angle. Right heart border is obscured. Left lung remains relatively well aerated. Lateral view demonstrates significant airspace disease. IMPRESSION: Worsening right-sided airspace opacity and pleural fluid. Given the known history of mitral regurgitation, this  may represent asymmetric edema with associated pleural effusion. Infection not excluded however, and the effusion may be parapneumonic. Correlation with presentation, lab values, and the severity of mitral regurgitation may be useful. Surgical changes of prior median sternotomy and CABG. Surgical changes of left atrial amputation. Signed, Dulcy Fanny. Earleen Newport, DO [facility] Radiology Electronically Signed   By: Corrie Mckusick D.O.   On: 11/01/2015 18:51   Dg Chest 2 View  10/22/2015  CLINICAL DATA:  Shortness of breath and dry cough EXAM: CHEST  2 VIEW COMPARISON:  09/27/2015 FINDINGS: Cardiac shadow is enlarged. Postsurgical changes are noted. Increasing right basilar infiltrate with effusion is noted. Some interval clearing of the left pleural effusion is seen. No bony abnormality is noted. IMPRESSION: Increasing right infiltrate and effusion. Interval slight decrease in left-sided pleural effusion. Electronically Signed   By: Inez Catalina M.D.   On: 10/22/2015 14:40   US Thoracentesis Asp Pleural Space W/img Guide  11/05/2015  INDICATION: Acute on chronic diastolic heart failure. Right pleural effusion. Request for therapeutic thoracentesis. EXAM: ULTRASOUND GUIDED RIGHT THORACENTESIS MEDICATIONS: None. COMPLICATIONS: None immediate. PROCEDURE: An ultrasound guided thoracentesis was thoroughly discussed with the patient and questions answered. The benefits, risks, alternatives and complications were also discussed. The patient understands and wishes to proceed with the procedure. Written consent was obtained. Ultrasound was performed to localize and mark an adequate pocket of fluid in the right chest. The area was then prepped and draped in the normal sterile fashion. 1% Lidocaine was used for local anesthesia. Under ultrasound guidance a 6 Fr Safe-T-Centesis catheter was introduced. Thoracentesis was performed. The catheter was removed and a dressing applied.  FINDINGS: A total of approximately 2 liters of clear yellow fluid was removed. IMPRESSION: Successful ultrasound guided right thoracentesis yielding 2 liters of pleural fluid. Read by:  Gareth Eagle, PA-C Electronically Signed   By: Jacqulynn Cadet M.D.   On: 11/05/2015 12:57      FOLLOW UP PLANS AND APPOINTMENTS    Medication List    TAKE these medications        diazepam 10 MG tablet  Commonly known as:  VALIUM  Take 10 mg by mouth every 6 (six) hours as needed for anxiety.     furosemide 40 MG tablet  Commonly known as:  LASIX  Take 1 tablet (40 mg total) by mouth as needed for edema.     isosorbide mononitrate 60 MG 24 hr tablet  Commonly known as:  IMDUR  Take 1 tablet (60 mg total) by mouth daily.     metoprolol succinate 25 MG 24 hr tablet  Commonly known as:  TOPROL-XL  Take 1 tablet (25 mg total) by mouth daily.     Rivaroxaban 15 MG Tabs tablet  Commonly known as:  XARELTO  Take 1 tablet (15 mg total) by mouth daily with supper.     spironolactone 25 MG tablet  Commonly known as:  ALDACTONE  Take 1 tablet (25 mg total) by mouth daily.     temazepam 30 MG capsule  Commonly known as:  RESTORIL  Take 30 mg by mouth  at bedtime as needed for sleep.     valsartan 160 MG tablet  Commonly known as:  DIOVAN  Take 160 mg by mouth daily.     zolpidem 10 MG tablet  Commonly known as:  AMBIEN  Take 10 mg by mouth at bedtime as needed for sleep.           Follow-up Information    Follow up with [facility].   Why:  Registered Nurse   Contact information:   [address] [place] [address] [phone]      Adrian Prows, MD 11/06/2015, 9:28 AM  Pager: [phone] Office: [phone] If no answer: [phone]

## 2015-11-06 NOTE — Addendum Note (Signed)
Addended by: Therisa Doyne on: 11/06/2015 03:43 PM   Modules accepted: Orders

## 2015-11-06 NOTE — Progress Notes (Signed)
Pt discharged home. Discharge instructions have been gone over with the patient. IV's removed. Pt given unit number and told to call if they have any concerns regarding their discharge instructions. Nathaly Dawkins V, RN   

## 2015-11-06 NOTE — Care Management Note (Addendum)
Case Management Note  Patient Details  Name: [name] MRN: [id number] Date of Birth: 06-22-1950  Subjective/Objective:                    Action/Plan: Anticipate discharge home today with [facility] for Heart Failure Disease Management. HHRN,  HHPT and HHOT. No further CM needs at present  but will be available should additional discharge needs arise. Made Mary, rep with [facility] aware of pending discharge.   Expected Discharge Date:                  Expected Discharge Plan:  Ages  In-House Referral:  NA  Discharge planning Services  CM Consult  Post Acute Care Choice:  Home Health Choice offered to:  Patient, Spouse  DME Arranged:  N/A DME Agency:  NA  HH Arranged:  RN [place] Agency:  [facility] ([facility])  Status of Service:  Completed, signed off  If discussed at [place] of Stay Meetings, dates discussed:  11/06/2015  Additional Comments:  Delrae Sawyers, RN 11/06/2015, 9:42 AM

## 2015-11-10 ENCOUNTER — Encounter: Payer: Self-pay | Admitting: Thoracic Surgery (Cardiothoracic Vascular Surgery)

## 2015-11-10 LAB — CULTURE, BLOOD (ROUTINE X 2)
CULTURE: NO GROWTH
Culture: NO GROWTH

## 2015-11-12 ENCOUNTER — Ambulatory Visit
Admit: 2015-11-12 | Discharge: 2015-11-12 | Disposition: A | Discharge status: Home / Self Care | Payer: Medicare Other | Attending: Thoracic Surgery (Cardiothoracic Vascular Surgery) | Admitting: Thoracic Surgery (Cardiothoracic Vascular Surgery)

## 2015-11-12 DIAGNOSIS — I701 Atherosclerosis of renal artery: Secondary | ICD-10-CM | POA: Diagnosis not present

## 2015-11-12 DIAGNOSIS — I340 Nonrheumatic mitral (valve) insufficiency: Secondary | ICD-10-CM

## 2015-11-12 DIAGNOSIS — I771 Stricture of artery: Secondary | ICD-10-CM | POA: Diagnosis not present

## 2015-11-12 MED ORDER — IOPAMIDOL (ISOVUE-370) INJECTION 76%
75.0000 mL | Freq: Once | INTRAVENOUS | Status: AC | PRN
  Administered 2015-11-12: 75 mL via INTRAVENOUS

## 2015-11-13 ENCOUNTER — Telehealth: Payer: Self-pay | Admitting: Cardiology

## 2015-11-13 NOTE — Telephone Encounter (Signed)
11/13/2015 lmom for pt to call and schedule monitor. removed the order unable to contact pt. stpegram   11/03/2015 [facility] for pt to call and schedule monitor. it appears pt is in hospital. stpegram  10/31/2015-Lmom for pt to call and reschedule monitor [email or web address].[email or web address]  10/24/2015 Lmom for pt to call and schedule monitor appt. stpegram

## 2015-11-14 ENCOUNTER — Other Ambulatory Visit: Payer: Self-pay | Admitting: *Deleted

## 2015-11-14 DIAGNOSIS — I340 Nonrheumatic mitral (valve) insufficiency: Secondary | ICD-10-CM

## 2015-11-17 ENCOUNTER — Encounter (HOSPITAL_COMMUNITY)
Admission: RE | Disposition: A | Discharge status: Home / Self Care | Payer: Self-pay | Source: Ambulatory Visit | Attending: Cardiology

## 2015-11-17 ENCOUNTER — Encounter (HOSPITAL_COMMUNITY): Payer: Self-pay | Admitting: Cardiology

## 2015-11-17 ENCOUNTER — Ambulatory Visit: Payer: Medicare Other | Admitting: Thoracic Surgery (Cardiothoracic Vascular Surgery)

## 2015-11-17 ENCOUNTER — Ambulatory Visit (HOSPITAL_COMMUNITY)
Admission: RE | Admit: 2015-11-17 | Discharge: 2015-11-17 | Disposition: A | Discharge status: Home / Self Care | Payer: Medicare Other | Source: Ambulatory Visit | Attending: Cardiology | Admitting: Cardiology

## 2015-11-17 DIAGNOSIS — I5033 Acute on chronic diastolic (congestive) heart failure: Secondary | ICD-10-CM | POA: Insufficient documentation

## 2015-11-17 DIAGNOSIS — I341 Nonrheumatic mitral (valve) prolapse: Secondary | ICD-10-CM | POA: Insufficient documentation

## 2015-11-17 DIAGNOSIS — Z7901 Long term (current) use of anticoagulants: Secondary | ICD-10-CM | POA: Diagnosis not present

## 2015-11-17 DIAGNOSIS — I2510 Atherosclerotic heart disease of native coronary artery without angina pectoris: Secondary | ICD-10-CM | POA: Diagnosis present

## 2015-11-17 DIAGNOSIS — I272 Other secondary pulmonary hypertension: Secondary | ICD-10-CM | POA: Diagnosis not present

## 2015-11-17 DIAGNOSIS — M797 Fibromyalgia: Secondary | ICD-10-CM | POA: Insufficient documentation

## 2015-11-17 DIAGNOSIS — I701 Atherosclerosis of renal artery: Secondary | ICD-10-CM | POA: Insufficient documentation

## 2015-11-17 DIAGNOSIS — N183 Chronic kidney disease, stage 3 (moderate): Secondary | ICD-10-CM | POA: Diagnosis not present

## 2015-11-17 DIAGNOSIS — I2582 Chronic total occlusion of coronary artery: Secondary | ICD-10-CM | POA: Insufficient documentation

## 2015-11-17 DIAGNOSIS — I440 Atrioventricular block, first degree: Secondary | ICD-10-CM | POA: Diagnosis not present

## 2015-11-17 DIAGNOSIS — I340 Nonrheumatic mitral (valve) insufficiency: Secondary | ICD-10-CM | POA: Diagnosis not present

## 2015-11-17 DIAGNOSIS — I25810 Atherosclerosis of coronary artery bypass graft(s) without angina pectoris: Secondary | ICD-10-CM | POA: Insufficient documentation

## 2015-11-17 DIAGNOSIS — E785 Hyperlipidemia, unspecified: Secondary | ICD-10-CM | POA: Diagnosis not present

## 2015-11-17 DIAGNOSIS — I2584 Coronary atherosclerosis due to calcified coronary lesion: Secondary | ICD-10-CM | POA: Diagnosis not present

## 2015-11-17 DIAGNOSIS — I130 Hypertensive heart and chronic kidney disease with heart failure and stage 1 through stage 4 chronic kidney disease, or unspecified chronic kidney disease: Secondary | ICD-10-CM | POA: Diagnosis not present

## 2015-11-17 DIAGNOSIS — Z86711 Personal history of pulmonary embolism: Secondary | ICD-10-CM | POA: Insufficient documentation

## 2015-11-17 DIAGNOSIS — I25118 Atherosclerotic heart disease of native coronary artery with other forms of angina pectoris: Secondary | ICD-10-CM | POA: Diagnosis not present

## 2015-11-17 HISTORY — PX: PERIPHERAL VASCULAR CATHETERIZATION: SHX172C

## 2015-11-17 HISTORY — PX: CARDIAC CATHETERIZATION: SHX172

## 2015-11-17 LAB — POCT I-STAT 3, ART BLOOD GAS (G3+)
ACID-BASE DEFICIT: 5 mmol/L — AB (ref 0.0–2.0)
Acid-base deficit: 3 mmol/L — ABNORMAL HIGH (ref 0.0–2.0)
BICARBONATE: 19.6 meq/L — AB (ref 20.0–24.0)
Bicarbonate: 21.5 mEq/L (ref 20.0–24.0)
O2 SAT: 95 %
O2 Saturation: 88 %
PCO2 ART: 34.8 mmHg — AB (ref 35.0–45.0)
PCO2 ART: 33.8 mmHg — AB (ref 35.0–45.0)
PH ART: 7.398 (ref 7.350–7.450)
PO2 ART: 76 mmHg — AB (ref 80.0–100.0)
TCO2: 23 mmol/L (ref 0–100)
TCO2: 21 mmol/L (ref 0–100)
pH, Arterial: 7.371 (ref 7.350–7.450)
pO2, Arterial: 54 mmHg — ABNORMAL LOW (ref 80.0–100.0)

## 2015-11-17 LAB — BASIC METABOLIC PANEL
Anion gap: 6 (ref 5–15)
BUN: 31 mg/dL — AB (ref 6–20)
CHLORIDE: 107 mmol/L (ref 101–111)
CO2: 25 mmol/L (ref 22–32)
CREATININE: 1.52 mg/dL — AB (ref 0.61–1.24)
Calcium: 9.2 mg/dL (ref 8.9–10.3)
GFR calc Af Amer: 53 mL/min — ABNORMAL LOW (ref >60)
GFR calc non Af Amer: 46 mL/min — ABNORMAL LOW (ref >60)
GLUCOSE: 96 mg/dL (ref 65–99)
Potassium: 3.9 mmol/L (ref 3.5–5.1)
Sodium: 138 mmol/L (ref 135–145)

## 2015-11-17 LAB — POCT I-STAT 3, VENOUS BLOOD GAS (G3P V)
Acid-base deficit: 1 mmol/L (ref 0.0–2.0)
Acid-base deficit: 1 mmol/L (ref 0.0–2.0)
BICARBONATE: 23.8 meq/L (ref 20.0–24.0)
Bicarbonate: 24.7 mEq/L — ABNORMAL HIGH (ref 20.0–24.0)
O2 Saturation: 44 %
O2 Saturation: 76 %
PCO2 VEN: 45 mmHg (ref 45.0–50.0)
PCO2 VEN: 39.3 mmHg — AB (ref 45.0–50.0)
PH VEN: 7.347 — AB (ref 7.250–7.300)
PH VEN: 7.39 — AB (ref 7.250–7.300)
PO2 VEN: 26 mmHg — AB (ref 31.0–45.0)
PO2 VEN: 41 mmHg (ref 31.0–45.0)
TCO2: 26 mmol/L (ref 0–100)
TCO2: 25 mmol/L (ref 0–100)

## 2015-11-17 LAB — PROTIME-INR
INR: 1.24 (ref 0.00–1.49)
Prothrombin Time: 15.8 seconds — ABNORMAL HIGH (ref 11.6–15.2)

## 2015-11-17 SURGERY — RIGHT/LEFT HEART CATH AND CORONARY/GRAFT ANGIOGRAPHY

## 2015-11-17 MED ORDER — SODIUM CHLORIDE 0.9 % IV SOLN: 250.0000 mL | INTRAVENOUS | Status: DC | PRN

## 2015-11-17 MED ORDER — SODIUM CHLORIDE 0.9% FLUSH: 3.0000 mL | Freq: Two times a day (BID) | INTRAVENOUS | Status: DC

## 2015-11-17 MED ORDER — HYDRALAZINE HCL 25 MG PO TABS: 25.0000 mg | ORAL_TABLET | Freq: Three times a day (TID) | ORAL | Status: DC

## 2015-11-17 MED ORDER — VALSARTAN 160 MG PO TABS: 160.0000 mg | ORAL_TABLET | Freq: Every day | ORAL | Status: DC

## 2015-11-17 MED ORDER — LIDOCAINE HCL (PF) 1 % IJ SOLN
INTRAMUSCULAR | Status: DC | PRN
  Administered 2015-11-17: 20 mL

## 2015-11-17 MED ORDER — ISOSORBIDE MONONITRATE ER 120 MG PO TB24: 120.0000 mg | ORAL_TABLET | Freq: Every day | ORAL | Status: DC

## 2015-11-17 MED ORDER — MIDAZOLAM HCL 2 MG/2ML IJ SOLN
INTRAMUSCULAR | Status: AC
  Filled 2015-11-17: qty 2

## 2015-11-17 MED ORDER — SODIUM CHLORIDE 0.9 % IV SOLN
INTRAVENOUS | Status: DC
  Administered 2015-11-17: 07:00:00 via INTRAVENOUS

## 2015-11-17 MED ORDER — HEPARIN (PORCINE) IN NACL 2-0.9 UNIT/ML-% IJ SOLN
INTRAMUSCULAR | Status: AC
  Filled 2015-11-17: qty 1000

## 2015-11-17 MED ORDER — SODIUM CHLORIDE 0.9% FLUSH: 3.0000 mL | INTRAVENOUS | Status: DC | PRN

## 2015-11-17 MED ORDER — IOPAMIDOL (ISOVUE-370) INJECTION 76%
INTRAVENOUS | Status: DC | PRN
  Administered 2015-11-17: 65 mL via INTRAVENOUS

## 2015-11-17 MED ORDER — ASPIRIN 81 MG PO CHEW
81.0000 mg | CHEWABLE_TABLET | ORAL | Status: AC
Start: 2015-11-17 — End: 2015-11-17
  Administered 2015-11-17: 81 mg via ORAL

## 2015-11-17 MED ORDER — FENTANYL CITRATE (PF) 100 MCG/2ML IJ SOLN
INTRAMUSCULAR | Status: AC
  Filled 2015-11-17: qty 2

## 2015-11-17 MED ORDER — MIDAZOLAM HCL 2 MG/2ML IJ SOLN
INTRAMUSCULAR | Status: DC | PRN
  Administered 2015-11-17: 2 mg via INTRAVENOUS

## 2015-11-17 MED ORDER — HEPARIN (PORCINE) IN NACL 2-0.9 UNIT/ML-% IJ SOLN
INTRAMUSCULAR | Status: DC | PRN
  Administered 2015-11-17: 1000 mL

## 2015-11-17 MED ORDER — IOPAMIDOL (ISOVUE-370) INJECTION 76%
INTRAVENOUS | Status: AC
  Filled 2015-11-17: qty 125

## 2015-11-17 MED ORDER — SODIUM CHLORIDE 0.9 % WEIGHT BASED INFUSION: 1.0000 mL/kg/h | INTRAVENOUS | Status: AC

## 2015-11-17 MED ORDER — LIDOCAINE HCL (PF) 1 % IJ SOLN
INTRAMUSCULAR | Status: AC
  Filled 2015-11-17: qty 30

## 2015-11-17 MED ORDER — FENTANYL CITRATE (PF) 100 MCG/2ML IJ SOLN
INTRAMUSCULAR | Status: DC | PRN
  Administered 2015-11-17: 50 ug via INTRAVENOUS

## 2015-11-17 MED ORDER — ASPIRIN 81 MG PO CHEW
CHEWABLE_TABLET | ORAL | Status: AC
  Administered 2015-11-17: 81 mg via ORAL
  Filled 2015-11-17: qty 1

## 2015-11-17 SURGICAL SUPPLY — 11 items
CATH INFINITI 5FR MPB2 (CATHETERS) ×4 IMPLANT
CATH INFINITI 5FR MULTPACK ANG (CATHETERS) ×4 IMPLANT
CATH SWAN GANZ 7F STRAIGHT (CATHETERS) ×4 IMPLANT
KIT HEART LEFT (KITS) ×4 IMPLANT
PACK CARDIAC CATHETERIZATION (CUSTOM PROCEDURE TRAY) ×4 IMPLANT
SET INTRODUCER MICROPUNCT 5F (INTRODUCER) ×4 IMPLANT
SHEATH PINNACLE 5F 10CM (SHEATH) ×4 IMPLANT
SHEATH PINNACLE 7F 10CM (SHEATH) ×4 IMPLANT
TRANSDUCER W/STOPCOCK (MISCELLANEOUS) ×4 IMPLANT
WIRE EMERALD 3MM-J .025X260CM (WIRE) ×4 IMPLANT
WIRE EMERALD 3MM-J .035X150CM (WIRE) ×4 IMPLANT

## 2015-11-17 NOTE — Progress Notes (Signed)
Site area:  Rt groin Site Prior to Removal:  Level 0  Pressure Applied For:  20 minutes Manual:  0  Patient Status During Pull:  stable Post Pull Site:  Level  0 Post Pull Instructions Given:  yes Post Pull Pulses Present: yes Dressing Applied:  tegaderm Bedrest begins @  0900 Comments:

## 2015-11-17 NOTE — H&P (View-Only) (Signed)
Subjective:  Slept well and sister was in the room through the night.  Patient's sister present at the bedside. Saw him prior to thorocentesis.   Objective:  Vital Signs in the last 24 hours: Temp:  [97.4 F (36.3 C)-101.6 F (38.7 C)] 101.6 F (38.7 C) (06/28 1556) Pulse Rate:  [61-88] 87 (06/28 1556) Resp:  [17-20] 17 (06/28 0435) BP: (102-139)/(62-95) 119/76 mmHg (06/28 1525) SpO2:  [92 %-97 %] 95 % (06/28 1556) Weight:  [84.868 kg (187 lb 1.6 oz)] 84.868 kg (187 lb 1.6 oz) (06/28 0435)  Blood pressure 135/100, pulse 67, temperature 98.7 F (37.1 C), temperature source Oral, resp. rate 19, height 5\' 10"  (1.778 m), weight 86.047 kg (189 lb 11.2 oz), SpO2 97 %. General appearance: alert, cooperative, appears stated age, fatigued and mild distress Eyes: negative findings: lids and lashes normal Neck: no adenopathy, no carotid bruit, supple, symmetrical, trachea midline, thyroid not enlarged, symmetric, no tenderness/mass/nodules and JVD present up to the angle of the jaw Neck: JVP elevated below angle of jaw - improved. carotids 2+= without bruits Resp: Decreased breath sounds right base up to two thirds lung. faint crackles left base. Chest wall: no tenderness Cardio: S1 is now for, S2 is normal, there is a long systolic murmur at the apex conducted to the axilla. Grade 3/6. No gallop appreciated. GI: Mild hepatomegaly present. Otherwise abdomen is nontender. Bowel sounds heard in all 4 quadrants. Extremities: edema 1-2+ bilateral below knee, pitting.  Pulses: 2+ and symmetric  Intake/Output from previous day: 06/27 0701 - 06/28 0700 In: 320 [P.O.:320] Out: 1775 [Urine:1775]  Recent Labs  11/03/15 0521 11/04/15 0434 11/05/15 0418  NA 141 141 141  K 3.3* 3.9 3.8  CL 105 105 104  CO2 27 30 26   GLUCOSE 94 108* 94  BUN 25* 26* 28*  CREATININE 1.50* 1.53* 1.50*  CALCIUM 8.8* 9.1 8.8*  GFRNONAA 46* 45* 46*  GFRAA 54* 53* 54*    Recent Labs Lab 11/01/15 1819  WBC 8.4   RBC 4.57  HGB 14.4  HCT 41.7  PLT 132*  MCV 91.2  MCH 31.5  MCHC 34.5  RDW 15.6*  LYMPHSABS 1.0  MONOABS 0.8  EOSABS 0.1  BASOSABS 0.0    HEMOGLOBIN A1C Lab Results  Component Value Date   HGBA1C 5.5 05/27/2015   MPG 111 05/27/2015    Recent Labs  11/13/14 1643 09/19/15 1139  TSH 1.505 1.98    Recent Labs  10/14/15 1127 11/01/15 1819 11/02/15 0628  PROT 6.5 6.6 5.7*  ALBUMIN 3.9 4.0 3.2*  AST 20 32 29  ALT 18 28 24   ALKPHOS 54 50 45  BILITOT 1.0 1.9* 2.0*   Cardiac Studies:   EKG: 11/01/2015: Sinus rhythm with first-degree AV block, Normal axis, poor progression, cannot exclude anterior infarct old. PVC. QT interval slightly prolonged at 480 ms.  Filed: 10/30/2015 8:42 AM Note Time: 10/30/2015 8:36 AM Status: Signed    Editor: Adrian Prows, MD (Physician)     Expand All Collapse All  TEE: Under moderate sedation, TEE was performed without complications: LV: Normal. Normal EF. RV: NormalL  LA: Enlarged/ LAA ligated during CABG. Inter atrial septum is intact without defect.  RA: Normal  MV: Myxomatous. Incomplete coaption with prolapse of the anterior MV leaflet with severe posteriorly directed MR. TV: Normal Mild TR, no pul HTN  AV: Normal. Trace AI. PV: Normal. Trace PI.  Thoracic and ascending aorta: Mild atheromatous changes.         Assessment/Plan:  Assessment/Plan 1. Respiratory distress secondary to acute pulmonary edema from severe mitral regurgitation. 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 BNP has increased again today. Acute on chronic diastolic heart failure. 2. History of paroxysmal atrial fibrillation and atrial flutter, S/P Maze procedure and left atrial appendage closure during CABG, presently on Xarelto. Continue the same. CHA2DS2-VASCScore: Risk Score 3.0 Yearly  risk of stroke 3.2   3. Atherosclerosis of native coronary artery of native heart with angina pectoris S/P CABG x 3 on 05/29/2015 with LIMA to LAD, SVG to OM2, SVG to RCA; and Maze procedure and clipping of left atrial appendage. 4. History of pulmonary embolism, bilateral in 2010, spontaneous, Lupus anticoagulant positive. 5. Hypertension 6. History of fibromyalgia 7. Hyperlipidemia - patient has not been able to tolerate statins. Lipid profile 2015: Total cholesterol 218, triglycerides 99, HDL 50, LDL 148. And on 10/16/2015: Total cholesterol 147, triglycerides 49, HDL 47, LDL 90  8.  Chronic renal insufficiency stage III  Probably secondary to hypertension with hypertensive heart disease and congestive heart failure contributing.  Rec:   He did not want to take Haldol.  Slept well with his sister being there is no room.  Underwent pleurocentesis and 2 L.  Fluid withdrawn   Hadpostprocedure temperature raise of 101F  This evening  Patient is willing to stay overnight.  Discussed with his wife.  I do not think there is any acute sepsis, however  I'll have a low threshold to perform blood cultures if he were to have recurrence of fever.  We'll continue to follow for now, serum creatinine is stable on ARB and also spironolactone.  We will recheck his BMP, CBC and BNP in the morning and if he remains stable we'll potentially discharge him.  Significant improvement in chest x-ray findings.  I plan on performing  [place] angiography sometime next week.Marland Kitchen Adrian Prows, M.D. 11/05/2015, 5:52 PM [facility] Pager: [phone] Office: [phone] If no answer: [phone]

## 2015-11-17 NOTE — Discharge Instructions (Signed)

## 2015-11-17 NOTE — Interval H&P Note (Signed)
History and Physical Interval Note:  11/17/2015 7:41 AM  Peter Bruce  has presented today for surgery, with the diagnosis of CAD  The various methods of treatment have been discussed with the patient and family. After consideration of risks, benefits and other options for treatment, the patient has consented to  Procedure(s): Right/Left Heart Cath and Coronary Angiography (N/A) as a surgical intervention .  The patient's history has been reviewed, patient examined, no change in status, stable for surgery.  I have reviewed the patient's chart and labs.  Questions were answered to the patient's satisfaction.     Adrian Prows

## 2015-11-19 ENCOUNTER — Encounter: Payer: Self-pay | Admitting: Thoracic Surgery (Cardiothoracic Vascular Surgery)

## 2015-11-19 ENCOUNTER — Encounter (HOSPITAL_COMMUNITY): Payer: Self-pay

## 2015-11-19 ENCOUNTER — Encounter (HOSPITAL_COMMUNITY)
Admission: RE | Admit: 2015-11-19 | Discharge: 2015-11-19 | Disposition: A | Discharge status: Home / Self Care | Payer: Medicare Other | Source: Ambulatory Visit | Attending: Thoracic Surgery (Cardiothoracic Vascular Surgery) | Admitting: Thoracic Surgery (Cardiothoracic Vascular Surgery)

## 2015-11-19 ENCOUNTER — Ambulatory Visit (INDEPENDENT_AMBULATORY_CARE_PROVIDER_SITE_OTHER): Payer: Medicare Other | Admitting: Thoracic Surgery (Cardiothoracic Vascular Surgery)

## 2015-11-19 ENCOUNTER — Ambulatory Visit (HOSPITAL_COMMUNITY)
Admission: RE | Admit: 2015-11-19 | Discharge: 2015-11-19 | Disposition: A | Discharge status: Home / Self Care | Payer: Medicare Other | Source: Ambulatory Visit | Attending: Thoracic Surgery (Cardiothoracic Vascular Surgery) | Admitting: Thoracic Surgery (Cardiothoracic Vascular Surgery)

## 2015-11-19 VITALS — BP 130/95 | HR 84 | Resp 16 | Ht 70.0 in | Wt 183.0 lb

## 2015-11-19 VITALS — BP 107/69 | HR 81 | Temp 97.5°F | Resp 18 | Ht 70.0 in | Wt 185.3 lb

## 2015-11-19 DIAGNOSIS — I340 Nonrheumatic mitral (valve) insufficiency: Secondary | ICD-10-CM

## 2015-11-19 DIAGNOSIS — I2510 Atherosclerotic heart disease of native coronary artery without angina pectoris: Secondary | ICD-10-CM | POA: Diagnosis not present

## 2015-11-19 DIAGNOSIS — I509 Heart failure, unspecified: Secondary | ICD-10-CM

## 2015-11-19 DIAGNOSIS — R918 Other nonspecific abnormal finding of lung field: Secondary | ICD-10-CM

## 2015-11-19 DIAGNOSIS — Z951 Presence of aortocoronary bypass graft: Secondary | ICD-10-CM

## 2015-11-19 DIAGNOSIS — J90 Pleural effusion, not elsewhere classified: Secondary | ICD-10-CM

## 2015-11-19 DIAGNOSIS — Z01818 Encounter for other preprocedural examination: Secondary | ICD-10-CM | POA: Diagnosis not present

## 2015-11-19 HISTORY — DX: Fibromyalgia: M79.7

## 2015-11-19 LAB — COMPREHENSIVE METABOLIC PANEL
ALBUMIN: 3.5 g/dL (ref 3.5–5.0)
ALK PHOS: 50 U/L (ref 38–126)
ALT: 25 U/L (ref 17–63)
ANION GAP: 9 (ref 5–15)
AST: 25 U/L (ref 15–41)
BUN: 25 mg/dL — ABNORMAL HIGH (ref 6–20)
CHLORIDE: 108 mmol/L (ref 101–111)
CO2: 21 mmol/L — AB (ref 22–32)
Calcium: 9.1 mg/dL (ref 8.9–10.3)
Creatinine, Ser: 1.36 mg/dL — ABNORMAL HIGH (ref 0.61–1.24)
GFR calc Af Amer: >60 mL/min (ref >60)
GFR calc non Af Amer: 52 mL/min — ABNORMAL LOW (ref >60)
GLUCOSE: 102 mg/dL — AB (ref 65–99)
POTASSIUM: 4.3 mmol/L (ref 3.5–5.1)
SODIUM: 138 mmol/L (ref 135–145)
Total Bilirubin: 0.9 mg/dL (ref 0.3–1.2)
Total Protein: 6.6 g/dL (ref 6.5–8.1)

## 2015-11-19 LAB — BLOOD GAS, ARTERIAL
Acid-base deficit: 1.2 mmol/L (ref 0.0–2.0)
BICARBONATE: 22.5 meq/L (ref 20.0–24.0)
Drawn by: 129711
FIO2: 0.21
O2 SAT: 96.6 %
PATIENT TEMPERATURE: 98.6
TCO2: 23.5 mmol/L (ref 0–100)
pCO2 arterial: 34.3 mmHg — ABNORMAL LOW (ref 35.0–45.0)
pH, Arterial: 7.432 (ref 7.350–7.450)
pO2, Arterial: 86.5 mmHg (ref 80.0–100.0)

## 2015-11-19 LAB — PROTIME-INR
INR: 1.33 (ref 0.00–1.49)
Prothrombin Time: 16.6 seconds — ABNORMAL HIGH (ref 11.6–15.2)

## 2015-11-19 LAB — URINALYSIS, ROUTINE W REFLEX MICROSCOPIC
BILIRUBIN URINE: NEGATIVE
GLUCOSE, UA: NEGATIVE mg/dL
HGB URINE DIPSTICK: NEGATIVE
Ketones, ur: NEGATIVE mg/dL
Leukocytes, UA: NEGATIVE
Nitrite: NEGATIVE
PROTEIN: NEGATIVE mg/dL
Specific Gravity, Urine: 1.024 (ref 1.005–1.030)
pH: 5.5 (ref 5.0–8.0)

## 2015-11-19 LAB — TYPE AND SCREEN
ABO/RH(D): A POS
ANTIBODY SCREEN: NEGATIVE

## 2015-11-19 LAB — CBC
HCT: 43.6 % (ref 39.0–52.0)
HEMOGLOBIN: 14.3 g/dL (ref 13.0–17.0)
MCH: 30.9 pg (ref 26.0–34.0)
MCHC: 32.8 g/dL (ref 30.0–36.0)
MCV: 94.2 fL (ref 78.0–100.0)
PLATELETS: 162 10*3/uL (ref 150–400)
RBC: 4.63 MIL/uL (ref 4.22–5.81)
RDW: 15 % (ref 11.5–15.5)
WBC: 8.1 10*3/uL (ref 4.0–10.5)

## 2015-11-19 LAB — APTT: aPTT: 32 seconds (ref 24–37)

## 2015-11-19 NOTE — Patient Instructions (Signed)
  Patient should continue taking all current medications without change through the day before surgery.  Patient should have nothing to eat or drink after midnight the night before surgery.  On the morning of surgery patient should take only metoprolol with a sip of water.  You may also take Valium if needed.

## 2015-11-19 NOTE — Progress Notes (Signed)
PCP - looking for new PCP Cardiologist - Adrian Prows  Chest x-ray - 11/19/15 EKG - 11/03/15 Stress Test -  ECHO - 10/30/15 Cardiac Cath - 11/17/15    Patient denies shortness of breath, fever, cough and chest pain at PAT appointment  Patient was instructed by cardiology to stop taking xarelto on 11/13/15.  Patient followed command.   Sent to anesthesia for review

## 2015-11-19 NOTE — Pre-Procedure Instructions (Signed)
Peter Bruce  11/19/2015      [place], [place] [address] Phone: [phone] Fax: [phone]    Your procedure is scheduled on Friday July 14  Report to [facility] at [place].M.  Call this number if you have problems the morning of surgery:  [phone]   Remember:  Do not eat food or drink liquids after midnight.  Take these medicines the morning of surgery with A SIP OF WATER Acetaminophen (tylenol), diazepam (valium), hydralazine (apresoline), isosorbide Mononitrate (Imdur), metoprolol (toprol)  7 days prior to surgery STOP taking any Aspirin, Aleve, Naproxen, Ibuprofen, Motrin, Advil, Goody's, BC's, all herbal medications, fish oil, and all vitamins    Do not wear jewelry  Do not wear lotions, powders, or cologne.  You may NOT wear deoderant.  Men may shave face and neck.  Do not bring valuables to the hospital.  [facility] is not responsible for any belongings or valuables.  Contacts, dentures or bridgework may not be worn into surgery.  Leave your suitcase in the car.  After surgery it may be brought to your room.  For patients admitted to the hospital, discharge time will be determined by your treatment team.  Patients discharged the day of surgery will not be allowed to drive home.   Special instructions:   - Preparing For Surgery  Before surgery, you can play an important role. Because skin is not sterile, your skin needs to be as free of germs as possible. You can reduce the number of germs on your skin by washing with CHG (chlorahexidine gluconate) Soap before surgery.  CHG is an antiseptic cleaner which kills germs and bonds with the skin to continue killing germs even after washing.  Please do not use if you have an allergy to CHG or antibacterial soaps. If your skin becomes reddened/irritated stop using the CHG.  Do not shave (including legs  and underarms) for at least 48 hours prior to first CHG shower. It is OK to shave your face.  Please follow these instructions carefully.   1. Shower the NIGHT BEFORE SURGERY and the MORNING OF SURGERY with CHG.   2. If you chose to wash your hair, wash your hair first as usual with your normal shampoo.  3. After you shampoo, rinse your hair and body thoroughly to remove the shampoo.  4. Use CHG as you would any other liquid soap. You can apply CHG directly to the skin and wash gently with a scrungie or a clean washcloth.   5. Apply the CHG Soap to your body ONLY FROM THE NECK DOWN.  Do not use on open wounds or open sores. Avoid contact with your eyes, ears, mouth and genitals (private parts). Wash genitals (private parts) with your normal soap.  6. Wash thoroughly, paying special attention to the area where your surgery will be performed.  7. Thoroughly rinse your body with warm water from the neck down.  8. DO NOT shower/wash with your normal soap after using and rinsing off the CHG Soap.  9. Pat yourself dry with a CLEAN TOWEL.   10. Wear CLEAN PAJAMAS   11. Place CLEAN SHEETS on your bed the night of your first shower and DO NOT SLEEP WITH PETS.    Day of Surgery: Do not apply any deodorants/lotions. Please wear clean clothes to the hospital/surgery center.      Please read  over the following fact sheets that you were given. Pain Booklet, Coughing and Deep Breathing, Blood Transfusion Information, MRSA Information, Surgical Site Infection Prevention and Care and Recovery After Surgery

## 2015-11-19 NOTE — Progress Notes (Signed)
[address]       [place] [address]             [phone]     CARDIOTHORACIC SURGERY CONSULTATION REPORT  Referring Provider is Adrian Prows, MD PCP is DOOLITTLE, Linton Ham, MD  Chief Complaint  Patient presents with  . Routine Post Op    S/P CABG/MAZE...Marland Kitchennow referred for severe MR..TEE 10/30/15, CTA C/A/P 11/12/15, CATH 11/17/15  . Mitral Regurgitation    HPI:  Patient is a 65 year old male with history of coronary artery disease, hypertension, borderline hyperlipidemia, lupus anticoagulant positive with remote history of pulmonary embolus, polymyalgia rheumatica, and chronic persistent atrial fibrillation who underwent coronary artery bypass grafting 3 and Maze procedure on 05/29/2015 for severe three-vessel coronary artery disease with abnormal stress test and history of chronic persistent atrial fibrillation. Grafts placed at the time of surgery included left internal mammary artery to the distal left anterior descending coronary artery, saphenous vein graft to the distal right coronary artery, and saphenous vein graft to the second obtuse marginal branch of the left circumflex coronary artery. The terminal portion of the second obtuse marginal branch of the left circumflex coronary artery was notably diffusely diseased. Intraoperative transesophageal echocardiogram performed at the time of surgery revealed normal left ventricular function with only mild central mitral regurgitation. The patient's early postoperative recovery was entirely uncomplicated.  He was last seen in our office on 08/11/2015 at which time he was doing remarkably well, although he appeared to be in rate controlled atrial flutter.  He was seen in follow-up shortly after that by Dr. Martinique and plans for elective DC cardioversion were discussed. Over the next several weeks the patient developed worsening shortness of breath and congestion.  He was seen in urgent care and treated for possible  community acquired pneumonia. He was hospitalized briefly in May with acute exacerbation of chronic diastolic congestive heart failure.  Echocardiogram performed at that time revealed normal left ventricular function with ejection fraction estimated 50-55%. There was reportedly "moderate" mitral regurgitation that was directed posteriorly, severe pulmonary hypertension, and mild to moderate tricuspid regurgitation.  DC cardioversion that was unsuccessful. He was subsequently loaded with amiodarone. Symptoms continued to deteriorate and the patient was readmitted to the hospital 09/27/2015 with respiratory failure and volume overload.  Initially the patient appeared to be in atrial flutter but he spontaneously converted to sinus rhythm. Symptoms improved with medical therapy and he was discharged home. The patient was seen in follow-up on 2 occasions in early June and continued to complain of exertional shortness of breath and fatigue. He ultimately sought a second opinion and was evaluated by Dr. Einar Gip who noted a prominent holosystolic murmur on physical exam. Transthoracic echocardiogram performed in his office suggested the presence of significant mitral regurgitation. The patient underwent transesophageal echocardiogram on 10/30/2015 that revealed severe mitral regurgitation.  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  Left ventricular systolic function appeared normal with ejection fraction estimated 50-55%. No other significant abnormalities were noted.  Chest x-ray performed at that time revealed a large right pleural effusion. The patient underwent thoracentesis yielding more than 1 L of fluid. Symptoms improved considerably.  Left and right heart catheterization was performed 11/17/2015. This revealed patent left internal mammary artery to the distal left anterior descending coronary artery and patent  saphenous vein graft to the distal right coronary artery. The saphenous vein graft to  the second obtuse marginal branch was occluded as was the entire second obtuse marginal branch. There was severe pulmonary hypertension with PA pressures measured 72/27. The patient was referred for surgical consultation.  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 The patient has been retired for several years but works every day helping his wife with the kennel and the 3 acres of property they live on.  The patient states that initially he recovered remarkably well after his surgery last January. He was doing very well when I saw him in early April. He states that several weeks after that he began to develop worsening shortness of breath, congestion, and fatigue. Symptoms have improved recently on medical therapy but he still does not feel any one year as good as he did 3 months ago.  He gets short of breath with activity.  He denies any resting shortness of breath, PND, orthopnea, or lower extremity edema. He has never had any chest pain or chest tightness.  Past Medical History  Diagnosis Date  . Hypertension   . PMR (polymyalgia rheumatica) (HCC)   . Acute pulmonary embolus ([place]) 2010  . Hypercholesterolemia   . Atrial fibrillation [facility])     s/p inpatient DCCV 09/11/2015  . Coronary artery disease involving native coronary artery 05/15/2015    multivessel  . Lupus anticoagulant positive 07/29/2008  . History of kidney stones   . 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 S/P Maze operation for atrial fibrillation 05/29/2015    Complete bilateral atrial lesion set via median sternotomy using bipolar radiofrequency and cryothermy ablation with clipping of LA  appendage  . Atrial flutter ([place])   . Mitral regurgitation 10/30/2015  . Dysrhythmia     A-fib  . CHF (congestive heart failure) ([place])   . Pneumonia   . Arthritis   . Fibromyalgia     Past Surgical History  Procedure Laterality Date  . Appendectomy    . Hernia repair    . Shoulder surgery Left     clavicular fracture  . Arm surgery  Right     wrist and elbow  . Cardiac catheterization N/A 05/15/2015    Procedure: Left Heart Cath and Coronary Angiography;  Surgeon: Peter M Martinique, MD;  Location: [facility];  Service: Cardiovascular;  Laterality: N/A;  . Coronary artery bypass graft N/A 05/29/2015    Procedure: CORONARY ARTERY BYPASS GRAFTING (CABG), ON PUMP, TIMES THREE, USING LEFT INTERNAL MAMMARY ARTERY, BILATERAL GREATER SAPHENOUS VEINS HARVESTED ENDOSCOPICALLY;  Surgeon: Rexene Alberts, MD;  Location: [place];  Service: Open Heart Surgery;  Laterality: N/A;  . Maze N/A 05/29/2015    Procedure: MAZE;  Surgeon: Rexene Alberts, MD;  Location: [facility];  Service: Open Heart Surgery;  Laterality: N/A;  Complete Bi-Atrial Lesion set with Ablation and Cryothermy  . Tee without cardioversion N/A 05/29/2015    Procedure: TRANSESOPHAGEAL ECHOCARDIOGRAM (TEE);  Surgeon: Rexene Alberts, MD;  Location: [facility];  Service: Open Heart Surgery;  Laterality: N/A;  . Clipping of atrial appendage  05/29/2015    Procedure: CLIPPING OF LEFT ATRIAL APPENDAGE;  Surgeon: Rexene Alberts, MD;  Location: [facility];  Service: Open Heart Surgery;;  45 AtriClip PRO 145  . Cardioversion N/A 09/11/2015    Procedure: CARDIOVERSION;  Surgeon: Lelon Perla, MD;  Location: [facility] ENDOSCOPY;  Service: Cardiovascular;  Laterality: N/A;  . Tee without cardioversion N/A 10/30/2015    Procedure: TRANSESOPHAGEAL ECHOCARDIOGRAM (TEE);  Surgeon: Adrian Prows, MD;  Location: [facility] ENDOSCOPY;  Service: Cardiovascular;  Laterality: N/A;  . Fracture surgery      right wrist  . Cardiac catheterization N/A 11/17/2015    Procedure: Right/Left  Heart Cath and Coronary/Graft Angiography;  Surgeon: Adrian Prows, MD;  Location: [facility];  Service: Cardiovascular;  Laterality: N/A;  . Peripheral vascular catheterization Bilateral 11/17/2015    Procedure: Renal Angiography;  Surgeon: Adrian Prows, MD;  Location: [place];  Service: Cardiovascular;  Laterality: Bilateral;  . Peripheral vascular catheterization N/A 11/17/2015    Procedure: Abdominal Aortogram;  Surgeon: Adrian Prows, MD;  Location: [place] [facility];  Service: Cardiovascular;  Laterality: N/A;    Family History  Problem Relation Age of Onset  . Cancer Mother   . Other Sister     chf    Social History   Social History  . Marital Status: Married    Spouse Name: N/A  . Number of Children: N/A  . Years of Education: N/A   Occupational History  . licensed Chief Financial Officer     self-employed   Social History Main Topics  . Smoking status: Never Smoker   . Smokeless tobacco: Never Used  . Alcohol Use: No  . Drug Use: No  . Sexual Activity: Not on file   Other Topics Concern  . Not on file   Social History Narrative   Lives with his wife (married 1990).  Former Health visitor. Body building/weight lifting, running for exercise.    Current Outpatient Prescriptions  Medication Sig Dispense Refill  . acetaminophen (TYLENOL) 500 MG tablet Take 1,000 mg by mouth every 8 (eight) hours as needed for mild pain or moderate pain.    . diazepam (VALIUM) 10 MG tablet Take 10 mg by mouth every 6 (six) hours as needed for anxiety.    . furosemide (LASIX) 40 MG tablet Take 1 tablet (40 mg total) by mouth as needed for edema. (Patient taking differently: Take 20 mg by mouth 2 (two) times daily. ) 30 tablet 1  . hydrALAZINE (APRESOLINE) 25 MG tablet Take 1 tablet (25 mg total) by mouth every 8 (eight) hours. 90 tablet 0  . isosorbide mononitrate (IMDUR) 120 MG 24 hr tablet Take 1 tablet (120 mg total) by mouth daily. 30 tablet 1  . metoprolol succinate  (TOPROL-XL) 25 MG 24 hr tablet Take 1 tablet (25 mg total) by mouth daily. 90 tablet 1  . Rivaroxaban (XARELTO) 15 MG TABS tablet Take 1 tablet (15 mg total) by mouth daily with supper. 30 tablet 1  . valsartan (DIOVAN) 160 MG tablet Take 1 tablet (160 mg total) by mouth daily.    Marland Kitchen zolpidem (AMBIEN) 10 MG tablet Take 10 mg by mouth daily.      No current facility-administered medications for this visit.    Allergies  Allergen Reactions  . Amitriptyline Shortness Of Breath  . Heparin Other (See Comments)    HIT Ab positive on 05/31/15, SRA NEGATIVE on 06/02/15  Patient can not remember what happened   . Other Nausea And Vomiting    SSRI uptake: makes him extremely sick  . Serotonin Reuptake Inhibitors (Ssris) Nausea And Vomiting    SSRI uptake makes him  extremely sick.      Review of Systems:   General:  decreased appetite, decreased energy, no weight gain, no weight loss, no fever  Cardiac:  no chest pain with exertion, no chest pain at rest, +SOB with exertion, no resting SOB, no PND, no orthopnea, no palpitations, + arrhythmia, + atrial fibrillation, no LE edema, no dizzy spells, no syncope  Respiratory:  + shortness of breath, + home oxygen, no productive cough, + dry cough, no bronchitis, no wheezing, no hemoptysis, no asthma, no pain with inspiration or cough, no sleep apnea, no CPAP at night  GI:   no difficulty swallowing, no reflux, no frequent heartburn, no hiatal hernia, no abdominal pain, no constipation, no diarrhea, no hematochezia, no hematemesis, no melena  GU:   no dysuria,  no frequency, no urinary tract infection, no hematuria, no enlarged prostate, no kidney stones, no kidney disease  Vascular:  no pain suggestive of claudication, no pain in feet, no leg cramps, no varicose veins, no DVT, no non-healing foot ulcer  Neuro:   no stroke, no TIA's, no seizures, no headaches, no temporary blindness one eye,  no slurred speech, no peripheral neuropathy, no chronic pain, no  instability of gait, no memory/cognitive dysfunction  Musculoskeletal: no arthritis, no joint swelling, no myalgias, no difficulty walking, normal mobility   Skin:   no rash, no itching, no skin infections, no pressure sores or ulcerations  Psych:   no anxiety, + depression, + nervousness, + unusual recent stress  Eyes:   no blurry vision, no floaters, no recent vision changes, does not wear glasses or contacts  ENT:   no hearing loss, no loose or painful teeth, no dentures  Hematologic:  no easy bruising, no abnormal bleeding, no clotting disorder, no frequent epistaxis  Endocrine:  no diabetes, does not check CBG's at home     Physical Exam:   BP 130/95 mmHg  Pulse 84  Resp 16  Ht 5\' 10"  (1.778 m)  Wt 183 lb (83.008 kg)  BMI 26.26 kg/m2  SpO2 93%  General:    well-appearing  HEENT:  Unremarkable   Neck:   no JVD, no bruits, no adenopathy   Chest:   clear to auscultation, symmetrical breath sounds, no wheezes, no rhonchi   CV:   RRR, grade III/VI holosystolic murmur best at apex  Abdomen:  soft, non-tender, no masses   Extremities:  warm, well-perfused, pulses palpable, no LE edema  Rectal/GU  Deferred  Neuro:   Grossly non-focal and symmetrical throughout  Skin:   Clean and dry, no rashes, no breakdown   Diagnostic Tests:  CARDIAC CATHETERIZATION Procedures    Left Heart Cath and Coronary Angiography    Conclusion     Mid RCA lesion, 70% stenosed.  Mid RCA to Dist RCA lesion, 85% stenosed.  Acute Mrg lesion, 95% stenosed.  Prox LAD to Mid LAD lesion, 90% stenosed.  Mid LAD lesion, 100% stenosed.  1st Diag lesion, 95% stenosed.  Ost 1st Mrg lesion, 80% stenosed.  Mid Cx lesion, 90% stenosed.  3rd Mrg lesion, 85% stenosed.  The left ventricular systolic function is normal.  1. Severe 3 vessel obstructive CAD 2. Low normal LV systolic function  Plan: Patient has severe 3 vessel disease with preserved LV function. Syntax score is high. Recommend CABG  with possible MAZE procedure.     Indications    Abnormal nuclear stress test [R93.1 (ICD-10-CM)]    Technique and Indications    Indication: 65 yo WM with  exertional fatigue and newly diagnosed atrial fibrillation. Abnormal stress Myoview study.  Procedural Details: The right wrist was prepped, draped, and anesthetized with 1% lidocaine. Using the modified Seldinger technique, a 6 French slender sheath was introduced into the right radial artery. 3 mg of verapamil was administered through the sheath, weight-based unfractionated heparin was administered intravenously. Standard Judkins catheters were used for selective coronary angiography and left ventriculography. Catheter exchanges were performed over an exchange length guidewire. There were no immediate procedural complications. A TR band was used for radial hemostasis at the completion of the procedure. The patient was transferred to the post catheterization recovery area for further monitoring.  Contrast 80 ccEstimated blood loss <50 mL. There were no immediate complications during the procedure.    Coronary Findings    Dominance: Right   Left Anterior Descending   . Prox LAD to Mid LAD lesion, 90% stenosed. Diffuse.   . Mid LAD lesion, 100% stenosed. Calcified.   . First Diagonal Branch   . 1st Diag lesion, 95% stenosed. Discrete.   . Third Septal Branch   Ost 3rd Sept filled by collaterals from Acute Mrg.     Left Circumflex   . Mid Cx lesion, 90% stenosed.   . First Obtuse Marginal Branch   . Ost 1st Mrg lesion, 80% stenosed.   . Third Obtuse Marginal Branch   . 3rd Mrg lesion, 85% stenosed. Diffuse.     Right Coronary Artery   . Mid RCA lesion, 70% stenosed. Calcified discrete.   . Mid RCA to Dist RCA lesion, 85% stenosed. Moderately Calcified discrete.   . Acute Marginal Branch   . Acute Mrg lesion, 95% stenosed. Discrete.      Wall Motion                 Left Heart    Left Ventricle The left  ventricular size is normal. The left ventricular systolic function is normal. There are wall motion abnormalities in the left ventricle. Mid anterior hypokinesis There are segmental wall motion abnormalities in the left ventricle.    Coronary Diagrams    Diagnostic Diagram            Implants     No implant documentation for this case.    PACS Images    Show images for Cardiac catheterization     Link to Procedure Log    Procedure Log      Hemo Data       Most Recent Value   AO Systolic Pressure  [id number] mmHg   AO Diastolic Pressure  87 mmHg   AO Mean  [id number] mmHg   LV Systolic Pressure  [id number] mmHg   LV Diastolic Pressure  2 mmHg   LV EDP  11 mmHg   Arterial Occlusion Pressure Extended Systolic Pressure  0000000 mmHg   Arterial Occlusion Pressure Extended Diastolic Pressure  80 mmHg   Arterial Occlusion Pressure Extended Mean Pressure  98 mmHg   Left Ventricular Apex Extended Systolic Pressure  [id number] mmHg   Left Ventricular Apex Extended Diastolic Pressure  3 mmHg   Left Ventricular Apex Extended EDP Pressure  12 mmHg       Intraoperative Transesophageal Echocardiography  Patient: Schawn, Tanksley MR #: [id number] Study Date: 05/29/2015 Gender: M Age: 53 Height: 177.8 cm Weight: 87.1 kg BSA: 2.09 m^2 Pt. Status: Room:  ADMITTING Darylene Price, M.D. ATTENDING Darylene Price, M.D. ORDERING Darylene Price, M.D. REFERRING Darylene Price, M.D. SONOGRAPHER [facility] PERFORMING Hoy Morn, MD  cc:  ------------------------------------------------------------------- LV EF: 50% - 55%  ------------------------------------------------------------------- Indications: CAD of native vessels 414.01.  ------------------------------------------------------------------- History: PMH: Atrial fibrillation. Risk factors: Hypertension.  Dyslipidemia.  ------------------------------------------------------------------- Study Conclusions  - Left ventricle: Systolic function was normal. The estimated  ejection fraction was in the range of 50% to 55%. Wall motion was  normal; there were no regional wall motion abnormalities. - Aortic valve: There was trivial regurgitation. - Mitral valve: There was mild regurgitation. - Right atrium: No evidence of thrombus in the atrial cavity or  appendage. - Atrial septum: No defect or patent foramen ovale was identified.  Echo contrast study showed no right-to-left atrial level shunt,  following an increase in RA pressure induced by provocative  maneuvers. - Tricuspid valve: No evidence of vegetation. - Pulmonic valve: No evidence of vegetation.  Intraoperative transesophageal echocardiography. M-mode, complete 2D, spectral Doppler, and color Doppler. Birthdate: Patient birthdate: 28-Nov-1950. Age: Patient is 65 yr old. Sex: Gender: male. BMI: 27.5 kg/m^2. Blood pressure: 151/97 Patient status: Inpatient. Study date: Study date: 05/29/2015. Study time: 09:05 AM. Location: Operating room.  -------------------------------------------------------------------  ------------------------------------------------------------------- Left ventricle: Systolic function was normal. The estimated ejection fraction was in the range of 50% to 55%. Wall motion was normal; there were no regional wall motion abnormalities.  ------------------------------------------------------------------- Aortic valve: Trileaflet. Doppler: There was trivial regurgitation. Mean gradient (S): 2 mm Hg. Peak gradient (S): 5 mm Hg.  ------------------------------------------------------------------- Aorta: The aorta was mildly calcified. There was no evidence for dissection.  ------------------------------------------------------------------- Mitral valve: Mildly  thickened leaflets . Doppler: There was mild regurgitation.  ------------------------------------------------------------------- Left atrium: The atrium was normal in size.  ------------------------------------------------------------------- Atrial septum: No defect or patent foramen ovale was identified. Echo contrast study showed no right-to-left atrial level shunt, following an increase in RA pressure induced by provocative maneuvers.  ------------------------------------------------------------------- Right ventricle: The cavity size was normal. Wall thickness was normal. Systolic function was normal.  ------------------------------------------------------------------- Pulmonic valve: Structurally normal valve. Cusp separation was normal. No evidence of vegetation.  ------------------------------------------------------------------- Tricuspid valve: Structurally normal valve. Leaflet separation was normal. No evidence of vegetation. Doppler: There was no regurgitation.  ------------------------------------------------------------------- Right atrium: The atrium was normal in size. No evidence of thrombus in the atrial cavity or appendage.  ------------------------------------------------------------------- Pericardium: The pericardium was normal in appearance. There was no pericardial effusion.  ------------------------------------------------------------------- Pre bypass:  Post bypass:  - LV size was normal and unchanged from the prior stage. - LV global systolic function was appropriately augmented from  baseline. The estimated LV ejection fraction was 50%. - There is no diffuse LV hypokinesis. - Normal wall motion; no LV regional wall motion abnormalities.  - RV global systolic function was normal. - No dissection noted in aorta.  Pre bypass: Post  bypass:  ------------------------------------------------------------------- Post procedure conclusions Ascending Aorta:  - The aorta was mildly calcified.  ------------------------------------------------------------------- Measurements  Aortic valve Value Aortic valve peak velocity, S 109 cm/s Aortic valve mean velocity, S 68.4 cm/s Aortic valve VTI, S 22.3 cm Aortic mean gradient, S 2 mm Hg Aortic peak gradient, S 5 mm Hg  Legend: (L) and (H) mark values outside specified reference range.  ------------------------------------------------------------------- Prepared and Electronically Authenticated by  Hoy Morn, MD 2017-01-24T09:26:19   Transthoracic Echocardiography  Patient: Ziggy, [place] MR #: [id number] Study Date: 09/09/2015 Gender: M Age: 18 Height: 175.3 cm Weight: 85.3 kg BSA: 2.05 m^2 Pt. Status: Room: [place] MD [place] MD ORDERING Lyman Bishop MD Lea MD PERFORMING [facility], Inpatient SONOGRAPHER  Darlina Sicilian, RDCS  cc:  ------------------------------------------------------------------- LV EF: 50% - 55%  ------------------------------------------------------------------- Indications: CHF - 428.0.  ------------------------------------------------------------------- History: PMH: Lupus. Dyspnea. Atrial flutter. Atrial fibrillation. PMH: Pulmonary Embolism. Pneumonia. CABG with a MAZE 1/119/2017. Risk factors: Hypertension. Dyslipidemia.  ------------------------------------------------------------------- Study Conclusions  - Left ventricle: The cavity size was normal. There was moderate  focal basal hypertrophy of the septum with mild posterior wall  hypertrophy. Systolic function was normal. The  estimated ejection  fraction was in the range of 50% to 55%. Wall motion was normal;  there were no regional wall motion abnormalities. - Aortic valve: Transvalvular velocity was within the normal range.  There was no stenosis. There was no regurgitation. - Mitral valve: Transvalvular velocity was within the normal range.  There was no evidence for stenosis. There was moderate  regurgitation directed posteriorly. - Left atrium: The atrium was moderately dilated. - Right ventricle: The cavity size was normal. Wall thickness was  normal. Systolic function was normal. - Tricuspid valve: There was mild-moderate regurgitation. - Pulmonary arteries: Systolic pressure was severely increased. PA  peak pressure: 67 mm Hg (S). - Inferior vena cava: The vessel was dilated. The respirophasic  diameter changes were blunted (< 50%), consistent with elevated  central venous pressure. - Pericardium, extracardiac: There was a left pleural effusion.  Transthoracic echocardiography. M-mode, complete 2D, spectral Doppler, and color Doppler. Birthdate: Patient birthdate: [birth year]. Age: Patient is 65 yr old. Sex: Gender: male. BMI: 27.8 kg/m^2. Blood pressure: 132/102 Patient status: Inpatient. Study date: Study date: 09/09/2015. Study time: 02:05 PM. Location: ICU/CCU  -------------------------------------------------------------------  ------------------------------------------------------------------- Left ventricle: The cavity size was normal. There was moderate focal basal hypertrophy of the septum with mild posterior wall hypertrophy. Systolic function was normal. The estimated ejection fraction was in the range of 50% to 55%. Wall motion was normal; there were no regional wall motion abnormalities. The study was not technically sufficient to allow evaluation of LV diastolic dysfunction due to atrial  fibrillation.  ------------------------------------------------------------------- Aortic valve: Trileaflet; normal thickness leaflets. Mobility was not restricted. Doppler: Transvalvular velocity was within the normal range. There was no stenosis. There was no regurgitation.  ------------------------------------------------------------------- Aorta: Aortic root: The aortic root was normal in size.  ------------------------------------------------------------------- Mitral valve: Structurally normal valve. Mobility was not restricted. Doppler: Transvalvular velocity was within the normal range. There was no evidence for stenosis. There was moderate regurgitation directed posteriorly. Peak gradient (D): 8 mm Hg.  ------------------------------------------------------------------- Left atrium: The atrium was moderately dilated.  ------------------------------------------------------------------- Right ventricle: The cavity size was normal. Wall thickness was normal. Systolic function was normal.  ------------------------------------------------------------------- Pulmonic valve: Structurally normal valve. Cusp separation was normal. Doppler: Transvalvular velocity was within the normal range. There was no evidence for stenosis. There was no regurgitation.  ------------------------------------------------------------------- Tricuspid valve: Structurally normal valve. Doppler: Transvalvular velocity was within the normal range. There was mild-moderate regurgitation.  ------------------------------------------------------------------- Pulmonary artery: The main pulmonary artery was normal-sized. Systolic pressure was severely increased.  ------------------------------------------------------------------- Right atrium: The atrium was normal in size.  ------------------------------------------------------------------- Pericardium: There  was no pericardial effusion.  ------------------------------------------------------------------- Systemic veins: Inferior vena cava: The vessel was dilated. The respirophasic diameter changes were blunted (< 50%), consistent with elevated central venous pressure.  ------------------------------------------------------------------- Pleura: There was a left pleural effusion.  ------------------------------------------------------------------- Measurements  Left ventricle Value Reference LV ID, ED, PLAX chordal 43.4 mm 43 - 52 LV ID, ES, PLAX chordal 34.9 mm 23 - 38 LV fx shortening, PLAX chordal (L) 20 % >=29 LV PW thickness, ED 12.6 mm ---------  IVS/LV PW ratio, ED 1.25 <=1.3 LV ejection fraction, 1-p A4C 54 % --------- LV end-diastolic volume, 2-p [id number] ml --------- LV end-systolic volume, 2-p 50 ml --------- LV ejection fraction, 2-p 51 % --------- Stroke volume, 2-p 53 ml --------- LV end-diastolic volume/bsa, 2-p 50 ml/m^2 --------- LV end-systolic volume/bsa, 2-p 24 ml/m^2 --------- Stroke volume/bsa, 2-p 25.8 ml/m^2 ---------  Ventricular septum Value Reference IVS thickness, ED 15.7 mm ---------  LVOT Value Reference LVOT ID, S 21 mm --------- LVOT area 3.46 cm^2 ---------  Aorta Value Reference Aortic root ID, ED 35 mm ---------  Left  atrium Value Reference LA ID, A-P, ES 48 mm --------- LA ID/bsa, A-P (H) 2.34 cm/m^2 <=2.2 LA volume, S 76 ml --------- LA volume/bsa, S 37 ml/m^2 --------- LA volume, ES, 1-p A4C 69 ml --------- LA volume/bsa, ES, 1-p A4C 33.6 ml/m^2 --------- LA volume, ES, 1-p A2C 71 ml --------- LA volume/bsa, ES, 1-p A2C 34.6 ml/m^2 ---------  Mitral valve Value Reference Mitral E-wave peak velocity 139 cm/s --------- Mitral deceleration time (L) 123 ms 150 - 230 Mitral peak gradient, D 8 mm Hg --------- Mitral maximal regurg velocity, 502 cm/s --------- PISA Mitral regurg VTI, PISA 112 cm --------- Mitral ERO, PISA 0.37 cm^2 --------- Mitral regurg volume, PISA 41 ml ---------  Pulmonary arteries Value Reference PA pressure, S, DP (H) 67 mm Hg <=30  Tricuspid valve Value Reference Tricuspid regurg peak velocity 361 cm/s --------- Tricuspid peak RV-RA gradient 52 mm Hg ---------  Systemic veins Value Reference Estimated CVP 15 mm Hg ---------  Right ventricle Value Reference RV pressure, S, DP (H) 67 mm Hg <=30 RV s&', lateral, S 10.6 cm/s ---------  Legend: (L) and (H) mark values outside specified reference  range.  ------------------------------------------------------------------- Prepared and Electronically Authenticated by  Skeet Latch, MD 2017-05-02T15:14:53   Transesophageal Echocardiography  (Report amended )  Patient: Kylle, Foskett MR #: [id number] Study Date: 10/30/2015 Gender: M Age: 9 Height: 177.8 cm Weight: 85.3 kg BSA: 2.07 m^2 Pt. Status: Room:  ATTENDING Adrian Prows, MD PERFORMING Adrian Prows, MD SONOGRAPHER Johny Chess, RDCS, CCT ADMITTING Farrel Gordon R  cc:  ------------------------------------------------------------------- LV EF: 50% - 55%  ------------------------------------------------------------------- Indications: Mitral regurgitation 424.0.  ------------------------------------------------------------------- Study Conclusions  - Left ventricle: Systolic function was normal. The estimated  ejection fraction was in the range of 50% to 55%. - Aortic valve: There was trivial regurgitation. - Aorta: There was mild atheromatous plaque in the descending  aorta. - Mitral valve: Mild thickening of the anterior leaflet, consistent  with myxomatous proliferation. Severe, holosystolicprolapse,  involving the anterior leaflet. There was malcoaptation of the  valve leaflets. There was severe regurgitation directed  posteriorly. - Left atrium: The atrium was dilated. - Right ventricle: The cavity size was mildly dilated. Systolic  function was reduced. - Atrial septum: No defect or patent foramen ovale was identified.  Impressions:  - Normal pulmonary artery pressure.  Diagnostic transesophageal echocardiography. 2D and color Doppler. Birthdate: Patient birthdate: [birth year]. Age: Patient is 65 yr old. Sex: Gender: male. BMI: 27 kg/m^2. Blood pressure: 136/81 Patient status: Outpatient. Study date: Study  date: 10/30/2015. Study time: 08:05 AM. Location: Endoscopy.  -------------------------------------------------------------------  ------------------------------------------------------------------- Left ventricle: Systolic function was normal. The estimated ejection fraction was in the range of 50% to 55%.  ------------------------------------------------------------------- Aortic valve: Structurally normal valve. Cusp separation was normal. Doppler: There was trivial regurgitation.  ------------------------------------------------------------------- Aorta: There was mild atheromatous plaque in the descending aorta.  ------------------------------------------------------------------- Mitral valve: Mildly thickened leaflets .  Mild thickening of the anterior leaflet, consistent with myxomatous proliferation. Severe, holosystolicprolapse, involving the anterior leaflet. There was malcoaptation of the valve leaflets. Doppler: There was severe regurgitation directed posteriorly.  ------------------------------------------------------------------- Left atrium: The atrium was dilated. The appendage was not visualized. S/P Ligation during CABG  ------------------------------------------------------------------- Atrial septum: No defect or patent foramen ovale was identified.  ------------------------------------------------------------------- Right ventricle: The cavity size was mildly dilated. Systolic function was reduced.  ------------------------------------------------------------------- Pulmonic valve: Structurally normal valve. Cusp separation was normal. Doppler: There was trivial regurgitation.  ------------------------------------------------------------------- Tricuspid valve: Structurally normal valve. Leaflet separation was normal. Doppler: There was mild  regurgitation.  ------------------------------------------------------------------- Right atrium: The atrium was normal in size. No evidence of thrombus in the atrial cavity or appendage.  ------------------------------------------------------------------- Pericardium: The pericardium was normal in appearance. There was no pericardial effusion.  ------------------------------------------------------------------- Measurements  Mitral valve Value Mitral annulus diameter, A-P 35 mm Mitral regurg VTI, PISA 129 cm Mitral ERO, PISA 0.52 cm^2 Mitral regurg volume, PISA 67 ml  Legend: (L) and (H) mark values outside specified reference range.  ------------------------------------------------------------------- Benard Rink, MD 2017-06-23T06:19:17    CARDIAC CATHETERIZATION  Procedures    Abdominal Aortogram   Renal Angiography   Right/Left Heart Cath and Coronary/Graft Angiography    Conclusion     Ost 1st Mrg lesion, 90% stenosed.  Mid Cx lesion, 50% stenosed.  Impression: LVEDP upper end of normal at 17 mmHg. Severe pulmonary hypertension, PA pressure 72/27 with a mean of 47 mmHg. Pulmonary wedge 23/28 with a mean of 24 mmHg. RV 81/3, EDP 13 mmHg. RA 16/16, mean 13 mmHg.  Cardiac output by Fick was 3.29, cardiac index 1.65 by Fick. PA saturation 44%, aortic saturation 88%. By thermodilution, cardiac output 6.41, cardiac index 3.21.  Coronary angiogram: Severe diffuse disease of the native vessels. Distal RCA subtotally occluded, SVG to RCA is widely patent.  Left circumflex diffusely diseased, mid 30-40% stenosis, small OM1, this is followed by a large OM 2 with a ostial 90% stenosis, distal OM 3 is occluded with faint bridging collaterals. SVG to OM 3 is occluded. LAD is diffusely diseased with very small diagonals. High-grade 80-90% stenosis in the proximal segment  followed by 50% stenosis at the origin of a moderate to large size diagonal 2 and LAD is occluded after the origin of D2. Distal LAD is supplied by LIMA which is patent.  Abdominal aortogram: Tortuous abdominal aorta especially suprarenal. Mild calcification. No abdominal aortic aneurysm. Right renal artery inferior pole is a dominant vessel, has a high-grade 80% stenosis. Left renal artery has 30-40% stenosis.     Indications    Mitral regurgitation [I34.0 (ICD-10-CM)]   Renal artery stenosis (HCC) [I70.1 (ICD-10-CM)]    Technique and Indications    Procedures performed: Right and left heart catheterization and calculation of cardiac output and cardiac index by Fick. Abdominal aortogram, selective right and left renal arteriogram. Right femoral arterial 5 French access and left femoral 7 French vein access was utilized for performing the procedure.   Indication: Amin Dorrough is a 65 y.o. male with hyperlipidemia, hypertension, stage 2-3 CKD, Patient with known coronary artery disease and CABG status post Maze procedure and left atrial appendage clipping on 05/29/2015 with LIMA to LAD, SVG to M2, SVG to RCA, who is been having acute recurrent diastolic heart failure, was found to have severe mitral regurgitation by recent TEE. He is now scheduled for cardiac catheterization, that includes left and right to reevaluate coronary anatomy, patency of grafts and also to  evaluate cardiac output and PA pressures. Abdominal aortogram and selective renal arteriogram was performed because of recent CT angiogram of the chest revealing tortuous aorta, bilateral renal artery stenosis. Wanted to confirm severity of stenosis as this may impart significance during mitral valve repair.  A 6.5 French Swan-Ganz catheter was advanced with balloon inflated on the sheath under fluoroscopic guidance into first the right atrium followed by the right ventricle and into the pulmonary artery to pulmonary artery wedge  position. Hemodynamics were obtained in a locations. A 0.025 inch J-wire was utilized to obtain wedge position. After hemodynamics were completed, samples were taken for SaO2% measurement to be used in Fick cardiac output and cardiac index cannulation, thermodilution was also performed. The catheter was then pulled back the balloon down and then completely out of the body.   Left Heart Catheterization   Using safety J-wire, a 58F MP B2 catheter was advanced over standard J-wire into the ascending aorta and used to engage first the Left and saphenous vein graft. Catheter exchanged to a JR4 5 French catheter, right coronary artery was selected and cannulated and angiography was performed. Same catheter was utilized to engage the LIMA. The catheter was then pulled back into the abdominal aorta with a J-wire and abdominal aortogram followed by selective right and left renal artery gram was performed. MP B2 catheter which was used to cross the aortic valve for measurement of Left Ventricular Hemodynamics.Hemostasis was achieved by applying manual pressure..  Conscious sedation protocol was followed, I personally administered conscious sedation and monitored the patient. Patient received 2 mg milligrams of Versed and 50 g fentanyl . Patient tolerated the procedure well and there was no complication from conscious sedation. Time administered was 42 minutes. 65 mL contrast utilized.Estimated blood loss <50 mL. There were no immediate complications during the procedure.    Coronary Findings    Dominance: Right   Left Anterior Descending   . Prox LAD to Mid LAD lesion, 90% stenosed. Diffuse.   . Mid LAD lesion, 100% stenosed. Calcified.   . First Diagonal Branch   . 1st Diag lesion, 95% stenosed. Discrete.   . Third Septal Branch   Ost 3rd Sept filled by collaterals from Acute Mrg.     Left Circumflex   . Mid Cx lesion, 50% stenosed.   . First Obtuse Marginal Branch   The vessel is small in size.  OM2   . Ost 1st Mrg lesion, 90% stenosed.   . Third Obtuse Marginal Branch   . 3rd Mrg lesion, 99% stenosed. Diffuse.     Right Coronary Artery   . Mid RCA lesion, 70% stenosed. Calcified discrete.   . Mid RCA to Dist RCA lesion, 99% stenosed. Moderately Calcified discrete.   . Acute Marginal Branch   . Acute Mrg lesion, 95% stenosed. Discrete.     Graft Angiography    Free Graft to RPDA  SVG is normal in caliber.     Free Graft to 3rd Mrg  SVG   . Origin to Prox Graft lesion, 100% stenosed.     Free LIMA Graft to Dist LAD  LIMA is normal in caliber.           Right Heart Pressures RA 16/16, mean 13 mmHg. RV 81/3, EDP 13 mmHg. PA 72/27, mean 42 mmHg. Wedge 23/28, mean 24 mmHg. Cardiac output by Fick 3.29, cardiac index 1.65. By thermodilution cardiac output 6.41, cardiac index 3.21.  Findings consistent with moderate to severe pulmonary hypertension with elevated LVEDP and  pulmonary Very wedge. Increase cardiac output due to mitral regurgitation. Giant V wave evident on wedge tracing.    Coronary Diagrams    Diagnostic Diagram            Implants     No implant documentation for this case.    PACS Images    Show images for Cardiac catheterization     Link to Procedure Log    Procedure Log      Hemo Data       Most Recent Value   Fick Cardiac Output  3.29 L/min   Fick Cardiac Output Index  1.65 (L/min)/BSA   Thermal Cardiac Output  6.41 L/min   Thermal Cardiac Output Index  3.21 (L/min)/BSA   RA A Wave  16 mmHg   RA V Wave  16 mmHg   RA Mean  13 mmHg   RV Systolic Pressure  81 mmHg   RV Diastolic Pressure  3 mmHg   RV EDP  13 mmHg   PA Systolic Pressure  72 mmHg   PA Diastolic Pressure  27 mmHg   PA Mean  42 mmHg   PW A Wave  23 mmHg   PW V Wave  28 mmHg   PW Mean  24 mmHg   AO Systolic Pressure  123456 mmHg   AO Diastolic Pressure  88 mmHg   AO Mean  [id number] mmHg   LV Systolic Pressure  [id number] mmHg   LV Diastolic  Pressure  7 mmHg   LV EDP  16 mmHg   TPVR Index  13.1 HRUI   TSVR Index  31.82 HRUI   PVR SVR Ratio  0.2   TPVR/TSVR Ratio  0.41    CT ANGIOGRAPHY CHEST, ABDOMEN, PELVIS WITH CONTRAST  TECHNIQUE: Multidetector CT imaging of the chest, abdomen, pelvis was performed using the standard protocol during bolus administration of intravenous contrast. Multiplanar CT image reconstructions and MIPs were obtained to evaluate the vascular anatomy.  CONTRAST: 75 mL Isovue 370 IV  COMPARISON: CTA chest 05/13/2011, abdomen 04/07/2004  FINDINGS: CHEST  Vascular: Right arm IV contrast administration. The SVC is patent. There is additional filling of some body wall collaterals into the azygos venous system. Right atrium, right ventricle, and pulmonary artery branches are incompletely opacified; the exam was not optimized for detection of pulmonary emboli. Patent bilateral pulmonary veins drain into the left atrium which is mildly enlarged. Previous CABG. Adequate contrast opacification of the thoracic aorta with no evidence of dissection, aneurysm, or stenosis. There is classic 3-vessel brachiocephalic arch anatomy without proximal stenosis. Scattered plaque in the arch and descending thoracic aorta without significant intramural thrombus. Moderate tortuosity of the distal descending segment.  Mediastinum/Lymph Nodes: Subcentimeter prevascular, AP window, and precarinal lymph nodes. No hilar adenopathy. No pericardial effusion.  Lungs/Pleura: Moderate right and smaller left pleural effusions, increased since previous exam. Dependent subsegmental atelectasis posteriorly in both lower lobes. Lungs otherwise clear. No pneumothorax.  Musculoskeletal: Previous median sternotomy. Anterior vertebral endplate spurring at multiple levels in the mid and lower thoracic spine. Spondylitic changes in the visualized lower cervical spine.  ABDOMEN  Arterial  Aorta: Moderate  calcified plaque with some mild nonocclusive mural thrombus in the infrarenal segment. No aneurysm, dissection, or stenosis.  Celiac axis: Mild short-segment narrowing at the level of the median arcuate ligament of the diaphragm, patent distally.  Superior mesenteric: Partially calcified nonocclusive ostial plaque, patent distally with classic distal branch anatomy.  Left renal: Duplicated. The superior is diminutive, and supplies a portion  of the lower pole anterior division. The inferior is dominant, with partially calcified ostial plaque extending over length of at least 12 mm, resulting in stenosis of at least mild severity, patent distally.  Right renal: Single, with Irregular partially calcified ostial plaque extending over length of at least 14 mm, resulting in at least moderate stenosis, patent distally.  Inferior mesenteric: Patent  Left iliac: Eccentric partially calcified plaque in the distal common iliac without stenosis. Irregular plaque in the internal iliac which is ectatic up to a diameter of 12 mm, with some eccentric mural thrombus in the ectatic segment. There is mild tortuosity of the external iliac which is normal in caliber without significant atheromatous change. No dissection or stenosis.  Right iliac: Scattered calcified plaque through the common iliac and in the proximal internal iliac artery. Mild tortuosity of the external iliac without aneurysm, dissection, or stenosis.  Venous  Dedicated venous phase imaging not obtained. Note made of patent splenic and portal veins. Left pelvic phleboliths.  Review of the MIP images confirms the above findings.  Nonvasular  Hepatobiliary: No masses or other significant liver abnormality. Several subcentimeter partially calcified stones layer in the dependent aspect of the nondistended gallbladder. No intra or extrahepatic biliary ductal dilatation.  Pancreas: No mass, inflammatory changes, or  other significant abnormality.  Spleen: Within normal limits in size and appearance.  Adrenals/Urinary Tract: No masses identified. No evidence of hydronephrosis.  Stomach/Bowel: No evidence of obstruction, inflammatory process, or abnormal fluid collections. Innumerable descending and sigmoid diverticula without significant adjacent inflammatory/edematous change.  Lymphatic: No pathologically enlarged lymph nodes.  Reproductive: Moderate prostatic enlargement with central coarse calcification.  Other: There is a small amount of ascites in the right pericolic gutter, pelvis, and perihepatic. No free air.  Musculoskeletal: Moderate narrowing of the L2-3 interspace. Grade 1 anterolisthesis L4-5 probably secondary to advanced facet DJD at this level. Negative for fracture. Mild spurring in bilateral hips.  IMPRESSION: 1. Moderate tortuosity of the distal descending thoracic aorta and mild tortuosity of the iliac arterial systems bilaterally. 2. Mild scattered nonocclusive mural thrombus in the infrarenal abdominal aorta. 3. No significant aortoiliac occlusive disease. 4. Progression of moderate right and smaller left pleural effusions. 5. Small amount of abdominal ascites. 6. Bilateral ostial renal artery stenosis, right greater than left. 7. Descending and sigmoid diverticulosis. 8. Cholelithiasis   Electronically Signed  By: Lucrezia Europe M.D.  On: 11/12/2015 15:44    Impression:  Patient has relatively acute onset of stage D severe symptomatic mitral regurgitation. I have personally reviewed the patient's recent transesophageal echocardiogram and compared it with the transesophageal echocardiogram performed at the time of his surgery last January. There has been a dramatic change in the appearance of his mitral valve during the interim period of time. Last January the patient had mild (1+) central mitral regurgitation with normal leaflet mobility (type I  dysfunction). There was some annular dilatation present at that time. In contrast, recent transesophageal echocardiogram reveals severe (4+) mitral regurgitation with an eccentric jet that is directed posteriorly. The anterior leaflet appears to be overriding the posterior leaflet to some degree and there may be some myxomatous change and prolapse although there are no obvious ruptured chordae tendonae. The posterior leaflet does not appear to be significantly restricted and the functional anatomy is not suggestive of underlying ischemic etiology . In addition, the patient is currently in sinus rhythm and left ventricular function appears essentially normal with no significant wall motion abnormality.  It is unclear why the patient's  mitral regurgitation has gotten so much worse.  However, I agree the patient needs surgical intervention. Follow-up diagnostic catheterization demonstrates occlusion of the vein graft to the second obtuse marginal branch of the left circumflex coronary artery. There remains continued patency of the left internal mammary artery to the distal left anterior descending coronary artery and the saphenous vein graft to the distal right coronary artery. The second obtuse marginal branch of the left circumflex coronary artery was notably diffusely diseased at the time of his original surgery. The patient is not having signs or symptoms of ischemia. I do not feel that redo coronary bypass grafting should be attempted. Moreover, the risks associated with redo surgery may be mitigated using a right mini thoracotomy approach for mitral valve repair/replacement.   Plan:  The patient and his wife were counseled at length regarding the indications, risks and potential benefits of mitral valve repair.  We directly reviewed the patient's recent transesophageal echocardiogram and compared it with the transesophageal echocardiogram performed at the time of his surgery last January. We also reviewed  his recent catheterization and compared it with the diagnostic catheterization performed 6 months ago. The rationale for elective surgery has been explained, including a comparison between surgery and continued medical therapy with close follow-up.  Options for management of the patient's underlying coronary artery disease were discussed, as were reasons why I feel that redo coronary artery bypass grafting is not indicated at this time. The likelihood of successful and durable mitral valve repair has been discussed with particular reference to the findings of their recent echocardiogram.  Based upon these findings and previous experience, I have quoted them a greater than 50 percent likelihood of successful valve repair.  In the unlikely event that their valve cannot be successfully repaired, we discussed the possibility of replacing the mitral valve using a mechanical prosthesis with the attendant need for long-term anticoagulation versus the alternative of replacing it using a bioprosthetic tissue valve with its potential for late structural valve deterioration and failure, depending upon the patient's longevity.  The patient specifically requests that if the mitral valve must be replaced that it be done using a bioprosthetic tissue valve.   The patient understands and accepts all potential risks of surgery including but not limited to risk of death, stroke or other neurologic complication, myocardial infarction, congestive heart failure, respiratory failure, renal failure, bleeding requiring transfusion and/or reexploration, arrhythmia, infection or other wound complications, pneumonia, pleural and/or pericardial effusion, pulmonary embolus, aortic dissection or other major vascular complication, or delayed complications related to valve repair or replacement including but not limited to structural valve deterioration and failure, thrombosis, embolization, endocarditis, or paravalvular leak.  Alternative surgical  approaches have been discussed including a comparison between conventional sternotomy and minimally-invasive techniques.  The relative risks and benefits of each have been reviewed as they pertain to the patient's specific circumstances, and all of their questions have been addressed.  Specific risks potentially related to the minimally-invasive approach were discussed at length, including but not limited to risk of conversion to full or partial sternotomy, aortic dissection or other major vascular complication, unilateral acute lung injury or pulmonary edema, phrenic nerve dysfunction or paralysis, rib fracture, chronic pain, lung hernia, or lymphocele.   All of their questions have been answered.  We plan to proceed with surgery on Friday, 11/21/2015.   I spent in excess of 90 minutes during the conduct of this office consultation and >50% of this time involved direct face-to-face encounter with the patient  for counseling and/or coordination of their care.   Valentina Gu. Roxy Manns, MD 11/19/2015 3:45 PM

## 2015-11-20 ENCOUNTER — Encounter: Payer: Medicare Other | Admitting: Thoracic Surgery (Cardiothoracic Vascular Surgery)

## 2015-11-20 ENCOUNTER — Other Ambulatory Visit: Payer: Self-pay | Admitting: *Deleted

## 2015-11-20 DIAGNOSIS — I340 Nonrheumatic mitral (valve) insufficiency: Secondary | ICD-10-CM

## 2015-11-20 LAB — HEMOGLOBIN A1C
Hgb A1c MFr Bld: 5.6 % (ref 4.8–5.6)
MEAN PLASMA GLUCOSE: 114 mg/dL

## 2015-11-20 MED ORDER — VANCOMYCIN HCL 10 G IV SOLR
1250.0000 mg | INTRAVENOUS | Status: AC
  Administered 2015-11-21: 1250 mg via INTRAVENOUS
  Filled 2015-11-20: qty 1250

## 2015-11-20 MED ORDER — DEXMEDETOMIDINE HCL IN NACL 400 MCG/100ML IV SOLN
0.1000 ug/kg/h | INTRAVENOUS | Status: AC
  Administered 2015-11-21: 15:00:00 via INTRAVENOUS
  Administered 2015-11-21: .3 ug/kg/h via INTRAVENOUS
  Filled 2015-11-20: qty 100

## 2015-11-20 MED ORDER — DEXTROSE 5 % IV SOLN
750.0000 mg | INTRAVENOUS | Status: DC
  Filled 2015-11-20: qty 750

## 2015-11-20 MED ORDER — POTASSIUM CHLORIDE 2 MEQ/ML IV SOLN
80.0000 meq | INTRAVENOUS | Status: DC
  Filled 2015-11-20: qty 40

## 2015-11-20 MED ORDER — EPINEPHRINE HCL 1 MG/ML IJ SOLN
0.0000 ug/min | INTRAVENOUS | Status: AC
  Administered 2015-11-21: 2.67 ug/min via INTRAVENOUS
  Filled 2015-11-20: qty 4

## 2015-11-20 MED ORDER — AMINOCAPROIC ACID 250 MG/ML IV SOLN
INTRAVENOUS | Status: AC
  Administered 2015-11-21: 13:00:00 via INTRAVENOUS
  Administered 2015-11-21: 69.8 mL/h via INTRAVENOUS
  Filled 2015-11-20: qty 40

## 2015-11-20 MED ORDER — NITROGLYCERIN IN D5W 200-5 MCG/ML-% IV SOLN
2.0000 ug/min | INTRAVENOUS | Status: DC
  Filled 2015-11-20: qty 250

## 2015-11-20 MED ORDER — DOPAMINE-DEXTROSE 3.2-5 MG/ML-% IV SOLN
0.0000 ug/kg/min | INTRAVENOUS | Status: DC
  Filled 2015-11-20: qty 250

## 2015-11-20 MED ORDER — PAPAVERINE HCL 30 MG/ML IJ SOLN
INTRAMUSCULAR | Status: AC
  Administered 2015-11-21: 500 mL
  Filled 2015-11-20: qty 2.5

## 2015-11-20 MED ORDER — MAGNESIUM SULFATE 50 % IJ SOLN
40.0000 meq | INTRAMUSCULAR | Status: DC
  Filled 2015-11-20: qty 10

## 2015-11-20 MED ORDER — DEXTROSE 5 % IV SOLN
1.5000 g | INTRAVENOUS | Status: AC
  Administered 2015-11-21: 750 g via INTRAVENOUS
  Filled 2015-11-20 (×2): qty 1.5

## 2015-11-20 MED ORDER — PHENYLEPHRINE HCL 10 MG/ML IJ SOLN
30.0000 ug/min | INTRAVENOUS | Status: AC
  Administered 2015-11-21: 20 ug/min via INTRAVENOUS
  Filled 2015-11-20: qty 2

## 2015-11-20 MED ORDER — VANCOMYCIN HCL 1000 MG IV SOLR
INTRAVENOUS | Status: AC
  Administered 2015-11-21: 1000 mL
  Filled 2015-11-20: qty 1000

## 2015-11-20 MED ORDER — CHLORHEXIDINE GLUCONATE 0.12 % MT SOLN
15.0000 mL | Freq: Once | OROMUCOSAL | Status: AC
  Administered 2015-11-21: 15 mL via OROMUCOSAL
  Filled 2015-11-20: qty 15

## 2015-11-20 MED ORDER — METOPROLOL TARTRATE 12.5 MG HALF TABLET: 12.5000 mg | ORAL_TABLET | Freq: Once | ORAL | Status: DC

## 2015-11-20 MED ORDER — SODIUM CHLORIDE 0.9 % IV SOLN
INTRAVENOUS | Status: AC
  Administered 2015-11-21: 1.6 [IU]/h via INTRAVENOUS
  Filled 2015-11-20: qty 2.5

## 2015-11-20 MED ORDER — HEPARIN SODIUM (PORCINE) 1000 UNIT/ML IJ SOLN
INTRAMUSCULAR | Status: DC
  Filled 2015-11-20: qty 30

## 2015-11-20 MED ORDER — GLUTARALDEHYDE 0.625% SOAKING SOLUTION
TOPICAL | Status: DC | PRN
  Filled 2015-11-20: qty 50

## 2015-11-21 ENCOUNTER — Inpatient Hospital Stay (HOSPITAL_COMMUNITY): Payer: Medicare Other | Admitting: Emergency Medicine

## 2015-11-21 ENCOUNTER — Inpatient Hospital Stay (HOSPITAL_COMMUNITY): Payer: Medicare Other | Admitting: Certified Registered Nurse Anesthetist

## 2015-11-21 ENCOUNTER — Inpatient Hospital Stay (HOSPITAL_COMMUNITY): Payer: Medicare Other

## 2015-11-21 ENCOUNTER — Inpatient Hospital Stay (HOSPITAL_COMMUNITY)
Admission: RE | Admit: 2015-11-21 | Discharge: 2015-12-01 | DRG: 219 | Disposition: A | Discharge status: Home Health | Payer: Medicare Other | Source: Ambulatory Visit | Attending: Thoracic Surgery (Cardiothoracic Vascular Surgery) | Admitting: Thoracic Surgery (Cardiothoracic Vascular Surgery)

## 2015-11-21 ENCOUNTER — Encounter (HOSPITAL_COMMUNITY)
Admission: RE | Disposition: A | Discharge status: Home Health | Payer: Self-pay | Source: Ambulatory Visit | Attending: Thoracic Surgery (Cardiothoracic Vascular Surgery)

## 2015-11-21 ENCOUNTER — Encounter (HOSPITAL_COMMUNITY): Payer: Self-pay | Admitting: Thoracic Surgery (Cardiothoracic Vascular Surgery)

## 2015-11-21 DIAGNOSIS — I482 Chronic atrial fibrillation: Secondary | ICD-10-CM | POA: Diagnosis present

## 2015-11-21 DIAGNOSIS — I272 Other secondary pulmonary hypertension: Secondary | ICD-10-CM | POA: Diagnosis present

## 2015-11-21 DIAGNOSIS — Z888 Allergy status to other drugs, medicaments and biological substances status: Secondary | ICD-10-CM | POA: Diagnosis not present

## 2015-11-21 DIAGNOSIS — I5033 Acute on chronic diastolic (congestive) heart failure: Secondary | ICD-10-CM | POA: Diagnosis present

## 2015-11-21 DIAGNOSIS — M353 Polymyalgia rheumatica: Secondary | ICD-10-CM | POA: Diagnosis present

## 2015-11-21 DIAGNOSIS — Z951 Presence of aortocoronary bypass graft: Secondary | ICD-10-CM

## 2015-11-21 DIAGNOSIS — R001 Bradycardia, unspecified: Secondary | ICD-10-CM | POA: Diagnosis not present

## 2015-11-21 DIAGNOSIS — E669 Obesity, unspecified: Secondary | ICD-10-CM | POA: Diagnosis present

## 2015-11-21 DIAGNOSIS — D6862 Lupus anticoagulant syndrome: Secondary | ICD-10-CM | POA: Diagnosis present

## 2015-11-21 DIAGNOSIS — I081 Rheumatic disorders of both mitral and tricuspid valves: Secondary | ICD-10-CM | POA: Diagnosis not present

## 2015-11-21 DIAGNOSIS — Z86711 Personal history of pulmonary embolism: Secondary | ICD-10-CM

## 2015-11-21 DIAGNOSIS — Z952 Presence of prosthetic heart valve: Secondary | ICD-10-CM | POA: Diagnosis not present

## 2015-11-21 DIAGNOSIS — Z7901 Long term (current) use of anticoagulants: Secondary | ICD-10-CM | POA: Diagnosis not present

## 2015-11-21 DIAGNOSIS — J9811 Atelectasis: Secondary | ICD-10-CM | POA: Diagnosis not present

## 2015-11-21 DIAGNOSIS — J439 Emphysema, unspecified: Secondary | ICD-10-CM | POA: Diagnosis not present

## 2015-11-21 DIAGNOSIS — D62 Acute posthemorrhagic anemia: Secondary | ICD-10-CM | POA: Diagnosis not present

## 2015-11-21 DIAGNOSIS — I252 Old myocardial infarction: Secondary | ICD-10-CM

## 2015-11-21 DIAGNOSIS — Z9689 Presence of other specified functional implants: Secondary | ICD-10-CM

## 2015-11-21 DIAGNOSIS — R760 Raised antibody titer: Secondary | ICD-10-CM | POA: Diagnosis present

## 2015-11-21 DIAGNOSIS — I130 Hypertensive heart and chronic kidney disease with heart failure and stage 1 through stage 4 chronic kidney disease, or unspecified chronic kidney disease: Secondary | ICD-10-CM | POA: Diagnosis present

## 2015-11-21 DIAGNOSIS — E7800 Pure hypercholesterolemia, unspecified: Secondary | ICD-10-CM | POA: Diagnosis present

## 2015-11-21 DIAGNOSIS — I480 Paroxysmal atrial fibrillation: Secondary | ICD-10-CM | POA: Diagnosis present

## 2015-11-21 DIAGNOSIS — I509 Heart failure, unspecified: Secondary | ICD-10-CM | POA: Diagnosis not present

## 2015-11-21 DIAGNOSIS — I4892 Unspecified atrial flutter: Secondary | ICD-10-CM | POA: Diagnosis not present

## 2015-11-21 DIAGNOSIS — I2510 Atherosclerotic heart disease of native coronary artery without angina pectoris: Secondary | ICD-10-CM | POA: Diagnosis present

## 2015-11-21 DIAGNOSIS — I481 Persistent atrial fibrillation: Secondary | ICD-10-CM | POA: Diagnosis present

## 2015-11-21 DIAGNOSIS — J90 Pleural effusion, not elsewhere classified: Secondary | ICD-10-CM | POA: Diagnosis not present

## 2015-11-21 DIAGNOSIS — Z4682 Encounter for fitting and adjustment of non-vascular catheter: Secondary | ICD-10-CM | POA: Diagnosis not present

## 2015-11-21 DIAGNOSIS — D6959 Other secondary thrombocytopenia: Secondary | ICD-10-CM | POA: Diagnosis not present

## 2015-11-21 DIAGNOSIS — I110 Hypertensive heart disease with heart failure: Secondary | ICD-10-CM | POA: Diagnosis present

## 2015-11-21 DIAGNOSIS — E785 Hyperlipidemia, unspecified: Secondary | ICD-10-CM | POA: Diagnosis present

## 2015-11-21 DIAGNOSIS — N183 Chronic kidney disease, stage 3 (moderate): Secondary | ICD-10-CM | POA: Diagnosis present

## 2015-11-21 DIAGNOSIS — Z452 Encounter for adjustment and management of vascular access device: Secondary | ICD-10-CM | POA: Diagnosis not present

## 2015-11-21 DIAGNOSIS — I340 Nonrheumatic mitral (valve) insufficiency: Secondary | ICD-10-CM | POA: Diagnosis not present

## 2015-11-21 DIAGNOSIS — M1990 Unspecified osteoarthritis, unspecified site: Secondary | ICD-10-CM | POA: Diagnosis not present

## 2015-11-21 DIAGNOSIS — R0602 Shortness of breath: Secondary | ICD-10-CM | POA: Diagnosis not present

## 2015-11-21 DIAGNOSIS — Z9889 Other specified postprocedural states: Secondary | ICD-10-CM

## 2015-11-21 DIAGNOSIS — I5032 Chronic diastolic (congestive) heart failure: Secondary | ICD-10-CM | POA: Diagnosis present

## 2015-11-21 HISTORY — DX: Other specified postprocedural states: Z98.890

## 2015-11-21 HISTORY — DX: Essential (primary) hypertension: I10

## 2015-11-21 HISTORY — PX: CHEST TUBE INSERTION: SHX231

## 2015-11-21 HISTORY — PX: MITRAL VALVE REPAIR: SHX2039

## 2015-11-21 HISTORY — PX: TEE WITHOUT CARDIOVERSION: SHX5443

## 2015-11-21 LAB — POCT I-STAT, CHEM 8
BUN: 26 mg/dL — ABNORMAL HIGH (ref 6–20)
BUN: 22 mg/dL — ABNORMAL HIGH (ref 6–20)
BUN: 23 mg/dL — ABNORMAL HIGH (ref 6–20)
BUN: 24 mg/dL — AB (ref 6–20)
BUN: 24 mg/dL — AB (ref 6–20)
BUN: 22 mg/dL — AB (ref 6–20)
BUN: 17 mg/dL (ref 6–20)
CALCIUM ION: 1.22 mmol/L (ref 1.12–1.23)
CALCIUM ION: 1.23 mmol/L (ref 1.12–1.23)
CALCIUM ION: 1.1 mmol/L — AB (ref 1.12–1.23)
CHLORIDE: 106 mmol/L (ref 101–111)
CHLORIDE: 103 mmol/L (ref 101–111)
CHLORIDE: 101 mmol/L (ref 101–111)
CREATININE: 1.2 mg/dL (ref 0.61–1.24)
CREATININE: 1.2 mg/dL (ref 0.61–1.24)
CREATININE: 1.1 mg/dL (ref 0.61–1.24)
Calcium, Ion: 1.08 mmol/L — ABNORMAL LOW (ref 1.12–1.23)
Calcium, Ion: 1.14 mmol/L (ref 1.12–1.23)
Calcium, Ion: 1.06 mmol/L — ABNORMAL LOW (ref 1.12–1.23)
Calcium, Ion: 1.09 mmol/L — ABNORMAL LOW (ref 1.12–1.23)
Chloride: 102 mmol/L (ref 101–111)
Chloride: 107 mmol/L (ref 101–111)
Chloride: 102 mmol/L (ref 101–111)
Chloride: 105 mmol/L (ref 101–111)
Creatinine, Ser: 1.3 mg/dL — ABNORMAL HIGH (ref 0.61–1.24)
Creatinine, Ser: 1.1 mg/dL (ref 0.61–1.24)
Creatinine, Ser: 1.1 mg/dL (ref 0.61–1.24)
Creatinine, Ser: 1.2 mg/dL (ref 0.61–1.24)
GLUCOSE: 123 mg/dL — AB (ref 65–99)
GLUCOSE: 140 mg/dL — AB (ref 65–99)
Glucose, Bld: 131 mg/dL — ABNORMAL HIGH (ref 65–99)
Glucose, Bld: 135 mg/dL — ABNORMAL HIGH (ref 65–99)
Glucose, Bld: 148 mg/dL — ABNORMAL HIGH (ref 65–99)
Glucose, Bld: 151 mg/dL — ABNORMAL HIGH (ref 65–99)
Glucose, Bld: 154 mg/dL — ABNORMAL HIGH (ref 65–99)
HCT: 41 % (ref 39.0–52.0)
HCT: 34 % — ABNORMAL LOW (ref 39.0–52.0)
HEMATOCRIT: 33 % — AB (ref 39.0–52.0)
HEMATOCRIT: 41 % (ref 39.0–52.0)
HEMATOCRIT: 28 % — AB (ref 39.0–52.0)
HEMATOCRIT: 22 % — AB (ref 39.0–52.0)
HEMATOCRIT: 26 % — AB (ref 39.0–52.0)
HEMOGLOBIN: 13.9 g/dL (ref 13.0–17.0)
HEMOGLOBIN: 11.2 g/dL — AB (ref 13.0–17.0)
HEMOGLOBIN: 13.9 g/dL (ref 13.0–17.0)
HEMOGLOBIN: 9.5 g/dL — AB (ref 13.0–17.0)
HEMOGLOBIN: 11.6 g/dL — AB (ref 13.0–17.0)
Hemoglobin: 7.5 g/dL — ABNORMAL LOW (ref 13.0–17.0)
Hemoglobin: 8.8 g/dL — ABNORMAL LOW (ref 13.0–17.0)
POTASSIUM: 3.9 mmol/L (ref 3.5–5.1)
POTASSIUM: 3.5 mmol/L (ref 3.5–5.1)
POTASSIUM: 3.5 mmol/L (ref 3.5–5.1)
POTASSIUM: 4 mmol/L (ref 3.5–5.1)
Potassium: 3.9 mmol/L (ref 3.5–5.1)
Potassium: 3.8 mmol/L (ref 3.5–5.1)
Potassium: 3.6 mmol/L (ref 3.5–5.1)
SODIUM: 141 mmol/L (ref 135–145)
SODIUM: 139 mmol/L (ref 135–145)
SODIUM: 140 mmol/L (ref 135–145)
SODIUM: 141 mmol/L (ref 135–145)
SODIUM: 140 mmol/L (ref 135–145)
SODIUM: 142 mmol/L (ref 135–145)
Sodium: 142 mmol/L (ref 135–145)
TCO2: 24 mmol/L (ref 0–100)
TCO2: 22 mmol/L (ref 0–100)
TCO2: 25 mmol/L (ref 0–100)
TCO2: 27 mmol/L (ref 0–100)
TCO2: 25 mmol/L (ref 0–100)
TCO2: 25 mmol/L (ref 0–100)
TCO2: 22 mmol/L (ref 0–100)

## 2015-11-21 LAB — POCT I-STAT 3, ART BLOOD GAS (G3+)
ACID-BASE DEFICIT: 8 mmol/L — AB (ref 0.0–2.0)
ACID-BASE EXCESS: 2 mmol/L (ref 0.0–2.0)
Acid-base deficit: 4 mmol/L — ABNORMAL HIGH (ref 0.0–2.0)
Acid-base deficit: 7 mmol/L — ABNORMAL HIGH (ref 0.0–2.0)
Acid-base deficit: 5 mmol/L — ABNORMAL HIGH (ref 0.0–2.0)
Acid-base deficit: 6 mmol/L — ABNORMAL HIGH (ref 0.0–2.0)
BICARBONATE: 22.6 meq/L (ref 20.0–24.0)
BICARBONATE: 30.2 meq/L — AB (ref 20.0–24.0)
BICARBONATE: 19.6 meq/L — AB (ref 20.0–24.0)
BICARBONATE: 21.2 meq/L (ref 20.0–24.0)
Bicarbonate: 20.1 mEq/L (ref 20.0–24.0)
Bicarbonate: 19 mEq/L — ABNORMAL LOW (ref 20.0–24.0)
O2 SAT: 96 %
O2 Saturation: 100 %
O2 Saturation: 100 %
O2 Saturation: 98 %
O2 Saturation: 99 %
O2 Saturation: 98 %
PCO2 ART: 72 mmHg — AB (ref 35.0–45.0)
PCO2 ART: 49.7 mmHg — AB (ref 35.0–45.0)
PCO2 ART: 36.4 mmHg (ref 35.0–45.0)
PH ART: 7.312 — AB (ref 7.350–7.450)
PH ART: 7.234 — AB (ref 7.350–7.450)
PH ART: 7.347 — AB (ref 7.350–7.450)
PH ART: 7.35 (ref 7.350–7.450)
PO2 ART: 328 mmHg — AB (ref 80.0–100.0)
PO2 ART: 322 mmHg — AB (ref 80.0–100.0)
PO2 ART: 83 mmHg (ref 80.0–100.0)
PO2 ART: 122 mmHg — AB (ref 80.0–100.0)
Patient temperature: 36.4
TCO2: 24 mmol/L (ref 0–100)
TCO2: 32 mmol/L (ref 0–100)
TCO2: 21 mmol/L (ref 0–100)
TCO2: 23 mmol/L (ref 0–100)
TCO2: 21 mmol/L (ref 0–100)
TCO2: 20 mmol/L (ref 0–100)
pCO2 arterial: 44.7 mmHg (ref 35.0–45.0)
pCO2 arterial: 42.4 mmHg (ref 35.0–45.0)
pCO2 arterial: 34.3 mmHg — ABNORMAL LOW (ref 35.0–45.0)
pH, Arterial: 7.23 — ABNORMAL LOW (ref 7.350–7.450)
pH, Arterial: 7.259 — ABNORMAL LOW (ref 7.350–7.450)
pO2, Arterial: 145 mmHg — ABNORMAL HIGH (ref 80.0–100.0)
pO2, Arterial: 107 mmHg — ABNORMAL HIGH (ref 80.0–100.0)

## 2015-11-21 LAB — CREATININE, SERUM
Creatinine, Ser: 1.3 mg/dL — ABNORMAL HIGH (ref 0.61–1.24)
GFR calc Af Amer: >60 mL/min (ref >60)
GFR, EST NON AFRICAN AMERICAN: 55 mL/min — AB (ref >60)

## 2015-11-21 LAB — HEMOGLOBIN AND HEMATOCRIT, BLOOD
HEMATOCRIT: 23 % — AB (ref 39.0–52.0)
HEMOGLOBIN: 7.5 g/dL — AB (ref 13.0–17.0)

## 2015-11-21 LAB — POCT I-STAT 4, (NA,K, GLUC, HGB,HCT)
GLUCOSE: 122 mg/dL — AB (ref 65–99)
HEMATOCRIT: 38 % — AB (ref 39.0–52.0)
Hemoglobin: 12.9 g/dL — ABNORMAL LOW (ref 13.0–17.0)
POTASSIUM: 3 mmol/L — AB (ref 3.5–5.1)
Sodium: 144 mmol/L (ref 135–145)

## 2015-11-21 LAB — GLUCOSE, CAPILLARY
GLUCOSE-CAPILLARY: 132 mg/dL — AB (ref 65–99)
GLUCOSE-CAPILLARY: 131 mg/dL — AB (ref 65–99)
Glucose-Capillary: 120 mg/dL — ABNORMAL HIGH (ref 65–99)
Glucose-Capillary: 125 mg/dL — ABNORMAL HIGH (ref 65–99)
Glucose-Capillary: 144 mg/dL — ABNORMAL HIGH (ref 65–99)

## 2015-11-21 LAB — APTT: APTT: 44 s — AB (ref 24–37)

## 2015-11-21 LAB — CBC
HEMATOCRIT: 37.2 % — AB (ref 39.0–52.0)
HEMATOCRIT: 35.9 % — AB (ref 39.0–52.0)
HEMOGLOBIN: 12.3 g/dL — AB (ref 13.0–17.0)
Hemoglobin: 11.5 g/dL — ABNORMAL LOW (ref 13.0–17.0)
MCH: 31.2 pg (ref 26.0–34.0)
MCH: 30.2 pg (ref 26.0–34.0)
MCHC: 33.1 g/dL (ref 30.0–36.0)
MCHC: 32 g/dL (ref 30.0–36.0)
MCV: 94.4 fL (ref 78.0–100.0)
MCV: 94.2 fL (ref 78.0–100.0)
Platelets: 96 10*3/uL — ABNORMAL LOW (ref 150–400)
Platelets: 93 10*3/uL — ABNORMAL LOW (ref 150–400)
RBC: 3.94 MIL/uL — ABNORMAL LOW (ref 4.22–5.81)
RBC: 3.81 MIL/uL — AB (ref 4.22–5.81)
RDW: 14.8 % (ref 11.5–15.5)
RDW: 14.7 % (ref 11.5–15.5)
WBC: 23.2 10*3/uL — AB (ref 4.0–10.5)
WBC: 19.5 10*3/uL — AB (ref 4.0–10.5)

## 2015-11-21 LAB — MAGNESIUM: Magnesium: 2.3 mg/dL (ref 1.7–2.4)

## 2015-11-21 LAB — PLATELET COUNT: Platelets: 76 10*3/uL — ABNORMAL LOW (ref 150–400)

## 2015-11-21 LAB — PROTIME-INR
INR: 2.05 — AB (ref 0.00–1.49)
Prothrombin Time: 23 seconds — ABNORMAL HIGH (ref 11.6–15.2)

## 2015-11-21 SURGERY — REPAIR, MITRAL VALVE, MINIMALLY INVASIVE
Anesthesia: General | Site: Chest | Laterality: Right

## 2015-11-21 MED ORDER — MIDAZOLAM HCL 2 MG/2ML IJ SOLN: 2.0000 mg | INTRAMUSCULAR | Status: DC | PRN

## 2015-11-21 MED ORDER — ALBUMIN HUMAN 5 % IV SOLN
250.0000 mL | INTRAVENOUS | Status: AC | PRN
  Administered 2015-11-21 – 2015-11-22 (×3): 250 mL via INTRAVENOUS
  Filled 2015-11-21 (×2): qty 250

## 2015-11-21 MED ORDER — LACTATED RINGERS IV SOLN: INTRAVENOUS | Status: DC

## 2015-11-21 MED ORDER — LACTATED RINGERS IV SOLN
INTRAVENOUS | Status: DC | PRN
  Administered 2015-11-21: 07:00:00 via INTRAVENOUS

## 2015-11-21 MED ORDER — ASPIRIN 81 MG PO CHEW: 324.0000 mg | CHEWABLE_TABLET | Freq: Every day | ORAL | Status: DC

## 2015-11-21 MED ORDER — ROCURONIUM BROMIDE 50 MG/5ML IV SOLN
INTRAVENOUS | Status: AC
  Filled 2015-11-21: qty 2

## 2015-11-21 MED ORDER — AMIODARONE LOAD VIA INFUSION
150.0000 mg | Freq: Once | INTRAVENOUS | Status: AC
  Administered 2015-11-21: 150 mg via INTRAVENOUS

## 2015-11-21 MED ORDER — PROTAMINE SULFATE 10 MG/ML IV SOLN
INTRAVENOUS | Status: AC
  Filled 2015-11-21: qty 5

## 2015-11-21 MED ORDER — METOPROLOL TARTRATE 25 MG/10 ML ORAL SUSPENSION
12.5000 mg | Freq: Two times a day (BID) | ORAL | Status: DC
Start: 2015-11-21 — End: 2015-11-22

## 2015-11-21 MED ORDER — PROTAMINE SULFATE 10 MG/ML IV SOLN
INTRAVENOUS | Status: DC | PRN
  Administered 2015-11-21: 270 mg via INTRAVENOUS
  Administered 2015-11-21: 10 mg via INTRAVENOUS

## 2015-11-21 MED ORDER — BISACODYL 5 MG PO TBEC
10.0000 mg | DELAYED_RELEASE_TABLET | Freq: Every day | ORAL | Status: DC
  Administered 2015-11-22 – 2015-11-28 (×4): 10 mg via ORAL
  Filled 2015-11-21 (×9): qty 2

## 2015-11-21 MED ORDER — LACTATED RINGERS IV SOLN
500.0000 mL | Freq: Once | INTRAVENOUS | Status: AC | PRN
  Administered 2015-11-21: 500 mL via INTRAVENOUS

## 2015-11-21 MED ORDER — PROPOFOL 10 MG/ML IV BOLUS
INTRAVENOUS | Status: DC | PRN
  Administered 2015-11-21: 130 mg via INTRAVENOUS

## 2015-11-21 MED ORDER — LIDOCAINE 2% (20 MG/ML) 5 ML SYRINGE
INTRAMUSCULAR | Status: AC
  Filled 2015-11-21: qty 5

## 2015-11-21 MED ORDER — VANCOMYCIN HCL IN DEXTROSE 1-5 GM/200ML-% IV SOLN
1000.0000 mg | Freq: Once | INTRAVENOUS | Status: AC
  Administered 2015-11-21: 1000 mg via INTRAVENOUS
  Filled 2015-11-21: qty 200

## 2015-11-21 MED ORDER — ASPIRIN EC 325 MG PO TBEC
325.0000 mg | DELAYED_RELEASE_TABLET | Freq: Every day | ORAL | Status: DC
  Administered 2015-11-22 – 2015-11-23 (×2): 325 mg via ORAL
  Filled 2015-11-21 (×2): qty 1

## 2015-11-21 MED ORDER — SODIUM CHLORIDE 0.9 % IV SOLN: INTRAVENOUS | Status: DC

## 2015-11-21 MED ORDER — POTASSIUM CHLORIDE 10 MEQ/50ML IV SOLN
10.0000 meq | INTRAVENOUS | Status: AC
  Administered 2015-11-21 (×4): 10 meq via INTRAVENOUS

## 2015-11-21 MED ORDER — SODIUM CHLORIDE 0.9 % IV SOLN
INTRAVENOUS | Status: DC
  Administered 2015-11-21: 15:00:00 via INTRAVENOUS

## 2015-11-21 MED ORDER — HEMOSTATIC AGENTS (NO CHARGE) OPTIME
TOPICAL | Status: DC | PRN
  Administered 2015-11-21: 1 via TOPICAL

## 2015-11-21 MED ORDER — ROCURONIUM BROMIDE 50 MG/5ML IV SOLN
INTRAVENOUS | Status: AC
  Filled 2015-11-21: qty 1

## 2015-11-21 MED ORDER — SUCCINYLCHOLINE CHLORIDE 20 MG/ML IJ SOLN
INTRAMUSCULAR | Status: DC | PRN
  Administered 2015-11-21: 160 mg via INTRAVENOUS

## 2015-11-21 MED ORDER — CHLORHEXIDINE GLUCONATE 0.12% ORAL RINSE (MEDLINE KIT)
15.0000 mL | Freq: Two times a day (BID) | OROMUCOSAL | Status: DC
  Administered 2015-11-21: 15 mL via OROMUCOSAL

## 2015-11-21 MED ORDER — DOCUSATE SODIUM 100 MG PO CAPS
200.0000 mg | ORAL_CAPSULE | Freq: Every day | ORAL | Status: DC
  Administered 2015-11-22 – 2015-11-30 (×9): 200 mg via ORAL
  Filled 2015-11-21 (×10): qty 2

## 2015-11-21 MED ORDER — BISACODYL 10 MG RE SUPP
10.0000 mg | Freq: Every day | RECTAL | Status: DC
  Filled 2015-11-21: qty 1

## 2015-11-21 MED ORDER — FENTANYL CITRATE (PF) 100 MCG/2ML IJ SOLN
INTRAMUSCULAR | Status: DC | PRN
  Administered 2015-11-21: 50 ug via INTRAVENOUS
  Administered 2015-11-21: 100 ug via INTRAVENOUS
  Administered 2015-11-21 (×2): 50 ug via INTRAVENOUS

## 2015-11-21 MED ORDER — METOPROLOL TARTRATE 5 MG/5ML IV SOLN: 2.5000 mg | INTRAVENOUS | Status: DC | PRN

## 2015-11-21 MED ORDER — HEPARIN SODIUM (PORCINE) 1000 UNIT/ML IJ SOLN
INTRAMUSCULAR | Status: AC
  Filled 2015-11-21: qty 1

## 2015-11-21 MED ORDER — POTASSIUM CHLORIDE 10 MEQ/50ML IV SOLN
10.0000 meq | INTRAVENOUS | Status: AC
  Administered 2015-11-21: 10 meq via INTRAVENOUS

## 2015-11-21 MED ORDER — CHLORHEXIDINE GLUCONATE 4 % EX LIQD: 30.0000 mL | CUTANEOUS | Status: DC

## 2015-11-21 MED ORDER — PROPOFOL 10 MG/ML IV BOLUS
INTRAVENOUS | Status: AC
  Filled 2015-11-21: qty 20

## 2015-11-21 MED ORDER — INSULIN REGULAR BOLUS VIA INFUSION
0.0000 [IU] | Freq: Three times a day (TID) | INTRAVENOUS | Status: DC
  Filled 2015-11-21: qty 10

## 2015-11-21 MED ORDER — PROTAMINE SULFATE 10 MG/ML IV SOLN
INTRAVENOUS | Status: AC
  Filled 2015-11-21: qty 25

## 2015-11-21 MED ORDER — INSULIN REGULAR HUMAN 100 UNIT/ML IJ SOLN
INTRAMUSCULAR | Status: DC
  Administered 2015-11-21: 19:00:00 via INTRAVENOUS
  Filled 2015-11-21 (×2): qty 2.5

## 2015-11-21 MED ORDER — DEXTROSE 5 % IV SOLN
1.5000 g | Freq: Two times a day (BID) | INTRAVENOUS | Status: AC
  Administered 2015-11-21 – 2015-11-23 (×4): 1.5 g via INTRAVENOUS
  Filled 2015-11-21 (×4): qty 1.5

## 2015-11-21 MED ORDER — MILRINONE LACTATE IN DEXTROSE 20-5 MG/100ML-% IV SOLN
0.1250 ug/kg/min | INTRAVENOUS | Status: DC
  Administered 2015-11-21: .3 ug/kg/min via INTRAVENOUS
  Filled 2015-11-21: qty 100

## 2015-11-21 MED ORDER — ARTIFICIAL TEARS OP OINT
TOPICAL_OINTMENT | OPHTHALMIC | Status: DC | PRN
  Administered 2015-11-21: 1 via OPHTHALMIC

## 2015-11-21 MED ORDER — ONDANSETRON HCL 4 MG/2ML IJ SOLN
INTRAMUSCULAR | Status: AC
  Filled 2015-11-21: qty 2

## 2015-11-21 MED ORDER — ACETAMINOPHEN 160 MG/5ML PO SOLN: 650.0000 mg | Freq: Once | ORAL | Status: AC

## 2015-11-21 MED ORDER — TRAMADOL HCL 50 MG PO TABS: 50.0000 mg | ORAL_TABLET | ORAL | Status: DC | PRN

## 2015-11-21 MED ORDER — EPINEPHRINE HCL 1 MG/ML IJ SOLN
0.0000 ug/min | INTRAMUSCULAR | Status: DC
  Filled 2015-11-21: qty 4

## 2015-11-21 MED ORDER — 0.9 % SODIUM CHLORIDE (POUR BTL) OPTIME
TOPICAL | Status: DC | PRN
  Administered 2015-11-21: 6000 mL

## 2015-11-21 MED ORDER — HEPARIN SODIUM (PORCINE) 1000 UNIT/ML IJ SOLN
INTRAMUSCULAR | Status: DC | PRN
  Administered 2015-11-21: 29000 [IU] via INTRAVENOUS

## 2015-11-21 MED ORDER — METOPROLOL TARTRATE 12.5 MG HALF TABLET: 12.5000 mg | ORAL_TABLET | Freq: Two times a day (BID) | ORAL | Status: DC

## 2015-11-21 MED ORDER — MIDAZOLAM HCL 5 MG/5ML IJ SOLN
INTRAMUSCULAR | Status: DC | PRN
  Administered 2015-11-21 (×5): 2 mg via INTRAVENOUS

## 2015-11-21 MED ORDER — ALBUMIN HUMAN 5 % IV SOLN
INTRAVENOUS | Status: DC | PRN
  Administered 2015-11-21: 15:00:00 via INTRAVENOUS

## 2015-11-21 MED ORDER — ONDANSETRON HCL 4 MG/2ML IJ SOLN: 4.0000 mg | Freq: Four times a day (QID) | INTRAMUSCULAR | Status: DC | PRN

## 2015-11-21 MED ORDER — ACETAMINOPHEN 500 MG PO TABS
1000.0000 mg | ORAL_TABLET | Freq: Four times a day (QID) | ORAL | Status: DC
  Administered 2015-11-21 – 2015-11-23 (×7): 1000 mg via ORAL
  Filled 2015-11-21 (×8): qty 2

## 2015-11-21 MED ORDER — DEXMEDETOMIDINE HCL IN NACL 200 MCG/50ML IV SOLN
0.0000 ug/kg/h | INTRAVENOUS | Status: DC
  Filled 2015-11-21 (×2): qty 50

## 2015-11-21 MED ORDER — SODIUM CHLORIDE 0.9% FLUSH
3.0000 mL | INTRAVENOUS | Status: DC | PRN
  Administered 2015-11-23: 3 mL via INTRAVENOUS
  Filled 2015-11-21: qty 3

## 2015-11-21 MED ORDER — SODIUM CHLORIDE 0.9% FLUSH
3.0000 mL | Freq: Two times a day (BID) | INTRAVENOUS | Status: DC
  Administered 2015-11-22 – 2015-11-23 (×2): 3 mL via INTRAVENOUS

## 2015-11-21 MED ORDER — PANTOPRAZOLE SODIUM 40 MG PO TBEC
40.0000 mg | DELAYED_RELEASE_TABLET | Freq: Every day | ORAL | Status: DC
  Administered 2015-11-23 – 2015-12-01 (×9): 40 mg via ORAL
  Filled 2015-11-21 (×9): qty 1

## 2015-11-21 MED ORDER — FAMOTIDINE IN NACL 20-0.9 MG/50ML-% IV SOLN
20.0000 mg | Freq: Two times a day (BID) | INTRAVENOUS | Status: AC
  Administered 2015-11-21 (×2): 20 mg via INTRAVENOUS
  Filled 2015-11-21: qty 50

## 2015-11-21 MED ORDER — PHENYLEPHRINE HCL 10 MG/ML IJ SOLN
0.0000 ug/min | INTRAVENOUS | Status: DC
  Administered 2015-11-21 – 2015-11-22 (×2): 50 ug/min via INTRAVENOUS
  Administered 2015-11-22: 25 ug/min via INTRAVENOUS
  Filled 2015-11-21 (×4): qty 2

## 2015-11-21 MED ORDER — CHLORHEXIDINE GLUCONATE 0.12 % MT SOLN
15.0000 mL | OROMUCOSAL | Status: AC
  Administered 2015-11-21: 15 mL via OROMUCOSAL

## 2015-11-21 MED ORDER — MIDAZOLAM HCL 10 MG/2ML IJ SOLN
INTRAMUSCULAR | Status: AC
  Filled 2015-11-21: qty 2

## 2015-11-21 MED ORDER — ONDANSETRON HCL 4 MG/2ML IJ SOLN
INTRAMUSCULAR | Status: DC | PRN
  Administered 2015-11-21: 4 mg via INTRAVENOUS

## 2015-11-21 MED ORDER — ARTIFICIAL TEARS OP OINT
TOPICAL_OINTMENT | OPHTHALMIC | Status: AC
  Filled 2015-11-21: qty 3.5

## 2015-11-21 MED ORDER — MAGNESIUM SULFATE 4 GM/100ML IV SOLN
4.0000 g | Freq: Once | INTRAVENOUS | Status: AC
  Administered 2015-11-21: 4 g via INTRAVENOUS
  Filled 2015-11-21: qty 100

## 2015-11-21 MED ORDER — SODIUM CHLORIDE 0.45 % IV SOLN
INTRAVENOUS | Status: DC | PRN
  Administered 2015-11-21: 15:00:00 via INTRAVENOUS

## 2015-11-21 MED ORDER — LACTATED RINGERS IV SOLN
INTRAVENOUS | Status: DC | PRN
  Administered 2015-11-21: 08:00:00 via INTRAVENOUS

## 2015-11-21 MED ORDER — AMIODARONE HCL IN DEXTROSE 360-4.14 MG/200ML-% IV SOLN
30.0000 mg/h | INTRAVENOUS | Status: DC
  Administered 2015-11-22 – 2015-11-24 (×5): 30 mg/h via INTRAVENOUS
  Filled 2015-11-21 (×6): qty 200

## 2015-11-21 MED ORDER — MORPHINE SULFATE (PF) 2 MG/ML IV SOLN: 1.0000 mg | INTRAVENOUS | Status: DC | PRN

## 2015-11-21 MED ORDER — NITROGLYCERIN IN D5W 200-5 MCG/ML-% IV SOLN: 0.0000 ug/min | INTRAVENOUS | Status: DC

## 2015-11-21 MED ORDER — FENTANYL CITRATE (PF) 250 MCG/5ML IJ SOLN
INTRAMUSCULAR | Status: AC
  Filled 2015-11-21: qty 5

## 2015-11-21 MED ORDER — ROCURONIUM BROMIDE 10 MG/ML (PF) SYRINGE
PREFILLED_SYRINGE | INTRAVENOUS | Status: DC | PRN
  Administered 2015-11-21 (×4): 50 mg via INTRAVENOUS
  Administered 2015-11-21: 20 mg via INTRAVENOUS

## 2015-11-21 MED ORDER — SODIUM BICARBONATE 8.4 % IV SOLN
100.0000 meq | Freq: Once | INTRAVENOUS | Status: AC
  Administered 2015-11-21: 100 meq via INTRAVENOUS

## 2015-11-21 MED ORDER — OXYCODONE HCL 5 MG PO TABS
5.0000 mg | ORAL_TABLET | ORAL | Status: DC | PRN
  Administered 2015-11-24: 10 mg via ORAL
  Administered 2015-11-25 – 2015-11-26 (×2): 5 mg via ORAL
  Administered 2015-11-26: 10 mg via ORAL
  Administered 2015-11-26 – 2015-11-27 (×2): 5 mg via ORAL
  Administered 2015-11-27 (×2): 10 mg via ORAL
  Administered 2015-11-27 – 2015-11-29 (×4): 5 mg via ORAL
  Administered 2015-11-29: 10 mg via ORAL
  Administered 2015-11-29 – 2015-11-30 (×4): 5 mg via ORAL
  Administered 2015-11-30: 10 mg via ORAL
  Administered 2015-11-30: 5 mg via ORAL
  Filled 2015-11-21: qty 2
  Filled 2015-11-21: qty 1
  Filled 2015-11-21 (×2): qty 2
  Filled 2015-11-21 (×4): qty 1
  Filled 2015-11-21: qty 2
  Filled 2015-11-21: qty 1
  Filled 2015-11-21 (×2): qty 2
  Filled 2015-11-21: qty 1
  Filled 2015-11-21: qty 2
  Filled 2015-11-21 (×2): qty 1
  Filled 2015-11-21: qty 2
  Filled 2015-11-21 (×2): qty 1

## 2015-11-21 MED ORDER — ACETAMINOPHEN 160 MG/5ML PO SOLN
1000.0000 mg | Freq: Four times a day (QID) | ORAL | Status: DC
  Administered 2015-11-22: 1000 mg
  Filled 2015-11-21: qty 40.6

## 2015-11-21 MED ORDER — SODIUM CHLORIDE 0.9 % IV SOLN
0.5000 g/h | Freq: Once | INTRAVENOUS | Status: DC
  Filled 2015-11-21: qty 20

## 2015-11-21 MED ORDER — SODIUM CHLORIDE 0.9 % IV SOLN: 250.0000 mL | INTRAVENOUS | Status: DC

## 2015-11-21 MED ORDER — AMIODARONE HCL IN DEXTROSE 360-4.14 MG/200ML-% IV SOLN
60.0000 mg/h | INTRAVENOUS | Status: AC
  Administered 2015-11-21 (×2): 60 mg/h via INTRAVENOUS
  Filled 2015-11-21: qty 200

## 2015-11-21 MED ORDER — LACTATED RINGERS IV SOLN
INTRAVENOUS | Status: DC
Start: 2015-11-21 — End: 2015-11-22

## 2015-11-21 MED ORDER — MILRINONE LACTATE IN DEXTROSE 20-5 MG/100ML-% IV SOLN
0.2000 ug/kg/min | INTRAVENOUS | Status: DC
  Administered 2015-11-21 – 2015-11-22 (×2): 0.3 ug/kg/min via INTRAVENOUS
  Administered 2015-11-23: 0.2 ug/kg/min via INTRAVENOUS
  Filled 2015-11-21 (×3): qty 100

## 2015-11-21 MED ORDER — EPHEDRINE SULFATE 50 MG/ML IJ SOLN
INTRAMUSCULAR | Status: DC | PRN
  Administered 2015-11-21 (×4): 10 mg via INTRAVENOUS

## 2015-11-21 MED ORDER — MORPHINE SULFATE (PF) 2 MG/ML IV SOLN: 2.0000 mg | INTRAVENOUS | Status: DC | PRN

## 2015-11-21 MED ORDER — ANTISEPTIC ORAL RINSE SOLUTION (CORINZ)
7.0000 mL | Freq: Four times a day (QID) | OROMUCOSAL | Status: DC
  Administered 2015-11-21 – 2015-11-22 (×2): 7 mL via OROMUCOSAL

## 2015-11-21 MED ORDER — SODIUM CHLORIDE 0.9 % IV SOLN
INTRAVENOUS | Status: DC | PRN
  Administered 2015-11-21: 14:00:00 via INTRAVENOUS

## 2015-11-21 MED ORDER — ACETAMINOPHEN 650 MG RE SUPP
650.0000 mg | Freq: Once | RECTAL | Status: AC
  Administered 2015-11-21: 650 mg via RECTAL

## 2015-11-21 MED FILL — Potassium Chloride Inj 2 mEq/ML: INTRAVENOUS | Qty: 40 | Status: AC

## 2015-11-21 MED FILL — Magnesium Sulfate Inj 50%: INTRAMUSCULAR | Qty: 10 | Status: AC

## 2015-11-21 MED FILL — Heparin Sodium (Porcine) Inj 1000 Unit/ML: INTRAMUSCULAR | Qty: 30 | Status: AC

## 2015-11-21 SURGICAL SUPPLY — 122 items
ADAPTER CARDIO PERF ANTE/RETRO (ADAPTER) ×4 IMPLANT
BAG DECANTER FOR FLEXI CONT (MISCELLANEOUS) ×8 IMPLANT
BLADE SURG 11 STRL SS (BLADE) ×4 IMPLANT
CANISTER SUCTION 2500CC (MISCELLANEOUS) ×8 IMPLANT
CANNULA FEM VENOUS REMOTE 22FR (CANNULA) ×4 IMPLANT
CANNULA FEMORAL ART 14 SM (MISCELLANEOUS) ×4 IMPLANT
CANNULA GUNDRY RCSP 15FR (MISCELLANEOUS) ×4 IMPLANT
CANNULA OPTISITE PERFUSION 16F (CANNULA) IMPLANT
CANNULA OPTISITE PERFUSION 18F (CANNULA) ×4 IMPLANT
CANNULA SUMP PERICARDIAL (CANNULA) ×8 IMPLANT
CATH KIT ON Q 5IN SLV (PAIN MANAGEMENT) IMPLANT
CATH THORACIC 28FR (CATHETERS) ×4 IMPLANT
CONN ST 1/4X3/8  BEN (MISCELLANEOUS) ×2
CONN ST 1/4X3/8 BEN (MISCELLANEOUS) ×6 IMPLANT
CONNECTOR 1/2X3/8X1/2 3 WAY (MISCELLANEOUS) ×1
CONNECTOR 1/2X3/8X1/2 3WAY (MISCELLANEOUS) ×3 IMPLANT
CONT SPEC STER OR (MISCELLANEOUS) ×4 IMPLANT
COVER BACK TABLE 24X17X13 BIG (DRAPES) ×4 IMPLANT
COVER PROBE W GEL 5X96 (DRAPES) ×4 IMPLANT
CRADLE DONUT ADULT HEAD (MISCELLANEOUS) ×4 IMPLANT
DERMABOND ADVANCED (GAUZE/BANDAGES/DRESSINGS) ×2
DERMABOND ADVANCED .7 DNX12 (GAUZE/BANDAGES/DRESSINGS) ×6 IMPLANT
DEVICE PMI PUNCTURE CLOSURE (MISCELLANEOUS) ×4 IMPLANT
DEVICE SUT CK QUICK LOAD MINI (Prosthesis & Implant Heart) ×4 IMPLANT
DEVICE TROCAR PUNCTURE CLOSURE (ENDOMECHANICALS) ×4 IMPLANT
DRAIN CHANNEL 28F RND 3/8 FF (WOUND CARE) ×8 IMPLANT
DRAPE BILATERAL SPLIT (DRAPES) ×4 IMPLANT
DRAPE C-ARM 42X72 X-RAY (DRAPES) ×4 IMPLANT
DRAPE CV SPLIT W-CLR ANES SCRN (DRAPES) ×4 IMPLANT
DRAPE INCISE IOBAN 66X45 STRL (DRAPES) ×20 IMPLANT
DRAPE SLUSH/WARMER DISC (DRAPES) ×4 IMPLANT
DRSG COVADERM 4X6 (GAUZE/BANDAGES/DRESSINGS) ×4 IMPLANT
DRSG COVADERM 4X8 (GAUZE/BANDAGES/DRESSINGS) ×4 IMPLANT
ELECT BLADE 6.5 EXT (BLADE) ×4 IMPLANT
ELECT REM PT RETURN 9FT ADLT (ELECTROSURGICAL) ×8
ELECTRODE REM PT RTRN 9FT ADLT (ELECTROSURGICAL) ×6 IMPLANT
FELT TEFLON 1X6 (MISCELLANEOUS) ×4 IMPLANT
FEMORAL VENOUS CANN RAP (CANNULA) IMPLANT
GAUZE SPONGE 4X4 12PLY STRL (GAUZE/BANDAGES/DRESSINGS) ×4 IMPLANT
GLOVE BIO SURGEON STRL SZ 6 (GLOVE) ×8 IMPLANT
GLOVE BIO SURGEON STRL SZ 6.5 (GLOVE) ×8 IMPLANT
GLOVE BIO SURGEON STRL SZ7.5 (GLOVE) ×8 IMPLANT
GLOVE BIOGEL PI IND STRL 6 (GLOVE) ×6 IMPLANT
GLOVE BIOGEL PI IND STRL 6.5 (GLOVE) ×9 IMPLANT
GLOVE BIOGEL PI IND STRL 9 (GLOVE) ×3 IMPLANT
GLOVE BIOGEL PI INDICATOR 6 (GLOVE) ×2
GLOVE BIOGEL PI INDICATOR 6.5 (GLOVE) ×3
GLOVE BIOGEL PI INDICATOR 9 (GLOVE) ×1
GLOVE ORTHO TXT STRL SZ7.5 (GLOVE) ×16 IMPLANT
GOWN STRL REUS W/ TWL LRG LVL3 (GOWN DISPOSABLE) ×12 IMPLANT
GOWN STRL REUS W/TWL LRG LVL3 (GOWN DISPOSABLE) ×4
HEMOSTAT SURGICEL 2X14 (HEMOSTASIS) ×4 IMPLANT
KIT BASIN OR (CUSTOM PROCEDURE TRAY) ×4 IMPLANT
KIT DEVICE SUT COR-KNOT MIS 5 (INSTRUMENTS) IMPLANT
KIT DILATOR VASC 18G NDL (KITS) ×4 IMPLANT
KIT DRAINAGE VACCUM ASSIST (KITS) ×4 IMPLANT
KIT ROOM TURNOVER OR (KITS) ×4 IMPLANT
KIT SUCTION CATH 14FR (SUCTIONS) ×4 IMPLANT
KIT SUT CK MINI COMBO 4X17 (Prosthesis & Implant Heart) ×4 IMPLANT
LEAD PACING MYOCARDI (MISCELLANEOUS) ×8 IMPLANT
LINE EXTENSION DELIVERY (MISCELLANEOUS) ×4 IMPLANT
LINE VENT (MISCELLANEOUS) ×4 IMPLANT
NEEDLE AORTIC ROOT 14G 7F (CATHETERS) ×4 IMPLANT
NS IRRIG 1000ML POUR BTL (IV SOLUTION) ×28 IMPLANT
PACK OPEN HEART (CUSTOM PROCEDURE TRAY) ×4 IMPLANT
PAD ARMBOARD 7.5X6 YLW CONV (MISCELLANEOUS) ×8 IMPLANT
PAD ELECT DEFIB RADIOL ZOLL (MISCELLANEOUS) ×4 IMPLANT
PATCH CORMATRIX 4CMX7CM (Prosthesis & Implant Heart) ×4 IMPLANT
RETRACTOR TRL SOFT TISSUE LG (INSTRUMENTS) IMPLANT
RETRACTOR TRM SOFT TISSUE 7.5 (INSTRUMENTS) IMPLANT
RING MITRAL MEMO 3D 32MM SMD32 (Prosthesis & Implant Heart) ×4 IMPLANT
SET CANNULATION TOURNIQUET (MISCELLANEOUS) ×4 IMPLANT
SET CARDIOPLEGIA MPS 5001102 (MISCELLANEOUS) ×4 IMPLANT
SET IRRIG TUBING LAPAROSCOPIC (IRRIGATION / IRRIGATOR) ×4 IMPLANT
SOLUTION ANTI FOG 6CC (MISCELLANEOUS) ×4 IMPLANT
SPONGE GAUZE 4X4 12PLY STER LF (GAUZE/BANDAGES/DRESSINGS) ×4 IMPLANT
SUT BONE WAX W31G (SUTURE) ×4 IMPLANT
SUT E-PACK MINIMALLY INVASIVE (SUTURE) IMPLANT
SUT ETHIBOND 2 0 SH (SUTURE) ×4 IMPLANT
SUT ETHIBOND 2 0 V4 (SUTURE) ×8 IMPLANT
SUT ETHIBOND 2 0V4 GREEN (SUTURE) ×8 IMPLANT
SUT ETHIBOND NAB MH 2-0 36IN (SUTURE) ×4 IMPLANT
SUT ETHIBOND X763 2 0 SH 1 (SUTURE) ×4 IMPLANT
SUT GORETEX CV 4 TH 22 36 (SUTURE) ×4 IMPLANT
SUT GORETEX CV4 TH-18 (SUTURE) ×8 IMPLANT
SUT MNCRL AB 3-0 PS2 18 (SUTURE) ×8 IMPLANT
SUT PROLENE 3 0 SH 1 (SUTURE) ×16 IMPLANT
SUT PROLENE 3 0 SH DA (SUTURE) ×24 IMPLANT
SUT PROLENE 3 0 SH1 36 (SUTURE) ×16 IMPLANT
SUT PROLENE 4 0 RB 1 (SUTURE) ×5
SUT PROLENE 4-0 RB1 .5 CRCL 36 (SUTURE) ×15 IMPLANT
SUT PROLENE 5 0 C 1 36 (SUTURE) ×12 IMPLANT
SUT PROLENE 6 0 C 1 30 (SUTURE) ×12 IMPLANT
SUT PTFE CHORD X 20MM (SUTURE) ×8 IMPLANT
SUT PTFE CHORD X SYSTEM (SUTURE) ×4 IMPLANT
SUT SILK  1 MH (SUTURE) ×7
SUT SILK 1 MH (SUTURE) ×21 IMPLANT
SUT SILK 1 TIES 10X30 (SUTURE) ×4 IMPLANT
SUT SILK 2 0 SH CR/8 (SUTURE) ×4 IMPLANT
SUT SILK 2 0 TIES 10X30 (SUTURE) ×4 IMPLANT
SUT SILK 2 0SH CR/8 30 (SUTURE) ×8 IMPLANT
SUT SILK 3 0 (SUTURE) ×1
SUT SILK 3 0 SH CR/8 (SUTURE) ×4 IMPLANT
SUT SILK 3 0SH CR/8 30 (SUTURE) ×4 IMPLANT
SUT SILK 3-0 18XBRD TIE 12 (SUTURE) ×3 IMPLANT
SUT TEM PAC WIRE 2 0 SH (SUTURE) ×8 IMPLANT
SUT VIC AB 2-0 CTX 36 (SUTURE) ×8 IMPLANT
SUT VIC AB 2-0 UR6 27 (SUTURE) ×8 IMPLANT
SUT VIC AB 3-0 SH 8-18 (SUTURE) ×16 IMPLANT
SUT VICRYL 2 TP 1 (SUTURE) ×4 IMPLANT
SYRINGE 10CC LL (SYRINGE) ×8 IMPLANT
SYSTEM SAHARA CHEST DRAIN ATS (WOUND CARE) ×4 IMPLANT
TOWEL OR 17X24 6PK STRL BLUE (TOWEL DISPOSABLE) ×8 IMPLANT
TOWEL OR 17X26 10 PK STRL BLUE (TOWEL DISPOSABLE) ×8 IMPLANT
TRAY FOLEY IC TEMP SENS 16FR (CATHETERS) ×4 IMPLANT
TROCAR XCEL BLADELESS 5X75MML (TROCAR) ×4 IMPLANT
TROCAR XCEL NON-BLD 11X100MML (ENDOMECHANICALS) ×8 IMPLANT
TUBE SUCT INTRACARD DLP 20F (MISCELLANEOUS) ×4 IMPLANT
TUNNELER SHEATH ON-Q 11GX8 DSP (PAIN MANAGEMENT) IMPLANT
UNDERPAD 30X30 INCONTINENT (UNDERPADS AND DIAPERS) ×4 IMPLANT
WATER STERILE IRR 1000ML POUR (IV SOLUTION) ×8 IMPLANT
WIRE J 3MM .035X145CM (WIRE) ×4 IMPLANT

## 2015-11-21 NOTE — Procedures (Signed)
Extubation Procedure Note  Patient Details:   Name: Peter Bruce DOB: May 25, 1950 MRN: [id number]   Airway Documentation:     Evaluation  O2 sats: stable throughout Complications: No apparent complications Patient did tolerate procedure well. Bilateral Breath Sounds: Coarse crackles, Diminished   Yes  Jori Moll 11/21/2015, 10:00 PM   Patient extubated per rapid wean protocol, performed a NIF of -24, FVC of .8L, cuff leak was present, and patient was extubated to 4L nasal cannula.

## 2015-11-21 NOTE — Interval H&P Note (Signed)
History and Physical Interval Note:  11/21/2015 7:29 AM  Peter Bruce  has presented today for surgery, with the diagnosis of MR  The various methods of treatment have been discussed with the patient and family. After consideration of risks, benefits and other options for treatment, the patient has consented to  Procedure(s): MINIMALLY INVASIVE MITRAL VALVE REPAIR OR REPLACEMENT (MVR) (Right) TRANSESOPHAGEAL ECHOCARDIOGRAM (TEE) (N/A) as a surgical intervention .  The patient's history has been reviewed, patient examined, no change in status, stable for surgery.  I have reviewed the patient's chart and labs.  Questions were answered to the patient's satisfaction.     Rexene Alberts

## 2015-11-21 NOTE — Op Note (Addendum)
CARDIOTHORACIC SURGERY OPERATIVE NOTE  Date of Procedure:  11/21/2015  Preoperative Diagnosis:   Severe Mitral Regurgitation  S/P Coronary Artery Bypass Grafting and Maze Procedure  Postoperative Diagnosis: Same  Procedure:    Minimally-Invasive Reoperation for Mitral Valve Repair  Complex valvuloplasty including artificial Gore-tex neochord placement x12  Sorin Memo 3D Ring Annuloplasty (size 53mm, catalog # [id number], serial # [id number])  Drainage of bilateral pleural effusions    Surgeon: Valentina Gu. Roxy Manns, MD  Assistant: John Giovanni, PA-C  Anesthesia: Roberts Gaudy, MD  Operative Findings:  Type II mitral valve dysfunction with severe prolapse of the anterior leaflet and posterior commissure with severe (4+) mitral regurgitation  Ischemic mitral regurgitation due to elongation without rupture of anterior portion of posterior papillary muscle secondary to recent myocardial infarction  Normal LV systolic function  Mild-moderate RV chamber enlargement with RV systolic dysfunction  Severe pulmonary hypertension  No residual mitral regurgitation after successful valve repair                   BRIEF CLINICAL NOTE AND INDICATIONS FOR SURGERY  Patient is a 65 year old male with history of coronary artery disease, hypertension, borderline hyperlipidemia, lupus anticoagulant positive with remote history of pulmonary embolus, polymyalgia rheumatica, and chronic persistent atrial fibrillation who underwent coronary artery bypass grafting 3 and Maze procedure on 05/29/2015 for severe three-vessel coronary artery disease with abnormal stress test and history of chronic persistent atrial fibrillation. Grafts placed at the time of surgery included left internal mammary artery to the distal left anterior descending coronary artery, saphenous vein graft to the distal right coronary artery, and saphenous vein graft to the second obtuse marginal branch of the left circumflex  coronary artery. The terminal portion of the second obtuse marginal branch of the left circumflex coronary artery was notably diffusely diseased. Intraoperative transesophageal echocardiogram performed at the time of surgery revealed normal left ventricular function with only mild central mitral regurgitation. The patient's early postoperative recovery was entirely uncomplicated. He was last seen in our office on 08/11/2015 at which time he was doing remarkably well, although he appeared to be in rate controlled atrial flutter. He was seen in follow-up shortly after that by Dr. [place] and plans for elective DC cardioversion were discussed. Over the next several weeks the patient developed worsening shortness of breath and congestion. He was seen in urgent care and treated for possible community acquired pneumonia. He was hospitalized briefly in May with acute exacerbation of chronic diastolic congestive heart failure. Echocardiogram performed at that time revealed normal left ventricular function with ejection fraction estimated 50-55%. There was reportedly "moderate" mitral regurgitation that was directed posteriorly, severe pulmonary hypertension, and mild to moderate tricuspid regurgitation. DC cardioversion that was unsuccessful. He was subsequently loaded with amiodarone. Symptoms continued to deteriorate and the patient was readmitted to the hospital 09/27/2015 with respiratory failure and volume overload. Initially the patient appeared to be in atrial flutter but he spontaneously converted to sinus rhythm. Symptoms improved with medical therapy and he was discharged home. The patient was seen in follow-up on 2 occasions in early June and continued to complain of exertional shortness of breath and fatigue. He ultimately sought a second opinion and was evaluated by Dr. Einar Gip who noted a prominent holosystolic murmur on physical exam. Transthoracic echocardiogram performed in his office suggested the  presence of significant mitral regurgitation. The patient underwent transesophageal echocardiogram on 10/30/2015 that revealed severe mitral regurgitation. The jet of regurgitation was eccentric and directed posteriorly with suggestion of  myxomatous degeneration with prolapse of the anterior leaflet. Left ventricular systolic function appeared normal with ejection fraction estimated 50-55%. No other significant abnormalities were noted. Chest x-ray performed at that time revealed a large right pleural effusion. The patient underwent thoracentesis yielding more than 1 L of fluid. Symptoms improved considerably. Left and right heart catheterization was performed 11/17/2015. This revealed patent left internal mammary artery to the distal left anterior descending coronary artery and patent saphenous vein graft to the distal right coronary artery. The saphenous vein graft to the second obtuse marginal branch was occluded as was the entire second obtuse marginal branch. There was severe pulmonary hypertension with PA pressures measured 72/27. The patient was referred for surgical consultation.  The patient has been seen in consultation and counseled at length regarding the indications, risks and potential benefits of surgery.  All questions have been answered, and the patient provides full informed consent for the operation as described.    DETAILS OF THE OPERATIVE PROCEDURE  Preparation:  The patient is brought to the operating room on the above mentioned date and central monitoring was established by the anesthesia team including placement of Swan-Ganz catheter through the left subclavian vein.  There was severe pulmonary hypertension.  A radial arterial line is placed. The patient is placed in the supine position on the operating table.  Intravenous antibiotics are administered. General endotracheal anesthesia is induced uneventfully. The patient is initially intubated using a dual lumen endotracheal tube.   A Foley catheter is placed.  Baseline transesophageal echocardiogram was performed.  Findings were notable for severe prolapse involving the anterior leaflet of the mitral valve including the A2 and A3 segments with severe mitral regurgitation. The regurgitation was directed eccentrically posteriorly. There was flow reversal in the pulmonary veins.  There were no obvious ruptured chordae tendineae.  There was no restriction of the posterior leaflet. Left ventricular systolic function appeared only mildly reduced with no significant wall motion abnormalities. There was some right ventricular chamber enlargement and mild-to-moderate right ventricular dysfunction with flattening of the interventricular septum.  There was only trace tricuspid regurgitation. There was trivial aortic insufficiency. No other significant abnormalities were noted.  A soft roll is placed behind the patient's left scapula and the neck gently extended and turned to the left.   The patient's right neck, chest, abdomen, both groins, and both lower extremities are prepared and draped in a sterile manner. A time out procedure is performed.  Surgical Approach:  A right miniature anterolateral thoracotomy incision is performed. The incision is placed just lateral to and superior to the right nipple. The pectoralis major muscle is retracted medially and completely preserved. The right pleural space is entered through the 3rd intercostal space. A soft tissue retractor is placed.  Two 11 mm ports are placed through separate stab incisions inferiorly. The right pleural space is insufflated continuously with carbon dioxide gas through the posterior port during the remainder of the operation.     Extracorporeal Cardiopulmonary Bypass and Myocardial Protection:  A small incision is made in the right inguinal crease and the anterior surface of the right common femoral artery and right common femoral vein are identified.  The patient is placed in  Trendelenburg position. The right internal jugular vein is cannulated with Seldinger technique and a guidewire advanced into the right atrium. The patient is heparinized systemically. The right internal jugular vein is cannulated with a 14 [place] pediatric femoral venous cannula. Pursestring sutures are placed on the anterior surface of the right  common femoral vein and right common femoral artery. The right common femoral vein is cannulated with the Seldinger technique and a guidewire is advanced under transesophageal echocardiogram guidance through the right atrium. The femoral vein is cannulated with a long 22 French femoral venous cannula. The right common femoral artery is cannulated with Seldinger technique and a flexible guidewire is advanced until it can be appreciated intraluminally in the descending thoracic aorta on transesophageal echocardiogram. The femoral artery is cannulated with an 18 French femoral arterial cannula.  Adequate heparinization is verified.     The entire pre-bypass portion of the operation was notable for stable hemodynamics.  Cardiopulmonary bypass was begun.  Vacuum assist venous drainage is utilized.   A longitudinal incision is made in the pericardium 2 cm anterior to the phrenic nerve. Sharp dissection is utilized to carefully dissected the pericardium away from the surface of the right atrium. There were adhesions from the patient's previous surgery but dissection was technically straightforward.  The intra-atrial groove is easily identified and dissected in both directions. Silk traction sutures are utilized to retract the pericardium in both directions to facilitate exposure. Dissection is continued anteriorly to identify the patent saphenous vein graft to the right coronary artery. Dissection is continued inferiorly beyond the distal anastomosis of the right coronary artery graft to identify the inferior wall of the right ventricle. A bipolar epicardial pacing wires  placed into the surface of the right ventricular free wall.  The patient is placed in Trendelenburg's position.  Systemic cooling is begun and the patient is cooled to 28 systemic temperature. Rapid ventricular pacing is employed to induce ventricular fibrillation. The entire mitral valve repair is performed under cold fibrillatory arrest.   Mitral Valve Repair:  A left atriotomy incision was performed through the interatrial groove and extended partially across the back wall of the left atrium after opening the oblique sinus inferiorly.  The mitral valve is exposed using a self-retaining retractor.  The mitral valve was inspected and notable for normal appearing mitral valve leaflets. There is obvious prolapse involving the A2 and A3 segments of the anterior leaflet of the mitral valve as well as the posterior commissure in the P3 segment of the posterior leaflet. There are no ruptured chordae tendineae. On careful inspection of the subvalvular apparatus, the anterior portion of the posterior papillary muscle appears discolored and elongated suggestive of recent myocardial infarction involving the head of the papillary muscle. However, the papillary muscle remains intact and is not ruptured or torn. The prolapse appears to be secondary to elongation of the posterior papillary muscle without rupture of the papillary muscle rather than secondary to primary degenerative myxomatous disease.  Artificial neochord placement was performed using Chord-X multi-strand CV-4 Goretex pre-measured loops.  The appropriate cord length was measured 20 mm from corresponding normal length primary cords from the A2 segment of the anterior leaflet. The papillary muscle suture of the Chord-X multi-strand suture was placed through the posterior (non-infarcted) head of the posterior papillary muscle in a horizontal mattress fashion and tied over Teflon felt pledgets. Each of the three pre-measured loops were then reimplanted into  the free margin of the A2 and A3 segments of the anterior leaflet.  The valve appears reasonably competent after this is been completed although there remained some residual prolapse of the P3 segment of the posterior leaflet and the posterior commissure.  Interrupted 2-0 Ethibond horizontal mattress sutures are placed circumferentially around the entire mitral valve annulus. The sutures will ultimately be utilized for  ring annuloplasty, and at this juncture there are utilized to suspend the valve symmetrically.  Once the annuloplasty sutures have been placed the valve is again tested with saline. There remained some prolapse of P3 and the posterior commissure. A second set of 20 mm neochords were placed using Chord-X multi-strand CV-4 Goretex pre-measured loops.  The papillary muscle suture of the Chord-X multi-strand suture was placed through the posterior (non-infarcted) head of the posterior papillary muscle in a horizontal mattress fashion and tied over Teflon felt pledgets. Each of the three pre-measured loops were then reimplanted into the free margin of the A3 and P3 segments of the anterior and posterior leaflets.  A grand total of 8 neo-cords are placed in the anterior leaflet and 4 into the posterior leaflet.  The valve was tested with saline and appeared competent even without ring annuloplasty complete. The valve was sized to a 32 mm annuloplasty ring, based upon the transverse distance between the left and right commissures and the height of the anterior leaflet, corresponding to a size just slightly larger than the overall surface area of the anterior leaflet.  A Sorin Memo 3D annuloplasty ring (size 32 mm, catalog [id number], serial [id number]) was secured in place uneventfully. All ring sutures were secured using a Cor-knot device.  The valve was tested with saline and appeared competent.  There remained some billowing of P3 without significant prolapse. This was corrected using a pair of everting  4-0 Prolene simple sutures placed to plicate the cleft between P3 and the posterior commissure.  The valve is again tested with saline and appears to be perfectly competent with a broad symmetrical line of coaptation of the anterior and posterior leaflet. There is no residual leak. There was a broad, symmetrical line of coaptation of the anterior and posterior leaflet which was confirmed using the blue ink test.  Rewarming is begun.   Procedure Completion:  The atriotomy was closed using a 2-layer closure of running 3-0 Prolene suture after placing a sump drain across the mitral valve to serve as a left ventricular vent.   Epicardial pacing wires are fixed to the inferior wall of the right ventricule and to the right atrial appendage. The patient is rewarmed to 37C temperature. The patient spontaneously converts to sinus rhythm without need for cardioversion. The left ventricular vent is removed.   The patient is weaned and disconnected from cardiopulmonary bypass.  The patient's rhythm at separation from bypass was AV paced.  The patient was weaned from bypass on low dose milrinone infusion. Total cardiopulmonary bypass time for the operation was 212 minutes.  Followup transesophageal echocardiogram performed after separation from bypass revealed  a well-seated annuloplasty ring in the mitral position with a normal functioning mitral valve. There was no residual leak.  Left ventricular function was unchanged from preoperatively.  There remains some residual right ventricular systolic dysfunction for which low-dose epinephrine infusion is added. The mean gradient across the mitral valve was estimated to be 2 mmHg.  The femoral arterial and venous cannulae were removed uneventfully. There was a palpable pulse in the distal right common femoral artery after removal of the cannula. Protamine was administered to reverse the anticoagulation. The right internal jugular cannula was removed and manual pressure  held on the neck for 15 minutes.  Single lung ventilation was begun. The atriotomy closure was inspected for hemostasis. The pericardial sac was drained using a 28 French Bard drain placed through the anterior port incision.  The pericardium was closed using a  patch of core matrix bovine submucosal tissue patch. The right pleural space is irrigated with saline solution and inspected for hemostasis. The right pleural space was drained using a 28 French Bard drain placed through the posterior port incision. The miniature thoracotomy incision was closed in multiple layers in routine fashion. The right groin incision was inspected for hemostasis and closed in multiple layers in routine fashion.  The post-bypass portion of the operation was notable for stable rhythm and hemodynamics.  A 28 French chest tube was placed into the left pleural space for drainage of a left pleural effusion that was present preoperatively.  The patient received a total of 1 pack adult platelets and 2 units fresh frozen plasma due to coagulopathy and thrombocytopenia after separation from cardiopulmonary bypass and reversal of heparin with protamine.    Disposition:  The patient tolerated the procedure well.  The patient was reintubated using a single lumen endotracheal tube and subsequently transported to the surgical intensive care unit in stable condition. There were no intraoperative complications. All sponge instrument and needle counts are verified correct at completion of the operation.     Valentina Gu. Roxy Manns MD 11/21/2015 2:42 PM

## 2015-11-21 NOTE — Anesthesia Postprocedure Evaluation (Signed)
Anesthesia Post Note  Patient: Peter Bruce  Procedure(s) Performed: Procedure(s) (LRB): MINIMALLY INVASIVE REOPERATION FOR MITRAL VALVE REPAIR (Right) TRANSESOPHAGEAL ECHOCARDIOGRAM (TEE) (N/A) LEFT CHEST TUBE INSERTION  Patient location during evaluation: SICU Anesthesia Type: General Level of consciousness: patient remains intubated per anesthesia plan and sedated Pain management: pain level controlled Vital Signs Assessment: post-procedure vital signs reviewed and stable Respiratory status: patient on ventilator - see flowsheet for VS and patient remains intubated per anesthesia plan Cardiovascular status: blood pressure returned to baseline Anesthetic complications: no    Last Vitals:  Filed Vitals:   11/21/15 1700 11/21/15 1715  BP: 110/68   Pulse: 80 84  Temp: 35.5 C 35.7 C  Resp: 14 17    Last Pain: There were no vitals filed for this visit.               Jo Booze COKER

## 2015-11-21 NOTE — OR Nursing (Signed)
First call made to SICU @1351 

## 2015-11-21 NOTE — Consult Note (Signed)
Intra-operative Transesophageal Echocardiography Report:  Peter Bruce a 65 year old male with severe mitral regurgitation. He previously underwent coronary artery bypass grafting 3 with Maze procedure on 05/29/2015 for severe three-vessel coronary disease and chronic persistent atrial fibrillation. At the time of surgery, there was mild mitral regurgitation noted on transesophageal echocardiography. However 3-4 months following surgery he developed progressive exertional shortness of breath. He was subsequently found to have severe mitral regurgitation. He is now scheduled to undergo mitral valve repair by Dr. Roxy Manns. Intraoperative transesophageal echocardiography was indicated to evaluate the mitral valve and assist with the repair, to assess for any other valvular pathology, and to serve as a monitor for intraoperative volume status and cardiac function.  The patient was brought to the operating room at [facility] and general anesthesia was induced without difficulty. Following endotracheal intubation and orogastric suctioning, the transesophageal echocardiography probe was inserted into the esophagus without difficulty.  Impression: Pre-bypass findings:  1. Aortic valve: The aortic valve was trileaflet. The leaflets were mildly thickened but opened normally without restriction. There was trace aortic insufficiency.  2. Mitral valve: There was severe mitral regurgitation due to anterior leaflet prolapse in the A2/A3 region. There were no flail segments noted but there was clear override of the anterior leaflet over the posterior leaflet. There was a posteriorly directed jet of  mitral insufficiency which tracted along the posterior wall of the left atrium. The vena contracta measured 0.81 cm. There was clear systolic flow reversal in the left upper and right upper pulmonary veins. The motion of the posterior leaflet did not appear restricted.  3. Left ventricle: There was left ventricular  enlargement with normal appearing left ventricular systolic function. There appeared to be normal contractility in all segments interrogated. The left ventricular end-diastolic diameter was 5.7 cm at the mid-papillary level in the transgastric short axis view. There was mild concentric left ventricular hypertrophy. The anterior wall measured 1.15 cm and the posterior wall measured 1.1 cm at end diastole at the mid-papillary level. There was flattening of the interventricular septum consistent with right ventricular dysfunction. The left ventricular ejection fraction was estimated at 55-60%.  4. Right ventricle: There was moderate to severe right ventricular dysfunction. The right ventricular cavity was enlarged and and had a cross-sectional area equal to that of the left ventricle in the mid-esophageal 4 chamber view. There was markedly reduced contractility of the right ventricular free wall. There was interventricular septal flattening noted.  5. Tricuspid valve: The tricuspid annulus was enlarged and measured 4.8 cm in the mid-esophageal 4 chamber view at mid diastole. There was 1+ tricuspid insufficiency.  6. Interatrial septum: The interatrial septum was intact without evidence of patent foramen ovale or atrial septal defect by color Doppler and bubble study.  7. Left atrium: There was no thrombus noted within the left atrium. The left atrial appendage could not be visualized as the patient had a previous ligation of the left atrial appendage.  8. Ascending aorta: There was a well-defined aortic root and sinotubular ridge without effacement or dilatation. There was mild thickening of the ascending aorta but no protruding atheromatous disease noted.  9. Descending aorta: There was scattered mild atheromatous disease noted within the wall of the descending aorta. The descending aorta was of normal diameter and measured 2.60 cm in diameter.  10. There was a moderate sized left pleural effusion  noted.  Post-bypass findings:  1. Aortic valve: The aortic valve was unchanged from the pre-bypass study. The leaflets were mildly thickened but  opened normally without restriction. There  was trace aortic insufficiency.  2. Mitral valve: There was an annuloplasty ring in the mitral position. The posterior leaflet was fixed and the anterior leaflet was  mobile. There was no mitral insufficiency noted. The mean transmitral gradient was 2 mmHg by continuous wave Doppler.  3. Left ventricle:  There was a dyssynchronous contractile pattern due to ventricular pacing. There were no regional wall motion abnormalities and the ejection fraction was estimated at 50-55%.  4. Right ventricle: There was moderate right ventricular dysfunction. The contractility of the right ventricular free wall while still below normal was improved from the pre-bypass study.  5. Tricuspid valve: There was 1+ tricuspid insufficiency which appeared unchanged from the pre-bypass study.  Roberts Gaudy, M.D.

## 2015-11-21 NOTE — Transfer of Care (Signed)
Immediate Anesthesia Transfer of Care Note  Patient: Peter Bruce  Procedure(s) Performed: Procedure(s): MINIMALLY INVASIVE REOPERATION FOR MITRAL VALVE REPAIR (Right) TRANSESOPHAGEAL ECHOCARDIOGRAM (TEE) (N/A) LEFT CHEST TUBE INSERTION  Patient Location: PACU  Anesthesia Type:General  Level of Consciousness: Patient remains intubated per anesthesia plan  Airway & Oxygen Therapy: Patient remains intubated per anesthesia plan and Patient placed on Ventilator (see vital sign flow sheet for setting)  Post-op Assessment: Report given to RN and Post -op Vital signs reviewed and stable  Post vital signs: Reviewed and stable  Last Vitals:  Filed Vitals:   11/21/15 0638  BP: 123/76  Pulse: 78  Temp: 36.8 C  Resp: 16    Last Pain: There were no vitals filed for this visit.       Complications: No apparent anesthesia complications

## 2015-11-21 NOTE — Progress Notes (Signed)
Utilization review completed.  

## 2015-11-21 NOTE — Progress Notes (Signed)
  Echocardiogram Echocardiogram Transesophageal has been performed.  Bobbye Charleston 11/21/2015, 9:17 AM

## 2015-11-21 NOTE — Anesthesia Procedure Notes (Addendum)
Procedure Name: Intubation Date/Time: 11/21/2015 7:55 AM Performed by: Salli Quarry Daiveon Markman Pre-anesthesia Checklist: Patient identified, Emergency Drugs available, Suction available and Patient being monitored Patient Re-evaluated:Patient Re-evaluated prior to inductionOxygen Delivery Method: Circle system utilized Preoxygenation: Pre-oxygenation with 100% oxygen Intubation Type: IV induction Ventilation: Mask ventilation without difficulty Laryngoscope Size: Glidescope and 4 Grade View: Grade I Tube type: Oral Tube size: 8.0 mm Number of attempts: 1 Airway Equipment and Method: Rigid stylet Placement Confirmation: ETT inserted through vocal cords under direct vision,  positive ETCO2 and breath sounds checked- equal and bilateral ETT to lip (cm): tube not secured, placed for exchange with DLEBT. Dental Injury: Teeth and Oropharynx as per pre-operative assessment  Comments: Additional tissue obstructing view of glottis.  Able to view with glidescope, subsequent mac placement for cook exchange revealed a nearly complete obstruction of view with MAC. ETT placed by Neville Route, SRNA    Procedure Name: Intubation Date/Time: 11/21/2015 8:01 AM Performed by: Salli Quarry Jody Aguinaga Pre-anesthesia Checklist: Patient identified, Emergency Drugs available, Suction available and Patient being monitored Patient Re-evaluated:Patient Re-evaluated prior to inductionOxygen Delivery Method: Circle system utilized Preoxygenation: Pre-oxygenation with 100% oxygen Laryngoscope Size: Mac and 3 Grade View: Grade IV Endobronchial tube: Left, Double lumen EBT, EBT position confirmed by auscultation and EBT position confirmed by fiberoptic bronchoscope and 37 Fr Number of attempts: 1 Intubation method: exchange catheter used with existing 8.0 ETT placed with glide. Placement Confirmation: positive ETCO2 and breath sounds checked- equal and bilateral Secured at: 26 cm Tube secured with: Tape Dental  Injury: Teeth and Oropharynx as per pre-operative assessment and Bloody posterior oropharynx  Comments: Pulmonary edema appreciated with moderate bloody secretions in pharynx and trachea, ETT placed by Octavio Graves, SRNA    Procedure Name: Intubation Date/Time: 11/21/2015 2:50 PM Performed by: Salli Quarry Torii Royse Pre-anesthesia Checklist: Patient identified, Emergency Drugs available, Suction available and Patient being monitored Patient Re-evaluated:Patient Re-evaluated prior to inductionOxygen Delivery Method: Circle System Utilized Preoxygenation: Pre-oxygenation with 100% oxygen Laryngoscope Size: Glidescope and 4 Grade View: Grade I Tube type: Parker flex tip Tube size: 8.0 mm Number of attempts: 1 Airway Equipment and Method: Stylet (tube exchanger) Placement Confirmation: ETT inserted through vocal cords under direct vision,  positive ETCO2 and breath sounds checked- equal and bilateral Secured at: 22 cm Tube secured with: Tape Dental Injury: Teeth and Oropharynx as per pre-operative assessment

## 2015-11-21 NOTE — Progress Notes (Signed)
Rapid Wean Protocol begun, patient is tolerating it well and RN at bedside.

## 2015-11-21 NOTE — Brief Op Note (Addendum)
11/21/2015  1:08 PM       [address]       [place] [address]             [phone]     11/21/2015  1:09 PM  PATIENT:  Peter Bruce  65 y.o. male  PRE-OPERATIVE DIAGNOSIS:  MR  POST-OPERATIVE DIAGNOSIS:  MR  PROCEDURE:  Procedure(s): MINIMALLY INVASIVE REOPERATION FOR MITRAL VALVE REPAIR COMPLEX REPAIR WITH 32 MEMO 3D RING ANNULOPLASTY, REF [id number] TRANSESOPHAGEAL ECHOCARDIOGRAM (TEE) PLACEMENT LEFT PLEURAL CHEST TUBE  SURGEON:    Rexene Alberts, MD  ASSISTANTS:  John Giovanni, PA-C  ANESTHESIA:   Roberts Gaudy, MD  CROSSCLAMP TIME:   0  CARDIOPULMONARY BYPASS TIME: 212'  FINDINGS:  Type II mitral valve dysfunction with severe prolapse of the anterior leaflet and posterior commissure with severe (4+) mitral regurgitation  Ischemic mitral regurgitation due to elongation without rupture of anterior portion of posterior papillary muscle secondary to recent myocardial infarction  Normal LV systolic function  Mild-moderate RV chamber enlargement with RV systolic dysfunction  Severe pulmonary hypertension  No residual mitral regurgitation after successful valve repair  COMPLICATIONS: None  BASELINE WEIGHT: 83 kg  PATIENT DISPOSITION:   TO SICU IN STABLE CONDITION  Rexene Alberts, MD 11/21/2015 2:27 PM

## 2015-11-21 NOTE — Anesthesia Preprocedure Evaluation (Addendum)
Anesthesia Evaluation  Patient identified by MRN, date of birth, ID band Patient awake    Reviewed: Allergy & Precautions, NPO status , Patient's Chart, lab work & pertinent test results  Airway Mallampati: II  TM Distance: >3 FB Neck ROM: Full    Dental  (+) Teeth Intact   Pulmonary    breath sounds clear to auscultation       Cardiovascular hypertension,  Rhythm:Regular Rate:Normal + Systolic murmurs    Neuro/Psych    GI/Hepatic   Endo/Other    Renal/GU      Musculoskeletal   Abdominal   Peds  Hematology   Anesthesia Other Findings   Reproductive/Obstetrics                           Anesthesia Physical Anesthesia Plan  ASA: III  Anesthesia Plan: General   Post-op Pain Management:    Induction: Intravenous  Airway Management Planned: Double Lumen EBT  Additional Equipment: Arterial line, 3D TEE, CVP and PA Cath  Intra-op Plan:   Post-operative Plan: Post-operative intubation/ventilation  Informed Consent: I have reviewed the patients History and Physical, chart, labs and discussed the procedure including the risks, benefits and alternatives for the proposed anesthesia with the patient or authorized representative who has indicated his/her understanding and acceptance.   Dental advisory given  Plan Discussed with:   Anesthesia Plan Comments:         Anesthesia Quick Evaluation

## 2015-11-21 NOTE — Progress Notes (Signed)
[facility] BRIEF SICU PROGRESS NOTE  Day of Surgery  S/P Procedure(s) (LRB): MINIMALLY INVASIVE REOPERATION FOR MITRAL VALVE REPAIR (Right) TRANSESOPHAGEAL ECHOCARDIOGRAM (TEE) (N/A) LEFT CHEST TUBE INSERTION   sedated on vent Afib w/ stable HR and hemodynamics O2 sats 100% Chest tube output low UOP adequate Labs okay  Plan: Continue routine early postop  Rexene Alberts, MD 11/21/2015 7:44 PM

## 2015-11-21 NOTE — H&P (View-Only) (Signed)
[address]       [place] [address]             [phone]     CARDIOTHORACIC SURGERY CONSULTATION REPORT  Referring Provider is Adrian Prows, MD PCP is DOOLITTLE, Linton Ham, MD  Chief Complaint  Patient presents with  . Routine Post Op    S/P CABG/MAZE...Marland Kitchennow referred for severe MR..TEE 10/30/15, CTA C/A/P 11/12/15, CATH 11/17/15  . Mitral Regurgitation    HPI:  Patient is a 65 year old male with history of coronary artery disease, hypertension, borderline hyperlipidemia, lupus anticoagulant positive with remote history of pulmonary embolus, polymyalgia rheumatica, and chronic persistent atrial fibrillation who underwent coronary artery bypass grafting 3 and Maze procedure on 05/29/2015 for severe three-vessel coronary artery disease with abnormal stress test and history of chronic persistent atrial fibrillation. Grafts placed at the time of surgery included left internal mammary artery to the distal left anterior descending coronary artery, saphenous vein graft to the distal right coronary artery, and saphenous vein graft to the second obtuse marginal branch of the left circumflex coronary artery. The terminal portion of the second obtuse marginal branch of the left circumflex coronary artery was notably diffusely diseased. Intraoperative transesophageal echocardiogram performed at the time of surgery revealed normal left ventricular function with only mild central mitral regurgitation. The patient's early postoperative recovery was entirely uncomplicated.  He was last seen in our office on 08/11/2015 at which time he was doing remarkably well, although he appeared to be in rate controlled atrial flutter.  He was seen in follow-up shortly after that by Dr. Martinique and plans for elective DC cardioversion were discussed. Over the next several weeks the patient developed worsening shortness of breath and congestion.  He was seen in urgent care and treated for possible  community acquired pneumonia. He was hospitalized briefly in May with acute exacerbation of chronic diastolic congestive heart failure.  Echocardiogram performed at that time revealed normal left ventricular function with ejection fraction estimated 50-55%. There was reportedly "moderate" mitral regurgitation that was directed posteriorly, severe pulmonary hypertension, and mild to moderate tricuspid regurgitation.  DC cardioversion that was unsuccessful. He was subsequently loaded with amiodarone. Symptoms continued to deteriorate and the patient was readmitted to the hospital 09/27/2015 with respiratory failure and volume overload.  Initially the patient appeared to be in atrial flutter but he spontaneously converted to sinus rhythm. Symptoms improved with medical therapy and he was discharged home. The patient was seen in follow-up on 2 occasions in early June and continued to complain of exertional shortness of breath and fatigue. He ultimately sought a second opinion and was evaluated by Dr. Einar Gip who noted a prominent holosystolic murmur on physical exam. Transthoracic echocardiogram performed in his office suggested the presence of significant mitral regurgitation. The patient underwent transesophageal echocardiogram on 10/30/2015 that revealed severe mitral regurgitation.  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  Left ventricular systolic function appeared normal with ejection fraction estimated 50-55%. No other significant abnormalities were noted.  Chest x-ray performed at that time revealed a large right pleural effusion. The patient underwent thoracentesis yielding more than 1 L of fluid. Symptoms improved considerably.  Left and right heart catheterization was performed 11/17/2015. This revealed patent left internal mammary artery to the distal left anterior descending coronary artery and patent  saphenous vein graft to the distal right coronary artery. The saphenous vein graft to  the second obtuse marginal branch was occluded as was the entire second obtuse marginal branch. There was severe pulmonary hypertension with PA pressures measured 72/27. The patient was referred for surgical consultation.  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 The patient has been retired for several years but works every day helping his wife with the kennel and the 3 acres of property they live on.  The patient states that initially he recovered remarkably well after his surgery last January. He was doing very well when I saw him in early April. He states that several weeks after that he began to develop worsening shortness of breath, congestion, and fatigue. Symptoms have improved recently on medical therapy but he still does not feel any one year as good as he did 3 months ago.  He gets short of breath with activity.  He denies any resting shortness of breath, PND, orthopnea, or lower extremity edema. He has never had any chest pain or chest tightness.  Past Medical History  Diagnosis Date  . Hypertension   . PMR (polymyalgia rheumatica) (HCC)   . Acute pulmonary embolus ([place]) 2010  . Hypercholesterolemia   . Atrial fibrillation [facility])     s/p inpatient DCCV 09/11/2015  . Coronary artery disease involving native coronary artery 05/15/2015    multivessel  . Lupus anticoagulant positive 07/29/2008  . History of kidney stones   . 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 S/P Maze operation for atrial fibrillation 05/29/2015    Complete bilateral atrial lesion set via median sternotomy using bipolar radiofrequency and cryothermy ablation with clipping of LA  appendage  . Atrial flutter ([place])   . Mitral regurgitation 10/30/2015  . Dysrhythmia     A-fib  . CHF (congestive heart failure) ([place])   . Pneumonia   . Arthritis   . Fibromyalgia     Past Surgical History  Procedure Laterality Date  . Appendectomy    . Hernia repair    . Shoulder surgery Left     clavicular fracture  . Arm surgery  Right     wrist and elbow  . Cardiac catheterization N/A 05/15/2015    Procedure: Left Heart Cath and Coronary Angiography;  Surgeon: Peter M Martinique, MD;  Location: [place] CV LAB;  Service: Cardiovascular;  Laterality: N/A;  . Coronary artery bypass graft N/A 05/29/2015    Procedure: CORONARY ARTERY BYPASS GRAFTING (CABG), ON PUMP, TIMES THREE, USING LEFT INTERNAL MAMMARY ARTERY, BILATERAL GREATER SAPHENOUS VEINS HARVESTED ENDOSCOPICALLY;  Surgeon: Rexene Alberts, MD;  Location: [place];  Service: Open Heart Surgery;  Laterality: N/A;  . Maze N/A 05/29/2015    Procedure: MAZE;  Surgeon: Rexene Alberts, MD;  Location: [place];  Service: Open Heart Surgery;  Laterality: N/A;  Complete Bi-Atrial Lesion set with Ablation and Cryothermy  . Tee without cardioversion N/A 05/29/2015    Procedure: TRANSESOPHAGEAL ECHOCARDIOGRAM (TEE);  Surgeon: Rexene Alberts, MD;  Location: [place];  Service: Open Heart Surgery;  Laterality: N/A;  . Clipping of atrial appendage  05/29/2015    Procedure: CLIPPING OF LEFT ATRIAL APPENDAGE;  Surgeon: Rexene Alberts, MD;  Location: [place];  Service: Open Heart Surgery;;  45 AtriClip PRO 145  . Cardioversion N/A 09/11/2015    Procedure: CARDIOVERSION;  Surgeon: Lelon Perla, MD;  Location[facility] ENDOSCOPY;  Service: Cardiovascular;  Laterality: N/A;  . Tee without cardioversion N/A 10/30/2015    Procedure: TRANSESOPHAGEAL ECHOCARDIOGRAM (TEE);  Surgeon: Adrian Prows, MD;  Location[facility] ENDOSCOPY;  Service: Cardiovascular;  Laterality: N/A;  . Fracture surgery      right wrist  . Cardiac catheterization N/A 11/17/2015    Procedure: Right/Left  Heart Cath and Coronary/Graft Angiography;  Surgeon: Adrian Prows, MD;  Location: [place] CV LAB;  Service: Cardiovascular;  Laterality: N/A;  . Peripheral vascular catheterization Bilateral 11/17/2015    Procedure: Renal Angiography;  Surgeon: Adrian Prows, MD;  Location: [place] [facility] LAB;  Service: Cardiovascular;  Laterality: Bilateral;  . Peripheral vascular catheterization N/A 11/17/2015    Procedure: Abdominal Aortogram;  Surgeon: Adrian Prows, MD;  Location: [place] CV LAB;  Service: Cardiovascular;  Laterality: N/A;    Family History  Problem Relation Age of Onset  . Cancer Mother   . Other Sister     chf    Social History   Social History  . Marital Status: Married    Spouse Name: N/A  . Number of Children: N/A  . Years of Education: N/A   Occupational History  . licensed Chief Financial Officer     self-employed   Social History Main Topics  . Smoking status: Never Smoker   . Smokeless tobacco: Never Used  . Alcohol Use: No  . Drug Use: No  . Sexual Activity: Not on file   Other Topics Concern  . Not on file   Social History Narrative   Lives with his wife (married 1990).  Former Health visitor. Body building/weight lifting, running for exercise.    Current Outpatient Prescriptions  Medication Sig Dispense Refill  . acetaminophen (TYLENOL) 500 MG tablet Take 1,000 mg by mouth every 8 (eight) hours as needed for mild pain or moderate pain.    . diazepam (VALIUM) 10 MG tablet Take 10 mg by mouth every 6 (six) hours as needed for anxiety.    . furosemide (LASIX) 40 MG tablet Take 1 tablet (40 mg total) by mouth as needed for edema. (Patient taking differently: Take 20 mg by mouth 2 (two) times daily. ) 30 tablet 1  . hydrALAZINE (APRESOLINE) 25 MG tablet Take 1 tablet (25 mg total) by mouth every 8 (eight) hours. 90 tablet 0  . isosorbide mononitrate (IMDUR) 120 MG 24 hr tablet Take 1 tablet (120 mg total) by mouth daily. 30 tablet 1  . metoprolol succinate  (TOPROL-XL) 25 MG 24 hr tablet Take 1 tablet (25 mg total) by mouth daily. 90 tablet 1  . Rivaroxaban (XARELTO) 15 MG TABS tablet Take 1 tablet (15 mg total) by mouth daily with supper. 30 tablet 1  . valsartan (DIOVAN) 160 MG tablet Take 1 tablet (160 mg total) by mouth daily.    Marland Kitchen zolpidem (AMBIEN) 10 MG tablet Take 10 mg by mouth daily.      No current facility-administered medications for this visit.    Allergies  Allergen Reactions  . Amitriptyline Shortness Of Breath  . Heparin Other (See Comments)    HIT Ab positive on 05/31/15, SRA NEGATIVE on 06/02/15  Patient can not remember what happened   . Other Nausea And Vomiting    SSRI uptake: makes him extremely sick  . Serotonin Reuptake Inhibitors (Ssris) Nausea And Vomiting    SSRI uptake makes him  extremely sick.      Review of Systems:   General:  decreased appetite, decreased energy, no weight gain, no weight loss, no fever  Cardiac:  no chest pain with exertion, no chest pain at rest, +SOB with exertion, no resting SOB, no PND, no orthopnea, no palpitations, + arrhythmia, + atrial fibrillation, no LE edema, no dizzy spells, no syncope  Respiratory:  + shortness of breath, + home oxygen, no productive cough, + dry cough, no bronchitis, no wheezing, no hemoptysis, no asthma, no pain with inspiration or cough, no sleep apnea, no CPAP at night  GI:   no difficulty swallowing, no reflux, no frequent heartburn, no hiatal hernia, no abdominal pain, no constipation, no diarrhea, no hematochezia, no hematemesis, no melena  GU:   no dysuria,  no frequency, no urinary tract infection, no hematuria, no enlarged prostate, no kidney stones, no kidney disease  Vascular:  no pain suggestive of claudication, no pain in feet, no leg cramps, no varicose veins, no DVT, no non-healing foot ulcer  Neuro:   no stroke, no TIA's, no seizures, no headaches, no temporary blindness one eye,  no slurred speech, no peripheral neuropathy, no chronic pain, no  instability of gait, no memory/cognitive dysfunction  Musculoskeletal: no arthritis, no joint swelling, no myalgias, no difficulty walking, normal mobility   Skin:   no rash, no itching, no skin infections, no pressure sores or ulcerations  Psych:   no anxiety, + depression, + nervousness, + unusual recent stress  Eyes:   no blurry vision, no floaters, no recent vision changes, does not wear glasses or contacts  ENT:   no hearing loss, no loose or painful teeth, no dentures  Hematologic:  no easy bruising, no abnormal bleeding, no clotting disorder, no frequent epistaxis  Endocrine:  no diabetes, does not check CBG's at home     Physical Exam:   BP 130/95 mmHg  Pulse 84  Resp 16  Ht 5\' 10"  (1.778 m)  Wt 183 lb (83.008 kg)  BMI 26.26 kg/m2  SpO2 93%  General:    well-appearing  HEENT:  Unremarkable   Neck:   no JVD, no bruits, no adenopathy   Chest:   clear to auscultation, symmetrical breath sounds, no wheezes, no rhonchi   CV:   RRR, grade III/VI holosystolic murmur best at apex  Abdomen:  soft, non-tender, no masses   Extremities:  warm, well-perfused, pulses palpable, no LE edema  Rectal/GU  Deferred  Neuro:   Grossly non-focal and symmetrical throughout  Skin:   Clean and dry, no rashes, no breakdown   Diagnostic Tests:  CARDIAC CATHETERIZATION Procedures    Left Heart Cath and Coronary Angiography    Conclusion     Mid RCA lesion, 70% stenosed.  Mid RCA to Dist RCA lesion, 85% stenosed.  Acute Mrg lesion, 95% stenosed.  Prox LAD to Mid LAD lesion, 90% stenosed.  Mid LAD lesion, 100% stenosed.  1st Diag lesion, 95% stenosed.  Ost 1st Mrg lesion, 80% stenosed.  Mid Cx lesion, 90% stenosed.  3rd Mrg lesion, 85% stenosed.  The left ventricular systolic function is normal.  1. Severe 3 vessel obstructive CAD 2. Low normal LV systolic function  Plan: Patient has severe 3 vessel disease with preserved LV function. Syntax score is high. Recommend CABG  with possible MAZE procedure.     Indications    Abnormal nuclear stress test [R93.1 (ICD-10-CM)]    Technique and Indications    Indication: 65 yo WM with  exertional fatigue and newly diagnosed atrial fibrillation. Abnormal stress Myoview study.  Procedural Details: The right wrist was prepped, draped, and anesthetized with 1% lidocaine. Using the modified Seldinger technique, a 6 French slender sheath was introduced into the right radial artery. 3 mg of verapamil was administered through the sheath, weight-based unfractionated heparin was administered intravenously. Standard Judkins catheters were used for selective coronary angiography and left ventriculography. Catheter exchanges were performed over an exchange length guidewire. There were no immediate procedural complications. A TR band was used for radial hemostasis at the completion of the procedure. The patient was transferred to the post catheterization recovery area for further monitoring.  Contrast 80 ccEstimated blood loss <50 mL. There were no immediate complications during the procedure.    Coronary Findings    Dominance: Right   Left Anterior Descending   . Prox LAD to Mid LAD lesion, 90% stenosed. Diffuse.   . Mid LAD lesion, 100% stenosed. Calcified.   . First Diagonal Branch   . 1st Diag lesion, 95% stenosed. Discrete.   . Third Septal Branch   Ost 3rd Sept filled by collaterals from Acute Mrg.     Left Circumflex   . Mid Cx lesion, 90% stenosed.   . First Obtuse Marginal Branch   . Ost 1st Mrg lesion, 80% stenosed.   . Third Obtuse Marginal Branch   . 3rd Mrg lesion, 85% stenosed. Diffuse.     Right Coronary Artery   . Mid RCA lesion, 70% stenosed. Calcified discrete.   . Mid RCA to Dist RCA lesion, 85% stenosed. Moderately Calcified discrete.   . Acute Marginal Branch   . Acute Mrg lesion, 95% stenosed. Discrete.      Wall Motion                 Left Heart    Left Ventricle The left  ventricular size is normal. The left ventricular systolic function is normal. There are wall motion abnormalities in the left ventricle. Mid anterior hypokinesis There are segmental wall motion abnormalities in the left ventricle.    Coronary Diagrams    Diagnostic Diagram            Implants     No implant documentation for this case.    PACS Images    Show images for Cardiac catheterization     Link to Procedure Log    Procedure Log      Hemo Data       Most Recent Value   AO Systolic Pressure  [id number] mmHg   AO Diastolic Pressure  87 mmHg   AO Mean  [id number] mmHg   LV Systolic Pressure  [id number] mmHg   LV Diastolic Pressure  2 mmHg   LV EDP  11 mmHg   Arterial Occlusion Pressure Extended Systolic Pressure  0000000 mmHg   Arterial Occlusion Pressure Extended Diastolic Pressure  80 mmHg   Arterial Occlusion Pressure Extended Mean Pressure  98 mmHg   Left Ventricular Apex Extended Systolic Pressure  [id number] mmHg   Left Ventricular Apex Extended Diastolic Pressure  3 mmHg   Left Ventricular Apex Extended EDP Pressure  12 mmHg       Intraoperative Transesophageal Echocardiography  Patient: Chais, Berger MR #: [id number] Study Date: 05/29/2015 Gender: M Age: 67 Height: 177.8 cm Weight: 87.1 kg BSA: 2.09 m^2 Pt. Status: Room:  ADMITTING Darylene Price, M.D. ATTENDING Darylene Price, M.D. ORDERING Darylene Price, M.D. REFERRING Darylene Price, M.D. SONOGRAPHER [facility] PERFORMING Hoy Morn, MD  cc:  ------------------------------------------------------------------- LV EF: 50% - 55%  ------------------------------------------------------------------- Indications: CAD of native vessels 414.01.  ------------------------------------------------------------------- History: PMH: Atrial fibrillation. Risk factors: Hypertension.  Dyslipidemia.  ------------------------------------------------------------------- Study Conclusions  - Left ventricle: Systolic function was normal. The estimated  ejection fraction was in the range of 50% to 55%. Wall motion was  normal; there were no regional wall motion abnormalities. - Aortic valve: There was trivial regurgitation. - Mitral valve: There was mild regurgitation. - Right atrium: No evidence of thrombus in the atrial cavity or  appendage. - Atrial septum: No defect or patent foramen ovale was identified.  Echo contrast study showed no right-to-left atrial level shunt,  following an increase in RA pressure induced by provocative  maneuvers. - Tricuspid valve: No evidence of vegetation. - Pulmonic valve: No evidence of vegetation.  Intraoperative transesophageal echocardiography. M-mode, complete 2D, spectral Doppler, and color Doppler. Birthdate: Patient birthdate: 12-17-1950. Age: Patient is 65 yr old. Sex: Gender: male. BMI: 27.5 kg/m^2. Blood pressure: 151/97 Patient status: Inpatient. Study date: Study date: 05/29/2015. Study time: 09:05 AM. Location: Operating room.  -------------------------------------------------------------------  ------------------------------------------------------------------- Left ventricle: Systolic function was normal. The estimated ejection fraction was in the range of 50% to 55%. Wall motion was normal; there were no regional wall motion abnormalities.  ------------------------------------------------------------------- Aortic valve: Trileaflet. Doppler: There was trivial regurgitation. Mean gradient (S): 2 mm Hg. Peak gradient (S): 5 mm Hg.  ------------------------------------------------------------------- Aorta: The aorta was mildly calcified. There was no evidence for dissection.  ------------------------------------------------------------------- Mitral valve: Mildly  thickened leaflets . Doppler: There was mild regurgitation.  ------------------------------------------------------------------- Left atrium: The atrium was normal in size.  ------------------------------------------------------------------- Atrial septum: No defect or patent foramen ovale was identified. Echo contrast study showed no right-to-left atrial level shunt, following an increase in RA pressure induced by provocative maneuvers.  ------------------------------------------------------------------- Right ventricle: The cavity size was normal. Wall thickness was normal. Systolic function was normal.  ------------------------------------------------------------------- Pulmonic valve: Structurally normal valve. Cusp separation was normal. No evidence of vegetation.  ------------------------------------------------------------------- Tricuspid valve: Structurally normal valve. Leaflet separation was normal. No evidence of vegetation. Doppler: There was no regurgitation.  ------------------------------------------------------------------- Right atrium: The atrium was normal in size. No evidence of thrombus in the atrial cavity or appendage.  ------------------------------------------------------------------- Pericardium: The pericardium was normal in appearance. There was no pericardial effusion.  ------------------------------------------------------------------- Pre bypass:  Post bypass:  - LV size was normal and unchanged from the prior stage. - LV global systolic function was appropriately augmented from  baseline. The estimated LV ejection fraction was 50%. - There is no diffuse LV hypokinesis. - Normal wall motion; no LV regional wall motion abnormalities.  - RV global systolic function was normal. - No dissection noted in aorta.  Pre bypass: Post  bypass:  ------------------------------------------------------------------- Post procedure conclusions Ascending Aorta:  - The aorta was mildly calcified.  ------------------------------------------------------------------- Measurements  Aortic valve Value Aortic valve peak velocity, S 109 cm/s Aortic valve mean velocity, S 68.4 cm/s Aortic valve VTI, S 22.3 cm Aortic mean gradient, S 2 mm Hg Aortic peak gradient, S 5 mm Hg  Legend: (L) and (H) mark values outside specified reference range.  ------------------------------------------------------------------- Prepared and Electronically Authenticated by  Hoy Morn, MD 2017-01-24T09:26:19   Transthoracic Echocardiography  Patient: Rashidi, Nakayama MR #: [id number] Study Date: 09/09/2015 Gender: M Age: 62 Height: 175.3 cm Weight: 85.3 kg BSA: 2.05 m^2 Pt. Status: Room: Louann MD [place] MD ORDERING Lyman Bishop MD [place] MD PERFORMING [facility], Inpatient SONOGRAPHER  Darlina Sicilian, RDCS  cc:  ------------------------------------------------------------------- LV EF: 50% - 55%  ------------------------------------------------------------------- Indications: CHF - 428.0.  ------------------------------------------------------------------- History: PMH: Lupus. Dyspnea. Atrial flutter. Atrial fibrillation. PMH: Pulmonary Embolism. Pneumonia. CABG with a MAZE 1/119/2017. Risk factors: Hypertension. Dyslipidemia.  ------------------------------------------------------------------- Study Conclusions  - Left ventricle: The cavity size was normal. There was moderate  focal basal hypertrophy of the septum with mild posterior wall  hypertrophy. Systolic function was normal. The  estimated ejection  fraction was in the range of 50% to 55%. Wall motion was normal;  there were no regional wall motion abnormalities. - Aortic valve: Transvalvular velocity was within the normal range.  There was no stenosis. There was no regurgitation. - Mitral valve: Transvalvular velocity was within the normal range.  There was no evidence for stenosis. There was moderate  regurgitation directed posteriorly. - Left atrium: The atrium was moderately dilated. - Right ventricle: The cavity size was normal. Wall thickness was  normal. Systolic function was normal. - Tricuspid valve: There was mild-moderate regurgitation. - Pulmonary arteries: Systolic pressure was severely increased. PA  peak pressure: 67 mm Hg (S). - Inferior vena cava: The vessel was dilated. The respirophasic  diameter changes were blunted (< 50%), consistent with elevated  central venous pressure. - Pericardium, extracardiac: There was a left pleural effusion.  Transthoracic echocardiography. M-mode, complete 2D, spectral Doppler, and color Doppler. Birthdate: Patient birthdate: 25-Sep-1950. Age: Patient is 65 yr old. Sex: Gender: male. BMI: 27.8 kg/m^2. Blood pressure: 132/102 Patient status: Inpatient. Study date: Study date: 09/09/2015. Study time: 02:05 PM. Location: ICU/CCU  -------------------------------------------------------------------  ------------------------------------------------------------------- Left ventricle: The cavity size was normal. There was moderate focal basal hypertrophy of the septum with mild posterior wall hypertrophy. Systolic function was normal. The estimated ejection fraction was in the range of 50% to 55%. Wall motion was normal; there were no regional wall motion abnormalities. The study was not technically sufficient to allow evaluation of LV diastolic dysfunction due to atrial  fibrillation.  ------------------------------------------------------------------- Aortic valve: Trileaflet; normal thickness leaflets. Mobility was not restricted. Doppler: Transvalvular velocity was within the normal range. There was no stenosis. There was no regurgitation.  ------------------------------------------------------------------- Aorta: Aortic root: The aortic root was normal in size.  ------------------------------------------------------------------- Mitral valve: Structurally normal valve. Mobility was not restricted. Doppler: Transvalvular velocity was within the normal range. There was no evidence for stenosis. There was moderate regurgitation directed posteriorly. Peak gradient (D): 8 mm Hg.  ------------------------------------------------------------------- Left atrium: The atrium was moderately dilated.  ------------------------------------------------------------------- Right ventricle: The cavity size was normal. Wall thickness was normal. Systolic function was normal.  ------------------------------------------------------------------- Pulmonic valve: Structurally normal valve. Cusp separation was normal. Doppler: Transvalvular velocity was within the normal range. There was no evidence for stenosis. There was no regurgitation.  ------------------------------------------------------------------- Tricuspid valve: Structurally normal valve. Doppler: Transvalvular velocity was within the normal range. There was mild-moderate regurgitation.  ------------------------------------------------------------------- Pulmonary artery: The main pulmonary artery was normal-sized. Systolic pressure was severely increased.  ------------------------------------------------------------------- Right atrium: The atrium was normal in size.  ------------------------------------------------------------------- Pericardium: There  was no pericardial effusion.  ------------------------------------------------------------------- Systemic veins: Inferior vena cava: The vessel was dilated. The respirophasic diameter changes were blunted (< 50%), consistent with elevated central venous pressure.  ------------------------------------------------------------------- Pleura: There was a left pleural effusion.  ------------------------------------------------------------------- Measurements  Left ventricle Value Reference LV ID, ED, PLAX chordal 43.4 mm 43 - 52 LV ID, ES, PLAX chordal 34.9 mm 23 - 38 LV fx shortening, PLAX chordal (L) 20 % >=29 LV PW thickness, ED 12.6 mm ---------  IVS/LV PW ratio, ED 1.25 <=1.3 LV ejection fraction, 1-p A4C 54 % --------- LV end-diastolic volume, 2-p [id number] ml --------- LV end-systolic volume, 2-p 50 ml --------- LV ejection fraction, 2-p 51 % --------- Stroke volume, 2-p 53 ml --------- LV end-diastolic volume/bsa, 2-p 50 ml/m^2 --------- LV end-systolic volume/bsa, 2-p 24 ml/m^2 --------- Stroke volume/bsa, 2-p 25.8 ml/m^2 ---------  Ventricular septum Value Reference IVS thickness, ED 15.7 mm ---------  LVOT Value Reference LVOT ID, S 21 mm --------- LVOT area 3.46 cm^2 ---------  Aorta Value Reference Aortic root ID, ED 35 mm ---------  Left  atrium Value Reference LA ID, A-P, ES 48 mm --------- LA ID/bsa, A-P (H) 2.34 cm/m^2 <=2.2 LA volume, S 76 ml --------- LA volume/bsa, S 37 ml/m^2 --------- LA volume, ES, 1-p A4C 69 ml --------- LA volume/bsa, ES, 1-p A4C 33.6 ml/m^2 --------- LA volume, ES, 1-p A2C 71 ml --------- LA volume/bsa, ES, 1-p A2C 34.6 ml/m^2 ---------  Mitral valve Value Reference Mitral E-wave peak velocity 139 cm/s --------- Mitral deceleration time (L) 123 ms 150 - 230 Mitral peak gradient, D 8 mm Hg --------- Mitral maximal regurg velocity, 502 cm/s --------- PISA Mitral regurg VTI, PISA 112 cm --------- Mitral ERO, PISA 0.37 cm^2 --------- Mitral regurg volume, PISA 41 ml ---------  Pulmonary arteries Value Reference PA pressure, S, DP (H) 67 mm Hg <=30  Tricuspid valve Value Reference Tricuspid regurg peak velocity 361 cm/s --------- Tricuspid peak RV-RA gradient 52 mm Hg ---------  Systemic veins Value Reference Estimated CVP 15 mm Hg ---------  Right ventricle Value Reference RV pressure, S, DP (H) 67 mm Hg <=30 RV s&', lateral, S 10.6 cm/s ---------  Legend: (L) and (H) mark values outside specified reference  range.  ------------------------------------------------------------------- Prepared and Electronically Authenticated by  Skeet Latch, MD 2017-05-02T15:14:53   Transesophageal Echocardiography  (Report amended )  Patient: Baltasar, Visalli MR #: [id number] Study Date: 10/30/2015 Gender: M Age: 50 Height: 177.8 cm Weight: 85.3 kg BSA: 2.07 m^2 Pt. Status: Room:  ATTENDING Adrian Prows, MD PERFORMING Adrian Prows, MD SONOGRAPHER Johny Chess, RDCS, CCT ADMITTING Farrel Gordon R  cc:  ------------------------------------------------------------------- LV EF: 50% - 55%  ------------------------------------------------------------------- Indications: Mitral regurgitation 424.0.  ------------------------------------------------------------------- Study Conclusions  - Left ventricle: Systolic function was normal. The estimated  ejection fraction was in the range of 50% to 55%. - Aortic valve: There was trivial regurgitation. - Aorta: There was mild atheromatous plaque in the descending  aorta. - Mitral valve: Mild thickening of the anterior leaflet, consistent  with myxomatous proliferation. Severe, holosystolicprolapse,  involving the anterior leaflet. There was malcoaptation of the  valve leaflets. There was severe regurgitation directed  posteriorly. - Left atrium: The atrium was dilated. - Right ventricle: The cavity size was mildly dilated. Systolic  function was reduced. - Atrial septum: No defect or patent foramen ovale was identified.  Impressions:  - Normal pulmonary artery pressure.  Diagnostic transesophageal echocardiography. 2D and color Doppler. Birthdate: Patient birthdate: 04/26/1951. Age: Patient is 65 yr old. Sex: Gender: male. BMI: 27 kg/m^2. Blood pressure: 136/81 Patient status: Outpatient. Study date: Study  date: 10/30/2015. Study time: 08:05 AM. Location: Endoscopy.  -------------------------------------------------------------------  ------------------------------------------------------------------- Left ventricle: Systolic function was normal. The estimated ejection fraction was in the range of 50% to 55%.  ------------------------------------------------------------------- Aortic valve: Structurally normal valve. Cusp separation was normal. Doppler: There was trivial regurgitation.  ------------------------------------------------------------------- Aorta: There was mild atheromatous plaque in the descending aorta.  ------------------------------------------------------------------- Mitral valve: Mildly thickened leaflets .  Mild thickening of the anterior leaflet, consistent with myxomatous proliferation. Severe, holosystolicprolapse, involving the anterior leaflet. There was malcoaptation of the valve leaflets. Doppler: There was severe regurgitation directed posteriorly.  ------------------------------------------------------------------- Left atrium: The atrium was dilated. The appendage was not visualized. S/P Ligation during CABG  ------------------------------------------------------------------- Atrial septum: No defect or patent foramen ovale was identified.  ------------------------------------------------------------------- Right ventricle: The cavity size was mildly dilated. Systolic function was reduced.  ------------------------------------------------------------------- Pulmonic valve: Structurally normal valve. Cusp separation was normal. Doppler: There was trivial regurgitation.  ------------------------------------------------------------------- Tricuspid valve: Structurally normal valve. Leaflet separation was normal. Doppler: There was mild  regurgitation.  ------------------------------------------------------------------- Right atrium: The atrium was normal in size. No evidence of thrombus in the atrial cavity or appendage.  ------------------------------------------------------------------- Pericardium: The pericardium was normal in appearance. There was no pericardial effusion.  ------------------------------------------------------------------- Measurements  Mitral valve Value Mitral annulus diameter, A-P 35 mm Mitral regurg VTI, PISA 129 cm Mitral ERO, PISA 0.52 cm^2 Mitral regurg volume, PISA 67 ml  Legend: (L) and (H) mark values outside specified reference range.  ------------------------------------------------------------------- Benard Rink, MD 2017-06-23T06:19:17    CARDIAC CATHETERIZATION  Procedures    Abdominal Aortogram   Renal Angiography   Right/Left Heart Cath and Coronary/Graft Angiography    Conclusion     Ost 1st Mrg lesion, 90% stenosed.  Mid Cx lesion, 50% stenosed.  Impression: LVEDP upper end of normal at 17 mmHg. Severe pulmonary hypertension, PA pressure 72/27 with a mean of 47 mmHg. Pulmonary wedge 23/28 with a mean of 24 mmHg. RV 81/3, EDP 13 mmHg. RA 16/16, mean 13 mmHg.  Cardiac output by Fick was 3.29, cardiac index 1.65 by Fick. PA saturation 44%, aortic saturation 88%. By thermodilution, cardiac output 6.41, cardiac index 3.21.  Coronary angiogram: Severe diffuse disease of the native vessels. Distal RCA subtotally occluded, SVG to RCA is widely patent.  Left circumflex diffusely diseased, mid 30-40% stenosis, small OM1, this is followed by a large OM 2 with a ostial 90% stenosis, distal OM 3 is occluded with faint bridging collaterals. SVG to OM 3 is occluded. LAD is diffusely diseased with very small diagonals. High-grade 80-90% stenosis in the proximal segment  followed by 50% stenosis at the origin of a moderate to large size diagonal 2 and LAD is occluded after the origin of D2. Distal LAD is supplied by LIMA which is patent.  Abdominal aortogram: Tortuous abdominal aorta especially suprarenal. Mild calcification. No abdominal aortic aneurysm. Right renal artery inferior pole is a dominant vessel, has a high-grade 80% stenosis. Left renal artery has 30-40% stenosis.     Indications    Mitral regurgitation [I34.0 (ICD-10-CM)]   Renal artery stenosis (HCC) [I70.1 (ICD-10-CM)]    Technique and Indications    Procedures performed: Right and left heart catheterization and calculation of cardiac output and cardiac index by Fick. Abdominal aortogram, selective right and left renal arteriogram. Right femoral arterial 5 French access and left femoral 7 French vein access was utilized for performing the procedure.   Indication: Amner Knoedler is a 65 y.o. male with hyperlipidemia, hypertension, stage 2-3 CKD, Patient with known coronary artery disease and CABG status post Maze procedure and left atrial appendage clipping on 05/29/2015 with LIMA to LAD, SVG to M2, SVG to RCA, who is been having acute recurrent diastolic heart failure, was found to have severe mitral regurgitation by recent TEE. He is now scheduled for cardiac catheterization, that includes left and right to reevaluate coronary anatomy, patency of grafts and also to  evaluate cardiac output and PA pressures. Abdominal aortogram and selective renal arteriogram was performed because of recent CT angiogram of the chest revealing tortuous aorta, bilateral renal artery stenosis. Wanted to confirm severity of stenosis as this may impart significance during mitral valve repair.  A 6.5 French Swan-Ganz catheter was advanced with balloon inflated on the sheath under fluoroscopic guidance into first the right atrium followed by the right ventricle and into the pulmonary artery to pulmonary artery wedge  position. Hemodynamics were obtained in a locations. A 0.025 inch J-wire was utilized to obtain wedge position. After hemodynamics were completed, samples were taken for SaO2% measurement to be used in Fick cardiac output and cardiac index cannulation, thermodilution was also performed. The catheter was then pulled back the balloon down and then completely out of the body.   Left Heart Catheterization   Using safety J-wire, a 69F MP B2 catheter was advanced over standard J-wire into the ascending aorta and used to engage first the Left and saphenous vein graft. Catheter exchanged to a JR4 5 French catheter, right coronary artery was selected and cannulated and angiography was performed. Same catheter was utilized to engage the LIMA. The catheter was then pulled back into the abdominal aorta with a J-wire and abdominal aortogram followed by selective right and left renal artery gram was performed. MP B2 catheter which was used to cross the aortic valve for measurement of Left Ventricular Hemodynamics.Hemostasis was achieved by applying manual pressure..  Conscious sedation protocol was followed, I personally administered conscious sedation and monitored the patient. Patient received 2 mg milligrams of Versed and 50 g fentanyl . Patient tolerated the procedure well and there was no complication from conscious sedation. Time administered was 42 minutes. 65 mL contrast utilized.Estimated blood loss <50 mL. There were no immediate complications during the procedure.    Coronary Findings    Dominance: Right   Left Anterior Descending   . Prox LAD to Mid LAD lesion, 90% stenosed. Diffuse.   . Mid LAD lesion, 100% stenosed. Calcified.   . First Diagonal Branch   . 1st Diag lesion, 95% stenosed. Discrete.   . Third Septal Branch   Ost 3rd Sept filled by collaterals from Acute Mrg.     Left Circumflex   . Mid Cx lesion, 50% stenosed.   . First Obtuse Marginal Branch   The vessel is small in size.  OM2   . Ost 1st Mrg lesion, 90% stenosed.   . Third Obtuse Marginal Branch   . 3rd Mrg lesion, 99% stenosed. Diffuse.     Right Coronary Artery   . Mid RCA lesion, 70% stenosed. Calcified discrete.   . Mid RCA to Dist RCA lesion, 99% stenosed. Moderately Calcified discrete.   . Acute Marginal Branch   . Acute Mrg lesion, 95% stenosed. Discrete.     Graft Angiography    Free Graft to RPDA  SVG is normal in caliber.     Free Graft to 3rd Mrg  SVG   . Origin to Prox Graft lesion, 100% stenosed.     Free LIMA Graft to Dist LAD  LIMA is normal in caliber.           Right Heart Pressures RA 16/16, mean 13 mmHg. RV 81/3, EDP 13 mmHg. PA 72/27, mean 42 mmHg. Wedge 23/28, mean 24 mmHg. Cardiac output by Fick 3.29, cardiac index 1.65. By thermodilution cardiac output 6.41, cardiac index 3.21.  Findings consistent with moderate to severe pulmonary hypertension with elevated LVEDP and  pulmonary Very wedge. Increase cardiac output due to mitral regurgitation. Giant V wave evident on wedge tracing.    Coronary Diagrams    Diagnostic Diagram            Implants     No implant documentation for this case.    PACS Images    Show images for Cardiac catheterization     Link to Procedure Log    Procedure Log      Hemo Data       Most Recent Value   Fick Cardiac Output  3.29 L/min   Fick Cardiac Output Index  1.65 (L/min)/BSA   Thermal Cardiac Output  6.41 L/min   Thermal Cardiac Output Index  3.21 (L/min)/BSA   RA A Wave  16 mmHg   RA V Wave  16 mmHg   RA Mean  13 mmHg   RV Systolic Pressure  81 mmHg   RV Diastolic Pressure  3 mmHg   RV EDP  13 mmHg   PA Systolic Pressure  72 mmHg   PA Diastolic Pressure  27 mmHg   PA Mean  42 mmHg   PW A Wave  23 mmHg   PW V Wave  28 mmHg   PW Mean  24 mmHg   AO Systolic Pressure  123456 mmHg   AO Diastolic Pressure  88 mmHg   AO Mean  [id number] mmHg   LV Systolic Pressure  [id number] mmHg   LV Diastolic  Pressure  7 mmHg   LV EDP  16 mmHg   TPVR Index  13.1 HRUI   TSVR Index  31.82 HRUI   PVR SVR Ratio  0.2   TPVR/TSVR Ratio  0.41    CT ANGIOGRAPHY CHEST, ABDOMEN, PELVIS WITH CONTRAST  TECHNIQUE: Multidetector CT imaging of the chest, abdomen, pelvis was performed using the standard protocol during bolus administration of intravenous contrast. Multiplanar CT image reconstructions and MIPs were obtained to evaluate the vascular anatomy.  CONTRAST: 75 mL Isovue 370 IV  COMPARISON: CTA chest 05/13/2011, abdomen 04/07/2004  FINDINGS: CHEST  Vascular: Right arm IV contrast administration. The SVC is patent. There is additional filling of some body wall collaterals into the azygos venous system. Right atrium, right ventricle, and pulmonary artery branches are incompletely opacified; the exam was not optimized for detection of pulmonary emboli. Patent bilateral pulmonary veins drain into the left atrium which is mildly enlarged. Previous CABG. Adequate contrast opacification of the thoracic aorta with no evidence of dissection, aneurysm, or stenosis. There is classic 3-vessel brachiocephalic arch anatomy without proximal stenosis. Scattered plaque in the arch and descending thoracic aorta without significant intramural thrombus. Moderate tortuosity of the distal descending segment.  Mediastinum/Lymph Nodes: Subcentimeter prevascular, AP window, and precarinal lymph nodes. No hilar adenopathy. No pericardial effusion.  Lungs/Pleura: Moderate right and smaller left pleural effusions, increased since previous exam. Dependent subsegmental atelectasis posteriorly in both lower lobes. Lungs otherwise clear. No pneumothorax.  Musculoskeletal: Previous median sternotomy. Anterior vertebral endplate spurring at multiple levels in the mid and lower thoracic spine. Spondylitic changes in the visualized lower cervical spine.  ABDOMEN  Arterial  Aorta: Moderate  calcified plaque with some mild nonocclusive mural thrombus in the infrarenal segment. No aneurysm, dissection, or stenosis.  Celiac axis: Mild short-segment narrowing at the level of the median arcuate ligament of the diaphragm, patent distally.  Superior mesenteric: Partially calcified nonocclusive ostial plaque, patent distally with classic distal branch anatomy.  Left renal: Duplicated. The superior is diminutive, and supplies a portion  of the lower pole anterior division. The inferior is dominant, with partially calcified ostial plaque extending over length of at least 12 mm, resulting in stenosis of at least mild severity, patent distally.  Right renal: Single, with Irregular partially calcified ostial plaque extending over length of at least 14 mm, resulting in at least moderate stenosis, patent distally.  Inferior mesenteric: Patent  Left iliac: Eccentric partially calcified plaque in the distal common iliac without stenosis. Irregular plaque in the internal iliac which is ectatic up to a diameter of 12 mm, with some eccentric mural thrombus in the ectatic segment. There is mild tortuosity of the external iliac which is normal in caliber without significant atheromatous change. No dissection or stenosis.  Right iliac: Scattered calcified plaque through the common iliac and in the proximal internal iliac artery. Mild tortuosity of the external iliac without aneurysm, dissection, or stenosis.  Venous  Dedicated venous phase imaging not obtained. Note made of patent splenic and portal veins. Left pelvic phleboliths.  Review of the MIP images confirms the above findings.  Nonvasular  Hepatobiliary: No masses or other significant liver abnormality. Several subcentimeter partially calcified stones layer in the dependent aspect of the nondistended gallbladder. No intra or extrahepatic biliary ductal dilatation.  Pancreas: No mass, inflammatory changes, or  other significant abnormality.  Spleen: Within normal limits in size and appearance.  Adrenals/Urinary Tract: No masses identified. No evidence of hydronephrosis.  Stomach/Bowel: No evidence of obstruction, inflammatory process, or abnormal fluid collections. Innumerable descending and sigmoid diverticula without significant adjacent inflammatory/edematous change.  Lymphatic: No pathologically enlarged lymph nodes.  Reproductive: Moderate prostatic enlargement with central coarse calcification.  Other: There is a small amount of ascites in the right pericolic gutter, pelvis, and perihepatic. No free air.  Musculoskeletal: Moderate narrowing of the L2-3 interspace. Grade 1 anterolisthesis L4-5 probably secondary to advanced facet DJD at this level. Negative for fracture. Mild spurring in bilateral hips.  IMPRESSION: 1. Moderate tortuosity of the distal descending thoracic aorta and mild tortuosity of the iliac arterial systems bilaterally. 2. Mild scattered nonocclusive mural thrombus in the infrarenal abdominal aorta. 3. No significant aortoiliac occlusive disease. 4. Progression of moderate right and smaller left pleural effusions. 5. Small amount of abdominal ascites. 6. Bilateral ostial renal artery stenosis, right greater than left. 7. Descending and sigmoid diverticulosis. 8. Cholelithiasis   Electronically Signed  By: Lucrezia Europe M.D.  On: 11/12/2015 15:44    Impression:  Patient has relatively acute onset of stage D severe symptomatic mitral regurgitation. I have personally reviewed the patient's recent transesophageal echocardiogram and compared it with the transesophageal echocardiogram performed at the time of his surgery last January. There has been a dramatic change in the appearance of his mitral valve during the interim period of time. Last January the patient had mild (1+) central mitral regurgitation with normal leaflet mobility (type I  dysfunction). There was some annular dilatation present at that time. In contrast, recent transesophageal echocardiogram reveals severe (4+) mitral regurgitation with an eccentric jet that is directed posteriorly. The anterior leaflet appears to be overriding the posterior leaflet to some degree and there may be some myxomatous change and prolapse although there are no obvious ruptured chordae tendonae. The posterior leaflet does not appear to be significantly restricted and the functional anatomy is not suggestive of underlying ischemic etiology . In addition, the patient is currently in sinus rhythm and left ventricular function appears essentially normal with no significant wall motion abnormality.  It is unclear why the patient's  mitral regurgitation has gotten so much worse.  However, I agree the patient needs surgical intervention. Follow-up diagnostic catheterization demonstrates occlusion of the vein graft to the second obtuse marginal branch of the left circumflex coronary artery. There remains continued patency of the left internal mammary artery to the distal left anterior descending coronary artery and the saphenous vein graft to the distal right coronary artery. The second obtuse marginal branch of the left circumflex coronary artery was notably diffusely diseased at the time of his original surgery. The patient is not having signs or symptoms of ischemia. I do not feel that redo coronary bypass grafting should be attempted. Moreover, the risks associated with redo surgery may be mitigated using a right mini thoracotomy approach for mitral valve repair/replacement.   Plan:  The patient and his wife were counseled at length regarding the indications, risks and potential benefits of mitral valve repair.  We directly reviewed the patient's recent transesophageal echocardiogram and compared it with the transesophageal echocardiogram performed at the time of his surgery last January. We also reviewed  his recent catheterization and compared it with the diagnostic catheterization performed 6 months ago. The rationale for elective surgery has been explained, including a comparison between surgery and continued medical therapy with close follow-up.  Options for management of the patient's underlying coronary artery disease were discussed, as were reasons why I feel that redo coronary artery bypass grafting is not indicated at this time. The likelihood of successful and durable mitral valve repair has been discussed with particular reference to the findings of their recent echocardiogram.  Based upon these findings and previous experience, I have quoted them a greater than 50 percent likelihood of successful valve repair.  In the unlikely event that their valve cannot be successfully repaired, we discussed the possibility of replacing the mitral valve using a mechanical prosthesis with the attendant need for long-term anticoagulation versus the alternative of replacing it using a bioprosthetic tissue valve with its potential for late structural valve deterioration and failure, depending upon the patient's longevity.  The patient specifically requests that if the mitral valve must be replaced that it be done using a bioprosthetic tissue valve.   The patient understands and accepts all potential risks of surgery including but not limited to risk of death, stroke or other neurologic complication, myocardial infarction, congestive heart failure, respiratory failure, renal failure, bleeding requiring transfusion and/or reexploration, arrhythmia, infection or other wound complications, pneumonia, pleural and/or pericardial effusion, pulmonary embolus, aortic dissection or other major vascular complication, or delayed complications related to valve repair or replacement including but not limited to structural valve deterioration and failure, thrombosis, embolization, endocarditis, or paravalvular leak.  Alternative surgical  approaches have been discussed including a comparison between conventional sternotomy and minimally-invasive techniques.  The relative risks and benefits of each have been reviewed as they pertain to the patient's specific circumstances, and all of their questions have been addressed.  Specific risks potentially related to the minimally-invasive approach were discussed at length, including but not limited to risk of conversion to full or partial sternotomy, aortic dissection or other major vascular complication, unilateral acute lung injury or pulmonary edema, phrenic nerve dysfunction or paralysis, rib fracture, chronic pain, lung hernia, or lymphocele.   All of their questions have been answered.  We plan to proceed with surgery on Friday, 11/21/2015.   I spent in excess of 90 minutes during the conduct of this office consultation and >50% of this time involved direct face-to-face encounter with the patient  for counseling and/or coordination of their care.   Valentina Gu. Roxy Manns, MD 11/19/2015 3:45 PM

## 2015-11-21 NOTE — Addendum Note (Signed)
Addendum  created 11/21/15 1823 by Roberts Gaudy, MD   Modules edited: Notes Section   Notes Section:  File: [id number]; Pend: [id number]; Pend: [id number]; Raelyn Number: [id number]; Raelyn Number: [id number]; Raelyn Number: [id number]; Raelyn Number: [id number] Number: [id number]; Pend: [id number]

## 2015-11-22 ENCOUNTER — Inpatient Hospital Stay (HOSPITAL_COMMUNITY): Payer: Medicare Other

## 2015-11-22 LAB — GLUCOSE, CAPILLARY
GLUCOSE-CAPILLARY: 102 mg/dL — AB (ref 65–99)
GLUCOSE-CAPILLARY: 102 mg/dL — AB (ref 65–99)
GLUCOSE-CAPILLARY: 105 mg/dL — AB (ref 65–99)
GLUCOSE-CAPILLARY: 106 mg/dL — AB (ref 65–99)
GLUCOSE-CAPILLARY: 112 mg/dL — AB (ref 65–99)
GLUCOSE-CAPILLARY: 132 mg/dL — AB (ref 65–99)
GLUCOSE-CAPILLARY: 92 mg/dL (ref 65–99)
GLUCOSE-CAPILLARY: 93 mg/dL (ref 65–99)
GLUCOSE-CAPILLARY: 97 mg/dL (ref 65–99)
Glucose-Capillary: 97 mg/dL (ref 65–99)
Glucose-Capillary: 132 mg/dL — ABNORMAL HIGH (ref 65–99)
Glucose-Capillary: 106 mg/dL — ABNORMAL HIGH (ref 65–99)
Glucose-Capillary: 86 mg/dL (ref 65–99)

## 2015-11-22 LAB — CBC
HEMATOCRIT: 32.8 % — AB (ref 39.0–52.0)
HEMATOCRIT: 36.8 % — AB (ref 39.0–52.0)
Hemoglobin: 10.5 g/dL — ABNORMAL LOW (ref 13.0–17.0)
Hemoglobin: 11.8 g/dL — ABNORMAL LOW (ref 13.0–17.0)
MCH: 30.2 pg (ref 26.0–34.0)
MCH: 30.3 pg (ref 26.0–34.0)
MCHC: 32 g/dL (ref 30.0–36.0)
MCHC: 32.1 g/dL (ref 30.0–36.0)
MCV: 94.3 fL (ref 78.0–100.0)
MCV: 94.6 fL (ref 78.0–100.0)
Platelets: 84 10*3/uL — ABNORMAL LOW (ref 150–400)
Platelets: 72 10*3/uL — ABNORMAL LOW (ref 150–400)
RBC: 3.48 MIL/uL — ABNORMAL LOW (ref 4.22–5.81)
RBC: 3.89 MIL/uL — ABNORMAL LOW (ref 4.22–5.81)
RDW: 14.9 % (ref 11.5–15.5)
RDW: 15 % (ref 11.5–15.5)
WBC: 14.7 10*3/uL — ABNORMAL HIGH (ref 4.0–10.5)
WBC: 17.3 10*3/uL — ABNORMAL HIGH (ref 4.0–10.5)

## 2015-11-22 LAB — POCT I-STAT, CHEM 8
BUN: 13 mg/dL (ref 6–20)
Calcium, Ion: 1.1 mmol/L — ABNORMAL LOW (ref 1.12–1.23)
Chloride: 111 mmol/L (ref 101–111)
Creatinine, Ser: 1.1 mg/dL (ref 0.61–1.24)
GLUCOSE: 129 mg/dL — AB (ref 65–99)
HCT: 37 % — ABNORMAL LOW (ref 39.0–52.0)
HEMOGLOBIN: 12.6 g/dL — AB (ref 13.0–17.0)
POTASSIUM: 4 mmol/L (ref 3.5–5.1)
Sodium: 135 mmol/L (ref 135–145)
TCO2: 26 mmol/L (ref 0–100)

## 2015-11-22 LAB — CREATININE, SERUM
Creatinine, Ser: 1.18 mg/dL (ref 0.61–1.24)
GFR calc Af Amer: >60 mL/min (ref >60)
GFR calc non Af Amer: >60 mL/min (ref >60)

## 2015-11-22 LAB — BASIC METABOLIC PANEL
Anion gap: 5 (ref 5–15)
BUN: 15 mg/dL (ref 6–20)
CALCIUM: 7.7 mg/dL — AB (ref 8.9–10.3)
CO2: 24 mmol/L (ref 22–32)
CREATININE: 1.08 mg/dL (ref 0.61–1.24)
Chloride: 111 mmol/L (ref 101–111)
GFR calc non Af Amer: >60 mL/min (ref >60)
GLUCOSE: 97 mg/dL (ref 65–99)
Potassium: 4.2 mmol/L (ref 3.5–5.1)
Sodium: 140 mmol/L (ref 135–145)

## 2015-11-22 LAB — PREPARE FRESH FROZEN PLASMA
UNIT DIVISION: 0
Unit division: 0

## 2015-11-22 LAB — PREPARE PLATELET PHERESIS: UNIT DIVISION: 0

## 2015-11-22 LAB — MAGNESIUM
MAGNESIUM: 2.1 mg/dL (ref 1.7–2.4)
Magnesium: 2.1 mg/dL (ref 1.7–2.4)

## 2015-11-22 MED ORDER — FUROSEMIDE 10 MG/ML IJ SOLN
20.0000 mg | Freq: Four times a day (QID) | INTRAMUSCULAR | Status: AC
  Administered 2015-11-22 (×3): 20 mg via INTRAVENOUS
  Filled 2015-11-22 (×3): qty 2

## 2015-11-22 MED ORDER — DIAZEPAM 5 MG PO TABS
10.0000 mg | ORAL_TABLET | Freq: Four times a day (QID) | ORAL | Status: DC | PRN
  Administered 2015-11-22 – 2015-11-26 (×5): 10 mg via ORAL
  Filled 2015-11-22 (×6): qty 2

## 2015-11-22 MED ORDER — MORPHINE SULFATE (PF) 2 MG/ML IV SOLN
1.0000 mg | INTRAVENOUS | Status: DC | PRN
  Administered 2015-11-22 – 2015-11-28 (×10): 2 mg via INTRAVENOUS
  Filled 2015-11-22 (×10): qty 1

## 2015-11-22 MED ORDER — INSULIN ASPART 100 UNIT/ML ~~LOC~~ SOLN: 0.0000 [IU] | SUBCUTANEOUS | Status: DC

## 2015-11-22 MED ORDER — LACTATED RINGERS IV SOLN: INTRAVENOUS | Status: DC

## 2015-11-22 NOTE — Progress Notes (Signed)
[address]       [place] [address]             [phone]        CARDIOTHORACIC SURGERY PROGRESS NOTE   R1 Day Post-Op Procedure(s) (LRB): MINIMALLY INVASIVE REOPERATION FOR MITRAL VALVE REPAIR (Right) TRANSESOPHAGEAL ECHOCARDIOGRAM (TEE) (N/A) LEFT CHEST TUBE INSERTION  Subjective: Looks good.  Mild soreness in chest.  Breathing comfortably, already better than PTA.  No nausea.  Objective: Vital signs: BP Readings from Last 1 Encounters:  11/22/15 120/82   Pulse Readings from Last 1 Encounters:  11/22/15 80   Resp Readings from Last 1 Encounters:  11/22/15 16   Temp Readings from Last 1 Encounters:  11/22/15 98.1 F (36.7 C)     Hemodynamics: PAP: (42-66)/(16-34) 54/32 mmHg CO:  [5.4 L/min-7.8 L/min] 5.9 L/min CI:  [2.7 L/min/m2-3.9 L/min/m2] 3 L/min/m2  Physical Exam:  Rhythm:   sinus  Breath sounds: clear  Heart sounds:  RRR w/out murmur  Incisions:  Dressings dry, intact  Abdomen:  Soft, non-distended, non-tender  Extremities:  Warm, well-perfused  Chest tubes:  Decreasing volume thin serosanguinous output, no air leak    Intake/Output from previous day: 07/14 0701 - 07/15 0700 In: 10683.9 [P.O.:240; I.V.:6148.9; Blood:2535; NG/GT:60; IV Piggyback:1700] Out: [id number] [Urine:2205; Blood:2250; Chest Tube:1350] Intake/Output this shift: Total I/O In: -  Out: 75 [Urine:75]  Lab Results:  CBC: Recent Labs  11/21/15 2200 11/22/15 0300  WBC 19.5* 14.7*  HGB 11.5* 10.5*  HCT 35.9* 32.8*  PLT 93* 84*    BMET:  Recent Labs  11/19/15 1340  11/21/15 2151 11/21/15 2200 11/22/15 0300  NA 138  < > 142  --  140  K 4.3  < > 4.0  --  4.2  CL 108  < > 105  --  111  CO2 21*  --   --   --  24  GLUCOSE 102*  < > 140*  --  97  BUN 25*  < > 17  --  15  CREATININE 1.36*  < > 1.20 1.30* 1.08  CALCIUM 9.1  --   --   --  7.7*  < > = values in this interval not displayed.   PT/INR:   Recent Labs  11/21/15 1525  LABPROT 23.0*    INR 2.05*    CBG (last 3)   Recent Labs  11/22/15 0154 11/22/15 0253 11/22/15 0353  GLUCAP 106* 97 92    ABG    Component Value Date/Time   PHART 7.350 11/21/2015 2148   PCO2ART 34.3* 11/21/2015 2148   PO2ART 107.0* 11/21/2015 2148   HCO3 19.0* 11/21/2015 2148   TCO2 22 11/21/2015 2151   ACIDBASEDEF 6.0* 11/21/2015 2148   O2SAT 98.0 11/21/2015 2148    CXR: Clear.  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

## 2015-11-22 NOTE — Plan of Care (Signed)
Problem: Respiratory: Goal: Ability to tolerate decreased levels of ventilator support will improve Outcome: Completed/Met Date Met:  11/22/15 Pt able to wean to extubation after 7 hr to nasal cannula. Weaning delayed d/t acidosis immediately post-op requiring IMV rate increase and sodium bicarb amps.Post extubation, Pt able to perform C/DB exercises and dangle at bedside within 2 hrs of extubation. Will continue to monitor and support Pt as needed.  Problem: Pain Management: Goal: Pain level will decrease Outcome: Progressing Pt rates pain level at 3/10 and tolerable. Able to C/DB and reposition without significant change in score. Will continue to support and monitor.

## 2015-11-22 NOTE — Progress Notes (Signed)
TCTS BRIEF SICU PROGRESS NOTE  1 Day Post-Op  S/P Procedure(s) (LRB): MINIMALLY INVASIVE REOPERATION FOR MITRAL VALVE REPAIR (Right) TRANSESOPHAGEAL ECHOCARDIOGRAM (TEE) (N/A) LEFT CHEST TUBE INSERTION   Stable day Maintaining AAI paced rhythm w/ stable BP Breathing comfortably w/ O2 sats 98-100% UOP excellent Labs okay  Plan: Continue current plan  Rexene Alberts, MD 11/22/2015 6:32 PM

## 2015-11-23 ENCOUNTER — Inpatient Hospital Stay (HOSPITAL_COMMUNITY): Payer: Medicare Other

## 2015-11-23 LAB — CBC
HCT: 33.5 % — ABNORMAL LOW (ref 39.0–52.0)
HEMOGLOBIN: 10.7 g/dL — AB (ref 13.0–17.0)
MCH: 30.2 pg (ref 26.0–34.0)
MCHC: 31.9 g/dL (ref 30.0–36.0)
MCV: 94.6 fL (ref 78.0–100.0)
Platelets: 58 10*3/uL — ABNORMAL LOW (ref 150–400)
RBC: 3.54 MIL/uL — AB (ref 4.22–5.81)
RDW: 14.9 % (ref 11.5–15.5)
WBC: 13.2 10*3/uL — ABNORMAL HIGH (ref 4.0–10.5)

## 2015-11-23 LAB — BASIC METABOLIC PANEL
ANION GAP: 4 — AB (ref 5–15)
BUN: 13 mg/dL (ref 6–20)
CHLORIDE: 105 mmol/L (ref 101–111)
CO2: 28 mmol/L (ref 22–32)
Calcium: 7.9 mg/dL — ABNORMAL LOW (ref 8.9–10.3)
Creatinine, Ser: 1.13 mg/dL (ref 0.61–1.24)
GFR calc non Af Amer: >60 mL/min (ref >60)
Glucose, Bld: 111 mg/dL — ABNORMAL HIGH (ref 65–99)
POTASSIUM: 3.6 mmol/L (ref 3.5–5.1)
Sodium: 137 mmol/L (ref 135–145)

## 2015-11-23 LAB — GLUCOSE, CAPILLARY
GLUCOSE-CAPILLARY: 94 mg/dL (ref 65–99)
GLUCOSE-CAPILLARY: 105 mg/dL — AB (ref 65–99)

## 2015-11-23 MED ORDER — SODIUM CHLORIDE 0.9% FLUSH: 3.0000 mL | INTRAVENOUS | Status: DC | PRN

## 2015-11-23 MED ORDER — MOVING RIGHT ALONG BOOK
Freq: Once | Status: AC
  Administered 2015-11-23: 19:00:00
  Filled 2015-11-23: qty 1

## 2015-11-23 MED ORDER — ZOLPIDEM TARTRATE 5 MG PO TABS
5.0000 mg | ORAL_TABLET | Freq: Every evening | ORAL | Status: DC | PRN
  Administered 2015-11-24 – 2015-11-30 (×6): 5 mg via ORAL
  Filled 2015-11-23 (×6): qty 1

## 2015-11-23 MED ORDER — SODIUM CHLORIDE 0.9 % IV SOLN: 250.0000 mL | INTRAVENOUS | Status: DC | PRN

## 2015-11-23 MED ORDER — POTASSIUM CHLORIDE 10 MEQ/50ML IV SOLN
10.0000 meq | INTRAVENOUS | Status: AC
  Administered 2015-11-23 (×2): 10 meq via INTRAVENOUS

## 2015-11-23 MED ORDER — FUROSEMIDE 10 MG/ML IJ SOLN
40.0000 mg | Freq: Two times a day (BID) | INTRAMUSCULAR | Status: AC
  Administered 2015-11-23 (×2): 40 mg via INTRAVENOUS
  Filled 2015-11-23 (×2): qty 4

## 2015-11-23 MED ORDER — FUROSEMIDE 40 MG PO TABS
40.0000 mg | ORAL_TABLET | Freq: Two times a day (BID) | ORAL | Status: DC
  Filled 2015-11-23: qty 1

## 2015-11-23 MED ORDER — POTASSIUM CHLORIDE 10 MEQ/50ML IV SOLN
10.0000 meq | INTRAVENOUS | Status: AC
  Administered 2015-11-23 (×2): 10 meq via INTRAVENOUS
  Filled 2015-11-23 (×2): qty 50

## 2015-11-23 MED ORDER — POTASSIUM CHLORIDE CRYS ER 20 MEQ PO TBCR
20.0000 meq | EXTENDED_RELEASE_TABLET | Freq: Two times a day (BID) | ORAL | Status: DC
  Administered 2015-11-24 – 2015-12-01 (×15): 20 meq via ORAL
  Filled 2015-11-23 (×15): qty 1

## 2015-11-23 MED ORDER — METOPROLOL SUCCINATE ER 25 MG PO TB24
25.0000 mg | ORAL_TABLET | Freq: Every day | ORAL | Status: DC
  Administered 2015-11-24 – 2015-11-25 (×2): 25 mg via ORAL
  Filled 2015-11-23 (×2): qty 1

## 2015-11-23 MED ORDER — SODIUM CHLORIDE 0.9% FLUSH
3.0000 mL | Freq: Two times a day (BID) | INTRAVENOUS | Status: DC
  Administered 2015-11-24 – 2015-12-01 (×14): 3 mL via INTRAVENOUS

## 2015-11-23 NOTE — Progress Notes (Signed)
[address]       [place] [address]             [phone]        CARDIOTHORACIC SURGERY PROGRESS NOTE   R2 Days Post-Op Procedure(s) (LRB): MINIMALLY INVASIVE REOPERATION FOR MITRAL VALVE REPAIR (Right) TRANSESOPHAGEAL ECHOCARDIOGRAM (TEE) (N/A) LEFT CHEST TUBE INSERTION  Subjective: Complains of pain from A-line and foley.  Otherwise feels okay.  No SOB.  Eating breakfast.  Objective: Vital signs: BP Readings from Last 1 Encounters:  11/23/15 123/68   Pulse Readings from Last 1 Encounters:  11/23/15 80   Resp Readings from Last 1 Encounters:  11/23/15 18   Temp Readings from Last 1 Encounters:  11/23/15 97.6 F (36.4 C) Oral    Hemodynamics: PAP: (53)/(32) 53/32 mmHg  Physical Exam:  Rhythm:   sinus  Breath sounds: clear  Heart sounds:  RRR w/out murmur  Incisions:  Dressings dry, intact  Abdomen:  Soft, non-distended, non-tender  Extremities:  Warm, well-perfused  Chest tubes:  Decreasing volume thin serosanguinous output, no air leak    Intake/Output from previous day: 07/15 0701 - 07/16 0700 In: 1830.8 [P.O.:840; I.V.:890.8; IV Piggyback:100] Out: [id number] [Urine:3365; Chest Tube:1140] Intake/Output this shift: Total I/O In: 50 [IV Piggyback:50] Out: 75 [Urine:25; Chest Tube:50]  Lab Results:  CBC: Recent Labs  11/22/15 1727 11/23/15 0355  WBC 17.3* 13.2*  HGB 11.8* 10.7*  HCT 36.8* 33.5*  PLT 72* 58*    BMET:  Recent Labs  11/22/15 0300 11/22/15 1718 11/22/15 1727 11/23/15 0355  NA 140 135  --  137  K 4.2 4.0  --  3.6  CL 111 111  --  105  CO2 24  --   --  28  GLUCOSE 97 129*  --  111*  BUN 15 13  --  13  CREATININE 1.08 1.10 1.18 1.13  CALCIUM 7.7*  --   --  7.9*     PT/INR:   Recent Labs  11/21/15 1525  LABPROT 23.0*  INR 2.05*    CBG (last 3)   Recent Labs  11/22/15 1914 11/22/15 2306 11/23/15 0401  GLUCAP 102* 112* 105*    ABG    Component Value Date/Time   PHART 7.350 11/21/2015  2148   PCO2ART 34.3* 11/21/2015 2148   PO2ART 107.0* 11/21/2015 2148   HCO3 19.0* 11/21/2015 2148   TCO2 26 11/22/2015 1718   ACIDBASEDEF 6.0* 11/21/2015 2148   O2SAT 98.0 11/21/2015 2148    CXR: PORTABLE CHEST 1 VIEW  COMPARISON: 11/22/2015  FINDINGS: There is moderate cardiac enlargement. Status post median sternotomy and CABG procedure. Interval removal of Swan-Ganz catheter. Right-sided chest tubes are in place. The left chest tube has been removed. No pneumothorax identified. Similar appearance of bibasilar atelectasis.  IMPRESSION: 1. Stable exam. Right-sided chest tubes are in place without pneumothorax.   Electronically Signed  By: Kerby Moors M.D.  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

## 2015-11-23 NOTE — Progress Notes (Signed)
Pt with repeated complaints of not being able to urinate even when bearing down. Explanations provided that it was not necessary to bear down for the foley cath to drain the bladder. Pt continued without exhibiting understanding. Lasix 20 mg IV ordered at 2200 with minimal output noted afterwards and Pt continued complaint of not being able to empty the bladder, it was decided to perform a bladder scan which revealed > 600 cc of urine in the bladder. The foley catheter was cleansed with betadine and lubricated, the bulb was deflated and foley advanced to injection port with immediate outflow of urine. The bulb was re inflated with 10 cc of saline and foley secured. Reassurances provided to Pt and  reinforced that the foley drains without having to bear down. Shortly there after 600 cc of urine emptied from urometer and Pt stated great relief from procedure. Will continue to monitor and assess.

## 2015-11-23 NOTE — Progress Notes (Signed)
K+= 3.6 and creat= 1.13  w/ urine o/p > 30cc/hr; TCTS KCL protocol initiated with 10 mEq KCL in 50cc IV x 3, each over one hour. 

## 2015-11-24 ENCOUNTER — Inpatient Hospital Stay (HOSPITAL_COMMUNITY): Payer: Medicare Other

## 2015-11-24 ENCOUNTER — Encounter (HOSPITAL_COMMUNITY): Payer: Self-pay | Admitting: Thoracic Surgery (Cardiothoracic Vascular Surgery)

## 2015-11-24 LAB — CBC
HEMATOCRIT: 36.8 % — AB (ref 39.0–52.0)
Hemoglobin: 11.9 g/dL — ABNORMAL LOW (ref 13.0–17.0)
MCH: 30.6 pg (ref 26.0–34.0)
MCHC: 32.3 g/dL (ref 30.0–36.0)
MCV: 94.6 fL (ref 78.0–100.0)
Platelets: 58 10*3/uL — ABNORMAL LOW (ref 150–400)
RBC: 3.89 MIL/uL — ABNORMAL LOW (ref 4.22–5.81)
RDW: 15 % (ref 11.5–15.5)
WBC: 12.7 10*3/uL — AB (ref 4.0–10.5)

## 2015-11-24 LAB — BASIC METABOLIC PANEL
ANION GAP: 4 — AB (ref 5–15)
BUN: 15 mg/dL (ref 6–20)
CALCIUM: 8 mg/dL — AB (ref 8.9–10.3)
CO2: 30 mmol/L (ref 22–32)
Chloride: 102 mmol/L (ref 101–111)
Creatinine, Ser: 1.21 mg/dL (ref 0.61–1.24)
GFR calc Af Amer: >60 mL/min (ref >60)
GFR calc non Af Amer: >60 mL/min (ref >60)
GLUCOSE: 96 mg/dL (ref 65–99)
Potassium: 3.7 mmol/L (ref 3.5–5.1)
Sodium: 136 mmol/L (ref 135–145)

## 2015-11-24 MED ORDER — FUROSEMIDE 40 MG PO TABS
40.0000 mg | ORAL_TABLET | Freq: Two times a day (BID) | ORAL | Status: DC
  Administered 2015-11-25 – 2015-11-29 (×9): 40 mg via ORAL
  Filled 2015-11-24 (×9): qty 1

## 2015-11-24 MED ORDER — ACETAMINOPHEN 160 MG/5ML PO SOLN
1000.0000 mg | Freq: Four times a day (QID) | ORAL | Status: AC
  Administered 2015-11-24 – 2015-11-27 (×9): 1000 mg via ORAL
  Filled 2015-11-24 (×9): qty 40.6

## 2015-11-24 MED ORDER — POTASSIUM CHLORIDE 10 MEQ/50ML IV SOLN
10.0000 meq | INTRAVENOUS | Status: AC
  Administered 2015-11-24 (×3): 10 meq via INTRAVENOUS
  Filled 2015-11-24 (×3): qty 50

## 2015-11-24 MED ORDER — WARFARIN SODIUM 5 MG PO TABS
5.0000 mg | ORAL_TABLET | Freq: Every day | ORAL | Status: DC
  Administered 2015-11-24 – 2015-11-25 (×2): 5 mg via ORAL
  Filled 2015-11-24 (×2): qty 1

## 2015-11-24 MED ORDER — WARFARIN - PHYSICIAN DOSING INPATIENT
Freq: Every day | Status: DC
  Administered 2015-11-24 – 2015-11-25 (×2)

## 2015-11-24 MED ORDER — FUROSEMIDE 10 MG/ML IJ SOLN
40.0000 mg | Freq: Two times a day (BID) | INTRAMUSCULAR | Status: AC
  Administered 2015-11-24: 40 mg via INTRAVENOUS
  Filled 2015-11-24 (×2): qty 4

## 2015-11-24 MED ORDER — POTASSIUM CHLORIDE 10 MEQ/50ML IV SOLN
10.0000 meq | INTRAVENOUS | Status: AC
  Administered 2015-11-24 (×2): 10 meq via INTRAVENOUS
  Filled 2015-11-24 (×2): qty 50

## 2015-11-24 MED FILL — Electrolyte-R (PH 7.4) Solution: INTRAVENOUS | Qty: 4000 | Status: AC

## 2015-11-24 MED FILL — Sodium Bicarbonate IV Soln 8.4%: INTRAVENOUS | Qty: 50 | Status: AC

## 2015-11-24 MED FILL — Albumin, Human Inj 5%: INTRAVENOUS | Qty: 250 | Status: AC

## 2015-11-24 MED FILL — Heparin Sodium (Porcine) Inj 1000 Unit/ML: INTRAMUSCULAR | Qty: 10 | Status: AC

## 2015-11-24 MED FILL — Mannitol IV Soln 20%: INTRAVENOUS | Qty: 500 | Status: AC

## 2015-11-24 MED FILL — Sodium Chloride IV Soln 0.9%: INTRAVENOUS | Qty: 4000 | Status: AC

## 2015-11-24 MED FILL — Heparin Sodium (Porcine) Inj 1000 Unit/ML: INTRAMUSCULAR | Qty: 30 | Status: AC

## 2015-11-24 NOTE — Progress Notes (Addendum)
[address]       [place] [address]             [phone]        CARDIOTHORACIC SURGERY PROGRESS NOTE   R3 Days Post-Op Procedure(s) (LRB): MINIMALLY INVASIVE REOPERATION FOR MITRAL VALVE REPAIR (Right) TRANSESOPHAGEAL ECHOCARDIOGRAM (TEE) (N/A) LEFT CHEST TUBE INSERTION  Subjective: Looks good.  Minimal pain.  No SOB  Objective: Vital signs: BP Readings from Last 1 Encounters:  11/24/15 106/71   Pulse Readings from Last 1 Encounters:  11/24/15 56   Resp Readings from Last 1 Encounters:  11/24/15 23   Temp Readings from Last 1 Encounters:  11/24/15 99 F (37.2 C) Oral    Hemodynamics:    Physical Exam:  Rhythm:   sinus  Breath sounds: clear  Heart sounds:  RRR w/out murmur  Incisions:  Clean and dry  Abdomen:  Soft, non-distended, non-tender  Extremities:  Warm, well-perfused  Chest tubes:  Decreasing but significant volume thin serosanguinous output, no air leak    Intake/Output from previous day: 07/16 0701 - 07/17 0700 In: 1580.8 [P.O.:600; I.V.:780.8; IV Piggyback:200] Out: 2370 [Urine:1675; Chest Tube:695] Intake/Output this shift: Total I/O In: -  Out: 100 [Chest Tube:100]  Lab Results:  CBC: Recent Labs  11/23/15 0355 11/24/15 0402  WBC 13.2* 12.7*  HGB 10.7* 11.9*  HCT 33.5* 36.8*  PLT 58* 58*    BMET:  Recent Labs  11/23/15 0355 11/24/15 0402  NA 137 136  K 3.6 3.7  CL 105 102  CO2 28 30  GLUCOSE 111* 96  BUN 13 15  CREATININE 1.13 1.21  CALCIUM 7.9* 8.0*     PT/INR:   Recent Labs  11/21/15 1525  LABPROT 23.0*  INR 2.05*    CBG (last 3)   Recent Labs  11/22/15 1914 11/22/15 2306 11/23/15 0401  GLUCAP 102* 112* 105*    ABG    Component Value Date/Time   PHART 7.350 11/21/2015 2148   PCO2ART 34.3* 11/21/2015 2148   PO2ART 107.0* 11/21/2015 2148   HCO3 19.0* 11/21/2015 2148   TCO2 26 11/22/2015 1718   ACIDBASEDEF 6.0* 11/21/2015 2148   O2SAT 98.0 11/21/2015 2148     CXR: PORTABLE CHEST 1 VIEW  COMPARISON: November 23, 2015  FINDINGS: Chest tube remains on the right. No pneumothorax. Soft tissue air on the right inferiorly and laterally is noted. Cordis tip is in the left innominate vein. Temporary pacemaker wires are attached to the right heart. There is persistent consolidation in the left lower lobe. Heart is enlarged with pulmonary vascularity within normal limits. 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 No adenopathy evident.  IMPRESSION: Tube and catheter positions as described without pneumothorax. Persistent airspace consolidation left lower lobe. No new opacity. Stable cardiomegaly.   Electronically Signed  By: Lowella Grip III M.D.  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

## 2015-11-24 NOTE — Plan of Care (Signed)
Problem: Bowel/Gastric: Goal: Gastrointestinal status for postoperative course will improve Outcome: Completed/Met Date Met:  11/24/15 Pt tolerating advancing diet to heart healthy and increasing appetite. BM x 2.  Problem: Respiratory: Goal: Levels of oxygenation will improve Outcome: Completed/Met Date Met:  11/24/15 Pt weaned to room air POD 2, O2 sats 98-100% with appropriate use of I.S. To 1250 cc with encouragement, strong productive cough and no s/s of dyspnea with exertion.  Goal: Respiratory status will improve Outcome: Completed/Met Date Met:  11/24/15 Pt weaned to room air POD 2, O2 sats 98-100% with appropriate use of I.S. To 1250 cc with encouragement, strong productive cough and no s/s of dyspnea with exertion.   Problem: Pain Management: Goal: Pain level will decrease Outcome: Completed/Met Date Met:  11/24/15 Pt tolerating expected post-op pain level with scheduled tylenol and minimal PRN analgesics needed.

## 2015-11-24 NOTE — Progress Notes (Signed)
   11/24/15 0950  Clinical Encounter Type  Visited With Patient  Visit Type Initial  Referral From Other (Comment)  Consult/Referral To Chaplain  Spiritual Encounters  Spiritual Needs Prayer;Emotional  Stress Factors  Patient Stress Factors Health changes;Loss;Major life changes  Chaplain visited during rounding, patient verbalizes feeling loss of local Pastor who passed no official person in place for visitation, provided therapeutic listening, emotional support, spiritual presence and prayer. Advised that further support services are available 24/7 as requested.

## 2015-11-24 NOTE — Care Management Important Message (Signed)
Important Message  Patient Details  Name: Peter Bruce MRN: [id number] Date of Birth: [birth year]   Medicare Important Message Given:  Yes    Nathen May 11/24/2015, 2:59 PM

## 2015-11-24 NOTE — Progress Notes (Signed)
K+= 3.7 and creat= 1.21 w/ urine o/p > 30cc/hr; TCTS KCL protocol initiated with 10 mEq KCL in 50cc IV x 3, each over one hour.

## 2015-11-24 NOTE — Clinical Documentation Improvement (Addendum)
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  Other medical cause for occlusion  Unknown   Supporting Information: Severe Mitral Valve Regurgitation and Severe  Pulmonary  Hypertension    Please exercise your independent, professional judgment when responding. A specific answer is not anticipated or expected.   Thank You,  [place] [phone]

## 2015-11-25 LAB — CBC
HEMATOCRIT: 34.9 % — AB (ref 39.0–52.0)
HEMOGLOBIN: 11.4 g/dL — AB (ref 13.0–17.0)
MCH: 30.8 pg (ref 26.0–34.0)
MCHC: 32.7 g/dL (ref 30.0–36.0)
MCV: 94.3 fL (ref 78.0–100.0)
Platelets: 86 10*3/uL — ABNORMAL LOW (ref 150–400)
RBC: 3.7 MIL/uL — ABNORMAL LOW (ref 4.22–5.81)
RDW: 14.9 % (ref 11.5–15.5)
WBC: 11.5 10*3/uL — ABNORMAL HIGH (ref 4.0–10.5)

## 2015-11-25 LAB — BASIC METABOLIC PANEL
Anion gap: 5 (ref 5–15)
BUN: 15 mg/dL (ref 6–20)
CHLORIDE: 101 mmol/L (ref 101–111)
CO2: 30 mmol/L (ref 22–32)
CREATININE: 1.07 mg/dL (ref 0.61–1.24)
Calcium: 8.3 mg/dL — ABNORMAL LOW (ref 8.9–10.3)
GFR calc Af Amer: >60 mL/min (ref >60)
GFR calc non Af Amer: >60 mL/min (ref >60)
Glucose, Bld: 97 mg/dL (ref 65–99)
Potassium: 4.2 mmol/L (ref 3.5–5.1)
SODIUM: 136 mmol/L (ref 135–145)

## 2015-11-25 LAB — HEPARIN INDUCED PLATELET AB (HIT ANTIBODY): Heparin Induced Plt Ab: 0.472 OD — ABNORMAL HIGH (ref 0.000–0.400)

## 2015-11-25 LAB — PROTIME-INR
INR: 1.33 (ref 0.00–1.49)
PROTHROMBIN TIME: 16.6 s — AB (ref 11.6–15.2)

## 2015-11-25 MED ORDER — IRBESARTAN 150 MG PO TABS
75.0000 mg | ORAL_TABLET | Freq: Every day | ORAL | Status: DC
Start: 2015-11-25 — End: 2015-12-01
  Administered 2015-11-25 – 2015-12-01 (×7): 75 mg via ORAL
  Filled 2015-11-25 (×7): qty 1

## 2015-11-25 MED ORDER — PATIENT'S GUIDE TO USING COUMADIN BOOK
Freq: Once | Status: AC
  Administered 2015-11-25: 15:00:00
  Filled 2015-11-25: qty 1

## 2015-11-25 NOTE — Progress Notes (Signed)
During report was told that patient was without IV, ordered IV team consult at 0710.  Cyndia Bent

## 2015-11-25 NOTE — Progress Notes (Addendum)
      [address]       [place] [address]             [phone]      4 Days Post-Op Procedure(s) (LRB): MINIMALLY INVASIVE REOPERATION FOR MITRAL VALVE REPAIR (Right) TRANSESOPHAGEAL ECHOCARDIOGRAM (TEE) (N/A) LEFT CHEST TUBE INSERTION   Subjective:  States he is doing okay considering.  He states he does not want IV Lasix as he is unable to control his bladder.     Objective: Vital signs in last 24 hours: Temp:  [98.1 F (36.7 C)-98.9 F (37.2 C)] 98.9 F (37.2 C) (07/18 0527) Pulse Rate:  [48-77] 77 (07/18 0527) Cardiac Rhythm:  [-] Sinus bradycardia;Heart block (07/17 2000) Resp:  [18-21] 18 (07/18 0527) BP: (106-141)/(68-96) 141/81 mmHg (07/18 0527) SpO2:  [95 %-100 %] 95 % (07/18 0527) Weight:  [186 lb 15.2 oz (84.8 kg)] 186 lb 15.2 oz (84.8 kg) (07/18 0527)  Intake/Output from previous day: 07/17 0701 - 07/18 0700 In: 273.1 [P.O.:100; I.V.:23.1; IV Piggyback:150] Out: 1350 [Urine:900; Chest Tube:450] Intake/Output this shift: Total I/O In: -  Out: 260 [Chest Tube:260]  General appearance: alert, cooperative and no distress Heart: regular rate and rhythm Lungs: clear to auscultation bilaterally Abdomen: soft, non-tender; bowel sounds normal; no masses,  no organomegaly Extremities: edema trace Wound: clean and dry  Lab Results:  Recent Labs  11/24/15 0402 11/25/15 0215  WBC 12.7* 11.5*  HGB 11.9* 11.4*  HCT 36.8* 34.9*  PLT 58* 86*   BMET:  Recent Labs  11/24/15 0402 11/25/15 0215  NA 136 136  K 3.7 4.2  CL 102 101  CO2 30 30  GLUCOSE 96 97  BUN 15 15  CREATININE 1.21 1.07  CALCIUM 8.0* 8.3*    PT/INR:  Recent Labs  11/25/15 0215  LABPROT 16.6*  INR 1.33   ABG    Component Value Date/Time   PHART 7.350 11/21/2015 2148   HCO3 19.0* 11/21/2015 2148   TCO2 26 11/22/2015 1718   ACIDBASEDEF 6.0* 11/21/2015 2148   O2SAT 98.0 11/21/2015 2148   CBG (last 3)   Recent Labs  11/22/15 1914 11/22/15 2306  11/23/15 0401  GLUCAP 102* 112* 105*    Assessment/Plan: S/P Procedure(s) (LRB): MINIMALLY INVASIVE REOPERATION FOR MITRAL VALVE REPAIR (Right) TRANSESOPHAGEAL ECHOCARDIOGRAM (TEE) (N/A) LEFT CHEST TUBE INSERTION  1. CV- maintaining NSR, + HTN- will continue Lopressor, restart home ARB at reduced dose 2. INR 1.33, will repeat Coumadin at 5 mg daily 3. Pulm- no acute issues, Chest tube output remains high (level currently at 1420) leave in place today 4. Renal- creatinine improved at 1.07, weight is trending down, he is 3 lbs above preop, Lasix already changed to oral regimen 5. Expected post operative blood loss anemia- mild 6. Dispo- start ARB for HTN, d/c EPW, continue coumadin.. Transition Lasix to oral, leave chest tubes in place   LOS: 4 days    BARRETT, ERIN 11/25/2015  I have seen and examined the patient and agree with the assessment and plan as outlined.  Rexene Alberts, MD 11/25/2015 2:15 PM

## 2015-11-25 NOTE — Discharge Instructions (Addendum)
Information on my medicine - Coumadin®   (Warfarin) ° °This medication education was reviewed with me or my healthcare representative as part of my discharge preparation. ° °Why was Coumadin prescribed for you? °Coumadin was prescribed for you because you have a blood clot or a medical condition that can cause an increased risk of forming blood clots. Blood clots can cause serious health problems by blocking the flow of blood to the heart, lung, or brain. Coumadin can prevent harmful blood clots from forming. °As a reminder your indication for Coumadin is:   Blood Clot Prevention After Heart Valve Surgery ° °What test will check on my response to Coumadin? °While on Coumadin (warfarin) you will need to have an INR test regularly to ensure that your dose is keeping you in the desired range. The INR (international normalized ratio) number is calculated from the result of the laboratory test called prothrombin time (PT). ° °If an INR APPOINTMENT HAS NOT ALREADY BEEN MADE FOR YOU please schedule an appointment to have this lab work done by your health care provider within 7 days. °Your INR goal is usually a number between:  2 to 3 or your provider may give you a more narrow range like 2-2.5.  Ask your health care provider during an office visit what your goal INR is. ° °What  do you need to  know  About  COUMADIN? °Take Coumadin (warfarin) exactly as prescribed by your healthcare provider about the same time each day.  DO NOT stop taking without talking to the doctor who prescribed the medication.  Stopping without other blood clot prevention medication to take the place of Coumadin may increase your risk of developing a new clot or stroke.  Get refills before you run out. ° °What do you do if you miss a dose? °If you miss a dose, take it as soon as you remember on the same day then continue your regularly scheduled regimen the next day.  Do not take two doses of Coumadin at the same time. ° °Important Safety  Information °A possible side effect of Coumadin (Warfarin) is an increased risk of bleeding. You should call your healthcare provider right away if you experience any of the following: °? Bleeding from an injury or your nose that does not stop. °? Unusual colored urine (red or dark brown) or unusual colored stools (red or black). °? Unusual bruising for unknown reasons. °? A serious fall or if you hit your head (even if there is no bleeding). ° °Some foods or medicines interact with Coumadin® (warfarin) and might alter your response to warfarin. To help avoid this: °? Eat a balanced diet, maintaining a consistent amount of Vitamin K. °? Notify your provider about major diet changes you plan to make. °? Avoid alcohol or limit your intake to 1 drink for women and 2 drinks for men per day. °(1 drink is 5 oz. wine, 12 oz. beer, or 1.5 oz. liquor.) ° °Make sure that ANY health care provider who prescribes medication for you knows that you are taking Coumadin (warfarin).  Also make sure the healthcare provider who is monitoring your Coumadin knows when you have started a new medication including herbals and non-prescription products. ° °Coumadin® (Warfarin)  Major Drug Interactions  °Increased Warfarin Effect Decreased Warfarin Effect  °Alcohol (large quantities) °Antibiotics (esp. Septra/Bactrim, Flagyl, Cipro) °Amiodarone (Cordarone) °Aspirin (ASA) °Cimetidine (Tagamet) °Megestrol (Megace) °NSAIDs (ibuprofen, naproxen, etc.) °Piroxicam (Feldene) °Propafenone (Rythmol SR) °Propranolol (Inderal) °Isoniazid (INH) °Posaconazole (Noxafil) Barbiturates (Phenobarbital) °Carbamazepine (  Phenobarbital) Carbamazepine (Tegretol) Chlordiazepoxide (Librium) Cholestyramine (Questran) Griseofulvin Oral Contraceptives Rifampin Sucralfate (Carafate) Vitamin K   Coumadin (Warfarin) Major Herbal Interactions  Increased Warfarin Effect Decreased Warfarin Effect  Garlic Ginseng Ginkgo biloba Coenzyme Q10 Green tea St. Johns wort    Coumadin (Warfarin)  FOOD Interactions  Eat a consistent number of servings per week of foods HIGH in Vitamin K (1 serving =  cup)  Collards (cooked, or boiled & drained) Kale (cooked, or boiled & drained) Mustard greens (cooked, or boiled & drained) Parsley *serving size only =  cup Spinach (cooked, or boiled & drained) Swiss chard (cooked, or boiled & drained) Turnip greens (cooked, or boiled & drained)  Eat a consistent number of servings per week of foods MEDIUM-HIGH in Vitamin K (1 serving = 1 cup)  Asparagus (cooked, or boiled & drained) Broccoli (cooked, boiled & drained, or raw & chopped) Brussel sprouts (cooked, or boiled & drained) *serving size only =  cup Lettuce, raw (green leaf, endive, romaine) Spinach, raw Turnip greens, raw & chopped   These websites have more information on Coumadin (warfarin):  FailFactory.se; VeganReport.com.au;   Mitral Valve Repair, Care After Refer to this sheet in the next few weeks. These instructions provide you with information on caring for yourself after your procedure. Your health care provider may also give you specific instructions. Your treatment has been planned according to current medical practices, but problems sometimes occur. Call your health care provider if you have any problems or questions after your procedure.  HOME CARE INSTRUCTIONS   Take medicines only as directed by your health care provider.  Take your temperature every morning for the first 7 days after surgery. Write these down.  Weigh yourself every morning for at least 7 days after surgery. Write your weight down.  Wear elastic stockings during the day for at least 2 weeks after surgery or as directed by your health care provider. Use them longer if your ankles are swollen. The stockings help blood flow and help reduce swelling in the legs.  Take frequent naps or rest often throughout the day.  Avoid lifting more than 10 lb (4.5 kg) or pushing or pulling things  with your arms for 6-8 weeks or as directed by your health care provider.  Avoid driving or airplane travel for 4-6 weeks after surgery or as directed. If you are riding in a car for an extended period, stop every 1-2 hours to stretch your legs.  Avoid crossing your legs.  Avoid climbing stairs and using the handrail to pull yourself up for the first 2-3 weeks after surgery.  Do not take baths for 2-4 weeks after surgery. Take showers once your health care provider approves. Pat incisions dry. Do not rub incisions with a washcloth or towel.  Return to work as directed by your health care provider.  Drink enough fluids to keep your urine clear or pale yellow.  Do not strain to have a bowel movement. Eat high-fiber foods if you become constipated. You may also take a medicine to help you have a bowel movement (laxative) as directed by your health care provider.  Resume sexual activity as directed by your health care provider. SEEK MEDICAL CARE IF:   You develop a skin rash.   Your weight is increasing each day over 2-3 days.  Your weight increases by 2 or more pounds (1 kg) in a single day.  You have a fever. SEEK IMMEDIATE MEDICAL CARE IF:   You develop chest pain that  is not coming from your incision.  You develop shortness of breath or difficulty breathing.  You have drainage, redness, swelling, or pain at your incision site.  You have pus coming from your incision.  You develop light-headedness. MAKE SURE YOU:  Understand these directions.  Will watch your condition.  Will get help right away if you are not doing well or get worse.   This information is not intended to replace advice given to you by your health care provider. Make sure you discuss any questions you have with your health care provider.   Document Released: 11/13/2004 Document Revised: 05/17/2014 Document Reviewed: 09/26/2012 Elsevier Interactive Patient Education [employer].

## 2015-11-25 NOTE — Progress Notes (Signed)
CARDIAC REHAB PHASE I   PRE:  Rate/Rhythm: 3 SB  BP:  Sitting: 95/74        SaO2: 98 RA  MODE:  Ambulation: 500 ft   POST:  Rate/Rhythm: 86 SR c/PVCs  BP:  Sitting: 124/81         SaO2: 94 RA  Pt in recliner, stood with minimal assistance. Pt ambulated 500 ft on RA, rolling walker, chest tube, assist x1, fairly steady gait, tolerated well with no real complaints. Pt appreciative of walk. Encouraged additional ambulation x2 today, IS. Pt to recliner after walk, call bell within reach. Will follow.    [place], RN, BSN 11/25/2015 11:41 AM

## 2015-11-26 LAB — PROTIME-INR
INR: 1.35 (ref 0.00–1.49)
Prothrombin Time: 16.8 seconds — ABNORMAL HIGH (ref 11.6–15.2)

## 2015-11-26 MED ORDER — WARFARIN SODIUM 7.5 MG PO TABS
7.5000 mg | ORAL_TABLET | Freq: Every day | ORAL | Status: DC
  Administered 2015-11-26 – 2015-11-29 (×4): 7.5 mg via ORAL
  Filled 2015-11-26 (×4): qty 1

## 2015-11-26 NOTE — Progress Notes (Addendum)
      [address]       [place] [address]             [phone]      5 Days Post-Op Procedure(s) (LRB): MINIMALLY INVASIVE REOPERATION FOR MITRAL VALVE REPAIR (Right) TRANSESOPHAGEAL ECHOCARDIOGRAM (TEE) (N/A) LEFT CHEST TUBE INSERTION   Subjective:  Peter Bruce has no new complaints.  States he tolerated the Lasix better yesterday being oral regimen.  + ambulation  + BM  Objective: Vital signs in last 24 hours: Temp:  [97.5 F (36.4 C)-97.7 F (36.5 C)] 97.7 F (36.5 C) (07/19 0509) Pulse Rate:  [44-53] 46 (07/19 0509) Cardiac Rhythm:  [-] Heart block (07/18 2008) Resp:  [17-18] 18 (07/19 0509) BP: (98-117)/(58-75) 114/75 mmHg (07/19 0509) SpO2:  [95 %-98 %] 95 % (07/19 0509) Weight:  [187 lb 9.8 oz (85.1 kg)] 187 lb 9.8 oz (85.1 kg) (07/19 0509)  Intake/Output from previous day: 07/18 0701 - 07/19 0700 In: 680 [P.O.:680] Out: 1960 [Urine:1100; Chest Tube:860]  General appearance: alert, cooperative and no distress Heart: regular rate and rhythm Lungs: clear to auscultation bilaterally Abdomen: soft, non-tender; bowel sounds normal; no masses,  no organomegaly Extremities: edema trace Wound: clean and dry  Lab Results:  Recent Labs  11/24/15 0402 11/25/15 0215  WBC 12.7* 11.5*  HGB 11.9* 11.4*  HCT 36.8* 34.9*  PLT 58* 86*   BMET:  Recent Labs  11/24/15 0402 11/25/15 0215  NA 136 136  K 3.7 4.2  CL 102 101  CO2 30 30  GLUCOSE 96 97  BUN 15 15  CREATININE 1.21 1.07  CALCIUM 8.0* 8.3*    PT/INR:  Recent Labs  11/26/15 0435  LABPROT 16.8*  INR 1.35   ABG    Component Value Date/Time   PHART 7.350 11/21/2015 2148   HCO3 19.0* 11/21/2015 2148   TCO2 26 11/22/2015 1718   ACIDBASEDEF 6.0* 11/21/2015 2148   O2SAT 98.0 11/21/2015 2148   CBG (last 3)  No results for input(s): GLUCAP in the last 72 hours.  Assessment/Plan: S/P Procedure(s) (LRB): MINIMALLY INVASIVE REOPERATION FOR MITRAL VALVE REPAIR  (Right) TRANSESOPHAGEAL ECHOCARDIOGRAM (TEE) (N/A) LEFT CHEST TUBE INSERTION  1. CV- severe Bradycardia overnight with rate in the 30s, no P waves were appreciated . Currently rate in the 80s with artifact making it difficult to assess telemetry monitor...Marland KitchenMarland KitchenMarland Kitchen Will get 12 lead EKG, stop beta blocker, continue ARB for BP control.. 2. Pulm- no acute issues, continue IS.Marland Kitchen Chest tube output remains high ( 580 cc output yesterday, Pleurovac changed this morning, currently at 30 cc)... Leave chest tubes in place for now 3. Renal- weight continues to trend down, continue Lasix 4. INR 1.35.Marland Kitchen Minimal response to 5 mg, will increase Coumadin to 7.5mg  daily 5. Dispo- patient with severe bradycardia/?CHB overnight... Will stop Beta Blocker, get 12 lead to assess rhythm this morning, increase coumadin, leave chest tubes for high output, continue current care   LOS: 5 days    Peter Bruce, Peter Bruce 11/26/2015  I have seen and examined the patient and agree with the assessment and plan as outlined.  Patient has gone back into Aflutter w/ variable AV conduction.  Attempted RAP to pace-terminate at bedside unsuccessful.  Hold beta blocker for now.  Continue coumadin.  Rexene Alberts, MD

## 2015-11-26 NOTE — Progress Notes (Signed)
CARDIAC REHAB PHASE I   PRE:  Rate/Rhythm: 53  BP:  Sitting: 112/75        SaO2: 97 RA  MODE:  Ambulation: 690 ft   POST:  Rate/Rhythm: 79   BP:  Sitting: 129/77         SaO2: 97 RA  Pt c/o of feeling tired today, agreeable to walk, states he has not walked yet today. Pt ambulated 690 ft on RA, rolling walker, chest tube, assist x1, steady gait, tolerated well with no complaints. Encouraged additional ambulation today as tolerated. Pt to recliner after walk, call bell within reach. Will follow.   [place], RN, BSN 11/26/2015 1:17 PM

## 2015-11-27 ENCOUNTER — Encounter: Payer: Self-pay | Admitting: Thoracic Surgery (Cardiothoracic Vascular Surgery)

## 2015-11-27 LAB — BASIC METABOLIC PANEL
ANION GAP: 4 — AB (ref 5–15)
BUN: 14 mg/dL (ref 6–20)
CO2: 29 mmol/L (ref 22–32)
Calcium: 8.4 mg/dL — ABNORMAL LOW (ref 8.9–10.3)
Chloride: 104 mmol/L (ref 101–111)
Creatinine, Ser: 1.1 mg/dL (ref 0.61–1.24)
GFR calc Af Amer: >60 mL/min (ref >60)
GLUCOSE: 88 mg/dL (ref 65–99)
POTASSIUM: 4.1 mmol/L (ref 3.5–5.1)
Sodium: 137 mmol/L (ref 135–145)

## 2015-11-27 LAB — CBC
HEMATOCRIT: 31.8 % — AB (ref 39.0–52.0)
Hemoglobin: 10.3 g/dL — ABNORMAL LOW (ref 13.0–17.0)
MCH: 30.3 pg (ref 26.0–34.0)
MCHC: 32.4 g/dL (ref 30.0–36.0)
MCV: 93.5 fL (ref 78.0–100.0)
PLATELETS: 104 10*3/uL — AB (ref 150–400)
RBC: 3.4 MIL/uL — AB (ref 4.22–5.81)
RDW: 14.5 % (ref 11.5–15.5)
WBC: 5.8 10*3/uL (ref 4.0–10.5)

## 2015-11-27 LAB — PROTIME-INR
INR: 1.46 (ref 0.00–1.49)
Prothrombin Time: 17.8 seconds — ABNORMAL HIGH (ref 11.6–15.2)

## 2015-11-27 NOTE — Progress Notes (Signed)
CARDIAC REHAB PHASE I   PRE:  Rate/Rhythm: 54  BP:  Sitting: 113/82        SaO2: 97 RA  MODE:  Ambulation: 690 ft   POST:  Rate/Rhythm: 81  BP:  Sitting: 129/81         SaO2: 97 RA  Pt ambulated 690 ft on RA, rolling walker, chest tube, assist x1, steady gait, tolerated well with no complaints. Encouraged additional ambulation x2 today. Pt to recliner after walk, call bell within reach. Will follow.   [id number]  Lenna Sciara, RN, BSN 11/27/2015 10:05 AM

## 2015-11-27 NOTE — Care Management Important Message (Signed)
Important Message  Patient Details  Name: Peter Bruce MRN: [id number] Date of Birth: Jul 28, 1950   Medicare Important Message Given:  Yes    Lerae Langham Abena 11/27/2015, 10:57 AM

## 2015-11-27 NOTE — Progress Notes (Addendum)
      [address]       [place] [address]             [phone]        6 Days Post-Op Procedure(s) (LRB): MINIMALLY INVASIVE REOPERATION FOR MITRAL VALVE REPAIR (Right) TRANSESOPHAGEAL ECHOCARDIOGRAM (TEE) (N/A) LEFT CHEST TUBE INSERTION  Subjective: Patient sitting on edge of bed talking with nurse and wife. No specific complaints at this time.  Objective: Vital signs in last 24 hours: Temp:  [98 F (36.7 C)-98.5 F (36.9 C)] 98 F (36.7 C) (07/20 0449) Pulse Rate:  [52-72] 72 (07/20 0449) Cardiac Rhythm:  [-] Heart block (07/20 0700) Resp:  [18-20] 20 (07/20 0449) BP: (111-133)/(68-89) 133/89 mmHg (07/20 0449) SpO2:  [95 %-98 %] 98 % (07/20 0449) Weight:  [187 lb 3.2 oz (84.913 kg)] 187 lb 3.2 oz (84.913 kg) (07/20 0449)  Pre op weight 83 kg Current Weight  11/27/15 187 lb 3.2 oz (84.913 kg)       Intake/Output from previous day: 07/19 0701 - 07/20 0700 In: 840 [P.O.:840] Out: 2941 [Urine:2400; Stool:1; Chest Tube:540]   Physical Exam:  Cardiovascular: No murmur Pulmonary: Clear to auscultation on left and coarse on the right. Abdomen: Soft, non tender, bowel sounds present. Extremities: Bilateral lower extremity edema. Wounds: Clean and dry.  No erythema or signs of infection.  Lab Results: CBC: Recent Labs  11/25/15 0215 11/27/15 0436  WBC 11.5* 5.8  HGB 11.4* 10.3*  HCT 34.9* 31.8*  PLT 86* 104*   BMET:  Recent Labs  11/25/15 0215 11/27/15 0436  NA 136 137  K 4.2 4.1  CL 101 104  CO2 30 29  GLUCOSE 97 88  BUN 15 14  CREATININE 1.07 1.10  CALCIUM 8.3* 8.4*    PT/INR:  Lab Results  Component Value Date   INR 1.46 11/27/2015   INR 1.35 11/26/2015   INR 1.33 11/25/2015   ABG:  INR: Will add last result for INR, ABG once components are confirmed Will add last 4 CBG results once components are confirmed  Assessment/Plan:  1. CV - Previous bradycardia, 2:1 A flutter. Rapid A pace was unsuccessful. HR in the  50's at time of exam. On Avapro 75 mg daily and Coumadin. INR slightly increased from 1.35 to 1.46. Continue with Coumadin 7.5 mg daily. 2.  Pulmonary - Chest tubes with 540 cc last 24 hours. Chest tubes to remain for now. Encourage incentive spirometer 3. Volume Overload - On Lasix 40 mg bid 4.  Acute blood loss anemia - H and H 10.3 and 31.8 5. Thrombocytopenia-platelets up to 104,000.   ZIMMERMAN,DONIELLE MPA-C 11/27/2015,1:00 PM  I have seen and examined the patient and agree with the assessment and plan as outlined.  Mr Arnesen continues to improve clinically.  HR improved.  Anticipate d/c home once chest tube output decreases enough to remove tubes.  Continue lasix - weight trending down but still 7 lbs above pre-op baseline.  Rexene Alberts, MD 11/27/2015 5:52 PM

## 2015-11-28 ENCOUNTER — Inpatient Hospital Stay (HOSPITAL_COMMUNITY): Payer: Medicare Other

## 2015-11-28 LAB — PROTIME-INR
INR: 1.73 — AB (ref 0.00–1.49)
Prothrombin Time: 20.2 seconds — ABNORMAL HIGH (ref 11.6–15.2)

## 2015-11-28 LAB — SEROTONIN RELEASE ASSAY (SRA)
SRA .2 IU/mL UFH Ser-aCnc: 1 % (ref 0–20)
SRA 100IU/mL UFH Ser-aCnc: 1 % (ref 0–20)

## 2015-11-28 NOTE — Progress Notes (Signed)
Pacer wires have been pulled and were intact. Patient tolerated it well. He has been advised to stay on bedrest for one hour and vitals are being taken q15. Will continue to monitor.

## 2015-11-28 NOTE — Care Management Note (Signed)
Case Management Note Marvetta Gibbons RN, BSN Unit 2W-Case Manager [phone]  Patient Details  Name: Peter Bruce MRN: [id number] Date of Birth: 1950/07/12  Subjective/Objective: Pt admitted s/p MVR                    Action/Plan: PTA pt lived at home with spouse- was active with [facility] [place] [facility]) for [facility] RN/PT services- may benefit from resumption of services at time of discharge- [facility] with [facility] following -CM to follow up for d/c needs  Expected Discharge Date:  12/02/15               Expected Discharge Plan:  [place]  In-House Referral:     Discharge planning Services  CM Consult  Post Acute Care Choice:  Home Health, Resumption of Svcs/PTA Provider Choice offered to:  Patient, Spouse  DME Arranged:    DME Agency:     HH Arranged:    [place]:  [facility] (now [facility])  Status of Service:  In process, will continue to follow  If discussed at Long Length of Stay Meetings, dates discussed:    Additional Comments:  Dawayne Patricia, RN 11/28/2015, 9:44 PM

## 2015-11-28 NOTE — Discharge Summary (Signed)
Physician Discharge Summary  Patient ID: Peter Bruce MRN: [id number] DOB/AGE: 67-22-1952 67 y.o.  Admit date: 11/21/2015 Discharge date: 12/01/2015  Admission Diagnoses:  Patient Active Problem List   Diagnosis Date Noted  . Essential hypertension   . Atrial fibrillation ([place]) 11/06/2015  . Acute CHF (congestive heart failure) ([place]) 11/01/2015  . CHF (congestive heart failure) ([place]) 11/01/2015  . Mitral regurgitation 10/30/2015  . Fatigue 10/14/2015  . Current use of long term anticoagulation 09/27/2015  . Bilateral pleural effusion 09/27/2015  . Acute on chronic diastolic heart failure ([place]) 09/27/2015  . Acute on chronic diastolic (congestive) heart failure ([place]) 09/27/2015  . Respiratory failure with hypoxia ([place]) 09/09/2015  . Acute respiratory failure with hypoxia ([place]) 09/09/2015  . Atrial flutter ([place])   . S/P CABG x 3 and maze procedure 05/29/2015  . Coronary artery disease involving native coronary artery 05/15/2015  . Dyslipidemia 04/14/2015  . Polymyalgia rheumatica--rheumatol Hawkes-2016 12/19/2013  . Obesity (BMI 30-39.9)   . BPH (benign prostatic hyperplasia) 08/30/2011  . Paroxysmal atrial fibrillation ([place]) 08/30/2011  . Chronic pain 08/30/2011  . Anxiety 04/21/2011  . History of pulmonary embolism 10/02/2008  . Lupus anticoagulant positive 07/29/2008   Discharge Diagnoses:   Patient Active Problem List   Diagnosis Date Noted  . S/P minimally invasive mitral valve repair 11/21/2015  . Essential hypertension   . Atrial fibrillation ([place]) 11/06/2015  . Acute CHF (congestive heart failure) ([place]) 11/01/2015  . CHF (congestive heart failure) ([place]) 11/01/2015  . Mitral regurgitation 10/30/2015  . Fatigue 10/14/2015  . Current use of long term anticoagulation 09/27/2015  . Bilateral pleural effusion 09/27/2015  . Acute on chronic diastolic heart failure ([place]) 09/27/2015  . Acute on chronic diastolic (congestive) heart failure ([place]) 09/27/2015  . Respiratory  failure with hypoxia ([place]) 09/09/2015  . Acute respiratory failure with hypoxia ([place]) 09/09/2015  . Atrial flutter ([place])   . S/P CABG x 3 and maze procedure 05/29/2015  . Coronary artery disease involving native coronary artery 05/15/2015  . Dyslipidemia 04/14/2015  . Polymyalgia rheumatica--rheumatol Hawkes-2016 12/19/2013  . Obesity (BMI 30-39.9)   . BPH (benign prostatic hyperplasia) 08/30/2011  . Paroxysmal atrial fibrillation ([place]) 08/30/2011  . Chronic pain 08/30/2011  . Anxiety 04/21/2011  . History of pulmonary embolism 10/02/2008  . Lupus anticoagulant positive 07/29/2008   Discharged Condition: good  History of Present Illness:  Patient Active Problem List   Diagnosis Date Noted  . S/P minimally invasive mitral valve repair 11/21/2015  . Essential hypertension   . Atrial fibrillation ([place]) 11/06/2015  . Acute CHF (congestive heart failure) ([place]) 11/01/2015  . CHF (congestive heart failure) ([place]) 11/01/2015  . Mitral regurgitation 10/30/2015  . Fatigue 10/14/2015  . Current use of long term anticoagulation 09/27/2015  . Bilateral pleural effusion 09/27/2015  . Acute on chronic diastolic heart failure ([place]) 09/27/2015  . Acute on chronic diastolic (congestive) heart failure ([place]) 09/27/2015  . Respiratory failure with hypoxia ([place]) 09/09/2015  . Acute respiratory failure with hypoxia ([place]) 09/09/2015  . Atrial flutter ([place])   . S/P CABG x 3 and maze procedure 05/29/2015  . Coronary artery disease involving native coronary artery 05/15/2015  . Dyslipidemia 04/14/2015  . Polymyalgia rheumatica--rheumatol Hawkes-2016 12/19/2013  . Obesity (BMI 30-39.9)   . BPH (benign prostatic hyperplasia) 08/30/2011  . Paroxysmal atrial fibrillation ([place]) 08/30/2011  . Chronic pain 08/30/2011  . Anxiety 04/21/2011  . History of pulmonary embolism 10/02/2008  . Lupus anticoagulant positive 07/29/2008   HISTORY OF PRESENT ILLNESS:  Mr.  Peter Bruce is a 65 yo male with history of  coronary artery disease, hypertension, borderline hyperlipidemia, lupus anticoagulant positive with remote history of pulmonary embolus, polymyalgia rheumatica, and chronic persistent atrial fibrillation who underwent coronary artery bypass grafting 3 and Maze procedure on 05/29/2015 for severe three-vessel coronary artery disease with abnormal stress test and history of chronic persistent atrial fibrillation. Grafts placed at the time of surgery included left internal mammary artery to the distal left anterior descending coronary artery, saphenous vein graft to the distal right coronary artery, and saphenous vein graft to the second obtuse marginal branch of the left circumflex coronary artery. The terminal portion of the second obtuse marginal branch of the left circumflex coronary artery was notably diffusely diseased. Intraoperative transesophageal echocardiogram performed at the time of surgery revealed normal left ventricular function with only mild central mitral regurgitation. The patient's early postoperative recovery was entirely uncomplicated. He was last seen in our office on 08/11/2015 at which time he was doing remarkably well, although he appeared to be in rate controlled atrial flutter. He was seen in follow-up shortly after that by Dr. Martinique and plans for elective DC cardioversion were discussed. Over the next several weeks the patient developed worsening shortness of breath and congestion. He was seen in urgent care and treated for possible community acquired pneumonia. He was hospitalized briefly in May with acute exacerbation of chronic diastolic congestive heart failure. Echocardiogram performed at that time revealed normal left ventricular function with ejection fraction estimated 50-55%. There was reportedly "moderate" mitral regurgitation that was directed posteriorly, severe pulmonary hypertension, and mild to moderate tricuspid regurgitation. DC cardioversion that was unsuccessful.  He was subsequently loaded with amiodarone. Symptoms continued to deteriorate and the patient was readmitted to the hospital 09/27/2015 with respiratory failure and volume overload. Initially the patient appeared to be in atrial flutter but he spontaneously converted to sinus rhythm. Symptoms improved with medical therapy and he was discharged home. The patient was seen in follow-up on 2 occasions in early June and continued to complain of exertional shortness of breath and fatigue. He ultimately sought a second opinion and was evaluated by Dr. Einar Gip who noted a prominent holosystolic murmur on physical exam. Transthoracic echocardiogram performed in his office suggested the presence of significant mitral regurgitation. The patient underwent transesophageal echocardiogram on 10/30/2015 that revealed severe mitral regurgitation. 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 Left ventricular systolic function appeared normal with ejection fraction estimated 50-55%. No other significant abnormalities were noted. Chest x-ray performed at that time revealed a large right pleural effusion. The patient underwent thoracentesis yielding more than 1 L of fluid. Symptoms improved considerably. Left and right heart catheterization was performed 11/17/2015. This revealed patent left internal mammary artery to the distal left anterior descending coronary artery and patent saphenous vein graft to the distal right coronary artery. The saphenous vein graft to the second obtuse marginal branch was occluded as was the entire second obtuse marginal branch. There was severe pulmonary hypertension with PA pressures measured 72/27. The patient was referred for surgical consultation.  He was evaluated by Dr. Roxy Manns at which time he admitted to getting short of breath with exertion and fatigue.  During the visit Dr. Roxy Manns reviewed the  patient's Echocardiogram and felt he would benefit from Mitral Valve repair, but he did not feel he would require repeat bypass grafting.  The risks and benefits of the procedure were explained to the patient  and he was agreeable to proceed.  Hospital Course:   Mr. Yurkovich presented to [facility] hospital on 11/21/2015.  He was taken to the operating room and underwent Minimally Invasive Reoperation for Mitral Valve Repair.   He tolerated the procedure without difficulty and was taken to the SICU in stable condition.  The patient was extubated the evening of surgery.  During his stay in the SICU the patient was weaned off Milrinone and Neo-synephrine drips as tolerated.  His arterial lines were removed without difficulty.  The patient developed severe thrombocytopenia post operatively.  HIT panel was obtained and was negative.  He was started on Coumadin for his mitral valve repair.  The patient was in atrial fibrillation and treated with IV amiodarone.  However, this was later discontinued as the patient did not want to take it.  He was hypervolemic and aggressively diuresed with IV Lasix.  He was felt medically stable for transfer to the step down unit in stable condition on POD #3.  The patient developed severe Bradycardia and Atrial Flutter with a 2:1 conduction.  He was taken off a beta blocker due to this.  He remained on coumadin, with his INR slowly trending up.  His most recent INR was 1.83.  He will be discharged home on 7.5 mg daily.  His HR has improved and is maintaining in the 80s. Rapid Atrial pacing was attempted but was not successful.  His chest tube output slowly decreased and his tubes were removed on POD #9.  He converted and maintained NSR.  His HR was elevated in the 90s.  He was again started on a beta blocker and tolerated without difficulty.  He continues to do well.  He ambulating without difficulty.  His pacing wires have been removed.  He is felt medically stable for discharge home  today.  Significant Diagnostic Studies: cardiac graphics:   Echocardiogram:  Study Conclusions  - Left ventricle: The cavity size was normal. There was moderate  focal basal hypertrophy of the septum with mild posterior wall  hypertrophy. Systolic function was normal. The estimated ejection  fraction was in the range of 50% to 55%. Wall motion was normal;  there were no regional wall motion abnormalities. - Aortic valve: Transvalvular velocity was within the normal range.  There was no stenosis. There was no regurgitation. - Mitral valve: Transvalvular velocity was within the normal range.  There was no evidence for stenosis. There was moderate  regurgitation directed posteriorly. - Left atrium: The atrium was moderately dilated. - Right ventricle: The cavity size was normal. Wall thickness was  normal. Systolic function was normal. - Tricuspid valve: There was mild-moderate regurgitation. - Pulmonary arteries: Systolic pressure was severely increased. PA  peak pressure: 67 mm Hg (S). - Inferior vena cava: The vessel was dilated. The respirophasic  diameter changes were blunted (< 50%), consistent with elevated  central venous pressure. - Pericardium, extracardiac: There was a left pleural effusion.  Treatments: surgery:    Minimally-Invasive Reoperation for Mitral Valve Repair Complex valvuloplasty including artificial Gore-tex neochord placement x12 Sorin Memo 3D Ring Annuloplasty (size 68mm, catalog # [id number], serial # [id number]) Drainage of bilateral pleural effusions  Disposition: 01-Home or Self Care   Discharge Medications:  The patient has been discharged on:   1.Beta Blocker:  Yes [ x  ]                              No   [   ]  If No, reason:   2.Ace Inhibitor/ARB: Yes [ x  ]                                     No  [    ]                                     If No, reason:  3.Statin:    Yes [ x  ]                  No  [   ]                  If No, reason:  4.Ecasa:  Yes  [x   ]                  No   [   ]                  If No, reason:     Medication List    STOP taking these medications   hydrALAZINE 25 MG tablet Commonly known as:  APRESOLINE   isosorbide mononitrate 120 MG 24 hr tablet Commonly known as:  IMDUR   Rivaroxaban 15 MG Tabs tablet Commonly known as:  XARELTO     TAKE these medications   acetaminophen 500 MG tablet Commonly known as:  TYLENOL Take 1,000 mg by mouth every 8 (eight) hours as needed for mild pain or moderate pain.   diazepam 10 MG tablet Commonly known as:  VALIUM Take 10 mg by mouth every 6 (six) hours as needed for anxiety.   furosemide 40 MG tablet Commonly known as:  LASIX Take 1 tablet (40 mg total) by mouth as needed for edema. What changed:  how much to take  when to take this   metoprolol succinate 25 MG 24 hr tablet Commonly known as:  TOPROL-XL Take 1 tablet (25 mg total) by mouth daily.   oxyCODONE 5 MG immediate release tablet Commonly known as:  Oxy IR/ROXICODONE Take 1-2 tablets (5-10 mg total) by mouth every 3 (three) hours as needed for severe pain.   potassium chloride SA 20 MEQ tablet Commonly known as:  K-DUR,KLOR-CON Take 1 tablet (20 mEq total) by mouth daily. If you are using Lasix   rosuvastatin 20 MG tablet Commonly known as:  CRESTOR Take 1 tablet (20 mg total) by mouth daily at 6 PM.   valsartan 160 MG tablet Commonly known as:  DIOVAN Take 1 tablet (160 mg total) by mouth daily.   warfarin 7.5 MG tablet Commonly known as:  COUMADIN Take 1 tablet (7.5 mg total) by mouth daily at 6 PM.   zolpidem 10 MG tablet Commonly known as:  AMBIEN Take 10 mg by mouth daily.      Follow-up Information    Rexene Alberts, MD On 12/15/2015.   Specialty:  Cardiothoracic Surgery Why:  Appointment is at 2:30 Contact information: [address] [place]  [id number] [place] On 12/15/2015.   Why:  Please get CXR at 2:00, located on first floor of our office building Contact information: [facility], Ulice Dash, MD On 12/02/2015.   Specialty:  Cardiology Why:  Appointment at 9:30 for PT/INR Check Contact information: [address] [place] [address] [phone]        Adrian Prows, MD On 12/10/2015.   Specialty:  Cardiology Why:  Appointment is at 11:15 Contact information: [address] [place] [address] [phone]           Signed: Ellwood Handler 12/01/2015, 11:17 AM

## 2015-11-28 NOTE — Progress Notes (Addendum)
      [address]       [place] [address]             [phone]      7 Days Post-Op Procedure(s) (LRB): MINIMALLY INVASIVE REOPERATION FOR MITRAL VALVE REPAIR (Right) TRANSESOPHAGEAL ECHOCARDIOGRAM (TEE) (N/A) LEFT CHEST TUBE INSERTION   Subjective:  Peter Bruce has no complaints.  States he is doing okay.  Objective: Vital signs in last 24 hours: Temp:  [98 F (36.7 C)-98.5 F (36.9 C)] 98.5 F (36.9 C) (07/21 0543) Pulse Rate:  [53-79] 79 (07/21 0543) Cardiac Rhythm:  [-] Junctional rhythm (07/20 1900) Resp:  [20] 20 (07/21 0543) BP: (97-132)/(50-74) 132/74 mmHg (07/21 0543) SpO2:  [94 %-97 %] 94 % (07/21 0543) Weight:  [186 lb 11.2 oz (84.687 kg)] 186 lb 11.2 oz (84.687 kg) (07/21 0543)  Intake/Output from previous day: 07/20 0701 - 07/21 0700 In: 480 [P.O.:480] Out: 2101 [Urine:2000; Stool:1; Chest Tube:100]  General appearance: alert, cooperative and no distress Heart: regular rate and rhythm Lungs: clear to auscultation bilaterally Abdomen: soft, non-tender; bowel sounds normal; no masses,  no organomegaly Extremities: edema trace Wound: clean and dry  Lab Results:  Recent Labs  11/27/15 0436  WBC 5.8  HGB 10.3*  HCT 31.8*  PLT 104*   BMET:  Recent Labs  11/27/15 0436  NA 137  K 4.1  CL 104  CO2 29  GLUCOSE 88  BUN 14  CREATININE 1.10  CALCIUM 8.4*    PT/INR:  Recent Labs  11/28/15 0409  LABPROT 20.2*  INR 1.73*   ABG    Component Value Date/Time   PHART 7.350 11/21/2015 2148   HCO3 19.0* 11/21/2015 2148   TCO2 26 11/22/2015 1718   ACIDBASEDEF 6.0* 11/21/2015 2148   O2SAT 98.0 11/21/2015 2148   CBG (last 3)  No results for input(s): GLUCAP in the last 72 hours.  Assessment/Plan: S/P Procedure(s) (LRB): MINIMALLY INVASIVE REOPERATION FOR MITRAL VALVE REPAIR (Right) TRANSESOPHAGEAL ECHOCARDIOGRAM (TEE) (N/A) LEFT CHEST TUBE INSERTION  1. CV- Bradycardia/A. Flutter- currently rate in the 80s- continue  Avapro, 2. INR 1.73, continue coumadin at 7.5 mg daily 3. Renal- remains hypervolemic, continue Lasix 4. Chest tube- output decreasing 370 cc output yesterday (level at 920)  5. Dispo- patient stable, will remove chest tube once output is less than 200, otherwise medically stable, continue current care   LOS: 7 days    BARRETT, ERIN 11/28/2015  I have seen and examined the patient and agree with the assessment and plan as outlined.  D/C pacing wires.  Hopefully can D/C tubes in 1-2 days then D/C home.  May need low dose beta blocker restarted if HR increases further but continue to hold for now.   Rexene Alberts, MD 11/28/2015 8:32 AM

## 2015-11-28 NOTE — Consult Note (Signed)
   [facility] Inpatient Consult   11/28/2015  Peter Bruce 1950-08-02 [id number]    Patient screened for [place] Management services. Went to bedside to offer and explain [facility] Care Management program with patient. Patient declined [place] Management follow up at this time. He reports he already has someone following him at home and that he truly does not want to have many more people contacting him after discharge. Accepted [facility] Care Management brochure with contact information to call in future if changes mind. States he will have his wife look over the brochure as well. Made inpatient RNCM aware that patient declined [place] Management program services.  Marthenia Rolling, MSN-Ed, RN,BSN [facility] Liaison [phone]

## 2015-11-29 LAB — BASIC METABOLIC PANEL
Anion gap: 7 (ref 5–15)
BUN: 14 mg/dL (ref 6–20)
CALCIUM: 8.8 mg/dL — AB (ref 8.9–10.3)
CHLORIDE: 99 mmol/L — AB (ref 101–111)
CO2: 29 mmol/L (ref 22–32)
CREATININE: 1.28 mg/dL — AB (ref 0.61–1.24)
GFR calc non Af Amer: 56 mL/min — ABNORMAL LOW (ref >60)
Glucose, Bld: 95 mg/dL (ref 65–99)
Potassium: 4.2 mmol/L (ref 3.5–5.1)
Sodium: 135 mmol/L (ref 135–145)

## 2015-11-29 LAB — CBC
HEMATOCRIT: 35.6 % — AB (ref 39.0–52.0)
HEMOGLOBIN: 11.6 g/dL — AB (ref 13.0–17.0)
MCH: 30.2 pg (ref 26.0–34.0)
MCHC: 32.6 g/dL (ref 30.0–36.0)
MCV: 92.7 fL (ref 78.0–100.0)
Platelets: 142 10*3/uL — ABNORMAL LOW (ref 150–400)
RBC: 3.84 MIL/uL — AB (ref 4.22–5.81)
RDW: 14.4 % (ref 11.5–15.5)
WBC: 5.8 10*3/uL (ref 4.0–10.5)

## 2015-11-29 LAB — PROTIME-INR
INR: 1.43 (ref 0.00–1.49)
Prothrombin Time: 17.5 seconds — ABNORMAL HIGH (ref 11.6–15.2)

## 2015-11-29 MED ORDER — FUROSEMIDE 40 MG PO TABS
40.0000 mg | ORAL_TABLET | Freq: Every day | ORAL | Status: DC
  Administered 2015-11-30 – 2015-12-01 (×2): 40 mg via ORAL
  Filled 2015-11-29 (×2): qty 1

## 2015-11-29 NOTE — Progress Notes (Addendum)
      [address]       [place] [address]             [phone]      8 Days Post-Op Procedure(s) (LRB): MINIMALLY INVASIVE REOPERATION FOR MITRAL VALVE REPAIR (Right) TRANSESOPHAGEAL ECHOCARDIOGRAM (TEE) (N/A) LEFT CHEST TUBE INSERTION   Subjective:  Mr. Stoen has some mild pain this morning, from where pacing wires were removed.  He otherwise is doing well.  Objective: Vital signs in last 24 hours: Temp:  [98.2 F (36.8 C)-98.3 F (36.8 C)] 98.2 F (36.8 C) (07/22 0509) Pulse Rate:  [81] 81 (07/22 0509) Cardiac Rhythm:  [-] Junctional rhythm (07/22 0703) Resp:  [20] 20 (07/22 0509) BP: (96-135)/(62-76) 122/76 mmHg (07/22 0509) SpO2:  [93 %-100 %] 93 % (07/22 0509) Weight:  [178 lb 12.8 oz (81.103 kg)] 178 lb 12.8 oz (81.103 kg) (07/22 0509)  Intake/Output from previous day: 07/21 0701 - 07/22 0700 In: 720 [P.O.:720] Out: 2825 [Urine:2675; Chest Tube:150]  General appearance: alert, cooperative and no distress Heart: regular rate and rhythm Lungs: clear to auscultation bilaterally Abdomen: soft, non-tender; bowel sounds normal; no masses,  no organomegaly Extremities: edema trace Wound: clean and dry  Lab Results:  Recent Labs  11/27/15 0436 11/29/15 0317  WBC 5.8 5.8  HGB 10.3* 11.6*  HCT 31.8* 35.6*  PLT 104* 142*   BMET:  Recent Labs  11/27/15 0436 11/29/15 0317  NA 137 135  K 4.1 4.2  CL 104 99*  CO2 29 29  GLUCOSE 88 95  BUN 14 14  CREATININE 1.10 1.28*  CALCIUM 8.4* 8.8*    PT/INR:  Recent Labs  11/29/15 0317  LABPROT 17.5*  INR 1.43   ABG    Component Value Date/Time   PHART 7.350 11/21/2015 2148   HCO3 19.0* 11/21/2015 2148   TCO2 26 11/22/2015 1718   ACIDBASEDEF 6.0* 11/21/2015 2148   O2SAT 98.0 11/21/2015 2148   CBG (last 3)  No results for input(s): GLUCAP in the last 72 hours.  Assessment/Plan: S/P Procedure(s) (LRB): MINIMALLY INVASIVE REOPERATION FOR MITRAL VALVE REPAIR (Right) TRANSESOPHAGEAL  ECHOCARDIOGRAM (TEE) (N/A) LEFT CHEST TUBE INSERTION  1. CV- NSR, BP controlled- continue Avapro 2. Pulm- chest tube 270 cc output yesterday (level at 1200)... Decreasing hopefully can remove tomorrow 3. INR down to 1.43, unsure why this dropped as was trending upwards on 1.75, will repeat 7.5 mg again tonight and if doesn't increase will have to increase dose to 10 mg daily 4. Renal- creatinine mildly elevated at 1.28, weight is below admission, will decrease Lasix to once a day dosing 5. Dispo- patient stable, watch INR unsure of why would decrease, may need to further increase dose, continue chest tubes.. Hopefully can remove tomorrow   LOS: 8 days    Ellwood Handler 11/29/2015   Chart reviewed, patient examined, agree with above. I would think that chest tubes should be able to come out tomorrow. His weight is below preop so he probably does not need continued diuresis.

## 2015-11-29 NOTE — Progress Notes (Signed)
In for ambulation, pt up in bathroom.  Pt reported that he had already completed two laps in the hallway with nursing staff and did not want to walk any right now.  Advised pt that cardiac rehab will follow up with him on Monday and to continue to ambulate with nursing staff in hallway. Cherre Huger, BSN

## 2015-11-30 LAB — BASIC METABOLIC PANEL
ANION GAP: 4 — AB (ref 5–15)
BUN: 17 mg/dL (ref 6–20)
CALCIUM: 8.6 mg/dL — AB (ref 8.9–10.3)
CO2: 32 mmol/L (ref 22–32)
Chloride: 98 mmol/L — ABNORMAL LOW (ref 101–111)
Creatinine, Ser: 1.17 mg/dL (ref 0.61–1.24)
GFR calc Af Amer: >60 mL/min (ref >60)
GFR calc non Af Amer: >60 mL/min (ref >60)
GLUCOSE: 91 mg/dL (ref 65–99)
POTASSIUM: 4.4 mmol/L (ref 3.5–5.1)
Sodium: 134 mmol/L — ABNORMAL LOW (ref 135–145)

## 2015-11-30 LAB — PROTIME-INR
INR: 1.38 (ref 0.00–1.49)
PROTHROMBIN TIME: 17.1 s — AB (ref 11.6–15.2)

## 2015-11-30 MED ORDER — METOPROLOL SUCCINATE ER 25 MG PO TB24
12.5000 mg | ORAL_TABLET | Freq: Every day | ORAL | Status: DC
  Administered 2015-11-30 – 2015-12-01 (×2): 12.5 mg via ORAL
  Filled 2015-11-30 (×2): qty 1

## 2015-11-30 MED ORDER — METOPROLOL SUCCINATE ER 25 MG PO TB24: 25.0000 mg | ORAL_TABLET | Freq: Every day | ORAL | Status: DC

## 2015-11-30 MED ORDER — WARFARIN SODIUM 10 MG PO TABS
10.0000 mg | ORAL_TABLET | Freq: Every day | ORAL | Status: DC
  Administered 2015-11-30: 10 mg via ORAL
  Filled 2015-11-30: qty 1

## 2015-11-30 NOTE — Progress Notes (Signed)
Order received to DC chest tube.  Chest tube removed.  Pt tol well.  Will cont to monitor.

## 2015-11-30 NOTE — Plan of Care (Signed)
Problem: Activity: Goal: Risk for activity intolerance will decrease Outcome: Completed/Met Date Met: 11/30/15 Pt. Walking in room and making a complete circle around the nursing station.

## 2015-11-30 NOTE — Progress Notes (Addendum)
      [address]       [place] [address]             [phone]      9 Days Post-Op Procedure(s) (LRB): MINIMALLY INVASIVE REOPERATION FOR MITRAL VALVE REPAIR (Right) TRANSESOPHAGEAL ECHOCARDIOGRAM (TEE) (N/A) LEFT CHEST TUBE INSERTION   Subjective:  Mr. Baab states he is doing okay.  Says he has some mild pain at his chest tube sites this morning.  Objective: Vital signs in last 24 hours: Temp:  [98.2 F (36.8 C)-98.6 F (37 C)] 98.6 F (37 C) (07/23 0453) Pulse Rate:  [72-92] 92 (07/23 0453) Cardiac Rhythm: Junctional rhythm (07/23 0701) Resp:  [18-20] 18 (07/23 0453) BP: (109-130)/(66-79) 130/79 (07/23 0453) SpO2:  [92 %-97 %] 97 % (07/23 0453) Weight:  [178 lb 11.2 oz (81.1 kg)] 178 lb 11.2 oz (81.1 kg) (07/23 0453)  Intake/Output from previous day: 07/22 0701 - 07/23 0700 In: 720 [P.O.:720] Out: 1065 [Urine:825; Chest Tube:240] Intake/Output this shift: Total I/O In: -  Out: 150 [Urine:150]  General appearance: alert, cooperative and no distress Heart: regular rate and rhythm Lungs: clear to auscultation bilaterally Abdomen: soft, non-tender; bowel sounds normal; no masses,  no organomegaly Extremities: edema trace Wound: clean and dry  Lab Results:  Recent Labs  11/29/15 0317  WBC 5.8  HGB 11.6*  HCT 35.6*  PLT 142*   BMET:  Recent Labs  11/29/15 0317 11/30/15 0233  NA 135 134*  K 4.2 4.4  CL 99* 98*  CO2 29 32  GLUCOSE 95 91  BUN 14 17  CREATININE 1.28* 1.17  CALCIUM 8.8* 8.6*    PT/INR:  Recent Labs  11/30/15 0233  LABPROT 17.1*  INR 1.38   ABG    Component Value Date/Time   PHART 7.350 11/21/2015 2148   HCO3 19.0 (L) 11/21/2015 2148   TCO2 26 11/22/2015 1718   ACIDBASEDEF 6.0 (H) 11/21/2015 2148   O2SAT 98.0 11/21/2015 2148   CBG (last 3)  No results for input(s): GLUCAP in the last 72 hours.  Assessment/Plan: S/P Procedure(s) (LRB): MINIMALLY INVASIVE REOPERATION FOR MITRAL VALVE REPAIR  (Right) TRANSESOPHAGEAL ECHOCARDIOGRAM (TEE) (N/A) LEFT CHEST TUBE INSERTION  1. CV- HR in the 90s- will start low dose Beta Blocker if tolerates, continue Avapro 2. INR 1.38, will increase coumadin to 10 mg daily 3. Pulm- no acute issues, CT with 160 cc output since noon yesterday... Will d/c chest tubes today 4. Renal- creatinine at 1.17, continue Lasix, weight is stable 5. Dispo- patient stable, d/c chest tubes, restart BB as HR is increasing, possibly ready for d/c in AM   LOS: 9 days    BARRETT, ERIN 11/30/2015   Chart reviewed, patient examined, agree with above. He looks good but INR not increasing. Increase coumadin to 10 mg today. Possibly home tomorrow if we can tell what dose of coumadin to use.

## 2015-12-01 ENCOUNTER — Inpatient Hospital Stay (HOSPITAL_COMMUNITY): Payer: Medicare Other

## 2015-12-01 LAB — PROTIME-INR
INR: 1.87 — ABNORMAL HIGH (ref 0.00–1.49)
PROTHROMBIN TIME: 21.5 s — AB (ref 11.6–15.2)

## 2015-12-01 MED ORDER — OXYCODONE HCL 5 MG PO TABS: 5.0000 mg | ORAL_TABLET | ORAL | 0 refills | Status: DC | PRN

## 2015-12-01 MED ORDER — POTASSIUM CHLORIDE CRYS ER 20 MEQ PO TBCR: 20.0000 meq | EXTENDED_RELEASE_TABLET | Freq: Every day | ORAL | 3 refills | Status: DC

## 2015-12-01 MED ORDER — WARFARIN SODIUM 7.5 MG PO TABS: 7.5000 mg | ORAL_TABLET | Freq: Every day | ORAL | 3 refills | Status: DC

## 2015-12-01 MED ORDER — ROSUVASTATIN CALCIUM 10 MG PO TABS: 20.0000 mg | ORAL_TABLET | Freq: Every day | ORAL | Status: DC

## 2015-12-01 MED ORDER — ROSUVASTATIN CALCIUM 20 MG PO TABS: 20.0000 mg | ORAL_TABLET | Freq: Every day | ORAL | 3 refills | Status: DC

## 2015-12-01 NOTE — Progress Notes (Signed)
Patient discharged home with his wife. Sutures were removed. IV was dc'd and was intact. Medications were educated on and he stated that he understood.

## 2015-12-01 NOTE — Care Management Note (Signed)
Case Management Note Marvetta Gibbons RN, BSN Unit 2W-Case Manager [phone]  Patient Details  Name: Peter Bruce MRN: [id number] Date of Birth: 09-22-1950  Subjective/Objective: Pt admitted s/p MVR                    Action/Plan: PTA pt lived at home with spouse- was active with [facility] [place] Go) for [facility] RN/PT services- may benefit from resumption of services at time of discharge- [facility] with [facility] following -CM to follow up for d/c needs  Expected Discharge Date:  12/01/15              Expected Discharge Plan:  [place]  In-House Referral:     Discharge planning Services  CM Consult  Post Acute Care Choice:  Home Health, Resumption of Svcs/PTA Provider Choice offered to:  Patient, Spouse  DME Arranged:    DME Agency:     HH Arranged:    [place]:  [facility] (now [facility])  Status of Service:  Completed, signed off  If discussed at [employer] of Stay Meetings, dates discussed:    Additional Comments:  12/01/15- 1130- Marvetta Gibbons RN, CM- pt for d/c home today with wife- no HH services needed- Pt ambulating over 600 ft- no CM needs for d/c noted.   Dawayne Patricia, RN 12/01/2015, 1:43 PM

## 2015-12-01 NOTE — Progress Notes (Signed)
CARDIAC REHAB PHASE I   PRE:  Rate/Rhythm: 86     BP: sitting 131/72    SaO2: 96 RA  MODE:  Ambulation: 690 ft   POST:  Rate/Rhythm: 96     BP: sitting 133/78     SaO2: 95 RA  Pt up in room getting cleaned up. Fairly steady on feet without RW. Used RW for ambulating. Sts he has one at home however sts he will prob not use it. No c/o walking. Ed completed. Pt is not interested in CRPII as he has his own ideas on exercise, etc. Gave brochure for if he changes his mind. Reminded him of daily wts and low sodium diet.  [place], ACSM 12/01/2015 9:07 AM

## 2015-12-01 NOTE — Progress Notes (Addendum)
[address]       [place] [address]             [phone]      10 Days Post-Op Procedure(s) (LRB): MINIMALLY INVASIVE REOPERATION FOR MITRAL VALVE REPAIR (Right) TRANSESOPHAGEAL ECHOCARDIOGRAM (TEE) (N/A) LEFT CHEST TUBE INSERTION Subjective: Feels fairly well, ambulating well/improving  Objective: Vital signs in last 24 hours: Temp:  [98.3 F (36.8 C)-100.1 F (37.8 C)] 98.6 F (37 C) (07/24 0359) Pulse Rate:  [51-69] 69 (07/24 0359) Cardiac Rhythm: Junctional rhythm (07/23 1912) Resp:  [19-20] 20 (07/24 0359) BP: (98-119)/(58-70) 107/70 (07/24 0359) SpO2:  [91 %-93 %] 93 % (07/24 0359) Weight:  [178 lb (80.7 kg)] 178 lb (80.7 kg) (07/24 0359)  Hemodynamic parameters for last 24 hours:    Intake/Output from previous day: 07/23 0701 - 07/24 0700 In: 240 [P.O.:240] Out: 150 [Urine:150] Intake/Output this shift: No intake/output data recorded.  General appearance: alert, cooperative and no distress Heart: regular rate and rhythm Lungs: mildly dim right base Abdomen: benign Extremities: + bil LE pitting edema Wound: incis healing well  Lab Results:  Recent Labs  11/29/15 0317  WBC 5.8  HGB 11.6*  HCT 35.6*  PLT 142*   BMET:  Recent Labs  11/29/15 0317 11/30/15 0233  NA 135 134*  K 4.2 4.4  CL 99* 98*  CO2 29 32  GLUCOSE 95 91  BUN 14 17  CREATININE 1.28* 1.17  CALCIUM 8.8* 8.6*    PT/INR:  Recent Labs  12/01/15 0314  LABPROT 21.5*  INR 1.87*   ABG    Component Value Date/Time   PHART 7.350 11/21/2015 2148   HCO3 19.0 (L) 11/21/2015 2148   TCO2 26 11/22/2015 1718   ACIDBASEDEF 6.0 (H) 11/21/2015 2148   O2SAT 98.0 11/21/2015 2148   CBG (last 3)  No results for input(s): GLUCAP in the last 72 hours.  Meds Scheduled Meds: . bisacodyl  10 mg Oral Daily   Or  . bisacodyl  10 mg Rectal Daily  . docusate sodium  200 mg Oral Daily  . furosemide  40 mg Oral Daily  . irbesartan  75 mg Oral Daily  . metoprolol  succinate  12.5 mg Oral Daily  . pantoprazole  40 mg Oral Daily  . potassium chloride  20 mEq Oral BID  . sodium chloride flush  3 mL Intravenous Q12H  . warfarin  10 mg Oral q1800  . Warfarin - Physician Dosing Inpatient   Does not apply q1800   Continuous Infusions: . sodium chloride Stopped (11/24/15 0000)   PRN Meds:.sodium chloride, diazepam, metoprolol, ondansetron (ZOFRAN) IV, oxyCODONE, sodium chloride flush, traMADol, zolpidem  Xrays Dg Chest 2 View  Result Date: 12/01/2015 CLINICAL DATA:  Chest tube removal. EXAM: CHEST  2 VIEW COMPARISON:  11/28/2015 . FINDINGS: Right chest tube is been removed. No pneumothorax. Prior CABG and cardiac valve replacement. Left atrial appendage clip in stable position. Cardiomegaly again noted. Low lung volumes with mild right base atelectasis and or infiltrate. Diffuse right chest wall subcutaneous emphysema again noted. IMPRESSION: 1. Interim removal of right chest tube. No pneumothorax. Extensive right chest wall subcutaneous emphysema. 2.  Prior CABG and cardiac valve replacement .  Stable cardiomegaly. 3. Low lung volumes with mild right base atelectasis and/or infiltrate. Electronically Signed   By: Marcello Moores  Register   On: 12/01/2015 07:16   Assessment/Plan: S/P Procedure(s) (LRB): MINIMALLY INVASIVE REOPERATION FOR MITRAL VALVE REPAIR (Right) TRANSESOPHAGEAL ECHOCARDIOGRAM (TEE) (N/A) LEFT  CHEST TUBE INSERTION  1 doing well 2 junctional rhythm, rate >60, BP controlled on low dose beta blocker, on ARB 3 sub q air is stable, no pntx, no sig effusion, mild atx 4 INR is increasing 5 cont diuresis for volume overload 6 poss home today INR coumadin dose is determined   LOS: 10 days    GOLD,WAYNE E 12/01/2015  I have seen and examined the patient and agree with the assessment and plan as outlined.  D/C home today on coumadin 7.5 mg/day.  Follow up INR with Dr Phill Myron, MD 12/01/2015 9:31 AM

## 2015-12-01 NOTE — Care Management Important Message (Signed)
Important Message  Patient Details  Name: Peter Bruce MRN: [id number] Date of Birth: 1950-11-28   Medicare Important Message Given:  Yes    Lenny Fiumara Abena 12/01/2015, 11:00 AM

## 2015-12-02 DIAGNOSIS — Z7901 Long term (current) use of anticoagulants: Secondary | ICD-10-CM | POA: Diagnosis not present

## 2015-12-08 DIAGNOSIS — Z9889 Other specified postprocedural states: Secondary | ICD-10-CM | POA: Diagnosis not present

## 2015-12-08 DIAGNOSIS — I25119 Atherosclerotic heart disease of native coronary artery with unspecified angina pectoris: Secondary | ICD-10-CM | POA: Diagnosis not present

## 2015-12-08 DIAGNOSIS — Z7901 Long term (current) use of anticoagulants: Secondary | ICD-10-CM | POA: Diagnosis not present

## 2015-12-08 DIAGNOSIS — Z86711 Personal history of pulmonary embolism: Secondary | ICD-10-CM | POA: Diagnosis not present

## 2015-12-11 ENCOUNTER — Other Ambulatory Visit: Payer: Self-pay | Admitting: Thoracic Surgery (Cardiothoracic Vascular Surgery)

## 2015-12-11 DIAGNOSIS — Z7901 Long term (current) use of anticoagulants: Secondary | ICD-10-CM | POA: Diagnosis not present

## 2015-12-11 DIAGNOSIS — Z951 Presence of aortocoronary bypass graft: Secondary | ICD-10-CM

## 2015-12-12 ENCOUNTER — Ambulatory Visit: Payer: Medicare Other | Admitting: Cardiology

## 2015-12-15 ENCOUNTER — Ambulatory Visit
Admission: RE | Admit: 2015-12-15 | Discharge: 2015-12-15 | Disposition: A | Discharge status: Home / Self Care | Payer: Medicare Other | Source: Ambulatory Visit | Attending: Thoracic Surgery (Cardiothoracic Vascular Surgery) | Admitting: Thoracic Surgery (Cardiothoracic Vascular Surgery)

## 2015-12-15 ENCOUNTER — Ambulatory Visit (INDEPENDENT_AMBULATORY_CARE_PROVIDER_SITE_OTHER): Payer: Self-pay | Admitting: Thoracic Surgery (Cardiothoracic Vascular Surgery)

## 2015-12-15 ENCOUNTER — Encounter: Payer: Self-pay | Admitting: Thoracic Surgery (Cardiothoracic Vascular Surgery)

## 2015-12-15 VITALS — BP 132/77 | HR 45 | Resp 18 | Ht 70.0 in | Wt 175.0 lb

## 2015-12-15 DIAGNOSIS — Z9889 Other specified postprocedural states: Secondary | ICD-10-CM

## 2015-12-15 DIAGNOSIS — Z951 Presence of aortocoronary bypass graft: Secondary | ICD-10-CM

## 2015-12-15 DIAGNOSIS — J90 Pleural effusion, not elsewhere classified: Secondary | ICD-10-CM | POA: Diagnosis not present

## 2015-12-15 MED ORDER — METOPROLOL SUCCINATE ER 25 MG PO TB24: 12.5000 mg | ORAL_TABLET | Freq: Every day | ORAL | 1 refills | Status: DC

## 2015-12-15 NOTE — Progress Notes (Signed)
[address]       [place] [address]             [phone]     CARDIOTHORACIC SURGERY OFFICE NOTE  Referring Provider is Adrian Prows, MD PCP is Adrian Prows, MD   HPI:  Patient returns to the office for routine follow-up status post minimally invasive reoperation for mitral valve repair on 11/21/2015. His postoperative recovery in the hospital was notable for the development of atrial flutter. Initially the patient was treated with amiodarone but this was stopped because of transient bradycardia and because he reported that he was intolerant and would not take the medication.  He was ultimately discharged home in stable rate controlled atrial flutter on warfarin anticoagulation. Since hospital discharge she has had his prothrombin time checked and dose adjusted at Dr. Irven Shelling office. By report the patient's INR was initially supratherapeutic but this was corrected.  The patient returns to our office for follow-up today with his wife. He reports that he is doing exceptionally well. He states that he already feels much better than he has for the last 2 or 3 months. He has not been having any problems with shortness of breath. He has had minimal soreness in his chest and he has not been taking any sort of pain relievers. His activity level continues to gradually improve. Appetite is good. Overall he is delighted with his progress.   Current Outpatient Prescriptions  Medication Sig Dispense Refill  . acetaminophen (TYLENOL) 500 MG tablet Take 1,000 mg by mouth every 8 (eight) hours as needed for mild pain or moderate pain.    . diazepam (VALIUM) 10 MG tablet Take 10 mg by mouth every 6 (six) hours as needed for anxiety.    . furosemide (LASIX) 40 MG tablet Take 1 tablet (40 mg total) by mouth as needed for edema. (Patient taking differently: Take 20 mg by mouth 2 (two) times daily. ) 30 tablet 1  . metoprolol succinate (TOPROL-XL) 25 MG 24 hr tablet Take 1 tablet (25 mg  total) by mouth daily. 90 tablet 1  . oxyCODONE (OXY IR/ROXICODONE) 5 MG immediate release tablet Take 1-2 tablets (5-10 mg total) by mouth every 3 (three) hours as needed for severe pain. 30 tablet 0  . potassium chloride SA (K-DUR,KLOR-CON) 20 MEQ tablet Take 1 tablet (20 mEq total) by mouth daily. If you are using Lasix 30 tablet 3  . rosuvastatin (CRESTOR) 20 MG tablet Take 1 tablet (20 mg total) by mouth daily at 6 PM. 30 tablet 3  . valsartan (DIOVAN) 160 MG tablet Take 1 tablet (160 mg total) by mouth daily.    Marland Kitchen warfarin (COUMADIN) 7.5 MG tablet Take 1 tablet (7.5 mg total) by mouth daily at 6 PM. 30 tablet 3  . zolpidem (AMBIEN) 10 MG tablet Take 10 mg by mouth daily.      No current facility-administered medications for this visit.       Physical Exam:   BP 132/77 (BP Location: Right Arm, Patient Position: Sitting, Cuff Size: Normal)   Pulse (!) 45   Resp 18   Ht 5\' 10"  (1.778 m)   Wt 175 lb (79.4 kg)   SpO2 98% Comment: RA  BMI 25.11 kg/m   General:  Well-appearing  Chest:   Clear to auscultation  CV:   Regular rate and rhythm without murmur  Incisions:  Healing nicely  Abdomen:  Soft nontender  Extremities:  Warm and well-perfused  Diagnostic  Tests:  2 channel telemetry rhythm strip demonstrates what appears to be junctional rhythm or sinus bradycardia with heart rate 40   CHEST  2 VIEW  COMPARISON:  12/01/2015.  FINDINGS: Prior CABG. Prior cardiac valve replacement . Left atrial appendage clip. Heart size normal. Lungs are clear. Small right sided effusion. No pneumothorax .  IMPRESSION: 1. Prior CABG. Prior cardiac valve replacement. Left atrial appendage clip. Stable cardiomegaly .  2. Small right pleural effusion. Interim resolution of right chest wall subcutaneous emphysema.   Electronically Signed   By: Marcello Moores  Register   On: 12/15/2015 14:21   Impression:  Patient appears to be doing remarkably well approximately 3 weeks status post  minimally invasive mitral valve repair. He is notably bradycardic here in the office today, although he is entirely asymptomatic. 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   Plan:  I have instructed the patient to cut his dose of Toprol-XL back to 12.5 mg daily. He will follow-up with Dr. Einar Gip in the office as soon as possible.  We have otherwise not recommended any changes to his current medications. I have encouraged the patient to continue to gradually increase his physical activity as tolerated with his primary limitation that he refrain from heavy lifting or strenuous use of his arms or shoulders. The patient will return to our office for routine follow-up and rhythm check in 4 weeks. He will call and return sooner should specific problems or questions arise.    Valentina Gu. Roxy Manns, MD 12/15/2015 3:09 PM

## 2015-12-15 NOTE — Patient Instructions (Addendum)
You may continue to gradually increase your physical activity as tolerated.  Refrain from any heavy lifting or strenuous use of your arms and shoulders until at least 8 weeks from the time of your surgery, and avoid activities that cause increased pain in your chest on the side of your surgical incision.  Otherwise you may continue to increase activities without any particular limitations.  Increase the intensity and duration of physical activity gradually.  Decrease your dose of Toprol XL to 12.5 mg daily.  Continue all other previous medications without any changes at this time  You are encouraged to enroll and participate in the outpatient cardiac rehab program beginning as soon as practical.

## 2015-12-18 DIAGNOSIS — Z7901 Long term (current) use of anticoagulants: Secondary | ICD-10-CM | POA: Diagnosis not present

## 2015-12-29 DIAGNOSIS — Z7901 Long term (current) use of anticoagulants: Secondary | ICD-10-CM | POA: Diagnosis not present

## 2016-01-05 DIAGNOSIS — Z7901 Long term (current) use of anticoagulants: Secondary | ICD-10-CM | POA: Diagnosis not present

## 2016-01-14 ENCOUNTER — Encounter (HOSPITAL_COMMUNITY): Payer: Self-pay | Admitting: *Deleted

## 2016-01-15 DIAGNOSIS — Z7901 Long term (current) use of anticoagulants: Secondary | ICD-10-CM | POA: Diagnosis not present

## 2016-01-15 DIAGNOSIS — I25119 Atherosclerotic heart disease of native coronary artery with unspecified angina pectoris: Secondary | ICD-10-CM | POA: Diagnosis not present

## 2016-01-15 DIAGNOSIS — D6859 Other primary thrombophilia: Secondary | ICD-10-CM | POA: Diagnosis not present

## 2016-01-15 DIAGNOSIS — Z9889 Other specified postprocedural states: Secondary | ICD-10-CM | POA: Diagnosis not present

## 2016-01-29 DIAGNOSIS — I480 Paroxysmal atrial fibrillation: Secondary | ICD-10-CM | POA: Diagnosis not present

## 2016-01-29 DIAGNOSIS — Z7901 Long term (current) use of anticoagulants: Secondary | ICD-10-CM | POA: Diagnosis not present

## 2016-01-29 DIAGNOSIS — Z9889 Other specified postprocedural states: Secondary | ICD-10-CM | POA: Diagnosis not present

## 2016-01-29 DIAGNOSIS — I25119 Atherosclerotic heart disease of native coronary artery with unspecified angina pectoris: Secondary | ICD-10-CM | POA: Diagnosis not present

## 2016-02-04 DIAGNOSIS — Z9889 Other specified postprocedural states: Secondary | ICD-10-CM | POA: Diagnosis not present

## 2016-02-04 DIAGNOSIS — Z7901 Long term (current) use of anticoagulants: Secondary | ICD-10-CM | POA: Diagnosis not present

## 2016-02-04 DIAGNOSIS — I10 Essential (primary) hypertension: Secondary | ICD-10-CM | POA: Diagnosis not present

## 2016-02-16 ENCOUNTER — Encounter: Payer: Self-pay | Admitting: Thoracic Surgery (Cardiothoracic Vascular Surgery)

## 2016-02-16 ENCOUNTER — Ambulatory Visit (INDEPENDENT_AMBULATORY_CARE_PROVIDER_SITE_OTHER): Payer: Self-pay | Admitting: Thoracic Surgery (Cardiothoracic Vascular Surgery)

## 2016-02-16 VITALS — BP 156/98 | HR 77 | Resp 16 | Ht 70.0 in | Wt 180.0 lb

## 2016-02-16 DIAGNOSIS — Z951 Presence of aortocoronary bypass graft: Secondary | ICD-10-CM

## 2016-02-16 DIAGNOSIS — Z9889 Other specified postprocedural states: Secondary | ICD-10-CM

## 2016-02-16 DIAGNOSIS — I340 Nonrheumatic mitral (valve) insufficiency: Secondary | ICD-10-CM

## 2016-02-16 NOTE — Patient Instructions (Signed)

## 2016-02-16 NOTE — Progress Notes (Signed)
[address]       [place] [address]             [phone]     CARDIOTHORACIC SURGERY OFFICE NOTE  Referring Provider is Adrian Prows, MD PCP is Adrian Prows, MD   HPI:  Patient is a 65 year old male with history of coronary artery disease, hypertension, lupus anticoagulant positive with previous pulmonary embolus in the remote past, polymyalgia rheumatica, and persistent atrial fibrillation and atrial flutter who underwent coronary artery bypass grafting and Maze procedure on 05/29/2015 and later developed severe symptomatic primary mitral regurgitation for which he underwent minimally invasive mitral valve repair on 11/21/2015. His postoperative recovery following mitral valve repair was notable for the development of atrial flutter. He was ultimately discharged home in stable rate controlled atrial flutter on warfarin anticoagulation. He was last seen in our office on 12/15/2015 at which time he had spontaneously converted into a slow junctional rhythm. His beta blocker was decreased at that time. Since then he has been followed carefully by Dr. Einar Gip and remained in sinus rhythm.  He remains anticoagulated using warfarin. He apparently underwent a follow-up echocardiogram recently and he returns to our office for routine follow-up today nearly 3 months following his mitral valve repair. The patient reports that he is doing exceptionally well. He is back at work and back it unrestricted full activity. He reports no symptoms of exertional shortness of breath whatsoever. He no longer has any pain or soreness in his chest. He is back at work doing strenuous physical activity and in fact he burned his right arm recently using a chain saw. He has been frustrated with difficulties maintaining his warfarin in a therapeutic range. He is scheduled for a INR check tomorrow at Dr. Irven Shelling office.   Current Outpatient Prescriptions  Medication Sig Dispense Refill  . acetaminophen  (TYLENOL) 500 MG tablet Take 1,000 mg by mouth every 8 (eight) hours as needed for mild pain or moderate pain.    . diazepam (VALIUM) 10 MG tablet Take 10 mg by mouth every 6 (six) hours as needed for anxiety.    . metoprolol succinate (TOPROL-XL) 25 MG 24 hr tablet Take 0.5 tablets (12.5 mg total) by mouth daily. 90 tablet 1  . rosuvastatin (CRESTOR) 20 MG tablet Take 1 tablet (20 mg total) by mouth daily at 6 PM. 30 tablet 3  . valsartan (DIOVAN) 160 MG tablet Take 1 tablet (160 mg total) by mouth daily.    Marland Kitchen warfarin (COUMADIN) 7.5 MG tablet Take 1 tablet (7.5 mg total) by mouth daily at 6 PM. 30 tablet 3  . zolpidem (AMBIEN) 10 MG tablet Take 10 mg by mouth daily.      No current facility-administered medications for this visit.       Physical Exam:   BP (!) 156/98   Pulse 77   Resp 16   Ht 5\' 10"  (1.778 m)   Wt 180 lb (81.6 kg)   SpO2 98% Comment: ON RA  BMI 25.83 kg/m   General:  Well-appearing  Chest:   Clear to auscultation with symmetrical breath sounds  CV:   Regular rate and rhythm without murmur  Incisions:  Completely healed  Abdomen:  Soft and nontender  Extremities:  Warm and well-perfused  Diagnostic Tests:  TRANSTHORACIC ECHOCARDIOGRAM  Report from transthoracic echocardiogram performed 01/29/2016 at Dr. Irven Shelling office is reviewed.  By report there was normal left ventricular size with ejection fraction calculated 50%. There was  severe left atrial enlargement. There was intact mitral valve repair with no residual mitral regurgitation. Mean transvalvular gradient across the valve was reported 2.6 mmHg. There was mild aortic insufficiency and mild pulmonary hypertension.   2 channel telemetry rhythm strip demonstrates normal sinus rhythm   Impression:  Patient is doing very well approximately 3 months status post minimally invasive mitral valve repair. He had previously undergone coronary artery bypass grafting and Maze procedure in January of this year. He is  maintaining sinus rhythm and back to normal unrestricted physical activity without any significant symptoms. Follow-up echocardiogram reportedly looks good with intact mitral valve repair and preserved left ventricular systolic function.   Plan:  The patient will continue to follow-up with Dr. Einar Gip.  We have not recommended any changes to his current medications.  The patient has been reminded regarding the importance of dental hygiene and the lifelong need for antibiotic prophylaxis for all dental cleanings and other related invasive procedures.  He will be referred to the atrial fibrillation clinic for surveillance following Maze procedure. He will return to our office for routine follow-up next July, approximately 1 year following his most recent surgical procedure.    Valentina Gu. Roxy Manns, MD 02/16/2016 1:27 PM

## 2016-02-18 DIAGNOSIS — Z7901 Long term (current) use of anticoagulants: Secondary | ICD-10-CM | POA: Diagnosis not present

## 2016-03-10 DIAGNOSIS — M353 Polymyalgia rheumatica: Secondary | ICD-10-CM | POA: Diagnosis not present

## 2016-03-10 DIAGNOSIS — M2550 Pain in unspecified joint: Secondary | ICD-10-CM | POA: Diagnosis not present

## 2016-03-10 DIAGNOSIS — R5383 Other fatigue: Secondary | ICD-10-CM | POA: Diagnosis not present

## 2016-03-10 DIAGNOSIS — Z7952 Long term (current) use of systemic steroids: Secondary | ICD-10-CM | POA: Diagnosis not present

## 2016-03-12 DIAGNOSIS — Z7901 Long term (current) use of anticoagulants: Secondary | ICD-10-CM | POA: Diagnosis not present

## 2016-04-07 DIAGNOSIS — Z6827 Body mass index (BMI) 27.0-27.9, adult: Secondary | ICD-10-CM | POA: Diagnosis not present

## 2016-04-07 DIAGNOSIS — E663 Overweight: Secondary | ICD-10-CM | POA: Diagnosis not present

## 2016-04-07 DIAGNOSIS — R5383 Other fatigue: Secondary | ICD-10-CM | POA: Diagnosis not present

## 2016-04-07 DIAGNOSIS — M2550 Pain in unspecified joint: Secondary | ICD-10-CM | POA: Diagnosis not present

## 2016-04-07 DIAGNOSIS — Z7952 Long term (current) use of systemic steroids: Secondary | ICD-10-CM | POA: Diagnosis not present

## 2016-04-07 DIAGNOSIS — M353 Polymyalgia rheumatica: Secondary | ICD-10-CM | POA: Diagnosis not present

## 2016-04-09 DIAGNOSIS — Z7901 Long term (current) use of anticoagulants: Secondary | ICD-10-CM | POA: Diagnosis not present

## 2016-04-12 DIAGNOSIS — Z7901 Long term (current) use of anticoagulants: Secondary | ICD-10-CM | POA: Diagnosis not present

## 2016-04-27 DIAGNOSIS — Z7901 Long term (current) use of anticoagulants: Secondary | ICD-10-CM | POA: Diagnosis not present

## 2016-04-30 DIAGNOSIS — Z79899 Other long term (current) drug therapy: Secondary | ICD-10-CM | POA: Diagnosis not present

## 2016-04-30 DIAGNOSIS — M353 Polymyalgia rheumatica: Secondary | ICD-10-CM | POA: Diagnosis not present

## 2016-05-25 DIAGNOSIS — I25119 Atherosclerotic heart disease of native coronary artery with unspecified angina pectoris: Secondary | ICD-10-CM | POA: Diagnosis not present

## 2016-05-25 DIAGNOSIS — Z7901 Long term (current) use of anticoagulants: Secondary | ICD-10-CM | POA: Diagnosis not present

## 2016-05-25 DIAGNOSIS — M25552 Pain in left hip: Secondary | ICD-10-CM | POA: Diagnosis not present

## 2016-05-25 DIAGNOSIS — D6859 Other primary thrombophilia: Secondary | ICD-10-CM | POA: Diagnosis not present

## 2016-05-25 DIAGNOSIS — M47816 Spondylosis without myelopathy or radiculopathy, lumbar region: Secondary | ICD-10-CM | POA: Diagnosis not present

## 2016-05-25 DIAGNOSIS — M545 Low back pain: Secondary | ICD-10-CM | POA: Diagnosis not present

## 2016-05-25 DIAGNOSIS — I480 Paroxysmal atrial fibrillation: Secondary | ICD-10-CM | POA: Diagnosis not present

## 2016-06-01 DIAGNOSIS — R5383 Other fatigue: Secondary | ICD-10-CM | POA: Diagnosis not present

## 2016-06-01 DIAGNOSIS — R5381 Other malaise: Secondary | ICD-10-CM | POA: Diagnosis not present

## 2016-06-01 DIAGNOSIS — R590 Localized enlarged lymph nodes: Secondary | ICD-10-CM | POA: Diagnosis not present

## 2016-06-01 DIAGNOSIS — I25119 Atherosclerotic heart disease of native coronary artery with unspecified angina pectoris: Secondary | ICD-10-CM | POA: Diagnosis not present

## 2016-06-03 ENCOUNTER — Ambulatory Visit (INDEPENDENT_AMBULATORY_CARE_PROVIDER_SITE_OTHER): Payer: Medicare Other | Admitting: Otolaryngology

## 2016-06-03 DIAGNOSIS — D3705 Neoplasm of uncertain behavior of pharynx: Secondary | ICD-10-CM | POA: Diagnosis not present

## 2016-06-03 DIAGNOSIS — D0008 Carcinoma in situ of pharynx: Secondary | ICD-10-CM | POA: Diagnosis not present

## 2016-06-03 DIAGNOSIS — D487 Neoplasm of uncertain behavior of other specified sites: Secondary | ICD-10-CM | POA: Diagnosis not present

## 2016-06-03 DIAGNOSIS — R220 Localized swelling, mass and lump, head: Secondary | ICD-10-CM

## 2016-06-04 ENCOUNTER — Other Ambulatory Visit (INDEPENDENT_AMBULATORY_CARE_PROVIDER_SITE_OTHER): Payer: Self-pay | Admitting: Otolaryngology

## 2016-06-04 DIAGNOSIS — R229 Localized swelling, mass and lump, unspecified: Secondary | ICD-10-CM

## 2016-06-04 DIAGNOSIS — IMO0002 Reserved for concepts with insufficient information to code with codable children: Secondary | ICD-10-CM

## 2016-06-04 DIAGNOSIS — R221 Localized swelling, mass and lump, neck: Secondary | ICD-10-CM

## 2016-06-07 ENCOUNTER — Ambulatory Visit
Admission: RE | Admit: 2016-06-07 | Discharge: 2016-06-07 | Disposition: A | Discharge status: Home / Self Care | Payer: Medicare Other | Source: Ambulatory Visit | Attending: Otolaryngology | Admitting: Otolaryngology

## 2016-06-07 DIAGNOSIS — R221 Localized swelling, mass and lump, neck: Secondary | ICD-10-CM | POA: Diagnosis not present

## 2016-06-07 DIAGNOSIS — IMO0002 Reserved for concepts with insufficient information to code with codable children: Secondary | ICD-10-CM

## 2016-06-07 DIAGNOSIS — R229 Localized swelling, mass and lump, unspecified: Secondary | ICD-10-CM

## 2016-06-10 ENCOUNTER — Telehealth: Payer: Self-pay | Admitting: *Deleted

## 2016-06-10 NOTE — Telephone Encounter (Addendum)
Oncology Nurse Navigator Documentation  Placed introductory call to new referral patient Peter Bruce.  He was unable to come to phone, spoke with his wife.  Introduced myself as the H&N oncology nurse navigator that works with Dr. Alvy Bimler to whom he has been referred by Dr.Teoh.  She confirmed understanding of referral, I provided her 2/5 3:30 PM appt with Dr. Alvy Bimler.  I briefly explained my role as his navigator, indicated that I would be joining them on Monday.  She provided Bx conducted 06/03/16 by Dr. Benjamine Mola, he is expecting referral physician to order PET.  She voiced understanding PET results needed to determine treatment plan.  I informed treatment plan likely to include radiotherapy, explained the purpose of a dental evaluation prior to starting RT.  She stated husband has own teeth, "good shape", has had wisdom teeth removed.  I confirmed her understanding of [place] location, explained arrival and MedOnc registration process for appt.  I provided my contact information, encouraged her to call with questions/concerns before next week.  She verbalized understanding of information provided, expressed appreciation for my call.  Gayleen Orem, RN, BSN, [place] Nurse [place] [facility] [place] [phone]

## 2016-06-11 ENCOUNTER — Emergency Department (HOSPITAL_COMMUNITY): Payer: Medicare Other

## 2016-06-11 ENCOUNTER — Encounter (HOSPITAL_COMMUNITY): Payer: Self-pay | Admitting: Emergency Medicine

## 2016-06-11 ENCOUNTER — Inpatient Hospital Stay (HOSPITAL_COMMUNITY)
Admission: EM | Admit: 2016-06-11 | Discharge: 2016-06-17 | DRG: 871 | Disposition: A | Discharge status: Home Health | Payer: Medicare Other | Attending: Internal Medicine | Admitting: Internal Medicine

## 2016-06-11 DIAGNOSIS — I5042 Chronic combined systolic (congestive) and diastolic (congestive) heart failure: Secondary | ICD-10-CM | POA: Diagnosis present

## 2016-06-11 DIAGNOSIS — I10 Essential (primary) hypertension: Secondary | ICD-10-CM

## 2016-06-11 DIAGNOSIS — I4892 Unspecified atrial flutter: Secondary | ICD-10-CM | POA: Diagnosis not present

## 2016-06-11 DIAGNOSIS — L930 Discoid lupus erythematosus: Secondary | ICD-10-CM

## 2016-06-11 DIAGNOSIS — Z951 Presence of aortocoronary bypass graft: Secondary | ICD-10-CM | POA: Diagnosis not present

## 2016-06-11 DIAGNOSIS — Z7901 Long term (current) use of anticoagulants: Secondary | ICD-10-CM | POA: Diagnosis not present

## 2016-06-11 DIAGNOSIS — B954 Other streptococcus as the cause of diseases classified elsewhere: Secondary | ICD-10-CM | POA: Diagnosis not present

## 2016-06-11 DIAGNOSIS — I639 Cerebral infarction, unspecified: Secondary | ICD-10-CM | POA: Diagnosis not present

## 2016-06-11 DIAGNOSIS — Z7952 Long term (current) use of systemic steroids: Secondary | ICD-10-CM | POA: Diagnosis not present

## 2016-06-11 DIAGNOSIS — R531 Weakness: Secondary | ICD-10-CM | POA: Diagnosis not present

## 2016-06-11 DIAGNOSIS — M353 Polymyalgia rheumatica: Secondary | ICD-10-CM | POA: Diagnosis not present

## 2016-06-11 DIAGNOSIS — S0990XA Unspecified injury of head, initial encounter: Secondary | ICD-10-CM | POA: Diagnosis not present

## 2016-06-11 DIAGNOSIS — E871 Hypo-osmolality and hyponatremia: Secondary | ICD-10-CM | POA: Diagnosis present

## 2016-06-11 DIAGNOSIS — I480 Paroxysmal atrial fibrillation: Secondary | ICD-10-CM | POA: Diagnosis present

## 2016-06-11 DIAGNOSIS — Z79899 Other long term (current) drug therapy: Secondary | ICD-10-CM

## 2016-06-11 DIAGNOSIS — D72829 Elevated white blood cell count, unspecified: Secondary | ICD-10-CM

## 2016-06-11 DIAGNOSIS — D62 Acute posthemorrhagic anemia: Secondary | ICD-10-CM | POA: Diagnosis present

## 2016-06-11 DIAGNOSIS — D649 Anemia, unspecified: Secondary | ICD-10-CM | POA: Diagnosis present

## 2016-06-11 DIAGNOSIS — D6859 Other primary thrombophilia: Secondary | ICD-10-CM | POA: Diagnosis present

## 2016-06-11 DIAGNOSIS — B955 Unspecified streptococcus as the cause of diseases classified elsewhere: Secondary | ICD-10-CM

## 2016-06-11 DIAGNOSIS — R05 Cough: Secondary | ICD-10-CM | POA: Diagnosis not present

## 2016-06-11 DIAGNOSIS — D638 Anemia in other chronic diseases classified elsewhere: Secondary | ICD-10-CM | POA: Diagnosis not present

## 2016-06-11 DIAGNOSIS — C029 Malignant neoplasm of tongue, unspecified: Secondary | ICD-10-CM | POA: Diagnosis present

## 2016-06-11 DIAGNOSIS — I76 Septic arterial embolism: Secondary | ICD-10-CM | POA: Diagnosis not present

## 2016-06-11 DIAGNOSIS — R410 Disorientation, unspecified: Secondary | ICD-10-CM

## 2016-06-11 DIAGNOSIS — Z888 Allergy status to other drugs, medicaments and biological substances status: Secondary | ICD-10-CM | POA: Diagnosis not present

## 2016-06-11 DIAGNOSIS — R291 Meningismus: Secondary | ICD-10-CM

## 2016-06-11 DIAGNOSIS — S300XXA Contusion of lower back and pelvis, initial encounter: Secondary | ICD-10-CM | POA: Diagnosis not present

## 2016-06-11 DIAGNOSIS — I2690 Septic pulmonary embolism without acute cor pulmonale: Secondary | ICD-10-CM | POA: Diagnosis not present

## 2016-06-11 DIAGNOSIS — I38 Endocarditis, valve unspecified: Secondary | ICD-10-CM

## 2016-06-11 DIAGNOSIS — R0682 Tachypnea, not elsewhere classified: Secondary | ICD-10-CM

## 2016-06-11 DIAGNOSIS — G9340 Encephalopathy, unspecified: Secondary | ICD-10-CM | POA: Diagnosis not present

## 2016-06-11 DIAGNOSIS — R7881 Bacteremia: Secondary | ICD-10-CM | POA: Diagnosis not present

## 2016-06-11 DIAGNOSIS — D696 Thrombocytopenia, unspecified: Secondary | ICD-10-CM | POA: Diagnosis present

## 2016-06-11 DIAGNOSIS — G039 Meningitis, unspecified: Secondary | ICD-10-CM | POA: Diagnosis present

## 2016-06-11 DIAGNOSIS — M25559 Pain in unspecified hip: Secondary | ICD-10-CM | POA: Diagnosis not present

## 2016-06-11 DIAGNOSIS — I25810 Atherosclerosis of coronary artery bypass graft(s) without angina pectoris: Secondary | ICD-10-CM | POA: Diagnosis not present

## 2016-06-11 DIAGNOSIS — I6340 Cerebral infarction due to embolism of unspecified cerebral artery: Secondary | ICD-10-CM | POA: Diagnosis present

## 2016-06-11 DIAGNOSIS — E7800 Pure hypercholesterolemia, unspecified: Secondary | ICD-10-CM | POA: Diagnosis present

## 2016-06-11 DIAGNOSIS — T826XXA Infection and inflammatory reaction due to cardiac valve prosthesis, initial encounter: Secondary | ICD-10-CM | POA: Diagnosis present

## 2016-06-11 DIAGNOSIS — M797 Fibromyalgia: Secondary | ICD-10-CM | POA: Diagnosis present

## 2016-06-11 DIAGNOSIS — I25119 Atherosclerotic heart disease of native coronary artery with unspecified angina pectoris: Secondary | ICD-10-CM | POA: Diagnosis present

## 2016-06-11 DIAGNOSIS — I5032 Chronic diastolic (congestive) heart failure: Secondary | ICD-10-CM | POA: Diagnosis present

## 2016-06-11 DIAGNOSIS — I748 Embolism and thrombosis of other arteries: Secondary | ICD-10-CM | POA: Diagnosis not present

## 2016-06-11 DIAGNOSIS — A408 Other streptococcal sepsis: Secondary | ICD-10-CM | POA: Diagnosis not present

## 2016-06-11 DIAGNOSIS — I330 Acute and subacute infective endocarditis: Secondary | ICD-10-CM | POA: Diagnosis present

## 2016-06-11 DIAGNOSIS — Z85818 Personal history of malignant neoplasm of other sites of lip, oral cavity, and pharynx: Secondary | ICD-10-CM | POA: Diagnosis not present

## 2016-06-11 DIAGNOSIS — S0083XA Contusion of other part of head, initial encounter: Secondary | ICD-10-CM | POA: Diagnosis not present

## 2016-06-11 DIAGNOSIS — I63413 Cerebral infarction due to embolism of bilateral middle cerebral arteries: Secondary | ICD-10-CM | POA: Diagnosis not present

## 2016-06-11 DIAGNOSIS — Z9889 Other specified postprocedural states: Secondary | ICD-10-CM | POA: Diagnosis not present

## 2016-06-11 DIAGNOSIS — T07XXXA Unspecified multiple injuries, initial encounter: Secondary | ICD-10-CM

## 2016-06-11 DIAGNOSIS — R509 Fever, unspecified: Secondary | ICD-10-CM

## 2016-06-11 DIAGNOSIS — R451 Restlessness and agitation: Secondary | ICD-10-CM | POA: Diagnosis not present

## 2016-06-11 DIAGNOSIS — Z86711 Personal history of pulmonary embolism: Secondary | ICD-10-CM | POA: Diagnosis not present

## 2016-06-11 DIAGNOSIS — Y831 Surgical operation with implant of artificial internal device as the cause of abnormal reaction of the patient, or of later complication, without mention of misadventure at the time of the procedure: Secondary | ICD-10-CM | POA: Diagnosis present

## 2016-06-11 DIAGNOSIS — S3993XA Unspecified injury of pelvis, initial encounter: Secondary | ICD-10-CM | POA: Diagnosis not present

## 2016-06-11 DIAGNOSIS — E876 Hypokalemia: Secondary | ICD-10-CM | POA: Diagnosis not present

## 2016-06-11 DIAGNOSIS — T826XXS Infection and inflammatory reaction due to cardiac valve prosthesis, sequela: Secondary | ICD-10-CM | POA: Diagnosis not present

## 2016-06-11 DIAGNOSIS — M6281 Muscle weakness (generalized): Secondary | ICD-10-CM | POA: Diagnosis not present

## 2016-06-11 DIAGNOSIS — I110 Hypertensive heart disease with heart failure: Secondary | ICD-10-CM | POA: Diagnosis present

## 2016-06-11 DIAGNOSIS — S0003XA Contusion of scalp, initial encounter: Secondary | ICD-10-CM | POA: Diagnosis not present

## 2016-06-11 DIAGNOSIS — I63423 Cerebral infarction due to embolism of bilateral anterior cerebral arteries: Secondary | ICD-10-CM

## 2016-06-11 DIAGNOSIS — M549 Dorsalgia, unspecified: Secondary | ICD-10-CM | POA: Diagnosis not present

## 2016-06-11 DIAGNOSIS — D6862 Lupus anticoagulant syndrome: Secondary | ICD-10-CM | POA: Diagnosis not present

## 2016-06-11 DIAGNOSIS — T380X5A Adverse effect of glucocorticoids and synthetic analogues, initial encounter: Secondary | ICD-10-CM | POA: Diagnosis not present

## 2016-06-11 DIAGNOSIS — A419 Sepsis, unspecified organism: Secondary | ICD-10-CM | POA: Diagnosis not present

## 2016-06-11 DIAGNOSIS — I2510 Atherosclerotic heart disease of native coronary artery without angina pectoris: Secondary | ICD-10-CM | POA: Diagnosis not present

## 2016-06-11 DIAGNOSIS — R2689 Other abnormalities of gait and mobility: Secondary | ICD-10-CM | POA: Diagnosis not present

## 2016-06-11 HISTORY — DX: Malignant neoplasm of cheek mucosa: C06.0

## 2016-06-11 LAB — CBC WITH DIFFERENTIAL/PLATELET
BASOS PCT: 0 %
Basophils Absolute: 0 10*3/uL (ref 0.0–0.1)
EOS ABS: 0 10*3/uL (ref 0.0–0.7)
Eosinophils Relative: 0 %
HEMATOCRIT: 34.1 % — AB (ref 39.0–52.0)
Hemoglobin: 11.3 g/dL — ABNORMAL LOW (ref 13.0–17.0)
Lymphocytes Relative: 3 %
Lymphs Abs: 0.7 10*3/uL (ref 0.7–4.0)
MCH: 30.5 pg (ref 26.0–34.0)
MCHC: 33.1 g/dL (ref 30.0–36.0)
MCV: 91.9 fL (ref 78.0–100.0)
MONO ABS: 1.4 10*3/uL — AB (ref 0.1–1.0)
Monocytes Relative: 7 %
NEUTROS ABS: 19.2 10*3/uL — AB (ref 1.7–7.7)
Neutrophils Relative %: 90 %
PLATELETS: 127 10*3/uL — AB (ref 150–400)
RBC: 3.71 MIL/uL — ABNORMAL LOW (ref 4.22–5.81)
RDW: 14 % (ref 11.5–15.5)
WBC: 21.3 10*3/uL — ABNORMAL HIGH (ref 4.0–10.5)

## 2016-06-11 LAB — I-STAT CHEM 8, ED
BUN: 21 mg/dL — ABNORMAL HIGH (ref 6–20)
Calcium, Ion: 1.13 mmol/L — ABNORMAL LOW (ref 1.15–1.40)
Chloride: 98 mmol/L — ABNORMAL LOW (ref 101–111)
Creatinine, Ser: 1.2 mg/dL (ref 0.61–1.24)
GLUCOSE: 100 mg/dL — AB (ref 65–99)
HEMATOCRIT: 32 % — AB (ref 39.0–52.0)
Hemoglobin: 10.9 g/dL — ABNORMAL LOW (ref 13.0–17.0)
Potassium: 4.4 mmol/L (ref 3.5–5.1)
Sodium: 132 mmol/L — ABNORMAL LOW (ref 135–145)
TCO2: 29 mmol/L (ref 0–100)

## 2016-06-11 LAB — I-STAT CG4 LACTIC ACID, ED
LACTIC ACID, VENOUS: 2.08 mmol/L — AB (ref 0.5–1.9)
LACTIC ACID, VENOUS: 1.01 mmol/L (ref 0.5–1.9)
Lactic Acid, Venous: 1.22 mmol/L (ref 0.5–1.9)

## 2016-06-11 LAB — BRAIN NATRIURETIC PEPTIDE: B NATRIURETIC PEPTIDE 5: 259 pg/mL — AB (ref 0.0–100.0)

## 2016-06-11 LAB — URINALYSIS, ROUTINE W REFLEX MICROSCOPIC
Bilirubin Urine: NEGATIVE
GLUCOSE, UA: NEGATIVE mg/dL
HGB URINE DIPSTICK: NEGATIVE
Ketones, ur: 5 mg/dL — AB
LEUKOCYTES UA: NEGATIVE
Nitrite: NEGATIVE
Protein, ur: NEGATIVE mg/dL
SPECIFIC GRAVITY, URINE: 1.014 (ref 1.005–1.030)
pH: 7 (ref 5.0–8.0)

## 2016-06-11 LAB — INFLUENZA PANEL BY PCR (TYPE A & B)
INFLAPCR: NEGATIVE
Influenza B By PCR: NEGATIVE

## 2016-06-11 LAB — PROTIME-INR
INR: 1.98
Prothrombin Time: 22.8 seconds — ABNORMAL HIGH (ref 11.4–15.2)

## 2016-06-11 LAB — CBG MONITORING, ED: GLUCOSE-CAPILLARY: 86 mg/dL (ref 65–99)

## 2016-06-11 MED ORDER — ACETAMINOPHEN 650 MG RE SUPP
650.0000 mg | Freq: Once | RECTAL | Status: AC
  Administered 2016-06-11: 650 mg via RECTAL
  Filled 2016-06-11: qty 1

## 2016-06-11 MED ORDER — ACETAMINOPHEN 325 MG PO TABS
650.0000 mg | ORAL_TABLET | Freq: Once | ORAL | Status: AC | PRN
Start: 2016-06-11 — End: 2016-06-11
  Administered 2016-06-11: 650 mg via ORAL
  Filled 2016-06-11: qty 2

## 2016-06-11 MED ORDER — ONDANSETRON HCL 4 MG/2ML IJ SOLN
4.0000 mg | Freq: Once | INTRAMUSCULAR | Status: AC
  Administered 2016-06-11: 4 mg via INTRAVENOUS
  Filled 2016-06-11: qty 2

## 2016-06-11 MED ORDER — SODIUM CHLORIDE 0.9 % IV SOLN
INTRAVENOUS | Status: DC
  Administered 2016-06-12: 02:00:00 via INTRAVENOUS

## 2016-06-11 MED ORDER — VANCOMYCIN HCL IN DEXTROSE 1-5 GM/200ML-% IV SOLN
1000.0000 mg | Freq: Once | INTRAVENOUS | Status: DC
  Filled 2016-06-11: qty 200

## 2016-06-11 MED ORDER — DEXAMETHASONE SODIUM PHOSPHATE 10 MG/ML IJ SOLN
10.0000 mg | Freq: Once | INTRAMUSCULAR | Status: AC
  Administered 2016-06-11: 10 mg via INTRAVENOUS
  Filled 2016-06-11: qty 1

## 2016-06-11 MED ORDER — SODIUM CHLORIDE 0.9 % IV BOLUS (SEPSIS)
500.0000 mL | Freq: Once | INTRAVENOUS | Status: AC
  Administered 2016-06-11: 500 mL via INTRAVENOUS

## 2016-06-11 MED ORDER — VANCOMYCIN HCL IN DEXTROSE 1-5 GM/200ML-% IV SOLN: 1000.0000 mg | Freq: Once | INTRAVENOUS | Status: DC

## 2016-06-11 MED ORDER — GADOBENATE DIMEGLUMINE 529 MG/ML IV SOLN
15.0000 mL | Freq: Once | INTRAVENOUS | Status: AC | PRN
  Administered 2016-06-11: 15 mL via INTRAVENOUS

## 2016-06-11 MED ORDER — CEFTRIAXONE SODIUM 2 G IJ SOLR: 2.0000 g | Freq: Once | INTRAMUSCULAR | Status: DC

## 2016-06-11 MED ORDER — PIPERACILLIN-TAZOBACTAM 3.375 G IVPB
3.3750 g | Freq: Once | INTRAVENOUS | Status: DC
  Administered 2016-06-11: 3.375 g via INTRAVENOUS
  Filled 2016-06-11: qty 50

## 2016-06-11 MED ORDER — SODIUM CHLORIDE 0.9 % IV SOLN
2.0000 g | INTRAVENOUS | Status: DC
  Administered 2016-06-12 (×2): 2 g via INTRAVENOUS
  Filled 2016-06-11 (×4): qty 2000

## 2016-06-11 MED ORDER — AMPICILLIN SODIUM 2 G IJ SOLR
2.0000 g | Freq: Once | INTRAMUSCULAR | Status: AC
  Administered 2016-06-11: 2 g via INTRAVENOUS
  Filled 2016-06-11: qty 2000

## 2016-06-11 MED ORDER — DEXTROSE 5 % IV SOLN
2.0000 g | Freq: Two times a day (BID) | INTRAVENOUS | Status: DC
  Administered 2016-06-11 – 2016-06-17 (×13): 2 g via INTRAVENOUS
  Filled 2016-06-11 (×16): qty 2

## 2016-06-11 MED ORDER — VANCOMYCIN HCL 10 G IV SOLR
1500.0000 mg | Freq: Once | INTRAVENOUS | Status: AC
  Administered 2016-06-11: 1500 mg via INTRAVENOUS
  Filled 2016-06-11: qty 1500

## 2016-06-11 MED ORDER — VANCOMYCIN HCL IN DEXTROSE 1-5 GM/200ML-% IV SOLN
1000.0000 mg | Freq: Two times a day (BID) | INTRAVENOUS | Status: DC
  Filled 2016-06-11: qty 200

## 2016-06-11 MED ORDER — MORPHINE SULFATE (PF) 4 MG/ML IV SOLN
4.0000 mg | Freq: Once | INTRAVENOUS | Status: AC
  Administered 2016-06-11: 4 mg via INTRAVENOUS
  Filled 2016-06-11: qty 1

## 2016-06-11 NOTE — ED Provider Notes (Signed)
[facility] DEPT Provider Note   CSN: [id number] Arrival date & time: 06/11/16  0944     History   Chief Complaint Chief Complaint  Patient presents with  . Fall  . Weakness    HPI Peter Bruce is a 66 y.o. male.  He presents for evaluation of leg weakness, low back pain and inability to walk. Symptoms progressive over the last 5 days. He has had some falls due to general weakness over the last couple of months, without specific injuries. His low back pain was assessed by orthopedics, with plain images and physical examination and he was treated symptomatically, several weeks ago. 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 He has been scheduled for PET scan, but it has not been done yet. He has had low back pain for years. He is a former Electrical engineer. The patient denies having a fever recently. He has not had rhinorrhea, cough, nausea or vomiting. He has had decreased appetite for several days. Apparently unable to walk because of the pain in his low back and weak legs, for several days. He has been staying in bed and using a urinal when he needs to void. There are no other known modifying factors.  HPI  Past Medical History:  Diagnosis Date  . Acute pulmonary embolus ([place]) 2010  . Arthritis   . Atrial fibrillation [facility])    s/p inpatient DCCV 09/11/2015  . Atrial flutter ([place])   . CHF (congestive heart failure) ([place])   . Coronary artery disease involving native coronary artery 05/15/2015   multivessel  . Dysrhythmia    A-fib  . Essential hypertension   . Fibromyalgia   . History of kidney stones   . Hypercholesterolemia   . Hypertension   . Lupus anticoagulant positive 07/29/2008  . Mitral regurgitation 10/30/2015  . PMR (polymyalgia rheumatica) ([facility])   . Pneumonia   . 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 S/P Maze operation for atrial fibrillation 05/29/2015   Complete bilateral atrial lesion set via median sternotomy using bipolar radiofrequency and cryothermy ablation with clipping of LA appendage  . 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 S/P minimally invasive mitral valve repair 11/21/2015  . Essential hypertension   . Atrial fibrillation ([place]) 11/06/2015  . Acute CHF (congestive heart failure) ([place]) 11/01/2015  . CHF (congestive heart failure) ([place]) 11/01/2015  . Mitral regurgitation 10/30/2015  . Fatigue 10/14/2015  . Current use of long term anticoagulation 09/27/2015  . Bilateral pleural effusion 09/27/2015  . Acute on chronic diastolic heart failure ([place]) 09/27/2015  . Acute on chronic diastolic (congestive) heart failure 09/27/2015  . Respiratory failure with hypoxia ([place]) 09/09/2015  . Acute respiratory failure with hypoxia ([place]) 09/09/2015  . Atrial flutter ([place])   . S/P CABG x 3 and maze procedure 05/29/2015  . Coronary artery disease involving native coronary artery 05/15/2015  . Dyslipidemia 04/14/2015  . Polymyalgia rheumatica--rheumatol [place]-2016 12/19/2013  . Obesity (BMI 30-39.9)   . BPH (benign prostatic hyperplasia) 08/30/2011  . Paroxysmal atrial fibrillation ([place]) 08/30/2011  . Chronic pain 08/30/2011  . Anxiety 04/21/2011  .  History of pulmonary embolism 10/02/2008  . Lupus anticoagulant positive 07/29/2008    Past Surgical History:  Procedure Laterality Date  . APPENDECTOMY    . arm surgery  Right    wrist and elbow  . CARDIAC CATHETERIZATION N/A 05/15/2015   Procedure: Left Heart Cath and Coronary Angiography;  Surgeon: Peter M Martinique, MD;  Location: [facility] CV LAB;  Service: Cardiovascular;  Laterality: N/A;  . CARDIAC  CATHETERIZATION N/A 11/17/2015   Procedure: Right/Left Heart Cath and Coronary/Graft Angiography;  Surgeon: Adrian Prows, MD;  Location: [place];  Service: Cardiovascular;  Laterality: N/A;  . CARDIOVERSION N/A 09/11/2015   Procedure: CARDIOVERSION;  Surgeon: Lelon Perla, MD;  Location: [facility] ENDOSCOPY;  Service: Cardiovascular;  Laterality: N/A;  . CHEST TUBE INSERTION  11/21/2015   Procedure: LEFT CHEST TUBE INSERTION;  Surgeon: Rexene Alberts, MD;  Location: [place];  Service: Open Heart Surgery;;  . CLIPPING OF ATRIAL APPENDAGE  05/29/2015   Procedure: CLIPPING OF LEFT ATRIAL APPENDAGE;  Surgeon: Rexene Alberts, MD;  Location: [place];  Service: Open Heart Surgery;;  45 AtriClip PRO 145  . CORONARY ARTERY BYPASS GRAFT N/A 05/29/2015   Procedure: CORONARY ARTERY BYPASS GRAFTING (CABG), ON PUMP, TIMES THREE, USING LEFT INTERNAL MAMMARY ARTERY, BILATERAL GREATER SAPHENOUS VEINS HARVESTED ENDOSCOPICALLY;  Surgeon: Rexene Alberts, MD;  Location: [facility];  Service: Open Heart Surgery;  Laterality: N/A;  . FRACTURE SURGERY     right wrist  . HERNIA REPAIR    . MAZE N/A 05/29/2015   Procedure: MAZE;  Surgeon: Rexene Alberts, MD;  Location: [place];  Service: Open Heart Surgery;  Laterality: N/A;  Complete Bi-Atrial Lesion set with Ablation and Cryothermy  . MITRAL VALVE REPAIR Right 11/21/2015   Procedure: MINIMALLY INVASIVE REOPERATION FOR MITRAL VALVE REPAIR;  Surgeon: Rexene Alberts, MD;  Location: [place];  Service: Open Heart Surgery;  Laterality: Right;  . PERIPHERAL VASCULAR CATHETERIZATION Bilateral 11/17/2015   Procedure: Renal Angiography;  Surgeon: Adrian Prows, MD;  Location: [place] CV LAB;  Service: Cardiovascular;  Laterality: Bilateral;  . PERIPHERAL VASCULAR CATHETERIZATION N/A 11/17/2015   Procedure: Abdominal Aortogram;  Surgeon: Adrian Prows, MD;  Location: [facility] LAB;  Service: Cardiovascular;  Laterality: N/A;  . SHOULDER SURGERY Left    clavicular fracture  . TEE WITHOUT  CARDIOVERSION N/A 05/29/2015   Procedure: TRANSESOPHAGEAL ECHOCARDIOGRAM (TEE);  Surgeon: Rexene Alberts, MD;  Location: [place];  Service: Open Heart Surgery;  Laterality: N/A;  . TEE WITHOUT CARDIOVERSION N/A 10/30/2015   Procedure: TRANSESOPHAGEAL ECHOCARDIOGRAM (TEE);  Surgeon: Adrian Prows, MD;  Location: [place];  Service: Cardiovascular;  Laterality: N/A;  . TEE WITHOUT CARDIOVERSION N/A 11/21/2015   Procedure: TRANSESOPHAGEAL ECHOCARDIOGRAM (TEE);  Surgeon: Rexene Alberts, MD;  Location: [place];  Service: Open Heart Surgery;  Laterality: N/A;       Home Medications    Prior to Admission medications   Medication Sig Start Date End Date Taking? Authorizing Provider  acetaminophen (TYLENOL) 500 MG tablet Take 1,000 mg by mouth every 8 (eight) hours as needed for mild pain or moderate pain.    Historical Provider, MD  diazepam (VALIUM) 10 MG tablet Take 10 mg by mouth every 6 (six) hours as needed for anxiety.    Historical Provider, MD  metoprolol succinate (TOPROL-XL) 25 MG 24 hr tablet Take 0.5 tablets (12.5 mg total) by mouth daily. 12/15/15   Rexene Alberts, MD  rosuvastatin (CRESTOR) 20 MG tablet  Take 1 tablet (20 mg total) by mouth daily at 6 PM. 12/01/15   Erin R Barrett, PA-C  valsartan (DIOVAN) 160 MG tablet Take 1 tablet (160 mg total) by mouth daily. 11/19/15   Adrian Prows, MD  warfarin (COUMADIN) 7.5 MG tablet Take 1 tablet (7.5 mg total) by mouth daily at 6 PM. 12/01/15   Erin R Barrett, PA-C  zolpidem (AMBIEN) 10 MG tablet Take 10 mg by mouth daily.     Historical Provider, MD    Family History Family History  Problem Relation Age of Onset  . Cancer Mother   . Other Sister     chf    Social History Social History  Substance Use Topics  . Smoking status: Never Smoker  . Smokeless tobacco: Never Used  . Alcohol use No     Allergies   Amitriptyline and Serotonin reuptake inhibitors (ssris)   Review of Systems Review of Systems  All other systems reviewed and are  negative.    Physical Exam Updated Vital Signs BP 109/72   Pulse 97   Temp 102.1 F (38.9 C) (Rectal)   Resp 18   Ht 5\' 10"  (1.778 m)   Wt 170 lb (77.1 kg)   SpO2 97%   BMI 24.39 kg/m   Physical Exam  Constitutional: He is oriented to person, place, and time. He appears well-developed. No distress.  Elderly, frail  HENT:  Head: Normocephalic and atraumatic.  Right Ear: External ear normal.  Left Ear: External ear normal.  Right posterior pharyngeal scarring consistent with recent biopsy. His voice is raspy. There is no stridor.  Eyes: Conjunctivae and EOM are normal. Pupils are equal, round, and reactive to light.  Neck: Normal range of motion and phonation normal. Neck supple.  Cardiovascular: Normal rate, regular rhythm and normal heart sounds.   Pulmonary/Chest: Effort normal and breath sounds normal. No respiratory distress. He exhibits no bony tenderness.  Abdominal: Soft. There is no tenderness.  Musculoskeletal: Normal range of motion.  The back is nontender to palpation. There is pain in the low back with left straight leg raising above 45. Strength in arms and legs is normal.  Neurological: He is alert and oriented to person, place, and time. No cranial nerve deficit or sensory deficit. He exhibits normal muscle tone. Coordination normal.  Skin: Skin is warm, dry and intact.  Psychiatric: He has a normal mood and affect. His behavior is normal. Judgment and thought content normal.  Nursing note and vitals reviewed.    ED Treatments / Results  Labs (all labs ordered are listed, but only abnormal results are displayed) Labs Reviewed  CBC WITH DIFFERENTIAL/PLATELET - Abnormal; Notable for the following:       Result Value   WBC 21.3 (*)    RBC 3.71 (*)    Hemoglobin 11.3 (*)    HCT 34.1 (*)    Neutro Abs 19.2 (*)    Monocytes Absolute 1.4 (*)    All other components within normal limits  I-STAT CG4 LACTIC ACID, ED - Abnormal; Notable for the following:     Lactic Acid, Venous 2.08 (*)    All other components within normal limits  CULTURE, BLOOD (ROUTINE X 2)  CULTURE, BLOOD (ROUTINE X 2)  URINE CULTURE  URINALYSIS, ROUTINE W REFLEX MICROSCOPIC  CBG MONITORING, ED    EKG  EKG Interpretation None       Radiology Dg Chest 1 View  Result Date: 06/11/2016 CLINICAL DATA:  Weakness.  Cough. EXAM: CHEST  1 VIEW COMPARISON:  12/15/2015 . FINDINGS: Prior CABG. Left atrial appendage clip noted. Cardiomegaly with bilateral mild interstitial prominence consistent mild CHF. No pleural effusion noted on today's exam. No pneumothorax. IMPRESSION: Prior CABG. Mild pulmonary interstitial prominence consistent with mild CHF . Electronically Signed   By: Marcello Moores  Register   On: 06/11/2016 11:03    Procedures Procedures (including critical care time)  Medications Ordered in ED Medications  acetaminophen (TYLENOL) tablet 650 mg (650 mg Oral Given 06/11/16 1024)     Initial Impression / Assessment and Plan / ED Course  I have reviewed the triage vital signs and the nursing notes.  Pertinent labs & imaging results that were available during my care of the patient were reviewed by me and considered in my medical decision making (see chart for details).  Clinical Course as of Jun 12 1515  Fri Jun 11, 2016  1446 Patient is more alert now, talkative and comfortable. He states that he hurt his left hip region, and when he fell several days ago. Also, at that time, he bumped his forehead on the right. MRI spine pending to evaluate for space-occupying lesions, which could cause a myelopathy. Will add pelvic x-ray to evaluate for possible pelvic fracture. Flu test negative, will DC isolation precautions.  [EW]    Clinical Course User Index [EW] Daleen Bo, MD    Medications  acetaminophen (TYLENOL) tablet 650 mg (650 mg Oral Given 06/11/16 1024)  sodium chloride 0.9 % bolus 500 mL (0 mLs Intravenous Stopped 06/11/16 1429)    Patient Vitals for the past 24  hrs:  BP Temp Temp src Pulse Resp SpO2 Height Weight  06/11/16 1445 115/82 - - 82 18 96 % - -  06/11/16 1444 - 97.4 F (36.3 C) Oral - - - - -  06/11/16 1400 116/77 - - 84 20 96 % - -  06/11/16 1330 105/68 - - 87 17 98 % - -  06/11/16 1300 118/78 - - 85 - 94 % - -  06/11/16 1245 106/68 - - 83 12 98 % - -  06/11/16 1215 101/64 - - 88 15 96 % - -  06/11/16 1030 109/72 - - 97 18 97 % - -  06/11/16 1026 - - - - - - 5\' 10"  (1.778 m) 170 lb (77.1 kg)  06/11/16 1017 - 102.1 F (38.9 C) Rectal - - - - -  06/11/16 1006 115/76 100.2 F (37.9 C) Oral 99 26 97 % - -  06/11/16 1003 115/76 - - 99 26 95 % - -    3:18 PM Reevaluation with update and discussion. After initial assessment and treatment, an updated evaluation reveals He remains comfortable and is awaiting completed testing to evaluate for serious medical problems. 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 He required workup to evaluate for spinal myelopathy.Daleen Bo L    Final Clinical Impressions(s) / ED Diagnoses   Final diagnoses:  Weakness  Contusion, multiple sites    Nonspecific low back pain with leg weakness, and falls. Nonspecific febrile illness, with normal lactate at 1454. Influenza testing negative. Patient is being followed closely by his ENT physician and has been referred to oncology who plans on doing a PET scan. No clinical evidence for serious infectious process. Mild weakness legs, left greater than right, which is possibly pain mediated.  Nursing Notes Reviewed/ Care Coordinated, and agree without changes. Applicable Imaging Reviewed.  Interpretation of Laboratory Data incorporated into ED treatment  Plan- likely DC home,  if spine imaging is normal.  New Prescriptions New Prescriptions   No medications on file     Daleen Bo, MD 06/14/16 1102

## 2016-06-11 NOTE — ED Notes (Signed)
Patient transported to X-ray then CT °

## 2016-06-11 NOTE — ED Notes (Signed)
Called MRI and notified them pt is agreeing to go to MRI.  They stated they will come and pick up pt next.

## 2016-06-11 NOTE — ED Triage Notes (Addendum)
Pt from home via [facility] with c/o increased weakness and confusion for over a week.  Pt wife denies any other complaints, no cough, fever, sore throat, runny nose, etc.  Pt wife states pt has had multiple falls this week and has c/o neck pain from hitting his head earlier this week and worsening lower chronic back pain.  Hx of generalized weakness intermittent x months.  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  Pt has not had a full PET scan yet.  NAD, alert.

## 2016-06-11 NOTE — ED Notes (Signed)
Lactic acid was shown to dr.wentz

## 2016-06-11 NOTE — ED Notes (Signed)
MD made aware that pt. had a rectal temperature of 103.1.

## 2016-06-11 NOTE — H&P (Signed)
History and Physical  Patient Name: Peter Bruce     [id number]    DOB: [birth year]    DOA: 06/11/2016 PCP: Adrian Prows, MD   Patient coming from: Home  Chief Complaint: Fever, confusion  HPI: Peter Bruce is a 66 y.o. male with a past medical history significant for lupus AC and hx of PE on warfarin, PMR on pred 5, Atrial fibrillation, CAD, HTN, and recent diagnosis of tongue CA who presents with fever and intermittent confusion and leg weakness.  Caveat that the patient is confused on my exam and all history collected from wife by phone.  According to her, the patient developed a brief but severe episode of low back pain and leg weakness a few weeks ago, treated by his chiropractor, then resolved.  He was also then diagnosed very recently with tongue cancer.  Now, in the last 4-5 days, his wife describes that he has been complaining of back pain again, and also falling repeatedly, and intermittently confused.  Today, his symptoms seemed to be worse, so she called an ambulance.  He has had no fevers or sweats at home that she knows of.  +cough, no urinary symptoms, skin lesions, abdominal pain, vomiting.  ED course: -Temp 102.35F, heart rate 99, respirations 26, BP 115/76, pulse ox normal -Na 132, K 4.4, Cr 1.2 (baseline 1.0-1.2), WBC 21.3K, Hgb 11.3, platelets 127 -Lactic acid 2.08 --> 1 -INR 1.98 -Influenza negative -CXR showed mild CHF, but no focal pneumonia -UA clear -CT of the head was obtained that showed a right mastoid effusion -MRI of the axial skeleton was obtained to rule out cauda equina syndrome and this was unremarkable -Blood cultures were obtained, empiric vancomycin and zosyn were administered and TRH were asked to evaluate for sepsis, unclear source  Wife notes patient's sister was just admitted to [facility] hospital this week for [facility].         ROS: Review of Systems  Constitutional: Positive for fever and malaise/fatigue.  HENT: Negative for congestion, ear  discharge, ear pain and hearing loss.   Respiratory: Positive for cough. Negative for sputum production, shortness of breath and wheezing.   Musculoskeletal: Positive for back pain, falls and neck pain.  Neurological: Positive for weakness. Negative for dizziness, sensory change, speech change, focal weakness, seizures, loss of consciousness and headaches.  All other systems reviewed and are negative.         Past Medical History:  Diagnosis Date  . Acute pulmonary embolus ([place]) 2010  . Arthritis   . Atrial fibrillation [facility])    s/p inpatient DCCV 09/11/2015  . Atrial flutter ([place])   . CHF (congestive heart failure) ([place])   . Coronary artery disease involving native coronary artery 05/15/2015   multivessel  . Dysrhythmia    A-fib  . Essential hypertension   . Fibromyalgia   . History of kidney stones   . Hypercholesterolemia   . Hypertension   . Lupus anticoagulant positive 07/29/2008  . Mitral regurgitation 10/30/2015  . PMR (polymyalgia rheumatica) (HCC)   . Pneumonia   . 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 S/P Maze operation for atrial fibrillation 05/29/2015   Complete bilateral atrial lesion set via median sternotomy using bipolar radiofrequency and cryothermy ablation with clipping of LA appendage  . S/P minimally invasive mitral valve repair 11/21/2015   Complex valvuloplasty including artificial Gore-tex neochord placement x12 with 32  mm Sorin Memo 3D ring annuloplasty via right mini thoracotomy approach  . 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 APPENDECTOMY    . arm surgery  Right    wrist and elbow  . CARDIAC CATHETERIZATION N/A 05/15/2015   Procedure: Left Heart Cath and Coronary Angiography;  Surgeon: Peter M Martinique, MD;  Location: [place] CV LAB;  Service: Cardiovascular;  Laterality: N/A;  . CARDIAC CATHETERIZATION N/A 11/17/2015    Procedure: Right/Left Heart Cath and Coronary/Graft Angiography;  Surgeon: Adrian Prows, MD;  Location: [place] CV LAB;  Service: Cardiovascular;  Laterality: N/A;  . CARDIOVERSION N/A 09/11/2015   Procedure: CARDIOVERSION;  Surgeon: Lelon Perla, MD;  Location: [facility] ENDOSCOPY;  Service: Cardiovascular;  Laterality: N/A;  . CHEST TUBE INSERTION  11/21/2015   Procedure: LEFT CHEST TUBE INSERTION;  Surgeon: Rexene Alberts, MD;  Location: [place];  Service: Open Heart Surgery;;  . CLIPPING OF ATRIAL APPENDAGE  05/29/2015   Procedure: CLIPPING OF LEFT ATRIAL APPENDAGE;  Surgeon: Rexene Alberts, MD;  Location: [place];  Service: Open Heart Surgery;;  45 AtriClip PRO 145  . CORONARY ARTERY BYPASS GRAFT N/A 05/29/2015   Procedure: CORONARY ARTERY BYPASS GRAFTING (CABG), ON PUMP, TIMES THREE, USING LEFT INTERNAL MAMMARY ARTERY, BILATERAL GREATER SAPHENOUS VEINS HARVESTED ENDOSCOPICALLY;  Surgeon: Rexene Alberts, MD;  Location: [place];  Service: Open Heart Surgery;  Laterality: N/A;  . FRACTURE SURGERY     right wrist  . HERNIA REPAIR    . MAZE N/A 05/29/2015   Procedure: MAZE;  Surgeon: Rexene Alberts, MD;  Location: [place];  Service: Open Heart Surgery;  Laterality: N/A;  Complete Bi-Atrial Lesion set with Ablation and Cryothermy  . MITRAL VALVE REPAIR Right 11/21/2015   Procedure: MINIMALLY INVASIVE REOPERATION FOR MITRAL VALVE REPAIR;  Surgeon: Rexene Alberts, MD;  Location: [place];  Service: Open Heart Surgery;  Laterality: Right;  . PERIPHERAL VASCULAR CATHETERIZATION Bilateral 11/17/2015   Procedure: Renal Angiography;  Surgeon: Adrian Prows, MD;  Location: [place] CV LAB;  Service: Cardiovascular;  Laterality: Bilateral;  . PERIPHERAL VASCULAR CATHETERIZATION N/A 11/17/2015   Procedure: Abdominal Aortogram;  Surgeon: Adrian Prows, MD;  Location: [place] CV LAB;  Service: Cardiovascular;  Laterality: N/A;  . SHOULDER SURGERY Left    clavicular fracture  . TEE WITHOUT CARDIOVERSION N/A 05/29/2015    Procedure: TRANSESOPHAGEAL ECHOCARDIOGRAM (TEE);  Surgeon: Rexene Alberts, MD;  Location: [place];  Service: Open Heart Surgery;  Laterality: N/A;  . TEE WITHOUT CARDIOVERSION N/A 10/30/2015   Procedure: TRANSESOPHAGEAL ECHOCARDIOGRAM (TEE);  Surgeon: Adrian Prows, MD;  Location: [place];  Service: Cardiovascular;  Laterality: N/A;  . TEE WITHOUT CARDIOVERSION N/A 11/21/2015   Procedure: TRANSESOPHAGEAL ECHOCARDIOGRAM (TEE);  Surgeon: Rexene Alberts, MD;  Location: [place];  Service: Open Heart Surgery;  Laterality: N/A;    Social History: Patient lives with his wife.  The patient walks unassisted.  He is not a smoker.    Allergies  Allergen Reactions  . Amitriptyline Shortness Of Breath  . Serotonin Reuptake Inhibitors (Ssris) Nausea And Vomiting    SSRI uptake makes him extremely sick.    Family history: family history includes Cancer in his mother; Other in his sister.  Prior to Admission medications   Medication Sig Start Date End Date Taking? Authorizing Provider  acetaminophen (TYLENOL) 500 MG tablet Take 1,000 mg by mouth every 8 (eight) hours as needed  for mild pain or moderate pain.   Yes Historical Provider, MD  amLODipine (NORVASC) 10 MG tablet Take 10 mg by mouth daily.   Yes Historical Provider, MD  Ascorbic Acid (VITAMIN C) 100 MG tablet Take 100 mg by mouth daily.   Yes Historical Provider, MD  metoprolol succinate (TOPROL-XL) 25 MG 24 hr tablet Take 0.5 tablets (12.5 mg total) by mouth daily. 12/15/15  Yes Rexene Alberts, MD  Multiple Vitamin (MULTIVITAMIN) tablet Take 1 tablet by mouth daily.   Yes Historical Provider, MD  predniSONE (DELTASONE) 5 MG tablet Take 5 mg by mouth daily. 04/06/16  Yes Historical Provider, MD  valsartan (DIOVAN) 160 MG tablet Take 1 tablet (160 mg total) by mouth daily. 11/19/15  Yes Adrian Prows, MD  warfarin (COUMADIN) 7.5 MG tablet Take 1 tablet (7.5 mg total) by mouth daily at 6 PM. Patient taking differently: Take 5-7.5 mg by mouth See admin  instructions. Pt takes 5mg  by mouth once daily in the evening except for Wednesdays, 7.5mg  by mouth Wednesdays 12/01/15  Yes Erin R Barrett, PA-C  zolpidem (AMBIEN) 10 MG tablet Take 10 mg by mouth daily.    Yes Historical Provider, MD  rosuvastatin (CRESTOR) 20 MG tablet Take 1 tablet (20 mg total) by mouth daily at 6 PM. Patient not taking: Reported on 06/11/2016 12/01/15   Lodema Hong Barrett, PA-C       Physical Exam: BP 133/81   Pulse 103   Temp (!) 103.1 F (39.5 C) (Rectal)   Resp 20   Ht 5\' 10"  (1.778 m)   Wt 77.1 kg (170 lb)   SpO2 98%   BMI 24.39 kg/m  General appearance: Adult male, sweaty, awake but confused.     Eyes: Anicteric, conjunctiva pink, lids and lashes normal. PERRL.    ENT: No nasal deformity, discharge, epistaxis.  Hearing normal.  Left TM normal, right red and bulging. OP moist without lesions.  No lip ulcers.  No mastoid bogginess. Neck: No neck masses that I appreciate.  Trachea midline.  No thyromegaly/tenderness.  Neck stiff, won't turn to side, will not flex. Skin: Hot and diaphoretic.  No jaundice.  No suspicious rashes or lesions. Cardiac: Tachycardic, nl S1-S2, no murmurs appreciated.  Capillary refill is brisk no mottling.  JVP normal.  No LE edema.  Radial and DP pulses 2+ and symmetric. Respiratory: Normal respiratory rate and rhythm.  CTAB without rales or wheezes. Abdomen: Abdomen soft.  No TTP. No ascites, distension, hepatosplenomegaly.   MSK: No deformities or effusions.  No cyanosis or clubbing. Neuro: Cranial nerves appear grossly symmetric, but limited by patient inability to follow commands.  Sensation intact to light touch. Speech is slow and seems slurred, states it is his normal.  Muscle strength 5/5 in bilateral upper extremities, 5-/5 and symmetric in lower extremities, very limited by patient confusion and inability to follow instructions, overall tone seems high.   Patellar reflexes reduced.  Babinski down.   Psych: Responds to questions but  somewhat disoriented. States "[place]" as location, but first responds with nonsequiturs.  When asked what month it is, states "Monday".  Unable to articulate what happened this week.     Labs on Admission:  I have personally reviewed following labs and imaging studies: CBC:  Recent Labs Lab 06/11/16 1035 06/11/16 1933  WBC 21.3*  --   NEUTROABS 19.2*  --   HGB 11.3* 10.9*  HCT 34.1* 32.0*  MCV 91.9  --   PLT 127*  --  Basic Metabolic Panel:  Recent Labs Lab 06/11/16 1933  NA 132*  K 4.4  CL 98*  GLUCOSE 100*  BUN 21*  CREATININE 1.20   GFR: Estimated Creatinine Clearance: 61.7 mL/min (by C-G formula based on SCr of 1.2 mg/dL).  Liver Function Tests: No results for input(s): AST, ALT, ALKPHOS, BILITOT, PROT, ALBUMIN in the last 168 hours. No results for input(s): LIPASE, AMYLASE in the last 168 hours. No results for input(s): AMMONIA in the last 168 hours. Coagulation Profile:  Recent Labs Lab 06/11/16 1219  INR 1.98   Cardiac Enzymes: No results for input(s): CKTOTAL, CKMB, CKMBINDEX, TROPONINI in the last 168 hours. BNP (last 3 results) No results for input(s): PROBNP in the last 8760 hours. HbA1C: No results for input(s): HGBA1C in the last 72 hours. CBG:  Recent Labs Lab 06/11/16 1011  GLUCAP 86   Lipid Profile: No results for input(s): CHOL, HDL, LDLCALC, TRIG, CHOLHDL, LDLDIRECT in the last 72 hours. Thyroid Function Tests: No results for input(s): TSH, T4TOTAL, FREET4, T3FREE, THYROIDAB in the last 72 hours. Anemia Panel: No results for input(s): VITAMINB12, FOLATE, FERRITIN, TIBC, IRON, RETICCTPCT in the last 72 hours. Sepsis Labs: Lactic acid 2.08 --> 1.0 Invalid input(s): PROCALCITONIN, LACTICIDVEN No results found for this or any previous visit (from the past 240 hour(s)).       Radiological Exams on Admission: Personally reviewed: Dg Chest 1 View  Result Date: 06/11/2016 CLINICAL DATA:  Weakness.  Cough. EXAM: CHEST 1 VIEW  COMPARISON:  12/15/2015 . FINDINGS: Prior CABG. Left atrial appendage clip noted. Cardiomegaly with bilateral mild interstitial prominence consistent mild CHF. No pleural effusion noted on today's exam. No pneumothorax. IMPRESSION: Prior CABG. Mild pulmonary interstitial prominence consistent with mild CHF . Electronically Signed   By: Marcello Moores  Register   On: 06/11/2016 11:03   Dg Pelvis 1-2 Views  Result Date: 06/11/2016 CLINICAL DATA:  Pelvic pain.  Fall . EXAM: PELVIS - 1-2 VIEW COMPARISON:  CT 11/12/2015 . FINDINGS: Degenerative changes lumbar spine and both hips. No acute bony or joint abnormality identified. Clips in staples noted. Pelvic calcifications consistent phleboliths. IMPRESSION: Degenerative changes lumbar spine and both hips. No acute abnormality. Electronically Signed   By: Marcello Moores  Register   On: 06/11/2016 15:24   Ct Head Wo Contrast  Result Date: 06/11/2016 CLINICAL DATA:  Multiple falls recently. EXAM: CT HEAD WITHOUT CONTRAST TECHNIQUE: Contiguous axial images were obtained from the base of the skull through the vertex without intravenous contrast. COMPARISON:  None. FINDINGS: Brain: Patchy areas of low attenuation within the bilateral subcortical white matter identified compatible with chronic small vessel ischemic change. Prominence of the sulci and ventricles identified compatible with brain atrophy. There is no abnormal extra-axial fluid collection, intracranial hemorrhage or mass. No acute brain infarct. Vascular: No hyperdense vessel or unexpected calcification. Skull: Normal. Negative for fracture or focal lesion. Sinuses/Orbits: The paranasal sinuses are clear. There is partial opacification of the right mastoid air cells. Other: None. IMPRESSION: 1. No acute intracranial abnormalities. 2. Small vessel ischemic change and brain atrophy. 3. Partial opacification of the right mastoid air cells. Electronically Signed   By: Kerby Moors M.D.   On: 06/11/2016 11:12   Mr Cervical  Spine W Or Wo Contrast  Result Date: 06/11/2016 CLINICAL DATA:  Leg weakness. History of recent tongue cancer diagnosis. EXAM: MRI TOTAL SPINE WITHOUT AND WITH CONTRAST TECHNIQUE: Multisequence MR imaging of the spine from the cervical spine to the sacrum was performed prior to and following IV contrast  administration for evaluation of spinal metastatic disease. Multiplanar and multiecho pulse sequences of the thoracic and lumbar spine were obtained without and with intravenous contrast. Study is a metastatic screening survey anal E sagittal acquisitions were obtained. CONTRAST:  65mL MULTIHANCE GADOBENATE DIMEGLUMINE 529 MG/ML IV SOLN COMPARISON:  None. FINDINGS: MRI CERVICAL SPINE FINDINGS Alignment: No traumatic malalignment. Chronic C7-T1 anterolisthesis, facet mediated. Vertebrae: No fracture, evidence of discitis, or bone lesion. Cord:  Normal signal and morphology. Paraspinal and other soft tissues: The patient's tongue base/vallecular mass is partially visualized. Prevertebral fluid noted from C3-C4 without avid enhancement. This was not clearly seen on recent neck CT. There is disc T2 hyperintensity at C3-4 which appears continuous with the prevertebral fluid on one image. This may reflect an injury in this patient with multiple falls. No marrow edema or erosion to suggest discitis. No posterior element injury noted. Disc levels: Lower cervical degenerative changes. Upper cervical predominant facet arthropathy. Spinal canal is patent. Foraminal stenoses not well evaluated without axials. MRI THORACIC SPINE FINDINGS Alignment:  Exaggerated thoracic kyphosis. Vertebrae: No evidence of metastasis, fracture, or discitis. Cord: Normal signal and morphology. No abnormal intrathecal enhancement. Paraspinal and other soft tissues: No acute finding Disc levels: Spondylosis. Patent spinal canal. No evidence of foraminal impingement. MRI LUMBAR SPINE FINDINGS Segmentation:  Standard. Alignment: Exaggerated lordosis.  L2-3 translation and retrolisthesis. L4-5 anterolisthesis. Vertebrae:  No evidence of metastasis, acute fracture, or discitis. Conus medullaris: Extends to the L1-2 level and appears normal. Paraspinal and other soft tissues: Extensive colonic diverticulosis. Disc levels: Diffuse facet arthropathy and degenerative changes. Diffusely patent spinal canal. No high-grade foraminal impingement. IMPRESSION: 1. Negative for spinal metastasis or fracture. Diffusely patent spinal canal. 2. Small volume retropharyngeal fluid. The C3-4 disc is also T2 hyperintense. These changes could be posttraumatic in this patient with history of multiple falls; no evidence of acute fracture. No marrow edema as would be expected for discitis. Electronically Signed   By: Monte Fantasia M.D.   On: 06/11/2016 20:15   Mr Thoracic Spine W Wo Contrast  Result Date: 06/11/2016 CLINICAL DATA:  Leg weakness. History of recent tongue cancer diagnosis. EXAM: MRI TOTAL SPINE WITHOUT AND WITH CONTRAST TECHNIQUE: Multisequence MR imaging of the spine from the cervical spine to the sacrum was performed prior to and following IV contrast administration for evaluation of spinal metastatic disease. Multiplanar and multiecho pulse sequences of the thoracic and lumbar spine were obtained without and with intravenous contrast. Study is a metastatic screening survey anal E sagittal acquisitions were obtained. CONTRAST:  63mL MULTIHANCE GADOBENATE DIMEGLUMINE 529 MG/ML IV SOLN COMPARISON:  None. FINDINGS: MRI CERVICAL SPINE FINDINGS Alignment: No traumatic malalignment. Chronic C7-T1 anterolisthesis, facet mediated. Vertebrae: No fracture, evidence of discitis, or bone lesion. Cord:  Normal signal and morphology. Paraspinal and other soft tissues: The patient's tongue base/vallecular mass is partially visualized. Prevertebral fluid noted from C3-C4 without avid enhancement. This was not clearly seen on recent neck CT. There is disc T2 hyperintensity at C3-4  which appears continuous with the prevertebral fluid on one image. This may reflect an injury in this patient with multiple falls. No marrow edema or erosion to suggest discitis. No posterior element injury noted. Disc levels: Lower cervical degenerative changes. Upper cervical predominant facet arthropathy. Spinal canal is patent. Foraminal stenoses not well evaluated without axials. MRI THORACIC SPINE FINDINGS Alignment:  Exaggerated thoracic kyphosis. Vertebrae: No evidence of metastasis, fracture, or discitis. Cord: Normal signal and morphology. No abnormal intrathecal enhancement. Paraspinal and other soft tissues:  No acute finding Disc levels: Spondylosis. Patent spinal canal. No evidence of foraminal impingement. MRI LUMBAR SPINE FINDINGS Segmentation:  Standard. Alignment: Exaggerated lordosis. L2-3 translation and retrolisthesis. L4-5 anterolisthesis. Vertebrae:  No evidence of metastasis, acute fracture, or discitis. Conus medullaris: Extends to the L1-2 level and appears normal. Paraspinal and other soft tissues: Extensive colonic diverticulosis. Disc levels: Diffuse facet arthropathy and degenerative changes. Diffusely patent spinal canal. No high-grade foraminal impingement. IMPRESSION: 1. Negative for spinal metastasis or fracture. Diffusely patent spinal canal. 2. Small volume retropharyngeal fluid. The C3-4 disc is also T2 hyperintense. These changes could be posttraumatic in this patient with history of multiple falls; no evidence of acute fracture. No marrow edema as would be expected for discitis. Electronically Signed   By: Monte Fantasia M.D.   On: 06/11/2016 20:15   Mr Lumbar Spine W Wo Contrast (assess For Abscess, Cord Compression)  Result Date: 06/11/2016 CLINICAL DATA:  Leg weakness. History of recent tongue cancer diagnosis. EXAM: MRI TOTAL SPINE WITHOUT AND WITH CONTRAST TECHNIQUE: Multisequence MR imaging of the spine from the cervical spine to the sacrum was performed prior to and  following IV contrast administration for evaluation of spinal metastatic disease. Multiplanar and multiecho pulse sequences of the thoracic and lumbar spine were obtained without and with intravenous contrast. Study is a metastatic screening survey anal E sagittal acquisitions were obtained. CONTRAST:  50mL MULTIHANCE GADOBENATE DIMEGLUMINE 529 MG/ML IV SOLN COMPARISON:  None. FINDINGS: MRI CERVICAL SPINE FINDINGS Alignment: No traumatic malalignment. Chronic C7-T1 anterolisthesis, facet mediated. Vertebrae: No fracture, evidence of discitis, or bone lesion. Cord:  Normal signal and morphology. Paraspinal and other soft tissues: The patient's tongue base/vallecular mass is partially visualized. Prevertebral fluid noted from C3-C4 without avid enhancement. This was not clearly seen on recent neck CT. There is disc T2 hyperintensity at C3-4 which appears continuous with the prevertebral fluid on one image. This may reflect an injury in this patient with multiple falls. No marrow edema or erosion to suggest discitis. No posterior element injury noted. Disc levels: Lower cervical degenerative changes. Upper cervical predominant facet arthropathy. Spinal canal is patent. Foraminal stenoses not well evaluated without axials. MRI THORACIC SPINE FINDINGS Alignment:  Exaggerated thoracic kyphosis. Vertebrae: No evidence of metastasis, fracture, or discitis. Cord: Normal signal and morphology. No abnormal intrathecal enhancement. Paraspinal and other soft tissues: No acute finding Disc levels: Spondylosis. Patent spinal canal. No evidence of foraminal impingement. MRI LUMBAR SPINE FINDINGS Segmentation:  Standard. Alignment: Exaggerated lordosis. L2-3 translation and retrolisthesis. L4-5 anterolisthesis. Vertebrae:  No evidence of metastasis, acute fracture, or discitis. Conus medullaris: Extends to the L1-2 level and appears normal. Paraspinal and other soft tissues: Extensive colonic diverticulosis. Disc levels: Diffuse  facet arthropathy and degenerative changes. Diffusely patent spinal canal. No high-grade foraminal impingement. IMPRESSION: 1. Negative for spinal metastasis or fracture. Diffusely patent spinal canal. 2. Small volume retropharyngeal fluid. The C3-4 disc is also T2 hyperintense. These changes could be posttraumatic in this patient with history of multiple falls; no evidence of acute fracture. No marrow edema as would be expected for discitis. Electronically Signed   By: Monte Fantasia M.D.   On: 06/11/2016 20:15    EKG: Independently reviewed. Rate 98, QTc 432, normal sinus rhythm.    Assessment/Plan  1. Sepsis, unclear source:  Suspected source unclear. Organism unknown. Flu negative.  Only localizing symptoms on my exam are nuchal rigidity and RIGHT middle ear effusion with opacified mastoid on CT.  INR precludes LP for now, but will reverse  and obtain when able.  Patient meets criteria given tachycardia, tachypnea, fever, leukocytosis, and evidence of organ dysfunction.  Lactate 2.08 mmol/L and repeat ordered within 6 hours.  This patient is at high risk of poor outcomes with a qSOFA score of 2 (at least 2 of the following clinical criteria: respiratory rate of 22/min or greater, altered mentation, or systolic blood pressure of 100 mm Hg or less).  Antibiotics delivered in the ED.    -Empiric ampicillin, vancomycin, acyclovir and HD ceftriaxone -Will administer empiric dexamethasone for now -Vitamin K now -Fluoro guided LP ordered in AM  -Follow blood and urine cultures -Check LFTs and image abdomen if elevated -Acetaminophen for fever  -Sepsis bundle utilized:  -Blood and urine cultures drawn  -Antibiotics given in ER  -Repeat renal function and complete blood count in AM       2. Tongue Cancer:  This is a new diagnosis.  No mets seen on MRI spine.  3. Hypertension and coronary disease secondary prevention:  -Hold valsartan and amlodipine for now until hemodynamics  clearer  4. Atrial fibrillation:  CHADS2Vasc 3+, on warfarin for hypercoagulable state. -Hold warfarin -Continue metoprolol  5. Lupus anticoagulant with history PE:  -Hold warfarin for LP -Will need Lovenox bridge after LP  6. PMR:  -Continue prednisone 5 mg daily, if BP drops, consider stress steroids  7. Hyponatremia:  Mild. -Trend BMP  8. Normocytic anemia and thrombocytopenia: Also mild, chronic.  -Trend CBC     DVT prophylaxis: SCDs for now, Lovenox bridge after procedure  Code Status: FULL  Family Communication: Wife by phone  Disposition Plan: Anticipate LP tomorrow and continue empiric antibiotics until cell counts and gram stain and HSV PCR return, then de-escalate as able, with ID guidance.  Follow culture. Consults called: D/W ID by phone, who did not evaluate the patient Admission status: INPATIENT        Medical decision making: Patient seen at 10:09 PM on 06/11/2016.  The patient was discussed with Dr. Maryan Rued.  What exists of the patient's chart was reviewed in depth and summarized above.  Clinical condition: hemodynamically stable, but confused, tachycardic, febrile.        Edwin Dada [facility] Pager [phone]      At the time of admission, it appears that the appropriate admission status for this patient is INPATIENT. This is judged to be reasonable and necessary in order to provide the required intensity of service to ensure the patient's safety given the presenting symptoms, physical exam findings, and initial radiographic and laboratory data in the context of their chronic comorbidities.  Together, these circumstances are felt to place him at high risk for further clinical deterioration threatening life, limb, or organ.   Patient requires inpatient status due to high intensity of service, high risk for further deterioration and high frequency of surveillance required because of this acute illness that poses a threat to life,  limb or bodily function.  I certify that at the point of admission it is my clinical judgment that the patient will require inpatient hospital care spanning beyond 2 midnights from the point of admission and that early discharge would result in unnecessary risk of decompensation and readmission or threat to life, limb or bodily function.

## 2016-06-11 NOTE — ED Notes (Signed)
Patient transported to CT 

## 2016-06-11 NOTE — Progress Notes (Signed)
Pharmacy Antibiotic Note  Peter Bruce is a 66 y.o. male admitted on 06/11/2016 with r/o meningitis.  Pharmacy has been consulted for vancomycin, ampicillin and ceftriaxone dosing. Tmax is 103.1 and WBC is elevated at 21.3. SCr is 1.2 and lactic acid has improved to 1.22.   Plan: CTX 2gm IV Q12H Vanc 1500mg  IV x 1 then 1gm IV Q12H Ampicillin 2gm IV Q4H F/u renal fxn, C&S, clinical status and trough at SS  Height: 5\' 10"  (177.8 cm) Weight: 170 lb (77.1 kg) IBW/kg (Calculated) : 73  Temp (24hrs), Avg:100.7 F (38.2 C), Min:97.4 F (36.3 C), Max:103.1 F (39.5 C)   Recent Labs Lab 06/11/16 1035 06/11/16 1050 06/11/16 1454 06/11/16 1933 06/11/16 2128  WBC 21.3*  --   --   --   --   CREATININE  --   --   --  1.20  --   LATICACIDVEN  --  2.08* 1.01  --  1.22    Estimated Creatinine Clearance: 61.7 mL/min (by C-G formula based on SCr of 1.2 mg/dL).    Allergies  Allergen Reactions  . Amitriptyline Shortness Of Breath  . Serotonin Reuptake Inhibitors (Ssris) Nausea And Vomiting    SSRI uptake makes him extremely sick.    Antimicrobials this admission: Vanc 2/2>> Ampicillin 2/2>> CTX 2/2>> Zosyn x 1 2/2  Dose adjustments this admission: N/A  Microbiology results: Pending  Thank you for allowing pharmacy to be a part of this patient's care.  Terry Bolotin, Rande Lawman 06/11/2016 10:15 PM

## 2016-06-11 NOTE — ED Provider Notes (Signed)
Patient signed out at 1600 pending MRI and urine. Patient's urine without evidence of infection. MRI of the spine without signs of discitis or epidural abscess. However patient continues to get recurrent fever now 103.1 with unknown source. He is delirious, unable to stand independently and given his chronic prednisone use concern for immunosuppression. Patient started on Vanco and Zosyn. Blood cultures are pending. He was admitted for further care.   Blanchie Dessert, MD 06/11/16 2139

## 2016-06-11 NOTE — ED Notes (Signed)
No need for in and out cath pt was able to urinate

## 2016-06-11 NOTE — ED Notes (Signed)
Patient transported to X-ray 

## 2016-06-12 ENCOUNTER — Inpatient Hospital Stay (HOSPITAL_COMMUNITY): Payer: Medicare Other

## 2016-06-12 DIAGNOSIS — A408 Other streptococcal sepsis: Secondary | ICD-10-CM | POA: Diagnosis present

## 2016-06-12 LAB — BLOOD CULTURE ID PANEL (REFLEXED)
ACINETOBACTER BAUMANNII: NOT DETECTED
CANDIDA ALBICANS: NOT DETECTED
CANDIDA KRUSEI: NOT DETECTED
CANDIDA PARAPSILOSIS: NOT DETECTED
Candida glabrata: NOT DETECTED
Candida tropicalis: NOT DETECTED
ENTEROBACTERIACEAE SPECIES: NOT DETECTED
ESCHERICHIA COLI: NOT DETECTED
Enterobacter cloacae complex: NOT DETECTED
Enterococcus species: NOT DETECTED
HAEMOPHILUS INFLUENZAE: NOT DETECTED
KLEBSIELLA OXYTOCA: NOT DETECTED
Klebsiella pneumoniae: NOT DETECTED
Listeria monocytogenes: NOT DETECTED
Neisseria meningitidis: NOT DETECTED
Proteus species: NOT DETECTED
Pseudomonas aeruginosa: NOT DETECTED
STREPTOCOCCUS SPECIES: DETECTED — AB
Serratia marcescens: NOT DETECTED
Staphylococcus aureus (BCID): NOT DETECTED
Staphylococcus species: NOT DETECTED
Streptococcus agalactiae: NOT DETECTED
Streptococcus pneumoniae: NOT DETECTED
Streptococcus pyogenes: NOT DETECTED

## 2016-06-12 LAB — HEPATIC FUNCTION PANEL
ALBUMIN: 2.5 g/dL — AB (ref 3.5–5.0)
ALT: 23 U/L (ref 17–63)
AST: 39 U/L (ref 15–41)
Alkaline Phosphatase: 47 U/L (ref 38–126)
BILIRUBIN DIRECT: 0.2 mg/dL (ref 0.1–0.5)
Indirect Bilirubin: 0.5 mg/dL (ref 0.3–0.9)
Total Bilirubin: 0.7 mg/dL (ref 0.3–1.2)
Total Protein: 6.2 g/dL — ABNORMAL LOW (ref 6.5–8.1)

## 2016-06-12 LAB — COMPREHENSIVE METABOLIC PANEL
ALK PHOS: 48 U/L (ref 38–126)
ALT: 23 U/L (ref 17–63)
AST: 38 U/L (ref 15–41)
Albumin: 2.5 g/dL — ABNORMAL LOW (ref 3.5–5.0)
Anion gap: 5 (ref 5–15)
BILIRUBIN TOTAL: 0.6 mg/dL (ref 0.3–1.2)
BUN: 19 mg/dL (ref 6–20)
CO2: 28 mmol/L (ref 22–32)
CREATININE: 1.14 mg/dL (ref 0.61–1.24)
Calcium: 8.4 mg/dL — ABNORMAL LOW (ref 8.9–10.3)
Chloride: 104 mmol/L (ref 101–111)
GFR calc Af Amer: >60 mL/min (ref >60)
Glucose, Bld: 183 mg/dL — ABNORMAL HIGH (ref 65–99)
Potassium: 4.3 mmol/L (ref 3.5–5.1)
Sodium: 137 mmol/L (ref 135–145)
TOTAL PROTEIN: 6.2 g/dL — AB (ref 6.5–8.1)

## 2016-06-12 LAB — CBC WITH DIFFERENTIAL/PLATELET
BASOS ABS: 0 10*3/uL (ref 0.0–0.1)
BASOS PCT: 0 %
Eosinophils Absolute: 0 10*3/uL (ref 0.0–0.7)
Eosinophils Relative: 0 %
HEMATOCRIT: 30.3 % — AB (ref 39.0–52.0)
HEMOGLOBIN: 10 g/dL — AB (ref 13.0–17.0)
LYMPHS PCT: 2 %
Lymphs Abs: 0.4 10*3/uL — ABNORMAL LOW (ref 0.7–4.0)
MCH: 30.6 pg (ref 26.0–34.0)
MCHC: 33 g/dL (ref 30.0–36.0)
MCV: 92.7 fL (ref 78.0–100.0)
MONO ABS: 0.3 10*3/uL (ref 0.1–1.0)
Monocytes Relative: 2 %
NEUTROS ABS: 15.5 10*3/uL — AB (ref 1.7–7.7)
NEUTROS PCT: 96 %
Platelets: 91 10*3/uL — ABNORMAL LOW (ref 150–400)
RBC: 3.27 MIL/uL — AB (ref 4.22–5.81)
RDW: 13.6 % (ref 11.5–15.5)
WBC: 16.2 10*3/uL — ABNORMAL HIGH (ref 4.0–10.5)

## 2016-06-12 LAB — URINE CULTURE

## 2016-06-12 LAB — PROTIME-INR
INR: 2.04
PROTHROMBIN TIME: 23.3 s — AB (ref 11.4–15.2)

## 2016-06-12 LAB — VITAMIN B12: Vitamin B-12: 831 pg/mL (ref 180–914)

## 2016-06-12 LAB — TSH: TSH: 1.071 u[IU]/mL (ref 0.350–4.500)

## 2016-06-12 LAB — MRSA PCR SCREENING: MRSA by PCR: NEGATIVE

## 2016-06-12 LAB — AMMONIA

## 2016-06-12 MED ORDER — DEXAMETHASONE SODIUM PHOSPHATE 10 MG/ML IJ SOLN
10.0000 mg | Freq: Four times a day (QID) | INTRAMUSCULAR | Status: DC
  Administered 2016-06-12 – 2016-06-14 (×8): 10 mg via INTRAVENOUS
  Filled 2016-06-12 (×11): qty 1

## 2016-06-12 MED ORDER — ACETAMINOPHEN 650 MG RE SUPP: 650.0000 mg | Freq: Four times a day (QID) | RECTAL | Status: DC | PRN

## 2016-06-12 MED ORDER — DEXTROSE 5 % IV SOLN
10.0000 mg/kg | Freq: Three times a day (TID) | INTRAVENOUS | Status: DC
  Administered 2016-06-12 – 2016-06-14 (×8): 770 mg via INTRAVENOUS
  Filled 2016-06-12 (×10): qty 15.4

## 2016-06-12 MED ORDER — ACETAMINOPHEN 325 MG PO TABS
650.0000 mg | ORAL_TABLET | Freq: Four times a day (QID) | ORAL | Status: DC | PRN
  Administered 2016-06-17: 650 mg via ORAL
  Filled 2016-06-12 (×2): qty 2

## 2016-06-12 MED ORDER — ONDANSETRON HCL 4 MG PO TABS: 4.0000 mg | ORAL_TABLET | Freq: Four times a day (QID) | ORAL | Status: DC | PRN

## 2016-06-12 MED ORDER — ONDANSETRON HCL 4 MG/2ML IJ SOLN: 4.0000 mg | Freq: Four times a day (QID) | INTRAMUSCULAR | Status: DC | PRN

## 2016-06-12 MED ORDER — OXYCODONE HCL 5 MG PO TABS
5.0000 mg | ORAL_TABLET | ORAL | Status: DC | PRN
  Administered 2016-06-13: 5 mg via ORAL
  Filled 2016-06-12: qty 1

## 2016-06-12 MED ORDER — METOPROLOL SUCCINATE ER 25 MG PO TB24: 12.5000 mg | ORAL_TABLET | Freq: Every day | ORAL | Status: DC

## 2016-06-12 MED ORDER — SODIUM CHLORIDE 0.9 % IV SOLN
INTRAVENOUS | Status: DC
  Administered 2016-06-12 (×2): via INTRAVENOUS
  Administered 2016-06-13: 1000 mL via INTRAVENOUS
  Administered 2016-06-13: 18:00:00 via INTRAVENOUS
  Administered 2016-06-14: 125 mL via INTRAVENOUS
  Administered 2016-06-14 – 2016-06-15 (×2): via INTRAVENOUS

## 2016-06-12 MED ORDER — HALOPERIDOL LACTATE 5 MG/ML IJ SOLN
2.0000 mg | Freq: Four times a day (QID) | INTRAMUSCULAR | Status: DC | PRN
  Administered 2016-06-12 – 2016-06-17 (×8): 2 mg via INTRAVENOUS
  Filled 2016-06-12 (×8): qty 1

## 2016-06-12 MED ORDER — ACETAMINOPHEN 650 MG RE SUPP: 650.0000 mg | RECTAL | Status: DC | PRN

## 2016-06-12 MED ORDER — SODIUM CHLORIDE 0.9% FLUSH
3.0000 mL | Freq: Two times a day (BID) | INTRAVENOUS | Status: DC
  Administered 2016-06-12 – 2016-06-17 (×10): 3 mL via INTRAVENOUS

## 2016-06-12 MED ORDER — PHYTONADIONE 5 MG PO TABS
5.0000 mg | ORAL_TABLET | Freq: Once | ORAL | Status: DC
  Filled 2016-06-12: qty 1

## 2016-06-12 MED ORDER — VITAMIN K1 10 MG/ML IJ SOLN
5.0000 mg | Freq: Once | INTRAVENOUS | Status: AC
  Administered 2016-06-12: 5 mg via INTRAVENOUS
  Filled 2016-06-12: qty 0.5

## 2016-06-12 MED ORDER — QUETIAPINE FUMARATE 25 MG PO TABS
25.0000 mg | ORAL_TABLET | Freq: Every day | ORAL | Status: DC
  Administered 2016-06-12 – 2016-06-15 (×4): 25 mg via ORAL
  Filled 2016-06-12 (×4): qty 1

## 2016-06-12 NOTE — ED Notes (Signed)
Pt appears to be desat'ing on the monitor with SpO2 levels in 60s with a good pleth. Placed on 2L O2 via [place].

## 2016-06-12 NOTE — ED Notes (Signed)
NPO after 4 am..Marland KitchenMarland Kitchen

## 2016-06-12 NOTE — Progress Notes (Signed)
Pharmacy Antibiotic Note  Peter Bruce is a 66 y.o. male admitted on 06/11/2016 with r/o meningitis.  Pharmacy has been consulted for Acyclovir dosing (already on vanco/ceftriaxone/ampicillin). WBC elevated. Renal function ok.   Plan: -Acyclovir 10 mg/kg IV q8h -F/U infectious work-up  Height: 5\' 10"  (177.8 cm) Weight: 170 lb (77.1 kg) IBW/kg (Calculated) : 73  Temp (24hrs), Avg:99.9 F (37.7 C), Min:97.4 F (36.3 C), Max:103.1 F (39.5 C)   Recent Labs Lab 06/11/16 1035 06/11/16 1050 06/11/16 1454 06/11/16 1933 06/11/16 2128  WBC 21.3*  --   --   --   --   CREATININE  --   --   --  1.20  --   LATICACIDVEN  --  2.08* 1.01  --  1.22    Estimated Creatinine Clearance: 61.7 mL/min (by C-G formula based on SCr of 1.2 mg/dL).    Allergies  Allergen Reactions  . Amitriptyline Shortness Of Breath  . Serotonin Reuptake Inhibitors (Ssris) Nausea And Vomiting    SSRI uptake makes him extremely sick.     Narda Bonds 06/12/2016 12:24 AM

## 2016-06-12 NOTE — Progress Notes (Signed)
Pt admitted to 4E17 on room air with VSS: temp 96.2 rectally, b/p 107/71 HR 82 resp 24. CHG bath complete with skin assessment. Pt has abrasions to head and bruising on arms. Also, an old surgical scar in the middle of chest. Pts belonging included clothes which are at the bedside. Pt A&O x 2-3. Pt very agitated/ restless wanting to speak to wife. However, no family members were at the bedside during transfer.

## 2016-06-12 NOTE — Progress Notes (Signed)
[facility] Progress Note  Patient: Peter Bruce [id number]   PCP: Adrian Prows, MD DOB: March 22, 1951   DOA: 06/11/2016   DOS: 06/12/2016   Date of Service: the patient was seen and examined on 06/12/2016  Subjective: Patient remains confused, feels that there is nothing wrong with him and wants to go home. On further explaining that he has a life-threatening infection he understands and agrees to remain in the hospital. Patient's wife's biggest concern is him leaving the hospital AMA.  Brief hospital course: Pt. 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 Currently further plan is continue IV antibiotics and further workup.  Assessment and Plan: 1. Other streptococcal sepsis ([place]) Bacteremia with Streptococcus species. Patient's blood culture is positive for streptococcal species. MRSA PCR is negative. Based on this information currently I am not having antibiotics to ceftriaxone. Continuing acyclovir at present. INR 2.0. We'll give vitamin K. Recheck INR this afternoon. Attempt LP tomorrow if the INR is less than 1.2. Interventional radiology consulted. Urine culture shows multiple species. Blood culture will be repeated tomorrow. Chest x-ray does not show any pneumonia.  2. History of pulmonary embolism   Paroxysmal atrial fibrillation (HCC) On Coumadin at home. Currently holding Coumadin in the setting of requirement for LP. We'll monitor daily INR. Resume Coumadin once at these performed.  3.  Polymyalgia rheumatica--rheumatol [place]-2016 On chronic prednisone at home, currently on IV Decadron. In the hospital.  4.  Chronic diastolic CHF (congestive heart failure) (HCC) EF 50-55%. I'm repeating an echocardiogram. Holding home Toprol-XL as well as amlodipine at present. While receiving IV hydration for  sepsis monitor for volume overload.  5.  Essential hypertension Blood pressure currently soft. Currently holding home antihypertensive medications.  Bowel regimen: last BM 06/11/2016 Diet: CARDIAC DIET DVT Prophylaxis: warfarin  Advance goals of care discussion: full code  Family Communication: family was present at bedside, at the time of interview. The pt provided permission to discuss medical plan with the family. Opportunity was given to ask question and all questions were answered satisfactorily.   Disposition:  Discharge to SNF. Expected discharge date: 06/16/2016,  Consultants: IR Procedures: LP  Antibiotics: Anti-infectives    Start     Dose/Rate Route Frequency Ordered Stop   06/12/16 1100  vancomycin (VANCOCIN) IVPB 1000 mg/200 mL premix  Status:  Discontinued     1,000 mg 200 mL/hr over 60 Minutes Intravenous Every 12 hours 06/11/16 2214 06/12/16 0942   06/12/16 0300  ampicillin (OMNIPEN) 2 g in sodium chloride 0.9 % 50 mL IVPB  Status:  Discontinued     2 g 150 mL/hr over 20 Minutes Intravenous Every 4 hours 06/11/16 2214 06/12/16 0942   06/12/16 0045  acyclovir (ZOVIRAX) 770 mg in dextrose 5 % 150 mL IVPB     10 mg/kg  77.1 kg 165.4 mL/hr over 60 Minutes Intravenous Every 8 hours 06/12/16 0023     06/11/16 2215  cefTRIAXone (ROCEPHIN) 2 g in dextrose 5 % 50 mL IVPB     2 g 100 mL/hr over 30 Minutes Intravenous Every 12 hours 06/11/16 2204     06/11/16 2215  cefTRIAXone (ROCEPHIN) 2 g in dextrose 5 % 50 mL IVPB  Status:  Discontinued     2 g 100 mL/hr over 30 Minutes Intravenous  Once 06/11/16 2206 06/11/16 2207   06/11/16 2215  vancomycin (VANCOCIN) IVPB 1000 mg/200 mL premix  Status:  Discontinued     1,000 mg 200 mL/hr over 60 Minutes Intravenous  Once 06/11/16 2206 06/11/16 2207   06/11/16 2215  ampicillin (OMNIPEN) 2 g in sodium chloride 0.9 % 50 mL IVPB     2 g 150 mL/hr over 20 Minutes Intravenous  Once 06/11/16 2206 06/11/16 2325   06/11/16 2215   vancomycin (VANCOCIN) 1,500 mg in sodium chloride 0.9 % 500 mL IVPB     1,500 mg 250 mL/hr over 120 Minutes Intravenous  Once 06/11/16 2209 06/12/16 0121   06/11/16 2115  vancomycin (VANCOCIN) IVPB 1000 mg/200 mL premix  Status:  Discontinued     1,000 mg 200 mL/hr over 60 Minutes Intravenous  Once 06/11/16 2110 06/11/16 2209   06/11/16 2115  piperacillin-tazobactam (ZOSYN) IVPB 3.375 g  Status:  Discontinued     3.375 g 12.5 mL/hr over 240 Minutes Intravenous  Once 06/11/16 2110 06/11/16 2209        Objective: Physical Exam: Vitals:   06/12/16 0829 06/12/16 1231 06/12/16 1500 06/12/16 1637  BP: 110/71 95/66 115/72 123/68  Pulse: 73 73 87 93  Resp: 13 16 11    Temp:  97.2 F (36.2 C) 97.9 F (36.6 C) 97.7 F (36.5 C)  TempSrc:  Oral Axillary Oral  SpO2: 99%  92%   Weight:      Height:        Intake/Output Summary (Last 24 hours) at 06/12/16 1659 Last data filed at 06/12/16 0700  Gross per 24 hour  Intake              800 ml  Output              200 ml  Net              600 ml   Filed Weights   06/11/16 1026 06/12/16 0639  Weight: 77.1 kg (170 lb) 79.7 kg (175 lb 11.3 oz)    General: Alert, Awake and Oriented to Time, Place and Person. Appear in moderate distress, affect appropriate Eyes: PERRL, Conjunctiva normal ENT: Oral Mucosa clear moist. Neck: no JVD, no Abnormal Mass Or lumps Cardiovascular: S1 and S2 Present, aortic systolic Murmur, Respiratory: Bilateral Air entry equal and Decreased, no use of accessory muscle, Clear to Auscultation, no Crackles, no wheezes Abdomen: Bowel Sound present, Soft and no tenderness Skin: no redness, no Rash, no induration Extremities: no Pedal edema, no calf tenderness Neurologic: Grossly no focal neuro deficit. Bilaterally Equal motor strength  Data Reviewed: CBC:  Recent Labs Lab 06/11/16 1035 06/11/16 1933 06/12/16 1118  WBC 21.3*  --  16.2*  NEUTROABS 19.2*  --  15.5*  HGB 11.3* 10.9* 10.0*  HCT 34.1* 32.0* 30.3*    MCV 91.9  --  92.7  PLT 127*  --  91*   Basic Metabolic Panel:  Recent Labs Lab 06/11/16 1933 06/12/16 1118  NA 132* 137  K 4.4 4.3  CL 98* 104  CO2  --  28  GLUCOSE 100* 183*  BUN 21* 19  CREATININE 1.20 1.14  CALCIUM  --  8.4*    Liver Function Tests:  Recent Labs Lab 06/12/16 1118  AST 39  38  ALT 23  23  ALKPHOS 47  48  BILITOT 0.7  0.6  PROT 6.2*  6.2*  ALBUMIN 2.5*  2.5*   No results for input(s): LIPASE, AMYLASE in the last 168 hours.  Recent Labs Lab 06/12/16 1118  AMMONIA <  9*   Coagulation Profile:  Recent Labs Lab 06/11/16 1219 06/12/16 1118  INR 1.98 2.04   Cardiac Enzymes: No results for input(s): CKTOTAL, CKMB, CKMBINDEX, TROPONINI in the last 168 hours. BNP (last 3 results) No results for input(s): PROBNP in the last 8760 hours.  CBG:  Recent Labs Lab 06/11/16 1011  GLUCAP 86    Studies: Mr Brain Wo Contrast  Result Date: 06/12/2016 CLINICAL DATA:  Acute encephalopathy. History of lupus and pulmonary embolism. Recent diagnosis of tongue cancer. EXAM: MRI HEAD WITHOUT CONTRAST TECHNIQUE: Multiplanar, multiecho pulse sequences of the brain and surrounding structures were obtained without intravenous contrast. COMPARISON:  Head CT from yesterday FINDINGS: Brain: Scattered recent infarcts, including a moderate area of infarct in the lateral and anterior left temporal lobe. Smaller areas of infarction in the inferior left occipital cortex, right cerebellum, right posterior cerebral white matter, right caudate head, and bilateral parasagittal parietal cortex. Some of the smaller infarcts have weaker diffusion restriction and may be subacute. The left temporal and occipital infarcts have mild petechial hemorrhage based on gradient imaging. There is also superficial T1 hyperintensity at these infarcts. Background chronic ischemic injury is overall mild. There has been a remote lacunar infarct in the right centrum semiovale. No hydrocephalus.  History of tongue cancer. No overt mass. Intracranial screening would require contrast. Vascular: Preserved major flow voids. Skull and upper cervical spine: No marrow lesion noted. Sinuses/Orbits: Mastoid fluid on the right with negative nasopharynx. 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 Moderate area of infarction in the lateral left temporal lobe. Mild petechial hemorrhage. Electronically Signed   By: Monte Fantasia M.D.   On: 06/12/2016 16:41   Mr Cervical Spine W Or Wo Contrast  Result Date: 06/11/2016 CLINICAL DATA:  Leg weakness. History of recent tongue cancer diagnosis. EXAM: MRI TOTAL SPINE WITHOUT AND WITH CONTRAST TECHNIQUE: Multisequence MR imaging of the spine from the cervical spine to the sacrum was performed prior to and following IV contrast administration for evaluation of spinal metastatic disease. Multiplanar and multiecho pulse sequences of the thoracic and lumbar spine were obtained without and with intravenous contrast. Study is a metastatic screening survey anal E sagittal acquisitions were obtained. CONTRAST:  66mL MULTIHANCE GADOBENATE DIMEGLUMINE 529 MG/ML IV SOLN COMPARISON:  None. FINDINGS: MRI CERVICAL SPINE FINDINGS Alignment: No traumatic malalignment. Chronic C7-T1 anterolisthesis, facet mediated. Vertebrae: No fracture, evidence of discitis, or bone lesion. Cord:  Normal signal and morphology. Paraspinal and other soft tissues: The patient's tongue base/vallecular mass is partially visualized. Prevertebral fluid noted from C3-C4 without avid enhancement. This was not clearly seen on recent neck CT. There is disc T2 hyperintensity at C3-4 which appears continuous with the prevertebral fluid on one image. This may reflect an injury in this patient with multiple falls. No marrow edema or erosion to suggest discitis. No posterior element injury noted. Disc levels: Lower cervical degenerative changes. Upper  cervical predominant facet arthropathy. Spinal canal is patent. Foraminal stenoses not well evaluated without axials. MRI THORACIC SPINE FINDINGS Alignment:  Exaggerated thoracic kyphosis. Vertebrae: No evidence of metastasis, fracture, or discitis. Cord: Normal signal and morphology. No abnormal intrathecal enhancement. Paraspinal and other soft tissues: No acute finding Disc levels: Spondylosis. Patent spinal canal. No evidence of foraminal impingement. MRI LUMBAR SPINE FINDINGS Segmentation:  Standard. Alignment: Exaggerated lordosis. L2-3 translation and retrolisthesis. L4-5 anterolisthesis. Vertebrae:  No evidence of metastasis, acute fracture, or discitis. Conus medullaris: Extends to the L1-2 level and appears normal.  Paraspinal and other soft tissues: Extensive colonic diverticulosis. Disc levels: Diffuse facet arthropathy and degenerative changes. Diffusely patent spinal canal. No high-grade foraminal impingement. IMPRESSION: 1. Negative for spinal metastasis or fracture. Diffusely patent spinal canal. 2. Small volume retropharyngeal fluid. The C3-4 disc is also T2 hyperintense. These changes could be posttraumatic in this patient with history of multiple falls; no evidence of acute fracture. No marrow edema as would be expected for discitis. Electronically Signed   By: Monte Fantasia M.D.   On: 06/11/2016 20:15   Mr Thoracic Spine W Wo Contrast  Result Date: 06/11/2016 CLINICAL DATA:  Leg weakness. History of recent tongue cancer diagnosis. EXAM: MRI TOTAL SPINE WITHOUT AND WITH CONTRAST TECHNIQUE: Multisequence MR imaging of the spine from the cervical spine to the sacrum was performed prior to and following IV contrast administration for evaluation of spinal metastatic disease. Multiplanar and multiecho pulse sequences of the thoracic and lumbar spine were obtained without and with intravenous contrast. Study is a metastatic screening survey anal E sagittal acquisitions were obtained. CONTRAST:  55mL  MULTIHANCE GADOBENATE DIMEGLUMINE 529 MG/ML IV SOLN COMPARISON:  None. FINDINGS: MRI CERVICAL SPINE FINDINGS Alignment: No traumatic malalignment. Chronic C7-T1 anterolisthesis, facet mediated. Vertebrae: No fracture, evidence of discitis, or bone lesion. Cord:  Normal signal and morphology. Paraspinal and other soft tissues: The patient's tongue base/vallecular mass is partially visualized. Prevertebral fluid noted from C3-C4 without avid enhancement. This was not clearly seen on recent neck CT. There is disc T2 hyperintensity at C3-4 which appears continuous with the prevertebral fluid on one image. This may reflect an injury in this patient with multiple falls. No marrow edema or erosion to suggest discitis. No posterior element injury noted. Disc levels: Lower cervical degenerative changes. Upper cervical predominant facet arthropathy. Spinal canal is patent. Foraminal stenoses not well evaluated without axials. MRI THORACIC SPINE FINDINGS Alignment:  Exaggerated thoracic kyphosis. Vertebrae: No evidence of metastasis, fracture, or discitis. Cord: Normal signal and morphology. No abnormal intrathecal enhancement. Paraspinal and other soft tissues: No acute finding Disc levels: Spondylosis. Patent spinal canal. No evidence of foraminal impingement. MRI LUMBAR SPINE FINDINGS Segmentation:  Standard. Alignment: Exaggerated lordosis. L2-3 translation and retrolisthesis. L4-5 anterolisthesis. Vertebrae:  No evidence of metastasis, acute fracture, or discitis. Conus medullaris: Extends to the L1-2 level and appears normal. Paraspinal and other soft tissues: Extensive colonic diverticulosis. Disc levels: Diffuse facet arthropathy and degenerative changes. Diffusely patent spinal canal. No high-grade foraminal impingement. IMPRESSION: 1. Negative for spinal metastasis or fracture. Diffusely patent spinal canal. 2. Small volume retropharyngeal fluid. The C3-4 disc is also T2 hyperintense. These changes could be  posttraumatic in this patient with history of multiple falls; no evidence of acute fracture. No marrow edema as would be expected for discitis. Electronically Signed   By: Monte Fantasia M.D.   On: 06/11/2016 20:15   Mr Lumbar Spine W Wo Contrast (assess For Abscess, Cord Compression)  Result Date: 06/11/2016 CLINICAL DATA:  Leg weakness. History of recent tongue cancer diagnosis. EXAM: MRI TOTAL SPINE WITHOUT AND WITH CONTRAST TECHNIQUE: Multisequence MR imaging of the spine from the cervical spine to the sacrum was performed prior to and following IV contrast administration for evaluation of spinal metastatic disease. Multiplanar and multiecho pulse sequences of the thoracic and lumbar spine were obtained without and with intravenous contrast. Study is a metastatic screening survey anal E sagittal acquisitions were obtained. CONTRAST:  26mL MULTIHANCE GADOBENATE DIMEGLUMINE 529 MG/ML IV SOLN COMPARISON:  None. FINDINGS: MRI CERVICAL SPINE FINDINGS Alignment:  No traumatic malalignment. Chronic C7-T1 anterolisthesis, facet mediated. Vertebrae: No fracture, evidence of discitis, or bone lesion. Cord:  Normal signal and morphology. Paraspinal and other soft tissues: The patient's tongue base/vallecular mass is partially visualized. Prevertebral fluid noted from C3-C4 without avid enhancement. This was not clearly seen on recent neck CT. There is disc T2 hyperintensity at C3-4 which appears continuous with the prevertebral fluid on one image. This may reflect an injury in this patient with multiple falls. No marrow edema or erosion to suggest discitis. No posterior element injury noted. Disc levels: Lower cervical degenerative changes. Upper cervical predominant facet arthropathy. Spinal canal is patent. Foraminal stenoses not well evaluated without axials. MRI THORACIC SPINE FINDINGS Alignment:  Exaggerated thoracic kyphosis. Vertebrae: No evidence of metastasis, fracture, or discitis. Cord: Normal signal and  morphology. No abnormal intrathecal enhancement. Paraspinal and other soft tissues: No acute finding Disc levels: Spondylosis. Patent spinal canal. No evidence of foraminal impingement. MRI LUMBAR SPINE FINDINGS Segmentation:  Standard. Alignment: Exaggerated lordosis. L2-3 translation and retrolisthesis. L4-5 anterolisthesis. Vertebrae:  No evidence of metastasis, acute fracture, or discitis. Conus medullaris: Extends to the L1-2 level and appears normal. Paraspinal and other soft tissues: Extensive colonic diverticulosis. Disc levels: Diffuse facet arthropathy and degenerative changes. Diffusely patent spinal canal. No high-grade foraminal impingement. IMPRESSION: 1. Negative for spinal metastasis or fracture. Diffusely patent spinal canal. 2. Small volume retropharyngeal fluid. The C3-4 disc is also T2 hyperintense. These changes could be posttraumatic in this patient with history of multiple falls; no evidence of acute fracture. No marrow edema as would be expected for discitis. Electronically Signed   By: Monte Fantasia M.D.   On: 06/11/2016 20:15     Scheduled Meds: . acyclovir  10 mg/kg Intravenous Q8H  . cefTRIAXone (ROCEPHIN)  IV  2 g Intravenous Q12H  . dexamethasone  10 mg Intravenous Q6H  . sodium chloride flush  3 mL Intravenous Q12H   Continuous Infusions: . sodium chloride 125 mL/hr at 06/12/16 1130   PRN Meds: acetaminophen **OR** acetaminophen, haloperidol lactate, ondansetron **OR** ondansetron (ZOFRAN) IV, ondansetron (ZOFRAN) IV, oxyCODONE  Time spent: 30 minutes  Author: Berle Mull, MD [facility] Pager: [phone] 06/12/2016 4:59 PM  If 7PM-7AM, please contact night-coverage at [email or web address], password [email or web address]

## 2016-06-12 NOTE — Progress Notes (Signed)
PHARMACY - PHYSICIAN COMMUNICATION CRITICAL VALUE ALERT - BLOOD CULTURE IDENTIFICATION (BCID)  Results for orders placed or performed during the hospital encounter of 06/11/16  Blood Culture ID Panel (Reflexed) (Collected: 06/11/2016 10:40 AM)  Result Value Ref Range   Enterococcus species NOT DETECTED NOT DETECTED   Listeria monocytogenes NOT DETECTED NOT DETECTED   Staphylococcus species NOT DETECTED NOT DETECTED   Staphylococcus aureus NOT DETECTED NOT DETECTED   Streptococcus species DETECTED (A) NOT DETECTED   Streptococcus agalactiae NOT DETECTED NOT DETECTED   Streptococcus pneumoniae NOT DETECTED NOT DETECTED   Streptococcus pyogenes NOT DETECTED NOT DETECTED   Acinetobacter baumannii NOT DETECTED NOT DETECTED   Enterobacteriaceae species NOT DETECTED NOT DETECTED   Enterobacter cloacae complex NOT DETECTED NOT DETECTED   Escherichia coli NOT DETECTED NOT DETECTED   Klebsiella oxytoca NOT DETECTED NOT DETECTED   Klebsiella pneumoniae NOT DETECTED NOT DETECTED   Proteus species NOT DETECTED NOT DETECTED   Serratia marcescens NOT DETECTED NOT DETECTED   Haemophilus influenzae NOT DETECTED NOT DETECTED   Neisseria meningitidis NOT DETECTED NOT DETECTED   Pseudomonas aeruginosa NOT DETECTED NOT DETECTED   Candida albicans NOT DETECTED NOT DETECTED   Candida glabrata NOT DETECTED NOT DETECTED   Candida krusei NOT DETECTED NOT DETECTED   Candida parapsilosis NOT DETECTED NOT DETECTED   Candida tropicalis NOT DETECTED NOT DETECTED    Name of physician (or Provider) Contacted: Dr. Posey Pronto  Changes to prescribed antibiotics required:  Blood cultures showing Strep species in 4/4 bottles.  Vancomycin and Ampicillin were discontinued and the patient was continued on Rocephin 2g IV every 12 hours. HSV antigen is still pending - so Acyclovir to continue for now.   Thank you for allowing pharmacy to be a part of this patient's care.  Alycia Rossetti, PharmD, BCPS Clinical  Pharmacist Pager: [phone] 06/12/2016 9:40 AM

## 2016-06-12 NOTE — Evaluation (Signed)
Clinical/Bedside Swallow Evaluation Patient Details  Name: Peter Bruce MRN: [id number] Date of Birth: Nov 26, 1950  Today's Date: 06/12/2016 Time: SLP Start Time (ACUTE ONLY): 19 SLP Stop Time (ACUTE ONLY): 1200 SLP Time Calculation (min) (ACUTE ONLY): 30 min  Past Medical History:  Past Medical History:  Diagnosis Date  . Acute pulmonary embolus ([place]) 2010  . Arthritis   . Atrial fibrillation [facility])    s/p inpatient DCCV 09/11/2015  . Atrial flutter ([place])   . CHF (congestive heart failure) ([place])   . Coronary artery disease involving native coronary artery 05/15/2015   multivessel  . Dysrhythmia    A-fib  . Essential hypertension   . Fibromyalgia   . History of kidney stones   . Hypercholesterolemia   . Hypertension   . Lupus anticoagulant positive 07/29/2008  . Mitral regurgitation 10/30/2015  . PMR (polymyalgia rheumatica) ([facility])   . Pneumonia   . 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 S/P Maze operation for atrial fibrillation 05/29/2015   Complete bilateral atrial lesion set via median sternotomy using bipolar radiofrequency and cryothermy ablation with clipping of LA appendage  . 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 APPENDECTOMY    . arm surgery  Right    wrist and elbow  . CARDIAC CATHETERIZATION N/A 05/15/2015   Procedure: Left Heart Cath and Coronary Angiography;  Surgeon: Peter M Martinique, MD;  Location: [place] CV LAB;  Service: Cardiovascular;  Laterality: N/A;  . CARDIAC CATHETERIZATION N/A 11/17/2015   Procedure: Right/Left Heart Cath and Coronary/Graft Angiography;  Surgeon: Adrian Prows, MD;  Location: [place] CV LAB;  Service: Cardiovascular;  Laterality: N/A;  . CARDIOVERSION N/A 09/11/2015   Procedure: CARDIOVERSION;  Surgeon: Lelon Perla, MD;  Location: [facility] ENDOSCOPY;  Service: Cardiovascular;  Laterality: N/A;  . CHEST TUBE INSERTION  11/21/2015   Procedure: LEFT CHEST TUBE INSERTION;  Surgeon: Rexene Alberts, MD;  Location: [place];  Service: Open Heart Surgery;;  . CLIPPING OF ATRIAL APPENDAGE  05/29/2015   Procedure: CLIPPING OF LEFT ATRIAL APPENDAGE;  Surgeon: Rexene Alberts, MD;  Location: [place];  Service: Open Heart Surgery;;  45 AtriClip PRO 145  . CORONARY ARTERY BYPASS GRAFT N/A 05/29/2015   Procedure: CORONARY ARTERY BYPASS GRAFTING (CABG), ON PUMP, TIMES THREE, USING LEFT INTERNAL MAMMARY ARTERY, BILATERAL GREATER SAPHENOUS VEINS HARVESTED ENDOSCOPICALLY;  Surgeon: Rexene Alberts, MD;  Location: [place];  Service: Open Heart Surgery;  Laterality: N/A;  . FRACTURE SURGERY     right wrist  . HERNIA REPAIR    . MAZE N/A 05/29/2015   Procedure: MAZE;  Surgeon: Rexene Alberts, MD;  Location: [place];  Service: Open Heart Surgery;  Laterality: N/A;  Complete Bi-Atrial Lesion set with Ablation and Cryothermy  . MITRAL VALVE REPAIR Right 11/21/2015   Procedure: MINIMALLY INVASIVE REOPERATION FOR MITRAL VALVE REPAIR;  Surgeon: Rexene Alberts, MD;  Location: [place];  Service: Open Heart Surgery;  Laterality: Right;  . PERIPHERAL VASCULAR CATHETERIZATION Bilateral 11/17/2015   Procedure: Renal Angiography;  Surgeon: Adrian Prows, MD;  Location: [place] CV LAB;  Service: Cardiovascular;  Laterality: Bilateral;  . PERIPHERAL VASCULAR CATHETERIZATION N/A 11/17/2015   Procedure: Abdominal Aortogram;  Surgeon: Adrian Prows, MD;  Location: [place] CV LAB;  Service: Cardiovascular;  Laterality: N/A;  . SHOULDER SURGERY Left    clavicular fracture  . TEE WITHOUT CARDIOVERSION N/A 05/29/2015   Procedure: TRANSESOPHAGEAL ECHOCARDIOGRAM (TEE);  Surgeon: Rexene Alberts, MD;  Location: [place];  Service:  Open Heart Surgery;  Laterality: N/A;  . TEE WITHOUT CARDIOVERSION N/A 10/30/2015   Procedure: TRANSESOPHAGEAL ECHOCARDIOGRAM (TEE);  Surgeon: Adrian Prows, MD;  Location: [place];  Service: Cardiovascular;  Laterality: N/A;  . TEE WITHOUT CARDIOVERSION N/A 11/21/2015   Procedure: TRANSESOPHAGEAL ECHOCARDIOGRAM (TEE);  Surgeon: Rexene Alberts, MD;  Location: [place];  Service: Open Heart Surgery;  Laterality: N/A;   HPI:  Peter Bruce is a 66 y.o. male with a PMH: r lupus AC and hx of PE on warfarin, PMR on pred 5, Atrial fibrillation, CAD, HTN, and recent diagnosis of tongue CA. He presented to hospital with fever and intermittent confusion and leg weakness.   Assessment / Plan / Recommendation Clinical Impression  Patient presents with a mild oropharyngeal dysphagia characterized by delayed cough with thin liquids and mild delay in oral transit of solid texture bolus. SLP does suspect that dysphagia is currently impacted by his level of alertness and confusion, as he is currently lethargic and had recently been given medication to calm him down as he was agitated earlier. SLP spoke with patient's wife and informed her that currently, there does not appear to be any impact from his tongue cancer on his ability to swallow, however this is something to monitor.    Aspiration Risk  Mild aspiration risk    Diet Recommendation Regular;Thin liquid   Liquid Administration via: Cup;No straw Medication Administration: Whole meds with liquid Supervision: Full supervision/cueing for compensatory strategies;Patient able to self feed Compensations: Minimize environmental distractions;Slow rate;Small sips/bites;Other (Comment) (eat only when fully alert)    Other  Recommendations Oral Care Recommendations: Oral care BID   Follow up Recommendations None      Frequency and Duration min 1 x/week  1 week       Prognosis N/A     Swallow Study   General Date of Onset: 06/11/16 HPI: Peter Bruce is a 66  y.o. male with a PMH: r lupus AC and hx of PE on warfarin, PMR on pred 5, Atrial fibrillation, CAD, HTN, and recent diagnosis of tongue CA. He presented to hospital with fever and intermittent confusion and leg weakness. Type of Study: Bedside Swallow Evaluation Previous Swallow Assessment: N/A Diet Prior to this Study: NPO Temperature Spikes Noted: No Respiratory Status: Room air History of Recent Intubation: No Behavior/Cognition: Alert;Cooperative;Confused;Lethargic/Drowsy Oral Cavity Assessment: Within Functional Limits Oral Care Completed by SLP: No Vision: Functional for self-feeding Self-Feeding Abilities: Needs assist;Needs set up Patient Positioning: Upright in bed Baseline Vocal Quality: Normal Volitional Cough: Cognitively unable to elicit Volitional Swallow: Unable to elicit    Oral/Motor/Sensory Function Overall Oral Motor/Sensory Function: Within functional limits   Ice Chips     Thin Liquid Thin Liquid: Impaired Presentation: Cup;Straw Pharyngeal  Phase Impairments: Throat Clearing - Delayed Other Comments: Intermittent delayed dry cough with thin liquids, however suspect this is due to phlegm/mucous rather than aspiration/penetration. Wife who was present said that he has been very congested lately.    Nectar Thick   NT  Honey Thick   NT  Puree Puree: Within functional limits Presentation: Spoon   Solid  GO   Solid: Impaired Oral Phase Functional Implications: Prolonged oral transit       Sonia Baller, MA, CCC-SLP Speech Therapy [facility] Acute [facility]

## 2016-06-13 ENCOUNTER — Inpatient Hospital Stay (HOSPITAL_COMMUNITY): Payer: Medicare Other

## 2016-06-13 ENCOUNTER — Other Ambulatory Visit (HOSPITAL_COMMUNITY): Payer: Medicare Other

## 2016-06-13 DIAGNOSIS — I6340 Cerebral infarction due to embolism of unspecified cerebral artery: Secondary | ICD-10-CM

## 2016-06-13 LAB — COMPREHENSIVE METABOLIC PANEL
ALK PHOS: 43 U/L (ref 38–126)
ALT: 24 U/L (ref 17–63)
AST: 34 U/L (ref 15–41)
Albumin: 2.5 g/dL — ABNORMAL LOW (ref 3.5–5.0)
Anion gap: 5 (ref 5–15)
BUN: 20 mg/dL (ref 6–20)
CALCIUM: 8.5 mg/dL — AB (ref 8.9–10.3)
CO2: 26 mmol/L (ref 22–32)
CREATININE: 1.11 mg/dL (ref 0.61–1.24)
Chloride: 105 mmol/L (ref 101–111)
Glucose, Bld: 170 mg/dL — ABNORMAL HIGH (ref 65–99)
Potassium: 4.5 mmol/L (ref 3.5–5.1)
Sodium: 136 mmol/L (ref 135–145)
TOTAL PROTEIN: 5.9 g/dL — AB (ref 6.5–8.1)
Total Bilirubin: 0.4 mg/dL (ref 0.3–1.2)

## 2016-06-13 LAB — ECHOCARDIOGRAM COMPLETE
CHL CUP MV DEC (S): 296
CHL CUP TV REG PEAK VELOCITY: 324 cm/s
E decel time: 296 msec
FS: 27 % — AB (ref 28–44)
HEIGHTINCHES: 70 in
IV/PV OW: 0.86
LA diam end sys: 47 mm
LA vol A4C: 85.6 ml
LA vol index: 45 mL/m2
LA vol: 89.6 mL
LADIAMINDEX: 2.36 cm/m2
LASIZE: 47 mm
LV PW d: 11.4 mm — AB (ref 0.6–1.1)
LVELAT: 5.57 cm/s
LVOT MV VTI INDEX: 0.36 cm2/m2
LVOT MV VTI: 0.72
LVOT SV: 69 mL
LVOT VTI: 16.7 cm
LVOT area: 4.15 cm2
LVOT diameter: 23 mm
LVOTPV: 93.1 cm/s
Lateral S' vel: 11.1 cm/s
MV M vel: 226
MVANNULUSVTI: 96.2 cm
MVAP: 2.29 cm2
MVSPHT: 96 ms
Mean grad: 22 mmHg
RV TAPSE: 14 mm
RV sys press: 45 mmHg
TDI e' lateral: 5.57
TRMAXVEL: 324 cm/s
WEIGHTICAEL: 2811.31 [oz_av]

## 2016-06-13 LAB — PROTIME-INR
INR: 1.37
Prothrombin Time: 16.9 seconds — ABNORMAL HIGH (ref 11.4–15.2)

## 2016-06-13 LAB — CBC WITH DIFFERENTIAL/PLATELET
Basophils Absolute: 0 10*3/uL (ref 0.0–0.1)
Basophils Relative: 0 %
EOS PCT: 0 %
Eosinophils Absolute: 0 10*3/uL (ref 0.0–0.7)
HCT: 29 % — ABNORMAL LOW (ref 39.0–52.0)
HEMOGLOBIN: 9.7 g/dL — AB (ref 13.0–17.0)
LYMPHS ABS: 0.6 10*3/uL — AB (ref 0.7–4.0)
LYMPHS PCT: 2 %
MCH: 30.7 pg (ref 26.0–34.0)
MCHC: 33.4 g/dL (ref 30.0–36.0)
MCV: 91.8 fL (ref 78.0–100.0)
Monocytes Absolute: 0.5 10*3/uL (ref 0.1–1.0)
Monocytes Relative: 2 %
NEUTROS PCT: 96 %
Neutro Abs: 23.6 10*3/uL — ABNORMAL HIGH (ref 1.7–7.7)
Platelets: 99 10*3/uL — ABNORMAL LOW (ref 150–400)
RBC: 3.16 MIL/uL — AB (ref 4.22–5.81)
RDW: 13.5 % (ref 11.5–15.5)
WBC: 24.7 10*3/uL — AB (ref 4.0–10.5)

## 2016-06-13 LAB — LACTIC ACID, PLASMA: LACTIC ACID, VENOUS: 1 mmol/L (ref 0.5–1.9)

## 2016-06-13 LAB — MAGNESIUM: MAGNESIUM: 2.2 mg/dL (ref 1.7–2.4)

## 2016-06-13 MED ORDER — ASPIRIN 300 MG RE SUPP
300.0000 mg | Freq: Every day | RECTAL | Status: DC
  Filled 2016-06-13: qty 1

## 2016-06-13 MED ORDER — ASPIRIN 325 MG PO TABS
325.0000 mg | ORAL_TABLET | Freq: Every day | ORAL | Status: DC
  Administered 2016-06-13 – 2016-06-17 (×5): 325 mg via ORAL
  Filled 2016-06-13 (×5): qty 1

## 2016-06-13 MED ORDER — STROKE: EARLY STAGES OF RECOVERY BOOK
Freq: Once | Status: AC
  Administered 2016-06-13: 22:00:00
  Filled 2016-06-13 (×2): qty 1

## 2016-06-13 NOTE — Progress Notes (Signed)
VASCULAR LAB PRELIMINARY  PRELIMINARY  PRELIMINARY  PRELIMINARY  Carotid duplex completed.    Preliminary report:  Bilateral:  1-39% ICA stenosis lowest end of scale.  Vertebral artery flow is antegrade.   No significant change since study of 05/27/2015 for Pre - CABG evaluation.  Gwenda Heiner, RVS 06/13/2016, 1:45 PM

## 2016-06-13 NOTE — Evaluation (Signed)
Physical Therapy Evaluation Patient Details Name: Peter Bruce MRN: [id number] DOB: 03/13/1951 Today's Date: 06/13/2016   History of Present Illness  Patient is a 66 yo male admitted 06/11/16 with fever and confusion, and LE weakness.  Patient was found to be septic, and encephalopathic. 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  Per chart, poss embolic infective endocarditis.     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  Clinical Impression  Patient presents with problems listed below.  Will benefit from acute PT to maximize functional mobility prior to discharge.  At present functional/cognitive level, do not feel wife can care for patient.  Recommend SNF at d/c for continued therapy with goal to return home with wife.  Will continue to reassess.    Follow Up Recommendations SNF;Supervision/Assistance - 24 hour    Equipment Recommendations  None recommended by PT    Recommendations for Other Services       Precautions / Restrictions Precautions Precautions: Fall Precaution Comments: Recent falls at home.  Confusion. Restrictions Weight Bearing Restrictions: No      Mobility  Bed Mobility Overal bed mobility: Needs Assistance Bed Mobility: Rolling;Sidelying to Sit;Sit to Sidelying Rolling: Min guard Sidelying to sit: Mod assist     Sit to sidelying: Mod assist General bed mobility comments: Verbal cues for technique.  Use of rail to roll.  Assist to bring trunk up to sitting position.  Once upright, patient with fair balance.  Sat EOB x 8 minutes.  Required assist to control trunk descent and bring LE's onto bed to return to sidelying.  Required +2 assist to scoot to [facility] in supine.  Transfers Overall transfer level: Needs assistance Equipment used: 2 person hand held assist Transfers: Sit to/from  Stand Sit to Stand: Min assist;+2 safety/equipment         General transfer comment: Min assist to move to standing and for balance.  Patient with posterior lean in standing, requiring assist to maintain balance.  Patient able to step forward and backward with mod assist for balance.  Unsteady in stance.  Ambulation/Gait             General Gait Details: NT  Stairs            Wheelchair Mobility    Modified Rankin (Stroke Patients Only) Modified Rankin (Stroke Patients Only) Pre-Morbid Rankin Score: No symptoms Modified Rankin: Moderately severe disability     Balance Overall balance assessment: Needs assistance;History of Falls Sitting-balance support: No upper extremity supported;Feet supported Sitting balance-Leahy Scale: Fair     Standing balance support: Bilateral upper extremity supported Standing balance-Leahy Scale: Poor                               Pertinent Vitals/Pain Pain Assessment: Faces Faces Pain Scale: Hurts little more Pain Location: back Pain Descriptors / Indicators: Aching;Moaning;Grimacing Pain Intervention(s): Limited activity within patient's tolerance;Monitored during session;Repositioned    Home Living Family/patient expects to be discharged to:: Private residence Living Arrangements: Spouse/significant other Available Help at Discharge: Family;Available PRN/intermittently (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  Drives     Hand Dominance        Extremity/Trunk Assessment   Upper Extremity Assessment Upper Extremity Assessment: Generalized weakness;Defer to OT evaluation    Lower Extremity Assessment Lower  Extremity Assessment: Generalized weakness (Strength at least 4-/5 - difficult to MMT)       Communication   Communication: No difficulties  Cognition Arousal/Alertness: Lethargic Behavior During Therapy: Restless;Impulsive Overall Cognitive Status: Impaired/Different from baseline Area of Impairment: Orientation;Attention;Memory;Safety/judgement;Awareness;Problem solving Orientation Level: Disoriented to;Place;Situation Current Attention Level: Sustained Memory: Decreased short-term memory   Safety/Judgement: Decreased awareness of safety   Problem Solving: Slow processing;Difficulty sequencing;Requires verbal cues      General Comments      Exercises     Assessment/Plan    PT Assessment Patient needs continued PT services  PT Problem List Decreased strength;Decreased activity tolerance;Decreased balance;Decreased mobility;Decreased cognition;Decreased knowledge of use of DME;Decreased safety awareness;Pain          PT Treatment Interventions DME instruction;Gait training;Functional mobility training;Therapeutic activities;Therapeutic exercise;Balance training;Cognitive remediation;Patient/family education    PT Goals (Current goals can be found in the Care Plan section)  Acute Rehab PT Goals Patient Stated Goal: To go home - states repeatedly PT Goal Formulation: With family Time For Goal Achievement: 06/20/16 Potential to Achieve Goals: Fair    Frequency Min 3X/week   Barriers to discharge Decreased caregiver support Wife present on property, but running dog kennel.  Patient will need 24 hour assist at current functional level.    Co-evaluation               End of Session Equipment Utilized During Treatment: Gait belt Activity Tolerance: Patient limited by fatigue;Patient limited by lethargy Patient left: in bed;with call bell/phone within reach;with family/visitor present Nurse Communication: Mobility status (Decreased O2 sats when asleep as PT entered  room)         Time: [id number] PT Time Calculation (min) (ACUTE ONLY): 15 min   Charges:   PT Evaluation $PT Eval High Complexity: 1 Procedure     PT G Codes:        Despina Pole 2016/06/14, 4:46 PM Carita Pian. Sanjuana Kava, [place] Pager [phone]

## 2016-06-13 NOTE — Consult Note (Signed)
Requesting Physician: Dr. Posey Pronto    Chief Complaint:  Stroke  History obtained from:  Patient     HPI:                                                                                                                                         Peter Bruce is an 66 y.o. male  with a past medical history significant for lupus AC and hx of PE on warfarin, PMR on pred 5, Atrial fibrillation, CAD, HTN, and recent diagnosis of tongue CA who presents with fever and intermittent confusion and leg weakness. Patient was found to be septic while in the hospital. 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   Patietn is very encephalopathic at this time.  Albumin 2.5, WBC 24.7, INR 1.37, PLTS 99  Date last known well: Unable to determine Time last known well: Unable to determine tPA Given: No: no LSN and likely bacterial.    Past Medical History:  Diagnosis Date  . Acute pulmonary embolus ([place]) 2010  . Arthritis   . Atrial fibrillation [facility])    s/p inpatient DCCV 09/11/2015  . Atrial flutter ([place])   . CHF (congestive heart failure) ([place])   . Coronary artery disease involving native coronary artery 05/15/2015   multivessel  . Dysrhythmia    A-fib  . Essential hypertension   . Fibromyalgia   . History of kidney stones   . Hypercholesterolemia   . Hypertension   . Lupus anticoagulant positive 07/29/2008  . Mitral regurgitation 10/30/2015  . PMR (polymyalgia rheumatica) (HCC)   . Pneumonia   . 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 S/P Maze operation for atrial fibrillation 05/29/2015   Complete bilateral atrial lesion set via median sternotomy using bipolar radiofrequency and cryothermy ablation with clipping of LA appendage  . S/P minimally invasive mitral valve repair 11/21/2015   Complex valvuloplasty including artificial Gore-tex neochord placement x12 with 32 mm Sorin Memo  3D ring annuloplasty via right mini thoracotomy approach  . 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 APPENDECTOMY    . arm surgery  Right    wrist and elbow  . CARDIAC CATHETERIZATION N/A 05/15/2015   Procedure: Left Heart Cath and Coronary Angiography;  Surgeon: Peter M Martinique, MD;  Location: [place] [facility];  Service: Cardiovascular;  Laterality: N/A;  . CARDIAC CATHETERIZATION N/A 11/17/2015   Procedure: Right/Left Heart Cath and Coronary/Graft Angiography;  Surgeon: Adrian Prows, MD;  Location: [place] [facility];  Service: Cardiovascular;  Laterality: N/A;  . CARDIOVERSION N/A 09/11/2015   Procedure: CARDIOVERSION;  Surgeon: Lelon Perla, MD;  Location: [facility] ENDOSCOPY;  Service: Cardiovascular;  Laterality: N/A;  .  CHEST TUBE INSERTION  11/21/2015   Procedure: LEFT CHEST TUBE INSERTION;  Surgeon: Rexene Alberts, MD;  Location: [place];  Service: Open Heart Surgery;;  . CLIPPING OF ATRIAL APPENDAGE  05/29/2015   Procedure: CLIPPING OF LEFT ATRIAL APPENDAGE;  Surgeon: Rexene Alberts, MD;  Location: [place];  Service: Open Heart Surgery;;  45 AtriClip PRO 145  . CORONARY ARTERY BYPASS GRAFT N/A 05/29/2015   Procedure: CORONARY ARTERY BYPASS GRAFTING (CABG), ON PUMP, TIMES THREE, USING LEFT INTERNAL MAMMARY ARTERY, BILATERAL GREATER SAPHENOUS VEINS HARVESTED ENDOSCOPICALLY;  Surgeon: Rexene Alberts, MD;  Location: [place];  Service: Open Heart Surgery;  Laterality: N/A;  . FRACTURE SURGERY     right wrist  . HERNIA REPAIR    . MAZE N/A 05/29/2015   Procedure: MAZE;  Surgeon: Rexene Alberts, MD;  Location: [place];  Service: Open Heart Surgery;  Laterality: N/A;  Complete Bi-Atrial Lesion set with Ablation and Cryothermy  . MITRAL VALVE REPAIR Right 11/21/2015   Procedure: MINIMALLY INVASIVE REOPERATION FOR MITRAL VALVE REPAIR;  Surgeon: Rexene Alberts, MD;  Location: [place];  Service: Open Heart Surgery;  Laterality: Right;  . PERIPHERAL  VASCULAR CATHETERIZATION Bilateral 11/17/2015   Procedure: Renal Angiography;  Surgeon: Adrian Prows, MD;  Location: [place] [facility];  Service: Cardiovascular;  Laterality: Bilateral;  . PERIPHERAL VASCULAR CATHETERIZATION N/A 11/17/2015   Procedure: Abdominal Aortogram;  Surgeon: Adrian Prows, MD;  Location: [place] [facility];  Service: Cardiovascular;  Laterality: N/A;  . SHOULDER SURGERY Left    clavicular fracture  . TEE WITHOUT CARDIOVERSION N/A 05/29/2015   Procedure: TRANSESOPHAGEAL ECHOCARDIOGRAM (TEE);  Surgeon: Rexene Alberts, MD;  Location: [place];  Service: Open Heart Surgery;  Laterality: N/A;  . TEE WITHOUT CARDIOVERSION N/A 10/30/2015   Procedure: TRANSESOPHAGEAL ECHOCARDIOGRAM (TEE);  Surgeon: Adrian Prows, MD;  Location: [place];  Service: Cardiovascular;  Laterality: N/A;  . TEE WITHOUT CARDIOVERSION N/A 11/21/2015   Procedure: TRANSESOPHAGEAL ECHOCARDIOGRAM (TEE);  Surgeon: Rexene Alberts, MD;  Location: [place];  Service: Open Heart Surgery;  Laterality: N/A;    Family History  Problem Relation Age of Onset  . Cancer Mother   . Other Sister     chf  . Other Sister     Rosalee Kaufman   Social History:  reports that he has never smoked. He has never used smokeless tobacco. He reports that he does not drink alcohol or use drugs.  Allergies:  Allergies  Allergen Reactions  . Amitriptyline Shortness Of Breath  . Serotonin Reuptake Inhibitors (Ssris) Nausea And Vomiting    SSRI uptake makes him extremely sick.    Medications:                                                                                                                           Prior to Admission:  Prescriptions Prior to Admission  Medication Sig Dispense Refill Last Dose  . acetaminophen (TYLENOL) 500 MG tablet  Take 1,000 mg by mouth every 8 (eight) hours as needed for mild pain or moderate pain.   06/10/2016 at Unknown time  . amLODipine (NORVASC) 10 MG tablet Take 10 mg by mouth daily.   06/10/2016 at  Unknown time  . Ascorbic Acid (VITAMIN C) 100 MG tablet Take 100 mg by mouth daily.   06/10/2016 at Unknown time  . metoprolol succinate (TOPROL-XL) 25 MG 24 hr tablet Take 0.5 tablets (12.5 mg total) by mouth daily. 90 tablet 1 06/10/2016 at 0800  . Multiple Vitamin (MULTIVITAMIN) tablet Take 1 tablet by mouth daily.   06/10/2016 at Unknown time  . predniSONE (DELTASONE) 5 MG tablet Take 5 mg by mouth daily.   06/10/2016 at Unknown time  . valsartan (DIOVAN) 160 MG tablet Take 1 tablet (160 mg total) by mouth daily.   06/10/2016 at Unknown time  . warfarin (COUMADIN) 7.5 MG tablet Take 1 tablet (7.5 mg total) by mouth daily at 6 PM. (Patient taking differently: Take 5-7.5 mg by mouth See admin instructions. Pt takes 5mg  by mouth once daily in the evening except for Wednesdays, 7.5mg  by mouth Wednesdays) 30 tablet 3 06/10/2016 at 1900  . zolpidem (AMBIEN) 10 MG tablet Take 10 mg by mouth daily.    Past Month at Unknown time   Scheduled: .  stroke: mapping our early stages of recovery book   Does not apply Once  . acyclovir  10 mg/kg Intravenous Q8H  . aspirin  300 mg Rectal Daily   Or  . aspirin  325 mg Oral Daily  . cefTRIAXone (ROCEPHIN)  IV  2 g Intravenous Q12H  . dexamethasone  10 mg Intravenous Q6H  . QUEtiapine  25 mg Oral QHS  . sodium chloride flush  3 mL Intravenous Q12H    ROS:                                                                                                                                       Unable to obtain due to encephalopathy   Neurologic Examination:                                                                                                      Blood pressure 132/81, pulse 82, temperature 98.6 F (37 C), temperature source Oral, resp. rate 18, height 5\' 10"  (1.778 m), weight 79.7 kg (175 lb 11.3 oz), SpO2 99 %.  HEENT-  Normocephalic, no lesions, without obvious abnormality.  Normal external eye and conjunctiva.  Normal TM's bilaterally.  Normal auditory canals  and external ears. Normal external nose, mucus membranes and septum.  Normal pharynx. Cardiovascular- irregularly irregular rhythm, pulses palpable throughout   Lungs- chest clear, no wheezing, rales, normal symmetric air entry Abdomen- normal findings: bowel sounds normal Extremities- no edema Lymph-no adenopathy palpable Musculoskeletal-no joint tenderness, deformity or swelling Skin-warm and dry, no hyperpigmentation, vitiligo, or suspicious lesions  Neurological Examination Mental Status: Alert, not oriented,confused abut able to follow simple commands such as raising arms and simple neuro exam. minimal and not enough to tell if aphasic but no dysarthria noted.    Cranial Nerves: II:  Visual fields grossly normal,  III,IV, VI: ptosis not present, extra-ocular motions intact bilaterally, pupils equal, round, reactive to light and accommodation V,VII: smile symmetric, facial light touch sensation normal bilaterally VIII: hearing normal bilaterally IX,X: uvula rises symmetrically XI: bilateral shoulder shrug XII: midline tongue extension Motor: Right : Upper extremity   5/5    Left:     Upper extremity   5/5  Lower extremity   5/5     Lower extremity   5/5 Tone and bulk:normal tone throughout; no atrophy noted Sensory: Pinprick and light touch intact throughout, bilaterally Deep Tendon Reflexes: 1+ and symmetric throughout Plantars: Right: downgoing   Left: downgoing Cerebellar: normal finger-to-nose,  Gait: not tested       Lab Results: Basic Metabolic Panel:  Recent Labs Lab 06/11/16 1933 06/12/16 1118 06/13/16 0312  NA 132* 137 136  K 4.4 4.3 4.5  CL 98* 104 105  CO2  --  28 26  GLUCOSE 100* 183* 170*  BUN 21* 19 20  CREATININE 1.20 1.14 1.11  CALCIUM  --  8.4* 8.5*  MG  --   --  2.2    Liver Function Tests:  Recent Labs Lab 06/12/16 1118 06/13/16 0312  AST 39  38 34  ALT 23  23 24   ALKPHOS 47  48 43  BILITOT 0.7  0.6 0.4  PROT 6.2*  6.2* 5.9*   ALBUMIN 2.5*  2.5* 2.5*   No results for input(s): LIPASE, AMYLASE in the last 168 hours.  Recent Labs Lab 06/12/16 1118  AMMONIA <9*    CBC:  Recent Labs Lab 06/11/16 1035 06/11/16 1933 06/12/16 1118 06/13/16 0312  WBC 21.3*  --  16.2* 24.7*  NEUTROABS 19.2*  --  15.5* 23.6*  HGB 11.3* 10.9* 10.0* 9.7*  HCT 34.1* 32.0* 30.3* 29.0*  MCV 91.9  --  92.7 91.8  PLT 127*  --  91* 99*    Cardiac Enzymes: No results for input(s): CKTOTAL, CKMB, CKMBINDEX, TROPONINI in the last 168 hours.  Lipid Panel: No results for input(s): CHOL, TRIG, HDL, CHOLHDL, VLDL, LDLCALC in the last 168 hours.  CBG:  Recent Labs Lab 06/11/16 1011  GLUCAP 71    Microbiology: Results for orders placed or performed during the hospital encounter of 06/11/16  Culture, blood (Routine x 2)     Status: None (Preliminary result)   Collection Time: 06/11/16 10:40 AM  Result Value Ref Range Status   Specimen Description BLOOD RIGHT FOREARM  Final   Special Requests BOTTLES DRAWN AEROBIC AND ANAEROBIC 5CC  Final   Culture  Setup Time   Final    GRAM POSITIVE COCCI IN CHAINS IN BOTH AEROBIC AND ANAEROBIC BOTTLES CRITICAL RESULT CALLED TO, READ BACK BY AND VERIFIED WITH: E BARTON,PHARMD AT 0930 06/12/16 BY L BENFIELD    Culture GRAM POSITIVE COCCI  Final   Report Status  PENDING  Incomplete  Blood Culture ID Panel (Reflexed)     Status: Abnormal   Collection Time: 06/11/16 10:40 AM  Result Value Ref Range Status   Enterococcus species NOT DETECTED NOT DETECTED Final   Listeria monocytogenes NOT DETECTED NOT DETECTED Final   Staphylococcus species NOT DETECTED NOT DETECTED Final   Staphylococcus aureus NOT DETECTED NOT DETECTED Final   Streptococcus species DETECTED (A) NOT DETECTED Final    Comment: Not Enterococcus species, Streptococcus agalactiae, Streptococcus pyogenes, or Streptococcus pneumoniae. CRITICAL RESULT CALLED TO, READ BACK BY AND VERIFIED WITH: E BARTON,PHARMD AT 0930 06/12/16 BY L  BENFIELD    Streptococcus agalactiae NOT DETECTED NOT DETECTED Final   Streptococcus pneumoniae NOT DETECTED NOT DETECTED Final   Streptococcus pyogenes NOT DETECTED NOT DETECTED Final   Acinetobacter baumannii NOT DETECTED NOT DETECTED Final   Enterobacteriaceae species NOT DETECTED NOT DETECTED Final   Enterobacter cloacae complex NOT DETECTED NOT DETECTED Final   Escherichia coli NOT DETECTED NOT DETECTED Final   Klebsiella oxytoca NOT DETECTED NOT DETECTED Final   Klebsiella pneumoniae NOT DETECTED NOT DETECTED Final   Proteus species NOT DETECTED NOT DETECTED Final   Serratia marcescens NOT DETECTED NOT DETECTED Final   Haemophilus influenzae NOT DETECTED NOT DETECTED Final   Neisseria meningitidis NOT DETECTED NOT DETECTED Final   Pseudomonas aeruginosa NOT DETECTED NOT DETECTED Final   Candida albicans NOT DETECTED NOT DETECTED Final   Candida glabrata NOT DETECTED NOT DETECTED Final   Candida krusei NOT DETECTED NOT DETECTED Final   Candida parapsilosis NOT DETECTED NOT DETECTED Final   Candida tropicalis NOT DETECTED NOT DETECTED Final  Culture, blood (Routine x 2)     Status: None (Preliminary result)   Collection Time: 06/11/16 12:23 PM  Result Value Ref Range Status   Specimen Description BLOOD RIGHT ANTECUBITAL  Final   Special Requests BOTTLES DRAWN AEROBIC AND ANAEROBIC 5CC  Final   Culture  Setup Time   Final    GRAM POSITIVE COCCI IN CHAINS IN BOTH AEROBIC AND ANAEROBIC BOTTLES CRITICAL RESULT CALLED TO, READ BACK BY AND VERIFIED WITH: E BARTON,PHARMD AT 0930 06/12/16 BY L BENFIELD    Culture GRAM POSITIVE COCCI  Final   Report Status PENDING  Incomplete  Urine culture     Status: Abnormal   Collection Time: 06/11/16  4:36 PM  Result Value Ref Range Status   Specimen Description URINE, RANDOM  Final   Special Requests NONE  Final   Culture MULTIPLE SPECIES PRESENT, SUGGEST RECOLLECTION (A)  Final   Report Status 06/12/2016 FINAL  Final  MRSA PCR Screening      Status: None   Collection Time: 06/12/16  6:40 AM  Result Value Ref Range Status   MRSA by PCR NEGATIVE NEGATIVE Final    Comment:        The GeneXpert MRSA Assay (FDA approved for NASAL specimens only), is one component of a comprehensive MRSA colonization surveillance program. It is not intended to diagnose MRSA infection nor to guide or monitor treatment for MRSA infections.     Coagulation Studies:  Recent Labs  06/11/16 1219 06/12/16 1118 06/13/16 0312  LABPROT 22.8* 23.3* 16.9*  INR 1.98 2.04 1.37    Imaging: Dg Pelvis 1-2 Views  Result Date: 06/11/2016 CLINICAL DATA:  Pelvic pain.  Fall . EXAM: PELVIS - 1-2 VIEW COMPARISON:  CT 11/12/2015 . FINDINGS: Degenerative changes lumbar spine and both hips. No acute bony or joint abnormality identified. Clips in staples noted. Pelvic calcifications consistent  phleboliths. IMPRESSION: Degenerative changes lumbar spine and both hips. No acute abnormality. Electronically Signed   By: Marcello Moores  Register   On: 06/11/2016 15:24   Mr Brain Wo Contrast  Result Date: 06/12/2016 CLINICAL DATA:  Acute encephalopathy. History of lupus and pulmonary embolism. Recent diagnosis of tongue cancer. EXAM: MRI HEAD WITHOUT CONTRAST TECHNIQUE: Multiplanar, multiecho pulse sequences of the brain and surrounding structures were obtained without intravenous contrast. COMPARISON:  Head CT from yesterday FINDINGS: Brain: Scattered recent infarcts, including a moderate area of infarct in the lateral and anterior left temporal lobe. Smaller areas of infarction in the inferior left occipital cortex, right cerebellum, right posterior cerebral white matter, right caudate head, and bilateral parasagittal parietal cortex. Some of the smaller infarcts have weaker diffusion restriction and may be subacute. The left temporal and occipital infarcts have mild petechial hemorrhage based on gradient imaging. There is also superficial T1 hyperintensity at these infarcts.  Background chronic ischemic injury is overall mild. There has been a remote lacunar infarct in the right centrum semiovale. No hydrocephalus. History of tongue cancer. No overt mass. Intracranial screening would require contrast. Vascular: Preserved major flow voids. Skull and upper cervical spine: No marrow lesion noted. Sinuses/Orbits: Mastoid fluid on the right with negative nasopharynx. 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 Moderate area of infarction in the lateral left temporal lobe. Mild petechial hemorrhage. Electronically Signed   By: Monte Fantasia M.D.   On: 06/12/2016 16:41   Mr Cervical Spine W Or Wo Contrast  Result Date: 06/11/2016 CLINICAL DATA:  Leg weakness. History of recent tongue cancer diagnosis. EXAM: MRI TOTAL SPINE WITHOUT AND WITH CONTRAST TECHNIQUE: Multisequence MR imaging of the spine from the cervical spine to the sacrum was performed prior to and following IV contrast administration for evaluation of spinal metastatic disease. Multiplanar and multiecho pulse sequences of the thoracic and lumbar spine were obtained without and with intravenous contrast. Study is a metastatic screening survey anal E sagittal acquisitions were obtained. CONTRAST:  42mL MULTIHANCE GADOBENATE DIMEGLUMINE 529 MG/ML IV SOLN COMPARISON:  None. FINDINGS: MRI CERVICAL SPINE FINDINGS Alignment: No traumatic malalignment. Chronic C7-T1 anterolisthesis, facet mediated. Vertebrae: No fracture, evidence of discitis, or bone lesion. Cord:  Normal signal and morphology. Paraspinal and other soft tissues: The patient's tongue base/vallecular mass is partially visualized. Prevertebral fluid noted from C3-C4 without avid enhancement. This was not clearly seen on recent neck CT. There is disc T2 hyperintensity at C3-4 which appears continuous with the prevertebral fluid on one image. This may reflect an injury in this patient with multiple falls. No  marrow edema or erosion to suggest discitis. No posterior element injury noted. Disc levels: Lower cervical degenerative changes. Upper cervical predominant facet arthropathy. Spinal canal is patent. Foraminal stenoses not well evaluated without axials. MRI THORACIC SPINE FINDINGS Alignment:  Exaggerated thoracic kyphosis. Vertebrae: No evidence of metastasis, fracture, or discitis. Cord: Normal signal and morphology. No abnormal intrathecal enhancement. Paraspinal and other soft tissues: No acute finding Disc levels: Spondylosis. Patent spinal canal. No evidence of foraminal impingement. MRI LUMBAR SPINE FINDINGS Segmentation:  Standard. Alignment: Exaggerated lordosis. L2-3 translation and retrolisthesis. L4-5 anterolisthesis. Vertebrae:  No evidence of metastasis, acute fracture, or discitis. Conus medullaris: Extends to the L1-2 level and appears normal. Paraspinal and other soft tissues: Extensive colonic diverticulosis. Disc levels: Diffuse facet arthropathy and degenerative changes. Diffusely patent spinal canal. No high-grade foraminal impingement. IMPRESSION: 1. Negative for spinal metastasis or fracture. Diffusely patent spinal canal.  2. Small volume retropharyngeal fluid. The C3-4 disc is also T2 hyperintense. These changes could be posttraumatic in this patient with history of multiple falls; no evidence of acute fracture. No marrow edema as would be expected for discitis. Electronically Signed   By: Monte Fantasia M.D.   On: 06/11/2016 20:15   Mr Thoracic Spine W Wo Contrast  Result Date: 06/11/2016 CLINICAL DATA:  Leg weakness. History of recent tongue cancer diagnosis. EXAM: MRI TOTAL SPINE WITHOUT AND WITH CONTRAST TECHNIQUE: Multisequence MR imaging of the spine from the cervical spine to the sacrum was performed prior to and following IV contrast administration for evaluation of spinal metastatic disease. Multiplanar and multiecho pulse sequences of the thoracic and lumbar spine were obtained  without and with intravenous contrast. Study is a metastatic screening survey anal E sagittal acquisitions were obtained. CONTRAST:  62mL MULTIHANCE GADOBENATE DIMEGLUMINE 529 MG/ML IV SOLN COMPARISON:  None. FINDINGS: MRI CERVICAL SPINE FINDINGS Alignment: No traumatic malalignment. Chronic C7-T1 anterolisthesis, facet mediated. Vertebrae: No fracture, evidence of discitis, or bone lesion. Cord:  Normal signal and morphology. Paraspinal and other soft tissues: The patient's tongue base/vallecular mass is partially visualized. Prevertebral fluid noted from C3-C4 without avid enhancement. This was not clearly seen on recent neck CT. There is disc T2 hyperintensity at C3-4 which appears continuous with the prevertebral fluid on one image. This may reflect an injury in this patient with multiple falls. No marrow edema or erosion to suggest discitis. No posterior element injury noted. Disc levels: Lower cervical degenerative changes. Upper cervical predominant facet arthropathy. Spinal canal is patent. Foraminal stenoses not well evaluated without axials. MRI THORACIC SPINE FINDINGS Alignment:  Exaggerated thoracic kyphosis. Vertebrae: No evidence of metastasis, fracture, or discitis. Cord: Normal signal and morphology. No abnormal intrathecal enhancement. Paraspinal and other soft tissues: No acute finding Disc levels: Spondylosis. Patent spinal canal. No evidence of foraminal impingement. MRI LUMBAR SPINE FINDINGS Segmentation:  Standard. Alignment: Exaggerated lordosis. L2-3 translation and retrolisthesis. L4-5 anterolisthesis. Vertebrae:  No evidence of metastasis, acute fracture, or discitis. Conus medullaris: Extends to the L1-2 level and appears normal. Paraspinal and other soft tissues: Extensive colonic diverticulosis. Disc levels: Diffuse facet arthropathy and degenerative changes. Diffusely patent spinal canal. No high-grade foraminal impingement. IMPRESSION: 1. Negative for spinal metastasis or fracture.  Diffusely patent spinal canal. 2. Small volume retropharyngeal fluid. The C3-4 disc is also T2 hyperintense. These changes could be posttraumatic in this patient with history of multiple falls; no evidence of acute fracture. No marrow edema as would be expected for discitis. Electronically Signed   By: Monte Fantasia M.D.   On: 06/11/2016 20:15   Mr Lumbar Spine W Wo Contrast (assess For Abscess, Cord Compression)  Result Date: 06/11/2016 CLINICAL DATA:  Leg weakness. History of recent tongue cancer diagnosis. EXAM: MRI TOTAL SPINE WITHOUT AND WITH CONTRAST TECHNIQUE: Multisequence MR imaging of the spine from the cervical spine to the sacrum was performed prior to and following IV contrast administration for evaluation of spinal metastatic disease. Multiplanar and multiecho pulse sequences of the thoracic and lumbar spine were obtained without and with intravenous contrast. Study is a metastatic screening survey anal E sagittal acquisitions were obtained. CONTRAST:  90mL MULTIHANCE GADOBENATE DIMEGLUMINE 529 MG/ML IV SOLN COMPARISON:  None. FINDINGS: MRI CERVICAL SPINE FINDINGS Alignment: No traumatic malalignment. Chronic C7-T1 anterolisthesis, facet mediated. Vertebrae: No fracture, evidence of discitis, or bone lesion. Cord:  Normal signal and morphology. Paraspinal and other soft tissues: The patient's tongue base/vallecular mass is partially visualized.  Prevertebral fluid noted from C3-C4 without avid enhancement. This was not clearly seen on recent neck CT. There is disc T2 hyperintensity at C3-4 which appears continuous with the prevertebral fluid on one image. This may reflect an injury in this patient with multiple falls. No marrow edema or erosion to suggest discitis. No posterior element injury noted. Disc levels: Lower cervical degenerative changes. Upper cervical predominant facet arthropathy. Spinal canal is patent. Foraminal stenoses not well evaluated without axials. MRI THORACIC SPINE FINDINGS  Alignment:  Exaggerated thoracic kyphosis. Vertebrae: No evidence of metastasis, fracture, or discitis. Cord: Normal signal and morphology. No abnormal intrathecal enhancement. Paraspinal and other soft tissues: No acute finding Disc levels: Spondylosis. Patent spinal canal. No evidence of foraminal impingement. MRI LUMBAR SPINE FINDINGS Segmentation:  Standard. Alignment: Exaggerated lordosis. L2-3 translation and retrolisthesis. L4-5 anterolisthesis. Vertebrae:  No evidence of metastasis, acute fracture, or discitis. Conus medullaris: Extends to the L1-2 level and appears normal. Paraspinal and other soft tissues: Extensive colonic diverticulosis. Disc levels: Diffuse facet arthropathy and degenerative changes. Diffusely patent spinal canal. No high-grade foraminal impingement. IMPRESSION: 1. Negative for spinal metastasis or fracture. Diffusely patent spinal canal. 2. Small volume retropharyngeal fluid. The C3-4 disc is also T2 hyperintense. These changes could be posttraumatic in this patient with history of multiple falls; no evidence of acute fracture. No marrow edema as would be expected for discitis. Electronically Signed   By: Monte Fantasia M.D.   On: 06/11/2016 20:15       Assessment and plan discussed with with attending physician and they are in agreement.    Etta Quill PA-C [facility] [phone]  06/13/2016, 12:44 PM   On my exam, he has acalculia and finger agnosia(does get middle finger, but not others). He is unable to flex his neck towards his chest.    Assessment: 66 y.o. male with multifocal strokes, positive blood cultures, and neck stiffness. 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 He is already being covered empirically. Very low suspicion for herpes and would favor stopping this. Cardio embolic strokes are also possible given his history, but his overall picture I feel is more suggestive of septic source.   His INR is  1.37, but with mildly low platelets as well and fact that he is already empirically covered, I think that waiting for the INR to come down a little more would be more prudent.   Stroke Risk Factors - atrial fibrillation, hyperlipidemia and hypertension  1) TTE 2) If no vegitations seen, TEE 3) LP once INR < 1.3 4) agree with empiric meningitis coverage 5) ASA 325mg  daily 6) will follow.   Roland Rack, MD [facility] [phone]  If 7pm- 7am, please page neurology on call as listed in [place].

## 2016-06-13 NOTE — Progress Notes (Signed)
Pt has been confused today tries to get out of bed without assistance - had Haldol 2 mg IV twice today. Has tele sitter with prompts..Sat on BSC 4 times - sometimes  stands to void. Wife sat with pt for hours Late afternoon pt wanted to call sister cannot remember her number than says he wants to call "911" for no reason. Using  swear words at intervals - pulled off 02 sat monitor 4 times and takes off BP cuff wrapped IV sites with Kling wrap

## 2016-06-13 NOTE — Progress Notes (Signed)
[facility] Progress Note  Patient: Peter Bruce   PCP: Adrian Prows, MD DOB: 01/11/1951   DOA: 06/11/2016   DOS: 06/13/2016   Date of Service: the patient was seen and examined on 06/13/2016  Subjective:Denies any acute complaint, neck stiffness is actually improved. No headache. No acute events overnight.  Brief hospital course: Pt. 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 Currently further plan is continue IV antibiotics and further workup.  Assessment and Plan: 1. Other streptococcal sepsis ([employer]) Bacteremia with Streptococcus species. 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 MRSA PCR is negative. Based on this information currently I am narrowing antibiotics to ceftriaxone. Continuing acyclovir at present. INR 1.37, interventional radiology consulted for LP. S/P 5 mg IV vitamin K. Urine culture shows multiple species.  Blood culture will be repeated tomorrow.  Chest x-ray does not show any pneumonia. Echocardiogram currently pending. MRI shows multiple acute and subacute infarct concerning for embolic infective endocarditis. We will involve infectious disease tomorrow depending on the results of the echocardiogram.  2. History of pulmonary embolism   Paroxysmal atrial fibrillation (HCC) On Coumadin at home. Currently holding Coumadin in the setting of requirement for LP. We'll monitor daily INR. Resume Coumadin once at these performed. Will require bridge.  3. Multiple acute and subacute CVA. MRI brain moderate ADL infarct in left temporal lobe, also multiple smaller infarct in inferior left occipital, right cerebellum, right cerebral, right caudate impresses that'll cortex. This findings are  concerning for embolic phenomenon most likely septic emboli. Neurology consulted. Speech therapy has already evaluated the patient. PTOT consult. Echocardiogram currently pending. Aspirin ordered. Patient will resume anticoagulation after LP, pending recommendation from neurology. Vascular Doppler ordered as well.  4. Polymyalgia rheumatica--rheumatol [place]-2016 Worsening of the leukocytosis likely due to steroids On chronic prednisone at home, currently on IV Decadron. In the hospital.  5.  Chronic diastolic CHF (congestive heart failure) (HCC) EF 50-55%. I'm repeating an echocardiogram. Holding home Toprol-XL as well as amlodipine at present. While receiving IV hydration for sepsis monitor for volume overload.  6.  Essential hypertension Blood pressure currently soft. Currently holding home antihypertensive medications.  Bowel regimen: last BM 06/11/2016 Diet: CARDIAC DIET DVT Prophylaxis: warfarin  Advance goals of care discussion: full code  Family Communication: family was present at bedside, at the time of interview. The pt provided permission to discuss medical plan with the family. Opportunity was given to ask question and all questions were answered satisfactorily.   Disposition:  Discharge to SNF. Expected discharge date: 06/16/2016,  Consultants: IR Procedures: LP  Antibiotics: Anti-infectives    Start     Dose/Rate Route Frequency Ordered Stop   06/12/16 1100  vancomycin (VANCOCIN) IVPB 1000 mg/200 mL premix  Status:  Discontinued     1,000 mg 200 mL/hr over 60 Minutes Intravenous Every 12 hours 06/11/16 2214 06/12/16 0942   06/12/16 0300  ampicillin (OMNIPEN) 2 g in sodium chloride 0.9 % 50 mL IVPB  Status:  Discontinued     2 g 150 mL/hr over 20 Minutes Intravenous Every 4 hours 06/11/16 2214 06/12/16 0942   06/12/16 0045  acyclovir (ZOVIRAX) 770 mg in dextrose 5 % 150 mL IVPB     10 mg/kg  77.1 kg 165.4 mL/hr over 60 Minutes Intravenous Every  8 hours  06/12/16 0023     06/11/16 2215  cefTRIAXone (ROCEPHIN) 2 g in dextrose 5 % 50 mL IVPB     2 g 100 mL/hr over 30 Minutes Intravenous Every 12 hours 06/11/16 2204     06/11/16 2215  cefTRIAXone (ROCEPHIN) 2 g in dextrose 5 % 50 mL IVPB  Status:  Discontinued     2 g 100 mL/hr over 30 Minutes Intravenous  Once 06/11/16 2206 06/11/16 2207   06/11/16 2215  vancomycin (VANCOCIN) IVPB 1000 mg/200 mL premix  Status:  Discontinued     1,000 mg 200 mL/hr over 60 Minutes Intravenous  Once 06/11/16 2206 06/11/16 2207   06/11/16 2215  ampicillin (OMNIPEN) 2 g in sodium chloride 0.9 % 50 mL IVPB     2 g 150 mL/hr over 20 Minutes Intravenous  Once 06/11/16 2206 06/11/16 2325   06/11/16 2215  vancomycin (VANCOCIN) 1,500 mg in sodium chloride 0.9 % 500 mL IVPB     1,500 mg 250 mL/hr over 120 Minutes Intravenous  Once 06/11/16 2209 06/12/16 0121   06/11/16 2115  vancomycin (VANCOCIN) IVPB 1000 mg/200 mL premix  Status:  Discontinued     1,000 mg 200 mL/hr over 60 Minutes Intravenous  Once 06/11/16 2110 06/11/16 2209   06/11/16 2115  piperacillin-tazobactam (ZOSYN) IVPB 3.375 g  Status:  Discontinued     3.375 g 12.5 mL/hr over 240 Minutes Intravenous  Once 06/11/16 2110 06/11/16 2209        Objective: Physical Exam: Vitals:   06/13/16 0034 06/13/16 0426 06/13/16 0700 06/13/16 0800  BP: 106/84 116/68  132/81  Pulse: 81 75 82 82  Resp: (!) 23 19 16 18   Temp: 97 F (36.1 C) 97.7 F (36.5 C)  98.6 F (37 C)  TempSrc: Oral Axillary  Oral  SpO2: 100% 95% 98% 99%  Weight:      Height:        Intake/Output Summary (Last 24 hours) at 06/13/16 1208 Last data filed at 06/13/16 [id number]  Gross per 24 hour  Intake           2858.7 ml  Output              600 ml  Net           2258.7 ml   Filed Weights   06/11/16 1026 06/12/16 0639  Weight: 77.1 kg (170 lb) 79.7 kg (175 lb 11.3 oz)    General: Alert, Awake and Oriented to Time, Place and Person. Appear in moderate distress, affect  appropriate Eyes: PERRL, Conjunctiva normal ENT: Oral Mucosa clear moist. Neck: no JVD, no Abnormal Mass Or lumps Cardiovascular: S1 and S2 Present, aortic systolic Murmur, Respiratory: Bilateral Air entry equal and Decreased, no use of accessory muscle, Clear to Auscultation, no Crackles, no wheezes Abdomen: Bowel Sound present, Soft and no tenderness Skin: no redness, no Rash, no induration Extremities: no Pedal edema, no calf tenderness Neurologic: Grossly no focal neuro deficit. Bilaterally Equal motor strength  Data Reviewed: CBC:  Recent Labs Lab 06/11/16 1035 06/11/16 1933 06/12/16 1118 06/13/16 0312  WBC 21.3*  --  16.2* 24.7*  NEUTROABS 19.2*  --  15.5* 23.6*  HGB 11.3* 10.9* 10.0* 9.7*  HCT 34.1* 32.0* 30.3* 29.0*  MCV 91.9  --  92.7 91.8  PLT 127*  --  91* 99*   Basic Metabolic Panel:  Recent Labs Lab 06/11/16 1933 06/12/16 1118 06/13/16 0312  NA 132* 137 136  K 4.4 4.3 4.5  CL 98* 104 105  CO2  --  28 26  GLUCOSE 100* 183* 170*  BUN 21* 19 20  CREATININE 1.20 1.14 1.11  CALCIUM  --  8.4* 8.5*  MG  --   --  2.2    Liver Function Tests:  Recent Labs Lab 06/12/16 1118 06/13/16 0312  AST 39  38 34  ALT 23  23 24   ALKPHOS 47  48 43  BILITOT 0.7  0.6 0.4  PROT 6.2*  6.2* 5.9*  ALBUMIN 2.5*  2.5* 2.5*   No results for input(s): LIPASE, AMYLASE in the last 168 hours.  Recent Labs Lab 06/12/16 1118  AMMONIA <9*   Coagulation Profile:  Recent Labs Lab 06/11/16 1219 06/12/16 1118 06/13/16 0312  INR 1.98 2.04 1.37   Cardiac Enzymes: No results for input(s): CKTOTAL, CKMB, CKMBINDEX, TROPONINI in the last 168 hours. BNP (last 3 results) No results for input(s): PROBNP in the last 8760 hours.  CBG:  Recent Labs Lab 06/11/16 1011  GLUCAP 86    Studies: Mr Brain Wo Contrast  Result Date: 06/12/2016 CLINICAL DATA:  Acute encephalopathy. History of lupus and pulmonary embolism. Recent diagnosis of tongue cancer. EXAM: MRI HEAD  WITHOUT CONTRAST TECHNIQUE: Multiplanar, multiecho pulse sequences of the brain and surrounding structures were obtained without intravenous contrast. COMPARISON:  Head CT from yesterday FINDINGS: Brain: Scattered recent infarcts, including a moderate area of infarct in the lateral and anterior left temporal lobe. Smaller areas of infarction in the inferior left occipital cortex, right cerebellum, right posterior cerebral white matter, right caudate head, and bilateral parasagittal parietal cortex. Some of the smaller infarcts have weaker diffusion restriction and may be subacute. The left temporal and occipital infarcts have mild petechial hemorrhage based on gradient imaging. There is also superficial T1 hyperintensity at these infarcts. Background chronic ischemic injury is overall mild. There has been a remote lacunar infarct in the right centrum semiovale. No hydrocephalus. History of tongue cancer. No overt mass. Intracranial screening would require contrast. Vascular: Preserved major flow voids. Skull and upper cervical spine: No marrow lesion noted. Sinuses/Orbits: Mastoid fluid on the right with negative nasopharynx. 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 Moderate area of infarction in the lateral left temporal lobe. Mild petechial hemorrhage. Electronically Signed   By: Monte Fantasia M.D.   On: 06/12/2016 16:41     Scheduled Meds: . acyclovir  10 mg/kg Intravenous Q8H  . cefTRIAXone (ROCEPHIN)  IV  2 g Intravenous Q12H  . dexamethasone  10 mg Intravenous Q6H  . QUEtiapine  25 mg Oral QHS  . sodium chloride flush  3 mL Intravenous Q12H   Continuous Infusions: . sodium chloride 1,000 mL (06/13/16 0902)   PRN Meds: acetaminophen **OR** acetaminophen, haloperidol lactate, ondansetron **OR** ondansetron (ZOFRAN) IV, ondansetron (ZOFRAN) IV, oxyCODONE  Time spent: 30 minutes  Author: Berle Mull, MD [facility] Pager: [phone] 06/13/2016 12:08 PM  If 7PM-7AM, please contact night-coverage at [email or web address], password [facility]

## 2016-06-13 NOTE — Progress Notes (Signed)
  Echocardiogram 2D Echocardiogram has been performed.  Peter Bruce 06/13/2016, 4:07 PM

## 2016-06-14 ENCOUNTER — Inpatient Hospital Stay (HOSPITAL_COMMUNITY): Payer: Medicare Other

## 2016-06-14 ENCOUNTER — Encounter: Payer: Self-pay | Admitting: Physician Assistant

## 2016-06-14 DIAGNOSIS — M353 Polymyalgia rheumatica: Secondary | ICD-10-CM

## 2016-06-14 DIAGNOSIS — I480 Paroxysmal atrial fibrillation: Secondary | ICD-10-CM

## 2016-06-14 DIAGNOSIS — D6859 Other primary thrombophilia: Secondary | ICD-10-CM

## 2016-06-14 DIAGNOSIS — Z7952 Long term (current) use of systemic steroids: Secondary | ICD-10-CM

## 2016-06-14 DIAGNOSIS — Z8249 Family history of ischemic heart disease and other diseases of the circulatory system: Secondary | ICD-10-CM

## 2016-06-14 DIAGNOSIS — I6340 Cerebral infarction due to embolism of unspecified cerebral artery: Secondary | ICD-10-CM | POA: Insufficient documentation

## 2016-06-14 DIAGNOSIS — G9340 Encephalopathy, unspecified: Secondary | ICD-10-CM

## 2016-06-14 DIAGNOSIS — I38 Endocarditis, valve unspecified: Secondary | ICD-10-CM

## 2016-06-14 DIAGNOSIS — I2510 Atherosclerotic heart disease of native coronary artery without angina pectoris: Secondary | ICD-10-CM

## 2016-06-14 DIAGNOSIS — Z809 Family history of malignant neoplasm, unspecified: Secondary | ICD-10-CM

## 2016-06-14 DIAGNOSIS — D6862 Lupus anticoagulant syndrome: Secondary | ICD-10-CM

## 2016-06-14 DIAGNOSIS — T826XXA Infection and inflammatory reaction due to cardiac valve prosthesis, initial encounter: Secondary | ICD-10-CM

## 2016-06-14 DIAGNOSIS — I330 Acute and subacute infective endocarditis: Secondary | ICD-10-CM

## 2016-06-14 DIAGNOSIS — I748 Embolism and thrombosis of other arteries: Secondary | ICD-10-CM

## 2016-06-14 DIAGNOSIS — Z888 Allergy status to other drugs, medicaments and biological substances status: Secondary | ICD-10-CM

## 2016-06-14 DIAGNOSIS — R7881 Bacteremia: Secondary | ICD-10-CM

## 2016-06-14 DIAGNOSIS — Z7901 Long term (current) use of anticoagulants: Secondary | ICD-10-CM

## 2016-06-14 DIAGNOSIS — B955 Unspecified streptococcus as the cause of diseases classified elsewhere: Secondary | ICD-10-CM

## 2016-06-14 DIAGNOSIS — I76 Septic arterial embolism: Secondary | ICD-10-CM

## 2016-06-14 DIAGNOSIS — Z951 Presence of aortocoronary bypass graft: Secondary | ICD-10-CM

## 2016-06-14 DIAGNOSIS — Z820 Family history of epilepsy and other diseases of the nervous system: Secondary | ICD-10-CM

## 2016-06-14 DIAGNOSIS — B954 Other streptococcus as the cause of diseases classified elsewhere: Secondary | ICD-10-CM

## 2016-06-14 DIAGNOSIS — C01 Malignant neoplasm of base of tongue: Secondary | ICD-10-CM | POA: Insufficient documentation

## 2016-06-14 DIAGNOSIS — I4892 Unspecified atrial flutter: Secondary | ICD-10-CM

## 2016-06-14 LAB — CULTURE, BLOOD (ROUTINE X 2)

## 2016-06-14 LAB — LIPID PANEL
CHOL/HDL RATIO: 7.1 ratio
Cholesterol: 170 mg/dL (ref 0–200)
HDL: 24 mg/dL — AB (ref >40)
LDL CALC: 119 mg/dL — AB (ref 0–99)
Triglycerides: 133 mg/dL (ref <150)
VLDL: 27 mg/dL (ref 0–40)

## 2016-06-14 LAB — GLUCOSE, CAPILLARY: Glucose-Capillary: 147 mg/dL — ABNORMAL HIGH (ref 65–99)

## 2016-06-14 LAB — PROTIME-INR
INR: 1.3
Prothrombin Time: 16.3 s — ABNORMAL HIGH (ref 11.4–15.2)

## 2016-06-14 MED ORDER — RIVAROXABAN 20 MG PO TABS
20.0000 mg | ORAL_TABLET | Freq: Every day | ORAL | Status: DC
  Administered 2016-06-14 – 2016-06-15 (×2): 20 mg via ORAL
  Filled 2016-06-14 (×3): qty 1

## 2016-06-14 MED ORDER — PREDNISONE 10 MG PO TABS
10.0000 mg | ORAL_TABLET | Freq: Every day | ORAL | Status: DC
  Administered 2016-06-15 – 2016-06-17 (×3): 10 mg via ORAL
  Filled 2016-06-14 (×3): qty 1

## 2016-06-14 NOTE — Progress Notes (Signed)
STROKE TEAM PROGRESS NOTE   HISTORY OF PRESENT ILLNESS (per record) Peter Bruce is an 66 y.o. male with a past medical history significant for lupus AC and hx of PE on warfarin, PMR on pred 5, Atrial fibrillation, CAD, HTN, and recent diagnosis of tongue CAwho presents with fever and intermittent confusion and leg weakness. Patient was found to be septic while in the hospital. 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 Patient is very encephalopathic. His last know well in unable to be determined. Patient was not administered IV t-PA secondary to no LSN and likely bacterial etiology.   Albumin 2.5, WBC 24.7, INR 1.37, PLTS 99   SUBJECTIVE (INTERVAL HISTORY) His family is at the bedside.  Pt remains confused. ID saw pt with Dr. Leonie Bruce. Peter Bruce called and discussed with Dr. Einar Bruce.    OBJECTIVE Temp:  [97.3 F (36.3 C)-98.7 F (37.1 C)] 97.3 F (36.3 C) (02/05 0846) Pulse Rate:  [78-93] 80 (02/05 0846) Cardiac Rhythm: Sinus tachycardia (02/05 0700) Resp:  [15-20] 18 (02/05 0846) BP: (110-141)/(47-87) 112/87 (02/05 0846) SpO2:  [90 %-98 %] 98 % (02/05 0846) Weight:  [82 kg (180 lb 12.4 oz)] 82 kg (180 lb 12.4 oz) (02/05 0735)  CBC:  Recent Labs Lab 06/12/16 1118 06/13/16 0312  WBC 16.2* 24.7*  NEUTROABS 15.5* 23.6*  HGB 10.0* 9.7*  HCT 30.3* 29.0*  MCV 92.7 91.8  PLT 91* 99*    Basic Metabolic Panel:  Recent Labs Lab 06/12/16 1118 06/13/16 0312  NA 137 136  K 4.3 4.5  CL 104 105  CO2 28 26  GLUCOSE 183* 170*  BUN 19 20  CREATININE 1.14 1.11  CALCIUM 8.4* 8.5*  MG  --  2.2    Lipid Panel:    Component Value Date/Time   CHOL 170 06/14/2016 0235   TRIG 133 06/14/2016 0235   HDL 24 (L) 06/14/2016 0235   CHOLHDL 7.1 06/14/2016 0235   VLDL 27 06/14/2016 0235   LDLCALC 119 (H) 06/14/2016 0235   HgbA1c:  Lab Results  Component Value Date   HGBA1C 5.6 11/19/2015   Urine Drug Screen: No results found for:  LABOPIA, COCAINSCRNUR, LABBENZ, AMPHETMU, THCU, LABBARB    IMAGING  Mr Brain Wo Contrast 06/12/2016 Multiple acute and subacute infarcts as described; the pattern favors central embolic disease. Moderate area of infarction in the lateral left temporal lobe. Mild petechial hemorrhage.   Carotid Doppler   There is 1-39% bilateral ICA stenosis. Vertebral artery flow is antegrade.    2D Echocardiogram  - Left ventricle: The cavity size was normal. Systolic function was  normal. The estimated ejection fraction was in the range of 55% to 60%. The study is not technically sufficient to allow evaluation of LV diastolic function. - Ventricular septum: Septal motion showed paradox. These changes are consistent with a post-thoracotomy state. - Aortic valve: There was mild regurgitation. - Mitral valve: Prior procedures included surgical repair. 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 Severe thickening of the anterior leaflet. There was a large, fixed most probably a vegetation on the left ventricular aspect of the anterior leaflet; the appearance is consistent with vegetation. Transvalvular velocity was abnormal, due to stenosis. The findings are consistent with moderate to severe stenosis. Mean gradient (D): 22 mm Hg. Valve area by pressure half-time: 2.29 cm^2. Indexed valve area by pressure half-time: 1.15 cm^2/m^2. Valve area by continuity equation (using LVOT flow): 0.72 cm^2. - Left atrium: The atrium was moderately  to severely dilated. - Right atrium: The atrium was mildly dilated. - Atrial septum: No defect or patent foramen ovale was identified. - Pulmonary arteries: PA peak pressure: 45 mm Hg (S). Impressions:  The right ventricular systolic pressure was increased consistent with moderate pulmonary hypertension.   PHYSICAL EXAM Pleasant middle-age Caucasian male currently not in distress. . Afebrile. Head is nontraumatic. Neck is supple without bruit.    Cardiac  exam no murmur or gallop. Lungs are clear to auscultation. Distal pulses are well felt. Neurological Exam :  Awake alert confused. Oriented to place and person only. Follows simple midline and one-step commands only. No aphasia or apraxia dysarthria. Extraocular moments are full range without nystagmus. Blinks to threat bilaterally. No facial weakness. Tongue midline. Motor system exam reveals no upper or lower extremity drift but not cooperative for detailed exam. No focal weakness noted. Deep tendon flexes are symmetric. Plantars are downgoing. Gait was not tested.  ASSESSMENT/PLAN [name] is a 66 y.o. male with history of lupus AC and hx of PE on warfarin, PMR on pred 5, Atrial fibrillation, CAD, HTN, and recent diagnosis of tongue CA presenting with fever and intermittent confusion and leg weakness. He did not receive IV t-PA due to unknown LKW and likely bacterial presentation.   Stroke:  Multiple embolic infarcts, multiple etiologies including known atrial fibrillation, endocarditis. Doubt relationship with recently diagnosed oral cancer  MRI  Multiple acute and subacute infarcts    Carotid Doppler  No significant stenosis   2D Echo  LV vegetation  LDL 119  HgbA1c 5.6  SCDs for VTE prophylaxis  Diet regular Room service appropriate? Yes with Assist; Fluid consistency: Thin  warfarin daily prior to admission, now on No antithrombotic given plans for LP . Ok to start now per [place]. Will do xarelto. 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 Order written  Recommend Continuation anticoagulation at discharge    Hypertension  Stable  Permissive hypertension (OK if < 220/120) but gradually normalize in 5-7 days  Long-term BP goal normotensive  Hyperlipidemia  Home meds:  crestor 20  Resume crestor when able  LDL 119, goal < 70  Continue statin at discharge  Other Stroke Risk Factors  Advanced age  Overweight, Body mass index is 25.94 kg/m., recommend weight loss, diet and exercise as appropriate   Coronary artery disease s/p CABG 05/2015 and MAZE  CHF  Lupus anticoagulant  Hx PE on warfarin  Other Active Problems  PMR on prednisone  Recent diagnosis of tongue cancer   Agree low suspicion for herpes.  Hospital day # [place] for Pager information 06/14/2016 12:21 PM  I have personally examined this patient, reviewed notes, independently viewed imaging studies, participated in medical decision making and plan of care.ROS completed by me personally and pertinent positives fully documented  I have made any additions or clarifications directly  to the above note. Agree with note above. Patient has presented with confusion secondary to small bi-cerebral embolic infarcts in etiology possibly related to endocarditis versus atrial fibrillation. Doubt relationship with colon cancer. Discussed case with cardiologist Dr. Einar Bruce and Dr. Posey Pronto. Recommend change warfarin 2 eliquis Xarelto for anticoagulation. No need for spinal tap has clinical suspicion for meningitis is low. Greater than 50% time during this 35 minute visit was spent on counseling and coordination of care about his confusion, embolic strokes and answering questions  Antony Contras, [place] Pager: [phone] 06/14/2016 4:41  PM  To contact Stroke Continuity provider, please refer to [email or web address] After hours, contact General Neurology

## 2016-06-14 NOTE — Plan of Care (Signed)
Problem: Coping: Goal: Ability to verbalize positive feelings about self will improve Outcome: Progressing Patient has exhibited less confusion and forgetfulness; answering month and year incorrectly. Otherwise is now oriented to person, place, and situation. Patient asks appropriate questions; however still acts impulsively if needing to use bedside commode. Ensure wife is in the room at all times, or bed alarm is activated and telesitter is monitoring patient.

## 2016-06-14 NOTE — Progress Notes (Signed)
Responded to page to create advanced directive (AD). Visited w/ pt and wife in rm, explaining options on form. They will think about/discuss it further. Wife will be pt's healthcare agent, and said he was much better. But pt seemed tired, and said he only wanted to discuss AD for a few minutes today. Couple thanked me for overview of form. Advised them they can ask nurse to page for a chaplain at any time pt is ready to complete it. Also provided emotional/spiritual support and prayer -- which they appreciated. Chaplain available for f/u.   06/14/16 1300  Clinical Encounter Type  Visited With Patient and family together  Visit Type Initial;Psychological support;Spiritual support;Social support  Referral From Nurse  Spiritual Encounters  Spiritual Needs Brochure;Prayer;Emotional  Stress Factors  Patient Stress Factors Health changes;Loss of control  Family Stress Factors Family relationships;Health changes;Loss of control   Gerrit Heck, Chaplain

## 2016-06-14 NOTE — Plan of Care (Signed)
Problem: Education: Goal: Knowledge of disease or condition will improve Outcome: Not Progressing Patient is confused and unable to retain education information about disease process.

## 2016-06-14 NOTE — Evaluation (Signed)
Occupational Therapy Evaluation Patient Details Name: Peter Bruce MRN: [id number] DOB: 1951-01-17 Today's Date: 06/14/2016    History of Present Illness Patient is a 66 yo male admitted 06/11/16 with fever and confusion, and LE weakness.  Patient was found to be septic, and encephalopathic. 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  Per chart, poss embolic infective endocarditis.     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   Clinical Impression   Pt admitted with above. He demonstrates the below listed deficits and will benefit from continued OT to maximize safety and independence with BADLs.  Pt presents to OT with impaired cognition, decreased balance, dysmetria Lt UE, generalized weakness.  He is able to perform ADLs at min A - mod A level.  Based on comparison between performance on OT eval and PT eval yesterday, it appears that he has improved as he was requiring +2 assist yesterday. If wife able to provide 24 hour supervision at discharge, feel he may benefit from CIR.  Will follow. .        Follow Up Recommendations  CIR;Supervision/Assistance - 24 hour    Equipment Recommendations  3 in 1 bedside commode    Recommendations for Other Services Rehab consult     Precautions / Restrictions Precautions Precautions: Fall Precaution Comments: Recent falls at home.  Confusion.      Mobility Bed Mobility Overal bed mobility: Needs Assistance Bed Mobility: Supine to Sit;Sit to Supine   Sidelying to sit: Min assist Supine to sit: Min guard     General bed mobility comments: assist to lift trunk   Transfers Overall transfer level: Needs assistance Equipment used: 1 person hand held assist Transfers: Sit to/from [facility] Sit to Stand: Min assist Stand pivot transfers: Min  assist       General transfer comment: min A for balance     Balance Overall balance assessment: Needs assistance;History of Falls Sitting-balance support: Feet unsupported Sitting balance-Leahy Scale: Good Sitting balance - Comments: able to don/doff socks EOB    Standing balance support: During functional activity;No upper extremity supported Standing balance-Leahy Scale: Fair Standing balance comment: able to reach within BOS with each UE                             ADL Overall ADL's : Needs assistance/impaired Eating/Feeding: Supervision/ safety;Sitting;Set up   Grooming: Wash/dry hands;Wash/dry face;Oral care;Brushing hair;Supervision/safety;Sitting;Set up   Upper Body Bathing: Minimal assistance;Sitting   Lower Body Bathing: Moderate assistance;Sit to/from stand   Upper Body Dressing : Minimal assistance;Sitting   Lower Body Dressing: Moderate assistance;Sit to/from stand   Toilet Transfer: Moderate assistance;Ambulation;Comfort height toilet;BSC;Grab bars;RW   Toileting- Clothing Manipulation and Hygiene: Moderate assistance;Sit to/from stand       Functional mobility during ADLs: Minimal assistance;Moderate assistance;Rolling walker       Vision Vision Assessment?: Yes Eye Alignment: Within Functional Limits Ocular Range of Motion: Within Functional Limits Alignment/Gaze Preference: Within Defined Limits Tracking/Visual Pursuits: Able to track stimulus in all quads without difficulty Visual Fields: No apparent deficits Additional Comments: able to read clock on wall    Perception Perception Perception Tested?: Yes   Praxis Praxis Praxis tested?: Within functional limits    Pertinent Vitals/Pain Pain Assessment: No/denies pain  Hand Dominance Right   Extremity/Trunk Assessment Upper Extremity Assessment Upper Extremity Assessment: LUE deficits/detail LUE Deficits / Details: dysmetric  LUE Sensation: decreased proprioception LUE  Coordination: decreased fine motor;decreased gross motor   Lower Extremity Assessment Lower Extremity Assessment: Defer to PT evaluation       Communication Communication Communication: No difficulties   Cognition Arousal/Alertness: Awake/alert Behavior During Therapy: Flat affect Overall Cognitive Status: Impaired/Different from baseline Area of Impairment: Attention;Memory;Safety/judgement;Following commands;Problem solving   Current Attention Level: Selective Memory: Decreased short-term memory Following Commands: Follows one step commands consistently;Follows multi-step commands inconsistently Safety/Judgement: Decreased awareness of safety;Decreased awareness of deficits   Problem Solving: Slow processing;Decreased initiation;Difficulty sequencing;Requires verbal cues;Requires tactile cues     General Comments       Exercises       Shoulder Instructions      Home Living Family/patient expects to be discharged to:: Private residence Living Arrangements: Spouse/significant other Available Help at Discharge: Family;Available PRN/intermittently Type of Home: House Home Access: Stairs to enter [employer] of Steps: 3 Entrance Stairs-Rails: Right;Left Home Layout: One level               Home Equipment: Walker - 2 wheels;Cane - single point          Prior Functioning/Environment Level of Independence: Independent        Comments: Very active.  Drives        OT Problem List: Decreased strength;Decreased activity tolerance;Impaired balance (sitting and/or standing);Decreased coordination;Decreased cognition;Decreased safety awareness;Decreased knowledge of use of DME or AE;Impaired UE functional use   OT Treatment/Interventions: Self-care/ADL training;Neuromuscular education;DME and/or AE instruction;Therapeutic activities;Cognitive remediation/compensation;Patient/family education;Balance training    OT Goals(Current goals can be found in the  care plan section) Acute Rehab OT Goals Patient Stated Goal: to go home  OT Goal Formulation: With patient Time For Goal Achievement: 06/28/16 Potential to Achieve Goals: Good  OT Frequency: Min 2X/week   Barriers to D/C: Decreased caregiver support          Co-evaluation              End of Session Nurse Communication: Mobility status  Activity Tolerance: Patient tolerated treatment well Patient left: in bed;with call bell/phone within reach;with bed alarm set   Time: [id number] OT Time Calculation (min): 24 min Charges:  OT General Charges $OT Visit: 1 Procedure OT Evaluation $OT Eval Moderate Complexity: 1 Procedure OT Treatments $Neuromuscular Re-education: 8-22 mins G-Codes:    Peter Bruce M 30-Jun-2016, 7:30 PM

## 2016-06-14 NOTE — Consult Note (Signed)
Date of Admission:  06/11/2016  Date of Consult:  06/14/2016  Reason for Consult: Strep viridans bacteremia Referring Physician: Dr. Berle Mull   HPI: Peter Bruce is an 66 y.o. male. with CAD s/p CABG, paroxysmal atrial fibrillation and atrial flutter s/p MAZE procedure and on chronic anticoagulation, mitral regurgitation s/p valve repair, polymyalgia rheumatica on chronic steroid therapy, lupus anticoagulant positivity hospitalized for Strep viridans bacteremia. As he was confused at the time of consult, information was collected from chart review and confirmed with his wife by phone.   On 06/10/16, he underwent biopsy of a lesion of the right oral cavity by ENT. He did not receive antibiotic prophylaxis as he wanted to have the biopsy done in the office. Unfortunately, he developed fever, weakness, poor appetite and presented to the ED on 06/11/16. He was started empirically on vancomycin, piperacillin/tazobactam, acyclovir, ampicillin which was narrowed to ceftriaxone and acyclovir. C/T/L spine MR imaging was without findings of diskitis and osteomyelitis, but brain MRI was notable for multiple acute and subacute infarcts suggestive of central embolus with left lateral temporal lobe infarction. TTE yesterday suggested a vegetation of the left ventricular aspect of the anterior leaflet. ID was consulted for further management.  He has not had a prior history of endocarditis or bacteremia. He has been on prednisone 5 mg from his rheumatologist since being diagnosed with PMR though was off of it for several days prior to his presentation. At baseline, he is able to perform all maintenance and upkeep on his 3-acre property at home and was involved in power lifting, wrestling, and football in his younger days. He has no prior history of tobacco, alcohol, illicit drug use.   On interview, he cannot recall the details surrounding his hospitalization and prefers to go home. He denies any dyspnea, cough,  chest pain, headache, dizziness, weakness, abdominal pain, nausea, vomiting, diarrhea, dysuria, polyuria, rash, swelling.    Past Medical History:  Diagnosis Date  . Acute pulmonary embolus ([place]) 2010  . Arthritis   . Atrial fibrillation [facility])    s/p inpatient DCCV 09/11/2015  . Atrial flutter ([place])   . CHF (congestive heart failure) ([place])   . Coronary artery disease involving native coronary artery 05/15/2015   multivessel  . Dysrhythmia    A-fib  . Essential hypertension   . Fibromyalgia   . History of kidney stones   . Hypercholesterolemia   . Hypertension   . Lupus anticoagulant positive 07/29/2008  . Mitral regurgitation 10/30/2015  . PMR (polymyalgia rheumatica) (HCC)   . Pneumonia   . 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 S/P Maze operation for atrial fibrillation 05/29/2015   Complete bilateral atrial lesion set via median sternotomy using bipolar radiofrequency and cryothermy ablation with clipping of LA appendage  . 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 APPENDECTOMY    . arm surgery  Right    wrist and elbow  . CARDIAC CATHETERIZATION N/A 05/15/2015   Procedure: Left Heart Cath and Coronary Angiography;  Surgeon: Peter M Martinique, MD;  Location: [place] CV LAB;  Service: Cardiovascular;  Laterality: N/A;  .  CARDIAC CATHETERIZATION N/A 11/17/2015   Procedure: Right/Left Heart Cath and Coronary/Graft Angiography;  Surgeon: Adrian Prows, MD;  Location: [place] CV LAB;  Service: Cardiovascular;  Laterality: N/A;  . CARDIOVERSION N/A 09/11/2015   Procedure: CARDIOVERSION;  Surgeon: Lelon Perla, MD;  Location: [facility] ENDOSCOPY;  Service:  Cardiovascular;  Laterality: N/A;  . CHEST TUBE INSERTION  11/21/2015   Procedure: LEFT CHEST TUBE INSERTION;  Surgeon: Rexene Alberts, MD;  Location: [place];  Service: Open Heart Surgery;;  . CLIPPING OF ATRIAL APPENDAGE  05/29/2015   Procedure: CLIPPING OF LEFT ATRIAL APPENDAGE;  Surgeon: Rexene Alberts, MD;  Location: [place];  Service: Open Heart Surgery;;  45 AtriClip PRO 145  . CORONARY ARTERY BYPASS GRAFT N/A 05/29/2015   Procedure: CORONARY ARTERY BYPASS GRAFTING (CABG), ON PUMP, TIMES THREE, USING LEFT INTERNAL MAMMARY ARTERY, BILATERAL GREATER SAPHENOUS VEINS HARVESTED ENDOSCOPICALLY;  Surgeon: Rexene Alberts, MD;  Location: [place];  Service: Open Heart Surgery;  Laterality: N/A;  . FRACTURE SURGERY     right wrist  . HERNIA REPAIR    . MAZE N/A 05/29/2015   Procedure: MAZE;  Surgeon: Rexene Alberts, MD;  Location: [place];  Service: Open Heart Surgery;  Laterality: N/A;  Complete Bi-Atrial Lesion set with Ablation and Cryothermy  . MITRAL VALVE REPAIR Right 11/21/2015   Procedure: MINIMALLY INVASIVE REOPERATION FOR MITRAL VALVE REPAIR;  Surgeon: Rexene Alberts, MD;  Location: [place];  Service: Open Heart Surgery;  Laterality: Right;  . PERIPHERAL VASCULAR CATHETERIZATION Bilateral 11/17/2015   Procedure: Renal Angiography;  Surgeon: Adrian Prows, MD;  Location: [place] CV LAB;  Service: Cardiovascular;  Laterality: Bilateral;  . PERIPHERAL VASCULAR CATHETERIZATION N/A 11/17/2015   Procedure: Abdominal Aortogram;  Surgeon: Adrian Prows, MD;  Location: [place] CV LAB;  Service: Cardiovascular;  Laterality: N/A;  . SHOULDER SURGERY Left    clavicular fracture  . TEE WITHOUT CARDIOVERSION N/A 05/29/2015   Procedure: TRANSESOPHAGEAL ECHOCARDIOGRAM (TEE);  Surgeon: Rexene Alberts, MD;  Location: [place];  Service: Open Heart Surgery;  Laterality: N/A;  . TEE WITHOUT CARDIOVERSION N/A 10/30/2015   Procedure: TRANSESOPHAGEAL ECHOCARDIOGRAM (TEE);  Surgeon: Adrian Prows, MD;  Location: [place];   Service: Cardiovascular;  Laterality: N/A;  . TEE WITHOUT CARDIOVERSION N/A 11/21/2015   Procedure: TRANSESOPHAGEAL ECHOCARDIOGRAM (TEE);  Surgeon: Rexene Alberts, MD;  Location: [place];  Service: Open Heart Surgery;  Laterality: N/A;    Social History:  reports that he has never smoked. He has never used smokeless tobacco. He reports that he does not drink alcohol or use drugs.   Family History  Problem Relation Age of Onset  . Cancer Mother   . Other Sister     chf  . Other Sister     Rosalee Kaufman    Allergies  Allergen Reactions  . Amitriptyline Shortness Of Breath  . Serotonin Reuptake Inhibitors (Ssris) Nausea And Vomiting    SSRI uptake makes him extremely sick.     Medications: I have reviewed patients current medications as documented in Epic Anti-infectives    Start     Dose/Rate Route Frequency Ordered Stop   06/12/16 1100  vancomycin (VANCOCIN) IVPB 1000 mg/200 mL premix  Status:  Discontinued     1,000 mg 200 mL/hr over 60 Minutes Intravenous Every 12 hours 06/11/16 2214 06/12/16 0942   06/12/16 0300  ampicillin (OMNIPEN) 2 g in sodium chloride 0.9 % 50 mL IVPB  Status:  Discontinued     2  g 150 mL/hr over 20 Minutes Intravenous Every 4 hours 06/11/16 2214 06/12/16 0942   06/12/16 0045  acyclovir (ZOVIRAX) 770 mg in dextrose 5 % 150 mL IVPB  Status:  Discontinued     10 mg/kg  77.1 kg 165.4 mL/hr over 60 Minutes Intravenous Every 8 hours 06/12/16 0023 06/14/16 0902   06/11/16 2215  cefTRIAXone (ROCEPHIN) 2 g in dextrose 5 % 50 mL IVPB     2 g 100 mL/hr over 30 Minutes Intravenous Every 12 hours 06/11/16 2204     06/11/16 2215  cefTRIAXone (ROCEPHIN) 2 g in dextrose 5 % 50 mL IVPB  Status:  Discontinued     2 g 100 mL/hr over 30 Minutes Intravenous  Once 06/11/16 2206 06/11/16 2207   06/11/16 2215  vancomycin (VANCOCIN) IVPB 1000 mg/200 mL premix  Status:  Discontinued     1,000 mg 200 mL/hr over 60 Minutes Intravenous  Once 06/11/16 2206 06/11/16 2207    06/11/16 2215  ampicillin (OMNIPEN) 2 g in sodium chloride 0.9 % 50 mL IVPB     2 g 150 mL/hr over 20 Minutes Intravenous  Once 06/11/16 2206 06/11/16 2325   06/11/16 2215  vancomycin (VANCOCIN) 1,500 mg in sodium chloride 0.9 % 500 mL IVPB     1,500 mg 250 mL/hr over 120 Minutes Intravenous  Once 06/11/16 2209 06/12/16 0121   06/11/16 2115  vancomycin (VANCOCIN) IVPB 1000 mg/200 mL premix  Status:  Discontinued     1,000 mg 200 mL/hr over 60 Minutes Intravenous  Once 06/11/16 2110 06/11/16 2209   06/11/16 2115  piperacillin-tazobactam (ZOSYN) IVPB 3.375 g  Status:  Discontinued     3.375 g 12.5 mL/hr over 240 Minutes Intravenous  Once 06/11/16 2110 06/11/16 2209      ROS: As noted in HPI otherwise remainder of 12 point Review of Systems is negative.   Blood pressure 112/87, pulse 80, temperature 97.3 F (36.3 C), temperature source Oral, resp. rate 18, height 5' 10"  (1.778 m), weight 175 lb 11.3 oz (79.7 kg), SpO2 98 %. General: middle aged Caucasian male, sitting on commode HEENT: anicteric sclera,  EOMI, mass noted on the right oral cavity Cardiovascular: regular rate, normal rate,  no murmur rubs or gallops Pulmonary: clear to auscultation bilaterally, no wheezing, rales or rhonchi Musculoskeletal: no  clubbing or edema noted bilaterally Skin, soft tissue: no rashes Neuro: oriented to name and place but not year [2000], fine motor ability intact as evident by his ability to brush his teeth, 5/5 upper and lower extremity strength, CN 2-12 intact   Results for orders placed or performed during the hospital encounter of 06/11/16 (from the past 48 hour(s))  CBC with Differential/Platelet     Status: Abnormal   Collection Time: 06/12/16 11:18 AM  Result Value Ref Range   WBC 16.2 (H) 4.0 - 10.5 K/uL   RBC 3.27 (L) 4.22 - 5.81 MIL/uL   Hemoglobin 10.0 (L) 13.0 - 17.0 g/dL   HCT 30.3 (L) 39.0 - 52.0 %   MCV 92.7 78.0 - 100.0 fL   MCH 30.6 26.0 - 34.0 pg   MCHC 33.0 30.0 - 36.0  g/dL   RDW 13.6 11.5 - 15.5 %   Platelets 91 (L) 150 - 400 K/uL    Comment: SPECIMEN CHECKED FOR CLOTS REPEATED TO VERIFY PLATELET COUNT CONFIRMED BY SMEAR    Neutrophils Relative % 96 %   Neutro Abs 15.5 (H) 1.7 - 7.7 K/uL   Lymphocytes Relative 2 %  Lymphs Abs 0.4 (L) 0.7 - 4.0 K/uL   Monocytes Relative 2 %   Monocytes Absolute 0.3 0.1 - 1.0 K/uL   Eosinophils Relative 0 %   Eosinophils Absolute 0.0 0.0 - 0.7 K/uL   Basophils Relative 0 %   Basophils Absolute 0.0 0.0 - 0.1 K/uL  Comprehensive metabolic panel     Status: Abnormal   Collection Time: 06/12/16 11:18 AM  Result Value Ref Range   Sodium 137 135 - 145 mmol/L   Potassium 4.3 3.5 - 5.1 mmol/L   Chloride 104 101 - 111 mmol/L   CO2 28 22 - 32 mmol/L   Glucose, Bld 183 (H) 65 - 99 mg/dL   BUN 19 6 - 20 mg/dL   Creatinine, Ser 1.14 0.61 - 1.24 mg/dL   Calcium 8.4 (L) 8.9 - 10.3 mg/dL   Total Protein 6.2 (L) 6.5 - 8.1 g/dL   Albumin 2.5 (L) 3.5 - 5.0 g/dL   AST 38 15 - 41 U/L   ALT 23 17 - 63 U/L   Alkaline Phosphatase 48 38 - 126 U/L   Total Bilirubin 0.6 0.3 - 1.2 mg/dL   GFR calc non Af Amer >60 >60 mL/min   GFR calc Af Amer >60 >60 mL/min    Comment: (NOTE) The eGFR has been calculated using the CKD EPI equation. This calculation has not been validated in all clinical situations. eGFR's persistently <60 mL/min signify possible Chronic Kidney Disease.    Anion gap 5 5 - 15  Protime-INR     Status: Abnormal   Collection Time: 06/12/16 11:18 AM  Result Value Ref Range   Prothrombin Time 23.3 (H) 11.4 - 15.2 seconds   INR 2.04   Vitamin B12     Status: None   Collection Time: 06/12/16 11:18 AM  Result Value Ref Range   Vitamin B-12 831 180 - 914 pg/mL    Comment: (NOTE) This assay is not validated for testing neonatal or myeloproliferative syndrome specimens for Vitamin B12 levels.   Ammonia     Status: Abnormal   Collection Time: 06/12/16 11:18 AM  Result Value Ref Range   Ammonia <9 (L) 9 - 35 umol/L   TSH     Status: None   Collection Time: 06/12/16 11:18 AM  Result Value Ref Range   TSH 1.071 0.350 - 4.500 uIU/mL    Comment: Performed by a 3rd Generation assay with a functional sensitivity of <=0.01 uIU/mL.  Hepatic function panel     Status: Abnormal   Collection Time: 06/12/16 11:18 AM  Result Value Ref Range   Total Protein 6.2 (L) 6.5 - 8.1 g/dL   Albumin 2.5 (L) 3.5 - 5.0 g/dL   AST 39 15 - 41 U/L   ALT 23 17 - 63 U/L   Alkaline Phosphatase 47 38 - 126 U/L   Total Bilirubin 0.7 0.3 - 1.2 mg/dL   Bilirubin, Direct 0.2 0.1 - 0.5 mg/dL   Indirect Bilirubin 0.5 0.3 - 0.9 mg/dL  Protime-INR     Status: Abnormal   Collection Time: 06/13/16  3:12 AM  Result Value Ref Range   Prothrombin Time 16.9 (H) 11.4 - 15.2 seconds   INR 1.37   CBC with Differential/Platelet     Status: Abnormal   Collection Time: 06/13/16  3:12 AM  Result Value Ref Range   WBC 24.7 (H) 4.0 - 10.5 K/uL   RBC 3.16 (L) 4.22 - 5.81 MIL/uL   Hemoglobin 9.7 (L) 13.0 - 17.0 g/dL  HCT 29.0 (L) 39.0 - 52.0 %   MCV 91.8 78.0 - 100.0 fL   MCH 30.7 26.0 - 34.0 pg   MCHC 33.4 30.0 - 36.0 g/dL   RDW 13.5 11.5 - 15.5 %   Platelets 99 (L) 150 - 400 K/uL    Comment: CONSISTENT WITH PREVIOUS RESULT   Neutrophils Relative % 96 %   Neutro Abs 23.6 (H) 1.7 - 7.7 K/uL   Lymphocytes Relative 2 %   Lymphs Abs 0.6 (L) 0.7 - 4.0 K/uL   Monocytes Relative 2 %   Monocytes Absolute 0.5 0.1 - 1.0 K/uL   Eosinophils Relative 0 %   Eosinophils Absolute 0.0 0.0 - 0.7 K/uL   Basophils Relative 0 %   Basophils Absolute 0.0 0.0 - 0.1 K/uL  Comprehensive metabolic panel     Status: Abnormal   Collection Time: 06/13/16  3:12 AM  Result Value Ref Range   Sodium 136 135 - 145 mmol/L   Potassium 4.5 3.5 - 5.1 mmol/L   Chloride 105 101 - 111 mmol/L   CO2 26 22 - 32 mmol/L   Glucose, Bld 170 (H) 65 - 99 mg/dL   BUN 20 6 - 20 mg/dL   Creatinine, Ser 1.11 0.61 - 1.24 mg/dL   Calcium 8.5 (L) 8.9 - 10.3 mg/dL   Total Protein 5.9  (L) 6.5 - 8.1 g/dL   Albumin 2.5 (L) 3.5 - 5.0 g/dL   AST 34 15 - 41 U/L   ALT 24 17 - 63 U/L   Alkaline Phosphatase 43 38 - 126 U/L   Total Bilirubin 0.4 0.3 - 1.2 mg/dL   GFR calc non Af Amer >60 >60 mL/min   GFR calc Af Amer >60 >60 mL/min    Comment: (NOTE) The eGFR has been calculated using the CKD EPI equation. This calculation has not been validated in all clinical situations. eGFR's persistently <60 mL/min signify possible Chronic Kidney Disease.    Anion gap 5 5 - 15  Magnesium     Status: None   Collection Time: 06/13/16  3:12 AM  Result Value Ref Range   Magnesium 2.2 1.7 - 2.4 mg/dL  Lactic acid, plasma     Status: None   Collection Time: 06/13/16  3:12 AM  Result Value Ref Range   Lactic Acid, Venous 1.0 0.5 - 1.9 mmol/L  Culture, blood (routine x 2)     Status: None (Preliminary result)   Collection Time: 06/14/16  2:35 AM  Result Value Ref Range   Specimen Description BLOOD LEFT ANTECUBITAL    Special Requests BOTTLES DRAWN AEROBIC AND ANAEROBIC 6CC EA    Culture PENDING    Report Status PENDING   Lipid panel     Status: Abnormal   Collection Time: 06/14/16  2:35 AM  Result Value Ref Range   Cholesterol 170 0 - 200 mg/dL   Triglycerides 133 <150 mg/dL   HDL 24 (L) >40 mg/dL   Total CHOL/HDL Ratio 7.1 RATIO   VLDL 27 0 - 40 mg/dL   LDL Cholesterol 119 (H) 0 - 99 mg/dL    Comment:        Total Cholesterol/HDL:CHD Risk Coronary Heart Disease Risk Table                     Men   Women  1/2 Average Risk   3.4   3.3  Average Risk       5.0   4.4  2 X Average Risk  9.6   7.1  3 X Average Risk  23.4   11.0        Use the calculated Patient Ratio above and the CHD Risk Table to determine the patient's CHD Risk.        ATP III CLASSIFICATION (LDL):  <100     mg/dL   Optimal  100-129  mg/dL   Near or Above                    Optimal  130-159  mg/dL   Borderline  160-189  mg/dL   High  >190     mg/dL   Very High   Protime-INR     Status: Abnormal    Collection Time: 06/14/16  2:37 AM  Result Value Ref Range   Prothrombin Time 16.3 (H) 11.4 - 15.2 seconds   INR 1.30   Glucose, capillary     Status: Abnormal   Collection Time: 06/14/16  8:45 AM  Result Value Ref Range   Glucose-Capillary 147 (H) 65 - 99 mg/dL   Comment 1 Notify RN    Comment 2 Document in Chart    @BRIEFLABTABLE (sdes,specrequest,cult,reptstatus)   ) Recent Results (from the past 720 hour(s))  Culture, blood (Routine x 2)     Status: Abnormal (Preliminary result)   Collection Time: 06/11/16 10:40 AM  Result Value Ref Range Status   Specimen Description BLOOD RIGHT FOREARM  Final   Special Requests BOTTLES DRAWN AEROBIC AND ANAEROBIC 5CC  Final   Culture  Setup Time   Final    GRAM POSITIVE COCCI IN CHAINS IN BOTH AEROBIC AND ANAEROBIC BOTTLES CRITICAL RESULT CALLED TO, READ BACK BY AND VERIFIED WITH: E BARTON,PHARMD AT 0930 06/12/16 BY L BENFIELD    Culture (A)  Final    VIRIDANS STREPTOCOCCUS SUSCEPTIBILITIES TO FOLLOW    Report Status PENDING  Incomplete  Blood Culture ID Panel (Reflexed)     Status: Abnormal   Collection Time: 06/11/16 10:40 AM  Result Value Ref Range Status   Enterococcus species NOT DETECTED NOT DETECTED Final   Listeria monocytogenes NOT DETECTED NOT DETECTED Final   Staphylococcus species NOT DETECTED NOT DETECTED Final   Staphylococcus aureus NOT DETECTED NOT DETECTED Final   Streptococcus species DETECTED (A) NOT DETECTED Final    Comment: Not Enterococcus species, Streptococcus agalactiae, Streptococcus pyogenes, or Streptococcus pneumoniae. CRITICAL RESULT CALLED TO, READ BACK BY AND VERIFIED WITH: E BARTON,PHARMD AT 0930 06/12/16 BY L BENFIELD    Streptococcus agalactiae NOT DETECTED NOT DETECTED Final   Streptococcus pneumoniae NOT DETECTED NOT DETECTED Final   Streptococcus pyogenes NOT DETECTED NOT DETECTED Final   Acinetobacter baumannii NOT DETECTED NOT DETECTED Final   Enterobacteriaceae species NOT DETECTED NOT DETECTED  Final   Enterobacter cloacae complex NOT DETECTED NOT DETECTED Final   Escherichia coli NOT DETECTED NOT DETECTED Final   Klebsiella oxytoca NOT DETECTED NOT DETECTED Final   Klebsiella pneumoniae NOT DETECTED NOT DETECTED Final   Proteus species NOT DETECTED NOT DETECTED Final   Serratia marcescens NOT DETECTED NOT DETECTED Final   Haemophilus influenzae NOT DETECTED NOT DETECTED Final   Neisseria meningitidis NOT DETECTED NOT DETECTED Final   Pseudomonas aeruginosa NOT DETECTED NOT DETECTED Final   Candida albicans NOT DETECTED NOT DETECTED Final   Candida glabrata NOT DETECTED NOT DETECTED Final   Candida krusei NOT DETECTED NOT DETECTED Final   Candida parapsilosis NOT DETECTED NOT DETECTED Final   Candida tropicalis NOT DETECTED NOT DETECTED Final  Culture,  blood (Routine x 2)     Status: Abnormal (Preliminary result)   Collection Time: 06/11/16 12:23 PM  Result Value Ref Range Status   Specimen Description BLOOD RIGHT ANTECUBITAL  Final   Special Requests BOTTLES DRAWN AEROBIC AND ANAEROBIC 5CC  Final   Culture  Setup Time   Final    GRAM POSITIVE COCCI IN CHAINS IN BOTH AEROBIC AND ANAEROBIC BOTTLES CRITICAL RESULT CALLED TO, READ BACK BY AND VERIFIED WITH: E BARTON,PHARMD AT 0930 06/12/16 BY L BENFIELD    Culture VIRIDANS STREPTOCOCCUS (A)  Final   Report Status PENDING  Incomplete  Urine culture     Status: Abnormal   Collection Time: 06/11/16  4:36 PM  Result Value Ref Range Status   Specimen Description URINE, RANDOM  Final   Special Requests NONE  Final   Culture MULTIPLE SPECIES PRESENT, SUGGEST RECOLLECTION (A)  Final   Report Status 06/12/2016 FINAL  Final  MRSA PCR Screening     Status: None   Collection Time: 06/12/16  6:40 AM  Result Value Ref Range Status   MRSA by PCR NEGATIVE NEGATIVE Final    Comment:        The GeneXpert MRSA Assay (FDA approved for NASAL specimens only), is one component of a comprehensive MRSA colonization surveillance program. It is  not intended to diagnose MRSA infection nor to guide or monitor treatment for MRSA infections.   Culture, blood (routine x 2)     Status: None (Preliminary result)   Collection Time: 06/14/16  2:35 AM  Result Value Ref Range Status   Specimen Description BLOOD LEFT ANTECUBITAL  Final   Special Requests BOTTLES DRAWN AEROBIC AND ANAEROBIC 6CC EA  Final   Culture PENDING  Incomplete   Report Status PENDING  Incomplete     Impression/Recommendation  Principal Problem:   Other streptococcal sepsis ([place]) Active Problems:   History of pulmonary embolism   Paroxysmal atrial fibrillation (HCC)   Polymyalgia rheumatica--rheumatol [place]-2016   Chronic diastolic CHF (congestive heart failure) ([place])   Essential hypertension   Hyponatremia   Normocytic anemia   Peter Bruce is a 66 y.o. 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   -Continue ceftriaxone IV. Speak with lab about confirming penicillin MIC sensitivity as 0.12 is the cutoff for whether or not gentamicin is indicated with ceftriaxone. -Order TEE to assess extent of mass -LP deferred as the likely source is Strep viridans -Cardiology following, appreciate recommendations   Cerebral emboli: Suspect 2/2 endocarditis though lupus anticoagulant positivity is a hypercoagauable state for which reversal of anticoagulation is not without risks. He received Vitamin K 5 mg IV on 2/3, and INR is now 1.3 with plan to transition to Xarelto. -Discuss with Hem about risks and benefits given the risk of bleeding associated with septic emboli  Polymyalgia rheumatica on chronic  steroid therapy: His wife reports he has been off steroids for several days prior to hospitalization, but he has been on steroids for months so cannot ignore risk of precipitating adrenal crisis with sudden discontinuation of medication against the risk of immunosuppresion. -Switch dexamethasone 10 mg every 6 hours to prednisone 10 mg or hydrocortisone 10 mg every 6 hours [if unable to tolerate oral intake]. Conversion  for dexamethasone 40 mg would be prednisone 125 mg which is more than double his regular regimen.   06/14/2016, 9:13 AM  Thank you so much for this interesting consult  [place] for [place] (pager) [phone] (office) 06/14/2016, 9:13 AM  Posey Pronto, [place] 06/14/2016, 9:13 AM

## 2016-06-14 NOTE — Progress Notes (Signed)
OT Cancellation Note  Patient Details Name: Peter Bruce MRN: [id number] DOB: 1950-11-04   Cancelled Treatment:    Reason Eval/Treat Not Completed: Patient at procedure or test/ unavailable.  Will try back.  Tremell Reimers [place], OTR/L [id number]   Lucille Passy M 06/14/2016, 1:52 PM

## 2016-06-14 NOTE — Progress Notes (Signed)
[facility] Progress Note  Patient: Peter Bruce [id number]   PCP: Adrian Prows, MD DOB: [birth year]   DOA: 06/11/2016   DOS: 06/14/2016   Date of Service: the patient was seen and examined on 06/14/2016  Subjective:Denies any acute complaint, neck stiffness is actually improved. No headache. No acute events overnight.  Brief hospital course: Pt. 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 Currently further plan is continue IV antibiotics and further workup.  Assessment and Plan: 1. 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  MRSA PCR is negative. Echocardiogram shows mitral valve vegetation. Initial plan was to perform an LP, currently neurology as well as infectious disease does not feel that the patient requires LP, agree with that. Sensitivities were checked, currently on IV ceftriaxone. ID considering further input regarding antibiotics. Repeat culture currently pending. Initially was on ampicillin and vancomycin and acyclovir, all discontinued currently. For LP the patient has received 5 mg IV vitamin K. Urine culture shows multiple species.  Chest x-ray does not show any pneumonia. ID recommends TEE, I will discuss with cardiology  2. History of pulmonary embolism   Paroxysmal atrial fibrillation (HCC) On Coumadin at home. Is on hold and patient received IV vitamin K for LP. Currently neurology and cardiology feels that the patient be started on Xarelto. Agree with that. Continue monitoring. Appreciate input.  3. Multiple acute and subacute CVA. MRI brain moderate infarct in left temporal lobe, also multiple smaller infarct  in inferior left occipital, right cerebellum, right cerebral, right caudate impresses that'll cortex. This findings are concerning for embolic phenomenon most likely septic emboli. Neurology consulted. Speech therapy has already evaluated the patient. PTOT consult. Vascular Doppler unremarkable for any significant stenosis. Started on Xarelto  4. Polymyalgia rheumatica--rheumatol [place]-2016 Worsening of the leukocytosis likely due to steroids On chronic prednisone at home, changing to 10 mg prednisone here in the hospital. Received IV Decadron for suspected meningitis on presentation.  5.  Chronic diastolic CHF (congestive heart failure) ([place]) 6.  Essential hypertension EF 50-55%. While receiving IV hydration for sepsis monitor for volume overload. Holding home Toprol-XL as well as amlodipine at present. Blood pressure currently soft. Currently holding home antihypertensive medications.  Bowel regimen: last BM 06/11/2016 Diet: CARDIAC DIET DVT Prophylaxis: Xarelto  Advance goals of care discussion: full code  Family Communication: no family was present at bedside, at the time of interview.   Disposition:  Discharge to SNF. Expected discharge date: 06/16/2016,  Consultants: IR, infectious disease, neurology, cardiology Procedures: LP  Antibiotics: Anti-infectives    Start     Dose/Rate Route Frequency Ordered Stop   06/12/16 1100  vancomycin (VANCOCIN) IVPB 1000 mg/200 mL premix  Status:  Discontinued     1,000 mg 200 mL/hr over 60 Minutes Intravenous Every 12 hours 06/11/16 2214 06/12/16 0942   06/12/16 0300  ampicillin (OMNIPEN) 2 g in sodium chloride 0.9 % 50 mL IVPB  Status:  Discontinued     2 g 150 mL/hr over 20 Minutes Intravenous Every 4 hours 06/11/16 2214 06/12/16 0942   06/12/16 0045  acyclovir (ZOVIRAX) 770 mg in dextrose 5 % 150 mL IVPB  Status:  Discontinued     10 mg/kg  77.1 kg 165.4 mL/hr  over 60 Minutes Intravenous Every 8 hours 06/12/16 0023 06/14/16  0902   06/11/16 2215  cefTRIAXone (ROCEPHIN) 2 g in dextrose 5 % 50 mL IVPB     2 g 100 mL/hr over 30 Minutes Intravenous Every 12 hours 06/11/16 2204     06/11/16 2215  cefTRIAXone (ROCEPHIN) 2 g in dextrose 5 % 50 mL IVPB  Status:  Discontinued     2 g 100 mL/hr over 30 Minutes Intravenous  Once 06/11/16 2206 06/11/16 2207   06/11/16 2215  vancomycin (VANCOCIN) IVPB 1000 mg/200 mL premix  Status:  Discontinued     1,000 mg 200 mL/hr over 60 Minutes Intravenous  Once 06/11/16 2206 06/11/16 2207   06/11/16 2215  ampicillin (OMNIPEN) 2 g in sodium chloride 0.9 % 50 mL IVPB     2 g 150 mL/hr over 20 Minutes Intravenous  Once 06/11/16 2206 06/11/16 2325   06/11/16 2215  vancomycin (VANCOCIN) 1,500 mg in sodium chloride 0.9 % 500 mL IVPB     1,500 mg 250 mL/hr over 120 Minutes Intravenous  Once 06/11/16 2209 06/12/16 0121   06/11/16 2115  vancomycin (VANCOCIN) IVPB 1000 mg/200 mL premix  Status:  Discontinued     1,000 mg 200 mL/hr over 60 Minutes Intravenous  Once 06/11/16 2110 06/11/16 2209   06/11/16 2115  piperacillin-tazobactam (ZOSYN) IVPB 3.375 g  Status:  Discontinued     3.375 g 12.5 mL/hr over 240 Minutes Intravenous  Once 06/11/16 2110 06/11/16 2209        Objective: Physical Exam: Vitals:   06/14/16 0400 06/14/16 0735 06/14/16 0846 06/14/16 1215  BP: (!) 141/86  112/87 127/77  Pulse: 93  80 82  Resp: 19  18 20   Temp:   97.3 F (36.3 C) 97.5 F (36.4 C)  TempSrc:   Oral Oral  SpO2: 90%  98% 94%  Weight:  82 kg (180 lb 12.4 oz)    Height:        Intake/Output Summary (Last 24 hours) at 06/14/16 1605 Last data filed at 06/14/16 0956  Gross per 24 hour  Intake          3950.12 ml  Output             1430 ml  Net          2520.12 ml   Filed Weights   06/11/16 1026 06/12/16 0639 06/14/16 0735  Weight: 77.1 kg (170 lb) 79.7 kg (175 lb 11.3 oz) 82 kg (180 lb 12.4 oz)    General: Alert, Awake and Oriented to Time, Place and Person. Appear in moderate distress,  affect appropriate Eyes: PERRL, Conjunctiva normal ENT: Oral Mucosa clear moist. Neck: no JVD, no Abnormal Mass Or lumps Cardiovascular: S1 and S2 Present, aortic systolic Murmur, Respiratory: Bilateral Air entry equal and Decreased, no use of accessory muscle, Clear to Auscultation, no Crackles, no wheezes Abdomen: Bowel Sound present, Soft and no tenderness Skin: no redness, no Rash, no induration Extremities: no Pedal edema, no calf tenderness Neurologic: Grossly no focal neuro deficit. Bilaterally Equal motor strength  Data Reviewed: CBC:  Recent Labs Lab 06/11/16 1035 06/11/16 1933 06/12/16 1118 06/13/16 0312  WBC 21.3*  --  16.2* 24.7*  NEUTROABS 19.2*  --  15.5* 23.6*  HGB 11.3* 10.9* 10.0* 9.7*  HCT 34.1* 32.0* 30.3* 29.0*  MCV 91.9  --  92.7 91.8  PLT 127*  --  91* 99*   Basic Metabolic Panel:  Recent Labs Lab 06/11/16 1933 06/12/16 1118 06/13/16  0312  NA 132* 137 136  K 4.4 4.3 4.5  CL 98* 104 105  CO2  --  28 26  GLUCOSE 100* 183* 170*  BUN 21* 19 20  CREATININE 1.20 1.14 1.11  CALCIUM  --  8.4* 8.5*  MG  --   --  2.2    Liver Function Tests:  Recent Labs Lab 06/12/16 1118 06/13/16 0312  AST 39  38 34  ALT 23  23 24   ALKPHOS 47  48 43  BILITOT 0.7  0.6 0.4  PROT 6.2*  6.2* 5.9*  ALBUMIN 2.5*  2.5* 2.5*   No results for input(s): LIPASE, AMYLASE in the last 168 hours.  Recent Labs Lab 06/12/16 1118  AMMONIA <9*   Coagulation Profile:  Recent Labs Lab 06/11/16 1219 06/12/16 1118 06/13/16 0312 06/14/16 0237  INR 1.98 2.04 1.37 1.30   Cardiac Enzymes: No results for input(s): CKTOTAL, CKMB, CKMBINDEX, TROPONINI in the last 168 hours. BNP (last 3 results) No results for input(s): PROBNP in the last 8760 hours.  CBG:  Recent Labs Lab 06/11/16 1011 06/14/16 0845  GLUCAP 86 147*    Studies: No results found.   Scheduled Meds: . aspirin  300 mg Rectal Daily   Or  . aspirin  325 mg Oral Daily  . cefTRIAXone  (ROCEPHIN)  IV  2 g Intravenous Q12H  . [START ON 06/15/2016] predniSONE  10 mg Oral Q breakfast  . QUEtiapine  25 mg Oral QHS  . rivaroxaban  20 mg Oral Daily  . sodium chloride flush  3 mL Intravenous Q12H   Continuous Infusions: . sodium chloride 125 mL/hr at 06/14/16 0205   PRN Meds: acetaminophen **OR** acetaminophen, haloperidol lactate, ondansetron **OR** ondansetron (ZOFRAN) IV, ondansetron (ZOFRAN) IV, oxyCODONE  Time spent: 30 minutes  Author: Berle Mull, MD [facility] Pager: [phone] 06/14/2016 4:05 PM  If 7PM-7AM, please contact night-coverage at [email or web address], password [facility]

## 2016-06-14 NOTE — Consult Note (Signed)
CARDIOLOGY CONSULT NOTE  Patient ID: Peter Bruce MRN: [id number] DOB/AGE: February 23, 1951 66 y.o.  Admit date: 06/11/2016 Referring Physician  Berle Mull Primary Physician:  Adrian Prows, MD Reason for Consultation  Endocarditis  HPI: Peter Bruce  is a 66 y.o. male  With History of known coronary artery disease and did undergone CABG 3 on 05/29/2015. 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 He had been doing well until about 2 weeks ago had complained of generalized weakness and was seen in office. His wife had detected that he has a oral lesion and hence was referred for ENT evaluation. A large 3 cm exophytic mass at the right lateral oropharyngeal wall was noted and a small biopsy was performed on 06/10/2016. Recent change in his gait, was noted by his wife.  He been doing well until about 3-4 days ago became very ill with fever, loss of appetite and marked generalized weakness. He was sent to the emergency room via EMS. He was found to be febrile, confused and lethargic and blood cultures were positive for Streptococcus. An echocardiogram was obtained yesterday which reveals suggestion of endocarditis involving the mitral valve. I was asked to see the patient regarding the opinion and management.  This morning patient feels much better, is alert and oriented, states that except for mild weakness no other specific complaints. Denies any chest pain or shortness of breath. Denies any PND or orthopnea. States that he slept well.  Past Medical History:  Diagnosis Date  . Acute pulmonary embolus ([place]) 2010  . Arthritis   . Atrial fibrillation [facility])    s/p inpatient DCCV 09/11/2015  . Atrial flutter ([place])   . CHF (congestive heart failure) ([place])   . Coronary artery disease involving native coronary artery 05/15/2015   multivessel  . Dysrhythmia    A-fib  .  Essential hypertension   . Fibromyalgia   . History of kidney stones   . Hypercholesterolemia   . Hypertension   . Lupus anticoagulant positive 07/29/2008  . Mitral regurgitation 10/30/2015  . PMR (polymyalgia rheumatica) (HCC)   . Pneumonia   . 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 S/P Maze operation for atrial fibrillation 05/29/2015   Complete bilateral atrial lesion set via median sternotomy using bipolar radiofrequency and cryothermy ablation with clipping of LA appendage  . 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 APPENDECTOMY    . arm surgery  Right    wrist and elbow  . CARDIAC CATHETERIZATION N/A 05/15/2015   Procedure: Left Heart Cath and Coronary Angiography;  Surgeon: Peter M Martinique, MD;  Location: [place] CV LAB;  Service: Cardiovascular;  Laterality: N/A;  . CARDIAC CATHETERIZATION N/A 11/17/2015   Procedure: Right/Left Heart Cath and Coronary/Graft Angiography;  Surgeon: Adrian Prows, MD;  Location: [place] CV LAB;  Service: Cardiovascular;  Laterality: N/A;  . CARDIOVERSION N/A 09/11/2015   Procedure: CARDIOVERSION;  Surgeon: Lelon Perla, MD;  Location: [facility] ENDOSCOPY;  Service: Cardiovascular;  Laterality: N/A;  . CHEST TUBE  INSERTION  11/21/2015   Procedure: LEFT CHEST TUBE INSERTION;  Surgeon: Rexene Alberts, MD;  Location: [place];  Service: Open Heart Surgery;;  . CLIPPING OF ATRIAL APPENDAGE  05/29/2015   Procedure: CLIPPING OF LEFT ATRIAL APPENDAGE;  Surgeon: Rexene Alberts, MD;  Location: [place];  Service: Open Heart Surgery;;  45 AtriClip PRO 145  . CORONARY ARTERY BYPASS GRAFT N/A 05/29/2015   Procedure: CORONARY ARTERY  BYPASS GRAFTING (CABG), ON PUMP, TIMES THREE, USING LEFT INTERNAL MAMMARY ARTERY, BILATERAL GREATER SAPHENOUS VEINS HARVESTED ENDOSCOPICALLY;  Surgeon: Rexene Alberts, MD;  Location: [place];  Service: Open Heart Surgery;  Laterality: N/A;  . FRACTURE SURGERY     right wrist  . HERNIA REPAIR    . MAZE N/A 05/29/2015   Procedure: MAZE;  Surgeon: Rexene Alberts, MD;  Location: [place];  Service: Open Heart Surgery;  Laterality: N/A;  Complete Bi-Atrial Lesion set with Ablation and Cryothermy  . MITRAL VALVE REPAIR Right 11/21/2015   Procedure: MINIMALLY INVASIVE REOPERATION FOR MITRAL VALVE REPAIR;  Surgeon: Rexene Alberts, MD;  Location: [place];  Service: Open Heart Surgery;  Laterality: Right;  . PERIPHERAL VASCULAR CATHETERIZATION Bilateral 11/17/2015   Procedure: Renal Angiography;  Surgeon: Adrian Prows, MD;  Location: [place] CV LAB;  Service: Cardiovascular;  Laterality: Bilateral;  . PERIPHERAL VASCULAR CATHETERIZATION N/A 11/17/2015   Procedure: Abdominal Aortogram;  Surgeon: Adrian Prows, MD;  Location: [place] CV LAB;  Service: Cardiovascular;  Laterality: N/A;  . SHOULDER SURGERY Left    clavicular fracture  . TEE WITHOUT CARDIOVERSION N/A 05/29/2015   Procedure: TRANSESOPHAGEAL ECHOCARDIOGRAM (TEE);  Surgeon: Rexene Alberts, MD;  Location: [place];  Service: Open Heart Surgery;  Laterality: N/A;  . TEE WITHOUT CARDIOVERSION N/A 10/30/2015   Procedure: TRANSESOPHAGEAL ECHOCARDIOGRAM (TEE);  Surgeon: Adrian Prows, MD;  Location: [place];  Service: Cardiovascular;  Laterality: N/A;  . TEE WITHOUT CARDIOVERSION N/A 11/21/2015   Procedure: TRANSESOPHAGEAL ECHOCARDIOGRAM (TEE);  Surgeon: Rexene Alberts, MD;  Location: [place];  Service: Open Heart Surgery;  Laterality: N/A;     Family History  Problem Relation Age of Onset  . Cancer Mother   . Other Sister     chf  . Other Sister     Rosalee Kaufman     Social History: Social History   Social History  . Marital status: Married     Spouse name: N/A  . Number of children: N/A  . Years of education: N/A   Occupational History  . licensed Chief Financial Officer     self-employed   Social History Main Topics  . Smoking status: Never Smoker  . Smokeless tobacco: Never Used  . Alcohol use No  . Drug use: No  . Sexual activity: Not on file   Other Topics Concern  . Not on file   Social History Narrative   Lives with his wife (married 1990).  Former Health visitor. Body building/weight lifting, running for exercise.     Prescriptions Prior to Admission  Medication Sig Dispense Refill Last Dose  . acetaminophen (TYLENOL) 500 MG tablet Take 1,000 mg by mouth every 8 (eight) hours as needed for mild pain or moderate pain.   06/10/2016 at Unknown time  . amLODipine (NORVASC) 10 MG tablet Take 10 mg by mouth daily.   06/10/2016 at Unknown time  . Ascorbic Acid (VITAMIN C) 100 MG tablet Take 100 mg by mouth daily.   06/10/2016 at Unknown time  . metoprolol succinate (TOPROL-XL)  25 MG 24 hr tablet Take 0.5 tablets (12.5 mg total) by mouth daily. 90 tablet 1 06/10/2016 at 0800  . Multiple Vitamin (MULTIVITAMIN) tablet Take 1 tablet by mouth daily.   06/10/2016 at Unknown time  . predniSONE (DELTASONE) 5 MG tablet Take 5 mg by mouth daily.   06/10/2016 at Unknown time  . valsartan (DIOVAN) 160 MG tablet Take 1 tablet (160 mg total) by mouth daily.   06/10/2016 at Unknown time  . warfarin (COUMADIN) 7.5 MG tablet Take 1 tablet (7.5 mg total) by mouth daily at 6 PM. (Patient taking differently: Take 5-7.5 mg by mouth See admin instructions. Pt takes 5mg  by mouth once daily in the evening except for Wednesdays, 7.5mg  by mouth Wednesdays) 30 tablet 3 06/10/2016 at 1900  . zolpidem (AMBIEN) 10 MG tablet Take 10 mg by mouth daily.    Past Month at Unknown time     ROS: General: no fevers/chills/night sweats  Eyes: no blurry vision, diplopia, or amaurosis ENT: no sore throat or hearing loss Resp: no cough, wheezing, or hemoptysis CV:  no edema or palpitations GI: no abdominal pain, nausea, vomiting, diarrhea, or constipation GU: no dysuria, frequency, or hematuria Skin: no rash Neuro: no headache, numbness, tingling, or weakness of extremities, patient denies any other symptoms Musculoskeletal: no joint pain or swelling Heme: no bleeding, DVT, or easy bruising Endo: no polydipsia or polyuria    Physical Exam: Blood pressure (!) 141/86, pulse 93, temperature 97.7 F (36.5 C), temperature source Oral, resp. rate 19, height 5\' 10"  (1.778 m), weight 175 lb 11.3 oz (79.7 kg), SpO2 90 %.   General appearance: alert, cooperative, appears stated age and no distress Lungs: clear to auscultation bilaterally Heart: S1-S2 is normal. There is a mid diastolic murmur at the apex. Abdomen: soft, non-tender; bowel sounds normal; no masses,  no organomegaly Extremities: extremities normal, atraumatic, no cyanosis or edema Pulses: 2+ and symmetric Neurologic: Grossly normal  Labs:   Lab Results  Component Value Date   WBC 24.7 (H) 06/13/2016   HGB 9.7 (L) 06/13/2016   HCT 29.0 (L) 06/13/2016   MCV 91.8 06/13/2016   PLT 99 (L) 06/13/2016    Recent Labs Lab 06/13/16 0312  NA 136  K 4.5  CL 105  CO2 26  BUN 20  CREATININE 1.11  CALCIUM 8.5*  PROT 5.9*  BILITOT 0.4  ALKPHOS 43  ALT 24  AST 34  GLUCOSE 170*    Lipid Panel     Component Value Date/Time   CHOL 170 06/14/2016 0235   TRIG 133 06/14/2016 0235   HDL 24 (L) 06/14/2016 0235   CHOLHDL 7.1 06/14/2016 0235   VLDL 27 06/14/2016 0235   LDLCALC 119 (H) 06/14/2016 0235     Recent Labs  11/05/15 0418 11/06/15 0334 06/11/16 1035  BNP 867.3* 613.6* 259.0*    Recent Labs  09/19/15 1139 06/12/16 1118  TSH 1.98 1.071      Radiology: Mr Brain Wo Contrast  Result Date: 06/12/2016 CLINICAL DATA:  Acute encephalopathy. History of lupus and pulmonary embolism. Recent diagnosis of tongue cancer. EXAM: MRI HEAD WITHOUT CONTRAST TECHNIQUE: Multiplanar,  multiecho pulse sequences of the brain and surrounding structures were obtained without intravenous contrast. COMPARISON:  Head CT from yesterday FINDINGS: Brain: Scattered recent infarcts, including a moderate area of infarct in the lateral and anterior left temporal lobe. Smaller areas of infarction in the inferior left occipital cortex, right cerebellum, right posterior cerebral white matter, right caudate head, and bilateral parasagittal  parietal cortex. Some of the smaller infarcts have weaker diffusion restriction and may be subacute. The left temporal and occipital infarcts have mild petechial hemorrhage based on gradient imaging. There is also superficial T1 hyperintensity at these infarcts. Background chronic ischemic injury is overall mild. There has been a remote lacunar infarct in the right centrum semiovale. No hydrocephalus. History of tongue cancer. No overt mass. Intracranial screening would require contrast. Vascular: Preserved major flow voids. Skull and upper cervical spine: No marrow lesion noted. Sinuses/Orbits: Mastoid fluid on the right with negative nasopharynx. 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 Moderate area of infarction in the lateral left temporal lobe. Mild petechial hemorrhage. Electronically Signed   By: Monte Fantasia M.D.   On: 06/12/2016 16:41    Scheduled Meds: . acyclovir  10 mg/kg Intravenous Q8H  . aspirin  300 mg Rectal Daily   Or  . aspirin  325 mg Oral Daily  . cefTRIAXone (ROCEPHIN)  IV  2 g Intravenous Q12H  . dexamethasone  10 mg Intravenous Q6H  . QUEtiapine  25 mg Oral QHS  . sodium chloride flush  3 mL Intravenous Q12H   Continuous Infusions: . sodium chloride 125 mL/hr at 06/14/16 0205   PRN Meds:.acetaminophen **OR** acetaminophen, haloperidol lactate, ondansetron **OR** ondansetron (ZOFRAN) IV, ondansetron (ZOFRAN) IV, oxyCODONE   Cardiac studies:   EKG 06/11/2016: Normal  sinus rhythm/sinus tachycardia at the rate of 100 bpm, normal axis. No evidence of ischemia, normal EKG. PVC.  Echo: 06/13/2016: Normal LV systolic function. Mitral valve, especially the anterior mitral leaflet is severely thickened and appears myxomatous with suspicion for vegetation. Posterior mitral leaflet is restricted in movement. There is moderate to severe mitral stenosis with a mean gradient of 22 mmHg, mitral valve area of 1.15 cm. Left atrium was moderate to severely dilated, moderate pulmonary hypertension, PA pressure 45 mmHg.  Scheduled Meds: . aspirin  300 mg Rectal Daily   Or  . aspirin  325 mg Oral Daily  . cefTRIAXone (ROCEPHIN)  IV  2 g Intravenous Q12H  . dexamethasone  10 mg Intravenous Q6H  . QUEtiapine  25 mg Oral QHS  . sodium chloride flush  3 mL Intravenous Q12H   Continuous Infusions: . sodium chloride 125 mL/hr at 06/14/16 0205   PRN Meds:.acetaminophen **OR** acetaminophen, haloperidol lactate, ondansetron **OR** ondansetron (ZOFRAN) IV, ondansetron (ZOFRAN) IV, oxyCODONE   ASSESSMENT AND PLAN:  1. 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 2. History of minimally invasive mitral valve repair on 11/21/2015 3. Atherosclerosis of native coronary artery of native heart with angina pectoris S/P CABG x 3 on 05/29/2015 with LIMA to LAD, SVG to OM2, SVG to RCA; and Maze procedure and clipping of left atrial appendage. 4. Paroxysmal atrial fibrillation and atrial flutter, S/P Maze procedure and left atrial appendage closure during CABG, presently on Xarelto. Continue the same. CHA2DS2-VASCScore: Risk Score 4.0 Yearly risk of stroke 4%  5. Hypercoagulable state, primary. On long-term anticoagulation with Coumadin. 6. Hypertension 7. Hyperlipidemia 8. 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  Recommendation: Patient is alert and oriented 3 today, would want his condom catheter out. I will let the primary team decide on further evaluation of sepsis however I strongly feel that endocarditis occurred after he had manually pulled  out a small cancellous material from his oral cavity 10 days ago. As long as the antibiotic is able to penetrate CSF, it should be safe for [place] to presume that he should get well and the valve should be able to be preserved. He will need repeat echocardiogram in 4 weeks. I will continue to follow the patient.  Adrian Prows, MD 06/14/2016, 8:11 AM [facility] [place] Pager: [phone] Office: [phone] If no answer Cell [phone]

## 2016-06-14 NOTE — Progress Notes (Signed)
Nutrition Brief Note  Patient identified on the Malnutrition Screening Tool (MST) Report.  Wt Readings from Last 15 Encounters:  06/14/16 180 lb 12.4 oz (82 kg)  02/16/16 180 lb (81.6 kg)  12/15/15 175 lb (79.4 kg)  12/01/15 178 lb (80.7 kg)  11/19/15 183 lb (83 kg)  11/19/15 185 lb 4.8 oz (84.1 kg)  11/17/15 180 lb (81.6 kg)  11/06/15 181 lb 12.8 oz (82.5 kg)  10/30/15 188 lb (85.3 kg)  10/15/15 188 lb (85.3 kg)  10/14/15 186 lb 2 oz (84.4 kg)  09/28/15 173 lb 9.6 oz (78.7 kg)  09/19/15 185 lb 3.2 oz (84 kg)  09/12/15 173 lb 9.6 oz (78.7 kg)  08/25/15 188 lb (85.3 kg)    Body mass index is 25.94 kg/m. Patient meets criteria for Overweight based on current BMI.   Current diet order is Regular, patient's average intake is approximately 70% of meals at this time. Labs and medications reviewed.   No nutrition interventions warranted at this time. If nutrition issues arise, please consult RD.   Arthur Holms, RD, LDN Pager #: [phone] After-Hours Pager #: [phone]

## 2016-06-14 NOTE — Progress Notes (Signed)
ANTICOAGULATION CONSULT NOTE - Initial Consult  Pharmacy Consult for Xarelto Indication: hx of PE and atrial fibrillation   Allergies  Allergen Reactions  . Amitriptyline Shortness Of Breath  . Serotonin Reuptake Inhibitors (Ssris) Nausea And Vomiting    SSRI uptake makes him extremely sick.    Patient Measurements: Height: 5\' 10"  (177.8 cm) Weight: 180 lb 12.4 oz (82 kg) IBW/kg (Calculated) : 73   Vital Signs: Temp: 97.5 F (36.4 C) (02/05 1215) Temp Source: Oral (02/05 1215) BP: 127/77 (02/05 1215) Pulse Rate: 82 (02/05 1215)  Labs:  Recent Labs  06/11/16 1933 06/12/16 1118 06/13/16 0312 06/14/16 0237  HGB 10.9* 10.0* 9.7*  --   HCT 32.0* 30.3* 29.0*  --   PLT  --  91* 99*  --   LABPROT  --  23.3* 16.9* 16.3*  INR  --  2.04 1.37 1.30  CREATININE 1.20 1.14 1.11  --     Estimated Creatinine Clearance: 66.7 mL/min (by C-G formula based on SCr of 1.11 mg/dL).   Medical History: Past Medical History:  Diagnosis Date  . Acute CHF (congestive heart failure) ([place]) 11/01/2015  . Acute on chronic diastolic heart failure ([place]) 09/27/2015  . Acute pulmonary embolus ([place]) 2010  . Acute respiratory failure with hypoxia ([place]) 09/09/2015  . Arthritis   . Atrial fibrillation [facility])    s/p inpatient DCCV 09/11/2015  . Atrial flutter ([place])   . CHF (congestive heart failure) ([place])   . Coronary artery disease involving native coronary artery 05/15/2015   multivessel  . Dysrhythmia    A-fib  . Essential hypertension   . Fibromyalgia   . History of kidney stones   . Hypercholesterolemia   . Hypertension   . Lupus anticoagulant positive 07/29/2008  . Mitral regurgitation 10/30/2015  . PMR (polymyalgia rheumatica) ([facility])   . Pneumonia   . 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 S/P Maze operation for atrial fibrillation 05/29/2015   Complete bilateral atrial lesion set via median sternotomy using bipolar  radiofrequency and cryothermy ablation with clipping of LA appendage  . 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 Per neurology to transition to xarelto, no acute plans for LP. Hgb stable with chronic low PLTc.   Goal of Therapy:  Monitor platelets by anticoagulation protocol: Yes   Plan:  1. Begin Xarelto 20 mg daily  2. CBC every 72 hours 3. Educate   Vincenza Hews, PharmD, BCPS 06/14/2016, 12:39 PM

## 2016-06-15 ENCOUNTER — Inpatient Hospital Stay (HOSPITAL_COMMUNITY): Payer: Medicare Other

## 2016-06-15 DIAGNOSIS — A408 Other streptococcal sepsis: Principal | ICD-10-CM

## 2016-06-15 DIAGNOSIS — I5032 Chronic diastolic (congestive) heart failure: Secondary | ICD-10-CM

## 2016-06-15 DIAGNOSIS — R451 Restlessness and agitation: Secondary | ICD-10-CM

## 2016-06-15 DIAGNOSIS — D62 Acute posthemorrhagic anemia: Secondary | ICD-10-CM

## 2016-06-15 DIAGNOSIS — I63423 Cerebral infarction due to embolism of bilateral anterior cerebral arteries: Secondary | ICD-10-CM

## 2016-06-15 DIAGNOSIS — I63413 Cerebral infarction due to embolism of bilateral middle cerebral arteries: Secondary | ICD-10-CM

## 2016-06-15 DIAGNOSIS — I25810 Atherosclerosis of coronary artery bypass graft(s) without angina pectoris: Secondary | ICD-10-CM

## 2016-06-15 DIAGNOSIS — I10 Essential (primary) hypertension: Secondary | ICD-10-CM

## 2016-06-15 DIAGNOSIS — M353 Polymyalgia rheumatica: Secondary | ICD-10-CM

## 2016-06-15 DIAGNOSIS — D696 Thrombocytopenia, unspecified: Secondary | ICD-10-CM

## 2016-06-15 DIAGNOSIS — L930 Discoid lupus erythematosus: Secondary | ICD-10-CM

## 2016-06-15 DIAGNOSIS — T826XXS Infection and inflammatory reaction due to cardiac valve prosthesis, sequela: Secondary | ICD-10-CM

## 2016-06-15 DIAGNOSIS — D638 Anemia in other chronic diseases classified elsewhere: Secondary | ICD-10-CM

## 2016-06-15 DIAGNOSIS — Z86711 Personal history of pulmonary embolism: Secondary | ICD-10-CM

## 2016-06-15 DIAGNOSIS — R0682 Tachypnea, not elsewhere classified: Secondary | ICD-10-CM

## 2016-06-15 DIAGNOSIS — I639 Cerebral infarction, unspecified: Secondary | ICD-10-CM

## 2016-06-15 DIAGNOSIS — D649 Anemia, unspecified: Secondary | ICD-10-CM

## 2016-06-15 DIAGNOSIS — D72829 Elevated white blood cell count, unspecified: Secondary | ICD-10-CM

## 2016-06-15 DIAGNOSIS — C029 Malignant neoplasm of tongue, unspecified: Secondary | ICD-10-CM

## 2016-06-15 LAB — VAS US CAROTID
LCCADSYS: -114 cm/s
LCCAPDIAS: 17 cm/s
LEFT ECA DIAS: -12 cm/s
LEFT VERTEBRAL DIAS: -7 cm/s
Left CCA dist dias: -25 cm/s
Left CCA prox sys: 100 cm/s
Left ICA dist dias: -14 cm/s
Left ICA dist sys: -46 cm/s
Left ICA prox dias: -14 cm/s
Left ICA prox sys: -48 cm/s
RCCADSYS: -60 cm/s
RCCAPDIAS: 16 cm/s
RCCAPSYS: 85 cm/s
RIGHT ECA DIAS: -14 cm/s
RIGHT VERTEBRAL DIAS: -12 cm/s

## 2016-06-15 LAB — CBC
HCT: 28.1 % — ABNORMAL LOW (ref 39.0–52.0)
Hemoglobin: 9.4 g/dL — ABNORMAL LOW (ref 13.0–17.0)
MCH: 31 pg (ref 26.0–34.0)
MCHC: 33.5 g/dL (ref 30.0–36.0)
MCV: 92.7 fL (ref 78.0–100.0)
PLATELETS: 106 10*3/uL — AB (ref 150–400)
RBC: 3.03 MIL/uL — AB (ref 4.22–5.81)
RDW: 14.1 % (ref 11.5–15.5)
WBC: 18.8 10*3/uL — AB (ref 4.0–10.5)

## 2016-06-15 LAB — BASIC METABOLIC PANEL
ANION GAP: 10 (ref 5–15)
BUN: 18 mg/dL (ref 6–20)
CALCIUM: 8.7 mg/dL — AB (ref 8.9–10.3)
CO2: 27 mmol/L (ref 22–32)
Chloride: 103 mmol/L (ref 101–111)
Creatinine, Ser: 1.02 mg/dL (ref 0.61–1.24)
Glucose, Bld: 118 mg/dL — ABNORMAL HIGH (ref 65–99)
POTASSIUM: 3.8 mmol/L (ref 3.5–5.1)
Sodium: 140 mmol/L (ref 135–145)

## 2016-06-15 LAB — HIV ANTIBODY (ROUTINE TESTING W REFLEX): HIV Screen 4th Generation wRfx: NONREACTIVE

## 2016-06-15 LAB — PROTIME-INR
INR: 2.12
Prothrombin Time: 24.1 s — ABNORMAL HIGH (ref 11.4–15.2)

## 2016-06-15 LAB — HEMOGLOBIN A1C
Hgb A1c MFr Bld: 5.7 % — ABNORMAL HIGH (ref 4.8–5.6)
MEAN PLASMA GLUCOSE: 117 mg/dL

## 2016-06-15 MED ORDER — PSYLLIUM 95 % PO PACK
1.0000 | PACK | Freq: Every day | ORAL | Status: DC
  Administered 2016-06-16 – 2016-06-17 (×2): 1 via ORAL
  Filled 2016-06-15 (×3): qty 1

## 2016-06-15 MED ORDER — ATORVASTATIN CALCIUM 40 MG PO TABS
40.0000 mg | ORAL_TABLET | Freq: Every day | ORAL | Status: DC
Start: 2016-06-15 — End: 2016-06-17
  Administered 2016-06-15 – 2016-06-17 (×3): 40 mg via ORAL
  Filled 2016-06-15 (×3): qty 1

## 2016-06-15 MED ORDER — OXYCODONE-ACETAMINOPHEN 7.5-325 MG PO TABS
1.0000 | ORAL_TABLET | ORAL | Status: DC | PRN
  Administered 2016-06-15 – 2016-06-17 (×2): 1 via ORAL
  Filled 2016-06-15 (×2): qty 1

## 2016-06-15 NOTE — Progress Notes (Signed)
STROKE TEAM PROGRESS NOTE   HISTORY OF PRESENT ILLNESS (per record) Peter Bruce is an 66 y.o. male with a past medical history significant for lupus AC and hx of PE on warfarin, PMR on pred 5, Atrial fibrillation, CAD, HTN, and recent diagnosis of tongue CAwho presents with fever and intermittent confusion and leg weakness. Patient was found to be septic while in the hospital. 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 Patient is very encephalopathic. His last know well in unable to be determined. Patient was not administered IV t-PA secondary to no LSN and likely bacterial etiology.   Albumin 2.5, WBC 24.7, INR 1.37, PLTS 99   SUBJECTIVE (INTERVAL HISTORY) His  Speech therapist is   at the bedside.  Pt remains confused but improving.   OBJECTIVE Temp:  [97.6 F (36.4 C)-98.5 F (36.9 C)] 97.8 F (36.6 C) (02/06 1209) Pulse Rate:  [73-98] 89 (02/06 1209) Cardiac Rhythm: Heart block;Normal sinus rhythm (02/06 0743) Resp:  [17-23] 19 (02/06 1209) BP: (135-169)/(79-110) 147/96 (02/06 1209) SpO2:  [94 %-100 %] 95 % (02/06 0731)  CBC:   Recent Labs Lab 06/12/16 1118 06/13/16 0312 06/15/16 0310  WBC 16.2* 24.7* 18.8*  NEUTROABS 15.5* 23.6*  --   HGB 10.0* 9.7* 9.4*  HCT 30.3* 29.0* 28.1*  MCV 92.7 91.8 92.7  PLT 91* 99* 106*    Basic Metabolic Panel:   Recent Labs Lab 06/13/16 0312 06/15/16 0310  NA 136 140  K 4.5 3.8  CL 105 103  CO2 26 27  GLUCOSE 170* 118*  BUN 20 18  CREATININE 1.11 1.02  CALCIUM 8.5* 8.7*  MG 2.2  --     Lipid Panel:     Component Value Date/Time   CHOL 170 06/14/2016 0235   TRIG 133 06/14/2016 0235   HDL 24 (L) 06/14/2016 0235   CHOLHDL 7.1 06/14/2016 0235   VLDL 27 06/14/2016 0235   LDLCALC 119 (H) 06/14/2016 0235   HgbA1c:  Lab Results  Component Value Date   HGBA1C 5.7 (H) 06/14/2016   Urine Drug Screen: No results found for: LABOPIA, COCAINSCRNUR, LABBENZ, AMPHETMU,  THCU, LABBARB    IMAGING  Mr Brain Wo Contrast 06/12/2016 Multiple acute and subacute infarcts as described; the pattern favors central embolic disease. Moderate area of infarction in the lateral left temporal lobe. Mild petechial hemorrhage.   Carotid Doppler   There is 1-39% bilateral ICA stenosis. Vertebral artery flow is antegrade.    2D Echocardiogram  - Left ventricle: The cavity size was normal. Systolic function was  normal. The estimated ejection fraction was in the range of 55% to 60%. The study is not technically sufficient to allow evaluation of LV diastolic function. - Ventricular septum: Septal motion showed paradox. These changes are consistent with a post-thoracotomy state. - Aortic valve: There was mild regurgitation. - Mitral valve: Prior procedures included surgical repair. 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 Severe thickening of the anterior leaflet. There was a large, fixed most probably a vegetation on the left ventricular aspect of the anterior leaflet; the appearance is consistent with vegetation. Transvalvular velocity was abnormal, due to stenosis. The findings are consistent with moderate to severe stenosis. Mean gradient (D): 22 mm Hg. Valve area by pressure half-time: 2.29 cm^2. Indexed valve area by pressure half-time: 1.15 cm^2/m^2. Valve area by continuity equation (using LVOT flow): 0.72 cm^2. - Left atrium: The atrium was moderately to severely dilated. - Right atrium: The atrium  was mildly dilated. - Atrial septum: No defect or patent foramen ovale was identified. - Pulmonary arteries: PA peak pressure: 45 mm Hg (S). Impressions:  The right ventricular systolic pressure was increased consistent with moderate pulmonary hypertension.   PHYSICAL EXAM Pleasant middle-age Caucasian male currently not in distress. . Afebrile. Head is nontraumatic. Neck is supple without bruit.    Cardiac exam no murmur or gallop. Lungs are clear to  auscultation. Distal pulses are well felt. Neurological Exam :  Awake alert confused. Oriented to place and person only. Follows simple midline and one-step commands only. No aphasia or apraxia dysarthria. Extraocular moments are full range without nystagmus. Blinks to threat bilaterally. No facial weakness. Tongue midline. Motor system exam reveals no upper or lower extremity drift but not cooperative for detailed exam. No focal weakness noted. Deep tendon flexes are symmetric. Plantars are downgoing. Gait was not tested.  ASSESSMENT/PLAN [name] is a 66 y.o. male with history of lupus AC and hx of PE on warfarin, PMR on pred 5, Atrial fibrillation, CAD, HTN, and recent diagnosis of tongue CA presenting with fever and intermittent confusion and leg weakness. He did not receive IV t-PA due to unknown LKW and likely bacterial presentation.   Stroke:  Multiple embolic infarcts, multiple etiologies including known atrial fibrillation, endocarditis. Doubt relationship with recently diagnosed oral cancer  MRI  Multiple acute and subacute infarcts    Carotid Doppler  No significant stenosis   2D Echo  LV vegetation  LDL 119  HgbA1c 5.6  SCDs for VTE prophylaxis Diet regular Room service appropriate? Yes with Assist; Fluid consistency: Thin  warfarin daily prior to admission, now on No antithrombotic given plans for LP . Ok to start now per [place]. Will do xarelto. Order written for pharmacy to dose  Patient counseled to be compliant with his antithrombotic medications  Ongoing aggressive stroke risk factor management  Therapy recommendations:  SNF  Disposition:  pending   Native Valve Endocarditis w/ strep viridans  hx minimally invasive valve repair on 11/21/2015  Recently had oral bx fo which pt removed cancellous material 10 days ago  On abx, ID following  ganji feels valve will be well preserved  Atrial Fibrillation  Home anticoagulation:  warfarin daily    s/p MAZE 05/2015  INR 1.98 on admission  On no anticoagulation at this time ok to start xarelto. Order written  Recommend Continuation anticoagulation at discharge    Hypertension  Stable  Permissive hypertension (OK if < 220/120) but gradually normalize in 5-7 days  Long-term BP goal normotensive  Hyperlipidemia  Home meds:  crestor 20  Resume crestor when able  LDL 119, goal < 70  Continue statin at discharge  Other Stroke Risk Factors  Advanced age  Overweight, Body mass index is 25.94 kg/m., recommend weight loss, diet and exercise as appropriate   Coronary artery disease s/p CABG 05/2015 and MAZE  CHF  Lupus anticoagulant  Hx PE on warfarin  Other Active Problems  PMR on prednisone  Recent diagnosis of tongue cancer   Agree low suspicion for herpes.  Hospital day # 4    I have personally examined this patient, reviewed notes, independently viewed imaging studies, participated in medical decision making and plan of care.ROS completed by me personally and pertinent positives fully documented  I have made any additions or clarifications directly to the above note.  . Patient has presented with confusion secondary to small bi-cerebral embolic infarcts in etiology possibly related to  endocarditis versus atrial fibrillation. Doubt relationship with colon cancer. Discussed case with cardiologist Dr. Einar Gip and Dr. Posey Pronto. Agree with   Xarelto for anticoagulation. No need for spinal tap has clinical suspicion for meningitis is low. Greater than 50% time during this 25 minute visit was spent on counseling and coordination of care about his confusion, embolic strokes and answering questions.Stroke team will sign off. Call for questions.  Antony Contras, MD Medical Director [place] Pager: [phone] 06/15/2016 12:44 PM  To contact Stroke Continuity provider, please refer to [email or web address] After hours, contact General Neurology

## 2016-06-15 NOTE — Plan of Care (Signed)
Problem: Education: Goal: Knowledge of disease or condition will improve Outcome: Progressing Pt is still confused, but mental status is improving and is more receptive to education.

## 2016-06-15 NOTE — Progress Notes (Signed)
SLP Cancellation Note  Patient Details Name: Peter Bruce MRN: [id number] DOB: 1951-01-24   Cancelled treatment:       Reason Eval/Treat Not Completed: Other (comment). Attempted MBS, pt refused to participate. Insists he is going to leave, he has work to do, cannot do MBS. Could not reason with him due to cognition. Will f/u.    Ciarra Braddy, Katherene Ponto 06/15/2016, 12:07 PM

## 2016-06-15 NOTE — Progress Notes (Signed)
CSW spoke with pt wife at bedside concerning PT recommendation for SNF.  Wife states that she does not want patient to go to SNF and thinks his mental status would be worse in a facility- states she would take patient home or have him go to CIR for rehab if that is needed when his medical issues clear.  No further needs at this time- CSW signing off  Jorge Ny, [place] Social Worker [phone]

## 2016-06-15 NOTE — Progress Notes (Addendum)
Speech pathologist found patient going through an overnight/attache case in which she found him with a home prescription of Diazepam. She then gave it to me; she did not state she witnessed the patient ingesting any. Patient has two patient belonging bags, a 'duffel/carryon bag', and toiletries case.  No other medications were in either bag, per patient.  I spoke with the patients spouse, Beverlee Nims, and she is coming to get the medication(s) to take home.  She is expected around noon today.

## 2016-06-15 NOTE — Progress Notes (Signed)
Rehab Admissions Coordinator Note:  Patient was screened by Cleatrice Burke for appropriateness for an Inpatient Acute Rehab Consult per OT recommendation.   At this time, we are recommending Inpatient Rehab consult.  Cleatrice Burke 06/15/2016, 10:01 AM  I can be reached at [phone].

## 2016-06-15 NOTE — Consult Note (Signed)
Physical Medicine and Rehabilitation Consult Reason for Consult: Multiple punctate infarcts Referring Physician: [facility]   HPI: Peter Bruce is a 66 y.o. right handed male with history of lupus/polymyalgia rheumatica, acute on chronic diastolic congestive heart failure, pulmonary emboli on chronic Coumadin, atrial fibrillation, CAD status post CABG as well as minimally invasive valve repair 11/21/2015, hypertension,  as well as recent diagnosis of tongue cancer. Per chart review, patient lives with spouse had been independent prior to admission. Wife in and out during the day, operating a [employer]. One level home 3 steps to entry. Presented 06/11/2016 with altered mental status and lower extremity weakness and history of fall. Patient had developed some low back pain and leg weakness a few weeks ago. He did see his chiropractor and this resolved. Noted fever 102, WBC 21,300, lactic acid 2.08. INR on admission 1.98. Chest x-ray showed mild CHF but no focal pneumonia. Cranial CT scan negative for acute changes. MRI reviewed, showing multiple acute and subacute infarcts, infarct in the lateral left temporal lobe. Patient did not receive TPA. Carotid Dopplers with no ICA stenosis. Echocardiogram with ejection fraction of 60% as well as mitral valve vegetation. Patient placed on intravenous ceftriaxone. Infectious disease consulted for any further input regarding antibiotics. Await plan for possible lumbar puncture as well as TEE. Currently Coumadin has been transitioned to Xarelto. Physical and occupational therapy evaluations completed with ongoing bouts of confusion and limited awareness.. M.D. has requested physical medicine rehabilitation consult.   Review of Systems  Constitutional: Negative for chills and fever.  Cardiovascular: Negative for chest pain and palpitations.  Musculoskeletal: Negative for joint pain, myalgias and neck pain.  Neurological: Negative for sensory change and  focal weakness.  All other systems reviewed and are negative. ?Reliability  Past Medical History:  Diagnosis Date  . Acute CHF (congestive heart failure) ([place]) 11/01/2015  . Acute on chronic diastolic heart failure ([place]) 09/27/2015  . Acute pulmonary embolus ([place]) 2010  . Acute respiratory failure with hypoxia ([place]) 09/09/2015  . Arthritis   . Atrial fibrillation [facility])    s/p inpatient DCCV 09/11/2015  . Atrial flutter ([place])   . CHF (congestive heart failure) ([place])   . Coronary artery disease involving native coronary artery 05/15/2015   multivessel  . Dysrhythmia    A-fib  . Essential hypertension   . Fibromyalgia   . History of kidney stones   . Hypercholesterolemia   . Hypertension   . Lupus anticoagulant positive 07/29/2008  . Mitral regurgitation 10/30/2015  . PMR (polymyalgia rheumatica) ([facility])   . Pneumonia   . 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 S/P Maze operation for atrial fibrillation 05/29/2015   Complete bilateral atrial lesion set via median sternotomy using bipolar radiofrequency and cryothermy ablation with clipping of LA appendage  . 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 APPENDECTOMY    . arm surgery  Right    wrist and elbow  . CARDIAC CATHETERIZATION N/A 05/15/2015   Procedure: Left Heart Cath and Coronary Angiography;  Surgeon: Peter M Martinique, MD;  Location: [place] CV LAB;  Service: Cardiovascular;  Laterality:  N/A;  . CARDIAC CATHETERIZATION N/A 11/17/2015   Procedure: Right/Left Heart Cath and Coronary/Graft Angiography;  Surgeon: Adrian Prows, MD;  Location: [place] CV LAB;  Service: Cardiovascular;  Laterality: N/A;    . CARDIOVERSION N/A 09/11/2015   Procedure: CARDIOVERSION;  Surgeon: Lelon Perla, MD;  Location: [facility] ENDOSCOPY;  Service: Cardiovascular;  Laterality: N/A;  . CHEST TUBE INSERTION  11/21/2015   Procedure: LEFT CHEST TUBE INSERTION;  Surgeon: Rexene Alberts, MD;  Location: [place];  Service: Open Heart Surgery;;  . CLIPPING OF ATRIAL APPENDAGE  05/29/2015   Procedure: CLIPPING OF LEFT ATRIAL APPENDAGE;  Surgeon: Rexene Alberts, MD;  Location: [place];  Service: Open Heart Surgery;;  45 AtriClip PRO 145  . CORONARY ARTERY BYPASS GRAFT N/A 05/29/2015   Procedure: CORONARY ARTERY BYPASS GRAFTING (CABG), ON PUMP, TIMES THREE, USING LEFT INTERNAL MAMMARY ARTERY, BILATERAL GREATER SAPHENOUS VEINS HARVESTED ENDOSCOPICALLY;  Surgeon: Rexene Alberts, MD;  Location: [place];  Service: Open Heart Surgery;  Laterality: N/A;  . FRACTURE SURGERY     right wrist  . HERNIA REPAIR    . MAZE N/A 05/29/2015   Procedure: MAZE;  Surgeon: Rexene Alberts, MD;  Location: [place];  Service: Open Heart Surgery;  Laterality: N/A;  Complete Bi-Atrial Lesion set with Ablation and Cryothermy  . MITRAL VALVE REPAIR Right 11/21/2015   Procedure: MINIMALLY INVASIVE REOPERATION FOR MITRAL VALVE REPAIR;  Surgeon: Rexene Alberts, MD;  Location: [place];  Service: Open Heart Surgery;  Laterality: Right;  . PERIPHERAL VASCULAR CATHETERIZATION Bilateral 11/17/2015   Procedure: Renal Angiography;  Surgeon: Adrian Prows, MD;  Location: [place] CV LAB;  Service: Cardiovascular;  Laterality: Bilateral;  . PERIPHERAL VASCULAR CATHETERIZATION N/A 11/17/2015   Procedure: Abdominal Aortogram;  Surgeon: Adrian Prows, MD;  Location: [place] CV LAB;  Service: Cardiovascular;  Laterality: N/A;  . SHOULDER SURGERY Left    clavicular fracture  . TEE WITHOUT CARDIOVERSION N/A 05/29/2015   Procedure: TRANSESOPHAGEAL ECHOCARDIOGRAM (TEE);  Surgeon: Rexene Alberts, MD;  Location: [place];  Service: Open Heart Surgery;  Laterality: N/A;  . TEE WITHOUT  CARDIOVERSION N/A 10/30/2015   Procedure: TRANSESOPHAGEAL ECHOCARDIOGRAM (TEE);  Surgeon: Adrian Prows, MD;  Location: [place];  Service: Cardiovascular;  Laterality: N/A;  . TEE WITHOUT CARDIOVERSION N/A 11/21/2015   Procedure: TRANSESOPHAGEAL ECHOCARDIOGRAM (TEE);  Surgeon: Rexene Alberts, MD;  Location: [place];  Service: Open Heart Surgery;  Laterality: N/A;   Family History  Problem Relation Age of Onset  . Cancer Mother   . Other Sister     chf  . Other Sister     Rosalee Kaufman   Social History:  reports that he has never smoked. He has never used smokeless tobacco. He reports that he does not drink alcohol or use drugs. Allergies:  Allergies  Allergen Reactions  . Amitriptyline Shortness Of Breath  . Serotonin Reuptake Inhibitors (Ssris) Nausea And Vomiting    SSRI uptake makes him extremely sick.   Medications Prior to Admission  Medication Sig Dispense Refill  . acetaminophen (TYLENOL) 500 MG tablet Take 1,000 mg by mouth every 8 (eight) hours as needed for mild pain or moderate pain.    Marland Kitchen amLODipine (NORVASC) 10 MG tablet Take 10 mg by mouth daily.    . Ascorbic Acid (VITAMIN C) 100 MG tablet Take 100 mg by mouth daily.    . metoprolol succinate (TOPROL-XL) 25 MG 24 hr tablet Take 0.5 tablets (12.5 mg total) by mouth daily.  90 tablet 1  . Multiple Vitamin (MULTIVITAMIN) tablet Take 1 tablet by mouth daily.    . predniSONE (DELTASONE) 5 MG tablet Take 5 mg by mouth daily.    . valsartan (DIOVAN) 160 MG tablet Take 1 tablet (160 mg total) by mouth daily.    Marland Kitchen warfarin (COUMADIN) 7.5 MG tablet Take 1 tablet (7.5 mg total) by mouth daily at 6 PM. (Patient taking differently: Take 5-7.5 mg by mouth See admin instructions. Pt takes 5mg  by mouth once daily in the evening except for Wednesdays, 7.5mg  by mouth Wednesdays) 30 tablet 3  . zolpidem (AMBIEN) 10 MG tablet Take 10 mg by mouth daily.       Home: Home Living Family/patient expects to be discharged to:: Private  residence Living Arrangements: Spouse/significant other Available Help at Discharge: Family, Available PRN/intermittently Type of Home: House Home Access: Stairs to enter [employer] of Steps: 3 Entrance Stairs-Rails: Right, Left Home Layout: One level Home Equipment: [place] - 2 wheels, Cane - single point  Functional History: Prior Function Level of Independence: Independent Comments: Very active.  Drives Functional Status:  Mobility: Bed Mobility Overal bed mobility: Needs Assistance Bed Mobility: Supine to Sit, Sit to Supine Rolling: Min guard Sidelying to sit: Min assist Supine to sit: Min guard Sit to sidelying: Mod assist General bed mobility comments: assist to lift trunk  Transfers Overall transfer level: Needs assistance Equipment used: 1 person hand held assist Transfers: Sit to/from Stand, Stand Pivot Transfers Sit to Stand: Min assist Stand pivot transfers: Min assist General transfer comment: min A for balance  Ambulation/Gait General Gait Details: NT    ADL: ADL Overall ADL's : Needs assistance/impaired Eating/Feeding: Supervision/ safety, Sitting, Set up Grooming: Wash/dry hands, Wash/dry face, Oral care, Brushing hair, Supervision/safety, Sitting, Set up Upper Body Bathing: Minimal assistance, Sitting Lower Body Bathing: Moderate assistance, Sit to/from stand Upper Body Dressing : Minimal assistance, Sitting Lower Body Dressing: Moderate assistance, Sit to/from stand Toilet Transfer: Moderate assistance, Ambulation, Comfort height toilet, BSC, Grab bars, RW Toileting- Clothing Manipulation and Hygiene: Moderate assistance, Sit to/from stand Functional mobility during ADLs: Minimal assistance, Moderate assistance, Rolling walker  Cognition: Cognition Overall Cognitive Status: Impaired/Different from baseline Arousal/Alertness: Awake/alert Orientation Level: Oriented to person, Disoriented to place, Disoriented to time, Disoriented to  situation Cognition Arousal/Alertness: Awake/alert Behavior During Therapy: Flat affect Overall Cognitive Status: Impaired/Different from baseline Area of Impairment: Attention, Memory, Safety/judgement, Following commands, Problem solving Orientation Level: Disoriented to, Place, Situation Current Attention Level: Selective Memory: Decreased short-term memory Following Commands: Follows one step commands consistently, Follows multi-step commands inconsistently Safety/Judgement: Decreased awareness of safety, Decreased awareness of deficits Problem Solving: Slow processing, Decreased initiation, Difficulty sequencing, Requires verbal cues, Requires tactile cues  Blood pressure (!) 156/79, pulse 98, temperature 97.9 F (36.6 C), temperature source Oral, resp. rate (!) 23, height 5\' 10"  (1.778 m), weight 82 kg (180 lb 12.4 oz), SpO2 95 %. Physical Exam  Vitals reviewed. Constitutional: He appears well-developed and well-nourished.  Aggravated by questions and exam stating he has done this multiple times  HENT:  Head: Normocephalic and atraumatic.  Eyes: Conjunctivae and EOM are normal.  Neck: Normal range of motion. Neck supple. No thyromegaly present.  Cardiovascular:  Irregularly irregular  Respiratory: Effort normal and breath sounds normal.  GI: Soft. Bowel sounds are normal. He exhibits no distension.  Musculoskeletal: He exhibits no edema or tenderness.  Neurological: He is alert.  Patient limited awareness of deficits.  A&Ox3 with cues and prompts He did follow  simple commands Motor: Grossly 4/5 throughout (pt not fully participating) Dysarthria DTRs symmetric Sensation intact to light touch  Skin: Skin is warm and dry.  Psychiatric: His affect is blunt. His speech is delayed and slurred. He is slowed. Cognition and memory are impaired.    Results for orders placed or performed during the hospital encounter of 06/11/16 (from the past 24 hour(s))  Protime-INR     Status:  Abnormal   Collection Time: 06/15/16  3:10 AM  Result Value Ref Range   Prothrombin Time 24.1 (H) 11.4 - 15.2 seconds   INR 2.12   CBC     Status: Abnormal   Collection Time: 06/15/16  3:10 AM  Result Value Ref Range   WBC 18.8 (H) 4.0 - 10.5 K/uL   RBC 3.03 (L) 4.22 - 5.81 MIL/uL   Hemoglobin 9.4 (L) 13.0 - 17.0 g/dL   HCT 28.1 (L) 39.0 - 52.0 %   MCV 92.7 78.0 - 100.0 fL   MCH 31.0 26.0 - 34.0 pg   MCHC 33.5 30.0 - 36.0 g/dL   RDW 14.1 11.5 - 15.5 %   Platelets 106 (L) 150 - 400 K/uL  Basic metabolic panel     Status: Abnormal   Collection Time: 06/15/16  3:10 AM  Result Value Ref Range   Sodium 140 135 - 145 mmol/L   Potassium 3.8 3.5 - 5.1 mmol/L   Chloride 103 101 - 111 mmol/L   CO2 27 22 - 32 mmol/L   Glucose, Bld 118 (H) 65 - 99 mg/dL   BUN 18 6 - 20 mg/dL   Creatinine, Ser 1.02 0.61 - 1.24 mg/dL   Calcium 8.7 (L) 8.9 - 10.3 mg/dL   GFR calc non Af Amer >60 >60 mL/min   GFR calc Af Amer >60 >60 mL/min   Anion gap 10 5 - 15   No results found.  Assessment/Plan: Diagnosis: Multiple punctate infarcts Labs and images independently reviewed.  Records reviewed and summated above. Stroke: Continue secondary stroke prophylaxis and Risk Factor Modification listed below:   Antiplatelet therapy:   Blood Pressure Management:  Continue current medication with prn's with permisive HTN per primary team Statin Agent:   Motor recovery: Fluoxetine  1. Does the need for close, 24 hr/day medical supervision in concert with the patient's rehab needs make it unreasonable for this patient to be served in a less intensive setting? Yes  2. Co-Morbidities requiring supervision/potential complications: lupus/polymyalgia rheumatica (cont meds), acute on chronic diastolic congestive heart failure (monitor for signs/symptoms fo fluid overload), hx of pulmonary emboli (cont anticoagulation), atrial fibrillation (monitor HR with increased physical activity), CAD status post CABG (cont meds),  hypertension (monitor and provide prns in accordance with increased physical exertion and pain), recent diagnosis of tongue cancer, Vegetation (cont abx, recs per ID), tachypnea (monitor RR and O2 Sats with increased physical exertion), agitation (meds prn), leukocytosis (likely secondary to steroids, cont to monitor for signs and symptoms of infection, further workup if indicated), ABLA on anemia of chronic disease (transfuse if necessary to ensure appropriate perfusion for increased activity tolerance), Thrombocytopenia (< 60,000/mm3 no resistive exercise) 3. Due to bladder management, bowel management, safety, medication administration and patient education, does the patient require 24 hr/day rehab nursing? Yes 4. Does the patient require coordinated care of a physician, rehab nurse, PT (1-2 hrs/day, 5 days/week), OT (1-2 hrs/day, 5 days/week) and SLP (1-2 hrs/day, 5 days/week) to address physical and functional deficits in the context of the above medical diagnosis(es)? Yes Addressing  deficits in the following areas: balance, endurance, locomotion, strength, transferring, bowel/bladder control, bathing, dressing, toileting, cognition, speech, swallowing and psychosocial support 5. Can the patient actively participate in an intensive therapy program of at least 3 hrs of therapy per day at least 5 days per week? Yes 6. The potential for patient to make measurable gains while on inpatient rehab is excellent 7. Anticipated functional outcomes upon discharge from inpatient rehab are supervision and min assist  with PT, supervision and min assist with OT, min assist with SLP. 8. Estimated rehab length of stay to reach the above functional goals is: 18-22 days. 9. Does the patient have adequate social supports and living environment to accommodate these discharge functional goals? No 10. Anticipated D/C setting: SNF 11. Anticipated post D/C treatments: SNF 12. Overall Rehab/Functional Prognosis:  good  RECOMMENDATIONS: This patient's condition is appropriate for continued rehabilitative care in the following setting: Medical workup ongoing.  Further, pt refusing CIR at this time and does not have support at discharge during the day. At this point recommend SNF with CIR outpatient follow up. Patient has agreed to participate in recommended program. Potentially Note that insurance prior authorization may be required for reimbursement for recommended care.  Comment: Rehab Admissions Coordinator to follow up.  Delice Lesch, MD, Mellody Drown Cathlyn Parsons., PA-C 06/15/2016

## 2016-06-15 NOTE — Progress Notes (Signed)
Subjective: This morning, he was fumbling with socks to keep his hands warm, and he denied any new chest pain, difficulty breathing, fever. He would like to go home for a few days to take care of some things and return, and I advised against it. He also appeared to have some word-finding difficulty.   Antibiotics:  Anti-infectives    Start     Dose/Rate Route Frequency Ordered Stop   06/12/16 1100  vancomycin (VANCOCIN) IVPB 1000 mg/200 mL premix  Status:  Discontinued     1,000 mg 200 mL/hr over 60 Minutes Intravenous Every 12 hours 06/11/16 2214 06/12/16 0942   06/12/16 0300  ampicillin (OMNIPEN) 2 g in sodium chloride 0.9 % 50 mL IVPB  Status:  Discontinued     2 g 150 mL/hr over 20 Minutes Intravenous Every 4 hours 06/11/16 2214 06/12/16 0942   06/12/16 0045  acyclovir (ZOVIRAX) 770 mg in dextrose 5 % 150 mL IVPB  Status:  Discontinued     10 mg/kg  77.1 kg 165.4 mL/hr over 60 Minutes Intravenous Every 8 hours 06/12/16 0023 06/14/16 0902   06/11/16 2215  cefTRIAXone (ROCEPHIN) 2 g in dextrose 5 % 50 mL IVPB     2 g 100 mL/hr over 30 Minutes Intravenous Every 12 hours 06/11/16 2204     06/11/16 2215  cefTRIAXone (ROCEPHIN) 2 g in dextrose 5 % 50 mL IVPB  Status:  Discontinued     2 g 100 mL/hr over 30 Minutes Intravenous  Once 06/11/16 2206 06/11/16 2207   06/11/16 2215  vancomycin (VANCOCIN) IVPB 1000 mg/200 mL premix  Status:  Discontinued     1,000 mg 200 mL/hr over 60 Minutes Intravenous  Once 06/11/16 2206 06/11/16 2207   06/11/16 2215  ampicillin (OMNIPEN) 2 g in sodium chloride 0.9 % 50 mL IVPB     2 g 150 mL/hr over 20 Minutes Intravenous  Once 06/11/16 2206 06/11/16 2325   06/11/16 2215  vancomycin (VANCOCIN) 1,500 mg in sodium chloride 0.9 % 500 mL IVPB     1,500 mg 250 mL/hr over 120 Minutes Intravenous  Once 06/11/16 2209 06/12/16 0121   06/11/16 2115  vancomycin (VANCOCIN) IVPB 1000 mg/200 mL premix  Status:  Discontinued     1,000 mg 200 mL/hr over 60  Minutes Intravenous  Once 06/11/16 2110 06/11/16 2209   06/11/16 2115  piperacillin-tazobactam (ZOSYN) IVPB 3.375 g  Status:  Discontinued     3.375 g 12.5 mL/hr over 240 Minutes Intravenous  Once 06/11/16 2110 06/11/16 2209      Medications: Scheduled Meds: . aspirin  300 mg Rectal Daily   Or  . aspirin  325 mg Oral Daily  . atorvastatin  40 mg Oral q1800  . cefTRIAXone (ROCEPHIN)  IV  2 g Intravenous Q12H  . predniSONE  10 mg Oral Q breakfast  . psyllium  1 packet Oral Daily  . QUEtiapine  25 mg Oral QHS  . rivaroxaban  20 mg Oral Daily  . sodium chloride flush  3 mL Intravenous Q12H   Continuous Infusions: PRN Meds:.acetaminophen **OR** acetaminophen, haloperidol lactate, ondansetron **OR** ondansetron (ZOFRAN) IV, ondansetron (ZOFRAN) IV, oxyCODONE, oxyCODONE-acetaminophen    Objective: Weight change:   Intake/Output Summary (Last 24 hours) at 06/15/16 1750 Last data filed at 06/15/16 1034  Gross per 24 hour  Intake          3025.08 ml  Output  2375 ml  Net           650.08 ml   Blood pressure (!) 131/104, pulse 86, temperature 98.5 F (36.9 C), temperature source Oral, resp. rate 14, height 5\' 10"  (1.778 m), weight 180 lb 12.4 oz (82 kg), SpO2 95 %. Temp:  [97.6 F (36.4 C)-98.5 F (36.9 C)] 98.5 F (36.9 C) (02/06 1653) Pulse Rate:  [73-98] 86 (02/06 1653) Resp:  [14-23] 14 (02/06 1653) BP: (131-169)/(79-110) 131/104 (02/06 1653) SpO2:  [94 %-99 %] 95 % (02/06 0731)  Physical Exam: General: middle aged male, resting in bed, playing with socks HEENT: PERRL, EOMI, no scleral icterus, oropharynx clear Cardiac: RRR, mid-diastolic sound best appreciated at the left lower sternal border Pulm: clear to auscultation bilaterally, no wheezes, rales, or rhonchi Abd: soft, nontender, nondistended, BS present Ext: warm and well perfused, no pedal edema Neuro: oriented to name but not place or year, moving all extremities  spontaneously  CBC: @LABBLAST3 (wbc3,Hgb:3,Hct:3,Plt:3,INR:3APTT:3)@   BMET  Recent Labs  06/13/16 0312 06/15/16 0310  NA 136 140  K 4.5 3.8  CL 105 103  CO2 26 27  GLUCOSE 170* 118*  BUN 20 18  CREATININE 1.11 1.02  CALCIUM 8.5* 8.7*     Liver Panel   Recent Labs  06/13/16 0312  PROT 5.9*  ALBUMIN 2.5*  AST 34  ALT 24  ALKPHOS 43  BILITOT 0.4       Sedimentation Rate No results for input(s): ESRSEDRATE in the last 72 hours. C-Reactive Protein No results for input(s): CRP in the last 72 hours.  Micro Results: Recent Results (from the past 720 hour(s))  Culture, blood (Routine x 2)     Status: Abnormal   Collection Time: 06/11/16 10:40 AM  Result Value Ref Range Status   Specimen Description BLOOD RIGHT FOREARM  Final   Special Requests BOTTLES DRAWN AEROBIC AND ANAEROBIC 5CC  Final   Culture  Setup Time   Final    GRAM POSITIVE COCCI IN CHAINS IN BOTH AEROBIC AND ANAEROBIC BOTTLES CRITICAL RESULT CALLED TO, READ BACK BY AND VERIFIED WITH: E BARTON,PHARMD AT 0930 06/12/16 BY L BENFIELD    Culture VIRIDANS STREPTOCOCCUS (A)  Final   Report Status 06/14/2016 FINAL  Final   Organism ID, Bacteria VIRIDANS STREPTOCOCCUS  Final      Susceptibility   Viridans streptococcus - MIC*    PENICILLIN 0.12 SENSITIVE Sensitive     CEFTRIAXONE <=0.12 SENSITIVE Sensitive     ERYTHROMYCIN >=8 RESISTANT Resistant     LEVOFLOXACIN 1 SENSITIVE Sensitive     VANCOMYCIN 0.5 SENSITIVE Sensitive     * VIRIDANS STREPTOCOCCUS  Blood Culture ID Panel (Reflexed)     Status: Abnormal   Collection Time: 06/11/16 10:40 AM  Result Value Ref Range Status   Enterococcus species NOT DETECTED NOT DETECTED Final   Listeria monocytogenes NOT DETECTED NOT DETECTED Final   Staphylococcus species NOT DETECTED NOT DETECTED Final   Staphylococcus aureus NOT DETECTED NOT DETECTED Final   Streptococcus species DETECTED (A) NOT DETECTED Final    Comment: Not Enterococcus species,  Streptococcus agalactiae, Streptococcus pyogenes, or Streptococcus pneumoniae. CRITICAL RESULT CALLED TO, READ BACK BY AND VERIFIED WITH: E BARTON,PHARMD AT 0930 06/12/16 BY L BENFIELD    Streptococcus agalactiae NOT DETECTED NOT DETECTED Final   Streptococcus pneumoniae NOT DETECTED NOT DETECTED Final   Streptococcus pyogenes NOT DETECTED NOT DETECTED Final   Acinetobacter baumannii NOT DETECTED NOT DETECTED Final   Enterobacteriaceae species NOT DETECTED NOT DETECTED Final  Enterobacter cloacae complex NOT DETECTED NOT DETECTED Final   Escherichia coli NOT DETECTED NOT DETECTED Final   Klebsiella oxytoca NOT DETECTED NOT DETECTED Final   Klebsiella pneumoniae NOT DETECTED NOT DETECTED Final   Proteus species NOT DETECTED NOT DETECTED Final   Serratia marcescens NOT DETECTED NOT DETECTED Final   Haemophilus influenzae NOT DETECTED NOT DETECTED Final   Neisseria meningitidis NOT DETECTED NOT DETECTED Final   Pseudomonas aeruginosa NOT DETECTED NOT DETECTED Final   Candida albicans NOT DETECTED NOT DETECTED Final   Candida glabrata NOT DETECTED NOT DETECTED Final   Candida krusei NOT DETECTED NOT DETECTED Final   Candida parapsilosis NOT DETECTED NOT DETECTED Final   Candida tropicalis NOT DETECTED NOT DETECTED Final  Culture, blood (Routine x 2)     Status: Abnormal   Collection Time: 06/11/16 12:23 PM  Result Value Ref Range Status   Specimen Description BLOOD RIGHT ANTECUBITAL  Final   Special Requests BOTTLES DRAWN AEROBIC AND ANAEROBIC 5CC  Final   Culture  Setup Time   Final    GRAM POSITIVE COCCI IN CHAINS IN BOTH AEROBIC AND ANAEROBIC BOTTLES CRITICAL RESULT CALLED TO, READ BACK BY AND VERIFIED WITH: E BARTON,PHARMD AT 0930 06/12/16 BY L BENFIELD    Culture (A)  Final    VIRIDANS STREPTOCOCCUS SUSCEPTIBILITIES PERFORMED ON PREVIOUS CULTURE WITHIN THE LAST 5 DAYS.    Report Status 06/14/2016 FINAL  Final  Urine culture     Status: Abnormal   Collection Time: 06/11/16  4:36  PM  Result Value Ref Range Status   Specimen Description URINE, RANDOM  Final   Special Requests NONE  Final   Culture MULTIPLE SPECIES PRESENT, SUGGEST RECOLLECTION (A)  Final   Report Status 06/12/2016 FINAL  Final  MRSA PCR Screening     Status: None   Collection Time: 06/12/16  6:40 AM  Result Value Ref Range Status   MRSA by PCR NEGATIVE NEGATIVE Final    Comment:        The GeneXpert MRSA Assay (FDA approved for NASAL specimens only), is one component of a comprehensive MRSA colonization surveillance program. It is not intended to diagnose MRSA infection nor to guide or monitor treatment for MRSA infections.   Culture, blood (routine x 2)     Status: None (Preliminary result)   Collection Time: 06/14/16  2:35 AM  Result Value Ref Range Status   Specimen Description BLOOD LEFT ANTECUBITAL  Final   Special Requests BOTTLES DRAWN AEROBIC AND ANAEROBIC 6CC EA  Final   Culture NO GROWTH 1 DAY  Final   Report Status PENDING  Incomplete  Culture, blood (routine x 2)     Status: None (Preliminary result)   Collection Time: 06/14/16  2:42 AM  Result Value Ref Range Status   Specimen Description BLOOD BLOOD RIGHT HAND  Final   Special Requests BOTTLES DRAWN AEROBIC AND ANAEROBIC  5CC EA  Final   Culture NO GROWTH 1 DAY  Final   Report Status PENDING  Incomplete    Studies/Results: No results found.    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 male with  CAD s/p CABG, paroxysmal a-fib and a-flutter s/p MAZE procedure, mitral regurgitation s/p valve repair, polymyalgia rheumatica on chronic steroid therapy, lupus anticoagulant positivity on chronic anticoagulation hospitalized for Strep viridans endocarditis found to have acute/subacute CVA.  Strep viridans endocarditis Repeat sensitivities do not appear to have populated in the system and no technician available to review with over the phone. -Continue ceftriaxone [Abx Day 5]. -Recommend TEE to assess extent of mass though assessed to be stable from Cardiology standpoint  Acute/subacute CVA: Suspect septic emboli though cannot exclude underlying hypercoagulable state and paroxysmal a-fib/a-flutter.  -Xarelto per Neurology  Polymyalgia rheumatic on chronic steroid therapy: Continue prednisone 10 mg which is double his home regimen and best mitigates risk of adrenal crisis with over immunosuppression in the setting of an active infection.    LOS: 4 days   Peter Bruce 06/15/2016, 5:50 PM

## 2016-06-15 NOTE — Progress Notes (Signed)
Speech Language Pathology Treatment: Dysphagia  Patient Details Name: Peter Bruce MRN: [id number] DOB: 10-Nov-1950 Today's Date: 06/15/2016 Time: [id number] SLP Time Calculation (min) (ACUTE ONLY): 18 min  Assessment / Plan / Recommendation Clinical Impression  Pt demonstrates significant confusion, resistant to interventions. Wet vocal quality noted at baseline after meal. SLP was able to encourage pt to take 2 sips of water which did result in delayed throat clearing and wet vocal quality. Pt is at significant safety risk, attempting to untangle lines, get out of bed. Pt would not have a toiletry bag that he wanted to use for oral care, SLP assisted him in set up and basic functional problem solving. Inside the bag pt had a bottle of prescription medication, Diazepam. It did not appear that he had taken any. Informed MD and RN and gave RN the medication.    HPI HPI: Peter Bruce an 66 y.o.male. with CAD s/p CABG, paroxysmal atrial fibrillation and atrial flutter s/p MAZE procedure and on chronic anticoagulation, mitral regurgitation s/p valve repair, polymyalgia rheumatica on chronic steroid therapy, lupus anticoagulant positivity hospitalized for Strep viridans bacteremia.  On 06/10/16, he underwent biopsy of a lesion of the right oral cavity by ENT. He did not receive antibiotic prophylaxis as he wanted to have the biopsy done in the office. Unfortunately, he developed fever, weakness, poor appetite and presented to the ED on 06/11/16. He was started empirically on vancomycin, piperacillin/tazobactam, acyclovir, ampicillin which was narrowed to ceftriaxone and acyclovir. C/T/L spine MR imaging waswithout findings of diskitis and osteomyelitis, but brain MRI was notable for multiple acute and subacute infarcts suggestive of central embolus with left lateral temporal lobe infarction. TTE yesterday suggested a vegetation of the left ventricular aspect of the anterior leaflet      SLP Plan  MBS      Recommendations  Diet recommendations: Regular;Thin liquid Liquids provided via: Cup Medication Administration: Whole meds with liquid                General recommendations: Rehab consult Oral Care Recommendations: Oral care QID Follow up Recommendations: Inpatient Rehab Plan: MBS       GO                Peter Bruce, Peter Bruce 06/15/2016, 9:29 AM

## 2016-06-15 NOTE — Progress Notes (Signed)
I met with pt and his wife at bedside. I discussed an inpt rehab admission IF pt would agree to participate and pt had the needed care at home that is felt needed once discharged. 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 She can provide 24/7 assist for pt and she prefers an inpt rehab admission. Pt states she has discussed spouse's case with Dr. Tommy Medal and needs more information from Dr. Ricard Dillon before proceeding with dispo needs. She also states that pt has always pushed through when in the hospital and will always want to go home rather that remain hospitalized for an extended time. I will follow his progress and participation with therapies to assist in planning dispo once pt medically ready for d/c. [phone]

## 2016-06-15 NOTE — Progress Notes (Addendum)
Subjective:  Feels much better, no chest pain or shortness of breath. Appears less confused. Wife present the bedside.  Objective:  Vital Signs in the last 24 hours: Temp:  [97.6 F (36.4 C)-98.5 F (36.9 C)] 97.8 F (36.6 C) (02/06 1209) Pulse Rate:  [73-98] 89 (02/06 1209) Resp:  [17-23] 19 (02/06 1209) BP: (135-169)/(79-110) 147/96 (02/06 1209) SpO2:  [94 %-100 %] 95 % (02/06 0731)  Intake/Output from previous day: 02/05 0701 - 02/06 0700 In: 5133.8 [P.O.:150; I.V.:4503; IV Piggyback:480.8] Out: 2650 [Urine:2650]  Physical Exam: General appearance: alert, cooperative, appears stated age and no distress Lungs: clear to auscultation bilaterally Heart: S1-S2 is normal. There is a mid diastolic murmur at the apex. Abdomen: soft, non-tender; bowel sounds normal; no masses,  no organomegaly Extremities: extremities normal, atraumatic, no cyanosis or edema Pulses: 2+ and symmetric Neurologic: Grossly normal  Lab Results: BMP  Recent Labs  06/12/16 1118 06/13/16 0312 06/15/16 0310  NA 137 136 140  K 4.3 4.5 3.8  CL 104 105 103  CO2 28 26 27   GLUCOSE 183* 170* 118*  BUN 19 20 18   CREATININE 1.14 1.11 1.02  CALCIUM 8.4* 8.5* 8.7*  GFRNONAA >60 >60 >60  GFRAA >60 >60 >60    CBC  Recent Labs Lab 06/13/16 0312 06/15/16 0310  WBC 24.7* 18.8*  RBC 3.16* 3.03*  HGB 9.7* 9.4*  HCT 29.0* 28.1*  PLT 99* 106*  MCV 91.8 92.7  MCH 30.7 31.0  MCHC 33.4 33.5  RDW 13.5 14.1  LYMPHSABS 0.6*  --   MONOABS 0.5  --   EOSABS 0.0  --   BASOSABS 0.0  --     HEMOGLOBIN A1C Lab Results  Component Value Date   HGBA1C 5.7 (H) 06/14/2016   MPG 117 06/14/2016    Recent Labs  09/19/15 1139 06/12/16 1118  TSH 1.98 1.071   Lipid Panel     Component Value Date/Time   CHOL 170 06/14/2016 0235   TRIG 133 06/14/2016 0235   HDL 24 (L) 06/14/2016 0235   CHOLHDL 7.1 06/14/2016 0235   VLDL 27 06/14/2016 0235   LDLCALC 119 (H) 06/14/2016 0235     Recent Labs   11/19/15 1340 06/12/16 1118 06/13/16 0312  PROT 6.6 6.2*  6.2* 5.9*  ALBUMIN 3.5 2.5*  2.5* 2.5*  AST 25 39  38 34  ALT 25 23  23 24   ALKPHOS 50 47  48 43  BILITOT 0.9 0.7  0.6 0.4  BILIDIR  --  0.2  --   IBILI  --  0.5  --     Imaging: Imaging results have been reviewed  Cardiac Studies:  EKG 06/11/2016: Normal sinus rhythm/sinus tachycardia at the rate of 100 bpm, normal axis. No evidence of ischemia, normal EKG. PVC.  Echo: 06/13/2016: Normal LV systolic function. Mitral valve, especially the anterior mitral leaflet is severely thickened and appears myxomatous with suspicion for vegetation, Measures approximately 1.5-2 cm x 1 cm. The vegetation is fixed. Posterior mitral leaflet is restricted in movement. There is moderate to severe mitral stenosis with a mean gradient of 22 mmHg, mitral valve area of 1.15 cm. Left atrium was moderate to severely dilated, moderate pulmonary hypertension, PA pressure 45 mmHg.  Assessment/Plan:  1. 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 2. History of minimally invasive mitral valve repair on 11/21/2015 3. Atherosclerosis of native coronary artery of native heart with angina pectoris S/P CABG x  3 on 05/29/2015 withLIMA to LAD, SVG to OM2, SVG to RCA; and Maze procedure and clipping of left atrial appendage. 4. Paroxysmal atrial fibrillation and atrial flutter, S/P Maze procedure and left atrial appendage closure during CABG, presently on Xarelto. Continue the same. CHA2DS2-VASCScore: Risk Score 4.0 Yearly risk of stroke 4%  5. Hypercoagulable state, primary. On long-term anticoagulation with Coumadin. 6. Hypertension 7. Hyperlipidemia 8. Embolic infarct in the brain, may represent endocarditis with septic emboli.  Recommendation: The MRI reveals very small strokes and risk of hemorrhagic conversion is fairly low, I have discussed with neurology. His risk  for cardioembolic phenomena both from atrial fibrillation and also patient being hypercoagulable and essentially in bedbound, will also pose high thrombotic risk. The therapy for endocarditis was initiated failure early, physical examination has not changed since admission to now, there is no clinical evidence of mitral regurgitation, I suspect we will be able to salvage his mitral valve without need for repeat surgery. He appears to be responding clinically with no recurrence of any fever, mentally he is much more alert, he feels his strength is coming back. Hence stable from cardiac standpoint, no clinical evidence of congestive heart failure. Appreciate all clinicians and consultants view and opinions. I have met his wife and discussed with her in detail. They're aware of risks associated with anticoagulation also.  He requests pain medication due to severe back pain on laying down, agree with getting physical medicine involved, needs therapy, he can ablate from cardiac standpoint, I'll start him on Percocet 1 by mouth 3 times a day when necessary.  Adrian Prows, M.D. 06/15/2016, 3:26 PM [facility] Pager: [phone] Office: [phone] If no answer: [phone]

## 2016-06-15 NOTE — Plan of Care (Signed)
Problem: Coping: Goal: Ability to identify appropriate support needs will improve Outcome: Progressing Patient has improved cognitive function from admission, however;still shows some confusion and wishes to go home

## 2016-06-15 NOTE — Progress Notes (Signed)
Physical Therapy Treatment Patient Details Name: Peter Bruce MRN: [id number] DOB: [birth year] Today's Date: 06/15/2016    History of Present Illness Patient is a 66 yo male admitted 06/11/16 with fever and confusion, and LE weakness.  Patient was found to be septic, and encephalopathic. 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  Per chart, poss embolic infective endocarditis.     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    PT Comments    Patient seen for mobility progression. Tolerated session well with focus on OOB functional gait in room with cognitive processing activities. Patient was able to mobilize with Min assist around the room but showed difficulty with use of RW, problem solving, awareness, and attention. Patient was able to remain engaged actively throughout session. At this time, feel patient would be more appropriate for higher level rehabilitation addressing mult discipline comprehensive therapies. Highly recommend CIR at this time, disposition recommendation updated. Will continue to see and progress as tolerated.  Follow Up Recommendations  CIR;Supervision/Assistance - 24 hour     Equipment Recommendations  None recommended by PT    Recommendations for Other Services       Precautions / Restrictions Precautions Precautions: Fall Precaution Comments: Recent falls at home.  Confusion. Restrictions Weight Bearing Restrictions: No    Mobility  Bed Mobility Overal bed mobility: Needs Assistance Bed Mobility: Rolling;Sidelying to Sit;Sit to Sidelying Rolling: Min guard Sidelying to sit: Min assist     Sit to sidelying: Min assist General bed mobility comments: Min assist to elevate trunk to upright position at EOB, increased time and effort to perform. upon return to supine, min assist for  elevation of LEs back to bed and cues for repositioning.  Transfers Overall transfer level: Needs assistance Equipment used: Rolling walker (2 wheeled) Transfers: Sit to/from Stand Sit to Stand: Min assist         General transfer comment: Min assist for balance and stability in elevation to standing, cues for hand placement with use of RW  Ambulation/Gait Ambulation/Gait assistance: Min assist Ambulation Distance (Feet): 24 Feet Assistive device: Rolling walker (2 wheeled) Gait Pattern/deviations: Step-to pattern;Decreased stride length;Shuffle;Drifts right/left;Narrow base of support Gait velocity: decreased Gait velocity interpretation: <1.8 ft/sec, indicative of risk for recurrent falls General Gait Details: min assist for stability with straight strides, increased assist for negotiation of objects during in room navigation. Multi modal assist for safety and positioning within RW during turns, patient with poor ability to place stride cauing him to step on his own feet and scissor during tasks. Was able to take 4 backwards steps during mobility as well with min assist   Stairs            Wheelchair Mobility    Modified Rankin (Stroke Patients Only) Modified Rankin (Stroke Patients Only) Pre-Morbid Rankin Score: No symptoms Modified Rankin: Moderately severe disability     Balance Overall balance assessment: Needs assistance;History of Falls Sitting-balance support: Feet unsupported Sitting balance-Leahy Scale: Good Sitting balance - Comments: able to don/doff socks EOB    Standing balance support: During functional activity;No upper extremity supported Standing balance-Leahy Scale: Fair Standing balance comment: able to stand for breif release of UE support                    Cognition Arousal/Alertness: Awake/alert Behavior During  Therapy: Flat affect Overall Cognitive Status: Impaired/Different from baseline Area of Impairment:  Attention;Memory;Safety/judgement;Following commands;Problem solving   Current Attention Level: Selective Memory: Decreased short-term memory Following Commands: Follows one step commands consistently;Follows multi-step commands inconsistently Safety/Judgement: Decreased awareness of safety;Decreased awareness of deficits   Problem Solving: Slow processing;Decreased initiation;Difficulty sequencing;Requires verbal cues;Requires tactile cues General Comments: patient oriented to self, place and stated he is in the hospital because of some heart issues. Patient remains slow to process information and cues but appears receptive this afternoon. Patient with decreased safety awareness and slow problem solving.    Exercises      General Comments        Pertinent Vitals/Pain Pain Assessment: Faces Faces Pain Scale: Hurts little more Pain Location: back Pain Descriptors / Indicators: Aching;Moaning;Grimacing Pain Intervention(s): Monitored during session    Home Living                      Prior Function            PT Goals (current goals can now be found in the care plan section) Acute Rehab PT Goals Patient Stated Goal: to go home  PT Goal Formulation: With family Time For Goal Achievement: 06/20/16 Potential to Achieve Goals: Fair    Frequency    Min 3X/week      PT Plan Discharge plan needs to be updated    Co-evaluation             End of Session Equipment Utilized During Treatment: Gait belt Activity Tolerance: Patient limited by fatigue;Patient limited by lethargy Patient left: in bed;with call bell/phone within reach;with family/visitor present     Time: [id number] PT Time Calculation (min) (ACUTE ONLY): 21 min  Charges:  $Gait Training: 8-22 mins                    G Codes:      Duncan Dull 07-01-16, 4:34 PM Alben Deeds, [place] DPT  [phone]

## 2016-06-15 NOTE — Evaluation (Signed)
Speech Language Pathology Evaluation Patient Details Name: Peter Bruce MRN: [id number] DOB: Feb 01, 1951 Today's Date: 06/15/2016 Time: [id number] SLP Time Calculation (min) (ACUTE ONLY): 24 min  Problem List:  Patient Active Problem List   Diagnosis Date Noted  . Tongue cancer ([place]) 06/14/2016  . Cerebral embolism with cerebral infarction 06/14/2016  . Acute encephalopathy   . Streptococcal bacteremia   . Prosthetic valve endocarditis ([place])   . Septic embolism ([place])   . Hypercoagulable state ([place])   . Other streptococcal sepsis ([place]) 06/12/2016  . Sepsis, unspecified organism ([place]) 06/11/2016  . Hyponatremia 06/11/2016  . Normocytic anemia 06/11/2016  . S/P minimally invasive mitral valve repair 11/21/2015  . Essential hypertension   . Mitral regurgitation 10/30/2015  . Fatigue 10/14/2015  . Current use of long term anticoagulation 09/27/2015  . Bilateral pleural effusion 09/27/2015  . Chronic diastolic CHF (congestive heart failure) ([place]) 09/27/2015  . Respiratory failure with hypoxia ([place]) 09/09/2015  . Atrial flutter ([place])   . S/P CABG x 3 and maze procedure 05/29/2015  . Coronary artery disease involving native coronary artery 05/15/2015  . Dyslipidemia 04/14/2015  . Polymyalgia rheumatica--rheumatol [place]-2016 12/19/2013  . Obesity (BMI 30-39.9)   . BPH (benign prostatic hyperplasia) 08/30/2011  . Paroxysmal atrial fibrillation ([place]) 08/30/2011  . Chronic pain 08/30/2011  . Anxiety 04/21/2011  . History of pulmonary embolism 10/02/2008  . Lupus anticoagulant positive 07/29/2008   Past Medical History:  Past Medical History:  Diagnosis Date  . Acute CHF (congestive heart failure) ([place]) 11/01/2015  . Acute on chronic diastolic heart failure ([place]) 09/27/2015  . Acute pulmonary embolus ([place]) 2010  . Acute respiratory failure with hypoxia ([place]) 09/09/2015  . Arthritis   . Atrial fibrillation [facility])    s/p inpatient DCCV 09/11/2015  . Atrial flutter ([place])   . CHF (congestive  heart failure) ([place])   . Coronary artery disease involving native coronary artery 05/15/2015   multivessel  . Dysrhythmia    A-fib  . Essential hypertension   . Fibromyalgia   . History of kidney stones   . Hypercholesterolemia   . Hypertension   . Lupus anticoagulant positive 07/29/2008  . Mitral regurgitation 10/30/2015  . PMR (polymyalgia rheumatica) (HCC)   . Pneumonia   . 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 S/P Maze operation for atrial fibrillation 05/29/2015   Complete bilateral atrial lesion set via median sternotomy using bipolar radiofrequency and cryothermy ablation with clipping of LA appendage  . 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 APPENDECTOMY    . arm surgery  Right    wrist and elbow  . CARDIAC CATHETERIZATION N/A 05/15/2015   Procedure: Left Heart Cath and Coronary Angiography;  Surgeon: Peter M Martinique, MD;  Location: [place] CV [facility];  Service: Cardiovascular;  Laterality: N/A;  . CARDIAC CATHETERIZATION N/A 11/17/2015   Procedure: Right/Left Heart Cath and Coronary/Graft Angiography;  Surgeon: Adrian Prows, MD;  Location: [place] [facility];  Service: Cardiovascular;  Laterality: N/A;  . CARDIOVERSION N/A 09/11/2015   Procedure: CARDIOVERSION;  Surgeon: Lelon Perla, MD;  Location: [place];  Service: Cardiovascular;  Laterality:  N/A;  . CHEST TUBE INSERTION  11/21/2015   Procedure: LEFT CHEST TUBE INSERTION;  Surgeon: Rexene Alberts, MD;  Location: [place];  Service: Open Heart Surgery;;  . CLIPPING OF ATRIAL APPENDAGE  05/29/2015   Procedure: CLIPPING OF LEFT ATRIAL APPENDAGE;  Surgeon: Rexene Alberts, MD;  Location: [place];  Service: Open Heart Surgery;;  45 AtriClip PRO 145  . CORONARY ARTERY BYPASS GRAFT N/A 05/29/2015   Procedure: CORONARY ARTERY BYPASS GRAFTING (CABG), ON PUMP, TIMES THREE, USING LEFT INTERNAL MAMMARY ARTERY, BILATERAL GREATER SAPHENOUS VEINS HARVESTED ENDOSCOPICALLY;  Surgeon: Rexene Alberts, MD;  Location: [place];  Service: Open Heart Surgery;  Laterality: N/A;  . FRACTURE SURGERY     right wrist  . HERNIA REPAIR    . MAZE N/A 05/29/2015   Procedure: MAZE;  Surgeon: Rexene Alberts, MD;  Location: [place];  Service: Open Heart Surgery;  Laterality: N/A;  Complete Bi-Atrial Lesion set with Ablation and Cryothermy  . MITRAL VALVE REPAIR Right 11/21/2015   Procedure: MINIMALLY INVASIVE REOPERATION FOR MITRAL VALVE REPAIR;  Surgeon: Rexene Alberts, MD;  Location: [place];  Service: Open Heart Surgery;  Laterality: Right;  . PERIPHERAL VASCULAR CATHETERIZATION Bilateral 11/17/2015   Procedure: Renal Angiography;  Surgeon: Adrian Prows, MD;  Location: [place] CV LAB;  Service: Cardiovascular;  Laterality: Bilateral;  . PERIPHERAL VASCULAR CATHETERIZATION N/A 11/17/2015   Procedure: Abdominal Aortogram;  Surgeon: Adrian Prows, MD;  Location: [place] CV LAB;  Service: Cardiovascular;  Laterality: N/A;  . SHOULDER SURGERY Left    clavicular fracture  . TEE WITHOUT CARDIOVERSION N/A 05/29/2015   Procedure: TRANSESOPHAGEAL ECHOCARDIOGRAM (TEE);  Surgeon: Rexene Alberts, MD;  Location: [place];  Service: Open Heart Surgery;  Laterality: N/A;  . TEE WITHOUT CARDIOVERSION N/A 10/30/2015   Procedure: TRANSESOPHAGEAL ECHOCARDIOGRAM (TEE);  Surgeon: Adrian Prows, MD;  Location: [place];  Service: Cardiovascular;  Laterality: N/A;  . TEE WITHOUT CARDIOVERSION N/A 11/21/2015   Procedure: TRANSESOPHAGEAL ECHOCARDIOGRAM (TEE);  Surgeon: Rexene Alberts, MD;  Location: [place];  Service: Open Heart Surgery;  Laterality: N/A;   HPI:  Peter Bruce an 66 y.o.male. with CAD s/p CABG, paroxysmal  atrial fibrillation and atrial flutter s/p MAZE procedure and on chronic anticoagulation, mitral regurgitation s/p valve repair, polymyalgia rheumatica on chronic steroid therapy, lupus anticoagulant positivity hospitalized for Strep viridans bacteremia.  On 06/10/16, he underwent biopsy of a lesion of the right oral cavity by ENT. He did not receive antibiotic prophylaxis as he wanted to have the biopsy done in the office. Unfortunately, he developed fever, weakness, poor appetite and presented to the ED on 06/11/16. He was started empirically on vancomycin, piperacillin/tazobactam, acyclovir, ampicillin which was narrowed to ceftriaxone and acyclovir. C/T/L spine MR imaging waswithout findings of diskitis and osteomyelitis, but brain MRI was notable for multiple acute and subacute infarcts suggestive of central embolus with left lateral temporal lobe infarction. TTE yesterday suggested a vegetation of the left ventricular aspect of the anterior leaflet   Assessment / Plan / Recommendation Clinical Impression  Pt demonstrates impaired cognitive funciton impacting attention, memory and awareness. Pt is able to discuss familiar topics and complete very basic functional tasks, but is easily distracted by environment. His confusion is improving per MD, and he does demonstrate improving short term memory but is still quite disoriented and cannot recall events surround dx of oral cancer. His awareness of deficits is non existant and he requires supervision for basic functional tasks for safety. Pt  would benefit from ongoing SLp interventions for orientation, attention and memory. Recommend CIR at d/c.     SLP Assessment  Patient needs continued Speech Lanaguage Pathology Services    Follow Up Recommendations  Inpatient Rehab    Frequency and Duration min 2x/week  2 weeks      SLP Evaluation Cognition  Overall Cognitive Status: Impaired/Different from baseline Arousal/Alertness: Awake/alert Orientation  Level: Oriented to person;Disoriented to place;Disoriented to time;Disoriented to situation Attention: Focused;Sustained Focused Attention: Appears intact Sustained Attention: Appears intact Selective Attention: Impaired Selective Attention Impairment: Functional basic Memory: Impaired Memory Impairment: Decreased short term memory Decreased Short Term Memory: Verbal basic Awareness: Impaired Awareness Impairment: Intellectual impairment;Emergent impairment;Anticipatory impairment Problem Solving: Impaired Problem Solving Impairment: Functional basic;Verbal basic Safety/Judgment: Impaired       Comprehension  Auditory Comprehension Overall Auditory Comprehension: Impaired Yes/No Questions: Not tested Commands: Impaired One Step Basic Commands: 50-74% accurate Two Step Basic Commands: 0-24% accurate Conversation: Complex Interfering Components: Attention;Processing speed;Working Field seismologist: Public house manager: Not tested Reading Comprehension Reading Status: Not tested    Expression Verbal Expression Overall Verbal Expression: Appears within functional limits for tasks assessed   Oral / Motor  Oral Motor/Sensory Function Overall Oral Motor/Sensory Function: Within functional limits Motor Speech Overall Motor Speech: Impaired Respiration: Within functional limits Phonation: Normal Resonance: Within functional limits Articulation: Impaired Level of Impairment: Word Intelligibility: Intelligibility reduced Word: 75-100% accurate Phrase: 75-100% accurate Sentence: 75-100% accurate Conversation: 75-100% accurate   GO                   [employer], MA CCC-SLP [phone]  Lynann Beaver 06/15/2016, 10:25 AM

## 2016-06-15 NOTE — Progress Notes (Signed)
[facility] Progress Note  Patient: Peter Bruce [id number]   PCP: Adrian Prows, MD DOB: 1951/03/17   DOA: 06/11/2016   DOS: 06/15/2016   Date of Service: the patient was seen and examined on 06/15/2016  Subjective: Still somewhat confused, no acute complaint.  Brief hospital course: Pt. 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 Currently further plan is continue IV antibiotics and further workup.  Assessment and Plan: 1. Strep viridans sepsis with bacteremia, meningitis, septic emboli and native valve endocarditis (HCC) Multiple acute and subacute CVA concerning for embolic infective endocarditis Patient's blood culture is positive for streptococcal species.  MRSA PCR is negative. Echocardiogram shows mitral valve vegetation 11 mm x 19 mm.. Initial plan was to perform an LP, currently neurology as well as infectious disease does not feel that the patient requires LP, agree with that. Sensitivities were checked, currently on IV ceftriaxone. ID considering further input regarding antibiotics. Repeat culture currently not growing any organism. Initially was on ampicillin and vancomycin and acyclovir, all discontinued currently. For LP the patient has received 5 mg IV vitamin K. Urine culture shows multiple species.  Chest x-ray does not show any pneumonia. Appreciate input from cardiology as well as neurology regarding this patient. Highly appreciate ID input as well. Cardiology feels that the patient should be managed with IV antibiotics for his endocarditis due to his history of 2 open heart surgeries in the past.  2. History of pulmonary embolism   Paroxysmal atrial fibrillation (HCC) On Coumadin at home.  patient received IV vitamin K for LP. Currently neurology and cardiology feels that the patient be started  on Xarelto. Agree with that. Continue monitoring. Appreciate input.  3. Small Multiple acute and subacute CVA. MRI brain moderate infarct in left temporal lobe, also multiple smaller infarct in inferior left occipital, right cerebellum, right cerebral, right caudate impresses that'll cortex. This findings are concerning for embolic phenomenon most likely septic emboli. Neurology consulted. Speech therapy recommends MBS PTOT recommends CIR, consulted. Vascular Doppler unremarkable for any significant stenosis. Started on Xarelto  4. Polymyalgia rheumatica--rheumatol Hawkes-2016 Worsening of the leukocytosis likely due to steroids On chronic prednisone at home, changing to 10 mg prednisone here in the hospital. Received IV Decadron for suspected meningitis on presentation.  5.  Chronic diastolic CHF (congestive heart failure) ([place]) 6.  Essential hypertension EF 50-55%. While receiving IV hydration for sepsis monitor for volume overload. Holding home Toprol-XL as well as amlodipine at present. Blood pressure currently soft. Currently holding home antihypertensive medications.  Bowel regimen: last BM 06/14/2016 Diet: CARDIAC DIET DVT Prophylaxis: Xarelto  Advance goals of care discussion: full code  Family Communication: no family was present at bedside, at the time of interview.   Disposition:  Discharge to SNF. Expected discharge date: 06/17/2016, pending culture clearance and PICC line improvement in mentation  Consultants: IR, infectious disease, neurology, cardiology Procedures: LP  Antibiotics: Anti-infectives    Start     Dose/Rate Route Frequency Ordered Stop   06/12/16 1100  vancomycin (VANCOCIN) IVPB 1000 mg/200 mL premix  Status:  Discontinued     1,000 mg 200 mL/hr over 60 Minutes Intravenous Every 12 hours 06/11/16 2214 06/12/16 0942   06/12/16 0300  ampicillin (OMNIPEN) 2 g in sodium chloride 0.9 % 50 mL IVPB  Status:  Discontinued     2 g 150  mL/hr over 20  Minutes Intravenous Every 4 hours 06/11/16 2214 06/12/16 0942   06/12/16 0045  acyclovir (ZOVIRAX) 770 mg in dextrose 5 % 150 mL IVPB  Status:  Discontinued     10 mg/kg  77.1 kg 165.4 mL/hr over 60 Minutes Intravenous Every 8 hours 06/12/16 0023 06/14/16 0902   06/11/16 2215  cefTRIAXone (ROCEPHIN) 2 g in dextrose 5 % 50 mL IVPB     2 g 100 mL/hr over 30 Minutes Intravenous Every 12 hours 06/11/16 2204     06/11/16 2215  cefTRIAXone (ROCEPHIN) 2 g in dextrose 5 % 50 mL IVPB  Status:  Discontinued     2 g 100 mL/hr over 30 Minutes Intravenous  Once 06/11/16 2206 06/11/16 2207   06/11/16 2215  vancomycin (VANCOCIN) IVPB 1000 mg/200 mL premix  Status:  Discontinued     1,000 mg 200 mL/hr over 60 Minutes Intravenous  Once 06/11/16 2206 06/11/16 2207   06/11/16 2215  ampicillin (OMNIPEN) 2 g in sodium chloride 0.9 % 50 mL IVPB     2 g 150 mL/hr over 20 Minutes Intravenous  Once 06/11/16 2206 06/11/16 2325   06/11/16 2215  vancomycin (VANCOCIN) 1,500 mg in sodium chloride 0.9 % 500 mL IVPB     1,500 mg 250 mL/hr over 120 Minutes Intravenous  Once 06/11/16 2209 06/12/16 0121   06/11/16 2115  vancomycin (VANCOCIN) IVPB 1000 mg/200 mL premix  Status:  Discontinued     1,000 mg 200 mL/hr over 60 Minutes Intravenous  Once 06/11/16 2110 06/11/16 2209   06/11/16 2115  piperacillin-tazobactam (ZOSYN) IVPB 3.375 g  Status:  Discontinued     3.375 g 12.5 mL/hr over 240 Minutes Intravenous  Once 06/11/16 2110 06/11/16 2209        Objective: Physical Exam: Vitals:   06/15/16 0508 06/15/16 0600 06/15/16 0731 06/15/16 1209  BP: (!) 169/93 (!) 135/97 (!) 156/79 (!) 147/96  Pulse: 76 75 98 89  Resp: 17 18 (!) 23 19  Temp:   97.9 F (36.6 C) 97.8 F (36.6 C)  TempSrc:   Oral Oral  SpO2:  97% 95%   Weight:      Height:        Intake/Output Summary (Last 24 hours) at 06/15/16 1605 Last data filed at 06/15/16 1034  Gross per 24 hour  Intake          3025.08 ml  Output             2675 ml    Net           350.08 ml   Filed Weights   06/11/16 1026 06/12/16 0639 06/14/16 0735  Weight: 77.1 kg (170 lb) 79.7 kg (175 lb 11.3 oz) 82 kg (180 lb 12.4 oz)    General: Alert, Awake and Oriented to Time, Place and Person. Appear in mild distress, affect appropriate Eyes: PERRL, Conjunctiva normal ENT: Oral Mucosa clear moist. Neck: no JVD, no Abnormal Mass Or lumps Cardiovascular: S1 and S2 Present, aortic systolic Murmur, Respiratory: Bilateral Air entry equal and Decreased, no use of accessory muscle, Clear to Auscultation, no Crackles, no wheezes Abdomen: Bowel Sound present, Soft and no tenderness Skin: no redness, no Rash, no induration Extremities: no Pedal edema, no calf tenderness Neurologic: Grossly no focal neuro deficit. Bilaterally Equal motor strength  Data Reviewed: CBC:  Recent Labs Lab 06/11/16 1035 06/11/16 1933 06/12/16 1118 06/13/16 0312 06/15/16 0310  WBC 21.3*  --  16.2* 24.7* 18.8*  NEUTROABS 19.2*  --  15.5* 23.6*  --   HGB 11.3* 10.9* 10.0* 9.7* 9.4*  HCT 34.1* 32.0* 30.3* 29.0* 28.1*  MCV 91.9  --  92.7 91.8 92.7  PLT 127*  --  91* 99* [id number]*   Basic Metabolic Panel:  Recent Labs Lab 06/11/16 1933 06/12/16 1118 06/13/16 0312 06/15/16 0310  NA 132* 137 136 140  K 4.4 4.3 4.5 3.8  CL 98* 104 105 103  CO2  --  28 26 27   GLUCOSE 100* 183* 170* 118*  BUN 21* 19 20 18   CREATININE 1.20 1.14 1.11 1.02  CALCIUM  --  8.4* 8.5* 8.7*  MG  --   --  2.2  --     Liver Function Tests:  Recent Labs Lab 06/12/16 1118 06/13/16 0312  AST 39  38 34  ALT 23  23 24   ALKPHOS 47  48 43  BILITOT 0.7  0.6 0.4  PROT 6.2*  6.2* 5.9*  ALBUMIN 2.5*  2.5* 2.5*   No results for input(s): LIPASE, AMYLASE in the last 168 hours.  Recent Labs Lab 06/12/16 1118  AMMONIA <9*   Coagulation Profile:  Recent Labs Lab 06/11/16 1219 06/12/16 1118 06/13/16 0312 06/14/16 0237 06/15/16 0310  INR 1.98 2.04 1.37 1.30 2.12   Cardiac Enzymes: No  results for input(s): CKTOTAL, CKMB, CKMBINDEX, TROPONINI in the last 168 hours. BNP (last 3 results) No results for input(s): PROBNP in the last 8760 hours.  CBG:  Recent Labs Lab 06/11/16 1011 06/14/16 0845  GLUCAP 86 147*    Studies: No results found.   Scheduled Meds: . aspirin  300 mg Rectal Daily   Or  . aspirin  325 mg Oral Daily  . cefTRIAXone (ROCEPHIN)  IV  2 g Intravenous Q12H  . predniSONE  10 mg Oral Q breakfast  . psyllium  1 packet Oral Daily  . QUEtiapine  25 mg Oral QHS  . rivaroxaban  20 mg Oral Daily  . sodium chloride flush  3 mL Intravenous Q12H   Continuous Infusions:  PRN Meds: acetaminophen **OR** acetaminophen, haloperidol lactate, ondansetron **OR** ondansetron (ZOFRAN) IV, ondansetron (ZOFRAN) IV, oxyCODONE, oxyCODONE-acetaminophen  Time spent: 30 minutes  Author: Berle Mull, MD [facility] Pager: [phone] 06/15/2016 4:05 PM  If 7PM-7AM, please contact night-coverage at [email or web address], password [facility]

## 2016-06-15 NOTE — Care Management Important Message (Signed)
Important Message  Patient Details  Name: Peter Bruce MRN: [id number] Date of Birth: 07/15/1950   Medicare Important Message Given:  Yes    Rumor Sun Abena 06/15/2016, 12:52 PM

## 2016-06-16 DIAGNOSIS — D6859 Other primary thrombophilia: Secondary | ICD-10-CM

## 2016-06-16 DIAGNOSIS — Z9889 Other specified postprocedural states: Secondary | ICD-10-CM

## 2016-06-16 DIAGNOSIS — I2690 Septic pulmonary embolism without acute cor pulmonale: Secondary | ICD-10-CM

## 2016-06-16 DIAGNOSIS — R410 Disorientation, unspecified: Secondary | ICD-10-CM

## 2016-06-16 DIAGNOSIS — R7881 Bacteremia: Secondary | ICD-10-CM

## 2016-06-16 LAB — CBC
HCT: 31.5 % — ABNORMAL LOW (ref 39.0–52.0)
Hemoglobin: 10.4 g/dL — ABNORMAL LOW (ref 13.0–17.0)
MCH: 30.9 pg (ref 26.0–34.0)
MCHC: 33 g/dL (ref 30.0–36.0)
MCV: 93.5 fL (ref 78.0–100.0)
PLATELETS: 115 10*3/uL — AB (ref 150–400)
RBC: 3.37 MIL/uL — AB (ref 4.22–5.81)
RDW: 14.1 % (ref 11.5–15.5)
WBC: 16.6 10*3/uL — AB (ref 4.0–10.5)

## 2016-06-16 LAB — BASIC METABOLIC PANEL
Anion gap: 9 (ref 5–15)
BUN: 16 mg/dL (ref 6–20)
CALCIUM: 8.7 mg/dL — AB (ref 8.9–10.3)
CO2: 29 mmol/L (ref 22–32)
Chloride: 100 mmol/L — ABNORMAL LOW (ref 101–111)
Creatinine, Ser: 1.19 mg/dL (ref 0.61–1.24)
GFR calc Af Amer: >60 mL/min (ref >60)
GLUCOSE: 101 mg/dL — AB (ref 65–99)
Potassium: 4.1 mmol/L (ref 3.5–5.1)
SODIUM: 138 mmol/L (ref 135–145)

## 2016-06-16 LAB — HEPATITIS B SURFACE ANTIGEN: HEP B S AG: NEGATIVE

## 2016-06-16 LAB — HCV COMMENT:

## 2016-06-16 LAB — HEPATITIS C ANTIBODY (REFLEX): HCV AB: 0.1 {s_co_ratio} (ref 0.0–0.9)

## 2016-06-16 MED ORDER — RIVAROXABAN 20 MG PO TABS
20.0000 mg | ORAL_TABLET | Freq: Every day | ORAL | Status: DC
  Administered 2016-06-16 – 2016-06-17 (×2): 20 mg via ORAL
  Filled 2016-06-16 (×2): qty 1

## 2016-06-16 MED ORDER — GUAIFENESIN-DM 100-10 MG/5ML PO SYRP
5.0000 mL | ORAL_SOLUTION | ORAL | Status: DC | PRN
  Administered 2016-06-16: 5 mL via ORAL
  Filled 2016-06-16: qty 5

## 2016-06-16 NOTE — Care Management Note (Signed)
Case Management Note  Patient Details  Name: Peter Bruce MRN: [id number] Date of Birth: 1951/01/21  Subjective/Objective:  Pt lives with wife who is his primary caregiver.  CM noted that pt has been started on Xarelto and, per wife, he has been on Xarelto prevously and qualified for the [employer].  She has the forms to complete to renew the Program and will take the application to Dr Irven Shelling office for his completion later today.  TC to alert representative, he will take samples to Dr Irven Shelling office so there will be no delay while application is being processed.                           Expected Discharge Plan:  [place]  Discharge planning Services  CM Consult, Medication Assistance  Peter Bruce, Kym Groom, [place] 06/16/2016, 10:21 AM

## 2016-06-16 NOTE — Progress Notes (Signed)
Progress Note    Peter Bruce  [id number] DOB: 03-Oct-1950  DOA: 06/11/2016 PCP: Adrian Prows, MD    Brief Narrative:   Chief complaint: F/U sepsis  Peter Bruce is an 66 y.o. 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 Currently further plan is continue IV antibiotics and further workup.  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 MRSA PCR was negative. 2 D Echocardiogram showed mitral valve vegetation measuring 11 mm x 19 mm. Initially was on ampicillin and vancomycin and acyclovir, all discontinued.Currently on IV ceftriaxone. ID following. Repeat culture currently not growing any organism.  Active Problems: History of pulmonary embolism/Paroxysmal atrial fibrillation (HCC) Was on Coumadin at home. Switched to [facility].  Small Multiple acute and subacute CVA. MRI brain moderate infarct in left temporal lobe, also multiple smaller infarct in inferior left occipital, right cerebellum, right cerebral, right caudate head, and bilateral parasagittal parietal cortex. Findings are concerning for embolic phenomenon, most likely septic emboli.Vascular Doppler unremarkable for any significant stenosis.Evaluated by neurologist. PT/OT with ultimate discharge to CIR when stable. Continue Xarelto and antibiotics. Speech therapy following.  Polymyalgia rheumatica--rheumatol [place]-2016 Worsening of the leukocytosis likely due to steroids. On chronic prednisone at home, changing to 10 mg prednisone here in the hospital. Received IV Decadron for suspected meningitis on  presentation.  Chronic diastolic CHF (congestive heart failure) (HCC) EF 50-55 percent.  Essential hypertension Currently holding home antihypertensive medications.  Hyponatremia Resolved.  Normocytic anemia/anemia of chronic disease Stable. Monitor.  Delirium Continue Seroquel and Haldol as needed.  Hypercoagulable state (HCC)/Lupus erythematosus  Coronary artery disease involving coronary bypass graft of native heart without angina pectoris Continue aspirin. Continue Lipitor.  Thrombocytopenia (HCC) Mild, platelet count 115,000 today.   Family Communication/Anticipated D/C date and plan/Code Status   DVT prophylaxis: Xarelto ordered. Code Status: Full Code.  Family Communication: No family currently at the bedside. Disposition Plan: CIR when stable.   Medical Consultants:    IR  ID  Neurology  Cardiology   Procedures:    None  Anti-Infectives:    Zosyn 06/11/16---> 06/11/16  Vancomycin 06/11/16---> 06/12/16  Ampicillin 06/11/16---> 06/12/16  Rocephin 06/11/16--->  Acyclovir 06/12/16---> 06/14/16  Subjective:   The patient says he feels tired and has soreness in his back. Denies shortness of breath. Reports his appetite is diminished and that his bowels moved yesterday. No nausea or vomiting.  Objective:    Vitals:   06/16/16 0200 06/16/16 0514 06/16/16 0700 06/16/16 0718  BP: (!) 122/92 127/79  (!) 137/93  Pulse: 78 86 85 94  Resp: 14 18 16 18   Temp:  97.8 F (36.6 C)  97.8 F (36.6 C)  TempSrc:  Oral  Oral  SpO2: 94% 96% 96% 98%  Weight:  74.1 kg (163 lb 5.8 oz)    Height:        Intake/Output Summary (Last 24 hours) at 06/16/16 0841 Last data filed at 06/16/16 0718  Gross per 24 hour  Intake              173 ml  Output             2525 ml  Net            -  2352 ml   Filed Weights   06/12/16 0639 06/14/16 0735 06/16/16 0514  Weight: 79.7 kg (175 lb 11.3 oz) 82 kg (180 lb 12.4 oz) 74.1 kg (163 lb 5.8 oz)    Exam: General exam: Appears  calm and Mildly sleepy.  Respiratory system: Clear to auscultation. Respiratory effort normal. Cardiovascular system: S1 & S2 heard, RRR. No JVD,  rubs, gallops or clicks. No murmurs. Gastrointestinal system: Abdomen is nondistended, soft and nontender. No organomegaly or masses felt. Normal bowel sounds heard. Central nervous system: Alert and oriented to self/place, but not month or day of the week. No focal neurological deficits. Extremities: No clubbing,  or cyanosis. No edema. Skin: No rashes, lesions or ulcers. Psychiatry: Judgement and insight appear impaired. Mood & affect flat.   Data Reviewed:   I have personally reviewed following labs and imaging studies:  Labs: Basic Metabolic Panel:  Recent Labs Lab 06/11/16 1933 06/12/16 1118 06/13/16 0312 06/15/16 0310 06/16/16 0344  NA 132* 137 136 140 138  K 4.4 4.3 4.5 3.8 4.1  CL 98* 104 105 103 100*  CO2  --  28 26 27 29   GLUCOSE 100* 183* 170* 118* 101*  BUN 21* 19 20 18 16   CREATININE 1.20 1.14 1.11 1.02 1.19  CALCIUM  --  8.4* 8.5* 8.7* 8.7*  MG  --   --  2.2  --   --    GFR Estimated Creatinine Clearance: 62.2 mL/min (by C-G formula based on SCr of 1.19 mg/dL). Liver Function Tests:  Recent Labs Lab 06/12/16 1118 06/13/16 0312  AST 39  38 34  ALT 23  23 24   ALKPHOS 47  48 43  BILITOT 0.7  0.6 0.4  PROT 6.2*  6.2* 5.9*  ALBUMIN 2.5*  2.5* 2.5*   No results for input(s): LIPASE, AMYLASE in the last 168 hours.  Recent Labs Lab 06/12/16 1118  AMMONIA <9*   Coagulation profile  Recent Labs Lab 06/11/16 1219 06/12/16 1118 06/13/16 0312 06/14/16 0237 06/15/16 0310  INR 1.98 2.04 1.37 1.30 2.12    CBC:  Recent Labs Lab 06/11/16 1035 06/11/16 1933 06/12/16 1118 06/13/16 0312 06/15/16 0310 06/16/16 0344  WBC 21.3*  --  16.2* 24.7* 18.8* 16.6*  NEUTROABS 19.2*  --  15.5* 23.6*  --   --   HGB 11.3* 10.9* 10.0* 9.7* 9.4* 10.4*  HCT 34.1* 32.0* 30.3* 29.0* 28.1* 31.5*  MCV 91.9  --   92.7 91.8 92.7 93.5  PLT 127*  --  91* 99* 106* 115*   Cardiac Enzymes: No results for input(s): CKTOTAL, CKMB, CKMBINDEX, TROPONINI in the last 168 hours. BNP (last 3 results) No results for input(s): PROBNP in the last 8760 hours. CBG:  Recent Labs Lab 06/11/16 1011 06/14/16 0845  GLUCAP 86 147*   D-Dimer: No results for input(s): DDIMER in the last 72 hours. Hgb A1c:  Recent Labs  06/14/16 0237  HGBA1C 5.7*   Lipid Profile:  Recent Labs  06/14/16 0235  CHOL 170  HDL 24*  LDLCALC 119*  TRIG 133  CHOLHDL 7.1   Thyroid function studies: No results for input(s): TSH, T4TOTAL, T3FREE, THYROIDAB in the last 72 hours.  Invalid input(s): FREET3 Anemia work up: No results for input(s): VITAMINB12, FOLATE, FERRITIN, TIBC, IRON, RETICCTPCT in the last 72 hours. Sepsis Labs:  Recent Labs Lab 06/11/16 1050 06/11/16 1454 06/11/16 2128 06/12/16 1118 06/13/16 0312 06/15/16 0310 06/16/16 0344  WBC  --   --   --  16.2* 24.7* 18.8*  16.6*  LATICACIDVEN 2.08* 1.01 1.22  --  1.0  --   --     Microbiology Recent Results (from the past 240 hour(s))  Culture, blood (Routine x 2)     Status: Abnormal   Collection Time: 06/11/16 10:40 AM  Result Value Ref Range Status   Specimen Description BLOOD RIGHT FOREARM  Final   Special Requests BOTTLES DRAWN AEROBIC AND ANAEROBIC 5CC  Final   Culture  Setup Time   Final    GRAM POSITIVE COCCI IN CHAINS IN BOTH AEROBIC AND ANAEROBIC BOTTLES CRITICAL RESULT CALLED TO, READ BACK BY AND VERIFIED WITH: E BARTON,PHARMD AT 0930 06/12/16 BY L BENFIELD    Culture VIRIDANS STREPTOCOCCUS (A)  Final   Report Status 06/14/2016 FINAL  Final   Organism ID, Bacteria VIRIDANS STREPTOCOCCUS  Final      Susceptibility   Viridans streptococcus - MIC*    PENICILLIN 0.12 SENSITIVE Sensitive     CEFTRIAXONE <=0.12 SENSITIVE Sensitive     ERYTHROMYCIN >=8 RESISTANT Resistant     LEVOFLOXACIN 1 SENSITIVE Sensitive     VANCOMYCIN 0.5 SENSITIVE  Sensitive     * VIRIDANS STREPTOCOCCUS  Blood Culture ID Panel (Reflexed)     Status: Abnormal   Collection Time: 06/11/16 10:40 AM  Result Value Ref Range Status   Enterococcus species NOT DETECTED NOT DETECTED Final   Listeria monocytogenes NOT DETECTED NOT DETECTED Final   Staphylococcus species NOT DETECTED NOT DETECTED Final   Staphylococcus aureus NOT DETECTED NOT DETECTED Final   Streptococcus species DETECTED (A) NOT DETECTED Final    Comment: Not Enterococcus species, Streptococcus agalactiae, Streptococcus pyogenes, or Streptococcus pneumoniae. CRITICAL RESULT CALLED TO, READ BACK BY AND VERIFIED WITH: E BARTON,PHARMD AT 0930 06/12/16 BY L BENFIELD    Streptococcus agalactiae NOT DETECTED NOT DETECTED Final   Streptococcus pneumoniae NOT DETECTED NOT DETECTED Final   Streptococcus pyogenes NOT DETECTED NOT DETECTED Final   Acinetobacter baumannii NOT DETECTED NOT DETECTED Final   Enterobacteriaceae species NOT DETECTED NOT DETECTED Final   Enterobacter cloacae complex NOT DETECTED NOT DETECTED Final   Escherichia coli NOT DETECTED NOT DETECTED Final   Klebsiella oxytoca NOT DETECTED NOT DETECTED Final   Klebsiella pneumoniae NOT DETECTED NOT DETECTED Final   Proteus species NOT DETECTED NOT DETECTED Final   Serratia marcescens NOT DETECTED NOT DETECTED Final   Haemophilus influenzae NOT DETECTED NOT DETECTED Final   Neisseria meningitidis NOT DETECTED NOT DETECTED Final   Pseudomonas aeruginosa NOT DETECTED NOT DETECTED Final   Candida albicans NOT DETECTED NOT DETECTED Final   Candida glabrata NOT DETECTED NOT DETECTED Final   Candida krusei NOT DETECTED NOT DETECTED Final   Candida parapsilosis NOT DETECTED NOT DETECTED Final   Candida tropicalis NOT DETECTED NOT DETECTED Final  Culture, blood (Routine x 2)     Status: Abnormal   Collection Time: 06/11/16 12:23 PM  Result Value Ref Range Status   Specimen Description BLOOD RIGHT ANTECUBITAL  Final   Special Requests  BOTTLES DRAWN AEROBIC AND ANAEROBIC 5CC  Final   Culture  Setup Time   Final    GRAM POSITIVE COCCI IN CHAINS IN BOTH AEROBIC AND ANAEROBIC BOTTLES CRITICAL RESULT CALLED TO, READ BACK BY AND VERIFIED WITH: E BARTON,PHARMD AT 0930 06/12/16 BY L BENFIELD    Culture (A)  Final    VIRIDANS STREPTOCOCCUS SUSCEPTIBILITIES PERFORMED ON PREVIOUS CULTURE WITHIN THE LAST 5 DAYS.    Report Status 06/14/2016 FINAL  Final  Urine culture  Status: Abnormal   Collection Time: 06/11/16  4:36 PM  Result Value Ref Range Status   Specimen Description URINE, RANDOM  Final   Special Requests NONE  Final   Culture MULTIPLE SPECIES PRESENT, SUGGEST RECOLLECTION (A)  Final   Report Status 06/12/2016 FINAL  Final  MRSA PCR Screening     Status: None   Collection Time: 06/12/16  6:40 AM  Result Value Ref Range Status   MRSA by PCR NEGATIVE NEGATIVE Final    Comment:        The GeneXpert MRSA Assay (FDA approved for NASAL specimens only), is one component of a comprehensive MRSA colonization surveillance program. It is not intended to diagnose MRSA infection nor to guide or monitor treatment for MRSA infections.   Culture, blood (routine x 2)     Status: None (Preliminary result)   Collection Time: 06/14/16  2:35 AM  Result Value Ref Range Status   Specimen Description BLOOD LEFT ANTECUBITAL  Final   Special Requests BOTTLES DRAWN AEROBIC AND ANAEROBIC 6CC EA  Final   Culture NO GROWTH 1 DAY  Final   Report Status PENDING  Incomplete  Culture, blood (routine x 2)     Status: None (Preliminary result)   Collection Time: 06/14/16  2:42 AM  Result Value Ref Range Status   Specimen Description BLOOD BLOOD RIGHT HAND  Final   Special Requests BOTTLES DRAWN AEROBIC AND ANAEROBIC  5CC EA  Final   Culture NO GROWTH 1 DAY  Final   Report Status PENDING  Incomplete    Radiology: No results found.  Medications:   . aspirin  300 mg Rectal Daily   Or  . aspirin  325 mg Oral Daily  . atorvastatin   40 mg Oral q1800  . cefTRIAXone (ROCEPHIN)  IV  2 g Intravenous Q12H  . predniSONE  10 mg Oral Q breakfast  . psyllium  1 packet Oral Daily  . QUEtiapine  25 mg Oral QHS  . rivaroxaban  20 mg Oral Daily  . sodium chloride flush  3 mL Intravenous Q12H   Continuous Infusions:  Medical decision making is of high complexity and this patient is at high risk of deterioration, therefore this is a level 3 visit.  (> 4 problem points, >4 data points, high risk)   LOS: 5 days   RAMA,CHRISTINA  [facility] Pager [phone]. If unable to reach me by pager, please call my cell phone at [phone].  *Please refer to [email or web address], password [email or web address] to get updated schedule on who will round on this patient, as hospitalists switch teams weekly. If 7PM-7AM, please contact night-coverage at [email or web address], password [email or web address] for any overnight needs.  06/16/2016, 8:41 AM

## 2016-06-16 NOTE — Care Management Note (Signed)
Case Management Note  Patient Details  Name: Peter Bruce MRN: [id number] Date of Birth: Jun 07, 1950  Subjective/Objective:  CM had a lengthy conversation with pt and wife, decision has been made that pt will go home with home health RN and PT - pt will need PICC for IV antibx.  Provided list of home health agencies and referral made to [facility] per choice.                          Expected Discharge Plan:  [place]  Discharge planning Services  CM Consult, Medication Assistance  Post Acute Care Choice:  Durable Medical Equipment, Home Health Choice offered to:  Spouse  DME Arranged:  IV pump/equipment DME Agency:  [place] Arranged:  RN, PT [facility] Agency:  [place]  Status of Service:  In process, will continue to follow  Girard Cooter, RN 06/16/2016, 3:14 PM

## 2016-06-16 NOTE — Progress Notes (Signed)
Subjective:  Feels much better, no chest pain or shortness of breath. Appears less confused. Wants to go home. Wife communicated to me that it is the best option.  Objective:  Vital Signs in the last 24 hours: Temp:  [97.3 F (36.3 C)-97.9 F (36.6 C)] 97.8 F (36.6 C) (02/07 1900) Pulse Rate:  [78-100] 100 (02/07 1900) Resp:  [9-24] 19 (02/07 1900) BP: (104-147)/(73-104) 137/80 (02/07 1900) SpO2:  [94 %-98 %] 96 % (02/07 1900) Weight:  [163 lb 5.8 oz (74.1 kg)] 163 lb 5.8 oz (74.1 kg) (02/07 0514)  Intake/Output from previous day: 02/06 0701 - 02/07 0700 In: 173 [P.O.:120; I.V.:3; IV Piggyback:50] Out: 2600 [Urine:2600]  Physical Exam: General appearance: alert, cooperative, appears stated age and no distress Lungs: clear to auscultation bilaterally Heart: S1-S2 is normal. There is a mid diastolic murmur at the apex. Abdomen: soft, non-tender; bowel sounds normal; no masses,  no organomegaly Extremities: extremities normal, atraumatic, no cyanosis or edema Pulses: 2+ and symmetric Neurologic: Grossly normal  Lab Results: BMP  Recent Labs  06/13/16 0312 06/15/16 0310 06/16/16 0344  NA 136 140 138  K 4.5 3.8 4.1  CL 105 103 100*  CO2 26 27 29   GLUCOSE 170* 118* 101*  BUN 20 18 16   CREATININE 1.11 1.02 1.19  CALCIUM 8.5* 8.7* 8.7*  GFRNONAA >60 >60 >60  GFRAA >60 >60 >60    CBC  Recent Labs Lab 06/13/16 0312  06/16/16 0344  WBC 24.7*  < > 16.6*  RBC 3.16*  < > 3.37*  HGB 9.7*  < > 10.4*  HCT 29.0*  < > 31.5*  PLT 99*  < > 115*  MCV 91.8  < > 93.5  MCH 30.7  < > 30.9  MCHC 33.4  < > 33.0  RDW 13.5  < > 14.1  LYMPHSABS 0.6*  --   --   MONOABS 0.5  --   --   EOSABS 0.0  --   --   BASOSABS 0.0  --   --   < > = values in this interval not displayed.  HEMOGLOBIN A1C Lab Results  Component Value Date   HGBA1C 5.7 (H) 06/14/2016   MPG 117 06/14/2016    Recent Labs  09/19/15 1139 06/12/16 1118  TSH 1.98 1.071   Lipid Panel     Component Value  Date/Time   CHOL 170 06/14/2016 0235   TRIG 133 06/14/2016 0235   HDL 24 (L) 06/14/2016 0235   CHOLHDL 7.1 06/14/2016 0235   VLDL 27 06/14/2016 0235   LDLCALC 119 (H) 06/14/2016 0235     Recent Labs  11/19/15 1340 06/12/16 1118 06/13/16 0312  PROT 6.6 6.2*  6.2* 5.9*  ALBUMIN 3.5 2.5*  2.5* 2.5*  AST 25 39  38 34  ALT 25 23  23 24   ALKPHOS 50 47  48 64  BILITOT 0.9 0.7  0.6 0.4  BILIDIR  --  0.2  --   IBILI  --  0.5  --     Imaging: Imaging results have been reviewed  Cardiac Studies:  EKG 06/11/2016: Normal sinus rhythm/sinus tachycardia at the rate of 100 bpm, normal axis. No evidence of ischemia, normal EKG. PVC.  Echo: 06/13/2016: Normal LV systolic function. Mitral valve, especially the anterior mitral leaflet is severely thickened and appears myxomatous with suspicion for vegetation, Measures approximately 1.5-2 cm x 1 cm. The vegetation is fixed. Posterior mitral leaflet is restricted in movement. There is moderate to severe mitral stenosis with  a mean gradient of 22 mmHg, mitral valve area of 1.15 cm. Left atrium was moderate to severely dilated, moderate pulmonary hypertension, PA pressure 45 mmHg.  Assessment/Plan:  1. 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 2. History of minimally invasive mitral valve repair on 11/21/2015 3. Atherosclerosis of native coronary artery of native heart with angina pectoris S/P CABG x 3 on 05/29/2015 withLIMA to LAD, SVG to OM2, SVG to RCA; and Maze procedure and clipping of left atrial appendage. 4. Paroxysmal atrial fibrillation and atrial flutter, S/P Maze procedure and left atrial appendage closure during CABG, presently on Xarelto. Continue the same. CHA2DS2-VASCScore: Risk Score 4.0 Yearly risk of stroke 4%  5. Hypercoagulable state, primary. On long-term anticoagulation with Coumadin. 6. Hypertension 7. Hyperlipidemia 8. 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  Recommendation: No MR murmur on physical exam. No clinical e.o CHF. Continued antibiotics per ID recommendations. He is stable from cardiac standpoint to be discharged home as he is very anxious and wife prefers him home and understands implications of gait difficulty and possible fall risk.   PICC line for IV antibiotics and d/c home in AM.  I have discussed with [facility] [place] about his presentation and he agrees on medical therapy for now.  Adrian Prows, M.D. 06/16/2016, 9:38 PM [facility] Pager: [phone] Office: [phone] If no answer: [phone]

## 2016-06-16 NOTE — Progress Notes (Addendum)
Discussed with RN CM. Pt and spouse have decided to go home with [facility] due to pt's cognition per RN CM. Please contact me if wife would like to discuss CIR admission possibility. [phone]

## 2016-06-16 NOTE — Progress Notes (Addendum)
Subjective: This morning, he was sitting up in bed and appeared better. He was interested in going home, and I told him that while he was improving, we still had some ways to go. He denied any complaints.  Antibiotics:  Anti-infectives    Start     Dose/Rate Route Frequency Ordered Stop   06/12/16 1100  vancomycin (VANCOCIN) IVPB 1000 mg/200 mL premix  Status:  Discontinued     1,000 mg 200 mL/hr over 60 Minutes Intravenous Every 12 hours 06/11/16 2214 06/12/16 0942   06/12/16 0300  ampicillin (OMNIPEN) 2 g in sodium chloride 0.9 % 50 mL IVPB  Status:  Discontinued     2 g 150 mL/hr over 20 Minutes Intravenous Every 4 hours 06/11/16 2214 06/12/16 0942   06/12/16 0045  acyclovir (ZOVIRAX) 770 mg in dextrose 5 % 150 mL IVPB  Status:  Discontinued     10 mg/kg  77.1 kg 165.4 mL/hr over 60 Minutes Intravenous Every 8 hours 06/12/16 0023 06/14/16 0902   06/11/16 2215  cefTRIAXone (ROCEPHIN) 2 g in dextrose 5 % 50 mL IVPB     2 g 100 mL/hr over 30 Minutes Intravenous Every 12 hours 06/11/16 2204     06/11/16 2215  cefTRIAXone (ROCEPHIN) 2 g in dextrose 5 % 50 mL IVPB  Status:  Discontinued     2 g 100 mL/hr over 30 Minutes Intravenous  Once 06/11/16 2206 06/11/16 2207   06/11/16 2215  vancomycin (VANCOCIN) IVPB 1000 mg/200 mL premix  Status:  Discontinued     1,000 mg 200 mL/hr over 60 Minutes Intravenous  Once 06/11/16 2206 06/11/16 2207   06/11/16 2215  ampicillin (OMNIPEN) 2 g in sodium chloride 0.9 % 50 mL IVPB     2 g 150 mL/hr over 20 Minutes Intravenous  Once 06/11/16 2206 06/11/16 2325   06/11/16 2215  vancomycin (VANCOCIN) 1,500 mg in sodium chloride 0.9 % 500 mL IVPB     1,500 mg 250 mL/hr over 120 Minutes Intravenous  Once 06/11/16 2209 06/12/16 0121   06/11/16 2115  vancomycin (VANCOCIN) IVPB 1000 mg/200 mL premix  Status:  Discontinued     1,000 mg 200 mL/hr over 60 Minutes Intravenous  Once 06/11/16 2110 06/11/16 2209   06/11/16 2115  piperacillin-tazobactam  (ZOSYN) IVPB 3.375 g  Status:  Discontinued     3.375 g 12.5 mL/hr over 240 Minutes Intravenous  Once 06/11/16 2110 06/11/16 2209      Medications: Scheduled Meds: . aspirin  300 mg Rectal Daily   Or  . aspirin  325 mg Oral Daily  . atorvastatin  40 mg Oral q1800  . cefTRIAXone (ROCEPHIN)  IV  2 g Intravenous Q12H  . predniSONE  10 mg Oral Q breakfast  . psyllium  1 packet Oral Daily  . QUEtiapine  25 mg Oral QHS  . rivaroxaban  20 mg Oral Q supper  . sodium chloride flush  3 mL Intravenous Q12H   Continuous Infusions: PRN Meds:.acetaminophen **OR** acetaminophen, haloperidol lactate, ondansetron **OR** ondansetron (ZOFRAN) IV, ondansetron (ZOFRAN) IV, oxyCODONE, oxyCODONE-acetaminophen    Objective: Weight change: -17 lb 6.7 oz (-7.9 kg)  Intake/Output Summary (Last 24 hours) at 06/16/16 1700 Last data filed at 06/16/16 1323  Gross per 24 hour  Intake              480 ml  Output             2750 ml  Net            -  2270 ml   Blood pressure (!) 137/104, pulse 98, temperature 97.9 F (36.6 C), temperature source Oral, resp. rate 20, height 5\' 10"  (1.778 m), weight 163 lb 5.8 oz (74.1 kg), SpO2 97 %. Temp:  [97.8 F (36.6 C)-98.5 F (36.9 C)] 97.9 F (36.6 C) (02/07 1201) Pulse Rate:  [78-98] 98 (02/07 0900) Resp:  [9-24] 20 (02/07 1600) BP: (104-147)/(73-104) 137/104 (02/07 1600) SpO2:  [94 %-98 %] 97 % (02/07 0900) Weight:  [163 lb 5.8 oz (74.1 kg)] 163 lb 5.8 oz (74.1 kg) (02/07 0514)  Physical Exam: General: middle-aged male, sitting up in bed HEENT: PERRL, EOMI, no scleral icterus, oropharynx clear Cardiac: RRR, diastolic murmur best appreciated at the left lower sternal border Pulm: clear to auscultation bilaterally, no wheezes, rales, or rhonchi Abd: soft, nontender, nondistended, BS present Ext: warm and well perfused, no pedal edema Neuro: oriented to name and place but not year [2020]; moving all extremities freely  BMET  Recent Labs  06/15/16 0310  06/16/16 0344  NA 140 138  K 3.8 4.1  CL 103 100*  CO2 27 29  GLUCOSE 118* 101*  BUN 18 16  CREATININE 1.02 1.19  CALCIUM 8.7* 8.7*     Liver Panel  No results for input(s): PROT, ALBUMIN, AST, ALT, ALKPHOS, BILITOT, BILIDIR, IBILI in the last 72 hours.     Sedimentation Rate No results for input(s): ESRSEDRATE in the last 72 hours. C-Reactive Protein No results for input(s): CRP in the last 72 hours.  Micro Results: Recent Results (from the past 720 hour(s))  Culture, blood (Routine x 2)     Status: Abnormal   Collection Time: 06/11/16 10:40 AM  Result Value Ref Range Status   Specimen Description BLOOD RIGHT FOREARM  Final   Special Requests BOTTLES DRAWN AEROBIC AND ANAEROBIC 5CC  Final   Culture  Setup Time   Final    GRAM POSITIVE COCCI IN CHAINS IN BOTH AEROBIC AND ANAEROBIC BOTTLES CRITICAL RESULT CALLED TO, READ BACK BY AND VERIFIED WITH: E BARTON,PHARMD AT 0930 06/12/16 BY L BENFIELD    Culture VIRIDANS STREPTOCOCCUS (A)  Final   Report Status 06/14/2016 FINAL  Final   Organism ID, Bacteria VIRIDANS STREPTOCOCCUS  Final      Susceptibility   Viridans streptococcus - MIC*    PENICILLIN 0.12 SENSITIVE Sensitive     CEFTRIAXONE <=0.12 SENSITIVE Sensitive     ERYTHROMYCIN >=8 RESISTANT Resistant     LEVOFLOXACIN 1 SENSITIVE Sensitive     VANCOMYCIN 0.5 SENSITIVE Sensitive     * VIRIDANS STREPTOCOCCUS  Blood Culture ID Panel (Reflexed)     Status: Abnormal   Collection Time: 06/11/16 10:40 AM  Result Value Ref Range Status   Enterococcus species NOT DETECTED NOT DETECTED Final   Listeria monocytogenes NOT DETECTED NOT DETECTED Final   Staphylococcus species NOT DETECTED NOT DETECTED Final   Staphylococcus aureus NOT DETECTED NOT DETECTED Final   Streptococcus species DETECTED (A) NOT DETECTED Final    Comment: Not Enterococcus species, Streptococcus agalactiae, Streptococcus pyogenes, or Streptococcus pneumoniae. CRITICAL RESULT CALLED TO, READ BACK BY AND  VERIFIED WITH: E BARTON,PHARMD AT 0930 06/12/16 BY L BENFIELD    Streptococcus agalactiae NOT DETECTED NOT DETECTED Final   Streptococcus pneumoniae NOT DETECTED NOT DETECTED Final   Streptococcus pyogenes NOT DETECTED NOT DETECTED Final   Acinetobacter baumannii NOT DETECTED NOT DETECTED Final   Enterobacteriaceae species NOT DETECTED NOT DETECTED Final   Enterobacter cloacae complex NOT DETECTED NOT DETECTED Final   Escherichia  coli NOT DETECTED NOT DETECTED Final   Klebsiella oxytoca NOT DETECTED NOT DETECTED Final   Klebsiella pneumoniae NOT DETECTED NOT DETECTED Final   Proteus species NOT DETECTED NOT DETECTED Final   Serratia marcescens NOT DETECTED NOT DETECTED Final   Haemophilus influenzae NOT DETECTED NOT DETECTED Final   Neisseria meningitidis NOT DETECTED NOT DETECTED Final   Pseudomonas aeruginosa NOT DETECTED NOT DETECTED Final   Candida albicans NOT DETECTED NOT DETECTED Final   Candida glabrata NOT DETECTED NOT DETECTED Final   Candida krusei NOT DETECTED NOT DETECTED Final   Candida parapsilosis NOT DETECTED NOT DETECTED Final   Candida tropicalis NOT DETECTED NOT DETECTED Final  Culture, blood (Routine x 2)     Status: Abnormal   Collection Time: 06/11/16 12:23 PM  Result Value Ref Range Status   Specimen Description BLOOD RIGHT ANTECUBITAL  Final   Special Requests BOTTLES DRAWN AEROBIC AND ANAEROBIC 5CC  Final   Culture  Setup Time   Final    GRAM POSITIVE COCCI IN CHAINS IN BOTH AEROBIC AND ANAEROBIC BOTTLES CRITICAL RESULT CALLED TO, READ BACK BY AND VERIFIED WITH: E BARTON,PHARMD AT 0930 06/12/16 BY L BENFIELD    Culture (A)  Final    VIRIDANS STREPTOCOCCUS SUSCEPTIBILITIES PERFORMED ON PREVIOUS CULTURE WITHIN THE LAST 5 DAYS.    Report Status 06/14/2016 FINAL  Final  Urine culture     Status: Abnormal   Collection Time: 06/11/16  4:36 PM  Result Value Ref Range Status   Specimen Description URINE, RANDOM  Final   Special Requests NONE  Final   Culture  MULTIPLE SPECIES PRESENT, SUGGEST RECOLLECTION (A)  Final   Report Status 06/12/2016 FINAL  Final  MRSA PCR Screening     Status: None   Collection Time: 06/12/16  6:40 AM  Result Value Ref Range Status   MRSA by PCR NEGATIVE NEGATIVE Final    Comment:        The GeneXpert MRSA Assay (FDA approved for NASAL specimens only), is one component of a comprehensive MRSA colonization surveillance program. It is not intended to diagnose MRSA infection nor to guide or monitor treatment for MRSA infections.   Culture, blood (routine x 2)     Status: None (Preliminary result)   Collection Time: 06/14/16  2:35 AM  Result Value Ref Range Status   Specimen Description BLOOD LEFT ANTECUBITAL  Final   Special Requests BOTTLES DRAWN AEROBIC AND ANAEROBIC 6CC EA  Final   Culture NO GROWTH 2 DAYS  Final   Report Status PENDING  Incomplete  Culture, blood (routine x 2)     Status: None (Preliminary result)   Collection Time: 06/14/16  2:42 AM  Result Value Ref Range Status   Specimen Description BLOOD BLOOD RIGHT HAND  Final   Special Requests BOTTLES DRAWN AEROBIC AND ANAEROBIC  5CC EA  Final   Culture NO GROWTH 2 DAYS  Final   Report Status PENDING  Incomplete    Studies/Results: No results found.    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  Strep viridans endocarditis: Per conversations with the lab, repeat sensitivities resulted exactly as prior, so we assume penicillin MIC is 0.12.  -Continue ceftriaxone [Abx Day 6]  Acute/subacute CVA: Suspect septic emboli though cannot exclude underlying hypercoaguable state and paroxysmal a-fib/a-flutter. -Continue Xarelto  Polymyalgia rheumatica on chronic steroid therapy: Continue prednisone 10 mg at breakfast.   LOS: 5 days   Charlott Rakes 06/16/2016, 5:00 PM

## 2016-06-17 DIAGNOSIS — G9340 Encephalopathy, unspecified: Secondary | ICD-10-CM

## 2016-06-17 LAB — CBC
HEMATOCRIT: 34.4 % — AB (ref 39.0–52.0)
Hemoglobin: 11.7 g/dL — ABNORMAL LOW (ref 13.0–17.0)
MCH: 31.5 pg (ref 26.0–34.0)
MCHC: 34 g/dL (ref 30.0–36.0)
MCV: 92.7 fL (ref 78.0–100.0)
PLATELETS: 131 10*3/uL — AB (ref 150–400)
RBC: 3.71 MIL/uL — AB (ref 4.22–5.81)
RDW: 13.8 % (ref 11.5–15.5)
WBC: 19.6 10*3/uL — AB (ref 4.0–10.5)

## 2016-06-17 LAB — BASIC METABOLIC PANEL
ANION GAP: 11 (ref 5–15)
BUN: 17 mg/dL (ref 6–20)
CO2: 27 mmol/L (ref 22–32)
Calcium: 8.8 mg/dL — ABNORMAL LOW (ref 8.9–10.3)
Chloride: 97 mmol/L — ABNORMAL LOW (ref 101–111)
Creatinine, Ser: 0.99 mg/dL (ref 0.61–1.24)
GFR calc Af Amer: >60 mL/min (ref >60)
GLUCOSE: 94 mg/dL (ref 65–99)
POTASSIUM: 3.2 mmol/L — AB (ref 3.5–5.1)
Sodium: 135 mmol/L (ref 135–145)

## 2016-06-17 MED ORDER — QUETIAPINE FUMARATE 25 MG PO TABS: 25.0000 mg | ORAL_TABLET | Freq: Every day | ORAL | 2 refills | Status: DC

## 2016-06-17 MED ORDER — CEFTRIAXONE IV (FOR PTA / DISCHARGE USE ONLY): 2.0000 g | Freq: Two times a day (BID) | INTRAVENOUS | 0 refills | Status: DC

## 2016-06-17 MED ORDER — ASPIRIN 325 MG PO TABS: 325.0000 mg | ORAL_TABLET | Freq: Every day | ORAL | Status: DC

## 2016-06-17 MED ORDER — RIVAROXABAN 20 MG PO TABS: 20.0000 mg | ORAL_TABLET | Freq: Every day | ORAL | 2 refills | Status: DC

## 2016-06-17 MED ORDER — POTASSIUM CHLORIDE CRYS ER 20 MEQ PO TBCR
40.0000 meq | EXTENDED_RELEASE_TABLET | Freq: Once | ORAL | Status: AC
Start: 2016-06-17 — End: 2016-06-17
  Administered 2016-06-17: 40 meq via ORAL
  Filled 2016-06-17: qty 2

## 2016-06-17 MED ORDER — STROKE: EARLY STAGES OF RECOVERY BOOK
Freq: Once | Status: AC
  Administered 2016-06-17: 16:00:00
  Filled 2016-06-17: qty 1

## 2016-06-17 MED ORDER — PREDNISONE 10 MG PO TABS: 5.0000 mg | ORAL_TABLET | Freq: Every day | ORAL | 0 refills | Status: DC

## 2016-06-17 MED ORDER — PREDNISONE 10 MG PO TABS: 10.0000 mg | ORAL_TABLET | Freq: Every day | ORAL | 0 refills | Status: DC

## 2016-06-17 MED ORDER — HEPARIN SOD (PORK) LOCK FLUSH 100 UNIT/ML IV SOLN
250.0000 [IU] | INTRAVENOUS | Status: AC | PRN
  Administered 2016-06-17: 250 [IU]

## 2016-06-17 MED ORDER — SODIUM CHLORIDE 0.9% FLUSH: 10.0000 mL | INTRAVENOUS | Status: DC | PRN

## 2016-06-17 MED ORDER — ATORVASTATIN CALCIUM 40 MG PO TABS: 40.0000 mg | ORAL_TABLET | Freq: Every day | ORAL | Status: DC

## 2016-06-17 NOTE — Progress Notes (Signed)
[facility]  Pt is new for [facility] this hospital admission  [facility] will provide [facility] services as ordered as well as [facility] services for home IVABX upon DC.  [facility] Infusion Coordinator met with pt and wife last p.m. And discussed POC for home.  Wife agreeable and capable to learn IV ABX administration.  [facility] will follow while inpatient to support transition home when ordered.  If patient discharges after hours, please call [phone].   Larry Sierras 06/17/2016, 11:36 AM

## 2016-06-17 NOTE — Progress Notes (Signed)
Peripherally Inserted Central Catheter/Midline Placement  The IV Nurse has discussed with the patient and/or persons authorized to consent for the patient, the purpose of this procedure and the potential benefits and risks involved with this procedure.  The benefits include less needle sticks, lab draws from the catheter, and the patient may be discharged home with the catheter. Risks include, but not limited to, infection, bleeding, blood clot (thrombus formation), and puncture of an artery; nerve damage and irregular heartbeat and possibility to perform a PICC exchange if needed/ordered by physician.  Alternatives to this procedure were also discussed.  [facility] PICC patient education guide, fact sheet on infection prevention and patient information card has been provided to patient /or left at bedside.    PICC/Midline Placement Documentation  PICC Single Lumen 06/17/16 PICC Right Brachial 42 cm 0 cm (Active)  Indication for Insertion or Continuance of Line Home intravenous therapies (PICC only) 06/17/2016  1:09 PM  Exposed Catheter (cm) 0 cm 06/17/2016  1:09 PM  Site Assessment Clean;Dry;Intact 06/17/2016  1:09 PM  Line Status Flushed;Saline locked;Blood return noted 06/17/2016  1:09 PM  Dressing Type Transparent;Securing device 06/17/2016  1:09 PM  Dressing Change Due 06/24/16 06/17/2016  1:09 PM   Consent signed by wife at bedside per patient request    Peter Bruce 06/17/2016, 1:29 PM

## 2016-06-17 NOTE — Progress Notes (Signed)
Nsg Discharge Note  Admit Date:  06/11/2016 Discharge date: 06/17/2016   Edgel Degnan to be D/C'd Home per MD order.  AVS completed.  Copy for chart, and copy for patient signed, and dated. Patient/caregiver able to verbalize understanding.  Discharge Medication: Allergies as of 06/17/2016      Reactions   Amitriptyline Shortness Of Breath   Serotonin Reuptake Inhibitors (ssris) Nausea And Vomiting   SSRI uptake makes him extremely sick.      Medication List    STOP taking these medications   warfarin 7.5 MG tablet Commonly known as:  COUMADIN     TAKE these medications   acetaminophen 500 MG tablet Commonly known as:  TYLENOL Take 1,000 mg by mouth every 8 (eight) hours as needed for mild pain or moderate pain.   amLODipine 10 MG tablet Commonly known as:  NORVASC Take 10 mg by mouth daily.   aspirin 325 MG tablet Take 1 tablet (325 mg total) by mouth daily. Start taking on:  06/18/2016   atorvastatin 40 MG tablet Commonly known as:  LIPITOR Take 1 tablet (40 mg total) by mouth daily at 6 PM.   cefTRIAXone IVPB Commonly known as:  ROCEPHIN Inject 2 g into the vein every 12 (twelve) hours. Indication:  Endocarditis + Bacteremia Last Day of Therapy:  07/25/16 Labs - Once weekly:  CBC/D and BMP, Labs - Every other week:  ESR and CRP   metoprolol succinate 25 MG 24 hr tablet Commonly known as:  TOPROL-XL Take 0.5 tablets (12.5 mg total) by mouth daily.   multivitamin tablet Take 1 tablet by mouth daily.   predniSONE 10 MG tablet Commonly known as:  DELTASONE Take 1 tablet (10 mg total) by mouth daily. What changed:  medication strength  how much to take   QUEtiapine 25 MG tablet Commonly known as:  SEROQUEL Take 1 tablet (25 mg total) by mouth at bedtime.   rivaroxaban 20 MG Tabs tablet Commonly known as:  XARELTO Take 1 tablet (20 mg total) by mouth daily with supper.   valsartan 160 MG tablet Commonly known as:  DIOVAN Take 1 tablet (160 mg total) by mouth  daily.   vitamin C 100 MG tablet Take 100 mg by mouth daily.   zolpidem 10 MG tablet Commonly known as:  AMBIEN Take 10 mg by mouth daily.            Home Infusion Instuctions        Start     Ordered   06/17/16 0000  Home infusion instructions [facility] May follow [place] Dosing Protocol; May administer Cathflo as needed to maintain patency of vascular access device.; Flushing of vascular access device: per [facility] Protocol: 0.9% NaCl pre/post medica...    Question Answer Comment  Instructions May follow [place] Dosing Protocol   Instructions May administer Cathflo as needed to maintain patency of vascular access device.   Instructions Flushing of vascular access device: per [facility] Protocol: 0.9% NaCl pre/post medication administration and prn patency; Heparin 100 u/ml, 76m for implanted ports and Heparin 10u/ml, 553mfor all other central venous catheters.   Instructions May follow [facility] Anaphylaxis Protocol for First Dose Administration in the home: 0.9% NaCl at 25-50 ml/hr to maintain IV access for protocol meds. Epinephrine 0.3 ml IV/IM PRN and Benadryl 25-50 IV/IM PRN s/s of anaphylaxis.   Instructions [facility] Infusion Coordinator (RN) to assist per patient IV care needs in the home PRN.  06/17/16 [place]        Start     Ordered   06/17/16 1408  For home use only DME Walker rolling  Once    Question:  Patient needs a walker to treat with the following condition  Answer:  Weakness generalized   06/17/16 1407      Discharge Assessment: Vitals:   06/16/16 2157 06/17/16 0538  BP: (!) 138/92 (!) 147/97  Pulse: 87 90  Resp: 18 18  Temp: 98 F (36.7 C) 98.1 F (36.7 C)   Skin clean, dry and intact without evidence of skin break down, no evidence of skin tears noted. PICC intact. Will use for home antibiotics   D/c Instructions-Education: Discharge instructions given to patient/family with verbalized  understanding. D/c education completed with patient/family including follow up instructions, medication list, d/c activities limitations if indicated, with other d/c instructions as indicated by MD - patient able to verbalize understanding, all questions fully answered. Patient instructed to return to ED, call 911, or call MD for any changes in condition.  Patient escorted via [place], and D/C home via private auto.  Demarie Uhlig Margaretha Sheffield, RN 06/17/2016 5:24 PM

## 2016-06-17 NOTE — Progress Notes (Signed)
Physical Therapy Treatment Patient Details Name: Peter Bruce MRN: [id number] DOB: 1951-04-08 Today's Date: 06/17/2016    History of Present Illness Patient is a 66 yo male admitted 06/11/16 with fever and confusion, and LE weakness.  Patient was found to be septic, and encephalopathic. 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  Per chart, poss embolic infective endocarditis.     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    PT Comments    Pt able to increase gait distance with improved gait quality as well today. D/c plan has changed to home with wife and HHPT.  Discussed home safety and managing stairs to enter home.  Pt will need a RW for home.  Follow Up Recommendations  Home health PT;Supervision/Assistance - 24 hour     Equipment Recommendations  Rolling walker with 5" wheels    Recommendations for Other Services       Precautions / Restrictions Precautions Precautions: Fall Precaution Comments: Recent falls at home.  Confusion. Restrictions Weight Bearing Restrictions: No    Mobility  Bed Mobility Overal bed mobility: Needs Assistance Bed Mobility: Rolling;Sidelying to Sit Rolling: Supervision Sidelying to sit: Supervision   Sit to supine: Min assist   General bed mobility comments: Heavy use of bed rail with supine > sit.  A for LE to get legs up onto bed with return to supine  Transfers Overall transfer level: Needs assistance Equipment used: Rolling walker (2 wheeled) Transfers: Sit to/from Stand Sit to Stand: Min guard         General transfer comment: cues for hand placement  Ambulation/Gait Ambulation/Gait assistance: Min guard Ambulation Distance (Feet): 135 Feet Assistive device: Rolling walker (2 wheeled) Gait Pattern/deviations: Decreased step length - right;Decreased step  length - left;Trunk flexed Gait velocity: decreased   General Gait Details: Improved gait sequencing today with cues for posture. Pt able to locate room with identifying room number   Stairs            Wheelchair Mobility    Modified Rankin (Stroke Patients Only)       Balance     Sitting balance-Leahy Scale: Good     Standing balance support: During functional activity;No upper extremity supported Standing balance-Leahy Scale: Fair                      Cognition Arousal/Alertness: Awake/alert Behavior During Therapy: Flat affect Overall Cognitive Status: Impaired/Different from baseline         Following Commands: Follows one step commands consistently;Follows one step commands with increased time;Follows multi-step commands inconsistently Safety/Judgement: Decreased awareness of safety;Decreased awareness of deficits   Problem Solving: Slow processing      Exercises      General Comments General comments (skin integrity, edema, etc.): discussed home set up and stairs with wife.       Pertinent Vitals/Pain Pain Assessment: No/denies pain    Home Living                      Prior Function            PT Goals (current goals can now be found in the care plan section) Acute Rehab PT Goals PT Goal Formulation: With family Time For Goal Achievement: 06/20/16 Potential to Achieve Goals: Good Progress towards PT  goals: Progressing toward goals    Frequency    Min 3X/week      PT Plan Discharge plan needs to be updated;Equipment recommendations need to be updated    Co-evaluation PT/OT/SLP Co-Evaluation/Treatment: Yes Reason for Co-Treatment: For patient/therapist safety PT goals addressed during session: Mobility/safety with mobility;Balance;Proper use of DME       End of Session Equipment Utilized During Treatment: Gait belt Activity Tolerance: Patient tolerated treatment well Patient left: in bed;with call bell/phone  within reach;with bed alarm set;Other (comment);with nursing/sitter in room;with family/visitor present (IV team)     Time: [id number] PT Time Calculation (min) (ACUTE ONLY): 30 min  Charges:  $Gait Training: 8-22 mins                    G Codes:      Purvis Sidle LUBECK 06/17/2016, 1:25 PM

## 2016-06-17 NOTE — Consult Note (Signed)
[facility] CM Primary Care Navigator  06/17/2016  Peter Bruce 1951-01-19 [id number]  Met with patient and wife Peter Bruce) at the bedside to identify possible discharge needs. Patient had fever, loss of appetite, marked weakness and confusion that had led to this admission.  Patient's wife reports that Dr. Adrian Prows with [facility] is currently whom patient is seeing since his primary provider had retired. Wife states that Dr. Einar Gip mentioned about referring patient to a primary care physician whom he can follow-up with.   Patient and wife shared using [employer] [phone]) to obtain medications without any problem.   Patient manages and organizes his own medications at home per wife.  Wife provides transportation to his doctors' appointments.  Patient's wife is the primary caregiver at home.   Discharge plan is home with home health services ([facility]) according to wife.  Patient and wife voiced understanding to call provider's office for a post discharge follow-up appointment within a week or sooner if needed. Patient letter provided as a reminder. Both expressed understanding of need to establish care with the primary care provider that Dr. Einar Gip will refer patient to.  Wife denies any need for HF management at this point, stating "ever since he had his mitral valve repaired, he had not been bothered with his HF" as reported by wife.  [facility] care management contact information provided for any future needs that may arise  For additional questions please contact:  Edwena Felty A. Nolan Tuazon, BSN, RN-BC [facility] PRIMARY CARE Navigator Cell: [phone]

## 2016-06-17 NOTE — Discharge Instructions (Addendum)
Bacteremia Introduction Bacteremia is the presence of bacteria in the blood. A small amount of bacteria may not cause any symptoms. Sometimes, the bacteria spread and cause infection in other parts of the body, such as the heart, joints, bones, or brain. Having a great amount of bacteria can cause a serious, sometimes life-threatening infection called sepsis. What are the causes? This condition is caused by bacteria that get into the blood. Bacteria can enter the blood:  During a dental or medical procedure.  After you brush your teeth so hard that the gums bleed.  Through a scrape or cut on your skin. More severe types of bacteremia can be caused by:  A bacterial infection, such as pneumonia, that spreads to the blood.  Using a dirty needle. What increases the risk? This condition is more likely to develop in:  Children and elderly adults.  People who have a long-lasting (chronic) disease or medical condition.  People who have an artificial joint or heart valve.  People who have heart valve disease.  People who have a tube, such as a catheter or IV tube, that has been inserted for a medical treatment.  People who have a weak body defense system (immune system).  People who use IV drugs. What are the signs or symptoms? Usually, this condition does not cause symptoms when it is mild. When it is more serious, it may cause:  Fever.  Chills.  Racing heart.  Shortness of breath.  Dizziness.  Weakness.  Confusion.  Nausea or vomiting.  Diarrhea. Bacteremia that has spread to other parts of the body may cause symptoms in those areas. How is this diagnosed? This condition may be diagnosed with a physical exam and tests, such as:  A complete blood count (CBC). This test looks for signs of infection.  Blood cultures. These look for bacteria in your blood.  Tests of any IV tubes. These look for a source of infection.  Urine tests.  Imaging tests, such as an  X-ray, CT scan, MRI, or heart ultrasound. How is this treated? If the condition is mild, treatment is usually not needed. Usually, the bodys immune system will remove the bacteria. If the condition is more serious, it may be treated with:  Antibiotic medicines through an IV tube. These may be given for about 2 weeks. At first, the antibiotic that is given may kill most types of blood bacteria. If your test results show that a certain kind of bacteria is causing problems, the antibiotic may be changed to kill only the bacteria that are causing problems.  Antibiotics taken by mouth.  Removing any catheter or IV tube that is a source of infection.  Blood pressure and breathing support, if needed.  Surgery to control the source or spread of infection, if needed. Follow these instructions at home:  Take over-the-counter and prescription medicines only as told by your health care provider.  If you were prescribed an antibiotic, take it as told by your health care provider. Do not stop taking the antibiotic even if you start to feel better.  Rest at home until your condition is under control.  Drink enough fluid to keep your urine clear or pale yellow.  Keep all follow-up visits as told by your health care provider. This is important. How is this prevented? Take these actions to help prevent future episodes of bacteremia:  Get all vaccinations as recommended by your health care provider.  Clean and cover scrapes or cuts.  Bathe regularly.  Wash  your hands often.  Before any dental or surgical procedure, ask your health care provider if you should take an antibiotic. Contact a health care provider if:  Your symptoms get worse.  You continue to have symptoms after treatment.  You develop new symptoms after treatment. Get help right away if:  You have chest pain or trouble breathing.  You develop confusion, dizziness, or weakness.  You develop pale skin. This information is  not intended to replace advice given to you by your health care provider. Make sure you discuss any questions you have with your health care provider. Document Released: 02/07/2006 Document Revised: 11/14/2015 Document Reviewed: 06/29/2014  2017 [employer] Information on my medicine - XARELTO (Rivaroxaban)  This medication education was reviewed with me or my healthcare representative as part of my discharge preparation.    Why was Xarelto prescribed for you? Xarelto was prescribed for you to reduce the risk of a blood clot forming that can cause a stroke if you have a medical condition called atrial fibrillation (a type of irregular heartbeat).  What do you need to know about xarelto ? Take your Xarelto ONCE DAILY at the same time every day with your evening meal. If you have difficulty swallowing the tablet whole, you may crush it and mix in applesauce just prior to taking your dose.  Take Xarelto exactly as prescribed by your doctor and DO NOT stop taking Xarelto without talking to the doctor who prescribed the medication.  Stopping without other stroke prevention medication to take the place of Xarelto may increase your risk of developing a clot that causes a stroke.  Refill your prescription before you run out.  After discharge, you should have regular check-up appointments with your healthcare provider that is prescribing your Xarelto.  In the future your dose may need to be changed if your kidney function or weight changes by a significant amount.  What do you do if you miss a dose? If you are taking Xarelto ONCE DAILY and you miss a dose, take it as soon as you remember on the same day then continue your regularly scheduled once daily regimen the next day. Do not take two doses of Xarelto at the same time or on the same day.   Important Safety Information A possible side effect of Xarelto is bleeding. You should call your healthcare provider right away if you experience any  of the following: ? Bleeding from an injury or your nose that does not stop. ? Unusual colored urine (red or dark brown) or unusual colored stools (red or black). ? Unusual bruising for unknown reasons. ? A serious fall or if you hit your head (even if there is no bleeding).  Some medicines may interact with Xarelto and might increase your risk of bleeding while on Xarelto. To help avoid this, consult your healthcare provider or pharmacist prior to using any new prescription or non-prescription medications, including herbals, vitamins, non-steroidal anti-inflammatory drugs (NSAIDs) and supplements.  This website has more information on Xarelto: [email or web address]

## 2016-06-17 NOTE — Discharge Summary (Signed)
Physician Discharge Summary  Peter Bruce JOA:[id number] DOB: [birth year] DOA: 06/11/2016  PCP: Adrian Prows, MD  Admit date: 06/11/2016 Discharge date: 06/17/2016  Admitted From: Home Discharge disposition: Home   Recommendations for Outpatient Follow-Up:   1. Home health nursing set up for IV antibiotic administration. Nurse to draw weekly labs and fax to PCP.   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  Diet recommendation: Low sodium, heart healthy.    History of Present Illness:   Peter Bruce is an 66 y.o. 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 Currently further plan is continue IV antibiotics and further workup.   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 MRSA PCR was negative. 2 D Echocardiogram showed mitral valve vegetation measuring11 mm x 19 mm. Initially was on ampicillin and vancomycin and acyclovir, all discontinued.Currently on IV ceftriaxone, which he will continue through 07/25/16. Repeat culture currently not growing any organism.  Active Problems: History of pulmonary embolism/Paroxysmal atrial fibrillation (HCC) Was on Coumadin at home. Switched to [facility].  Small Multiple acute and subacute CVA. MRI brain moderate infarct in left temporal lobe, also multiple smaller infarct in inferior left occipital, right cerebellum, right cerebral, right caudate head, and bilateral parasagittal parietal cortex. Findings are concerning for embolic phenomenon, most likely septic emboli.Vascular Doppler unremarkable for any significant stenosis.Evaluated by neurologist. PT/OT with ultimate discharge to CIR when stable. Continue Xarelto and antibiotics. Speech therapy following. Will follow-up with neurologist post discharge.  Polymyalgia rheumatica--rheumatol [place]kes-2016 Worsening of the leukocytosis likely due to steroids. On chronic prednisone at home, and was increased to 10 mg prednisone here in the hospital. Received IV Decadron for suspected meningitis on presentation.  Chronic diastolic CHF (congestive heart failure) (HCC) EF 50-55 percent. Compensated.  Essential hypertension Currently holding home antihypertensive medications.  Hyponatremia Resolved.  Normocytic anemia/anemia of chronic disease Stable. Monitor.  Delirium Treated with Seroquel and Haldol as needed.  Hypercoagulable state (HCC)/Lupus erythematosus  Coronary artery disease involving coronary bypass graft of native heart without angina pectoris Continue aspirin. Continue  Lipitor.  Thrombocytopenia (HCC) Mild, platelet count stable.  Hypokalemia Repleted.  Medical Consultants:    IR  ID  Neurology  Cardiology   Discharge Exam:   Vitals:   06/16/16 2157 06/17/16 0538  BP: (!) 138/92 (!) 147/97  Pulse: 87 90  Resp: 18 18  Temp: 98 F (36.7 C) 98.1 F (36.7 C)   Vitals:   06/16/16 1800 06/16/16 1900 06/16/16 2157 06/17/16 0538  BP: (!) 137/98 137/80 (!) 138/92 (!) 147/97  Pulse:  100 87 90  Resp: 16 19 18 18   Temp:  97.8 F (36.6 C) 98 F (36.7 C) 98.1 F (36.7 C)  TempSrc:  Axillary Oral Oral  SpO2:  96% 97% 97%  Weight:      Height:        General exam: Appears calm.  Respiratory system: Clear to auscultation. Respiratory effort normal. Cardiovascular system: S1 & S2 heard, RRR. No JVD,  rubs, gallops or clicks. No murmurs. Gastrointestinal system: Abdomen is nondistended, soft and nontender. No organomegaly or masses felt. Normal bowel sounds heard. Central nervous system: Alert and oriented to self/place. No focal neurological deficits. Extremities: No clubbing,  or cyanosis. No edema. Skin: No rashes, lesions or ulcers. Psychiatry: Judgement and insight appear impaired. Mood & affect flat.    The results of significant diagnostics from this hospitalization (including imaging, microbiology, ancillary and laboratory) are listed below for reference.     Procedures and Diagnostic Studies:   Dg Chest 1 View  Result Date: 06/11/2016 CLINICAL DATA:  Weakness.  Cough. EXAM: CHEST 1 VIEW COMPARISON:  12/15/2015 . FINDINGS: Prior CABG. Left atrial appendage clip noted. Cardiomegaly with bilateral mild interstitial prominence consistent mild CHF. No pleural effusion noted on today's exam. No pneumothorax. IMPRESSION: Prior CABG. Mild pulmonary interstitial prominence consistent with mild CHF . Electronically Signed   By: Marcello Moores  Register   On: 06/11/2016 11:03   Dg Pelvis 1-2 Views  Result Date: 06/11/2016 CLINICAL DATA:   Pelvic pain.  Fall . EXAM: PELVIS - 1-2 VIEW COMPARISON:  CT 11/12/2015 . FINDINGS: Degenerative changes lumbar spine and both hips. No acute bony or joint abnormality identified. Clips in staples noted. Pelvic calcifications consistent phleboliths. IMPRESSION: Degenerative changes lumbar spine and both hips. No acute abnormality. Electronically Signed   By: Marcello Moores  Register   On: 06/11/2016 15:24   Ct Head Wo Contrast  Result Date: 06/11/2016 CLINICAL DATA:  Multiple falls recently. EXAM: CT HEAD WITHOUT CONTRAST TECHNIQUE: Contiguous axial images were obtained from the base of the skull through the vertex without intravenous contrast. COMPARISON:  None. FINDINGS: Brain: Patchy areas of low attenuation within the bilateral subcortical white matter identified compatible with chronic small vessel ischemic change. Prominence of the sulci and ventricles identified compatible with brain atrophy. There is no abnormal extra-axial fluid collection, intracranial hemorrhage or mass. No acute brain infarct. Vascular: No hyperdense vessel or unexpected calcification. Skull: Normal. Negative for fracture or focal lesion. Sinuses/Orbits: The paranasal sinuses are clear. There is partial opacification of the right mastoid air cells. Other: None. IMPRESSION: 1. No acute intracranial abnormalities. 2. Small vessel ischemic change and brain atrophy. 3. Partial opacification of the right mastoid air cells. Electronically Signed   By: Kerby Moors M.D.   On: 06/11/2016 11:12   Mr Brain Wo Contrast  Result Date: 06/12/2016 CLINICAL DATA:  Acute encephalopathy. History of lupus and pulmonary embolism. Recent diagnosis of tongue cancer. EXAM: MRI HEAD WITHOUT CONTRAST TECHNIQUE: Multiplanar, multiecho pulse sequences of the brain and surrounding structures were obtained without intravenous contrast. COMPARISON:  Head CT from yesterday  FINDINGS: Brain: Scattered recent infarcts, including a moderate area of infarct in the lateral  and anterior left temporal lobe. Smaller areas of infarction in the inferior left occipital cortex, right cerebellum, right posterior cerebral white matter, right caudate head, and bilateral parasagittal parietal cortex. Some of the smaller infarcts have weaker diffusion restriction and may be subacute. The left temporal and occipital infarcts have mild petechial hemorrhage based on gradient imaging. There is also superficial T1 hyperintensity at these infarcts. Background chronic ischemic injury is overall mild. There has been a remote lacunar infarct in the right centrum semiovale. No hydrocephalus. History of tongue cancer. No overt mass. Intracranial screening would require contrast. Vascular: Preserved major flow voids. Skull and upper cervical spine: No marrow lesion noted. Sinuses/Orbits: Mastoid fluid on the right with negative nasopharynx. 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 Moderate area of infarction in the lateral left temporal lobe. Mild petechial hemorrhage. Electronically Signed   By: Monte Fantasia M.D.   On: 06/12/2016 16:41   Mr Cervical Spine W Or Wo Contrast  Result Date: 06/11/2016 CLINICAL DATA:  Leg weakness. History of recent tongue cancer diagnosis. EXAM: MRI TOTAL SPINE WITHOUT AND WITH CONTRAST TECHNIQUE: Multisequence MR imaging of the spine from the cervical spine to the sacrum was performed prior to and following IV contrast administration for evaluation of spinal metastatic disease. Multiplanar and multiecho pulse sequences of the thoracic and lumbar spine were obtained without and with intravenous contrast. Study is a metastatic screening survey anal E sagittal acquisitions were obtained. CONTRAST:  79m MULTIHANCE GADOBENATE DIMEGLUMINE 529 MG/ML IV SOLN COMPARISON:  None. FINDINGS: MRI CERVICAL SPINE FINDINGS Alignment: No traumatic malalignment. Chronic C7-T1 anterolisthesis, facet mediated. Vertebrae: No  fracture, evidence of discitis, or bone lesion. Cord:  Normal signal and morphology. Paraspinal and other soft tissues: The patient's tongue base/vallecular mass is partially visualized. Prevertebral fluid noted from C3-C4 without avid enhancement. This was not clearly seen on recent neck CT. There is disc T2 hyperintensity at C3-4 which appears continuous with the prevertebral fluid on one image. This may reflect an injury in this patient with multiple falls. No marrow edema or erosion to suggest discitis. No posterior element injury noted. Disc levels: Lower cervical degenerative changes. Upper cervical predominant facet arthropathy. Spinal canal is patent. Foraminal stenoses not well evaluated without axials. MRI THORACIC SPINE FINDINGS Alignment:  Exaggerated thoracic kyphosis. Vertebrae: No evidence of metastasis, fracture, or discitis. Cord: Normal signal and morphology. No abnormal intrathecal enhancement. Paraspinal and other soft tissues: No acute finding Disc levels: Spondylosis. Patent spinal canal. No evidence of foraminal impingement. MRI LUMBAR SPINE FINDINGS Segmentation:  Standard. Alignment: Exaggerated lordosis. L2-3 translation and retrolisthesis. L4-5 anterolisthesis. Vertebrae:  No evidence of metastasis, acute fracture, or discitis. Conus medullaris: Extends to the L1-2 level and appears normal. Paraspinal and other soft tissues: Extensive colonic diverticulosis. Disc levels: Diffuse facet arthropathy and degenerative changes. Diffusely patent spinal canal. No high-grade foraminal impingement. IMPRESSION: 1. Negative for spinal metastasis or fracture. Diffusely patent spinal canal. 2. Small volume retropharyngeal fluid. The C3-4 disc is also T2 hyperintense. These changes could be posttraumatic in this patient with history of multiple falls; no evidence of acute fracture. No marrow edema as would be expected for discitis. Electronically Signed   By: JMonte FantasiaM.D.   On: 06/11/2016 20:15    Mr Thoracic Spine W Wo Contrast  Result Date: 06/11/2016 CLINICAL DATA:  Leg weakness. History of recent tongue cancer diagnosis. EXAM:  MRI TOTAL SPINE WITHOUT AND WITH CONTRAST TECHNIQUE: Multisequence MR imaging of the spine from the cervical spine to the sacrum was performed prior to and following IV contrast administration for evaluation of spinal metastatic disease. Multiplanar and multiecho pulse sequences of the thoracic and lumbar spine were obtained without and with intravenous contrast. Study is a metastatic screening survey anal E sagittal acquisitions were obtained. CONTRAST:  42m MULTIHANCE GADOBENATE DIMEGLUMINE 529 MG/ML IV SOLN COMPARISON:  None. FINDINGS: MRI CERVICAL SPINE FINDINGS Alignment: No traumatic malalignment. Chronic C7-T1 anterolisthesis, facet mediated. Vertebrae: No fracture, evidence of discitis, or bone lesion. Cord:  Normal signal and morphology. Paraspinal and other soft tissues: The patient's tongue base/vallecular mass is partially visualized. Prevertebral fluid noted from C3-C4 without avid enhancement. This was not clearly seen on recent neck CT. There is disc T2 hyperintensity at C3-4 which appears continuous with the prevertebral fluid on one image. This may reflect an injury in this patient with multiple falls. No marrow edema or erosion to suggest discitis. No posterior element injury noted. Disc levels: Lower cervical degenerative changes. Upper cervical predominant facet arthropathy. Spinal canal is patent. Foraminal stenoses not well evaluated without axials. MRI THORACIC SPINE FINDINGS Alignment:  Exaggerated thoracic kyphosis. Vertebrae: No evidence of metastasis, fracture, or discitis. Cord: Normal signal and morphology. No abnormal intrathecal enhancement. Paraspinal and other soft tissues: No acute finding Disc levels: Spondylosis. Patent spinal canal. No evidence of foraminal impingement. MRI LUMBAR SPINE FINDINGS Segmentation:  Standard. Alignment:  Exaggerated lordosis. L2-3 translation and retrolisthesis. L4-5 anterolisthesis. Vertebrae:  No evidence of metastasis, acute fracture, or discitis. Conus medullaris: Extends to the L1-2 level and appears normal. Paraspinal and other soft tissues: Extensive colonic diverticulosis. Disc levels: Diffuse facet arthropathy and degenerative changes. Diffusely patent spinal canal. No high-grade foraminal impingement. IMPRESSION: 1. Negative for spinal metastasis or fracture. Diffusely patent spinal canal. 2. Small volume retropharyngeal fluid. The C3-4 disc is also T2 hyperintense. These changes could be posttraumatic in this patient with history of multiple falls; no evidence of acute fracture. No marrow edema as would be expected for discitis. Electronically Signed   By: JMonte FantasiaM.D.   On: 06/11/2016 20:15   Mr Lumbar Spine W Wo Contrast (assess For Abscess, Cord Compression)  Result Date: 06/11/2016 CLINICAL DATA:  Leg weakness. History of recent tongue cancer diagnosis. EXAM: MRI TOTAL SPINE WITHOUT AND WITH CONTRAST TECHNIQUE: Multisequence MR imaging of the spine from the cervical spine to the sacrum was performed prior to and following IV contrast administration for evaluation of spinal metastatic disease. Multiplanar and multiecho pulse sequences of the thoracic and lumbar spine were obtained without and with intravenous contrast. Study is a metastatic screening survey anal E sagittal acquisitions were obtained. CONTRAST:  198mMULTIHANCE GADOBENATE DIMEGLUMINE 529 MG/ML IV SOLN COMPARISON:  None. FINDINGS: MRI CERVICAL SPINE FINDINGS Alignment: No traumatic malalignment. Chronic C7-T1 anterolisthesis, facet mediated. Vertebrae: No fracture, evidence of discitis, or bone lesion. Cord:  Normal signal and morphology. Paraspinal and other soft tissues: The patient's tongue base/vallecular mass is partially visualized. Prevertebral fluid noted from C3-C4 without avid enhancement. This was not clearly seen  on recent neck CT. There is disc T2 hyperintensity at C3-4 which appears continuous with the prevertebral fluid on one image. This may reflect an injury in this patient with multiple falls. No marrow edema or erosion to suggest discitis. No posterior element injury noted. Disc levels: Lower cervical degenerative changes. Upper cervical predominant facet arthropathy. Spinal canal is patent. Foraminal stenoses not well  evaluated without axials. MRI THORACIC SPINE FINDINGS Alignment:  Exaggerated thoracic kyphosis. Vertebrae: No evidence of metastasis, fracture, or discitis. Cord: Normal signal and morphology. No abnormal intrathecal enhancement. Paraspinal and other soft tissues: No acute finding Disc levels: Spondylosis. Patent spinal canal. No evidence of foraminal impingement. MRI LUMBAR SPINE FINDINGS Segmentation:  Standard. Alignment: Exaggerated lordosis. L2-3 translation and retrolisthesis. L4-5 anterolisthesis. Vertebrae:  No evidence of metastasis, acute fracture, or discitis. Conus medullaris: Extends to the L1-2 level and appears normal. Paraspinal and other soft tissues: Extensive colonic diverticulosis. Disc levels: Diffuse facet arthropathy and degenerative changes. Diffusely patent spinal canal. No high-grade foraminal impingement. IMPRESSION: 1. Negative for spinal metastasis or fracture. Diffusely patent spinal canal. 2. Small volume retropharyngeal fluid. The C3-4 disc is also T2 hyperintense. These changes could be posttraumatic in this patient with history of multiple falls; no evidence of acute fracture. No marrow edema as would be expected for discitis. Electronically Signed   By: Monte Fantasia M.D.   On: 06/11/2016 20:15     Labs:   Basic Metabolic Panel:  Recent Labs Lab 06/12/16 1118 06/13/16 0312 06/15/16 0310 06/16/16 0344 06/17/16 0427  NA 137 136 140 138 135  K 4.3 4.5 3.8 4.1 3.2*  CL 104 105 103 100* 97*  CO2 28 26 27 29 27   GLUCOSE 183* 170* 118* 101* 94  BUN 19  20 18 16 17   CREATININE 1.14 1.11 1.02 1.19 0.99  CALCIUM 8.4* 8.5* 8.7* 8.7* 8.8*  MG  --  2.2  --   --   --    GFR Estimated Creatinine Clearance: 74.8 mL/min (by C-G formula based on SCr of 0.99 mg/dL). Liver Function Tests:  Recent Labs Lab 06/12/16 1118 06/13/16 0312  AST 39  38 34  ALT 23  23 24   ALKPHOS 47  48 43  BILITOT 0.7  0.6 0.4  PROT 6.2*  6.2* 5.9*  ALBUMIN 2.5*  2.5* 2.5*   No results for input(s): LIPASE, AMYLASE in the last 168 hours.  Recent Labs Lab 06/12/16 1118  AMMONIA <9*   Coagulation profile  Recent Labs Lab 06/11/16 1219 06/12/16 1118 06/13/16 0312 06/14/16 0237 06/15/16 0310  INR 1.98 2.04 1.37 1.30 2.12    CBC:  Recent Labs Lab 06/11/16 1035  06/12/16 1118 06/13/16 0312 06/15/16 0310 06/16/16 0344 06/17/16 0427  WBC 21.3*  --  16.2* 24.7* 18.8* 16.6* 19.6*  NEUTROABS 19.2*  --  15.5* 23.6*  --   --   --   HGB 11.3*  < > 10.0* 9.7* 9.4* 10.4* 11.7*  HCT 34.1*  < > 30.3* 29.0* 28.1* 31.5* 34.4*  MCV 91.9  --  92.7 91.8 92.7 93.5 92.7  PLT 127*  --  91* 99* 106* 115* 131*  < > = values in this interval not displayed. Cardiac Enzymes: No results for input(s): CKTOTAL, CKMB, CKMBINDEX, TROPONINI in the last 168 hours. BNP: Invalid input(s): POCBNP CBG:  Recent Labs Lab 06/11/16 1011 06/14/16 0845  GLUCAP 86 147*   D-Dimer No results for input(s): DDIMER in the last 72 hours. Hgb A1c No results for input(s): HGBA1C in the last 72 hours. Lipid Profile No results for input(s): CHOL, HDL, LDLCALC, TRIG, CHOLHDL, LDLDIRECT in the last 72 hours. Thyroid function studies No results for input(s): TSH, T4TOTAL, T3FREE, THYROIDAB in the last 72 hours.  Invalid input(s): FREET3 Anemia work up No results for input(s): VITAMINB12, FOLATE, FERRITIN, TIBC, IRON, RETICCTPCT in the last 72 hours. Microbiology Recent Results (from the  past 240 hour(s))  Culture, blood (Routine x 2)     Status: Abnormal   Collection Time:  06/11/16 10:40 AM  Result Value Ref Range Status   Specimen Description BLOOD RIGHT FOREARM  Final   Special Requests BOTTLES DRAWN AEROBIC AND ANAEROBIC 5CC  Final   Culture  Setup Time   Final    GRAM POSITIVE COCCI IN CHAINS IN BOTH AEROBIC AND ANAEROBIC BOTTLES CRITICAL RESULT CALLED TO, READ BACK BY AND VERIFIED WITH: E BARTON,PHARMD AT 0930 06/12/16 BY L BENFIELD    Culture VIRIDANS STREPTOCOCCUS (A)  Final   Report Status 06/14/2016 FINAL  Final   Organism ID, Bacteria VIRIDANS STREPTOCOCCUS  Final      Susceptibility   Viridans streptococcus - MIC*    PENICILLIN 0.12 SENSITIVE Sensitive     CEFTRIAXONE <=0.12 SENSITIVE Sensitive     ERYTHROMYCIN >=8 RESISTANT Resistant     LEVOFLOXACIN 1 SENSITIVE Sensitive     VANCOMYCIN 0.5 SENSITIVE Sensitive     * VIRIDANS STREPTOCOCCUS  Blood Culture ID Panel (Reflexed)     Status: Abnormal   Collection Time: 06/11/16 10:40 AM  Result Value Ref Range Status   Enterococcus species NOT DETECTED NOT DETECTED Final   Listeria monocytogenes NOT DETECTED NOT DETECTED Final   Staphylococcus species NOT DETECTED NOT DETECTED Final   Staphylococcus aureus NOT DETECTED NOT DETECTED Final   Streptococcus species DETECTED (A) NOT DETECTED Final    Comment: Not Enterococcus species, Streptococcus agalactiae, Streptococcus pyogenes, or Streptococcus pneumoniae. CRITICAL RESULT CALLED TO, READ BACK BY AND VERIFIED WITH: E BARTON,PHARMD AT 0930 06/12/16 BY L BENFIELD    Streptococcus agalactiae NOT DETECTED NOT DETECTED Final   Streptococcus pneumoniae NOT DETECTED NOT DETECTED Final   Streptococcus pyogenes NOT DETECTED NOT DETECTED Final   Acinetobacter baumannii NOT DETECTED NOT DETECTED Final   Enterobacteriaceae species NOT DETECTED NOT DETECTED Final   Enterobacter cloacae complex NOT DETECTED NOT DETECTED Final   Escherichia coli NOT DETECTED NOT DETECTED Final   Klebsiella oxytoca NOT DETECTED NOT DETECTED Final   Klebsiella pneumoniae NOT  DETECTED NOT DETECTED Final   Proteus species NOT DETECTED NOT DETECTED Final   Serratia marcescens NOT DETECTED NOT DETECTED Final   Haemophilus influenzae NOT DETECTED NOT DETECTED Final   Neisseria meningitidis NOT DETECTED NOT DETECTED Final   Pseudomonas aeruginosa NOT DETECTED NOT DETECTED Final   Candida albicans NOT DETECTED NOT DETECTED Final   Candida glabrata NOT DETECTED NOT DETECTED Final   Candida krusei NOT DETECTED NOT DETECTED Final   Candida parapsilosis NOT DETECTED NOT DETECTED Final   Candida tropicalis NOT DETECTED NOT DETECTED Final  Culture, blood (Routine x 2)     Status: Abnormal   Collection Time: 06/11/16 12:23 PM  Result Value Ref Range Status   Specimen Description BLOOD RIGHT ANTECUBITAL  Final   Special Requests BOTTLES DRAWN AEROBIC AND ANAEROBIC 5CC  Final   Culture  Setup Time   Final    GRAM POSITIVE COCCI IN CHAINS IN BOTH AEROBIC AND ANAEROBIC BOTTLES CRITICAL RESULT CALLED TO, READ BACK BY AND VERIFIED WITH: E BARTON,PHARMD AT 0930 06/12/16 BY L BENFIELD    Culture (A)  Final    VIRIDANS STREPTOCOCCUS SUSCEPTIBILITIES PERFORMED ON PREVIOUS CULTURE WITHIN THE LAST 5 DAYS.    Report Status 06/14/2016 FINAL  Final  Urine culture     Status: Abnormal   Collection Time: 06/11/16  4:36 PM  Result Value Ref Range Status   Specimen Description URINE, RANDOM  Final  Special Requests NONE  Final   Culture MULTIPLE SPECIES PRESENT, SUGGEST RECOLLECTION (A)  Final   Report Status 06/12/2016 FINAL  Final  MRSA PCR Screening     Status: None   Collection Time: 06/12/16  6:40 AM  Result Value Ref Range Status   MRSA by PCR NEGATIVE NEGATIVE Final    Comment:        The GeneXpert MRSA Assay (FDA approved for NASAL specimens only), is one component of a comprehensive MRSA colonization surveillance program. It is not intended to diagnose MRSA infection nor to guide or monitor treatment for MRSA infections.   Culture, blood (routine x 2)     Status:  None (Preliminary result)   Collection Time: 06/14/16  2:35 AM  Result Value Ref Range Status   Specimen Description BLOOD LEFT ANTECUBITAL  Final   Special Requests BOTTLES DRAWN AEROBIC AND ANAEROBIC 6CC EA  Final   Culture NO GROWTH 3 DAYS  Final   Report Status PENDING  Incomplete  Culture, blood (routine x 2)     Status: None (Preliminary result)   Collection Time: 06/14/16  2:42 AM  Result Value Ref Range Status   Specimen Description BLOOD BLOOD RIGHT HAND  Final   Special Requests BOTTLES DRAWN AEROBIC AND ANAEROBIC  5CC EA  Final   Culture NO GROWTH 3 DAYS  Final   Report Status PENDING  Incomplete     Discharge Instructions:   Discharge Instructions    Ambulatory referral to Neurology    Complete by:  As directed    Stroke patient. Dr. Leonie Man prefers follow up in 6 weeks   Ambulatory referral to Physical Medicine Rehab    Complete by:  As directed    Stroke follow up 1 month post-discharge   Call MD for:  extreme fatigue    Complete by:  As directed    Call MD for:  persistant nausea and vomiting    Complete by:  As directed    Call MD for:  severe uncontrolled pain    Complete by:  As directed    Call MD for:  temperature >100.4    Complete by:  As directed    Diet - low sodium heart healthy    Complete by:  As directed    Face-to-face encounter (required for Medicare/Medicaid patients)    Complete by:  As directed    I RAMA,CHRISTINA certify that this patient is under my care and that I, or a nurse practitioner or physician's assistant working with me, had a face-to-face encounter that meets the physician face-to-face encounter requirements with this patient on 06/17/2016. The encounter with the patient was in whole, or in part for the following medical condition(s) which is the primary reason for home health care (List medical condition): RN to teach wife how to administer antibiotics and draw labs as indicated in orders.   The encounter with the patient was in whole,  or in part, for the following medical condition, which is the primary reason for home health care:  Endocarditis   I certify that, based on my findings, the following services are medically necessary home health services:  Nursing   Reason for Medically Necessary Home Health Services:   Skilled Nursing- Administration and Training of Injectable Medication Skilled Nursing- Assessment and Training for Infusion Therapy, Line Care, and Infection Control Skilled Nursing- Lab Monitoring Requiring Frequent Medication Adjustment Skilled Nursing- Teaching of Disease Process/Symptom Management Skilled Nursing- Skilled Assessment/Observation Skilled Nursing- Changes in Medication/Medication Management  My clinical findings support the need for the above services:  Cognitive impairments, dementia, or mental confusion  that make it unsafe to leave home   Further, I certify that my clinical findings support that this patient is homebound due to:  Mental confusion   Home Health    Complete by:  As directed    To provide the following care/treatments:  RN   Home infusion instructions [facility] May follow [place] Dosing Protocol; May administer Cathflo as needed to maintain patency of vascular access device.; Flushing of vascular access device: per [facility] Protocol: 0.9% NaCl pre/post medica...    Complete by:  As directed    Instructions:  May follow [place] Dosing Protocol   Instructions:  May administer Cathflo as needed to maintain patency of vascular access device.   Instructions:  Flushing of vascular access device: per [facility] Protocol: 0.9% NaCl pre/post medication administration and prn patency; Heparin 100 u/ml, 71m for implanted ports and Heparin 10u/ml, 515mfor all other central venous catheters.   Instructions:  May follow [facility] Anaphylaxis Protocol for First Dose Administration in the home: 0.9% NaCl at 25-50 ml/hr to maintain IV access for protocol meds. Epinephrine 0.3 ml IV/IM PRN and  Benadryl 25-50 IV/IM PRN s/s of anaphylaxis.   Instructions:  [facility] Coordinator (RN) to assist per patient IV care needs in the home PRN.   Increase activity slowly    Complete by:  As directed      Allergies as of 06/17/2016      Reactions   Amitriptyline Shortness Of Breath   Serotonin Reuptake Inhibitors (ssris) Nausea And Vomiting   SSRI uptake makes him extremely sick.      Medication List    STOP taking these medications   warfarin 7.5 MG tablet Commonly known as:  COUMADIN     TAKE these medications   acetaminophen 500 MG tablet Commonly known as:  TYLENOL Take 1,000 mg by mouth every 8 (eight) hours as needed for mild pain or moderate pain.   amLODipine 10 MG tablet Commonly known as:  NORVASC Take 10 mg by mouth daily.   aspirin 325 MG tablet Take 1 tablet (325 mg total) by mouth daily. Start taking on:  06/18/2016   atorvastatin 40 MG tablet Commonly known as:  LIPITOR Take 1 tablet (40 mg total) by mouth daily at 6 PM.   cefTRIAXone IVPB Commonly known as:  ROCEPHIN Inject 2 g into the vein every 12 (twelve) hours. Indication:  Endocarditis + Bacteremia Last Day of Therapy:  07/25/16 Labs - Once weekly:  CBC/D and BMP, Labs - Every other week:  ESR and CRP   metoprolol succinate 25 MG 24 hr tablet Commonly known as:  TOPROL-XL Take 0.5 tablets (12.5 mg total) by mouth daily.   multivitamin tablet Take 1 tablet by mouth daily.   predniSONE 10 MG tablet Commonly known as:  DELTASONE Take 1 tablet (10 mg total) by mouth daily. What changed:  medication strength  how much to take   QUEtiapine 25 MG tablet Commonly known as:  SEROQUEL Take 1 tablet (25 mg total) by mouth at bedtime.   rivaroxaban 20 MG Tabs tablet Commonly known as:  XARELTO Take 1 tablet (20 mg total) by mouth daily with supper.   valsartan 160 MG tablet Commonly known as:  DIOVAN Take 1 tablet (160 mg total) by mouth daily.   vitamin C 100 MG tablet Take  100 mg by mouth daily.  zolpidem 10 MG tablet Commonly known as:  AMBIEN Take 10 mg by mouth daily.            Home Infusion Instuctions        Start     Ordered   06/17/16 0000  Home infusion instructions [facility] May follow [place] Dosing Protocol; May administer Cathflo as needed to maintain patency of vascular access device.; Flushing of vascular access device: per [facility] Protocol: 0.9% NaCl pre/post medica...    Question Answer Comment  Instructions May follow [place] Dosing Protocol   Instructions May administer Cathflo as needed to maintain patency of vascular access device.   Instructions Flushing of vascular access device: per [facility] Protocol: 0.9% NaCl pre/post medication administration and prn patency; Heparin 100 u/ml, 63m for implanted ports and Heparin 10u/ml, 56mfor all other central venous catheters.   Instructions May follow [facility] Anaphylaxis Protocol for First Dose Administration in the home: 0.9% NaCl at 25-50 ml/hr to maintain IV access for protocol meds. Epinephrine 0.3 ml IV/IM PRN and Benadryl 25-50 IV/IM PRN s/s of anaphylaxis.   Instructions [facility] Infusion Coordinator (RN) to assist per patient IV care needs in the home PRN.      06/17/16 1654       Durable Medical Equipment        Start     Ordered   06/17/16 1408  For home use only DME Walker rolling  Once    Question:  Patient needs a walker to treat with the following condition  Answer:  Weakness generalized   06/17/16 1407     Follow-up Information    SETHI,PRAMOD, MD Follow up in 6 week(s).   Specialties:  Neurology, Radiology Why:  stroke clinic. office will call with appt date and time. Contact information: [address] [phone]        GAAdrian ProwsMD. Schedule an appointment as soon as possible for a visit in 1 week(s).   Specialty:  Cardiology Why:  Hospital follow up. Contact information: [phone]          Time coordinating discharge: 45 minutes.  Signed:  RAMA,CHRISTINA  Pager [phone] 06/17/2016, 5:34 PM

## 2016-06-17 NOTE — Care Management Note (Signed)
Case Management Note  Patient Details  Name: Ledarrius Rosati MRN: [id number] Date of Birth: August 13, 1950  Subjective/Objective:                 Spoke with patient and wife at bedside. PICC in place, per bedside RN plan is to DC to home with IV Abx, Rocephin x 6 weeks. Referral in place with [facility]. CM notified Pam, clinical liaison [facility] of PICC placement and expected DC today. Arranged for patient's wife and Pam to meet shortly after 3:00 today. CM spoke with Butch Penny clinical liaison [facility] HH, notified of DC to home w [facility] and [place] IV. CM spoke to [place] clinical liaison [facility] DME to have RW delivered to room by 3:00 today. No further CM needs identified with wife (states they have BSC at home already).    Action/Plan:  Patient to DC to home today w [place] and IV. Awaiting MD orders for [facility].  Expected Discharge Date:                  Expected Discharge Plan:  [place]  In-House Referral:     Discharge planning Services  CM Consult, Medication Assistance  Post Acute Care Choice:  Durable Medical Equipment, Home Health Choice offered to:  Spouse  DME Arranged:  IV pump/equipment DME Agency:  [place] Arranged:  RN, PT [facility] Agency:  [place]  Status of Service:  Completed, signed off  If discussed at [place] of Stay Meetings, dates discussed:    Additional Comments:  Carles Collet, RN 06/17/2016, 2:11 PM

## 2016-06-17 NOTE — Progress Notes (Signed)
I met with pt's wife at bedside this morning and she prefers d/c home with [facility] at this time. We will sign off. [phone]

## 2016-06-17 NOTE — Progress Notes (Signed)
Occupational Therapy Treatment Patient Details Name: Peter Bruce MRN: [id number] DOB: [birth year] Today's Date: 06/17/2016    History of present illness Patient is a 66 yo male admitted 06/11/16 with fever and confusion, and LE weakness.  Patient was found to be septic, and encephalopathic. 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  Per chart, poss embolic infective endocarditis.     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   OT comments  Pt able to perform grooming tasks x4 standing at the sink with min guard assist and cues for posture. Pt requires min assist for functional mobility at this time. Educated pt and wife on home safety, fall prevention, and energy conservation strategies. Educated wife on signs/symptoms of CVA; she verbalized understanding. Plan is to now d/c home with 24/7 supervision; updated follow [facility] to [facility]. Will continue to follow acutely.   Follow Up Recommendations  Home health OT;Supervision/Assistance - 24 hour    Equipment Recommendations  3 in 1 bedside commode    Recommendations for Other Services      Precautions / Restrictions Precautions Precautions: Fall Precaution Comments: Recent falls at home.  Confusion. Restrictions Weight Bearing Restrictions: No       Mobility Bed Mobility Overal bed mobility: Needs Assistance Bed Mobility: Rolling;Sidelying to Sit Rolling: Supervision Sidelying to sit: Supervision   Sit to supine: Min assist   General bed mobility comments: Heavy use of bed rail with supine > sit.  A for LE to get legs up onto bed with return to supine  Transfers Overall transfer level: Needs assistance Equipment used: Rolling walker (2 wheeled) Transfers: Sit to/from Stand Sit to Stand: Min guard         General transfer comment: cues for hand  placement    Balance     Sitting balance-Leahy Scale: Good     Standing balance support: During functional activity;No upper extremity supported Standing balance-Leahy Scale: Fair                     ADL Overall ADL's : Needs assistance/impaired     Grooming: Min guard;Standing;Wash/dry hands;Wash/dry face;Oral care;Brushing hair Grooming Details (indicate cue type and reason): Cues for posture         Upper Body Dressing : Minimal assistance;Sitting Upper Body Dressing Details (indicate cue type and reason): to don/doff hospital gown Lower Body Dressing: Total assistance;Bed level Lower Body Dressing Details (indicate cue type and reason): wife donned socks at bed level Toilet Transfer: Minimal assistance;Ambulation;RW Toilet Transfer Details (indicate cue type and reason): Simulated by sit to stand from EOB with functional mobility in room. Cues for hand placement with RW         Functional mobility during ADLs: Minimal assistance;Rolling walker General ADL Comments: Educated pt and wife on fall prevention, home safety, and energy conservation. Educated pts wife on signs/symptoms of CVA and importance of returning to hospital immideately if experiencing CVA symptoms.      Vision                     Perception     Praxis      Cognition   Behavior During Therapy: Flat affect Overall Cognitive Status: Impaired/Different from baseline          Following Commands: Follows one step commands consistently;Follows  one step commands with increased time;Follows multi-step commands inconsistently Safety/Judgement: Decreased awareness of safety;Decreased awareness of deficits   Problem Solving: Slow processing      Extremity/Trunk Assessment               Exercises     Shoulder Instructions       General Comments      Pertinent Vitals/ Pain       Pain Assessment: No/denies pain  Home Living                                           Prior Functioning/Environment              Frequency  Min 2X/week        Progress Toward Goals  OT Goals(current goals can now be found in the care plan section)  Progress towards OT goals: Progressing toward goals  Acute Rehab OT Goals Patient Stated Goal: home today OT Goal Formulation: With patient/family  Plan Discharge plan needs to be updated    Co-evaluation    PT/OT/SLP Co-Evaluation/Treatment: Yes Reason for Co-Treatment: For patient/therapist safety PT goals addressed during session: Mobility/safety with mobility;Balance;Proper use of DME OT goals addressed during session: ADL's and self-care      End of Session Equipment Utilized During Treatment: Gait belt;Rolling walker   Activity Tolerance Patient tolerated treatment well   Patient Left in bed;with call bell/phone within reach;with family/visitor present   Nurse Communication Mobility status        Time: [id number] OT Time Calculation (min): 29 min  Charges: OT General Charges $OT Visit: 1 Procedure OT Treatments $Self Care/Home Management : 8-22 mins  Binnie Kand M.S., OTR/L Pager: [phone]  06/17/2016, 1:48 PM

## 2016-06-17 NOTE — Progress Notes (Signed)
Speech Language Pathology Treatment: Dysphagia;Cognitive-Linquistic  Patient Details Name: Peter Bruce MRN: [id number] DOB: December 08, 1950 Today's Date: 06/17/2016 Time: [id number] SLP Time Calculation (min) (ACUTE ONLY): 29 min  Assessment / Plan / Recommendation Clinical Impression  Skilled observation with Dysphagia 3/thin liquid lunch tray with oral holding noted intermittently without mod verbal cues to swallow; pt exhibited an intermittent delayed throat clear/cough and eventual wet vocal quality suspect d/t probable base of tongue issues d/t recent lingual CA dx; MBS attempted while pt in house and refused; pt is a mild-moderate aspiration risk d/t impaired mentation and overt s/s of aspiration during po intake. Pt oriented to self and partially to situation, but not to time/place during this tx session; decreased awareness of deficits, decreased sustained attention and overall delay in processing information noted during this session; nursing stated he had Haldol earlier this am, which could certainly alter mentation as well; po intake advised only when pt alert and using swallowing precautions with intermittent supervision; Dysphagia 3/thin liquid diet; f/u with OP MBS recommended.   HPI HPI: Peter Bruce an 66 y.o.male. with CAD s/p CABG, paroxysmal atrial fibrillation and atrial flutter s/p MAZE procedure and on chronic anticoagulation, mitral regurgitation s/p valve repair, polymyalgia rheumatica on chronic steroid therapy, lupus anticoagulant positivity hospitalized for Strep viridans bacteremia.  On 06/10/16, he underwent biopsy of a lesion of the right oral cavity by ENT. He did not receive antibiotic prophylaxis as he wanted to have the biopsy done in the office. Unfortunately, he developed fever, weakness, poor appetite and presented to the ED on 06/11/16. He was started empirically on vancomycin, piperacillin/tazobactam, acyclovir, ampicillin which was narrowed to ceftriaxone and acyclovir. 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 TTE yesterday suggested a vegetation of the left ventricular aspect of the anterior leaflet      SLP Plan  Other (Comment) (recommend OP MBS if family/pt agree; refused in hospital)     Recommendations  Diet recommendations: Dysphagia 3 (mechanical soft);Thin liquid Liquids provided via: Cup Medication Administration: Whole meds with puree Supervision: Patient able to self feed;Intermittent supervision to cue for compensatory strategies Compensations: Minimize environmental distractions;Slow rate;Small sips/bites;Other (Comment)                Oral Care Recommendations: Oral care BID Follow up Recommendations: Inpatient Rehab Plan: Other (Comment) (recommend OP MBS if family/pt agree; refused in hospital)                       ADAMS,PAT, M.S., CCC-SLP 06/17/2016, 2:41 PM

## 2016-06-17 NOTE — Progress Notes (Signed)
Patient arrived to floor on stretcher, accompanied by [place], Therapist, sports from 4E. Patient is alert and oriented to self and date of birth. Armband verified patient. AVAS sitter placed in room. Bed alarm on. Side rails up X 2. Safety maintained.

## 2016-06-18 ENCOUNTER — Encounter: Payer: Self-pay | Admitting: Thoracic Surgery (Cardiothoracic Vascular Surgery)

## 2016-06-18 ENCOUNTER — Telehealth: Payer: Self-pay | Admitting: Thoracic Surgery (Cardiothoracic Vascular Surgery)

## 2016-06-18 DIAGNOSIS — I482 Chronic atrial fibrillation: Secondary | ICD-10-CM | POA: Diagnosis not present

## 2016-06-18 DIAGNOSIS — I25810 Atherosclerosis of coronary artery bypass graft(s) without angina pectoris: Secondary | ICD-10-CM | POA: Diagnosis not present

## 2016-06-18 DIAGNOSIS — D649 Anemia, unspecified: Secondary | ICD-10-CM | POA: Diagnosis not present

## 2016-06-18 DIAGNOSIS — Z7952 Long term (current) use of systemic steroids: Secondary | ICD-10-CM | POA: Diagnosis not present

## 2016-06-18 DIAGNOSIS — Z792 Long term (current) use of antibiotics: Secondary | ICD-10-CM | POA: Diagnosis not present

## 2016-06-18 DIAGNOSIS — Z8673 Personal history of transient ischemic attack (TIA), and cerebral infarction without residual deficits: Secondary | ICD-10-CM | POA: Diagnosis not present

## 2016-06-18 DIAGNOSIS — C069 Malignant neoplasm of mouth, unspecified: Secondary | ICD-10-CM | POA: Diagnosis not present

## 2016-06-18 DIAGNOSIS — I330 Acute and subacute infective endocarditis: Secondary | ICD-10-CM | POA: Diagnosis not present

## 2016-06-18 DIAGNOSIS — M353 Polymyalgia rheumatica: Secondary | ICD-10-CM | POA: Diagnosis not present

## 2016-06-18 DIAGNOSIS — I110 Hypertensive heart disease with heart failure: Secondary | ICD-10-CM | POA: Diagnosis not present

## 2016-06-18 DIAGNOSIS — Z7901 Long term (current) use of anticoagulants: Secondary | ICD-10-CM | POA: Diagnosis not present

## 2016-06-18 DIAGNOSIS — B954 Other streptococcus as the cause of diseases classified elsewhere: Secondary | ICD-10-CM | POA: Diagnosis not present

## 2016-06-18 DIAGNOSIS — Z452 Encounter for adjustment and management of vascular access device: Secondary | ICD-10-CM | POA: Diagnosis not present

## 2016-06-18 DIAGNOSIS — I5042 Chronic combined systolic (congestive) and diastolic (congestive) heart failure: Secondary | ICD-10-CM | POA: Diagnosis not present

## 2016-06-18 NOTE — Telephone Encounter (Signed)
Called and spoke with patient's wife about his recent hospitalization for prosthetic valve endocarditis.  I was kindly notified about the circumstances by Dr. Einar Gip.   have personally reviewed the patient's transthoracic echocardiogram and agree that findings are consistent with likely vegetations on the mitral valve.  The valve appeared to be functioning normally at the time of the ECHO and by report the patient has not had any signs or symptoms of congestive heart failure.  A TEE has not been done.  I agree w/ plans outlined by Dr. Einar Gip and the Infectious Disease team.  However, I would recommend that the patient have a TEE performed prior to stopping antibiotics once the current course has been completed.  TEE should be performed sooner if his clinical condition deteriorates.  All of her questions have been answered.  Rexene Alberts, MD 06/18/2016 10:53 AM

## 2016-06-19 LAB — CULTURE, BLOOD (ROUTINE X 2)
Culture: NO GROWTH
Culture: NO GROWTH

## 2016-06-21 DIAGNOSIS — B954 Other streptococcus as the cause of diseases classified elsewhere: Secondary | ICD-10-CM | POA: Diagnosis not present

## 2016-06-21 DIAGNOSIS — I110 Hypertensive heart disease with heart failure: Secondary | ICD-10-CM | POA: Diagnosis not present

## 2016-06-21 DIAGNOSIS — I330 Acute and subacute infective endocarditis: Secondary | ICD-10-CM | POA: Diagnosis not present

## 2016-06-21 DIAGNOSIS — I25810 Atherosclerosis of coronary artery bypass graft(s) without angina pectoris: Secondary | ICD-10-CM | POA: Diagnosis not present

## 2016-06-21 DIAGNOSIS — R6889 Other general symptoms and signs: Secondary | ICD-10-CM | POA: Diagnosis not present

## 2016-06-21 DIAGNOSIS — Z452 Encounter for adjustment and management of vascular access device: Secondary | ICD-10-CM | POA: Diagnosis not present

## 2016-06-21 DIAGNOSIS — C069 Malignant neoplasm of mouth, unspecified: Secondary | ICD-10-CM | POA: Diagnosis not present

## 2016-06-22 ENCOUNTER — Ambulatory Visit: Payer: Medicare Other | Admitting: Hematology and Oncology

## 2016-06-24 DIAGNOSIS — Z452 Encounter for adjustment and management of vascular access device: Secondary | ICD-10-CM | POA: Diagnosis not present

## 2016-06-24 DIAGNOSIS — C069 Malignant neoplasm of mouth, unspecified: Secondary | ICD-10-CM | POA: Diagnosis not present

## 2016-06-24 DIAGNOSIS — B954 Other streptococcus as the cause of diseases classified elsewhere: Secondary | ICD-10-CM | POA: Diagnosis not present

## 2016-06-24 DIAGNOSIS — I25810 Atherosclerosis of coronary artery bypass graft(s) without angina pectoris: Secondary | ICD-10-CM | POA: Diagnosis not present

## 2016-06-24 DIAGNOSIS — I110 Hypertensive heart disease with heart failure: Secondary | ICD-10-CM | POA: Diagnosis not present

## 2016-06-24 DIAGNOSIS — I330 Acute and subacute infective endocarditis: Secondary | ICD-10-CM | POA: Diagnosis not present

## 2016-06-25 DIAGNOSIS — R221 Localized swelling, mass and lump, neck: Secondary | ICD-10-CM | POA: Diagnosis not present

## 2016-06-25 DIAGNOSIS — I480 Paroxysmal atrial fibrillation: Secondary | ICD-10-CM | POA: Diagnosis not present

## 2016-06-25 DIAGNOSIS — I25119 Atherosclerotic heart disease of native coronary artery with unspecified angina pectoris: Secondary | ICD-10-CM | POA: Diagnosis not present

## 2016-06-25 DIAGNOSIS — I330 Acute and subacute infective endocarditis: Secondary | ICD-10-CM | POA: Diagnosis not present

## 2016-06-28 DIAGNOSIS — C069 Malignant neoplasm of mouth, unspecified: Secondary | ICD-10-CM | POA: Diagnosis not present

## 2016-06-28 DIAGNOSIS — B954 Other streptococcus as the cause of diseases classified elsewhere: Secondary | ICD-10-CM | POA: Diagnosis not present

## 2016-06-28 DIAGNOSIS — I330 Acute and subacute infective endocarditis: Secondary | ICD-10-CM | POA: Diagnosis not present

## 2016-06-28 DIAGNOSIS — I110 Hypertensive heart disease with heart failure: Secondary | ICD-10-CM | POA: Diagnosis not present

## 2016-06-28 DIAGNOSIS — I25810 Atherosclerosis of coronary artery bypass graft(s) without angina pectoris: Secondary | ICD-10-CM | POA: Diagnosis not present

## 2016-06-28 DIAGNOSIS — Z452 Encounter for adjustment and management of vascular access device: Secondary | ICD-10-CM | POA: Diagnosis not present

## 2016-06-28 DIAGNOSIS — A409 Streptococcal sepsis, unspecified: Secondary | ICD-10-CM | POA: Diagnosis not present

## 2016-06-28 DIAGNOSIS — Z79899 Other long term (current) drug therapy: Secondary | ICD-10-CM | POA: Diagnosis not present

## 2016-06-29 ENCOUNTER — Telehealth: Payer: Self-pay | Admitting: *Deleted

## 2016-06-29 ENCOUNTER — Other Ambulatory Visit: Payer: Self-pay | Admitting: *Deleted

## 2016-06-29 DIAGNOSIS — C109 Malignant neoplasm of oropharynx, unspecified: Secondary | ICD-10-CM

## 2016-06-29 NOTE — Telephone Encounter (Signed)
Pls let me know when things are scheduled and I will place scheduling msg accordingly

## 2016-06-29 NOTE — Telephone Encounter (Signed)
Oncology Nurse Navigator Documentation  Received call from patient's wife requesting RadOnc and MedOnc appts s/p his hospital DC.  She stated he has 2/27 1330 appt with cardiologist for [place] of heart/mitral valve, 3/7 1100 follow-up with same.  She understands appts will be scheduled next availables, will be contacted by respective department schedulers.  Gayleen Orem, RN, BSN, [place] [facility] Oncology Nurse [place] at [place] [phone]

## 2016-06-30 ENCOUNTER — Telehealth: Payer: Self-pay | Admitting: *Deleted

## 2016-06-30 NOTE — Telephone Encounter (Signed)
A user error has taken place: encounter opened in error, closed for administrative reasons.

## 2016-06-30 NOTE — Telephone Encounter (Signed)
Oncology Nurse Navigator Documentation  LVMM for patient wife indicating PET scheduled at [facility] 10:30 with 10:00 arrival, NPO 6 hrs prior.  Requested call back to confirm message receipt.  Gayleen Orem, RN, BSN, [place] [facility] Oncology Nurse [place] at [place] [phone]

## 2016-06-30 NOTE — Telephone Encounter (Signed)
Patient to have this test on 07/06/16- arrival time - 10 am @ [facility]- pt. To be NPO - 6 hrs, prior to test.

## 2016-06-30 NOTE — Telephone Encounter (Signed)
Oncology Nurse Navigator Documentation  Patient wife called, confirmed understanding of [place] PET 2/27 10:00 arrival, NPO status 6 hours prior.  Gayleen Orem, RN, BSN, [place] Ne[facility] Oncology Nurse [place] at [place] [phone]

## 2016-07-01 ENCOUNTER — Encounter: Payer: Self-pay | Admitting: Radiation Oncology

## 2016-07-02 DIAGNOSIS — M5136 Other intervertebral disc degeneration, lumbar region: Secondary | ICD-10-CM | POA: Diagnosis not present

## 2016-07-02 DIAGNOSIS — M545 Low back pain: Secondary | ICD-10-CM | POA: Diagnosis not present

## 2016-07-02 DIAGNOSIS — M47816 Spondylosis without myelopathy or radiculopathy, lumbar region: Secondary | ICD-10-CM | POA: Diagnosis not present

## 2016-07-05 DIAGNOSIS — I330 Acute and subacute infective endocarditis: Secondary | ICD-10-CM | POA: Diagnosis not present

## 2016-07-05 DIAGNOSIS — B954 Other streptococcus as the cause of diseases classified elsewhere: Secondary | ICD-10-CM | POA: Diagnosis not present

## 2016-07-05 DIAGNOSIS — I110 Hypertensive heart disease with heart failure: Secondary | ICD-10-CM | POA: Diagnosis not present

## 2016-07-05 DIAGNOSIS — I25810 Atherosclerosis of coronary artery bypass graft(s) without angina pectoris: Secondary | ICD-10-CM | POA: Diagnosis not present

## 2016-07-05 DIAGNOSIS — Z452 Encounter for adjustment and management of vascular access device: Secondary | ICD-10-CM | POA: Diagnosis not present

## 2016-07-05 DIAGNOSIS — C069 Malignant neoplasm of mouth, unspecified: Secondary | ICD-10-CM | POA: Diagnosis not present

## 2016-07-06 ENCOUNTER — Ambulatory Visit
Admission: RE | Admit: 2016-07-06 | Discharge: 2016-07-06 | Disposition: A | Discharge status: Home / Self Care | Payer: Medicare Other | Source: Ambulatory Visit | Attending: Radiation Oncology | Admitting: Radiation Oncology

## 2016-07-06 DIAGNOSIS — K5730 Diverticulosis of large intestine without perforation or abscess without bleeding: Secondary | ICD-10-CM | POA: Insufficient documentation

## 2016-07-06 DIAGNOSIS — N200 Calculus of kidney: Secondary | ICD-10-CM | POA: Diagnosis not present

## 2016-07-06 DIAGNOSIS — Z952 Presence of prosthetic heart valve: Secondary | ICD-10-CM | POA: Insufficient documentation

## 2016-07-06 DIAGNOSIS — M419 Scoliosis, unspecified: Secondary | ICD-10-CM | POA: Insufficient documentation

## 2016-07-06 DIAGNOSIS — J90 Pleural effusion, not elsewhere classified: Secondary | ICD-10-CM | POA: Diagnosis not present

## 2016-07-06 DIAGNOSIS — N400 Enlarged prostate without lower urinary tract symptoms: Secondary | ICD-10-CM | POA: Diagnosis not present

## 2016-07-06 DIAGNOSIS — C109 Malignant neoplasm of oropharynx, unspecified: Secondary | ICD-10-CM

## 2016-07-06 DIAGNOSIS — M5136 Other intervertebral disc degeneration, lumbar region: Secondary | ICD-10-CM | POA: Insufficient documentation

## 2016-07-06 DIAGNOSIS — I2510 Atherosclerotic heart disease of native coronary artery without angina pectoris: Secondary | ICD-10-CM | POA: Diagnosis not present

## 2016-07-06 DIAGNOSIS — M47896 Other spondylosis, lumbar region: Secondary | ICD-10-CM | POA: Diagnosis not present

## 2016-07-06 DIAGNOSIS — I517 Cardiomegaly: Secondary | ICD-10-CM | POA: Diagnosis not present

## 2016-07-06 DIAGNOSIS — C140 Malignant neoplasm of pharynx, unspecified: Secondary | ICD-10-CM | POA: Diagnosis not present

## 2016-07-06 DIAGNOSIS — Z951 Presence of aortocoronary bypass graft: Secondary | ICD-10-CM | POA: Insufficient documentation

## 2016-07-06 DIAGNOSIS — I700 Atherosclerosis of aorta: Secondary | ICD-10-CM | POA: Diagnosis not present

## 2016-07-06 DIAGNOSIS — K8020 Calculus of gallbladder without cholecystitis without obstruction: Secondary | ICD-10-CM | POA: Diagnosis not present

## 2016-07-06 LAB — GLUCOSE, CAPILLARY: GLUCOSE-CAPILLARY: 70 mg/dL (ref 65–99)

## 2016-07-06 MED ORDER — FLUDEOXYGLUCOSE F - 18 (FDG) INJECTION
13.0700 | Freq: Once | INTRAVENOUS | Status: AC | PRN
  Administered 2016-07-06: 13.07 via INTRAVENOUS

## 2016-07-07 DIAGNOSIS — I330 Acute and subacute infective endocarditis: Secondary | ICD-10-CM | POA: Diagnosis not present

## 2016-07-08 NOTE — Progress Notes (Signed)
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   Patient presented  months ago with symptoms of: He presented to [facility] FNP-C at [facility] on 06/09/16 with symptoms of increased fatigue, decreased appetite, cough, trouble swallowing, Right Neck swelling. His wife felt the lump while taking his pulse at home.   Biopsies of  revealed:  06/03/16 Oropharynx biopsy, right mass -Polypoid squamous cell carcinoma, at least in situ, with foci suspicious for superficial invasion -Neoplasm is strongly positive with p16 immunostatin.   Nutrition Status Yes No Comments  Weight changes? [x]  []  Pt weighed 183 lb today, they are concerned this may be incorrect.   Swallowing concerns? [x]  []  They deny difficulties  PEG? []  [x]     Referrals Yes No Comments  Social Work? []  [x]    Dentistry? []  [x]    Swallowing therapy? []  [x]    Nutrition? []  [x]    Med/Onc? []  [x]     Safety Issues Yes No Comments  Prior radiation? []  [x]    Pacemaker/ICD? []  [x]    Possible current pregnancy? []  [x]    Is the patient on methotrexate? []  [x]     Tobacco/Marijuana/Snuff/ETOH use: Never smoker/smokeless tobacco use. No alcohol use.   Past/Anticipated interventions by otolaryngology, if any:  Dr. Benjamine Mola 06/03/16, performed biopsy.   Past/Anticipated interventions by medical oncology, if any:    Current Complaints / other details:   Admitted on 06/11/2016, with complaint of confusion and fever, was found to have sepsis from unknown source. Currently further plan is continue IV antibiotics and further workup.  IMPRESSION: 1. 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 There is associated hypermetabolic bilateral station IIa and left station III adenopathy. 2. There is a small focus of  hypermetabolic activity along the posterior inferior endplate of L5. I suspect that this is probably due to an active Schmorl's node given the adjacent suspected disc protrusion. Strictly speaking, a small bony metastatic lesion, while seemingly unlikely, is not readily excluded. Given the degree of lumbar spondylosis, scoliosis, and degenerative disc disease, lumbar spine MRI should be considered both to rule out osseous metastatic disease and also characterize the degree of impingement. 3. Other imaging findings of potential clinical significance: Mild cardiomegaly. Coronary, aortic arch, and branch vessel atherosclerotic vascular disease. Prior CABG and prosthetic mitral valve. Bilateral pleural effusions. Small loculated component of the right pleural effusion extends and the right third intercostal space in between the pectoralis muscles, possibly related to a prior thoracotomy. Cholelithiasis. Bilateral nonobstructive nephrolithiasis. Enlarged prostate gland. Descending and sigmoid colon diverticulosis.  BP (!) 147/93   Pulse 81   Temp 98.4 F (36.9 C)   Ht 5\' 10"  (1.778 m)   Wt 183 lb (83 kg)   SpO2 97% Comment: room air  BMI 26.26 kg/m    Wt Readings from Last 3 Encounters:  07/13/16 183 lb (83 kg)  06/16/16 163 lb 5.8 oz (74.1 kg)  02/16/16 180 lb (81.6 kg)

## 2016-07-12 DIAGNOSIS — I110 Hypertensive heart disease with heart failure: Secondary | ICD-10-CM | POA: Diagnosis not present

## 2016-07-12 DIAGNOSIS — Z452 Encounter for adjustment and management of vascular access device: Secondary | ICD-10-CM | POA: Diagnosis not present

## 2016-07-12 DIAGNOSIS — I330 Acute and subacute infective endocarditis: Secondary | ICD-10-CM | POA: Diagnosis not present

## 2016-07-12 DIAGNOSIS — I25810 Atherosclerosis of coronary artery bypass graft(s) without angina pectoris: Secondary | ICD-10-CM | POA: Diagnosis not present

## 2016-07-12 DIAGNOSIS — C069 Malignant neoplasm of mouth, unspecified: Secondary | ICD-10-CM | POA: Diagnosis not present

## 2016-07-12 DIAGNOSIS — B954 Other streptococcus as the cause of diseases classified elsewhere: Secondary | ICD-10-CM | POA: Diagnosis not present

## 2016-07-13 ENCOUNTER — Encounter: Payer: Self-pay | Admitting: Radiation Oncology

## 2016-07-13 ENCOUNTER — Ambulatory Visit
Admission: RE | Admit: 2016-07-13 | Discharge: 2016-07-13 | Disposition: A | Discharge status: Home / Self Care | Payer: Medicare Other | Source: Ambulatory Visit | Attending: Radiation Oncology | Admitting: Radiation Oncology

## 2016-07-13 ENCOUNTER — Encounter: Payer: Self-pay | Admitting: *Deleted

## 2016-07-13 VITALS — BP 147/93 | HR 81 | Temp 98.4°F | Ht 70.0 in | Wt 183.0 lb

## 2016-07-13 DIAGNOSIS — Z951 Presence of aortocoronary bypass graft: Secondary | ICD-10-CM | POA: Insufficient documentation

## 2016-07-13 DIAGNOSIS — C01 Malignant neoplasm of base of tongue: Secondary | ICD-10-CM | POA: Diagnosis not present

## 2016-07-13 DIAGNOSIS — I10 Essential (primary) hypertension: Secondary | ICD-10-CM | POA: Diagnosis not present

## 2016-07-13 DIAGNOSIS — Z86711 Personal history of pulmonary embolism: Secondary | ICD-10-CM | POA: Diagnosis not present

## 2016-07-13 DIAGNOSIS — C029 Malignant neoplasm of tongue, unspecified: Secondary | ICD-10-CM

## 2016-07-13 DIAGNOSIS — M353 Polymyalgia rheumatica: Secondary | ICD-10-CM | POA: Insufficient documentation

## 2016-07-13 DIAGNOSIS — Z87442 Personal history of urinary calculi: Secondary | ICD-10-CM | POA: Diagnosis not present

## 2016-07-13 DIAGNOSIS — E7800 Pure hypercholesterolemia, unspecified: Secondary | ICD-10-CM | POA: Diagnosis not present

## 2016-07-13 DIAGNOSIS — I2510 Atherosclerotic heart disease of native coronary artery without angina pectoris: Secondary | ICD-10-CM | POA: Insufficient documentation

## 2016-07-13 DIAGNOSIS — I4891 Unspecified atrial fibrillation: Secondary | ICD-10-CM | POA: Insufficient documentation

## 2016-07-13 DIAGNOSIS — M797 Fibromyalgia: Secondary | ICD-10-CM | POA: Insufficient documentation

## 2016-07-13 DIAGNOSIS — Z888 Allergy status to other drugs, medicaments and biological substances status: Secondary | ICD-10-CM | POA: Insufficient documentation

## 2016-07-13 DIAGNOSIS — Z952 Presence of prosthetic heart valve: Secondary | ICD-10-CM | POA: Insufficient documentation

## 2016-07-13 DIAGNOSIS — Z79899 Other long term (current) drug therapy: Secondary | ICD-10-CM | POA: Diagnosis not present

## 2016-07-13 DIAGNOSIS — C028 Malignant neoplasm of overlapping sites of tongue: Secondary | ICD-10-CM | POA: Diagnosis not present

## 2016-07-13 MED ORDER — LARYNGOSCOPY SOLUTION RAD-ONC
15.0000 mL | Freq: Once | TOPICAL | Status: AC
  Administered 2016-07-13: 15 mL via TOPICAL
  Filled 2016-07-13: qty 15

## 2016-07-13 NOTE — Progress Notes (Signed)
Oncology Nurse Navigator Documentation  Met with Mr. Vanderpol during initial consult with Dr. Isidore Moos.  He was accompanied by his wife.   1. Further introduced myself as his Navigator, explained my role as a member of the Care Team.   2. Provided New Patient Information packet, discussed contents:  Contact information for physician(s), myself, other members of the Care Team.  Advance Directive information ([place] blue pamphlet with LCSW contact info), provided copy of [place] Advanced Directives.  Fall Prevention Patient Safety Plan  Appointment Guideline  Financial Assistance Information sheet  [place] with highlight of [place] 3. Provided introductory explanation of radiation treatment including SIM planning and purpose of Aquaplast head and shoulder mask, showed them example.   4. Provided and discussed education handout for PEG.  5. He underwent laryngoscopy with which I assisted. 6. Provided a tour of SIM and Tomo areas, explained treatment and arrival procedures. 7. Showed them location of Dental Medicine for pending appt. 8. I encouraged them to contact me with questions/concerns as treatments/procedures begin.  They verbalized understanding of information provided.    Gayleen Orem, RN, BSN, [place] [facility] Nurse [place] [facility] [place] [phone]

## 2016-07-14 DIAGNOSIS — R221 Localized swelling, mass and lump, neck: Secondary | ICD-10-CM | POA: Diagnosis not present

## 2016-07-14 DIAGNOSIS — I330 Acute and subacute infective endocarditis: Secondary | ICD-10-CM | POA: Diagnosis not present

## 2016-07-14 DIAGNOSIS — I25119 Atherosclerotic heart disease of native coronary artery with unspecified angina pectoris: Secondary | ICD-10-CM | POA: Diagnosis not present

## 2016-07-14 DIAGNOSIS — I480 Paroxysmal atrial fibrillation: Secondary | ICD-10-CM | POA: Diagnosis not present

## 2016-07-14 NOTE — Progress Notes (Signed)
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 male who presented with a palpable mass in his right upper neck in January which his wife incidentally felt while checking his pulse. The patient saw Dr. Benjamine Mola who performed a biopsy in his office on 06/03/16. The biopsy showed at least in situ polypoid squamous cell carcinoma with foci suspicious for superficial invasion. This was strongly positive for p16.  The patient has multiple comobidities including a prosthetic mitral valve, mitral regurgitation, CABGx3. 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 During his hospitalization, he underwent IV antibiotics and followed by infectious disease and cardiology. He was discharged on 06/17/16. PICC line still in place.  He underwent pertinent imaging in the form of a CT of the neck with contrast on 06/08/16. This revealed a 3.1 cm right base of tongue lesion effacing the right vallecula. The mass crossed midline with thickening of the glossal and pharyngeal epiglottic folds. It invaded the right floor of mouth muscles. Evidence of bilateral neck adenopathy. PET scan on 07/06/16 revealed no evidence of distant metastases. There was an L5 lesion that is favored to be benign. He has bilateral hypermetabolic neck nodes and a large right tongue base mass involving the supraglottis.  PREVIOUS RADIATION THERAPY: No  PAST MEDICAL HISTORY:  has a past medical  history of Acute CHF (congestive heart failure) ([place]) (11/01/2015); Acute on chronic diastolic heart failure ([place]) (09/27/2015); Acute pulmonary embolus ([place]) (2010); Acute respiratory failure with hypoxia ([place]) (09/09/2015); Arthritis; Atrial fibrillation ([place]); Atrial flutter ([place]); CHF (congestive heart failure) ([place]); Coronary artery disease involving native coronary artery (05/15/2015); Dysrhythmia; Essential hypertension; Fibromyalgia; History of kidney stones; Hypercholesterolemia; Hypertension; Lupus anticoagulant positive (07/29/2008); Mitral regurgitation (10/30/2015); PMR (polymyalgia rheumatica) ([place]); Pneumonia; 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    PAST SURGICAL HISTORY: Past Surgical History:  Procedure Laterality Date  . APPENDECTOMY    . arm surgery  Right    wrist and elbow  . CARDIAC CATHETERIZATION N/A 05/15/2015   Procedure: Left Heart Cath and Coronary Angiography;  Surgeon: Peter M Martinique, MD;  Location: [facility];  Service: Cardiovascular;  Laterality: N/A;  . CARDIAC CATHETERIZATION N/A 11/17/2015   Procedure: Right/Left Heart Cath and Coronary/Graft Angiography;  Surgeon: Adrian Prows, MD;  Location: [facility];  Service: Cardiovascular;  Laterality: N/A;  . CARDIOVERSION N/A 09/11/2015   Procedure: CARDIOVERSION;  Surgeon: Lelon Perla, MD;  Location: [facility];  Service: Cardiovascular;  Laterality: N/A;  . CHEST TUBE INSERTION  11/21/2015   Procedure: LEFT CHEST TUBE INSERTION;  Surgeon: Rexene Alberts, MD;  Location: [facility];  Service: Open Heart Surgery;;  . CLIPPING OF ATRIAL APPENDAGE  05/29/2015   Procedure: CLIPPING OF LEFT ATRIAL APPENDAGE;  Surgeon: Rexene Alberts, MD;  Location: [facility];  Service: Open Heart Surgery;;  45 AtriClip PRO 145  . CORONARY ARTERY BYPASS GRAFT N/A 05/29/2015   Procedure: CORONARY  ARTERY BYPASS GRAFTING (CABG), ON PUMP, TIMES THREE, USING LEFT INTERNAL MAMMARY ARTERY, BILATERAL GREATER SAPHENOUS VEINS HARVESTED ENDOSCOPICALLY;  Surgeon: Rexene Alberts, MD;  Location: [facility];  Service: Open Heart Surgery;  Laterality: N/A;  . FRACTURE SURGERY     right wrist  . HERNIA REPAIR    . MAZE N/A 05/29/2015   Procedure: MAZE;  Surgeon: Rexene Alberts, MD;  Location: [place];  Service: Open Heart Surgery;  Laterality: N/A;  Complete Bi-Atrial Lesion set with Ablation and Cryothermy  . MITRAL VALVE REPAIR Right 11/21/2015   Procedure: MINIMALLY INVASIVE REOPERATION FOR MITRAL VALVE REPAIR;  Surgeon: Rexene Alberts, MD;  Location: [place];  Service: Open Heart Surgery;  Laterality: Right;  . PERIPHERAL VASCULAR CATHETERIZATION Bilateral 11/17/2015   Procedure: Renal Angiography;  Surgeon: Adrian Prows, MD;  Location: [facility];  Service: Cardiovascular;  Laterality: Bilateral;  . PERIPHERAL VASCULAR CATHETERIZATION N/A 11/17/2015   Procedure: Abdominal Aortogram;  Surgeon: Adrian Prows, MD;  Location: [place] [facility];  Service: Cardiovascular;  Laterality: N/A;  . SHOULDER SURGERY Left    clavicular fracture  . TEE WITHOUT CARDIOVERSION N/A 05/29/2015   Procedure: TRANSESOPHAGEAL ECHOCARDIOGRAM (TEE);  Surgeon: Rexene Alberts, MD;  Location: [facility];  Service: Open Heart Surgery;  Laterality: N/A;  . TEE WITHOUT CARDIOVERSION N/A 10/30/2015   Procedure: TRANSESOPHAGEAL ECHOCARDIOGRAM (TEE);  Surgeon: Adrian Prows, MD;  Location: [facility];  Service: Cardiovascular;  Laterality: N/A;  . TEE WITHOUT CARDIOVERSION N/A 11/21/2015   Procedure: TRANSESOPHAGEAL ECHOCARDIOGRAM (TEE);  Surgeon: Rexene Alberts, MD;  Location: [place] [facility];  Service: Open Heart Surgery;  Laterality: N/A;    FAMILY HISTORY: family history includes Cancer in his mother; Other in his sister and sister.  SOCIAL HISTORY:  reports that he has never smoked. He has never used smokeless tobacco. He reports that he does not  drink alcohol or use drugs.  ALLERGIES: Amitriptyline and Serotonin reuptake inhibitors (ssris)  MEDICATIONS:  Current Outpatient Prescriptions  Medication Sig Dispense Refill  . acetaminophen (TYLENOL) 500 MG tablet Take 1,000 mg by mouth every 8 (eight) hours as needed for mild pain or moderate pain.    . cefTRIAXone (ROCEPHIN) IVPB Inject 2 g into the vein every 12 (twelve) hours. Indication:  Endocarditis + Bacteremia Last Day of Therapy:  07/25/16 Labs - Once weekly:  CBC/D and BMP, Labs - Every other week:  ESR and CRP 78 Units 0  . diazepam (VALIUM) 10 MG tablet Take 10 mg by mouth at bedtime as needed for anxiety.    . Multiple Vitamin (MULTIVITAMIN) tablet Take 1 tablet by mouth daily.    Marland Kitchen oxyCODONE-acetaminophen (PERCOCET) 7.5-325 MG tablet Take 2 tablets by mouth every 6 (six) hours as needed for severe pain.    . rivaroxaban (XARELTO) 20 MG TABS tablet Take 1 tablet (20 mg total) by mouth daily with supper. 30 tablet 2  . tiZANidine (ZANAFLEX) 4 MG capsule Take 4 mg by mouth 3 (three) times daily. 1/2- 1  tablet at bedtime.    . valsartan (DIOVAN) 160 MG tablet Take 1 tablet (160 mg total) by mouth daily.    Marland Kitchen zolpidem (AMBIEN) 10 MG tablet Take 10 mg by mouth daily.     Marland Kitchen amLODipine (NORVASC) 10 MG tablet Take 10 mg by mouth daily.    . Ascorbic Acid (VITAMIN C) 100  MG tablet Take 100 mg by mouth daily.    Marland Kitchen aspirin 325 MG tablet Take 1 tablet (325 mg total) by mouth daily. (Patient not taking: Reported on 07/13/2016)    . atorvastatin (LIPITOR) 40 MG tablet Take 1 tablet (40 mg total) by mouth daily at 6 PM. (Patient not taking: Reported on 07/13/2016)    . metoprolol succinate (TOPROL-XL) 25 MG 24 hr tablet Take 0.5 tablets (12.5 mg total) by mouth daily. (Patient not taking: Reported on 07/13/2016) 90 tablet 1  . predniSONE (DELTASONE) 10 MG tablet Take 1 tablet (10 mg total) by mouth daily. (Patient not taking: Reported on 07/13/2016) 30 tablet 0  . QUEtiapine (SEROQUEL) 25 MG  tablet Take 1 tablet (25 mg total) by mouth at bedtime. (Patient not taking: Reported on 07/13/2016) 30 tablet 2   No current facility-administered medications for this encounter.     REVIEW OF SYSTEMS:  Notable for that above in the HPI.   PHYSICAL EXAM:  height is 5' 10"  (1.778 m) and weight is 183 lb (83 kg). His temperature is 98.4 F (36.9 C). His blood pressure is 147/93 (abnormal) and his pulse is 81. His oxygen saturation is 97%.   General: Chronically ill appearing gentleman. However, I would not describe him as frail. HEENT: Head is normocephalic. Extraocular movements are intact. He has a "hot potato" voice. Oropharynx is notable for an exophytic mass arising from the right base of tongue. Many teeth still intact. Tongue deviates to the right. Neck: Neck is notable for a palpable 3 cm level II mass on the right. Heart: Heart murmur loudest in the mitral region. Regular in rate, but irregular in rhythm. Chest: Clear to auscultation bilaterally, with no rhonchi, wheezes, or rales. Abdomen: Soft, nontender, nondistended, with no rigidity or guarding. Extremities: Lower extremity edema. Lymphatics: see Neck Exam Skin: No concerning lesions. Musculoskeletal: symmetric strength and muscle tone throughout. Neurologic: Cranial nerves II through XII are grossly intact. No obvious focalities. Speech is fluent. Coordination is intact. Psychiatric: Judgment and insight are intact. Affect is appropriate.  PROCEDURE NOTE: After taking consent and anesthetizing the nasal cavity with topical lidocaine and phenylephrine, the flexible endoscope was introduced and passed through the nasal cavity. There is a large mass arising from the right base of tongue, involving the epiglottis and virtually the entire right aryepiglottic fold. There does not appear to be involvement of the true vocal cords.  ECOG = 2  0 - Asymptomatic (Fully active, able to carry on all predisease activities without  restriction)  1 - Symptomatic but completely ambulatory (Restricted in physically strenuous activity but ambulatory and able to carry out work of a light or sedentary nature. For example, light housework, office work)  2 - Symptomatic, <50% in bed during the day (Ambulatory and capable of all self care but unable to carry out any work activities. Up and about more than 50% of waking hours)  3 - Symptomatic, >50% in bed, but not bedbound (Capable of only limited self-care, confined to bed or chair 50% or more of waking hours)  4 - Bedbound (Completely disabled. Cannot carry on any self-care. Totally confined to bed or chair)  5 - Death   Eustace Pen MM, Creech RH, Tormey DC, et al. [phone]). "Toxicity and response criteria of the [facility]". [place] Oncol. 5 (6): 649-55   LABORATORY DATA:  Lab Results  Component Value Date   WBC 19.6 (H) 06/17/2016   HGB 11.7 (L) 06/17/2016  HCT 34.4 (L) 06/17/2016   MCV 92.7 06/17/2016   PLT 131 (L) 06/17/2016   CMP     Component Value Date/Time   NA 135 06/17/2016 0427   K 3.2 (L) 06/17/2016 0427   CL 97 (L) 06/17/2016 0427   CO2 27 06/17/2016 0427   GLUCOSE 94 06/17/2016 0427   BUN 17 06/17/2016 0427   CREATININE 0.99 06/17/2016 0427   CREATININE 1.25 10/14/2015 1127   CALCIUM 8.8 (L) 06/17/2016 0427   PROT 5.9 (L) 06/13/2016 0312   ALBUMIN 2.5 (L) 06/13/2016 0312   AST 34 06/13/2016 0312   ALT 24 06/13/2016 0312   ALKPHOS 43 06/13/2016 0312   BILITOT 0.4 06/13/2016 0312   GFRNONAA >60 06/17/2016 0427   GFRAA >60 06/17/2016 0427         RADIOGRAPHY: Nm Pet Image Initial (pi) Skull Base To Thigh  Result Date: 07/06/2016 CLINICAL DATA:  Initial treatment strategy for are pharyngeal squamous cell carcinoma. EXAM: NUCLEAR MEDICINE PET SKULL BASE TO THIGH TECHNIQUE: 13.1 mCi F-18 FDG was injected intravenously. Full-ring PET imaging was performed from the skull base to thigh after the radiotracer. CT data was  obtained and used for attenuation correction and anatomic localization. FASTING BLOOD GLUCOSE:  Value: 70 mg/dl COMPARISON:  None. FINDINGS: NECK Along the right posterior tongue base and extending into the pharyngoepiglottic fold and epiglottis on the right side, and also into the glossoepiglottic fold and crossing the midline, there is a ill-defined mass lesion with maximum standard uptake value 12.4. This extends along the upper margin of the hyoid bone but is not obviously destroying the hyoid bone. Along the anterior margin the right sternocleidomastoid muscle there is an ill-defined focus of hypermetabolic activity corresponding to a station IIa node of approximately 1.1 cm in short axis on image 31/3, maximum SUV 12.4. A larger low-density node above this in station IIa measures 2.1 cm in short axis on image 25/3 but has only low-grade metabolic activity with maximum SUV of about 5.0. A left-sided station IIa lymph node measuring 1.2 cm in short axis on image 36/3 has a maximum standard uptake value of 6.2. This is right about at the level of the hyoid. A left level III lymph node measuring 1.4 cm on image 44/3 has a maximum SUV of 5.9. CHEST Faintly accentuated metabolic activity along coronary stent, thought to be incidental. No hypermetabolic adenopathy in the chest. Mild cardiomegaly noted. Prosthetic mitral valve. Prior CABG. Coronary, aortic arch, and branch vessel atherosclerotic vascular disease. Moderate right and small left pleural effusion with associated passive atelectasis. There are scattered mildly prominent lymph nodes observed in the mediastinum with regard to size, including a 1.5 cm right lower paratracheal node on image 82 of series 3. These have relatively low standard uptake value, this paratracheal node has a maximum SUV of only 2.3. Background mediastinal blood pool activity has an SUV of 2.6. There appears to be a small loculated component of the right pleural effusion extending into  the space between the right pectoralis major and minor in the third intercostal space. Numbering of the intercostal spaces is tricky as the first and second ribs are fused along parts of their length bilaterally. I suspect this appearance could be due to a prior thoracotomy. ABDOMEN/PELVIS No abnormal hypermetabolic activity within the liver, pancreas, adrenal glands, or spleen. No hypermetabolic lymph nodes in the abdomen or pelvis. Cholelithiasis. Left mid kidney 7 mm nonobstructive renal calculus. There several punctate 2-3 mm right renal calculi. No hydronephrosis  or hydroureter. Descending and sigmoid colon diverticulosis. Enlarged prostate gland indents the bladder base. SKELETON Small focus of hypermetabolic activity along the posterior margin of the L5 vertebra just to left of midline, conceivably this could be inflammatory or due to an active Schmorl's node, I suspect that there is an adjacent disc protrusion. Maximum SUV of this small focal lesion is 6.6. I am skeptical of a small metastatic lesion although strictly speaking a small metastatic lesion is not laterally excluded. There is however considerable lumbar spondylosis and some degree of scoliosis with anterolisthesis at the L4-5 level and suspected multilevel impingement. IMPRESSION: 1. 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 There is associated hypermetabolic bilateral station IIa and left station III adenopathy. 2. There is a small focus of hypermetabolic activity along the posterior inferior endplate of L5. I suspect that this is probably due to an active Schmorl's node given the adjacent suspected disc protrusion. Strictly speaking, a small bony metastatic lesion, while seemingly unlikely, is not readily excluded. Given the degree of lumbar spondylosis, scoliosis, and degenerative disc disease, lumbar  spine MRI should be considered both to rule out osseous metastatic disease and also characterize the degree of impingement. 3. Other imaging findings of potential clinical significance: Mild cardiomegaly. Coronary, aortic arch, and branch vessel atherosclerotic vascular disease. Prior CABG and prosthetic mitral valve. Bilateral pleural effusions. Small loculated component of the right pleural effusion extends and the right third intercostal space in between the pectoralis muscles, possibly related to a prior thoracotomy. Cholelithiasis. Bilateral nonobstructive nephrolithiasis. Enlarged prostate gland. Descending and sigmoid colon diverticulosis. Electronically Signed   By: Van Clines M.D.   On: 07/06/2016 14:46      IMPRESSION/PLAN: This is a delightful patient with head and neck cancer. I do recommend radiotherapy for this patient.  We discussed the potential risks, benefits, and side effects of radiotherapy. We talked in detail about acute and late effects. We discussed that some of the most bothersome acute effects may be mucositis, dysgeusia, salivary changes, skin irritation, hair loss, dehydration, weight loss and fatigue. We talked about late effects which include but are not necessarily limited to dysphagia, hypothyroidism, nerve injury, spinal cord injury, xerostomia, trismus, and neck edema. No guarantees of treatment were given. A consent form was signed and placed in the patient's medical record. The patient is enthusiastic about proceeding with treatment. I look forward to participating in the patient's care.    Simulation (treatment planning) will take place after clearance by dentistry.  We also discussed that the treatment of head and neck cancer is a multidisciplinary process to maximize treatment outcomes and quality of life. For this reasons the following referrals have been or will be made:   Medical oncology to discuss chemotherapy and /or provide supportive care     Dentistry for dental evaluation, possible extractions in the radiation fields, and /or advice on reducing risk of cavities, osteoradionecrosis, or other oral issues.   Nutritionist for nutrition support during and after treatment.   Speech language pathology for swallowing and/or speech therapy.   Social work for social support.    Physical therapy due to risk of lymphedema in neck and deconditioning.   PEG tube to prevent malnutrition.   MBSS due to epiglottic involvement by tumor for baseline swallowing function.  I spent over 50 minutes face to face with the patient during the consultation, over 50% on counseling and  coordination of care. __________________________________________   Eppie Gibson, MD  This document serves as a record of services personally performed by Eppie Gibson, MD. It was created on her behalf by Darcus Austin, a trained medical scribe. The creation of this record is based on the scribe's personal observations and the provider's statements to them. This document has been checked and approved by the attending provider.

## 2016-07-16 ENCOUNTER — Telehealth: Payer: Self-pay | Admitting: Pharmacist

## 2016-07-16 ENCOUNTER — Other Ambulatory Visit: Payer: Self-pay | Admitting: Internal Medicine

## 2016-07-16 MED ORDER — ONDANSETRON 8 MG PO TBDP: 8.0000 mg | ORAL_TABLET | Freq: Three times a day (TID) | ORAL | 1 refills | Status: AC | PRN

## 2016-07-16 MED ORDER — ONDANSETRON 8 MG PO TBDP: 8.0000 mg | ORAL_TABLET | Freq: Three times a day (TID) | ORAL | 1 refills | Status: DC | PRN

## 2016-07-16 NOTE — Telephone Encounter (Signed)
Jeani Hawking, [facility] pharmacist, called and stated that Peter Bruce's wife called and said he has been having face, eye, leg, and feet swelling x 1 week.  He has also been hot/cold on and off, nausea, and no appetite.  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  He follows up with Dr. Baxter Flattery.  Per Dr. Baxter Flattery, he is to go to his PCP or urgent care to be evaluated for an allergic reaction.  No rash or tongue swelling reported.  Relayed information to [place] and he will call patient and tell him.

## 2016-07-18 ENCOUNTER — Telehealth: Payer: Self-pay | Admitting: Infectious Diseases

## 2016-07-18 NOTE — Telephone Encounter (Signed)
Paged by operator to call Mr Mizzell.  I called his # twice with no answer.  Left voice mail asking them to call hospital operator [phone] and hold for my return call.  Also called sister's listed number with no answer.

## 2016-07-19 ENCOUNTER — Other Ambulatory Visit (HOSPITAL_COMMUNITY): Payer: Medicare Other | Admitting: Dentistry

## 2016-07-19 ENCOUNTER — Inpatient Hospital Stay: Payer: Medicare Other | Admitting: Internal Medicine

## 2016-07-19 ENCOUNTER — Emergency Department (HOSPITAL_COMMUNITY): Payer: Medicare Other

## 2016-07-19 ENCOUNTER — Telehealth: Payer: Self-pay | Admitting: *Deleted

## 2016-07-19 ENCOUNTER — Inpatient Hospital Stay (HOSPITAL_COMMUNITY)
Admission: EM | Admit: 2016-07-19 | Discharge: 2016-07-23 | DRG: 193 | Disposition: A | Discharge status: Home / Self Care | Payer: Medicare Other | Attending: Internal Medicine | Admitting: Internal Medicine

## 2016-07-19 ENCOUNTER — Encounter (HOSPITAL_COMMUNITY): Payer: Self-pay | Admitting: Emergency Medicine

## 2016-07-19 DIAGNOSIS — I4892 Unspecified atrial flutter: Secondary | ICD-10-CM | POA: Diagnosis present

## 2016-07-19 DIAGNOSIS — Z809 Family history of malignant neoplasm, unspecified: Secondary | ICD-10-CM

## 2016-07-19 DIAGNOSIS — I639 Cerebral infarction, unspecified: Secondary | ICD-10-CM | POA: Diagnosis not present

## 2016-07-19 DIAGNOSIS — R410 Disorientation, unspecified: Secondary | ICD-10-CM | POA: Diagnosis not present

## 2016-07-19 DIAGNOSIS — D6859 Other primary thrombophilia: Secondary | ICD-10-CM | POA: Diagnosis present

## 2016-07-19 DIAGNOSIS — C01 Malignant neoplasm of base of tongue: Secondary | ICD-10-CM | POA: Diagnosis present

## 2016-07-19 DIAGNOSIS — J9601 Acute respiratory failure with hypoxia: Secondary | ICD-10-CM | POA: Diagnosis not present

## 2016-07-19 DIAGNOSIS — R221 Localized swelling, mass and lump, neck: Secondary | ICD-10-CM | POA: Diagnosis not present

## 2016-07-19 DIAGNOSIS — N183 Chronic kidney disease, stage 3 (moderate): Secondary | ICD-10-CM | POA: Diagnosis present

## 2016-07-19 DIAGNOSIS — Z888 Allergy status to other drugs, medicaments and biological substances status: Secondary | ICD-10-CM

## 2016-07-19 DIAGNOSIS — I5032 Chronic diastolic (congestive) heart failure: Secondary | ICD-10-CM | POA: Diagnosis not present

## 2016-07-19 DIAGNOSIS — I130 Hypertensive heart and chronic kidney disease with heart failure and stage 1 through stage 4 chronic kidney disease, or unspecified chronic kidney disease: Secondary | ICD-10-CM | POA: Diagnosis present

## 2016-07-19 DIAGNOSIS — I330 Acute and subacute infective endocarditis: Secondary | ICD-10-CM

## 2016-07-19 DIAGNOSIS — Z8679 Personal history of other diseases of the circulatory system: Secondary | ICD-10-CM | POA: Diagnosis not present

## 2016-07-19 DIAGNOSIS — R5383 Other fatigue: Secondary | ICD-10-CM | POA: Diagnosis not present

## 2016-07-19 DIAGNOSIS — D72829 Elevated white blood cell count, unspecified: Secondary | ICD-10-CM | POA: Diagnosis present

## 2016-07-19 DIAGNOSIS — I339 Acute and subacute endocarditis, unspecified: Secondary | ICD-10-CM | POA: Diagnosis not present

## 2016-07-19 DIAGNOSIS — T826XXD Infection and inflammatory reaction due to cardiac valve prosthesis, subsequent encounter: Secondary | ICD-10-CM | POA: Diagnosis not present

## 2016-07-19 DIAGNOSIS — Z85818 Personal history of malignant neoplasm of other sites of lip, oral cavity, and pharynx: Secondary | ICD-10-CM

## 2016-07-19 DIAGNOSIS — I76 Septic arterial embolism: Secondary | ICD-10-CM | POA: Diagnosis present

## 2016-07-19 DIAGNOSIS — E860 Dehydration: Secondary | ICD-10-CM | POA: Diagnosis present

## 2016-07-19 DIAGNOSIS — Z8249 Family history of ischemic heart disease and other diseases of the circulatory system: Secondary | ICD-10-CM

## 2016-07-19 DIAGNOSIS — M797 Fibromyalgia: Secondary | ICD-10-CM | POA: Diagnosis present

## 2016-07-19 DIAGNOSIS — Z8673 Personal history of transient ischemic attack (TIA), and cerebral infarction without residual deficits: Secondary | ICD-10-CM | POA: Diagnosis not present

## 2016-07-19 DIAGNOSIS — M353 Polymyalgia rheumatica: Secondary | ICD-10-CM | POA: Diagnosis present

## 2016-07-19 DIAGNOSIS — I10 Essential (primary) hypertension: Secondary | ICD-10-CM | POA: Diagnosis present

## 2016-07-19 DIAGNOSIS — B955 Unspecified streptococcus as the cause of diseases classified elsewhere: Secondary | ICD-10-CM | POA: Diagnosis present

## 2016-07-19 DIAGNOSIS — N179 Acute kidney failure, unspecified: Secondary | ICD-10-CM | POA: Diagnosis not present

## 2016-07-19 DIAGNOSIS — I25119 Atherosclerotic heart disease of native coronary artery with unspecified angina pectoris: Secondary | ICD-10-CM | POA: Diagnosis present

## 2016-07-19 DIAGNOSIS — R7881 Bacteremia: Secondary | ICD-10-CM | POA: Diagnosis present

## 2016-07-19 DIAGNOSIS — N200 Calculus of kidney: Secondary | ICD-10-CM | POA: Diagnosis present

## 2016-07-19 DIAGNOSIS — C029 Malignant neoplasm of tongue, unspecified: Secondary | ICD-10-CM | POA: Diagnosis not present

## 2016-07-19 DIAGNOSIS — Z7982 Long term (current) use of aspirin: Secondary | ICD-10-CM

## 2016-07-19 DIAGNOSIS — I110 Hypertensive heart disease with heart failure: Secondary | ICD-10-CM | POA: Diagnosis present

## 2016-07-19 DIAGNOSIS — N5089 Other specified disorders of the male genital organs: Secondary | ICD-10-CM | POA: Diagnosis present

## 2016-07-19 DIAGNOSIS — R404 Transient alteration of awareness: Secondary | ICD-10-CM | POA: Diagnosis not present

## 2016-07-19 DIAGNOSIS — Z79899 Other long term (current) drug therapy: Secondary | ICD-10-CM

## 2016-07-19 DIAGNOSIS — Z7901 Long term (current) use of anticoagulants: Secondary | ICD-10-CM

## 2016-07-19 DIAGNOSIS — E291 Testicular hypofunction: Secondary | ICD-10-CM | POA: Diagnosis present

## 2016-07-19 DIAGNOSIS — Z820 Family history of epilepsy and other diseases of the nervous system: Secondary | ICD-10-CM | POA: Diagnosis not present

## 2016-07-19 DIAGNOSIS — Z8619 Personal history of other infectious and parasitic diseases: Secondary | ICD-10-CM | POA: Diagnosis not present

## 2016-07-19 DIAGNOSIS — Y95 Nosocomial condition: Secondary | ICD-10-CM | POA: Diagnosis present

## 2016-07-19 DIAGNOSIS — Z951 Presence of aortocoronary bypass graft: Secondary | ICD-10-CM

## 2016-07-19 DIAGNOSIS — E785 Hyperlipidemia, unspecified: Secondary | ICD-10-CM | POA: Diagnosis not present

## 2016-07-19 DIAGNOSIS — I480 Paroxysmal atrial fibrillation: Secondary | ICD-10-CM | POA: Diagnosis not present

## 2016-07-19 DIAGNOSIS — B954 Other streptococcus as the cause of diseases classified elsewhere: Secondary | ICD-10-CM

## 2016-07-19 DIAGNOSIS — Z86718 Personal history of other venous thrombosis and embolism: Secondary | ICD-10-CM | POA: Diagnosis not present

## 2016-07-19 DIAGNOSIS — N400 Enlarged prostate without lower urinary tract symptoms: Secondary | ICD-10-CM | POA: Diagnosis present

## 2016-07-19 DIAGNOSIS — N281 Cyst of kidney, acquired: Secondary | ICD-10-CM | POA: Diagnosis present

## 2016-07-19 DIAGNOSIS — J181 Lobar pneumonia, unspecified organism: Secondary | ICD-10-CM | POA: Diagnosis not present

## 2016-07-19 DIAGNOSIS — Z8701 Personal history of pneumonia (recurrent): Secondary | ICD-10-CM | POA: Diagnosis not present

## 2016-07-19 DIAGNOSIS — Z95828 Presence of other vascular implants and grafts: Secondary | ICD-10-CM | POA: Diagnosis not present

## 2016-07-19 DIAGNOSIS — I25810 Atherosclerosis of coronary artery bypass graft(s) without angina pectoris: Secondary | ICD-10-CM | POA: Diagnosis not present

## 2016-07-19 DIAGNOSIS — I38 Endocarditis, valve unspecified: Secondary | ICD-10-CM

## 2016-07-19 DIAGNOSIS — N451 Epididymitis: Secondary | ICD-10-CM

## 2016-07-19 DIAGNOSIS — Z86711 Personal history of pulmonary embolism: Secondary | ICD-10-CM | POA: Diagnosis not present

## 2016-07-19 DIAGNOSIS — M1990 Unspecified osteoarthritis, unspecified site: Secondary | ICD-10-CM | POA: Diagnosis present

## 2016-07-19 DIAGNOSIS — Z221 Carrier of other intestinal infectious diseases: Secondary | ICD-10-CM | POA: Diagnosis not present

## 2016-07-19 DIAGNOSIS — B9689 Other specified bacterial agents as the cause of diseases classified elsewhere: Secondary | ICD-10-CM | POA: Diagnosis not present

## 2016-07-19 DIAGNOSIS — J9691 Respiratory failure, unspecified with hypoxia: Secondary | ICD-10-CM | POA: Diagnosis present

## 2016-07-19 DIAGNOSIS — D638 Anemia in other chronic diseases classified elsewhere: Secondary | ICD-10-CM | POA: Diagnosis present

## 2016-07-19 DIAGNOSIS — Y712 Prosthetic and other implants, materials and accessory cardiovascular devices associated with adverse incidents: Secondary | ICD-10-CM | POA: Diagnosis not present

## 2016-07-19 DIAGNOSIS — N50819 Testicular pain, unspecified: Secondary | ICD-10-CM | POA: Diagnosis not present

## 2016-07-19 DIAGNOSIS — R112 Nausea with vomiting, unspecified: Secondary | ICD-10-CM | POA: Diagnosis not present

## 2016-07-19 DIAGNOSIS — I2510 Atherosclerotic heart disease of native coronary artery without angina pectoris: Secondary | ICD-10-CM | POA: Diagnosis not present

## 2016-07-19 DIAGNOSIS — J189 Pneumonia, unspecified organism: Secondary | ICD-10-CM | POA: Diagnosis not present

## 2016-07-19 DIAGNOSIS — D696 Thrombocytopenia, unspecified: Secondary | ICD-10-CM | POA: Diagnosis present

## 2016-07-19 DIAGNOSIS — T826XXS Infection and inflammatory reaction due to cardiac valve prosthesis, sequela: Secondary | ICD-10-CM

## 2016-07-19 DIAGNOSIS — R531 Weakness: Secondary | ICD-10-CM

## 2016-07-19 DIAGNOSIS — G4730 Sleep apnea, unspecified: Secondary | ICD-10-CM | POA: Diagnosis present

## 2016-07-19 DIAGNOSIS — N433 Hydrocele, unspecified: Secondary | ICD-10-CM | POA: Diagnosis not present

## 2016-07-19 DIAGNOSIS — E7800 Pure hypercholesterolemia, unspecified: Secondary | ICD-10-CM | POA: Diagnosis present

## 2016-07-19 DIAGNOSIS — T826XXA Infection and inflammatory reaction due to cardiac valve prosthesis, initial encounter: Secondary | ICD-10-CM | POA: Diagnosis present

## 2016-07-19 DIAGNOSIS — R944 Abnormal results of kidney function studies: Secondary | ICD-10-CM | POA: Diagnosis not present

## 2016-07-19 DIAGNOSIS — I340 Nonrheumatic mitral (valve) insufficiency: Secondary | ICD-10-CM | POA: Diagnosis not present

## 2016-07-19 LAB — URINALYSIS, ROUTINE W REFLEX MICROSCOPIC
Bilirubin Urine: NEGATIVE
GLUCOSE, UA: 50 mg/dL — AB
Ketones, ur: 5 mg/dL — AB
Leukocytes, UA: NEGATIVE
NITRITE: NEGATIVE
PH: 5 (ref 5.0–8.0)
Protein, ur: 100 mg/dL — AB
SPECIFIC GRAVITY, URINE: 1.024 (ref 1.005–1.030)

## 2016-07-19 LAB — CBC WITH DIFFERENTIAL/PLATELET
BASOS PCT: 0 %
Basophils Absolute: 0 10*3/uL (ref 0.0–0.1)
EOS PCT: 0 %
Eosinophils Absolute: 0 10*3/uL (ref 0.0–0.7)
HEMATOCRIT: 31.1 % — AB (ref 39.0–52.0)
Hemoglobin: 10.3 g/dL — ABNORMAL LOW (ref 13.0–17.0)
LYMPHS ABS: 1.7 10*3/uL (ref 0.7–4.0)
Lymphocytes Relative: 6 %
MCH: 31.3 pg (ref 26.0–34.0)
MCHC: 33.1 g/dL (ref 30.0–36.0)
MCV: 94.5 fL (ref 78.0–100.0)
MONO ABS: 1.7 10*3/uL — AB (ref 0.1–1.0)
MONOS PCT: 6 %
Neutro Abs: 24.4 10*3/uL — ABNORMAL HIGH (ref 1.7–7.7)
Neutrophils Relative %: 88 %
PLATELETS: 72 10*3/uL — AB (ref 150–400)
RBC: 3.29 MIL/uL — AB (ref 4.22–5.81)
RDW: 16.7 % — AB (ref 11.5–15.5)
WBC: 27.8 10*3/uL — ABNORMAL HIGH (ref 4.0–10.5)

## 2016-07-19 LAB — COMPREHENSIVE METABOLIC PANEL
ALT: 358 U/L — AB (ref 17–63)
AST: 237 U/L — AB (ref 15–41)
Albumin: 3.3 g/dL — ABNORMAL LOW (ref 3.5–5.0)
Alkaline Phosphatase: 81 U/L (ref 38–126)
Anion gap: 8 (ref 5–15)
BILIRUBIN TOTAL: 0.9 mg/dL (ref 0.3–1.2)
BUN: 43 mg/dL — AB (ref 6–20)
CHLORIDE: 101 mmol/L (ref 101–111)
CO2: 25 mmol/L (ref 22–32)
CREATININE: 2.32 mg/dL — AB (ref 0.61–1.24)
Calcium: 8.6 mg/dL — ABNORMAL LOW (ref 8.9–10.3)
GFR, EST AFRICAN AMERICAN: 32 mL/min — AB (ref >60)
GFR, EST NON AFRICAN AMERICAN: 27 mL/min — AB (ref >60)
Glucose, Bld: 125 mg/dL — ABNORMAL HIGH (ref 65–99)
Potassium: 4.3 mmol/L (ref 3.5–5.1)
Sodium: 134 mmol/L — ABNORMAL LOW (ref 135–145)
TOTAL PROTEIN: 6.1 g/dL — AB (ref 6.5–8.1)

## 2016-07-19 LAB — APTT: APTT: 31 s (ref 24–36)

## 2016-07-19 LAB — I-STAT CG4 LACTIC ACID, ED: Lactic Acid, Venous: 2.09 mmol/L (ref 0.5–1.9)

## 2016-07-19 LAB — HEPARIN LEVEL (UNFRACTIONATED)

## 2016-07-19 LAB — PROTIME-INR
INR: 1.55
PROTHROMBIN TIME: 18.7 s — AB (ref 11.4–15.2)

## 2016-07-19 MED ORDER — SODIUM CHLORIDE 0.9 % IV BOLUS (SEPSIS)
1000.0000 mL | Freq: Once | INTRAVENOUS | Status: AC
  Administered 2016-07-19: 1000 mL via INTRAVENOUS

## 2016-07-19 MED ORDER — ACETAMINOPHEN 650 MG RE SUPP: 650.0000 mg | Freq: Four times a day (QID) | RECTAL | Status: DC | PRN

## 2016-07-19 MED ORDER — PIPERACILLIN-TAZOBACTAM 3.375 G IVPB
3.3750 g | Freq: Three times a day (TID) | INTRAVENOUS | Status: DC
  Administered 2016-07-19 – 2016-07-23 (×12): 3.375 g via INTRAVENOUS
  Filled 2016-07-19 (×13): qty 50

## 2016-07-19 MED ORDER — ACETAMINOPHEN 325 MG PO TABS
650.0000 mg | ORAL_TABLET | Freq: Four times a day (QID) | ORAL | Status: DC | PRN
  Administered 2016-07-20: 650 mg via ORAL
  Filled 2016-07-19: qty 2

## 2016-07-19 MED ORDER — ONDANSETRON HCL 4 MG/2ML IJ SOLN: 4.0000 mg | Freq: Four times a day (QID) | INTRAMUSCULAR | Status: DC | PRN

## 2016-07-19 MED ORDER — ZOLPIDEM TARTRATE 10 MG PO TABS
10.0000 mg | ORAL_TABLET | Freq: Every evening | ORAL | Status: DC | PRN
  Administered 2016-07-19 – 2016-07-20 (×2): 10 mg via ORAL
  Filled 2016-07-19 (×2): qty 1

## 2016-07-19 MED ORDER — SODIUM CHLORIDE 0.9% FLUSH
10.0000 mL | INTRAVENOUS | Status: DC | PRN
  Administered 2016-07-20 – 2016-07-23 (×3): 10 mL
  Filled 2016-07-19 (×3): qty 40

## 2016-07-19 MED ORDER — SACCHAROMYCES BOULARDII 250 MG PO CAPS
250.0000 mg | ORAL_CAPSULE | Freq: Two times a day (BID) | ORAL | Status: DC
  Administered 2016-07-19 – 2016-07-23 (×8): 250 mg via ORAL
  Filled 2016-07-19 (×8): qty 1

## 2016-07-19 MED ORDER — VANCOMYCIN HCL IN DEXTROSE 1-5 GM/200ML-% IV SOLN
1000.0000 mg | Freq: Once | INTRAVENOUS | Status: AC
  Administered 2016-07-19: 1000 mg via INTRAVENOUS
  Filled 2016-07-19: qty 200

## 2016-07-19 MED ORDER — SODIUM CHLORIDE 0.9% FLUSH
3.0000 mL | Freq: Two times a day (BID) | INTRAVENOUS | Status: DC
  Administered 2016-07-22 – 2016-07-23 (×3): 3 mL via INTRAVENOUS

## 2016-07-19 MED ORDER — HEPARIN (PORCINE) IN NACL 100-0.45 UNIT/ML-% IJ SOLN
1650.0000 [IU]/h | INTRAMUSCULAR | Status: DC
  Administered 2016-07-19: 1100 [IU]/h via INTRAVENOUS
  Administered 2016-07-20: 1650 [IU]/h via INTRAVENOUS
  Filled 2016-07-19 (×2): qty 250

## 2016-07-19 MED ORDER — VANCOMYCIN HCL IN DEXTROSE 1-5 GM/200ML-% IV SOLN
1000.0000 mg | INTRAVENOUS | Status: DC
  Administered 2016-07-20: 1000 mg via INTRAVENOUS
  Filled 2016-07-19: qty 200

## 2016-07-19 MED ORDER — PIPERACILLIN-TAZOBACTAM 3.375 G IVPB
3.3750 g | Freq: Once | INTRAVENOUS | Status: AC
  Administered 2016-07-19: 3.375 g via INTRAVENOUS
  Filled 2016-07-19: qty 50

## 2016-07-19 MED ORDER — ADULT MULTIVITAMIN W/MINERALS CH
1.0000 | ORAL_TABLET | Freq: Every day | ORAL | Status: DC
  Administered 2016-07-19 – 2016-07-23 (×5): 1 via ORAL
  Filled 2016-07-19 (×5): qty 1

## 2016-07-19 MED ORDER — SODIUM CHLORIDE 0.9 % IV SOLN
INTRAVENOUS | Status: AC
  Administered 2016-07-19 – 2016-07-20 (×2): via INTRAVENOUS

## 2016-07-19 MED ORDER — ALBUTEROL SULFATE (2.5 MG/3ML) 0.083% IN NEBU: 2.5000 mg | INHALATION_SOLUTION | RESPIRATORY_TRACT | Status: DC | PRN

## 2016-07-19 MED ORDER — ONDANSETRON HCL 4 MG PO TABS: 4.0000 mg | ORAL_TABLET | Freq: Four times a day (QID) | ORAL | Status: DC | PRN

## 2016-07-19 NOTE — ED Notes (Signed)
Abnormal lab result MD Oleta Mouse and RN have been made aware

## 2016-07-19 NOTE — H&P (Addendum)
History and Physical    Peter Bruce WGN:[id number] DOB: [birth year] DOA: 07/19/2016  PCP: Adrian Prows, MD   I have briefly reviewed patients previous medical reports in [facility].  Patient coming from: Home  Chief Complaint: Nausea, vomiting, diarrhea and generalized weakness.  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 He also has a right-sided base of tongue cancer for which he is yet to start any treatment. History was provided by patient and mostly by the spouse at bedside. A week prior to this admission, patient and spouse noted some generalized body swelling and were seen by primary cardiologist who did not feel that this was related to CHF. He was prescribed Lasix and Aldactone but took Aldactone for the first 3 days with minimal relief. 4 days prior to admission, patient started having nausea and multiple episodes of nonbloody emesis and diarrhea. No abdominal pain, fever or chills reported. Patient's primary ID M.D. was contacted and received antiemetics with resolution of nausea and vomiting. Patient has been drinking where eval but no appetite to take solid foods. Continues  to have multiple episodes of watery diarrhea without blood or mucus, generalized weakness and inability to get up and walk. Patient usually ambulates with the help of a cane. No cough, dyspnea or chest pain reported.  ED Course: Afebrile. Stable vital signs. Clinically dehydrated. Lab work significant for hemoglobin 10.3, WBC 27.8, platelets 72, BUN 43, creatinine 2.32, AST 237, AST 358 and chest x-ray shows right base consolidation consistent with pneumonia with right pleural effusion. He was initiated on IV fluids and IV vancomycin and Zosyn for healthcare associated pneumonia.  Review of Systems:  All other systems reviewed and apart from HPI, are negative.  Past Medical History:  Diagnosis Date  . Acute CHF (congestive heart failure) ([place]) 11/01/2015  . Acute on chronic diastolic heart failure ([place]) 09/27/2015  . Acute pulmonary embolus ([place]) 2010  . Acute respiratory failure with hypoxia ([place]) 09/09/2015  . Arthritis   . Atrial fibrillation [facility])    s/p inpatient DCCV 09/11/2015  . Atrial flutter ([place])   . CHF (congestive heart failure) ([place])   . Coronary artery disease involving native coronary artery 05/15/2015   multivessel  . Dysrhythmia    A-fib  . Essential hypertension   . Fibromyalgia   . History of kidney stones   . Hypercholesterolemia   . Hypertension   . Lupus anticoagulant positive 07/29/2008  . Mitral regurgitation 10/30/2015  . PMR (polymyalgia rheumatica) (HCC)   . Pneumonia   . 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 S/P Maze operation for atrial fibrillation 05/29/2015   Complete bilateral atrial lesion set  via median sternotomy using bipolar radiofrequency and cryothermy ablation with clipping of LA appendage  . S/P minimally invasive mitral valve repair 11/21/2015   Complex valvuloplasty including artificial Gore-tex neochord placement x12 with 32 mm Sorin Memo 3D ring annuloplasty via right mini  thoracotomy approach  . 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 APPENDECTOMY    . arm surgery  Right    wrist and elbow  . CARDIAC CATHETERIZATION N/A 05/15/2015   Procedure: Left Heart Cath and Coronary Angiography;  Surgeon: Peter M Martinique, MD;  Location: [place] CV LAB;  Service: Cardiovascular;  Laterality: N/A;  . CARDIAC CATHETERIZATION N/A 11/17/2015   Procedure: Right/Left Heart Cath and Coronary/Graft Angiography;  Surgeon: Adrian Prows, MD;  Location: [place] CV LAB;  Service: Cardiovascular;  Laterality: N/A;  . CARDIOVERSION N/A 09/11/2015   Procedure: CARDIOVERSION;  Surgeon: Lelon Perla, MD;  Location: [facility] ENDOSCOPY;  Service: Cardiovascular;  Laterality: N/A;  . CHEST TUBE INSERTION  11/21/2015   Procedure: LEFT CHEST TUBE INSERTION;  Surgeon: Rexene Alberts, MD;  Location: [place];  Service: Open Heart Surgery;;  . CLIPPING OF ATRIAL APPENDAGE  05/29/2015   Procedure: CLIPPING OF LEFT ATRIAL APPENDAGE;  Surgeon: Rexene Alberts, MD;  Location: [place];  Service: Open Heart Surgery;;  45 AtriClip PRO 145  . CORONARY ARTERY BYPASS GRAFT N/A 05/29/2015   Procedure: CORONARY ARTERY BYPASS GRAFTING (CABG), ON PUMP, TIMES THREE, USING LEFT INTERNAL MAMMARY ARTERY, BILATERAL GREATER SAPHENOUS VEINS HARVESTED ENDOSCOPICALLY;  Surgeon: Rexene Alberts, MD;  Location: [place];  Service: Open Heart Surgery;  Laterality: N/A;  . FRACTURE SURGERY     right wrist  . HERNIA REPAIR    . MAZE N/A 05/29/2015   Procedure: MAZE;  Surgeon: Rexene Alberts, MD;  Location: [place];  Service: Open Heart Surgery;  Laterality: N/A;  Complete Bi-Atrial Lesion set with Ablation and Cryothermy  . MITRAL VALVE REPAIR Right 11/21/2015   Procedure: MINIMALLY INVASIVE REOPERATION FOR MITRAL VALVE REPAIR;  Surgeon: Rexene Alberts, MD;  Location: [place];  Service: Open Heart Surgery;  Laterality: Right;  . PERIPHERAL VASCULAR CATHETERIZATION Bilateral  11/17/2015   Procedure: Renal Angiography;  Surgeon: Adrian Prows, MD;  Location: [place] CV LAB;  Service: Cardiovascular;  Laterality: Bilateral;  . PERIPHERAL VASCULAR CATHETERIZATION N/A 11/17/2015   Procedure: Abdominal Aortogram;  Surgeon: Adrian Prows, MD;  Location: [place] CV LAB;  Service: Cardiovascular;  Laterality: N/A;  . SHOULDER SURGERY Left    clavicular fracture  . TEE WITHOUT CARDIOVERSION N/A 05/29/2015   Procedure: TRANSESOPHAGEAL ECHOCARDIOGRAM (TEE);  Surgeon: Rexene Alberts, MD;  Location: [place];  Service: Open Heart Surgery;  Laterality: N/A;  . TEE WITHOUT CARDIOVERSION N/A 10/30/2015   Procedure: TRANSESOPHAGEAL ECHOCARDIOGRAM (TEE);  Surgeon: Adrian Prows, MD;  Location: [place];  Service: Cardiovascular;  Laterality: N/A;  . TEE WITHOUT CARDIOVERSION N/A 11/21/2015   Procedure: TRANSESOPHAGEAL ECHOCARDIOGRAM (TEE);  Surgeon: Rexene Alberts, MD;  Location: [place];  Service: Open Heart Surgery;  Laterality: N/A;    Social History  reports that he has never smoked. He has never used smokeless tobacco. He reports that he does not drink alcohol or use drugs.  Allergies  Allergen Reactions  . Amitriptyline Shortness Of Breath  . Serotonin Reuptake Inhibitors (Ssris) Nausea And Vomiting    Family History  Problem Relation Age of Onset  . Cancer Mother   .  Other Sister     chf  . Other Sister     Rosalee Kaufman     Prior to Admission medications   Medication Sig Start Date End Date Taking? Authorizing Provider  acetaminophen (TYLENOL) 500 MG tablet Take 1,000 mg by mouth every 6 (six) hours as needed for mild pain, moderate pain, fever or headache.    Yes Historical Provider, MD  cefTRIAXone (ROCEPHIN) IVPB Inject 2 g into the vein every 12 (twelve) hours. Indication:  Endocarditis + Bacteremia Last Day of Therapy:  07/25/16 Labs - Once weekly:  CBC/D and BMP, Labs - Every other week:  ESR and CRP 06/17/16  Yes Christina P Rama, MD  diazepam (VALIUM) 10 MG  tablet Take 10 mg by mouth at bedtime as needed for anxiety or sleep.    Yes Historical Provider, MD  Multiple Vitamin (MULTIVITAMIN WITH MINERALS) TABS tablet Take 1 tablet by mouth daily.   Yes Historical Provider, MD  ondansetron (ZOFRAN ODT) 8 MG disintegrating tablet Take 1 tablet (8 mg total) by mouth every 8 (eight) hours as needed for nausea or vomiting. 07/16/16  Yes Carlyle Basques, MD  oxyCODONE-acetaminophen (PERCOCET) 7.5-325 MG tablet Take 1 tablet by mouth 3 (three) times daily as needed for severe pain.   Yes Historical Provider, MD  rivaroxaban (XARELTO) 20 MG TABS tablet Take 1 tablet (20 mg total) by mouth daily with supper. 06/17/16  Yes Christina P Rama, MD  tiZANidine (ZANAFLEX) 4 MG tablet Take 2-4 mg by mouth at bedtime as needed for muscle spasms.   Yes Historical Provider, MD  valsartan (DIOVAN) 160 MG tablet Take 1 tablet (160 mg total) by mouth daily. 11/19/15  Yes Adrian Prows, MD  zolpidem (AMBIEN) 10 MG tablet Take 10 mg by mouth at bedtime as needed for sleep.    Yes Historical Provider, MD  aspirin 325 MG tablet Take 1 tablet (325 mg total) by mouth daily. Patient not taking: Reported on 07/13/2016 06/18/16   Venetia Maxon Rama, MD  atorvastatin (LIPITOR) 40 MG tablet Take 1 tablet (40 mg total) by mouth daily at 6 PM. Patient not taking: Reported on 07/13/2016 06/17/16   Venetia Maxon Rama, MD  metoprolol succinate (TOPROL-XL) 25 MG 24 hr tablet Take 0.5 tablets (12.5 mg total) by mouth daily. Patient not taking: Reported on 07/13/2016 12/15/15   Rexene Alberts, MD  predniSONE (DELTASONE) 10 MG tablet Take 1 tablet (10 mg total) by mouth daily. Patient not taking: Reported on 07/13/2016 06/17/16   Venetia Maxon Rama, MD  QUEtiapine (SEROQUEL) 25 MG tablet Take 1 tablet (25 mg total) by mouth at bedtime. Patient not taking: Reported on 07/13/2016 06/17/16   Venetia Maxon Rama, MD    Physical Exam: Vitals:   07/19/16 1230 07/19/16 1245 07/19/16 1300 07/19/16 1355  BP: (!) 139/102  128/76 135/89    Pulse:  96  94  Resp: _0 Temp:    98.2 F (36.8 C)  TempSrc:    Oral  SpO2:  98%  95%  Weight:    81.6 kg (179 lb 14.3 oz)  Height:    5' 10" (1.778 m)      Constitutional: Pleasant middle-aged male, moderately built and poorly nourished, chronically ill looking, lying comfortably propped up in the gurney in the ED. Does not appear in any distress. Eyes: PERTLA, lids and conjunctivae normal ENMT: Mucous membranes are dry. Unable to clearly visualize posterior pharynx and no obvious mass visible. Normal dentition.  Neck: supple,  no masses, no thyromegaly Respiratory: decreased breath sounds in the bases with basilar crackles right >left. Rest of lung fields clear to auscultation. Normal respiratory effort. No accessory muscle use.  Cardiovascular: S1 & S2 heard, regular rate and rhythm, no murmurs / rubs / gallops. No extremity edema. 2+ pedal pulses. No carotid bruits.  Abdomen: No distension, no tenderness, no masses palpated. No hepatosplenomegaly. Bowel sounds normal. Mild scrotal edema without acute findings.  Musculoskeletal: no clubbing / cyanosis. No joint deformity upper and lower extremities. Good ROM, no contractures. Normal muscle tone. Right upper arm PICC line without acute findings.  Skin: no rashes, lesions, ulcers. No induration Neurologic: CN 2-12 grossly intact. Sensation intact, DTR normal. Strength 5/5 in all 4 limbs.  Psychiatric: Normal judgment and insight. Alert and oriented x 3. Normal mood.     Labs on Admission: I have personally reviewed following labs and imaging studies  CBC:  Recent Labs Lab 07/19/16 1016  WBC 27.8*  NEUTROABS 24.4*  HGB 10.3*  HCT 31.1*  MCV 94.5  PLT 72*   Basic Metabolic Panel:  Recent Labs Lab 07/19/16 1016  NA 134*  K 4.3  CL 101  CO2 25  GLUCOSE 125*  BUN 43*  CREATININE 2.32*  CALCIUM 8.6*   Liver Function Tests:  Recent Labs Lab 07/19/16 1016  AST 237*  ALT 358*  ALKPHOS 81  BILITOT 0.9   PROT 6.1*  ALBUMIN 3.3*      Radiological Exams on Admission: Dg Chest 2 View  Result Date: 07/19/2016 CLINICAL DATA:  Weakness and fatigue EXAM: CHEST  2 VIEW COMPARISON:  June 11, 2016 FINDINGS: There is airspace consolidation in the right lower lobe with right pleural effusion. The left lung is clear. Heart is mildly enlarged with pulmonary venous hypertension. No adenopathy. Central catheter tip is in superior vena cava. Patient is status post coronary artery bypass grafting. There is a left atrial appendage clamp. There is degenerative change in the lower thoracic region. There is a small benign appearing cystic area in the right proximal humerus. IMPRESSION: Right base consolidation consistent with pneumonia with right pleural effusion. Left lung clear. There is pulmonary vascular congestion. No interstitial edema. Central catheter tip in superior vena cava. No evident pneumothorax. Electronically Signed   By: Lowella Grip III M.D.   On: 07/19/2016 10:23     Assessment/Plan Principal Problem:   HCAP (healthcare-associated pneumonia) Active Problems:   Paroxysmal atrial fibrillation (HCC)   Dyslipidemia   Respiratory failure with hypoxia (HCC)   Current use of long term anticoagulation   Chronic diastolic CHF (congestive heart failure) (HCC)   Essential hypertension   Cancer of base of tongue (HCC)   Streptococcal bacteremia   Prosthetic valve endocarditis (HCC)   Coronary artery disease involving coronary bypass graft of native heart without angina pectoris   Benign essential HTN   Leukocytosis   Anemia of chronic disease   AKI (acute kidney injury) ([place])   Dehydration     1. Health Care Associated Pneumonia, RLL: Possibly of symptoms related to this but chest x-ray shows. Follow repeat blood cultures. Check pro calcitonin. Continue empirically started IV vancomycin and Zosyn.  2. Diarrhea and self-limiting nausea and vomiting: Concern for infectious etiology i.e.  C. difficile given long-term IV antibiotics and marked leukocytosis. Contact isolation. Check C. difficile PCR. Add probiotics. 3. Dehydration: Secondary to poor oral intake and GI losses. IV fluids and encourage oral intake as tolerated.  4. Acute kidney injury: Recent creatinine 06/17/16:0.99. Presented  with creatinine of 2.32. Secondary to dehydration, ARB and recent diuretics. Hold culprit medications. IV fluids and follow BMP closely. 5. Streptococcal bacteremia/sepsis/prosthetic valve endocarditis/embolic CVA: Was on home IV ceftriaxone with end date of 07/25/16. This is currently held and is getting IV Zosyn for pneumonia instead. ID consulted for recommendations. Discussed with primary cardiologist/Dr. Einar Gip who indicates that recent 2-D echo in office suggests old vegetation. Follow up repeat blood cultures. 6. Paroxysmal A. fib: Clinically in sinus rhythm. Has not taken Xarelto. Has stopped taking BB's. Hold Xarelto d/t AKI and start IV heparin infusion.  7. Acute respiratory failure with hypoxia: Secondary to pneumonia. Oxygen support. Wean as tolerated. 8. Essential hypertension: Mildly uncontrolled. Resume beta blockers. Hold ARB. 9. Chronic diastolic CHF: Clinically dry. Hold diuretics. 10. Cancer base of right tongue base: Awaiting initiation of treatment as outpatient. 11. Leukocytosis: Has had this chronically during previous hospitalization but has worsened from 19.6 227.8. 12. Thrombocytopenia: Intermittent. No bleeding reported. Follow CBCs closely. 13. Anemia of chronic disease: Stable. Follow CBCs. 14. Mild transaminitis: Unclear etiology. Apart from diarrhea, has no other GI symptoms at this time. Follow LFTs in a.m. 15. PMR: Has stopped taking by himself. RN confirmed with patient/family and was told that an M.D. told him to stop taking prednisone because it may interact with another medication.   DVT prophylaxis: on IV heparin infusion per pharmacy  Code Status: Full   Family  Communication: discussed in detail with patient's spouse at bedside.   Disposition Plan: DC home when medically improved and stable.   Consults called: Infectious disease. Discussed with primary cardiologist>call as needed.   Admission status: Inpatient, telemetry.   [facility] MD [facility] Pager [phone]  If 7PM-7AM, please contact night-coverage [email or web address] Password [email or web address]  07/19/2016, 2:33 PM

## 2016-07-19 NOTE — ED Triage Notes (Signed)
Per EMS pt from home with complaint of fatigue for 4 days; new onset n/v/d for 1 day. Pt diagnosed with PICC infection 2/9 and is currently getting treatment for same.

## 2016-07-19 NOTE — Telephone Encounter (Signed)
Oncology Nurse Navigator Documentation  Received call from patient wife cancelling today's 12:30 Dental Medicine appt.   Patient has had reaction to an abx, is being taken by ambulance to ED.  Dr. Enrique Sack and dental assistant Lattie Haw notified.  Gayleen Orem, RN, BSN, [place] Oncology Nurse [place] [facility] [place] [phone]

## 2016-07-19 NOTE — Progress Notes (Signed)
Pharmacy Antibiotic Note  Peter Bruce is a 66 y.o. male admitted on 07/19/2016 with pneumonia.  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 History of prosthetic mitral valve and CABG. 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 Is on Xarelto. Pharmacy has been consulted for vanc and zosyn dosing.  Plan: Zosyn 3.375g IV Q8H infused over 4hrs. Vancomycin 1gm x 1 in ED then 1000mg  IV q24h Follow renal function, cultures and clinical course  Height: 5\' 10"  (177.8 cm) Weight: 179 lb 14.3 oz (81.6 kg) IBW/kg (Calculated) : 73  Temp (24hrs), Avg:98.5 F (36.9 C), Min:98.2 F (36.8 C), Max:98.7 F (37.1 C)   Recent Labs Lab 07/19/16 1016 07/19/16 1026  WBC 27.8*  --   CREATININE 2.32*  --   LATICACIDVEN  --  2.09*    Estimated Creatinine Clearance: 31.9 mL/min (by C-G formula based on SCr of 2.32 mg/dL (H)).    Allergies  Allergen Reactions  . Amitriptyline Shortness Of Breath  . Serotonin Reuptake Inhibitors (Ssris) Nausea And Vomiting    Antimicrobials this admission: 3/12 vanc >> 3/12 zosyn >>  Microbiology results: 3/12 BCx: sent 3/12 UCx: sent 3/12 CDiff: sent  Thank you for allowing pharmacy to be a part of this patient's care.  Dolly Rias RPh 07/19/2016, 2:46 PM Pager [phone]

## 2016-07-19 NOTE — ED Provider Notes (Signed)
[facility] DEPT Provider Note   CSN: [id number] Arrival date & time: 07/19/16  8250     History   Chief Complaint Chief Complaint  Patient presents with  . Fatigue  . Emesis    HPI Peter Bruce is a 66 y.o. male.  HPI 66 year old male who presents with generalized weakness and fatigue. 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 History of prosthetic mitral valve and CABG. 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 Is on Xarelto.  States that over the past 4 days he has had decreased appetite, and has not eaten or drinking anything. As a result has had generalized weakness and fatigue. Did have episode of vomiting and diarrhea yesterday. That has resolved. No dysuria or urinary frequency. No chest pain or difficulty breathing. Has had low-grade fever of 100.2 Fahrenheit at home. No cough or congestion.    Past Medical History:  Diagnosis Date  . Acute CHF (congestive heart failure) ([place]) 11/01/2015  . Acute on chronic diastolic heart failure ([place]) 09/27/2015  . Acute pulmonary embolus ([place]) 2010  . Acute respiratory failure with hypoxia ([place]) 09/09/2015  . Arthritis   . Atrial fibrillation [facility])    s/p inpatient DCCV 09/11/2015  . Atrial flutter ([place])   . CHF (congestive heart failure) ([place])   . Coronary artery disease involving native coronary artery 05/15/2015   multivessel  . Dysrhythmia    A-fib  . Essential hypertension   . Fibromyalgia   . History of kidney stones   . Hypercholesterolemia   . Hypertension   . Lupus anticoagulant positive 07/29/2008  . Mitral regurgitation 10/30/2015  . PMR (polymyalgia rheumatica) (HCC)   . Pneumonia   . 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 S/P Maze operation for atrial fibrillation 05/29/2015   Complete bilateral atrial  lesion set via median sternotomy using bipolar radiofrequency and cryothermy ablation with clipping of LA appendage  . 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 Squamous cell cancer of buccal mucosa [facility])     Patient Active Problem List   Diagnosis Date Noted  . HCAP (healthcare-associated pneumonia) 07/19/2016  . 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 Lupus erythematosus   . PMR (polymyalgia rheumatica) (HCC)   . Coronary artery disease involving coronary bypass graft of native heart without angina pectoris   . Benign essential HTN   . Tachypnea   . Agitation   . Leukocytosis   . Acute blood loss anemia   . Anemia of chronic disease   . Thrombocytopenia ([place])   . Cancer of base of tongue ([place]) 06/14/2016  . Cerebral embolism with cerebral infarction 06/14/2016  . Acute encephalopathy   . Streptococcal bacteremia   . Prosthetic valve endocarditis ([place])   . Septic embolism ([place])   . Hypercoagulable state ([place])   . Other streptococcal sepsis ([place]) 06/12/2016  . Sepsis, unspecified organism ([place]) 06/11/2016  . Hyponatremia 06/11/2016  . Normocytic anemia 06/11/2016  . S/P minimally invasive mitral valve repair 11/21/2015  . Essential hypertension   . Mitral regurgitation 10/30/2015  .  Fatigue 10/14/2015  . Current use of long term anticoagulation 09/27/2015  . Bilateral pleural effusion 09/27/2015  . Chronic diastolic CHF (congestive heart failure) ([place]) 09/27/2015  . Respiratory failure with hypoxia ([place]) 09/09/2015  . Atrial flutter ([place])   . S/P CABG x 3 and maze procedure 05/29/2015  . Coronary artery disease involving native coronary artery 05/15/2015  . Dyslipidemia 04/14/2015  . Polymyalgia rheumatica--rheumatol [place]-2016 12/19/2013  . Obesity  (BMI 30-39.9)   . BPH (benign prostatic hyperplasia) 08/30/2011  . Paroxysmal atrial fibrillation ([place]) 08/30/2011  . Chronic pain 08/30/2011  . Anxiety 04/21/2011  . History of pulmonary embolism 10/02/2008  . Lupus anticoagulant positive 07/29/2008    Past Surgical History:  Procedure Laterality Date  . APPENDECTOMY    . arm surgery  Right    wrist and elbow  . CARDIAC CATHETERIZATION N/A 05/15/2015   Procedure: Left Heart Cath and Coronary Angiography;  Surgeon: Peter M Martinique, MD;  Location: [place] CV LAB;  Service: Cardiovascular;  Laterality: N/A;  . CARDIAC CATHETERIZATION N/A 11/17/2015   Procedure: Right/Left Heart Cath and Coronary/Graft Angiography;  Surgeon: Adrian Prows, MD;  Location: [place] CV LAB;  Service: Cardiovascular;  Laterality: N/A;  . CARDIOVERSION N/A 09/11/2015   Procedure: CARDIOVERSION;  Surgeon: Lelon Perla, MD;  Location: [facility] ENDOSCOPY;  Service: Cardiovascular;  Laterality: N/A;  . CHEST TUBE INSERTION  11/21/2015   Procedure: LEFT CHEST TUBE INSERTION;  Surgeon: Rexene Alberts, MD;  Location: [place];  Service: Open Heart Surgery;;  . CLIPPING OF ATRIAL APPENDAGE  05/29/2015   Procedure: CLIPPING OF LEFT ATRIAL APPENDAGE;  Surgeon: Rexene Alberts, MD;  Location: [place];  Service: Open Heart Surgery;;  45 AtriClip PRO 145  . CORONARY ARTERY BYPASS GRAFT N/A 05/29/2015   Procedure: CORONARY ARTERY BYPASS GRAFTING (CABG), ON PUMP, TIMES THREE, USING LEFT INTERNAL MAMMARY ARTERY, BILATERAL GREATER SAPHENOUS VEINS HARVESTED ENDOSCOPICALLY;  Surgeon: Rexene Alberts, MD;  Location: [place];  Service: Open Heart Surgery;  Laterality: N/A;  . FRACTURE SURGERY     right wrist  . HERNIA REPAIR    . MAZE N/A 05/29/2015   Procedure: MAZE;  Surgeon: Rexene Alberts, MD;  Location: [place];  Service: Open Heart Surgery;  Laterality: N/A;  Complete Bi-Atrial Lesion set with Ablation and Cryothermy  . MITRAL VALVE REPAIR Right 11/21/2015   Procedure: MINIMALLY INVASIVE  REOPERATION FOR MITRAL VALVE REPAIR;  Surgeon: Rexene Alberts, MD;  Location: [place];  Service: Open Heart Surgery;  Laterality: Right;  . PERIPHERAL VASCULAR CATHETERIZATION Bilateral 11/17/2015   Procedure: Renal Angiography;  Surgeon: Adrian Prows, MD;  Location: [place] CV LAB;  Service: Cardiovascular;  Laterality: Bilateral;  . PERIPHERAL VASCULAR CATHETERIZATION N/A 11/17/2015   Procedure: Abdominal Aortogram;  Surgeon: Adrian Prows, MD;  Location: [place] CV LAB;  Service: Cardiovascular;  Laterality: N/A;  . SHOULDER SURGERY Left    clavicular fracture  . TEE WITHOUT CARDIOVERSION N/A 05/29/2015   Procedure: TRANSESOPHAGEAL ECHOCARDIOGRAM (TEE);  Surgeon: Rexene Alberts, MD;  Location: [place];  Service: Open Heart Surgery;  Laterality: N/A;  . TEE WITHOUT CARDIOVERSION N/A 10/30/2015   Procedure: TRANSESOPHAGEAL ECHOCARDIOGRAM (TEE);  Surgeon: Adrian Prows, MD;  Location: [place];  Service: Cardiovascular;  Laterality: N/A;  . TEE WITHOUT CARDIOVERSION N/A 11/21/2015   Procedure: TRANSESOPHAGEAL ECHOCARDIOGRAM (TEE);  Surgeon: Rexene Alberts, MD;  Location: [place];  Service: Open Heart Surgery;  Laterality: N/A;       Home Medications  Prior to Admission medications   Medication Sig Start Date End Date Taking? Authorizing Provider  acetaminophen (TYLENOL) 500 MG tablet Take 1,000 mg by mouth every 6 (six) hours as needed for mild pain, moderate pain, fever or headache.    Yes Historical Provider, MD  cefTRIAXone (ROCEPHIN) IVPB Inject 2 g into the vein every 12 (twelve) hours. Indication:  Endocarditis + Bacteremia Last Day of Therapy:  07/25/16 Labs - Once weekly:  CBC/D and BMP, Labs - Every other week:  ESR and CRP 06/17/16  Yes Christina P Rama, MD  diazepam (VALIUM) 10 MG tablet Take 10 mg by mouth at bedtime as needed for anxiety or sleep.    Yes Historical Provider, MD  Multiple Vitamin (MULTIVITAMIN WITH MINERALS) TABS tablet Take 1 tablet by mouth daily.   Yes Historical  Provider, MD  ondansetron (ZOFRAN ODT) 8 MG disintegrating tablet Take 1 tablet (8 mg total) by mouth every 8 (eight) hours as needed for nausea or vomiting. 07/16/16  Yes Carlyle Basques, MD  oxyCODONE-acetaminophen (PERCOCET) 7.5-325 MG tablet Take 1 tablet by mouth 3 (three) times daily as needed for severe pain.   Yes Historical Provider, MD  rivaroxaban (XARELTO) 20 MG TABS tablet Take 1 tablet (20 mg total) by mouth daily with supper. 06/17/16  Yes Christina P Rama, MD  tiZANidine (ZANAFLEX) 4 MG tablet Take 2-4 mg by mouth at bedtime as needed for muscle spasms.   Yes Historical Provider, MD  valsartan (DIOVAN) 160 MG tablet Take 1 tablet (160 mg total) by mouth daily. 11/19/15  Yes Adrian Prows, MD  zolpidem (AMBIEN) 10 MG tablet Take 10 mg by mouth at bedtime as needed for sleep.    Yes Historical Provider, MD  aspirin 325 MG tablet Take 1 tablet (325 mg total) by mouth daily. Patient not taking: Reported on 07/13/2016 06/18/16   Venetia Maxon Rama, MD  atorvastatin (LIPITOR) 40 MG tablet Take 1 tablet (40 mg total) by mouth daily at 6 PM. Patient not taking: Reported on 07/13/2016 06/17/16   Venetia Maxon Rama, MD  metoprolol succinate (TOPROL-XL) 25 MG 24 hr tablet Take 0.5 tablets (12.5 mg total) by mouth daily. Patient not taking: Reported on 07/13/2016 12/15/15   Rexene Alberts, MD  predniSONE (DELTASONE) 10 MG tablet Take 1 tablet (10 mg total) by mouth daily. Patient not taking: Reported on 07/13/2016 06/17/16   Venetia Maxon Rama, MD  QUEtiapine (SEROQUEL) 25 MG tablet Take 1 tablet (25 mg total) by mouth at bedtime. Patient not taking: Reported on 07/13/2016 06/17/16   Venetia Maxon Rama, MD    Family History Family History  Problem Relation Age of Onset  . Cancer Mother   . Other Sister     chf  . Other Sister     Rosalee Kaufman    Social History Social History  Substance Use Topics  . Smoking status: Never Smoker  . Smokeless tobacco: Never Used  . Alcohol use No     Allergies   Amitriptyline  and Serotonin reuptake inhibitors (ssris)   Review of Systems Review of Systems  Constitutional: Negative for fever.  HENT: Negative for congestion.   Respiratory: Negative for cough and shortness of breath.   Cardiovascular: Negative for chest pain.  Gastrointestinal: Negative for abdominal pain.  Genitourinary: Negative for difficulty urinating and dysuria.  Musculoskeletal: Negative for back pain.  Skin: Negative for rash.  Neurological: Negative for syncope.  Hematological: Bruises/bleeds easily (on xarelto).  Psychiatric/Behavioral: Negative for confusion.  All other systems  reviewed and are negative.    Physical Exam Updated Vital Signs BP 116/92 (BP Location: Left Arm)   Pulse 100   Temp 98.7 F (37.1 C) (Oral)   Resp 18   Ht 5' 10"  (1.778 m)   Wt 183 lb (83 kg)   SpO2 92%   BMI 26.26 kg/m   Physical Exam Physical Exam  Nursing note and vitals reviewed. Constitutional appears fatigued and listless, non-toxic, and in no acute distress Head: Normocephalic and atraumatic.  Mouth/Throat: Oropharynx is clear and mucous membranes dry.  Neck: Normal range of motion. Neck supple.  Cardiovascular: Tachcyardic rate and regular rhythm.   Pulmonary/Chest: Effort normal and breath sounds normal.  Abdominal: Soft. No distension There is no tenderness. There is no rebound and no guarding.  Musculoskeletal: Normal range of motion.  Neurological: Alert, no facial droop, fluent speech, moves all extremities symmetrically Skin: Skin is warm and dry.  Psychiatric: Cooperative   ED Treatments / Results  Labs (all labs ordered are listed, but only abnormal results are displayed) Labs Reviewed  CBC WITH DIFFERENTIAL/PLATELET - Abnormal; Notable for the following:       Result Value   WBC 27.8 (*)    RBC 3.29 (*)    Hemoglobin 10.3 (*)    HCT 31.1 (*)    RDW 16.7 (*)    Platelets 72 (*)    Neutro Abs 24.4 (*)    Monocytes Absolute 1.7 (*)    All other components within  normal limits  COMPREHENSIVE METABOLIC PANEL - Abnormal; Notable for the following:    Sodium 134 (*)    Glucose, Bld 125 (*)    BUN 43 (*)    Creatinine, Ser 2.32 (*)    Calcium 8.6 (*)    Total Protein 6.1 (*)    Albumin 3.3 (*)    AST 237 (*)    ALT 358 (*)    GFR calc non Af Amer 27 (*)    GFR calc Af Amer 32 (*)    All other components within normal limits  I-STAT CG4 LACTIC ACID, ED - Abnormal; Notable for the following:    Lactic Acid, Venous 2.09 (*)    All other components within normal limits  CULTURE, BLOOD (ROUTINE X 2)  CULTURE, BLOOD (ROUTINE X 2)  URINE CULTURE  URINALYSIS, ROUTINE W REFLEX MICROSCOPIC    EKG  EKG Interpretation None       Radiology Dg Chest 2 View  Result Date: 07/19/2016 CLINICAL DATA:  Weakness and fatigue EXAM: CHEST  2 VIEW COMPARISON:  June 11, 2016 FINDINGS: There is airspace consolidation in the right lower lobe with right pleural effusion. The left lung is clear. Heart is mildly enlarged with pulmonary venous hypertension. No adenopathy. Central catheter tip is in superior vena cava. Patient is status post coronary artery bypass grafting. There is a left atrial appendage clamp. There is degenerative change in the lower thoracic region. There is a small benign appearing cystic area in the right proximal humerus. IMPRESSION: Right base consolidation consistent with pneumonia with right pleural effusion. Left lung clear. There is pulmonary vascular congestion. No interstitial edema. Central catheter tip in superior vena cava. No evident pneumothorax. Electronically Signed   By: Lowella Grip III M.D.   On: 07/19/2016 10:23    Procedures Procedures (including critical care time)  Medications Ordered in ED Medications  piperacillin-tazobactam (ZOSYN) IVPB 3.375 g (3.375 g Intravenous New Bag/Given 07/19/16 1218)  vancomycin (VANCOCIN) IVPB 1000 mg/200 mL premix (not  administered)  sodium chloride 0.9 % bolus 1,000 mL (1,000 mLs  Intravenous New Bag/Given 07/19/16 1125)  sodium chloride 0.9 % bolus 1,000 mL (0 mLs Intravenous Stopped 07/19/16 1125)     Initial Impression / Assessment and Plan / ED Course  I have reviewed the triage vital signs and the nursing notes.  Pertinent labs & imaging results that were available during my care of the patient were reviewed by me and considered in my medical decision making (see chart for details).    With sepsis secondary to open or pneumonia. PICC line appears to be in place, without overlying cellulitis. Afebrile, but mildly tachycardic. Normotensive. With 2-3 L oxygen requirement is is mildly hypoxic in the 80 percentile on room air.  With leukocytosis of 24, and upon discharge for endocarditis it was 18-19. Does have mildly elevated lactate of 2.0, but dehydration also plays role. Chest x-ray visualized with right lobar pneumonia. With acute kidney injury and creatinine of 2.3. blood cultures obtained pending. Urine and urine cultures are pending.  Covered with vancomycin and Zosyn were healthcare acquired pneumonia. Also received 2 L of IV fluids. Discussed with Dr. Exie Parody for possible service will admit for ongoing management.  Final Clinical Impressions(s) / ED Diagnoses   Final diagnoses:  Acute kidney injury ([place])  Lobar pneumonia (HCC)  Leukocytosis, unspecified type  Dehydration  Generalized weakness    New Prescriptions New Prescriptions   No medications on file     Forde Dandy, MD 07/19/16 1223

## 2016-07-19 NOTE — Progress Notes (Signed)
ANTICOAGULATION CONSULT NOTE - Initial Consult  Pharmacy Consult for heparin drip Indication: atrial fibrillation  Allergies  Allergen Reactions  . Amitriptyline Shortness Of Breath  . Serotonin Reuptake Inhibitors (Ssris) Nausea And Vomiting    Patient Measurements: Height: 5\' 10"  (177.8 cm) Weight: 179 lb 14.3 oz (81.6 kg) IBW/kg (Calculated) : 73 Heparin Dosing Weight: 81.6  Vital Signs: Temp: 98.2 F (36.8 C) (03/12 1355) Temp Source: Oral (03/12 1355) BP: 135/89 (03/12 1355) Pulse Rate: 94 (03/12 1355)  Labs:  Recent Labs  07/19/16 1016  HGB 10.3*  HCT 31.1*  PLT 72*  CREATININE 2.32*    Estimated Creatinine Clearance: 31.9 mL/min (by C-G formula based on SCr of 2.32 mg/dL (H)).   Medical History: Past Medical History:  Diagnosis Date  . Acute CHF (congestive heart failure) ([place]) 11/01/2015  . Acute on chronic diastolic heart failure ([place]) 09/27/2015  . Acute pulmonary embolus ([place]) 2010  . Acute respiratory failure with hypoxia ([place]) 09/09/2015  . Arthritis   . Atrial fibrillation [facility])    s/p inpatient DCCV 09/11/2015  . Atrial flutter ([place])   . CHF (congestive heart failure) ([place])   . Coronary artery disease involving native coronary artery 05/15/2015   multivessel  . Dysrhythmia    A-fib  . Essential hypertension   . Fibromyalgia   . History of kidney stones   . Hypercholesterolemia   . Hypertension   . Lupus anticoagulant positive 07/29/2008  . Mitral regurgitation 10/30/2015  . PMR (polymyalgia rheumatica) (HCC)   . Pneumonia   . 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 S/P Maze operation for atrial fibrillation 05/29/2015   Complete bilateral atrial lesion set via median sternotomy using bipolar radiofrequency and cryothermy ablation with clipping of LA appendage  . S/P minimally invasive mitral valve repair 11/21/2015   Complex valvuloplasty including artificial Gore-tex neochord  placement x12 with 32 mm Sorin Memo 3D ring annuloplasty via right mini thoracotomy approach  . Squamous cell cancer of buccal mucosa [facility])      Assessment: 66 year old male who presents with generalized weakness and fatigue. 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 History of prosthetic mitral valve and CABG. 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 Is on Xarelto.  Last dose xarelto 3/9 @ 1800, pharmacy consulted to dose heparin for afib  07/19/2016 Plts 72 Scr 2.32 APTT, HL and PT/INR ordered stat  Goal of Therapy:  Heparin level 0.3-0.7 units/ml  APTT level 66-102 Monitor platelets by anticoagulation protocol   Plan:  Due to low platelets will not bolus, start heparin drip at 1100 Units/hr Check heparin/aPTT level in 8 hours Daily heparin level/CBC   Dolly Rias RPh 07/19/2016, 3:00 PM Pager [phone]

## 2016-07-19 NOTE — Consult Note (Signed)
Peter Bruce for Infectious Disease  Date of Admission:  07/19/2016  Date of Consult:  07/19/2016  Reason for Consult:Pneumonia Referring Physician: Hongalgi  Impression/Recommendation Pneumonia, HCAP Agree with treatment for HCAP Await his BCx Hopefully MS will improve  ARF Watch with hydration Hopefully Vanco will not worsen this.   Confusion Hopefully will improve with anbx, hydration.   Endocarditis (viridans strep) MV repair Will end his therapy at d/c due to concerns about ADR, swelling.    Thank you so much for this interesting consult,   Peter Bruce (pager) [phone] [email or web address]  Peter Bruce is an 66 y.o. male.  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 He was also felt to have multiple CNS emboli. He was treated with ceftriaxone and d/c home with plan to continue on this til 3-18.  He was seen in oncology office on 3-6 He called ID office on 3-9 with complaints of face, eye, leg and feet swelling. He was advised to f/u at PCP or urgent care of allergic reaction.   His wife called me on call last night and complained of the same. She stated that he had not been out of bed in several weeks. I advised them that he should go to urgent care or ED to be evaluated. His wife stated that she was unable to [facility] him and suggested giving no further doses of ceftriaxone, giving benadryl 45m orally and calling 911 to have EMS eval the patient.  He was brought to ED this AM. He complained of loose BM, anorexia, weakness.  In Ed he was found to have R base pneumonia, elevated LFTs (AST/ALT 237/358), Cr 2.3 (previously normal), lactate 2.09, and WBC 27.8. He was started on vanco/zosyn for HCAP.   He has been afebrile.    Past Medical History:  Diagnosis Date   . Acute CHF (congestive heart failure) ([facility] 11/01/2015  . Acute on chronic diastolic heart failure ([facility] 09/27/2015  . Acute pulmonary embolus ([facility] 2010  . Acute respiratory failure with hypoxia ([facility] 09/09/2015  . Arthritis   . Atrial fibrillation ([facility]    s/p inpatient DCCV 09/11/2015  . Atrial flutter ([facility]   . CHF (congestive heart failure) ([facility]   . Coronary artery disease involving native coronary artery 05/15/2015   multivessel  . Dysrhythmia    A-fib  . Essential hypertension   . Fibromyalgia   . History of kidney stones   . Hypercholesterolemia   . Hypertension   . Lupus anticoagulant positive 07/29/2008  . Mitral regurgitation 10/30/2015  . PMR (polymyalgia rheumatica) (HCC)   . Pneumonia   . 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 S/P Maze operation for atrial fibrillation 05/29/2015   Complete bilateral atrial lesion set via median sternotomy using bipolar radiofrequency and cryothermy ablation with clipping of LA appendage  . 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 APPENDECTOMY    . arm surgery  Right  wrist and elbow  . CARDIAC CATHETERIZATION N/A 05/15/2015   Procedure: Left Heart Cath and Coronary Angiography;  Surgeon: Peter M Martinique, MD;  Location: [place] CV LAB;  Service: Cardiovascular;  Laterality: N/A;  . CARDIAC CATHETERIZATION N/A 11/17/2015   Procedure: Right/Left Heart Cath and Coronary/Graft Angiography;  Surgeon: Adrian Prows, MD;  Location: [place] CV LAB;  Service: Cardiovascular;  Laterality: N/A;  . CARDIOVERSION N/A 09/11/2015   Procedure: CARDIOVERSION;  Surgeon: Lelon Perla, MD;  Location[facility] ENDOSCOPY;   Service: Cardiovascular;  Laterality: N/A;  . CHEST TUBE INSERTION  11/21/2015   Procedure: LEFT CHEST TUBE INSERTION;  Surgeon: Rexene Alberts, MD;  Location: [place];  Service: Open Heart Surgery;;  . CLIPPING OF ATRIAL APPENDAGE  05/29/2015   Procedure: CLIPPING OF LEFT ATRIAL APPENDAGE;  Surgeon: Rexene Alberts, MD;  Location: [place];  Service: Open Heart Surgery;;  45 AtriClip PRO 145  . CORONARY ARTERY BYPASS GRAFT N/A 05/29/2015   Procedure: CORONARY ARTERY BYPASS GRAFTING (CABG), ON PUMP, TIMES THREE, USING LEFT INTERNAL MAMMARY ARTERY, BILATERAL GREATER SAPHENOUS VEINS HARVESTED ENDOSCOPICALLY;  Surgeon: Rexene Alberts, MD;  Location: [place];  Service: Open Heart Surgery;  Laterality: N/A;  . FRACTURE SURGERY     right wrist  . HERNIA REPAIR    . MAZE N/A 05/29/2015   Procedure: MAZE;  Surgeon: Rexene Alberts, MD;  Location: [place];  Service: Open Heart Surgery;  Laterality: N/A;  Complete Bi-Atrial Lesion set with Ablation and Cryothermy  . MITRAL VALVE REPAIR Right 11/21/2015   Procedure: MINIMALLY INVASIVE REOPERATION FOR MITRAL VALVE REPAIR;  Surgeon: Rexene Alberts, MD;  Location: [place];  Service: Open Heart Surgery;  Laterality: Right;  . PERIPHERAL VASCULAR CATHETERIZATION Bilateral 11/17/2015   Procedure: Renal Angiography;  Surgeon: Adrian Prows, MD;  Location: [place] CV LAB;  Service: Cardiovascular;  Laterality: Bilateral;  . PERIPHERAL VASCULAR CATHETERIZATION N/A 11/17/2015   Procedure: Abdominal Aortogram;  Surgeon: Adrian Prows, MD;  Location: [place] CV LAB;  Service: Cardiovascular;  Laterality: N/A;  . SHOULDER SURGERY Left    clavicular fracture  . TEE WITHOUT CARDIOVERSION N/A 05/29/2015   Procedure: TRANSESOPHAGEAL ECHOCARDIOGRAM (TEE);  Surgeon: Rexene Alberts, MD;  Location: [place];  Service: Open Heart Surgery;  Laterality: N/A;  . TEE WITHOUT CARDIOVERSION N/A 10/30/2015   Procedure: TRANSESOPHAGEAL ECHOCARDIOGRAM (TEE);  Surgeon: Adrian Prows, MD;  Location: [place];  Service: Cardiovascular;  Laterality: N/A;  . TEE WITHOUT CARDIOVERSION N/A 11/21/2015   Procedure: TRANSESOPHAGEAL ECHOCARDIOGRAM (TEE);  Surgeon: Rexene Alberts, MD;  Location: [place];  Service: Open Heart Surgery;  Laterality: N/A;     Allergies  Allergen Reactions  . Amitriptyline Shortness Of Breath  . Serotonin Reuptake Inhibitors (Ssris) Nausea And Vomiting    Medications:  Scheduled: . multivitamin with minerals  1 tablet Oral Daily  . piperacillin-tazobactam (ZOSYN)  IV  3.375 g Intravenous Q8H  . saccharomyces boulardii  250 mg Oral BID  . sodium chloride flush  3 mL Intravenous Q12H  . [START ON 07/20/2016] vancomycin  1,000 mg Intravenous Q24H    Abtx:  Anti-infectives    Start     Dose/Rate Route Frequency Ordered Stop   07/20/16 1200  vancomycin (VANCOCIN) IVPB 1000 mg/200 mL premix     1,000 mg 200 mL/hr over 60 Minutes Intravenous Every 24 hours 07/19/16 1451     07/19/16 2000  piperacillin-tazobactam (ZOSYN) IVPB 3.375 g     3.375 g 12.5  mL/hr over 240 Minutes Intravenous Every 8 hours 07/19/16 1451     07/19/16 1200  piperacillin-tazobactam (ZOSYN) IVPB 3.375 g     3.375 g 12.5 mL/hr over 240 Minutes Intravenous  Once 07/19/16 1149 07/19/16 1301   07/19/16 1200  vancomycin (VANCOCIN) IVPB 1000 mg/200 mL premix     1,000 mg 200 mL/hr over 60 Minutes Intravenous  Once 07/19/16 1149 07/19/16 1408      Total days of antibiotics: 0 vanco/zosyn          Social History:  reports that he has never smoked. He has never used smokeless tobacco. He reports that he does not drink alcohol or use drugs.  Family History  Problem Relation Age of Onset  . Cancer Mother   . Other Sister     chf  . Other Sister     Guillain Barre    General ROS: denies cough or SOB. denies diarrhea. unable to tell if swelling better in hands. see HPI Please see HPI. 12 point ROS o/w (-)  Blood pressure 135/89, pulse 94, temperature 98.2 F (36.8 C), temperature source  Oral, resp. rate 20, height 5' 10"  (1.778 m), weight 81.6 kg (179 lb 14.3 oz), SpO2 95 %. General appearance: delirious, no distress and MSE- place ([place]), date (may), president (correctly answers) Eyes: negative findings: conjunctivae and sclerae normal and pupils equal, round, reactive to light and accomodation Throat: normal findings: oropharynx pink & moist without lesions or evidence of thrush Neck: supple, symmetrical, trachea midline Lungs: clear to auscultation bilaterally Heart: regular rate and rhythm Abdomen: normal findings: bowel sounds normal and soft, non-tender Extremities: edema trace BLE.  and RUE pic is clean, non-tender, no d/c.petechiae on feet.    Results for orders placed or performed during the hospital encounter of 07/19/16 (from the past 48 hour(s))  CBC with Differential     Status: Abnormal   Collection Time: 07/19/16 10:16 AM  Result Value Ref Range   WBC 27.8 (H) 4.0 - 10.5 K/uL   RBC 3.29 (L) 4.22 - 5.81 MIL/uL   Hemoglobin 10.3 (L) 13.0 - 17.0 g/dL   HCT 31.1 (L) 39.0 - 52.0 %   MCV 94.5 78.0 - 100.0 fL   MCH 31.3 26.0 - 34.0 pg   MCHC 33.1 30.0 - 36.0 g/dL   RDW 16.7 (H) 11.5 - 15.5 %   Platelets 72 (L) 150 - 400 K/uL    Comment: REPEATED TO VERIFY SPECIMEN CHECKED FOR CLOTS PLATELET COUNT CONFIRMED BY SMEAR    Neutrophils Relative % 88 %   Lymphocytes Relative 6 %   Monocytes Relative 6 %   Eosinophils Relative 0 %   Basophils Relative 0 %   Neutro Abs 24.4 (H) 1.7 - 7.7 K/uL   Lymphs Abs 1.7 0.7 - 4.0 K/uL   Monocytes Absolute 1.7 (H) 0.1 - 1.0 K/uL   Eosinophils Absolute 0.0 0.0 - 0.7 K/uL   Basophils Absolute 0.0 0.0 - 0.1 K/uL   RBC Morphology POLYCHROMASIA PRESENT   Comprehensive metabolic panel     Status: Abnormal   Collection Time: 07/19/16 10:16 AM  Result Value Ref Range   Sodium 134 (L) 135 - 145 mmol/L   Potassium 4.3 3.5 - 5.1 mmol/L   Chloride 101 101 - 111 mmol/L   CO2 25 22 - 32 mmol/L   Glucose, Bld 125 (H) 65 - 99  mg/dL   BUN 43 (H) 6 - 20 mg/dL   Creatinine, Ser 2.32 (H) 0.61 - 1.24  mg/dL   Calcium 8.6 (L) 8.9 - 10.3 mg/dL   Total Protein 6.1 (L) 6.5 - 8.1 g/dL   Albumin 3.3 (L) 3.5 - 5.0 g/dL   AST 237 (H) 15 - 41 U/L   ALT 358 (H) 17 - 63 U/L   Alkaline Phosphatase 81 38 - 126 U/L   Total Bilirubin 0.9 0.3 - 1.2 mg/dL   GFR calc non Af Amer 27 (L) >60 mL/min   GFR calc Af Amer 32 (L) >60 mL/min    Comment: (NOTE) The eGFR has been calculated using the CKD EPI equation. This calculation has not been validated in all clinical situations. eGFR's persistently <60 mL/min signify possible Chronic Kidney Disease.    Anion gap 8 5 - 15  I-Stat CG4 Lactic Acid, ED     Status: Abnormal   Collection Time: 07/19/16 10:26 AM  Result Value Ref Range   Lactic Acid, Venous 2.09 (HH) 0.5 - 1.9 mmol/L   Comment NOTIFIED PHYSICIAN   Urinalysis, Routine w reflex microscopic     Status: Abnormal   Collection Time: 07/19/16 11:56 AM  Result Value Ref Range   Color, Urine YELLOW YELLOW   APPearance CLOUDY (A) CLEAR   Specific Gravity, Urine 1.024 1.005 - 1.030   pH 5.0 5.0 - 8.0   Glucose, UA 50 (A) NEGATIVE mg/dL   Hgb urine dipstick MODERATE (A) NEGATIVE   Bilirubin Urine NEGATIVE NEGATIVE   Ketones, ur 5 (A) NEGATIVE mg/dL   Protein, ur 100 (A) NEGATIVE mg/dL   Nitrite NEGATIVE NEGATIVE   Leukocytes, UA NEGATIVE NEGATIVE   RBC / HPF 0-5 0 - 5 RBC/hpf   WBC, UA 0-5 0 - 5 WBC/hpf   Bacteria, UA RARE (A) NONE SEEN   Squamous Epithelial / LPF 0-5 (A) NONE SEEN   Mucous PRESENT    Sperm, UA PRESENT       Component Value Date/Time   SDES BLOOD BLOOD RIGHT HAND 06/14/2016 0242   SPECREQUEST BOTTLES DRAWN AEROBIC AND ANAEROBIC  5CC EA 06/14/2016 0242   CULT NO GROWTH 5 DAYS 06/14/2016 0242   REPTSTATUS 06/19/2016 FINAL 06/14/2016 0242   Dg Chest 2 View  Result Date: 07/19/2016 CLINICAL DATA:  Weakness and fatigue EXAM: CHEST  2 VIEW COMPARISON:  June 11, 2016 FINDINGS: There is airspace  consolidation in the right lower lobe with right pleural effusion. The left lung is clear. Heart is mildly enlarged with pulmonary venous hypertension. No adenopathy. Central catheter tip is in superior vena cava. Patient is status post coronary artery bypass grafting. There is a left atrial appendage clamp. There is degenerative change in the lower thoracic region. There is a small benign appearing cystic area in the right proximal humerus. IMPRESSION: Right base consolidation consistent with pneumonia with right pleural effusion. Left lung clear. There is pulmonary vascular congestion. No interstitial edema. Central catheter tip in superior vena cava. No evident pneumothorax. Electronically Signed   By: Lowella Grip III M.D.   On: 07/19/2016 10:23   No results found for this or any previous visit (from the past 240 hour(s)).    07/19/2016, 3:55 PM     LOS: 0 days    Records and images were personally reviewed where available.

## 2016-07-20 ENCOUNTER — Inpatient Hospital Stay (HOSPITAL_COMMUNITY): Payer: Medicare Other

## 2016-07-20 ENCOUNTER — Ambulatory Visit: Payer: Medicare Other | Admitting: Physical Therapy

## 2016-07-20 ENCOUNTER — Ambulatory Visit: Payer: Medicare Other

## 2016-07-20 DIAGNOSIS — N5089 Other specified disorders of the male genital organs: Secondary | ICD-10-CM

## 2016-07-20 LAB — BASIC METABOLIC PANEL
ANION GAP: 8 (ref 5–15)
BUN: 42 mg/dL — AB (ref 6–20)
CO2: 25 mmol/L (ref 22–32)
Calcium: 8.4 mg/dL — ABNORMAL LOW (ref 8.9–10.3)
Chloride: 106 mmol/L (ref 101–111)
Creatinine, Ser: 2.27 mg/dL — ABNORMAL HIGH (ref 0.61–1.24)
GFR calc Af Amer: 33 mL/min — ABNORMAL LOW (ref >60)
GFR, EST NON AFRICAN AMERICAN: 28 mL/min — AB (ref >60)
Glucose, Bld: 108 mg/dL — ABNORMAL HIGH (ref 65–99)
POTASSIUM: 4 mmol/L (ref 3.5–5.1)
Sodium: 139 mmol/L (ref 135–145)

## 2016-07-20 LAB — URINE CULTURE: CULTURE: NO GROWTH

## 2016-07-20 LAB — CBC
HEMATOCRIT: 29.5 % — AB (ref 39.0–52.0)
Hemoglobin: 9.8 g/dL — ABNORMAL LOW (ref 13.0–17.0)
MCH: 31.4 pg (ref 26.0–34.0)
MCHC: 33.2 g/dL (ref 30.0–36.0)
MCV: 94.6 fL (ref 78.0–100.0)
Platelets: 71 10*3/uL — ABNORMAL LOW (ref 150–400)
RBC: 3.12 MIL/uL — ABNORMAL LOW (ref 4.22–5.81)
RDW: 17.2 % — AB (ref 11.5–15.5)
WBC: 27.4 10*3/uL — ABNORMAL HIGH (ref 4.0–10.5)

## 2016-07-20 LAB — C-REACTIVE PROTEIN: CRP: 18.2 mg/dL — ABNORMAL HIGH (ref <1.0)

## 2016-07-20 LAB — PROCALCITONIN: PROCALCITONIN: 0.9 ng/mL

## 2016-07-20 LAB — HEPARIN LEVEL (UNFRACTIONATED): HEPARIN UNFRACTIONATED: 0.18 [IU]/mL — AB (ref 0.30–0.70)

## 2016-07-20 LAB — LACTATE DEHYDROGENASE: LDH: 527 U/L — ABNORMAL HIGH (ref 98–192)

## 2016-07-20 MED ORDER — HEPARIN (PORCINE) IN NACL 100-0.45 UNIT/ML-% IJ SOLN
2100.0000 [IU]/h | INTRAMUSCULAR | Status: DC
  Administered 2016-07-20: 1900 [IU]/h via INTRAVENOUS
  Administered 2016-07-21: 2100 [IU]/h via INTRAVENOUS
  Filled 2016-07-20: qty 250

## 2016-07-20 MED ORDER — OXYCODONE-ACETAMINOPHEN 5-325 MG PO TABS
1.0000 | ORAL_TABLET | Freq: Four times a day (QID) | ORAL | Status: DC | PRN
  Administered 2016-07-21: 1 via ORAL
  Administered 2016-07-21: 2 via ORAL
  Administered 2016-07-22 – 2016-07-23 (×3): 1 via ORAL
  Filled 2016-07-20 (×2): qty 2
  Filled 2016-07-20 (×4): qty 1

## 2016-07-20 NOTE — Evaluation (Signed)
Physical Therapy Evaluation Patient Details Name: Peter Bruce MRN: [id number] DOB: 1951-05-05 Today's Date: 07/20/2016   History of Present Illness  66 yo male admitted with Pna, confusion. hx of CVA, endocarditis, CAD, PE, A fib, DVt, lupus, CABG, CHF, arthritis, tongue cancer.   Clinical Impression   On eval, pt required Min-Mod assist for mobility. He was able to stand and take a few steps, with a RW, along the side of the bed. Mobility is currently limited by pain and weakness. Unable to safely attempt ambulation with +1 assist on today. Will continue to follow and progress activity as tolerated.     Follow Up Recommendations Home health PT;Supervision/Assistance - 24 hour (as long as family can provide current level of care. May need to consider SNF if mobility does not improve. )    Equipment Recommendations  None recommended by PT    Recommendations for Other Services       Precautions / Restrictions Precautions Precautions: Fall Restrictions Weight Bearing Restrictions: No      Mobility  Bed Mobility Overal bed mobility: Needs Assistance Bed Mobility: Sidelying to Sit;Sit to Sidelying   Sidelying to sit: Min assist;HOB elevated     Sit to sidelying: Min assist;HOB elevated General bed mobility comments: Assist for trunk and LEs. Increased time. Pt relied heavily on bedrail.   Transfers Overall transfer level: Needs assistance Equipment used: Rolling walker (2 wheeled) Transfers: Sit to/from Stand Sit to Stand: Mod assist;From elevated surface         General transfer comment: Assist to rise, stabilize, control descent. VCs safety, technique, hand placement.   Ambulation/Gait Ambulation/Gait assistance: Min assist   Assistive device: Rolling walker (2 wheeled)       General Gait Details: side steps along side of bed with RW. Increased time. Assist to stabilize pt and maneuver walker.   Stairs            Wheelchair Mobility    Modified Rankin  (Stroke Patients Only)       Balance Overall balance assessment: Needs assistance           Standing balance-Leahy Scale: Poor                               Pertinent Vitals/Pain Pain Assessment: Faces Faces Pain Scale: Hurts whole lot Pain Location: groin and abdomen Pain Descriptors / Indicators: Grimacing Pain Intervention(s): Limited activity within patient's tolerance;Repositioned    Home Living Family/patient expects to be discharged to:: Private residence Living Arrangements: Spouse/significant other Available Help at Discharge: Family Type of Home: House Home Access: Stairs to enter Entrance Stairs-Rails: Psychiatric nurse of Steps: 3 Home Layout: One level Home Equipment: Environmental consultant - 2 wheels;Cane - single point      Prior Function Level of Independence: Independent               Hand Dominance        Extremity/Trunk Assessment   Upper Extremity Assessment Upper Extremity Assessment: Generalized weakness    Lower Extremity Assessment Lower Extremity Assessment: Generalized weakness    Cervical / Trunk Assessment Cervical / Trunk Assessment: Normal  Communication   Communication: No difficulties  Cognition Arousal/Alertness: Awake/alert Behavior During Therapy: WFL for tasks assessed/performed Overall Cognitive Status: Within Functional Limits for tasks assessed                      General Comments  Exercises     Assessment/Plan    PT Assessment Patient needs continued PT services  PT Problem List Decreased strength;Decreased mobility;Decreased activity tolerance;Decreased balance;Decreased knowledge of use of DME;Pain       PT Treatment Interventions DME instruction;Gait training;Therapeutic activities;Therapeutic exercise;Patient/family education;Balance training;Functional mobility training    PT Goals (Current goals can be found in the Care Plan section)  Acute Rehab PT Goals Patient  Stated Goal: less pain PT Goal Formulation: With patient Time For Goal Achievement: 08/03/16 Potential to Achieve Goals: Good    Frequency Min 3X/week   Barriers to discharge        Co-evaluation               End of Session Equipment Utilized During Treatment: Gait belt Activity Tolerance: Patient limited by fatigue;Patient limited by pain Patient left: in bed;with call bell/phone within reach;with bed alarm set   PT Visit Diagnosis: Muscle weakness (generalized) (M62.81);Difficulty in walking, not elsewhere classified (R26.2)         Time: 0277-4128 PT Time Calculation (min) (ACUTE ONLY): 14 min   Charges:   PT Evaluation $PT Eval Low Complexity: 1 Procedure     PT G Codes:         Weston Anna, MPT Pager: [phone]

## 2016-07-20 NOTE — Consult Note (Signed)
   [facility] CM Inpatient Consult   07/20/2016  Peter Bruce April 12, 1951 [id number]    [name] screened for [place] Management program services due to 40% extreme unplanned readmission score. Went to room to discuss potential [facility] Care Management services. However, [name] appeared slightly confused and family was not at bedside. Provided contact information and [facility]  Care Management brochure to covering nurse to please give to family. Made inpatient RNCM aware of attempted visit. Will continue to follow and engage for potential [facility] Care Management services when appropriate.    Marthenia Rolling, MSN-Ed, RN,BSN [facility] Liaison [phone]

## 2016-07-20 NOTE — Progress Notes (Signed)
[facility]  [name] is an active pt with [place] teams prior to this readmission.   Pt was receiving Rocephin 2gms Q12hrs via IVP at home ordered thru 07/25/16.  [facility] hospital team will follow [name] while here to support transition home when ordered.  If patient discharges after hours, please call [phone].   Larry Sierras 07/20/2016, 10:45 AM

## 2016-07-20 NOTE — Consult Note (Signed)
Urology Consult  Referring physician:   Dr. Quincy Simmonds Reason for referral:    scrotal pain x 1 week   Impression/Assessment:  1. Testis pain: concerned with abnormal scrotal u/s, b/c of the possibility of MM; and would agree with Dr. Quincy Simmonds that Oncology consult would be appropriate. Difficult to imagine testis cancer in this way, but possible, and could be biopsied.  2. L renal stone: 2m, mid pole. No hydronephrosis, no obstruction, no flank pain, no gross or micro hematuria. No infection. Does not represent "clear and present" danger at this point, but may eventually cause problem with obstruction.  3. Renal cyst. Appears to be cyst on u/s. Could have MRI in future, but does not appear to be proximate cause of patient's condition now.   Plan:  1. Bilateral scrotal ultrasound today.  2. Discuss u/s with Dr. SQuincy Simmondsand possibly Oncology. ? Testis bx.          [place] (Accession [id number] (Order [id number]  Imaging  Date: 07/20/2016 [facility] Released By/Authorizing: SCarolan Clines MD (auto-released)  Exam Information   Status Exam Begun  Exam Ended   Final [99] 07/20/2016 3:28 PM 07/20/2016 4:19 PM  PACS Images   Show images for [place]  Study Result   CLINICAL DATA:  One week history of scrotal pain  EXAM: SCROTAL ULTRASOUND  DOPPLER ULTRASOUND OF THE TESTICLES  TECHNIQUE: Complete ultrasound examination of the testicles, epididymis, and other scrotal structures was performed. Color and spectral Doppler ultrasound were also utilized to evaluate blood flow to the testicles.  COMPARISON:  None.  FINDINGS: Right testicle  Measurements: 5.2 x 4.0 x 3.7 cm. The testicular parenchyma is diffusely inhomogeneous with areas of relative hyperechoic and hypoechoic parenchyma. No well-defined mass seen. No hyperemia seen on the right.  Left testicle  Measurements: 4.9 x 3.8 x 3.8 cm. Testicular parenchyma  is diffusely inhomogeneous with areas of relative hyperechoic and hypoechoic testicular parenchyma. Appearance is similar to the contralateral side. No well-defined mass or hyperemia seen on the left.  Right epididymis:  Normal in size and appearance.  Left epididymis:  Normal in size and appearance.  Hydrocele:  Moderate hydroceles bilaterally.  Varicocele: Small varicocele on the left. No varicocele on the right.  Pulsed Doppler interrogation of both testes demonstrates normal low resistance arterial and venous waveforms bilaterally.  No scrotal abscess or scrotal wall thickening.  IMPRESSION: Diffusely inhomogeneous testicular parenchyma bilaterally without well-defined mass on either side. No hyperemia. This appearance raises concern for infiltrative type lesions within each testis. 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 More infiltrative type lesions such is extraosseous multiple myeloma or lymphoma can involve both testes simultaneously and must be of concern. The lack of hyperemia makes bilateral orchitis seen less likely, although the inhomogeneous appearance of the testicular parenchyma could be due to infectious etiology.  Moderate hydroceles bilaterally.  No extratesticular mass.  No torsion on either side.  These results will be called to the ordering clinician or representative by the Radiologist Assistant, and communication documented in the PACS or zVision Dashboard.   Electronically Signed   By: WLowella GripIII M.D.   On: 07/20/2016 16:32     History of Present Illness:   66yo male, complaining of bilateral testis pain x 1 week. No testis trauma. No swelling or redness of the scrotum.  He has a complex medical hx, including,  1. BPH 2. Hypogonadism; primary ( last T= 439), PSA 2.9) October, 2016. 3. Sleep  apnea; 4. Polymyalgia rheumatica; 5. Clotting disorder, with subsequent PE; ( Lupus Anticoagulant Positive) 6.  Kidney stones; 7. CAD; 8. Atrial Fibrillation;  9. 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 Acute CHF (congestive heart failure) ([place]) 11/01/2015  . Acute on chronic diastolic heart failure ([place]) 09/27/2015  . Acute pulmonary embolus ([place]) 2010  . Acute respiratory failure with hypoxia ([place]) 09/09/2015  . Arthritis   . Atrial fibrillation [facility])    s/p inpatient DCCV 09/11/2015  . Atrial flutter ([place])   . CHF (congestive heart failure) ([place])   . Coronary artery disease involving native coronary artery 05/15/2015   multivessel  . Dysrhythmia    A-fib  . Essential hypertension   . Fibromyalgia   . History of kidney stones   . Hypercholesterolemia   . Hypertension   . Lupus anticoagulant positive 07/29/2008  . Mitral regurgitation 10/30/2015  . PMR (polymyalgia rheumatica) ([facility])   . Pneumonia   . 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 S/P Maze operation for atrial fibrillation 05/29/2015   Complete bilateral atrial lesion set via median sternotomy using bipolar radiofrequency and cryothermy ablation with clipping of LA appendage  . 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 APPENDECTOMY    . arm surgery  Right    wrist and elbow  . CARDIAC CATHETERIZATION N/A 05/15/2015   Procedure: Left Heart Cath and Coronary Angiography;  Surgeon: Peter M Martinique, MD;  Location: [place] CV LAB;  Service: Cardiovascular;  Laterality: N/A;  . CARDIAC CATHETERIZATION N/A 11/17/2015   Procedure: Right/Left Heart Cath and Coronary/Graft Angiography;  Surgeon: Adrian Prows, MD;  Location: [place] [facility] LAB;  Service: Cardiovascular;  Laterality:  N/A;  . CARDIOVERSION N/A 09/11/2015   Procedure: CARDIOVERSION;  Surgeon: Lelon Perla, MD;  Location: [facility]OS[facility];  Service: Cardiovascular;  Laterality: N/A;  . CHEST TUBE INSERTION  11/21/2015   Procedure: LEFT CHEST TUBE INSERTION;  Surgeon: Rexene Alberts, MD;  Location: [place];  Service: Open Heart Surgery;;  . CLIPPING OF ATRIAL APPENDAGE  05/29/2015   Procedure: CLIPPING OF LEFT ATRIAL APPENDAGE;  Surgeon: Rexene Alberts, MD;  Location: [place];  Service: Open Heart Surgery;;  45 AtriClip PRO 145  . CORONARY ARTERY BYPASS GRAFT N/A 05/29/2015   Procedure: CORONARY ARTERY BYPASS GRAFTING (CABG), ON PUMP, TIMES THREE, USING LEFT INTERNAL MAMMARY ARTERY, BILATERAL GREATER SAPHENOUS VEINS HARVESTED ENDOSCOPICALLY;  Surgeon: Rexene Alberts, MD;  Location: [place];  Service: Open Heart Surgery;  Laterality: N/A;  . FRACTURE SURGERY     right wrist  . HERNIA REPAIR    . MAZE N/A 05/29/2015   Procedure: MAZE;  Surgeon: Rexene Alberts, MD;  Location: [place];  Service: Open Heart Surgery;  Laterality: N/A;  Complete Bi-Atrial Lesion set with Ablation and Cryothermy  . MITRAL VALVE REPAIR Right 11/21/2015   Procedure: MINIMALLY INVASIVE REOPERATION FOR MITRAL VALVE REPAIR;  Surgeon: Rexene Alberts, MD;  Location: [place];  Service: Open Heart Surgery;  Laterality: Right;  . PERIPHERAL VASCULAR CATHETERIZATION Bilateral 11/17/2015   Procedure: Renal Angiography;  Surgeon: Adrian Prows,  MD;  Location: [place] CV LAB;  Service: Cardiovascular;  Laterality: Bilateral;  . PERIPHERAL VASCULAR CATHETERIZATION N/A 11/17/2015   Procedure: Abdominal Aortogram;  Surgeon: Adrian Prows, MD;  Location: [place] CV LAB;  Service: Cardiovascular;  Laterality: N/A;  . SHOULDER SURGERY Left    clavicular fracture  . TEE WITHOUT CARDIOVERSION N/A 05/29/2015   Procedure: TRANSESOPHAGEAL ECHOCARDIOGRAM (TEE);  Surgeon: Rexene Alberts, MD;  Location: [place];  Service: Open Heart Surgery;  Laterality: N/A;  . TEE WITHOUT  CARDIOVERSION N/A 10/30/2015   Procedure: TRANSESOPHAGEAL ECHOCARDIOGRAM (TEE);  Surgeon: Adrian Prows, MD;  Location: [place];  Service: Cardiovascular;  Laterality: N/A;  . TEE WITHOUT CARDIOVERSION N/A 11/21/2015   Procedure: TRANSESOPHAGEAL ECHOCARDIOGRAM (TEE);  Surgeon: Rexene Alberts, MD;  Location: [place];  Service: Open Heart Surgery;  Laterality: N/A;    Medications: Medications:  Scheduled: . multivitamin with minerals  1 tablet Oral Daily  . piperacillin-tazobactam (ZOSYN)  IV  3.375 g Intravenous Q8H  . saccharomyces boulardii  250 mg Oral BID  . sodium chloride flush  3 mL Intravenous Q12H  . [START ON 07/20/2016] vancomycin  1,000 mg Intravenous Q24H    Abtx:            Anti-infectives    Start     Dose/Rate Route Frequency Ordered Stop   07/20/16 1200  vancomycin (VANCOCIN) IVPB 1000 mg/200 mL premix     1,000 mg 200 mL/hr over 60 Minutes Intravenous Every 24 hours 07/19/16 1451     07/19/16 2000  piperacillin-tazobactam (ZOSYN) IVPB 3.375 g     3.375 g 12.5 mL/hr over 240 Minutes Intravenous Every 8 hours 07/19/16 1451     07/19/16 1200  piperacillin-tazobactam (ZOSYN) IVPB 3.375 g     3.375 g 12.5 mL/hr over 240 Minutes Intravenous  Once 07/19/16 1149 07/19/16 1301   07/19/16 1200  vancomycin (VANCOCIN) IVPB 1000 mg/200 mL premix     1,000 mg 200 mL/hr over 60 Minutes Intravenous  Once 07/19/16 1149 07/19/16 1408      Total days of antibiotics: 0 vanco/zosyn                                                                                        Allergies:  Allergies  Allergen Reactions  . Amitriptyline Shortness Of Breath  . Serotonin Reuptake Inhibitors (Ssris) Nausea And Vomiting    Family History  Problem Relation Age of Onset  . Cancer Mother   . Other Sister     chf  . Other Sister     Rosalee Kaufman    Social History:  reports that he has never smoked. He has never used smokeless tobacco. He reports that he does not drink  alcohol or use drugs.  ROS: severe fatigue, light-headedness, depression, restless sleep, muscle aching, loss of appetite, inability to focus.,   Physical Exam:  Vital signs in last 24 hours: Temp:  [98.2 F (36.8 C)-98.8 F (37.1 C)] 98.8 F (37.1 C) (03/13 0452) Pulse Rate:  [93-104] 101 (03/13 0452) Resp:  [18-24] 22 (03/13 0452) BP: (116-148)/(76-104) 137/97 (03/13 0452) SpO2:  [92 %-98 %] 94 % (03/13 0452)  Weight:  [81.6 kg (179 lb 14.3 oz)-83 kg (183 lb)] 82.4 kg (181 lb 10.5 oz) (03/13 0456)    Physical Exam: General exam: Appears calm and comfortable  HEENT: AC/AT, PERRLA, OP moist and clear Respiratory system: Clear to auscultation. No wheezes,crackle or rhonchi Cardiovascular system: S1 & S2 heard, RRR. No JVD, murmurs, rubs or gallops Gastrointestinal system: Abdomen is nondistended, soft and nontender. No organomegaly or masses felt. Normal bowel sounds heard. Central nervous system: Alert and oriented. No focal neurological deficits. Extremities: No pedal edema. Symmetric, strength 5/5   Skin: No rashes, lesions or ulcers Psychiatry: Slightly confused at first, but then responded appropriately.                      Judgement and insight normalized. Mood & affect appropriate.    Laboratory Data:  Results for orders placed or performed during the hospital encounter of 07/19/16 (from the past 72 hour(s))  CBC with Differential     Status: Abnormal   Collection Time: 07/19/16 10:16 AM  Result Value Ref Range   WBC 27.8 (H) 4.0 - 10.5 K/uL   RBC 3.29 (L) 4.22 - 5.81 MIL/uL   Hemoglobin 10.3 (L) 13.0 - 17.0 g/dL   HCT 31.1 (L) 39.0 - 52.0 %   MCV 94.5 78.0 - 100.0 fL   MCH 31.3 26.0 - 34.0 pg   MCHC 33.1 30.0 - 36.0 g/dL   RDW 16.7 (H) 11.5 - 15.5 %   Platelets 72 (L) 150 - 400 K/uL    Comment: REPEATED TO VERIFY SPECIMEN CHECKED FOR CLOTS PLATELET COUNT CONFIRMED BY SMEAR    Neutrophils Relative % 88 %   Lymphocytes Relative 6 %   Monocytes Relative 6 %    Eosinophils Relative 0 %   Basophils Relative 0 %   Neutro Abs 24.4 (H) 1.7 - 7.7 K/uL   Lymphs Abs 1.7 0.7 - 4.0 K/uL   Monocytes Absolute 1.7 (H) 0.1 - 1.0 K/uL   Eosinophils Absolute 0.0 0.0 - 0.7 K/uL   Basophils Absolute 0.0 0.0 - 0.1 K/uL   RBC Morphology POLYCHROMASIA PRESENT   Comprehensive metabolic panel     Status: Abnormal   Collection Time: 07/19/16 10:16 AM  Result Value Ref Range   Sodium 134 (L) 135 - 145 mmol/L   Potassium 4.3 3.5 - 5.1 mmol/L   Chloride 101 101 - 111 mmol/L   CO2 25 22 - 32 mmol/L   Glucose, Bld 125 (H) 65 - 99 mg/dL   BUN 43 (H) 6 - 20 mg/dL   Creatinine, Ser 2.32 (H) 0.61 - 1.24 mg/dL   Calcium 8.6 (L) 8.9 - 10.3 mg/dL   Total Protein 6.1 (L) 6.5 - 8.1 g/dL   Albumin 3.3 (L) 3.5 - 5.0 g/dL   AST 237 (H) 15 - 41 U/L   ALT 358 (H) 17 - 63 U/L   Alkaline Phosphatase 81 38 - 126 U/L   Total Bilirubin 0.9 0.3 - 1.2 mg/dL   GFR calc non Af Amer 27 (L) >60 mL/min   GFR calc Af Amer 32 (L) >60 mL/min    Comment: (NOTE) The eGFR has been calculated using the CKD EPI equation. This calculation has not been validated in all clinical situations. eGFR's persistently <60 mL/min signify possible Chronic Kidney Disease.    Anion gap 8 5 - 15  I-Stat CG4 Lactic Acid, ED     Status: Abnormal   Collection Time: 07/19/16 10:26 AM  Result Value  Ref Range   Lactic Acid, Venous 2.09 (HH) 0.5 - 1.9 mmol/L   Comment NOTIFIED PHYSICIAN   Urinalysis, Routine w reflex microscopic     Status: Abnormal   Collection Time: 07/19/16 11:56 AM  Result Value Ref Range   Color, Urine YELLOW YELLOW   APPearance CLOUDY (A) CLEAR   Specific Gravity, Urine 1.024 1.005 - 1.030   pH 5.0 5.0 - 8.0   Glucose, UA 50 (A) NEGATIVE mg/dL   Hgb urine dipstick MODERATE (A) NEGATIVE   Bilirubin Urine NEGATIVE NEGATIVE   Ketones, ur 5 (A) NEGATIVE mg/dL   Protein, ur 100 (A) NEGATIVE mg/dL   Nitrite NEGATIVE NEGATIVE   Leukocytes, UA NEGATIVE NEGATIVE   RBC / HPF 0-5 0 - 5  RBC/hpf   WBC, UA 0-5 0 - 5 WBC/hpf   Bacteria, UA RARE (A) NONE SEEN   Squamous Epithelial / LPF 0-5 (A) NONE SEEN   Mucous PRESENT    Sperm, UA PRESENT   Heparin level (unfractionated)     Status: Abnormal   Collection Time: 07/19/16  4:08 PM  Result Value Ref Range   Heparin Unfractionated <0.10 (L) 0.30 - 0.70 IU/mL    Comment:        IF HEPARIN RESULTS ARE BELOW EXPECTED VALUES, AND PATIENT DOSAGE HAS BEEN CONFIRMED, SUGGEST FOLLOW UP TESTING OF ANTITHROMBIN III LEVELS.   APTT     Status: None   Collection Time: 07/19/16  4:08 PM  Result Value Ref Range   aPTT 31 24 - 36 seconds  Protime-INR     Status: Abnormal   Collection Time: 07/19/16  4:08 PM  Result Value Ref Range   Prothrombin Time 18.7 (H) 11.4 - 15.2 seconds   INR 1.55   Heparin level (unfractionated)     Status: Abnormal   Collection Time: 07/19/16 10:56 PM  Result Value Ref Range   Heparin Unfractionated <0.10 (L) 0.30 - 0.70 IU/mL    Comment:        IF HEPARIN RESULTS ARE BELOW EXPECTED VALUES, AND PATIENT DOSAGE HAS BEEN CONFIRMED, SUGGEST FOLLOW UP TESTING OF ANTITHROMBIN III LEVELS.   Basic metabolic panel     Status: Abnormal   Collection Time: 07/20/16  3:53 AM  Result Value Ref Range   Sodium 139 135 - 145 mmol/L   Potassium 4.0 3.5 - 5.1 mmol/L   Chloride 106 101 - 111 mmol/L   CO2 25 22 - 32 mmol/L   Glucose, Bld 108 (H) 65 - 99 mg/dL   BUN 42 (H) 6 - 20 mg/dL   Creatinine, Ser 2.27 (H) 0.61 - 1.24 mg/dL   Calcium 8.4 (L) 8.9 - 10.3 mg/dL   GFR calc non Af Amer 28 (L) >60 mL/min   GFR calc Af Amer 33 (L) >60 mL/min    Comment: (NOTE) The eGFR has been calculated using the CKD EPI equation. This calculation has not been validated in all clinical situations. eGFR's persistently <60 mL/min signify possible Chronic Kidney Disease.    Anion gap 8 5 - 15  CBC     Status: Abnormal   Collection Time: 07/20/16  3:53 AM  Result Value Ref Range   WBC 27.4 (H) 4.0 - 10.5 K/uL   RBC 3.12 (L)  4.22 - 5.81 MIL/uL   Hemoglobin 9.8 (L) 13.0 - 17.0 g/dL   HCT 29.5 (L) 39.0 - 52.0 %   MCV 94.6 78.0 - 100.0 fL   MCH 31.4 26.0 - 34.0 pg   MCHC 33.2  30.0 - 36.0 g/dL   RDW 17.2 (H) 11.5 - 15.5 %   Platelets 71 (L) 150 - 400 K/uL    Comment: CONSISTENT WITH PREVIOUS RESULT  Procalcitonin - Baseline     Status: None   Collection Time: 07/20/16  3:53 AM  Result Value Ref Range   Procalcitonin 0.90 ng/mL    Comment:        Interpretation: PCT > 0.5 ng/mL and <= 2 ng/mL: Systemic infection (sepsis) is possible, but other conditions are known to elevate PCT as well. (NOTE)         ICU PCT Algorithm               Non ICU PCT Algorithm    ----------------------------     ------------------------------         PCT < 0.25 ng/mL                 PCT < 0.1 ng/mL     Stopping of antibiotics            Stopping of antibiotics       strongly encouraged.               strongly encouraged.    ----------------------------     ------------------------------       PCT level decrease by               PCT < 0.25 ng/mL       >= 80% from peak PCT       OR PCT 0.25 - 0.5 ng/mL          Stopping of antibiotics                                             encouraged.     Stopping of antibiotics           encouraged.    ----------------------------     ------------------------------       PCT level decrease by              PCT >= 0.25 ng/mL       < 80% from peak PCT        AND PCT >= 0.5 ng/mL             Continuing antibiotics                                              encouraged.       Continuing antibiotics            encouraged.    ----------------------------     ------------------------------     PCT level increase compared          PCT > 0.5 ng/mL         with peak PCT AND          PCT >= 0.5 ng/mL             Escalation of antibiotics                                          strongly encouraged.  Escalation of antibiotics        strongly encouraged.    No results found for this or  any previous visit (from the past 240 hour(s)). Creatinine:  Recent Labs  07/19/16 1016 07/20/16 0353  CREATININE 2.32* 2.27*   Baseline Creatinine:  1.29 ( 02/26/15); gfr 57    I  07/20/2016, 9:44 AM

## 2016-07-20 NOTE — Progress Notes (Signed)
Pharmacy - Brief Note (heparin level follow-up)  Pharmacy Consult for heparin drip Indication: atrial fibrillation  20:13 heparin level (= 0.18) subtherapeutic on 1650 units/hr - no issues with infusion per RN - no obvious bleeding  Plan:   Increase heparin to 1900 units/hr  Check 8h heparin level (check am labs)  Doreene Eland, PharmD, BCPS.   Pager: [phone] 07/20/2016 8:53 PM

## 2016-07-20 NOTE — Progress Notes (Signed)
INFECTIOUS DISEASE PROGRESS NOTE  ID: Peter Bruce is a 66 y.o. male with  Principal Problem:   HCAP (healthcare-associated pneumonia) Active Problems:   Paroxysmal atrial fibrillation (HCC)   Dyslipidemia   Respiratory failure with hypoxia (HCC)   Current use of long term anticoagulation   Chronic diastolic CHF (congestive heart failure) (HCC)   Essential hypertension   Cancer of base of tongue (HCC)   Streptococcal bacteremia   Prosthetic valve endocarditis (HCC)   Coronary artery disease involving coronary bypass graft of native heart without angina pectoris   Benign essential HTN   Leukocytosis   Anemia of chronic disease   AKI (acute kidney injury) ([place])   Dehydration  Subjective: Without complaints.  Denies cough.   Abtx:  Anti-infectives    Start     Dose/Rate Route Frequency Ordered Stop   07/20/16 1200  vancomycin (VANCOCIN) IVPB 1000 mg/200 mL premix     1,000 mg 200 mL/hr over 60 Minutes Intravenous Every 24 hours 07/19/16 1451     07/19/16 2000  piperacillin-tazobactam (ZOSYN) IVPB 3.375 g     3.375 g 12.5 mL/hr over 240 Minutes Intravenous Every 8 hours 07/19/16 1451     07/19/16 1200  piperacillin-tazobactam (ZOSYN) IVPB 3.375 g     3.375 g 12.5 mL/hr over 240 Minutes Intravenous  Once 07/19/16 1149 07/19/16 1301   07/19/16 1200  vancomycin (VANCOCIN) IVPB 1000 mg/200 mL premix     1,000 mg 200 mL/hr over 60 Minutes Intravenous  Once 07/19/16 1149 07/19/16 1408      Medications:  Scheduled: . multivitamin with minerals  1 tablet Oral Daily  . piperacillin-tazobactam (ZOSYN)  IV  3.375 g Intravenous Q8H  . saccharomyces boulardii  250 mg Oral BID  . sodium chloride flush  3 mL Intravenous Q12H  . vancomycin  1,000 mg Intravenous Q24H    Objective: Vital signs in last 24 hours: Temp:  [97.9 F (36.6 C)-98.8 F (37.1 C)] 97.9 F (36.6 C) (03/13 1348) Pulse Rate:  [92-104] 92 (03/13 1348) Resp:  [20-22] 21 (03/13 1348) BP: (96-148)/(64-97) 96/64  (03/13 1348) SpO2:  [92 %-97 %] 92 % (03/13 1348) Weight:  [82.4 kg (181 lb 10.5 oz)] 82.4 kg (181 lb 10.5 oz) (03/13 0456)   General appearance: alert, cooperative, no distress and MSE 3/3 (place, date, person) Resp: diminished breath sounds anterior - right Cardio: regular rate and rhythm GI: normal findings: bowel sounds normal and soft, non-tender Extremities: edema trace BLE  Lab Results  Recent Labs  07/19/16 1016 07/20/16 0353  WBC 27.8* 27.4*  HGB 10.3* 9.8*  HCT 31.1* 29.5*  NA 134* 139  K 4.3 4.0  CL 101 106  CO2 25 25  BUN 43* 42*  CREATININE 2.32* 2.27*   Liver Panel  Recent Labs  07/19/16 1016  PROT 6.1*  ALBUMIN 3.3*  AST 237*  ALT 358*  ALKPHOS 81  BILITOT 0.9   Sedimentation Rate No results for input(s): ESRSEDRATE in the last 72 hours. C-Reactive Protein No results for input(s): CRP in the last 72 hours.  Microbiology: Recent Results (from the past 240 hour(s))  Blood culture (routine x 2)     Status: None (Preliminary result)   Collection Time: 07/19/16 11:56 AM  Result Value Ref Range Status   Specimen Description BLOOD LEFT ANTECUBITAL  Final   Special Requests IN PEDIATRIC BOTTLE 3.5CC  Final   Culture   Final    NO GROWTH 1 DAY Performed at [facility],  [address], [place], [place] [address]    Report Status PENDING  Incomplete  Urine culture     Status: None   Collection Time: 07/19/16 11:56 AM  Result Value Ref Range Status   Specimen Description URINE, RANDOM  Final   Special Requests NONE  Final   Culture   Final    NO GROWTH Performed at [facility], [address], [place], [place] [address]    Report Status 07/20/2016 FINAL  Final  Blood culture (routine x 2)     Status: None (Preliminary result)   Collection Time: 07/19/16 12:03 PM  Result Value Ref Range Status   Specimen Description BLOOD LEFT HAND  Final   Special Requests IN PEDIATRIC BOTTLE 3CC  Final   Culture   Final    NO GROWTH 1  DAY Performed at [facility], [place] [address] [place], [place] [address]    Report Status PENDING  Incomplete    Studies/Results: Dg Chest 2 View  Result Date: 07/19/2016 CLINICAL DATA:  Weakness and fatigue EXAM: CHEST  2 VIEW COMPARISON:  June 11, 2016 FINDINGS: There is airspace consolidation in the right lower lobe with right pleural effusion. The left lung is clear. Heart is mildly enlarged with pulmonary venous hypertension. No adenopathy. Central catheter tip is in superior vena cava. Patient is status post coronary artery bypass grafting. There is a left atrial appendage clamp. There is degenerative change in the lower thoracic region. There is a small benign appearing cystic area in the right proximal humerus. IMPRESSION: Right base consolidation consistent with pneumonia with right pleural effusion. Left lung clear. There is pulmonary vascular congestion. No interstitial edema. Central catheter tip in superior vena cava. No evident pneumothorax. Electronically Signed   By: Lowella Grip III M.D.   On: 07/19/2016 10:23   US Scrotum  Result Date: 07/20/2016 CLINICAL DATA:  One week history of scrotal pain EXAM: SCROTAL ULTRASOUND DOPPLER ULTRASOUND OF THE TESTICLES TECHNIQUE: Complete ultrasound examination of the testicles, epididymis, and other scrotal structures was performed. Color and spectral Doppler ultrasound were also utilized to evaluate blood flow to the testicles. COMPARISON:  None. FINDINGS: Right testicle Measurements: 5.2 x 4.0 x 3.7 cm. The testicular parenchyma is diffusely inhomogeneous with areas of relative hyperechoic and hypoechoic parenchyma. No well-defined mass seen. No hyperemia seen on the right. Left testicle Measurements: 4.9 x 3.8 x 3.8 cm. Testicular parenchyma is diffusely inhomogeneous with areas of relative hyperechoic and hypoechoic testicular parenchyma. Appearance is similar to the contralateral side. No well-defined mass or hyperemia seen on  the left. Right epididymis:  Normal in size and appearance. Left epididymis:  Normal in size and appearance. Hydrocele:  Moderate hydroceles bilaterally. Varicocele: Small varicocele on the left. No varicocele on the right. Pulsed Doppler interrogation of both testes demonstrates normal low resistance arterial and venous waveforms bilaterally. No scrotal abscess or scrotal wall thickening. IMPRESSION: Diffusely inhomogeneous testicular parenchyma bilaterally without well-defined mass on either side. No hyperemia. This appearance raises concern for infiltrative type lesions within each testis. 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 More infiltrative type lesions such is extraosseous multiple myeloma or lymphoma can involve both testes simultaneously and must be of concern. The lack of hyperemia makes bilateral orchitis seen less likely, although the inhomogeneous appearance of the testicular parenchyma could be due to infectious etiology. Moderate hydroceles bilaterally. No extratesticular mass.  No torsion on either side. These results will be called to the ordering clinician or representative by  the Radiologist Assistant, and communication documented in the PACS or zVision Dashboard. Electronically Signed   By: Lowella Grip III M.D.   On: 07/20/2016 16:32   US Renal  Result Date: 07/20/2016 CLINICAL DATA:  Elevated creatinine and BUN, history of kidney stones, hypertension EXAM: RENAL / URINARY TRACT ULTRASOUND COMPLETE COMPARISON:  CT abdomen pelvis of 11/12/2015 FINDINGS: Right Kidney: Length: 11.4 cm. No hydronephrosis is seen. No renal calculi are evident. Left Kidney: Length: 11.2 cm. No hydronephrosis is seen. There is a nonobstructing calculus in the mid lower kidney of 7 mm in diameter. Also, there is a rounded structure which is hypoechogenic with some internal echoes. No blood flow is demonstrated and there is some through transmission. This probably represents a complex cyst, but  further assessment with CT or preferably MRI would be recommended. Bladder: The urinary bladder is unremarkable other than slight prominence of the prostate indenting the posterior urinary bladder. IMPRESSION: 1. Nonobstructing left renal calculus of 7 mm in diameter. 2. Probable complex cyst in the mid left kidney but further assessment with CT or preferably MRI would be recommended. 3. Prominent prostate. Electronically Signed   By: Ivar Drape M.D.   On: 07/20/2016 16:38   [place] Art/ven Flow Abd Pelv Doppler  Result Date: 07/20/2016 CLINICAL DATA:  One week history of scrotal pain EXAM: SCROTAL ULTRASOUND DOPPLER ULTRASOUND OF THE TESTICLES TECHNIQUE: Complete ultrasound examination of the testicles, epididymis, and other scrotal structures was performed. Color and spectral Doppler ultrasound were also utilized to evaluate blood flow to the testicles. COMPARISON:  None. FINDINGS: Right testicle Measurements: 5.2 x 4.0 x 3.7 cm. The testicular parenchyma is diffusely inhomogeneous with areas of relative hyperechoic and hypoechoic parenchyma. No well-defined mass seen. No hyperemia seen on the right. Left testicle Measurements: 4.9 x 3.8 x 3.8 cm. Testicular parenchyma is diffusely inhomogeneous with areas of relative hyperechoic and hypoechoic testicular parenchyma. Appearance is similar to the contralateral side. No well-defined mass or hyperemia seen on the left. Right epididymis:  Normal in size and appearance. Left epididymis:  Normal in size and appearance. Hydrocele:  Moderate hydroceles bilaterally. Varicocele: Small varicocele on the left. No varicocele on the right. Pulsed Doppler interrogation of both testes demonstrates normal low resistance arterial and venous waveforms bilaterally. No scrotal abscess or scrotal wall thickening. IMPRESSION: Diffusely inhomogeneous testicular parenchyma bilaterally without well-defined mass on either side. No hyperemia. This appearance raises concern for infiltrative  type lesions within each testis. 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 More infiltrative type lesions such is extraosseous multiple myeloma or lymphoma can involve both testes simultaneously and must be of concern. The lack of hyperemia makes bilateral orchitis seen less likely, although the inhomogeneous appearance of the testicular parenchyma could be due to infectious etiology. Moderate hydroceles bilaterally. No extratesticular mass.  No torsion on either side. These results will be called to the ordering clinician or representative by the Radiologist Assistant, and communication documented in the PACS or zVision Dashboard. Electronically Signed   By: Lowella Grip III M.D.   On: 07/20/2016 16:32     Assessment/Plan: Pneumonia, HCAP WBC stable, still elevated afebrile Will stop vanco due to Cr  Endocarditis Day 37 of therapy Would not continue his anbx after d/c.   ARF Will stop vanco Cr up slightly, hopefully at peak.   Total days of antibiotics: 37 1 this adm (vanco/zosyn)         Bobby Rumpf Infectious Diseases (pager) [phone] [email or web address] 07/20/2016, 5:26 PM  LOS: 1  day

## 2016-07-20 NOTE — Progress Notes (Addendum)
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 Patient presented with nausea, vomiting, diarrhea and general weakness. Found to have pneumonia,  Admitted for IV abx and further evaluation.   Subjective: Seen and examined, patient c/o testicular pain and swelling. Denies cough, chest pain, SOB and palpitations. Has not have any BM since admission.   Assessment & Plan:  Acute respiratory failure with hypoxia 2/2 HCAP, RLL: Follow repeat blood cultures NGTD. Continue empirically Zosyn. Vanco d/ced due to increase in Cr. Pro-calcitonin elevated - will trend. Off O2 supplementation. Albuterol PRN  Acute kidney injury: Recent creatinine on discharge 06/17/17 was 0.99. Presented with creatinine of 2.32. Secondary to dehydration, ARB and recent diuretics. Continue gentle hydration. Check BMP in AM  Diarrhea, nausea and vomiting so far self limiting: Concern for infectious etiology i.e. C. difficile given long-term IV antibiotics and marked leukocytosis. Contact isolation. Check C. difficile PCR. continue probiotics.  Testicular edema - Urology consulted - recommended Testicular US  Addendum 6:45 PM ? Infectious vs malignancy (MM) - elevate scrotum, patient on zosyn should cover GU infection. Discussed with Urology recommending Heme consult  please call oncology in AM - will send lab workup for MM - SPEP, LDH, CRP  Streptococcal bacteremia/sepsis/prosthetic valve endocarditis/embolic CVA: Was on home IV ceftriaxone with end date of 07/25/16. This is currently held and is getting IV Zosyn for pneumonia instead. ID recommendation greatly appreciated. Advised to stop therapy upon discharge. Follow up repeat blood cultures NGTD,   Paroxysmal A. fib: Sinus rhythm, not taking Xarelto. Was placed on IV heparin on admission, will continue for now given renal failure, unable to start NOAC   HTN - BP ok Holding ARB's Continue BB Monitor BP closely  Cancer base of right tongue base:  Outpatient treatment pending.  Anemia of chronic disease:  Hgb stable  Follow CBC  Check anemia panel   DVT prophylaxis: Lovenox Code Status: FULL Family Communication: None at bedside Disposition Plan: SNF vs Home with HHPT when medically stable   Consultants:   Urology - Dr. Gaynelle Arabian  ID - Dr Johnnye Sima     Procedures:   None  Antimicrobials:  Zosyn 07/19/16 ->  Vanco 07/19/16-07/20/16   Objective: Vitals:   07/19/16 2150 07/20/16 0452 07/20/16 0456 07/20/16 1348  BP: (!) 148/94 (!) 137/97  96/64  Pulse: (!) 104 (!) 101  92  Resp: 20 (!) 22  (!) 21  Temp: 98.5 F (36.9 C) 98.8 F (37.1 C)  97.9 F (36.6 C)  TempSrc: Oral Oral  Axillary  SpO2: 97% 94%  92%  Weight:   82.4 kg (181 lb 10.5 oz)   Height:        Intake/Output Summary (Last 24 hours) at 07/20/16 1510 Last data filed at 07/20/16 1000  Gross per 24 hour  Intake          2802.63 ml  Output  525 ml  Net          2277.63 ml   Filed Weights   07/19/16 0946 07/19/16 1355 07/20/16 0456  Weight: 83 kg (183 lb) 81.6 kg (179 lb 14.3 oz) 82.4 kg (181 lb 10.5 oz)    Examination:  General exam: NAD HEENT: OP clear Respiratory system: Breath sounds diminished at the R base, Rales at the r base Cardiovascular system: S1S2, RRR 2/6 SM no rubs or gallops    Gastrointestinal system: Abdomen soft, NTND Genitourinary: Swollen scrotal sac, very tender to palpation, no masses identified. Central nervous system: Alert and oriented. Extremities: B/l trace LE edema. Symmetric Skin: No rashes  Data Reviewed: I have personally reviewed following labs and imaging studies  CBC:  Recent Labs Lab 07/19/16 1016 07/20/16 0353  WBC 27.8* 27.4*  NEUTROABS 24.4*  --   HGB 10.3* 9.8*  HCT 31.1* 29.5*  MCV 94.5 94.6  PLT 72* 71*   Basic Metabolic Panel:  Recent Labs Lab 07/19/16 1016 07/20/16 0353  NA 134* 139  K 4.3 4.0  CL 101 106  CO2 25 25  GLUCOSE 125* 108*  BUN 43* 42*  CREATININE 2.32* 2.27*  CALCIUM 8.6* 8.4*   GFR: Estimated Creatinine Clearance: 32.6 mL/min (by C-G formula based on SCr of 2.27 mg/dL (H)). Liver Function Tests:  Recent Labs Lab 07/19/16 1016  AST 237*  ALT 358*  ALKPHOS 81  BILITOT 0.9  PROT 6.1*  ALBUMIN 3.3*   No results for input(s): LIPASE, AMYLASE in the last 168 hours. No results for input(s): AMMONIA in the last 168 hours. Coagulation Profile:  Recent Labs Lab 07/19/16 1608  INR 1.55   Cardiac Enzymes: No results for input(s): CKTOTAL, CKMB, CKMBINDEX, TROPONINI in the last 168 hours. BNP (last 3 results) No results for input(s): PROBNP in the last 8760 hours. HbA1C: No results for input(s): HGBA1C in the last 72 hours. CBG: No results for input(s): GLUCAP in the last 168 hours. Lipid Profile: No results for input(s): CHOL, HDL, LDLCALC, TRIG, CHOLHDL, LDLDIRECT in the last 72 hours. Thyroid Function Tests: No results for input(s): TSH, T4TOTAL, FREET4, T3FREE, THYROIDAB in the last 72 hours. Anemia Panel: No results for input(s): VITAMINB12, FOLATE, FERRITIN, TIBC, IRON, RETICCTPCT in the last 72 hours. Sepsis Labs:  Recent Labs Lab 07/19/16 1026 07/20/16 0353  PROCALCITON  --  0.90  LATICACIDVEN 2.09*  --     Recent Results (from the past 240 hour(s))  Blood culture  (routine x 2)     Status: None (Preliminary result)   Collection Time: 07/19/16 11:56 AM  Result Value Ref Range Status   Specimen Description BLOOD LEFT ANTECUBITAL  Final   Special Requests IN PEDIATRIC BOTTLE 3.5CC  Final   Culture   Final    NO GROWTH 1 DAY Performed at [facility], [place] [address], [place], [place] [address]    Report Status PENDING  Incomplete  Urine culture     Status: None   Collection Time: 07/19/16 11:56 AM  Result Value Ref Range Status   Specimen Description URINE, RANDOM  Final   Special Requests NONE  Final   Culture   Final    NO GROWTH Performed at [facility], [address] [place], [place] [address]    Report Status 07/20/2016 FINAL  Final  Blood culture (routine x 2)     Status: None (Preliminary result)   Collection Time: 07/19/16 12:03 PM  Result Value Ref Range Status  Specimen Description BLOOD LEFT HAND  Final   Special Requests IN PEDIATRIC BOTTLE 3CC  Final   Culture   Final    NO GROWTH 1 DAY Performed at [facility], [place] [address] [place], [place] [address]    Report Status PENDING  Incomplete      Radiology Studies: Dg Chest 2 View  Result Date: 07/19/2016 CLINICAL DATA:  Weakness and fatigue EXAM: CHEST  2 VIEW COMPARISON:  June 11, 2016 FINDINGS: There is airspace consolidation in the right lower lobe with right pleural effusion. The left lung is clear. Heart is mildly enlarged with pulmonary venous hypertension. No adenopathy. Central catheter tip is in superior vena cava. Patient is status post coronary artery bypass grafting. There is a left atrial appendage clamp. There is degenerative change in the lower thoracic region. There is a small benign appearing cystic area in the right proximal humerus. IMPRESSION: Right base consolidation consistent with pneumonia with right pleural effusion. Left lung clear. There is pulmonary vascular congestion. No interstitial edema. Central catheter tip in superior  vena cava. No evident pneumothorax. Electronically Signed   By: Lowella Grip III M.D.   On: 07/19/2016 10:23     Scheduled Meds: . multivitamin with minerals  1 tablet Oral Daily  . piperacillin-tazobactam (ZOSYN)  IV  3.375 g Intravenous Q8H  . saccharomyces boulardii  250 mg Oral BID  . sodium chloride flush  3 mL Intravenous Q12H  . vancomycin  1,000 mg Intravenous Q24H   Continuous Infusions: . heparin 1,650 Units/hr (07/20/16 1320)     LOS: 1 day    Chipper Oman, MD Pager: Text Page via [email or web address]  [phone]  If 7PM-7AM, please contact night-coverage [email or web address] Password [email or web address] 07/20/2016, 3:10 PM

## 2016-07-20 NOTE — Progress Notes (Signed)
ANTICOAGULATION CONSULT NOTE -   Pharmacy Consult for heparin drip Indication: atrial fibrillation  Allergies  Allergen Reactions  . Amitriptyline Shortness Of Breath  . Serotonin Reuptake Inhibitors (Ssris) Nausea And Vomiting    Patient Measurements: Height: 5\' 10"  (177.8 cm) Weight: 181 lb 10.5 oz (82.4 kg) IBW/kg (Calculated) : 73 Heparin Dosing Weight: 81.6  Vital Signs: Temp: 98.8 F (37.1 C) (03/13 0452) Temp Source: Oral (03/13 0452) BP: 137/97 (03/13 0452) Pulse Rate: 101 (03/13 0452)  Labs:  Recent Labs  07/19/16 1016 07/19/16 1608 07/19/16 2256 07/20/16 0353 07/20/16 1000  HGB 10.3*  --   --  9.8*  --   HCT 31.1*  --   --  29.5*  --   PLT 72*  --   --  71*  --   APTT  --  31  --   --   --   LABPROT  --  18.7*  --   --   --   INR  --  1.55  --   --   --   HEPARINUNFRC  --  <0.10* <0.10*  --  <0.10*  CREATININE 2.32*  --   --  2.27*  --     Estimated Creatinine Clearance: 32.6 mL/min (by C-G formula based on SCr of 2.27 mg/dL (H)).   Medical History: Past Medical History:  Diagnosis Date  . Acute CHF (congestive heart failure) ([place]) 11/01/2015  . Acute on chronic diastolic heart failure ([place]) 09/27/2015  . Acute pulmonary embolus ([place]) 2010  . Acute respiratory failure with hypoxia ([place]) 09/09/2015  . Arthritis   . Atrial fibrillation [facility])    s/p inpatient DCCV 09/11/2015  . Atrial flutter ([place])   . CHF (congestive heart failure) ([place])   . Coronary artery disease involving native coronary artery 05/15/2015   multivessel  . Dysrhythmia    A-fib  . Essential hypertension   . Fibromyalgia   . History of kidney stones   . Hypercholesterolemia   . Hypertension   . Lupus anticoagulant positive 07/29/2008  . Mitral regurgitation 10/30/2015  . PMR (polymyalgia rheumatica) (H[facility])   . Pneumonia   . 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 S/P Maze operation for atrial fibrillation  05/29/2015   Complete bilateral atrial lesion set via median sternotomy using bipolar radiofrequency and cryothermy ablation with clipping of LA appendage  . 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 Squamous cell cancer of buccal mucosa [facility])      Assessment: 66 year old male who presents with generalized weakness and fatigue. 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 History of prosthetic mitral valve and CABG. 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 Is on Xarelto.  Last dose xarelto 3/9 @ 1800, pharmacy consulted to dose heparin for afib  07/20/2016 Plts 71 Scr 2.27 Baseline aPTT 31 Heparin level < 0.1 on 1350 units/hr No bleeding or other issues per RN  Goal of Therapy:  Heparin level 0.3-0.7 units/ml  Monitor platelets by anticoagulation protocol   Plan:  Increase heparin drip to 1650 units/hr Check heparin  level in 8 hours Daily  heparin level/CBC   Dolly Rias RPh 07/20/2016, 11:03 AM Pager [phone]

## 2016-07-20 NOTE — Evaluation (Signed)
Occupational Therapy Evaluation Patient Details Name: Peter Bruce MRN: [id number] DOB: 06-04-1950 Today's Date: 07/20/2016    History of Present Illness 66 yo male admitted with Pna, confusion. hx of CVA, endocarditis, CAD, PE, A fib, DVt, lupus, CABG, CHF, arthritis, tongue cancer.    Clinical Impression   Pt admitted with PNA. Pt currently with functional limitations due to the deficits listed below (see OT Problem List).  Pt will benefit from skilled OT to increase their safety and independence with ADL and functional mobility for ADL to facilitate discharge to venue listed below.      Follow Up Recommendations  Home health OT;Supervision/Assistance - 24 hour          Precautions / Restrictions Precautions Precautions: Fall Restrictions Weight Bearing Restrictions: No      Mobility Bed Mobility Overal bed mobility: Needs Assistance Bed Mobility: Sidelying to Sit   Sidelying to sit: Mod assist     Sit to sidelying: Mod assist General bed mobility comments: increased time  Transfers Overall transfer level: Needs assistance Equipment used: Rolling walker (2 wheeled) Transfers: Sit to/from Stand Sit to Stand: Mod assist;From elevated surface         General transfer comment: did not perform    Balance Overall balance assessment: Needs assistance           Standing balance-Leahy Scale: Poor                              ADL Overall ADL's : Needs assistance/impaired Eating/Feeding: Bed level;Minimal assistance;Cueing for sequencing;Moderate assistance Eating/Feeding Details (indicate cue type and reason): min- mod A                     Toilet Transfer: Moderate assistance;Cueing for safety   Toileting- Clothing Manipulation and Hygiene: Moderate assistance;Sitting/lateral lean         General ADL Comments: Wife present and shared with OT pt was very I at home.  Pt currently needing A but she states she will be able to A.                    Pertinent Vitals/Pain Pain Assessment: Faces Faces Pain Scale: Hurts even more Pain Location: groin and abdomen Pain Descriptors / Indicators: Grimacing;Sore Pain Intervention(s): Monitored during session;Repositioned        Extremity/Trunk Assessment Upper Extremity Assessment Upper Extremity Assessment: Generalized weakness   Lower Extremity Assessment Lower Extremity Assessment: Generalized weakness   Cervical / Trunk Assessment Cervical / Trunk Assessment: Normal   Communication Communication Communication: No difficulties   Cognition Arousal/Alertness: Awake/alert Behavior During Therapy: WFL for tasks assessed/performed Overall Cognitive Status: Within Functional Limits for tasks assessed                     General Comments   pt needed increased A with self feeding and increased time            Home Living Family/patient expects to be discharged to:: Private residence Living Arrangements: Spouse/significant other Available Help at Discharge: Family Type of Home: House Home Access: Stairs to enter Technical brewer of Steps: 3 Entrance Stairs-Rails: Right;Left Home Layout: One level     Bathroom Shower/Tub: Occupational psychologist: Standard     Home Equipment: Environmental consultant - 2 wheels;Cane - single point          Prior Functioning/Environment Level of Independence: Independent  OT Problem List: Decreased strength;Decreased activity tolerance;Decreased knowledge of use of DME or AE;Pain      OT Treatment/Interventions: Self-care/ADL training;DME and/or AE instruction;Patient/family education    OT Goals(Current goals can be found in the care plan section) Acute Rehab OT Goals Patient Stated Goal: less pain OT Goal Formulation: With patient Time For Goal Achievement: 08/03/16 Potential to Achieve Goals: Good  OT Frequency: Min 2X/week              End of Session Nurse Communication: Mobility  status  Activity Tolerance: Patient limited by fatigue Patient left: with call bell/phone within reach;in bed;with family/visitor present  OT Visit Diagnosis: Unsteadiness on feet (R26.81);Muscle weakness (generalized) (M62.81)                ADL either performed or assessed with clinical judgement  Time: 9030-0923 OT Time Calculation (min): 26 min Charges:  OT General Charges $OT Visit: 1 Procedure OT Evaluation $OT Eval Moderate Complexity: 1 Procedure OT Treatments $Self Care/Home Management : 8-22 mins G-Codes:     [name], [place]  Payton Mccallum D 07/20/2016, 2:44 PM

## 2016-07-20 NOTE — Progress Notes (Signed)
ANTICOAGULATION CONSULT NOTE - Follow Up Consult  Pharmacy Consult for Heparin Indication: atrial fibrillation  Allergies  Allergen Reactions  . Amitriptyline Shortness Of Breath  . Serotonin Reuptake Inhibitors (Ssris) Nausea And Vomiting    Patient Measurements: Height: 5\' 10"  (177.8 cm) Weight: 179 lb 14.3 oz (81.6 kg) IBW/kg (Calculated) : 73 Heparin Dosing Weight:   Vital Signs: Temp: 98.5 F (36.9 C) (03/12 2150) Temp Source: Oral (03/12 2150) BP: 148/94 (03/12 2150) Pulse Rate: 104 (03/12 2150)  Labs:  Recent Labs  07/19/16 1016 07/19/16 1608 07/19/16 2256  HGB 10.3*  --   --   HCT 31.1*  --   --   PLT 72*  --   --   APTT  --  31  --   LABPROT  --  18.7*  --   INR  --  1.55  --   HEPARINUNFRC  --  <0.10* <0.10*  CREATININE 2.32*  --   --     Estimated Creatinine Clearance: 31.9 mL/min (by C-G formula based on SCr of 2.32 mg/dL (H)).   Medications:  Infusions:  . sodium chloride 125 mL/hr at 07/20/16 0034  . heparin 1,350 Units/hr (07/20/16 0025)    Assessment: Patient with low heparin level.  No heparin issues per RN.  Goal of Therapy:  Heparin level 0.3-0.7 units/ml Monitor platelets by anticoagulation protocol: Yes   Plan:  Increase heparin to 1350 units/hr Recheck heparin level at 0900  Tyler Deis, Shea Stakes [place] 07/20/2016,12:35 AM

## 2016-07-21 DIAGNOSIS — I10 Essential (primary) hypertension: Secondary | ICD-10-CM

## 2016-07-21 DIAGNOSIS — C01 Malignant neoplasm of base of tongue: Secondary | ICD-10-CM

## 2016-07-21 DIAGNOSIS — R7881 Bacteremia: Secondary | ICD-10-CM

## 2016-07-21 DIAGNOSIS — N179 Acute kidney failure, unspecified: Secondary | ICD-10-CM

## 2016-07-21 DIAGNOSIS — Z221 Carrier of other intestinal infectious diseases: Secondary | ICD-10-CM

## 2016-07-21 DIAGNOSIS — J189 Pneumonia, unspecified organism: Secondary | ICD-10-CM

## 2016-07-21 DIAGNOSIS — J181 Lobar pneumonia, unspecified organism: Secondary | ICD-10-CM

## 2016-07-21 LAB — CBC
HEMATOCRIT: 31.1 % — AB (ref 39.0–52.0)
Hemoglobin: 10 g/dL — ABNORMAL LOW (ref 13.0–17.0)
MCH: 30.1 pg (ref 26.0–34.0)
MCHC: 32.2 g/dL (ref 30.0–36.0)
MCV: 93.7 fL (ref 78.0–100.0)
Platelets: 74 10*3/uL — ABNORMAL LOW (ref 150–400)
RBC: 3.32 MIL/uL — AB (ref 4.22–5.81)
RDW: 17.6 % — ABNORMAL HIGH (ref 11.5–15.5)
WBC: 26.4 10*3/uL — AB (ref 4.0–10.5)

## 2016-07-21 LAB — C DIFFICILE QUICK SCREEN W PCR REFLEX
C DIFFICILE (CDIFF) TOXIN: NEGATIVE
C Diff antigen: POSITIVE — AB

## 2016-07-21 LAB — HEPARIN LEVEL (UNFRACTIONATED): HEPARIN UNFRACTIONATED: 0.17 [IU]/mL — AB (ref 0.30–0.70)

## 2016-07-21 LAB — COMPREHENSIVE METABOLIC PANEL
ALT: 232 U/L — AB (ref 17–63)
AST: 93 U/L — AB (ref 15–41)
Albumin: 2.8 g/dL — ABNORMAL LOW (ref 3.5–5.0)
Alkaline Phosphatase: 67 U/L (ref 38–126)
Anion gap: 9 (ref 5–15)
BILIRUBIN TOTAL: 1.3 mg/dL — AB (ref 0.3–1.2)
BUN: 43 mg/dL — AB (ref 6–20)
CHLORIDE: 102 mmol/L (ref 101–111)
CO2: 23 mmol/L (ref 22–32)
CREATININE: 2.31 mg/dL — AB (ref 0.61–1.24)
Calcium: 8.2 mg/dL — ABNORMAL LOW (ref 8.9–10.3)
GFR calc Af Amer: 32 mL/min — ABNORMAL LOW (ref >60)
GFR, EST NON AFRICAN AMERICAN: 28 mL/min — AB (ref >60)
GLUCOSE: 104 mg/dL — AB (ref 65–99)
Potassium: 3.7 mmol/L (ref 3.5–5.1)
Sodium: 134 mmol/L — ABNORMAL LOW (ref 135–145)
TOTAL PROTEIN: 5.8 g/dL — AB (ref 6.5–8.1)

## 2016-07-21 LAB — CLOSTRIDIUM DIFFICILE BY PCR: Toxigenic C. Difficile by PCR: POSITIVE — AB

## 2016-07-21 MED ORDER — RIVAROXABAN 15 MG PO TABS
15.0000 mg | ORAL_TABLET | Freq: Every day | ORAL | Status: DC
  Administered 2016-07-21 – 2016-07-23 (×3): 15 mg via ORAL
  Filled 2016-07-21 (×3): qty 1
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
## 2016-07-21 NOTE — Consult Note (Signed)
CARDIOLOGY CONSULT NOTE  Patient ID: Peter Bruce MRN: [id number] DOB/AGE: 07/27/1950 66 y.o.  Admit date: 07/19/2016 Referring Physician  Vernell Leep, MD Primary Physician:  Adrian Prows, MD Reason for Consultation  Endocarditis.  HPI: Peter Bruce  is a 66 y.o. male  With known coronary artery disease and did undergone CABG 3 on 05/29/2015. 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  He was admitted on 06/11/2016 and discharged on 06/17/2016 with mitral valve and apparatus, septic emboli to his brain, on IV antibiotic therapy.  He had streptococcal endocarditis.  He had been doing well until 3 days ago developed anasarca, shortness of breath, fatigue, appetite.  He is not admitted with pneumonia and I was consulted to see whether any further cardiac workup needs to be performed.  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 Prior to admission, agent also had diarrhea, loss of appetite, not feeling well.his past medical history significant for Paroxysmal atrial fibrillation, history of DVT and PE and has been on long-term anticoagulation.  Past Medical History:  Diagnosis Date  . Acute CHF (congestive heart failure) ([place]) 11/01/2015  . Acute on chronic diastolic heart failure ([place]) 09/27/2015  . Acute pulmonary embolus ([place]) 2010  . Acute respiratory failure with hypoxia ([place]) 09/09/2015  . Arthritis   . Atrial fibrillation [facility])    s/p inpatient DCCV 09/11/2015  . Atrial flutter ([place])   . CHF (congestive heart failure) ([place])   . Coronary artery disease involving native coronary artery 05/15/2015   multivessel  . Dysrhythmia    A-fib  . Essential hypertension   . Fibromyalgia   . History of kidney stones   . Hypercholesterolemia   . Hypertension   . Lupus anticoagulant positive 07/29/2008  . Mitral regurgitation  10/30/2015  . PMR (polymyalgia rheumatica) (HCC)   . Pneumonia   . 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 S/P Maze operation for atrial fibrillation 05/29/2015   Complete bilateral atrial lesion set via median sternotomy using bipolar radiofrequency and cryothermy ablation with clipping of LA appendage  . 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 APPENDECTOMY    . arm surgery  Right    wrist and elbow  . CARDIAC CATHETERIZATION N/A 05/15/2015   Procedure: Left Heart Cath and Coronary Angiography;  Surgeon: Peter M Martinique, MD;  Location: [place] CV LAB;  Service: Cardiovascular;  Laterality: N/A;  . CARDIAC CATHETERIZATION N/A 11/17/2015   Procedure: Right/Left Heart Cath and Coronary/Graft Angiography;  Surgeon: Adrian Prows, MD;  Location: [place] CV LAB;  Service: Cardiovascular;  Laterality: N/A;  . CARDIOVERSION N/A 09/11/2015   Procedure: CARDIOVERSION;  Surgeon: Lelon Perla, MD;  Location: [facility] ENDOSCOPY;  Service: Cardiovascular;  Laterality: N/A;  . CHEST TUBE INSERTION  11/21/2015   Procedure: LEFT CHEST TUBE INSERTION;  Surgeon: Rexene Alberts, MD;  Location: [place];  Service: Open Heart Surgery;;  . CLIPPING OF ATRIAL APPENDAGE  05/29/2015   Procedure: CLIPPING OF LEFT ATRIAL  APPENDAGE;  Surgeon: Rexene Alberts, MD;  Location: [place];  Service: Open Heart Surgery;;  45 AtriClip PRO 145  . CORONARY ARTERY BYPASS GRAFT N/A 05/29/2015   Procedure: CORONARY ARTERY BYPASS GRAFTING (CABG), ON PUMP, TIMES THREE, USING LEFT INTERNAL MAMMARY ARTERY, BILATERAL GREATER SAPHENOUS VEINS HARVESTED ENDOSCOPICALLY;  Surgeon: Rexene Alberts, MD;  Location: [place];  Service: Open Heart Surgery;  Laterality: N/A;  . FRACTURE SURGERY     right wrist  . HERNIA REPAIR    . MAZE N/A 05/29/2015   Procedure: MAZE;  Surgeon: Rexene Alberts, MD;  Location: [place];  Service: Open Heart Surgery;  Laterality: N/A;  Complete Bi-Atrial Lesion set with Ablation and Cryothermy  . MITRAL VALVE REPAIR Right 11/21/2015   Procedure: MINIMALLY INVASIVE REOPERATION FOR MITRAL VALVE REPAIR;  Surgeon: Rexene Alberts, MD;  Location: [place];  Service: Open Heart Surgery;  Laterality: Right;  . PERIPHERAL VASCULAR CATHETERIZATION Bilateral 11/17/2015   Procedure: Renal Angiography;  Surgeon: Adrian Prows, MD;  Location: [place]  CV LAB;  Service: Cardiovascular;  Laterality: Bilateral;  . PERIPHERAL VASCULAR CATHETERIZATION N/A 11/17/2015   Procedure: Abdominal Aortogram;  Surgeon: Adrian Prows, MD;  Location: [place] CV LAB;  Service: Cardiovascular;  Laterality: N/A;  . SHOULDER SURGERY Left    clavicular fracture  . TEE WITHOUT CARDIOVERSION N/A 05/29/2015   Procedure: TRANSESOPHAGEAL ECHOCARDIOGRAM (TEE);  Surgeon: Rexene Alberts, MD;  Location: [place];  Service: Open Heart Surgery;  Laterality: N/A;  . TEE WITHOUT CARDIOVERSION N/A 10/30/2015   Procedure: TRANSESOPHAGEAL ECHOCARDIOGRAM (TEE);  Surgeon: Adrian Prows, MD;  Location: [place];  Service: Cardiovascular;  Laterality: N/A;  . TEE WITHOUT CARDIOVERSION N/A 11/21/2015   Procedure: TRANSESOPHAGEAL ECHOCARDIOGRAM (TEE);  Surgeon: Rexene Alberts, MD;  Location: [place];  Service: Open Heart Surgery;  Laterality: N/A;     Family History  Problem Relation Age of Onset  . Cancer Mother   . Other Sister     chf  . Other Sister     Rosalee Kaufman     Social History: Social History   Social History  . Marital status: Married    Spouse name: N/A  . Number of children: N/A  . Years of education: N/A   Occupational History  . licensed Chief Financial Officer     self-employed   Social History Main Topics  .  Smoking status: Never Smoker  . Smokeless tobacco: Never Used  . Alcohol use No  . Drug use: No  . Sexual activity: Not on file   Other Topics Concern  . Not on file   Social History Narrative   Lives with his wife (married 1990).  Former Health visitor. Body building/weight lifting, running for exercise.     Prescriptions Prior to Admission  Medication Sig Dispense Refill Last Dose  . acetaminophen (TYLENOL) 500 MG tablet Take 1,000 mg by mouth every 6 (six) hours as needed for mild pain, moderate pain, fever or headache.    Past Week at Unknown time  . cefTRIAXone (ROCEPHIN) IVPB Inject 2 g into the vein every 12 (twelve) hours. Indication:  Endocarditis + Bacteremia Last Day of Therapy:  07/25/16 Labs - Once weekly:  CBC/D and BMP, Labs - Every other week:  ESR and CRP 78 Units 0 07/18/2016 at Unknown time  . diazepam (VALIUM) 10 MG tablet Take 10 mg by mouth at bedtime as needed for anxiety or sleep.    Past Week at Unknown time  . Multiple  Vitamin (MULTIVITAMIN WITH MINERALS) TABS tablet Take 1 tablet by mouth daily.   Past Week at Unknown time  . ondansetron (ZOFRAN ODT) 8 MG disintegrating tablet Take 1 tablet (8 mg total) by mouth every 8 (eight) hours as needed for nausea or vomiting. 20 tablet 1 Past Month at Unknown time  . oxyCODONE-acetaminophen (PERCOCET) 7.5-325 MG tablet Take 1 tablet by mouth 3 (three) times daily as needed for severe pain.   Past Week at Unknown time  . rivaroxaban (XARELTO) 20 MG TABS tablet Take 1 tablet (20 mg total) by mouth daily with supper. 30 tablet 2 07/16/2016 at 1800  . tiZANidine (ZANAFLEX) 4 MG tablet Take 2-4 mg by mouth at bedtime as needed for muscle spasms.   Past Week at Unknown time  . valsartan (DIOVAN) 160 MG tablet Take 1 tablet (160 mg total) by mouth daily.   Past Week at Unknown time  . zolpidem (AMBIEN) 10 MG tablet Take 10 mg by mouth at bedtime as needed for sleep.    Past Week at Unknown time  . aspirin 325 MG tablet Take  1 tablet (325 mg total) by mouth daily. (Patient not taking: Reported on 07/13/2016)   Not Taking  . atorvastatin (LIPITOR) 40 MG tablet Take 1 tablet (40 mg total) by mouth daily at 6 PM. (Patient not taking: Reported on 07/13/2016)   Not Taking  . metoprolol succinate (TOPROL-XL) 25 MG 24 hr tablet Take 0.5 tablets (12.5 mg total) by mouth daily. (Patient not taking: Reported on 07/13/2016) 90 tablet 1 Not Taking  . predniSONE (DELTASONE) 10 MG tablet Take 1 tablet (10 mg total) by mouth daily. (Patient not taking: Reported on 07/13/2016) 30 tablet 0 Not Taking  . QUEtiapine (SEROQUEL) 25 MG tablet Take 1 tablet (25 mg total) by mouth at bedtime. (Patient not taking: Reported on 07/13/2016) 30 tablet 2 Not Taking     ROS: General: Marked fatigue Eyes: no blurry vision, diplopia, or amaurosis ENT: no sore throat or hearing loss Resp:Shortness of Breath CV: no edema or palpitation  GI: no abdominal pain,has  Diarrhea, no blood. GU: no dysuria, frequency, or hematuria Skin: no rash Neuro: no headache, numbness, tingling, or weakness of extremities Musculoskeletal: Severe chronic back pain Heme: no bleeding, DVT, or easy bruising Endo: no polydipsia or polyuria    Physical Exam: Blood pressure 133/80, pulse (!) 51, temperature 98.3 F (36.8 C), temperature source Oral, resp. rate 20, height 5' 10" (1.778 m), weight 184 lb 1.4 oz (83.5 kg), SpO2 91 %.   General appearance: alert, cooperative, appears stated age and no distress Lungs: faintscattered crackles diffuse, rightbase slightly worse. Chest wall: no tenderness Heart: regular rate and rhythmThere is a  1 to 2/6 mid systolic murmur in the apex.  No gallop or rub Abdomen: soft, non-tender; bowel sounds normal; no masses,  no organomegaly Extremities: extremities normal, atraumatic, no cyanosis or edema Skin: Skin color, texture, turgor normal. No rashes or lesions Neurologic: Grossly normal  Labs:   Lab Results  Component Value Date    WBC 26.4 (H) 07/21/2016   HGB 10.0 (L) 07/21/2016   HCT 31.1 (L) 07/21/2016   MCV 93.7 07/21/2016   PLT 74 (L) 07/21/2016    Recent Labs Lab 07/21/16 0512  NA 134*  K 3.7  CL 102  CO2 23  BUN 43*  CREATININE 2.31*  CALCIUM 8.2*  PROT 5.8*  BILITOT 1.3*  ALKPHOS 67  ALT 232*  AST 93*  GLUCOSE 104*  Lipid Panel     Component Value Date/Time   CHOL 170 06/14/2016 0235   TRIG 133 06/14/2016 0235   HDL 24 (L) 06/14/2016 0235   CHOLHDL 7.1 06/14/2016 0235   VLDL 27 06/14/2016 0235   LDLCALC 119 (H) 06/14/2016 0235    BNP (last 3 results)  Recent Labs  11/05/15 0418 11/06/15 0334 06/11/16 1035  BNP 867.3* 613.6* 259.0*    HEMOGLOBIN A1C Lab Results  Component Value Date   HGBA1C 5.7 (H) 06/14/2016   MPG 117 06/14/2016   TSH  Recent Labs  09/19/15 1139 06/12/16 1118  TSH 1.98 1.071    Radiology: Dg Chest 2 View  Result Date: 07/19/2016 CLINICAL DATA:  Weakness and fatigue EXAM: CHEST  2 VIEW COMPARISON:  June 11, 2016 FINDINGS: There is airspace consolidation in the right lower lobe with right pleural effusion. The left lung is clear. Heart is mildly enlarged with pulmonary venous hypertension. No adenopathy. Central catheter tip is in superior vena cava. Patient is status post coronary artery bypass grafting. There is a left atrial appendage clamp. There is degenerative change in the lower thoracic region. There is a small benign appearing cystic area in the right proximal humerus. IMPRESSION: Right base consolidation consistent with pneumonia with right pleural effusion. Left lung clear. There is pulmonary vascular congestion. No interstitial edema. Central catheter tip in superior vena cava. No evident pneumothorax. Electronically Signed   By: Lowella Grip III M.D.   On: 07/19/2016 10:23   US Scrotum  Result Date: 07/20/2016 CLINICAL DATA:  One week history of scrotal pain EXAM: SCROTAL ULTRASOUND DOPPLER ULTRASOUND OF THE TESTICLES TECHNIQUE:  Complete ultrasound examination of the testicles, epididymis, and other scrotal structures was performed. Color and spectral Doppler ultrasound were also utilized to evaluate blood flow to the testicles. COMPARISON:  None. FINDINGS: Right testicle Measurements: 5.2 x 4.0 x 3.7 cm. The testicular parenchyma is diffusely inhomogeneous with areas of relative hyperechoic and hypoechoic parenchyma. No well-defined mass seen. No hyperemia seen on the right. Left testicle Measurements: 4.9 x 3.8 x 3.8 cm. Testicular parenchyma is diffusely inhomogeneous with areas of relative hyperechoic and hypoechoic testicular parenchyma. Appearance is similar to the contralateral side. No well-defined mass or hyperemia seen on the left. Right epididymis:  Normal in size and appearance. Left epididymis:  Normal in size and appearance. Hydrocele:  Moderate hydroceles bilaterally. Varicocele: Small varicocele on the left. No varicocele on the right. Pulsed Doppler interrogation of both testes demonstrates normal low resistance arterial and venous waveforms bilaterally. No scrotal abscess or scrotal wall thickening. IMPRESSION: Diffusely inhomogeneous testicular parenchyma bilaterally without well-defined mass on either side. No hyperemia. This appearance raises concern for infiltrative type lesions within each testis. 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 More infiltrative type lesions such is extraosseous multiple myeloma or lymphoma can involve both testes simultaneously and must be of concern. The lack of hyperemia makes bilateral orchitis seen less likely, although the inhomogeneous appearance of the testicular parenchyma could be due to infectious etiology. Moderate hydroceles bilaterally. No extratesticular mass.  No torsion on either side. These results will be called to the ordering clinician or representative by the Radiologist Assistant, and communication documented in the PACS or zVision Dashboard. Electronically  Signed   By: Lowella Grip III M.D.   On: 07/20/2016 16:32   US Renal  Result Date: 07/20/2016 CLINICAL DATA:  Elevated creatinine and BUN, history of kidney stones, hypertension EXAM: RENAL / URINARY TRACT ULTRASOUND COMPLETE COMPARISON:  CT abdomen pelvis  of 11/12/2015 FINDINGS: Right Kidney: Length: 11.4 cm. No hydronephrosis is seen. No renal calculi are evident. Left Kidney: Length: 11.2 cm. No hydronephrosis is seen. There is a nonobstructing calculus in the mid lower kidney of 7 mm in diameter. Also, there is a rounded structure which is hypoechogenic with some internal echoes. No blood flow is demonstrated and there is some through transmission. This probably represents a complex cyst, but further assessment with CT or preferably MRI would be recommended. Bladder: The urinary bladder is unremarkable other than slight prominence of the prostate indenting the posterior urinary bladder. IMPRESSION: 1. Nonobstructing left renal calculus of 7 mm in diameter. 2. Probable complex cyst in the mid left kidney but further assessment with CT or preferably MRI would be recommended. 3. Prominent prostate. Electronically Signed   By: Ivar Drape M.D.   On: 07/20/2016 16:38   [place] Art/ven Flow Abd Pelv Doppler  Result Date: 07/20/2016 CLINICAL DATA:  One week history of scrotal pain EXAM: SCROTAL ULTRASOUND DOPPLER ULTRASOUND OF THE TESTICLES TECHNIQUE: Complete ultrasound examination of the testicles, epididymis, and other scrotal structures was performed. Color and spectral Doppler ultrasound were also utilized to evaluate blood flow to the testicles. COMPARISON:  None. FINDINGS: Right testicle Measurements: 5.2 x 4.0 x 3.7 cm. The testicular parenchyma is diffusely inhomogeneous with areas of relative hyperechoic and hypoechoic parenchyma. No well-defined mass seen. No hyperemia seen on the right. Left testicle Measurements: 4.9 x 3.8 x 3.8 cm. Testicular parenchyma is diffusely inhomogeneous with areas of  relative hyperechoic and hypoechoic testicular parenchyma. Appearance is similar to the contralateral side. No well-defined mass or hyperemia seen on the left. Right epididymis:  Normal in size and appearance. Left epididymis:  Normal in size and appearance. Hydrocele:  Moderate hydroceles bilaterally. Varicocele: Small varicocele on the left. No varicocele on the right. Pulsed Doppler interrogation of both testes demonstrates normal low resistance arterial and venous waveforms bilaterally. No scrotal abscess or scrotal wall thickening. IMPRESSION: Diffusely inhomogeneous testicular parenchyma bilaterally without well-defined mass on either side. No hyperemia. This appearance raises concern for infiltrative type lesions within each testis. 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 More infiltrative type lesions such is extraosseous multiple myeloma or lymphoma can involve both testes simultaneously and must be of concern. The lack of hyperemia makes bilateral orchitis seen less likely, although the inhomogeneous appearance of the testicular parenchyma could be due to infectious etiology. Moderate hydroceles bilaterally. No extratesticular mass.  No torsion on either side. These results will be called to the ordering clinician or representative by the Radiologist Assistant, and communication documented in the PACS or zVision Dashboard. Electronically Signed   By: Lowella Grip III M.D.   On: 07/20/2016 16:32    Scheduled Meds: . multivitamin with minerals  1 tablet Oral Daily  . piperacillin-tazobactam (ZOSYN)  IV  3.375 g Intravenous Q8H  . saccharomyces boulardii  250 mg Oral BID  . sodium chloride flush  3 mL Intravenous Q12H   Continuous Infusions: . heparin 2,100 Units/hr (07/21/16 0637)   PRN Meds:.acetaminophen **OR** acetaminophen, albuterol, ondansetron **OR** ondansetron (ZOFRAN) IV, oxyCODONE-acetaminophen, sodium chloride flush, zolpidem   Office Echocardiogram 07/07/2016: Left  ventricle cavity is normal in size. Mild asymmetric hypertrophy of the left ventricle. Normal global wall motion. Visual EF is 50-55%. Left atrial cavity is severely dilated at 5.7 cm. Right atrial cavity is severely dilated. Mild (Grade I) aortic regurgitation. MV ring noted. A large sized non-mobile vegetation is present on the anterior mitral valve leaflet measuring  1.5x1.3 cm. Appearence of chronic vegetation with echo bright appearence.  Severe mitral valve stenosis by gradients, but visually appears to be mild. Mitral valve peak pressure gradient of 30.5 and mean gradient of 13.5 mmHg, calculated mitral valve area  2.0  cm. Trace mitral regurgitation. Mild tricuspid regurgitation. Moderate pulmonary hypertension. Pulmonary artery systolic pressure is estimated at 42 mm Hg. IVC is dilated with poor inspiration collapse consistent with elevated right atrial pressure.  EKG 06/11/2016: Sinus tachycardia at the rate of 98 bpm, first-degree AV block, cannot exclude inferior infarct old.  Incomplete right branch block.  PVC.  EKG 06/11/2016: Normal sinus rhythm/sinus tachycardia at the rate of 100 bpm, normal axis. No evidence of ischemia, normal EKG. PVC.  Echo: 06/13/2016: Normal LV systolic function. Mitral valve, especially the anterior mitral leaflet is severely thickened and appears myxomatous with suspicion for vegetation, Measures approximately 1.5-2 cm x 1 cm. The vegetation is fixed. Posterior mitral leaflet is restricted in movement. There is moderate to severe mitral stenosis with a mean gradient of 22 mmHg, mitral valve area of 1.15 cm. Left atrium was moderate to severely dilated, moderate pulmonary hypertension, PA pressure 45 mmHg.  ASSESSMENT AND PLAN:  1. Native valve endocarditis 2. History of minimally invasive mitral valve repair on 11/21/2015 3. Atherosclerosis of native coronary artery of native heart with angina pectoris S/P CABG x 3 on 05/29/2015 withLIMA to LAD, SVG to OM2,  SVG to RCA; and Maze procedure and clipping of left atrial appendage. 4. Paroxysmal atrial fibrillation and atrial flutter, S/P Maze procedure and left atrial appendage closure during CABG, presently on Xarelto. Continue the same. CHA2DS2-VASCScore: Risk Score 4.0 Yearly risk of stroke 4%  5. Hypercoagulable state, primary. On long-term anticoagulation with Coumadin. 6. Hypertension 7. Hyperlipidemia 8. Acute on chronic renal failure stage 3  Rec: No change in physical exam. Do not think he needs TEE for evaluation of MV vegetation unless recurrence of bacteremia. Antibiotic therapy per ID. I do not suspect he has acute decompensated heart failure. Extremely complex and difficult situation,Now has new testicle or mass which may need further evaluation  I will leave it to the specialty of oncology and infectious disease for now. I called his wife and discussed the phone that he is not in acute decompensated heart failure.  Adrian Prows, MD 07/21/2016, 9:18 AM [facility]. [place] Pager: [phone] Office: [phone] If no answer Cell [phone]

## 2016-07-21 NOTE — Progress Notes (Signed)
ANTICOAGULATION CONSULT NOTE - Follow Up Consult  Pharmacy Consult for Heparin Indication: atrial fibrillation  Allergies  Allergen Reactions  . Amitriptyline Shortness Of Breath  . Serotonin Reuptake Inhibitors (Ssris) Nausea And Vomiting    Patient Measurements: Height: 5\' 10"  (177.8 cm) Weight: 184 lb 1.4 oz (83.5 kg) IBW/kg (Calculated) : 73 Heparin Dosing Weight:   Vital Signs: Temp: 98.3 F (36.8 C) (03/14 0428) Temp Source: Oral (03/14 0428) BP: 133/80 (03/14 0428) Pulse Rate: 51 (03/14 0428)  Labs:  Recent Labs  07/19/16 1016 07/19/16 1608  07/20/16 0353 07/20/16 1000 07/20/16 2013 07/21/16 0512  HGB 10.3*  --   --  9.8*  --   --  10.0*  HCT 31.1*  --   --  29.5*  --   --  31.1*  PLT 72*  --   --  71*  --   --  PENDING  APTT  --  31  --   --   --   --   --   LABPROT  --  18.7*  --   --   --   --   --   INR  --  1.55  --   --   --   --   --   HEPARINUNFRC  --  <0.10*  < >  --  <0.10* 0.18* 0.17*  CREATININE 2.32*  --   --  2.27*  --   --  2.31*  < > = values in this interval not displayed.  Estimated Creatinine Clearance: 32 mL/min (by C-G formula based on SCr of 2.31 mg/dL (H)).   Medications:  Infusions:  . heparin 1,900 Units/hr (07/20/16 2145)    Assessment: Patient with heparin level still below goal.  No heparin issues per RN.  Goal of Therapy:  Heparin level 0.3-0.7 units/ml Monitor platelets by anticoagulation protocol: Yes   Plan:  Increase heparin to 2100 units/hr Recheck level at [address] [place] 07/21/2016,6:28 AM

## 2016-07-21 NOTE — Progress Notes (Signed)
PROGRESS NOTE    Peter Bruce  KPT:[id number] DOB: Apr 09, 1951 DOA: 07/19/2016 PCP: Adrian Prows, MD    Brief Narrative:  66 yo male with recent strept viridans endocarditis and meningitis. Patient presented with nausea, vomiting, diarrhea and generalized weakness. On the initial examination he was noted to be dehydrated, decreased breath sounds at bases with bibasilar rales, more right than left. Chest film with right lower lobe infiltrate. Admitted for health care associated pneumonia.     Assessment & Plan:   Principal Problem:   HCAP (healthcare-associated pneumonia) Active Problems:   Paroxysmal atrial fibrillation (HCC)   Dyslipidemia   Respiratory failure with hypoxia (HCC)   Current use of long term anticoagulation   Chronic diastolic CHF (congestive heart failure) (HCC)   Essential hypertension   Cancer of base of tongue (HCC)   Streptococcal bacteremia   Prosthetic valve endocarditis (HCC)   Coronary artery disease involving coronary bypass graft of native heart without angina pectoris   Benign essential HTN   Leukocytosis   Anemia of chronic disease   AKI (acute kidney injury) ([place])   Dehydration  1. Health care associated pneumonia. Chest film personally reviewed noted infiltrate on the right lower lobe, will continue antibiotic therapy with Zosyn #2. Will continue oxymetry monitoring and supplemental 02 per [place], target 02 saturation more then 92%. Continue albuterol as needed. Wbc elevated at 26.   2. AKI. Renal function with cr at 2.31 and 2,27 with K at 3,7. Will follow on renal panel in am, holding on IV fluids for now, avoid hypotension and nephrotoxic medications.   3. 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 On IV zosyn, will continue to monitor cell count and cultures. No further IV antibiotics planned at discharge.   4. Paroxysmal atrial fibrillation. Patient on heparin for anticoagulation, rate controlled,   5. HTN. Continue to hold on antihypertensive  agents to prevent hypotension.   6. 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 No current therapy  7. Positive c diff antigen but negative toxin. Patient with no significant diarrhea, will continue to hold therapy, suspected not active infection. Will continue to follow cell count and symptoms.    DVT prophylaxis: heparin  Code Status: full  Family Communication: No family at the bedside  Disposition Plan: home   Consultants:   I.D  Procedures:     Antimicrobials:  zosyn   Subjective: No dyspnea or chest pain. Patient feeling very weak. Had one episode of diarrhea last night, no nausea or vomiting, no fever or chills.   Objective: Vitals:   07/20/16 0456 07/20/16 1348 07/20/16 2137 07/21/16 0428  BP:  96/64 118/84 133/80  Pulse:  92 73 (!) 51  Resp:  (!) _0 Temp:  97.9 F (36.6 C) 98.5 F (36.9 C) 98.3 F (36.8 C)  TempSrc:  Axillary Oral Oral  SpO2:  92% 100% 91%  Weight: 82.4 kg (181 lb 10.5 oz)   83.5 kg (184 lb 1.4 oz)  Height:        Intake/Output Summary (Last 24 hours) at 07/21/16 0952 Last data filed at 07/21/16 0600  Gross per 24 hour  Intake             2058 ml  Output              525 ml  Net             1533 ml   Filed Weights   07/19/16 1355 07/20/16 0456 07/21/16 0428  Weight: 81.6 kg (179 lb 14.3 oz) 82.4 kg (181 lb 10.5 oz) 83.5 kg (184 lb 1.4 oz)    Examination:  General exam: deconditioned and ill looking appearing E ENT: Positive pallor, oral mucosa dry, no icterus.  Respiratory system: Clear to auscultation. Respiratory effort normal. Mild decreased breath sounds at bases with scattered rales, more right than left.  Cardiovascular system: S1 & S2 heard, RRR. No JVD, murmurs, rubs, gallops or clicks. No pedal edema. Gastrointestinal system: Abdomen is nondistended, soft and nontender. No organomegaly or masses felt. Normal bowel sounds heard. Central nervous system: Alert and oriented. No focal neurological deficits. Extremities:  Symmetric 5 x 5 power. Skin: No rashes, lesions or ulcers    Data Reviewed: I have personally reviewed following labs and imaging studies  CBC:  Recent Labs Lab 07/19/16 1016 07/20/16 0353 07/21/16 0512  WBC 27.8* 27.4* 26.4*  NEUTROABS 24.4*  --   --   HGB 10.3* 9.8* 10.0*  HCT 31.1* 29.5* 31.1*  MCV 94.5 94.6 93.7  PLT 72* 71* 74*   Basic Metabolic Panel:  Recent Labs Lab 07/19/16 1016 07/20/16 0353 07/21/16 0512  NA 134* 139 134*  K 4.3 4.0 3.7  CL 101 106 102  CO2 _0 GLUCOSE 125* 108* 104*  BUN 43* 42* 43*  CREATININE 2.32* 2.27* 2.31*  CALCIUM 8.6* 8.4* 8.2*   GFR: Estimated Creatinine Clearance: 32 mL/min (by C-G formula based on SCr of 2.31 mg/dL (H)). Liver Function Tests:  Recent Labs Lab 07/19/16 1016 07/21/16 0512  AST 237* 93*  ALT 358* 232*  ALKPHOS 81 67  BILITOT 0.9 1.3*  PROT 6.1* 5.8*  ALBUMIN 3.3* 2.8*   No results for input(s): LIPASE, AMYLASE in the last 168 hours. No results for input(s): AMMONIA in the last 168 hours. Coagulation Profile:  Recent Labs Lab 07/19/16 1608  INR 1.55   Cardiac Enzymes: No results for input(s): CKTOTAL, CKMB, CKMBINDEX, TROPONINI in the last 168 hours. BNP (last 3 results) No results for input(s): PROBNP in the last 8760 hours. HbA1C: No results for input(s): HGBA1C in the last 72 hours. CBG: No results for input(s): GLUCAP in the last 168 hours. Lipid Profile: No results for input(s): CHOL, HDL, LDLCALC, TRIG, CHOLHDL, LDLDIRECT in the last 72 hours. Thyroid Function Tests: No results for input(s): TSH, T4TOTAL, FREET4, T3FREE, THYROIDAB in the last 72 hours. Anemia Panel: No results for input(s): VITAMINB12, FOLATE, FERRITIN, TIBC, IRON, RETICCTPCT in the last 72 hours. Sepsis Labs:  Recent Labs Lab 07/19/16 1026 07/20/16 0353  PROCALCITON  --  0.90  LATICACIDVEN 2.09*  --     Recent Results (from the past 240 hour(s))  Blood culture (routine x 2)     Status: None  (Preliminary result)   Collection Time: 07/19/16 11:56 AM  Result Value Ref Range Status   Specimen Description BLOOD LEFT ANTECUBITAL  Final   Special Requests IN PEDIATRIC BOTTLE 3.5CC  Final   Culture   Final    NO GROWTH 1 DAY Performed at [facility], [place] [address], [place], [place] [address]    Report Status PENDING  Incomplete  Urine culture     Status: None   Collection Time: 07/19/16 11:56 AM  Result Value Ref Range Status   Specimen Description URINE, RANDOM  Final   Special Requests NONE  Final   Culture   Final    NO GROWTH Performed at [facility], [address] [place], [place] [address]  Report Status 07/20/2016 FINAL  Final  Blood culture (routine x 2)     Status: None (Preliminary result)   Collection Time: 07/19/16 12:03 PM  Result Value Ref Range Status   Specimen Description BLOOD LEFT HAND  Final   Special Requests IN PEDIATRIC BOTTLE 3CC  Final   Culture   Final    NO GROWTH 1 DAY Performed at [facility], [place] [address], [place], [place] [address]    Report Status PENDING  Incomplete  C difficile quick scan w PCR reflex     Status: Abnormal   Collection Time: 07/21/16  6:46 AM  Result Value Ref Range Status   C Diff antigen POSITIVE (A) NEGATIVE Final   C Diff toxin NEGATIVE NEGATIVE Final   C Diff interpretation Results are indeterminate. See PCR results.  Final         Radiology Studies: Dg Chest 2 View  Result Date: 07/19/2016 CLINICAL DATA:  Weakness and fatigue EXAM: CHEST  2 VIEW COMPARISON:  June 11, 2016 FINDINGS: There is airspace consolidation in the right lower lobe with right pleural effusion. The left lung is clear. Heart is mildly enlarged with pulmonary venous hypertension. No adenopathy. Central catheter tip is in superior vena cava. Patient is status post coronary artery bypass grafting. There is a left atrial appendage clamp. There is degenerative change in the lower thoracic region. There is a small  benign appearing cystic area in the right proximal humerus. IMPRESSION: Right base consolidation consistent with pneumonia with right pleural effusion. Left lung clear. There is pulmonary vascular congestion. No interstitial edema. Central catheter tip in superior vena cava. No evident pneumothorax. Electronically Signed   By: Lowella Grip III M.D.   On: 07/19/2016 10:23   US Scrotum  Result Date: 07/20/2016 CLINICAL DATA:  One week history of scrotal pain EXAM: SCROTAL ULTRASOUND DOPPLER ULTRASOUND OF THE TESTICLES TECHNIQUE: Complete ultrasound examination of the testicles, epididymis, and other scrotal structures was performed. Color and spectral Doppler ultrasound were also utilized to evaluate blood flow to the testicles. COMPARISON:  None. FINDINGS: Right testicle Measurements: 5.2 x 4.0 x 3.7 cm. The testicular parenchyma is diffusely inhomogeneous with areas of relative hyperechoic and hypoechoic parenchyma. No well-defined mass seen. No hyperemia seen on the right. Left testicle Measurements: 4.9 x 3.8 x 3.8 cm. Testicular parenchyma is diffusely inhomogeneous with areas of relative hyperechoic and hypoechoic testicular parenchyma. Appearance is similar to the contralateral side. No well-defined mass or hyperemia seen on the left. Right epididymis:  Normal in size and appearance. Left epididymis:  Normal in size and appearance. Hydrocele:  Moderate hydroceles bilaterally. Varicocele: Small varicocele on the left. No varicocele on the right. Pulsed Doppler interrogation of both testes demonstrates normal low resistance arterial and venous waveforms bilaterally. No scrotal abscess or scrotal wall thickening. IMPRESSION: Diffusely inhomogeneous testicular parenchyma bilaterally without well-defined mass on either side. No hyperemia. This appearance raises concern for infiltrative type lesions within each testis. 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 More infiltrative type lesions such is  extraosseous multiple myeloma or lymphoma can involve both testes simultaneously and must be of concern. The lack of hyperemia makes bilateral orchitis seen less likely, although the inhomogeneous appearance of the testicular parenchyma could be due to infectious etiology. Moderate hydroceles bilaterally. No extratesticular mass.  No torsion on either side. These results will be called to the ordering clinician or representative by the Radiologist Assistant, and communication documented in the PACS or zVision Dashboard. Electronically Signed  By: Lowella Grip III M.D.   On: 07/20/2016 16:32   US Renal  Result Date: 07/20/2016 CLINICAL DATA:  Elevated creatinine and BUN, history of kidney stones, hypertension EXAM: RENAL / URINARY TRACT ULTRASOUND COMPLETE COMPARISON:  CT abdomen pelvis of 11/12/2015 FINDINGS: Right Kidney: Length: 11.4 cm. No hydronephrosis is seen. No renal calculi are evident. Left Kidney: Length: 11.2 cm. No hydronephrosis is seen. There is a nonobstructing calculus in the mid lower kidney of 7 mm in diameter. Also, there is a rounded structure which is hypoechogenic with some internal echoes. No blood flow is demonstrated and there is some through transmission. This probably represents a complex cyst, but further assessment with CT or preferably MRI would be recommended. Bladder: The urinary bladder is unremarkable other than slight prominence of the prostate indenting the posterior urinary bladder. IMPRESSION: 1. Nonobstructing left renal calculus of 7 mm in diameter. 2. Probable complex cyst in the mid left kidney but further assessment with CT or preferably MRI would be recommended. 3. Prominent prostate. Electronically Signed   By: Ivar Drape M.D.   On: 07/20/2016 16:38   [place] Art/ven Flow Abd Pelv Doppler  Result Date: 07/20/2016 CLINICAL DATA:  One week history of scrotal pain EXAM: SCROTAL ULTRASOUND DOPPLER ULTRASOUND OF THE TESTICLES TECHNIQUE: Complete ultrasound  examination of the testicles, epididymis, and other scrotal structures was performed. Color and spectral Doppler ultrasound were also utilized to evaluate blood flow to the testicles. COMPARISON:  None. FINDINGS: Right testicle Measurements: 5.2 x 4.0 x 3.7 cm. The testicular parenchyma is diffusely inhomogeneous with areas of relative hyperechoic and hypoechoic parenchyma. No well-defined mass seen. No hyperemia seen on the right. Left testicle Measurements: 4.9 x 3.8 x 3.8 cm. Testicular parenchyma is diffusely inhomogeneous with areas of relative hyperechoic and hypoechoic testicular parenchyma. Appearance is similar to the contralateral side. No well-defined mass or hyperemia seen on the left. Right epididymis:  Normal in size and appearance. Left epididymis:  Normal in size and appearance. Hydrocele:  Moderate hydroceles bilaterally. Varicocele: Small varicocele on the left. No varicocele on the right. Pulsed Doppler interrogation of both testes demonstrates normal low resistance arterial and venous waveforms bilaterally. No scrotal abscess or scrotal wall thickening. IMPRESSION: Diffusely inhomogeneous testicular parenchyma bilaterally without well-defined mass on either side. No hyperemia. This appearance raises concern for infiltrative type lesions within each testis. 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 More infiltrative type lesions such is extraosseous multiple myeloma or lymphoma can involve both testes simultaneously and must be of concern. The lack of hyperemia makes bilateral orchitis seen less likely, although the inhomogeneous appearance of the testicular parenchyma could be due to infectious etiology. Moderate hydroceles bilaterally. No extratesticular mass.  No torsion on either side. These results will be called to the ordering clinician or representative by the Radiologist Assistant, and communication documented in the PACS or zVision Dashboard. Electronically Signed   By: Lowella Grip III M.D.   On: 07/20/2016 16:32        Scheduled Meds: . multivitamin with minerals  1 tablet Oral Daily  . piperacillin-tazobactam (ZOSYN)  IV  3.375 g Intravenous Q8H  . saccharomyces boulardii  250 mg Oral BID  . sodium chloride flush  3 mL Intravenous Q12H   Continuous Infusions: . heparin 2,100 Units/hr (07/21/16 9562)     LOS: 2 days        Mauricio Gerome Apley, MD [facility] Pager [phone]  If 7PM-7AM, please contact night-coverage [email or web address] Password [email or web address] 07/21/2016, 9:52 AM

## 2016-07-21 NOTE — Progress Notes (Signed)
ANTICOAGULATION CONSULT NOTE - Follow Up Consult  Pharmacy Consult for Heparin>xarelto Indication: atrial fibrillation  Allergies  Allergen Reactions  . Amitriptyline Shortness Of Breath  . Serotonin Reuptake Inhibitors (Ssris) Nausea And Vomiting    Patient Measurements: Height: 5\' 10"  (177.8 cm) Weight: 184 lb 1.4 oz (83.5 kg) IBW/kg (Calculated) : 73 Heparin Dosing Weight:   Vital Signs: Temp: 98.3 F (36.8 C) (03/14 0428) Temp Source: Oral (03/14 0428) BP: 133/80 (03/14 0428) Pulse Rate: 51 (03/14 0428)  Labs:  Recent Labs  07/19/16 1016 07/19/16 1608  07/20/16 0353 07/20/16 1000 07/20/16 2013 07/21/16 0512  HGB 10.3*  --   --  9.8*  --   --  10.0*  HCT 31.1*  --   --  29.5*  --   --  31.1*  PLT 72*  --   --  71*  --   --  74*  APTT  --  31  --   --   --   --   --   LABPROT  --  18.7*  --   --   --   --   --   INR  --  1.55  --   --   --   --   --   HEPARINUNFRC  --  <0.10*  < >  --  <0.10* 0.18* 0.17*  CREATININE 2.32*  --   --  2.27*  --   --  2.31*  < > = values in this interval not displayed.  Estimated Creatinine Clearance: 32 mL/min (by C-G formula based on SCr of 2.31 mg/dL (H)).    Assessment and plan: 66 year old male who presents with generalized weakness and fatigue. 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 History of prosthetic mitral valve and CABG. Also has a history of strep viridians endocarditis on home IV ceftriaxone through PICC line until 07/25/2016. Is on Xarelto. Pharmacy consulted to dose heparin for afib.  Now heparin stopped and pharmacy consulted to dose xarelto  07/21/2016 Scr 2.31, CrCl ~ 55mls/min  Goal of Therapy:  xarelto per renal function and indication  Plan:  xarelto 15mg  po once daily  Follow renal function  Dolly Rias RPh 07/21/2016, 1:58 PM Pager [phone]

## 2016-07-21 NOTE — Progress Notes (Addendum)
INFECTIOUS DISEASE PROGRESS NOTE  ID: Peter Bruce is a 66 y.o. male with  Principal Problem:   HCAP (healthcare-associated pneumonia) Active Problems:   Paroxysmal atrial fibrillation (HCC)   Dyslipidemia   Respiratory failure with hypoxia (HCC)   Current use of long term anticoagulation   Chronic diastolic CHF (congestive heart failure) (HCC)   Essential hypertension   Cancer of base of tongue (HCC)   Streptococcal bacteremia   Prosthetic valve endocarditis (HCC)   Coronary artery disease involving coronary bypass graft of native heart without angina pectoris   Benign essential HTN   Leukocytosis   Anemia of chronic disease   AKI (acute kidney injury) ([place])   Dehydration  Subjective: Without complaints. No sob or cough  Abtx:  Anti-infectives    Start     Dose/Rate Route Frequency Ordered Stop   07/20/16 1200  vancomycin (VANCOCIN) IVPB 1000 mg/200 mL premix  Status:  Discontinued     1,000 mg 200 mL/hr over 60 Minutes Intravenous Every 24 hours 07/19/16 1451 07/20/16 1744   07/19/16 2000  piperacillin-tazobactam (ZOSYN) IVPB 3.375 g     3.375 g 12.5 mL/hr over 240 Minutes Intravenous Every 8 hours 07/19/16 1451     07/19/16 1200  piperacillin-tazobactam (ZOSYN) IVPB 3.375 g     3.375 g 12.5 mL/hr over 240 Minutes Intravenous  Once 07/19/16 1149 07/19/16 1301   07/19/16 1200  vancomycin (VANCOCIN) IVPB 1000 mg/200 mL premix     1,000 mg 200 mL/hr over 60 Minutes Intravenous  Once 07/19/16 1149 07/19/16 1408      Medications:  Scheduled: . multivitamin with minerals  1 tablet Oral Daily  . piperacillin-tazobactam (ZOSYN)  IV  3.375 g Intravenous Q8H  . rivaroxaban  15 mg Oral Daily  . saccharomyces boulardii  250 mg Oral BID  . sodium chloride flush  3 mL Intravenous Q12H    Objective: Vital signs in last 24 hours: Temp:  [97.4 F (36.3 C)-98.5 F (36.9 C)] 97.4 F (36.3 C) (03/14 1418) Pulse Rate:  [51-73] 51 (03/14 1418) Resp:  [20] 20 (03/14 1418) BP:  (118-133)/(79-84) 124/79 (03/14 1418) SpO2:  [91 %-100 %] 95 % (03/14 1418) Weight:  [83.5 kg (184 lb 1.4 oz)] 83.5 kg (184 lb 1.4 oz) (03/14 0428)   General appearance: fatigued and no distress Resp: diminished breath sounds anterior - bilateral Cardio: regular rate and rhythm GI: normal findings: bowel sounds normal and soft, non-tender  Lab Results  Recent Labs  07/20/16 0353 07/21/16 0512  WBC 27.4* 26.4*  HGB 9.8* 10.0*  HCT 29.5* 31.1*  NA 139 134*  K 4.0 3.7  CL 106 102  CO2 25 23  BUN 42* 43*  CREATININE 2.27* 2.31*   Liver Panel  Recent Labs  07/19/16 1016 07/21/16 0512  PROT 6.1* 5.8*  ALBUMIN 3.3* 2.8*  AST 237* 93*  ALT 358* 232*  ALKPHOS 81 67  BILITOT 0.9 1.3*   Sedimentation Rate No results for input(s): ESRSEDRATE in the last 72 hours. C-Reactive Protein  Recent Labs  07/20/16 2013  CRP 18.2*    Microbiology: Recent Results (from the past 240 hour(s))  Blood culture (routine x 2)     Status: None (Preliminary result)   Collection Time: 07/19/16 11:56 AM  Result Value Ref Range Status   Specimen Description BLOOD LEFT ANTECUBITAL  Final   Special Requests IN PEDIATRIC BOTTLE 3.5CC  Final   Culture   Final    NO GROWTH 2 DAYS Performed at [facility], [address] [place], [place] [address]    Report Status PENDING  Incomplete  Urine culture     Status: None   Collection Time: 07/19/16 11:56 AM  Result Value Ref Range Status   Specimen Description URINE, RANDOM  Final   Special Requests NONE  Final   Culture   Final    NO GROWTH Performed at [facility], [address] [place], [place] [address]    Report Status 07/20/2016 FINAL  Final  Blood culture (routine x 2)     Status: None (Preliminary result)   Collection Time: 07/19/16 12:03 PM  Result Value Ref Range Status   Specimen Description BLOOD LEFT HAND  Final   Special Requests IN PEDIATRIC BOTTLE 3CC  Final   Culture   Final    NO GROWTH 2  DAYS Performed at [facility], [address] [place], [place] [address]    Report Status PENDING  Incomplete  C difficile quick scan w PCR reflex     Status: Abnormal   Collection Time: 07/21/16  6:46 AM  Result Value Ref Range Status   C Diff antigen POSITIVE (A) NEGATIVE Final   C Diff toxin NEGATIVE NEGATIVE Final   C Diff interpretation Results are indeterminate. See PCR results.  Final  Clostridium Difficile by PCR     Status: Abnormal   Collection Time: 07/21/16  6:46 AM  Result Value Ref Range Status   Toxigenic C Difficile by pcr POSITIVE (A) NEGATIVE Final    Comment: Positive for toxigenic C. difficile with little to no toxin production. Only treat if clinical presentation suggests symptomatic illness. Performed at [facility], [address] [place], [place] [address]     Studies/Results: Us Scrotum  Result Date: 07/20/2016 CLINICAL DATA:  One week history of scrotal pain EXAM: SCROTAL ULTRASOUND DOPPLER ULTRASOUND OF THE TESTICLES TECHNIQUE: Complete ultrasound examination of the testicles, epididymis, and other scrotal structures was performed. Color and spectral Doppler ultrasound were also utilized to evaluate blood flow to the testicles. COMPARISON:  None. FINDINGS: Right testicle Measurements: 5.2 x 4.0 x 3.7 cm. The testicular parenchyma is diffusely inhomogeneous with areas of relative hyperechoic and hypoechoic parenchyma. No well-defined mass seen. No hyperemia seen on the right. Left testicle Measurements: 4.9 x 3.8 x 3.8 cm. Testicular parenchyma is diffusely inhomogeneous with areas of relative hyperechoic and hypoechoic testicular parenchyma. Appearance is similar to the contralateral side. No well-defined mass or hyperemia seen on the left. Right epididymis:  Normal in size and appearance. Left epididymis:  Normal in size and appearance. Hydrocele:  Moderate hydroceles bilaterally. Varicocele: Small varicocele on the left. No varicocele on the right.  Pulsed Doppler interrogation of both testes demonstrates normal low resistance arterial and venous waveforms bilaterally. No scrotal abscess or scrotal wall thickening. IMPRESSION: Diffusely inhomogeneous testicular parenchyma bilaterally without well-defined mass on either side. No hyperemia. This appearance raises concern for infiltrative type lesions within each testis. 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 More infiltrative type lesions such is extraosseous multiple myeloma or lymphoma can involve both testes simultaneously and must be of concern. The lack of hyperemia makes bilateral orchitis seen less likely, although the inhomogeneous appearance of the testicular parenchyma could be due to infectious etiology. Moderate hydroceles bilaterally. No extratesticular mass.  No torsion on either side. These results will be called to the ordering clinician or representative by the Radiologist Assistant, and communication documented in the PACS or zVision Dashboard. Electronically   Signed   By: William  Woodruff III M.D.   On: 07/20/2016 16:32   Us Renal  Result Date: 07/20/2016 CLINICAL DATA:  Elevated creatinine and BUN, history of kidney stones, hypertension EXAM: RENAL / URINARY TRACT ULTRASOUND COMPLETE COMPARISON:  CT abdomen pelvis of 11/12/2015 FINDINGS: Right Kidney: Length: 11.4 cm. No hydronephrosis is seen. No renal calculi are evident. Left Kidney: Length: 11.2 cm. No hydronephrosis is seen. There is a nonobstructing calculus in the mid lower kidney of 7 mm in diameter. Also, there is a rounded structure which is hypoechogenic with some internal echoes. No blood flow is demonstrated and there is some through transmission. This probably represents a complex cyst, but further assessment with CT or preferably MRI would be recommended. Bladder: The urinary bladder is unremarkable other than slight prominence of the prostate indenting the posterior urinary bladder. IMPRESSION: 1. Nonobstructing  left renal calculus of 7 mm in diameter. 2. Probable complex cyst in the mid left kidney but further assessment with CT or preferably MRI would be recommended. 3. Prominent prostate. Electronically Signed   By: Paul  Barry M.D.   On: 07/20/2016 16:38   Us Art/ven Flow Abd Pelv Doppler  Result Date: 07/20/2016 CLINICAL DATA:  One week history of scrotal pain EXAM: SCROTAL ULTRASOUND DOPPLER ULTRASOUND OF THE TESTICLES TECHNIQUE: Complete ultrasound examination of the testicles, epididymis, and other scrotal structures was performed. Color and spectral Doppler ultrasound were also utilized to evaluate blood flow to the testicles. COMPARISON:  None. FINDINGS: Right testicle Measurements: 5.2 x 4.0 x 3.7 cm. The testicular parenchyma is diffusely inhomogeneous with areas of relative hyperechoic and hypoechoic parenchyma. No well-defined mass seen. No hyperemia seen on the right. Left testicle Measurements: 4.9 x 3.8 x 3.8 cm. Testicular parenchyma is diffusely inhomogeneous with areas of relative hyperechoic and hypoechoic testicular parenchyma. Appearance is similar to the contralateral side. No well-defined mass or hyperemia seen on the left. Right epididymis:  Normal in size and appearance. Left epididymis:  Normal in size and appearance. Hydrocele:  Moderate hydroceles bilaterally. Varicocele: Small varicocele on the left. No varicocele on the right. Pulsed Doppler interrogation of both testes demonstrates normal low resistance arterial and venous waveforms bilaterally. No scrotal abscess or scrotal wall thickening. IMPRESSION: Diffusely inhomogeneous testicular parenchyma bilaterally without well-defined mass on either side. No hyperemia. This appearance raises concern for infiltrative type lesions within each testis. 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 More infiltrative type lesions such is extraosseous multiple myeloma or lymphoma can involve both testes simultaneously and must be of concern. The  lack of hyperemia makes bilateral orchitis seen less likely, although the inhomogeneous appearance of the testicular parenchyma could be due to infectious etiology. Moderate hydroceles bilaterally. No extratesticular mass.  No torsion on either side. These results will be called to the ordering clinician or representative by the Radiologist Assistant, and communication documented in the PACS or zVision Dashboard. Electronically Signed   By: William  Woodruff III M.D.   On: 07/20/2016 16:32     Assessment/Plan: Pneumonia, HCAP WBC stable, down slightly afebrile Off vanco  C diff +/- Would not treat, his test pattern suggests colonization He has not had a BM today, is afebrile.  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   ARF Off vanco 24h Cr up slightly (.04), hopefully at peak.   Total days of antibiotics: 37 2 this adm (vanco/zosyn)         Jeffrey Hatcher   Infectious Diseases (pager) [phone] [email or web address] 07/21/2016, 4:54 PM  LOS: 2 days     

## 2016-07-22 DIAGNOSIS — I480 Paroxysmal atrial fibrillation: Secondary | ICD-10-CM

## 2016-07-22 LAB — PROTEIN ELECTROPHORESIS, SERUM
A/G Ratio: 1.1 (ref 0.7–1.7)
ALBUMIN ELP: 2.7 g/dL — AB (ref 2.9–4.4)
ALPHA-1-GLOBULIN: 0.4 g/dL (ref 0.0–0.4)
Alpha-2-Globulin: 0.6 g/dL (ref 0.4–1.0)
BETA GLOBULIN: 0.7 g/dL (ref 0.7–1.3)
Gamma Globulin: 0.8 g/dL (ref 0.4–1.8)
Globulin, Total: 2.5 g/dL (ref 2.2–3.9)
Total Protein ELP: 5.2 g/dL — ABNORMAL LOW (ref 6.0–8.5)

## 2016-07-22 LAB — CBC
HCT: 30.8 % — ABNORMAL LOW (ref 39.0–52.0)
HEMOGLOBIN: 9.7 g/dL — AB (ref 13.0–17.0)
MCH: 29.7 pg (ref 26.0–34.0)
MCHC: 31.5 g/dL (ref 30.0–36.0)
MCV: 94.2 fL (ref 78.0–100.0)
PLATELETS: 77 10*3/uL — AB (ref 150–400)
RBC: 3.27 MIL/uL — AB (ref 4.22–5.81)
RDW: 17.8 % — ABNORMAL HIGH (ref 11.5–15.5)
WBC: 22.3 10*3/uL — AB (ref 4.0–10.5)

## 2016-07-22 LAB — PROCALCITONIN: PROCALCITONIN: 0.58 ng/mL

## 2016-07-22 LAB — CREATININE, SERUM
Creatinine, Ser: 2.31 mg/dL — ABNORMAL HIGH (ref 0.61–1.24)
GFR calc Af Amer: 32 mL/min — ABNORMAL LOW (ref >60)
GFR, EST NON AFRICAN AMERICAN: 28 mL/min — AB (ref >60)

## 2016-07-22 NOTE — Progress Notes (Signed)
Physical Therapy Treatment Patient Details Name: Peter Bruce MRN: [id number] DOB: 05/21/1950 Today's Date: 07/22/2016    History of Present Illness 66 yo male admitted with Pna, confusion. hx of CVA, endocarditis, CAD, PE, A fib, DVt, lupus, CABG, CHF, arthritis, tongue cancer.     PT Comments    Progressing with mobility. Significant pain in scrotum due to edema. Pt agreed to ambulate with therapist and wife's encouragement. He walked ~75 feet with a RW. Distance was limited by pain, general weakness. Discussed d/c plan-both pt and wife declined HHPT f/u. Will continue to follow and progress activity as tolerated.    Follow Up Recommendations  Supervision/Assistance - 24 hour initially once home (pt and wife both politely declined PT follow up after discharge)     Equipment Recommendations  None recommended by PT    Recommendations for Other Services       Precautions / Restrictions Precautions Precautions: Fall Restrictions Weight Bearing Restrictions: No    Mobility  Bed Mobility Overal bed mobility: Modified Independent             General bed mobility comments: increased time. HOB elevated. Pt used bedrail.   Transfers Overall transfer level: Needs assistance Equipment used: Rolling walker (2 wheeled) Transfers: Sit to/from Stand Sit to Stand: Min assist;From elevated surface         General transfer comment: Assist to rise, stabilize. Increased time. VCs hand placement.   Ambulation/Gait Ambulation/Gait assistance: Min assist Ambulation Distance (Feet): 75 Feet Assistive device: Rolling walker (2 wheeled) Gait Pattern/deviations: Step-through pattern;Decreased stride length     General Gait Details: Intermittent assist to stabilize. 3 brief standing rest breaks taken/needed.    Stairs            Wheelchair Mobility    Modified Rankin (Stroke Patients Only)       Balance Overall balance assessment: Needs assistance            Standing balance-Leahy Scale: Poor Standing balance comment: requiring RW currently                    Cognition Arousal/Alertness: Awake/alert Behavior During Therapy: WFL for tasks assessed/performed Overall Cognitive Status: Within Functional Limits for tasks assessed                      Exercises      General Comments        Pertinent Vitals/Pain Pain Assessment: 0-10 Faces Pain Scale: Hurts whole lot Pain Location: scrotum, back Pain Descriptors / Indicators: Grimacing;Sore;Tender Pain Intervention(s): Limited activity within patient's tolerance;Repositioned    Home Living                      Prior Function            PT Goals (current goals can now be found in the care plan section) Progress towards PT goals: Progressing toward goals    Frequency    Min 3X/week      PT Plan Current plan remains appropriate    Co-evaluation             End of Session Equipment Utilized During Treatment: Gait belt Activity Tolerance: Patient limited by fatigue;Patient limited by pain Patient left: in bed;with call bell/phone within reach;with family/visitor present   PT Visit Diagnosis: Muscle weakness (generalized) (M62.81);Difficulty in walking, not elsewhere classified (R26.2)     Time: 1340-1402 PT Time Calculation (min) (ACUTE ONLY): 22 min  Charges:  $Gait  Training: 8-22 mins                    G Codes:       Weston Anna, MPT Pager: [phone]

## 2016-07-22 NOTE — Consult Note (Signed)
   [facility] CM Inpatient Consult   07/22/2016  Jimi Schappert [birth year] [phone]    [place] Management follow up visit. Spoke with patient's wife at bedside about [place] Management program services. Mrs. Beyene pleasantly declines [facility] Care Management program. States " I handle all of his healthcare affairs. I know what to do and we have close contact with his doctors. I work from home with my business so I am able to be home with him". Accepted [facility] Care Management brochure with contact information to call in case she changes her mind in the future. Appreciative of visit. Will make inpatient RNCM aware that patient's wife declined [place] Management program.   Marthenia Rolling, [place], RN,BSN [facility] Liaison [phone]

## 2016-07-22 NOTE — Progress Notes (Signed)
OT Cancellation Note  Patient Details Name: Peter Bruce MRN: [id number] DOB: 27-Dec-1950   Cancelled Treatment:    Reason Eval/Treat Not Completed: Fatigue/lethargy limiting ability to participate  Pt had just finished with PT Will check back on pt another time.  [name], [place]  Payton Mccallum D 07/22/2016, 2:25 PM

## 2016-07-22 NOTE — Progress Notes (Signed)
Pharmacy Antibiotic Note  Peter Bruce is a 66 y.o. male admitted on 07/19/2016 with pneumonia.  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 History of prosthetic mitral valve and CABG. 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 Is on Xarelto. Pharmacy has been consulted for vanc and zosyn dosing.  Today, 07/22/2016  Day #4 antibiotics (Day #38 for viridans strep endocarditis)  AKI - SCr unchanged  PCT = 0.58 (trending down)  WBC better   C. Diff    Plan: Continue Zosyn 3.375g IV Q8H infused over 4hrs. Follow renal function Pharmacy will follow at distance  Height: 5\' 10"  (177.8 cm) Weight: 186 lb 4.6 oz (84.5 kg) IBW/kg (Calculated) : 73  Temp (24hrs), Avg:97.5 F (36.4 C), Min:97.4 F (36.3 C), Max:97.6 F (36.4 C)   Recent Labs Lab 07/19/16 1016 07/19/16 1026 07/20/16 0353 07/21/16 0512 07/22/16 0429  WBC 27.8*  --  27.4* 26.4* 22.3*  CREATININE 2.32*  --  2.27* 2.31* 2.31*  LATICACIDVEN  --  2.09*  --   --   --     Estimated Creatinine Clearance: 32 mL/min (A) (by C-G formula based on SCr of 2.31 mg/dL (H)).    Allergies  Allergen Reactions  . Amitriptyline Shortness Of Breath  . Serotonin Reuptake Inhibitors ([place]sris) Nausea And Vomiting    Antimicrobials this admission: 3/12 vanc >> 3/12 zosyn >>  Microbiology results: 3/12 BCx: ngtd 3/12 UCx: ngf 3/12 CDiff: Ag +, toxin neg (colonized)  Thank you for allowing pharmacy to be a part of this patient's care.  Doreene Eland, PharmD, BCPS.   Pager: [phone] 07/22/2016 11:49 AM

## 2016-07-22 NOTE — Progress Notes (Signed)
INFECTIOUS DISEASE PROGRESS NOTE  ID: Peter Bruce is a 66 y.o. male with  Principal Problem:   HCAP (healthcare-associated pneumonia) Active Problems:   Paroxysmal atrial fibrillation (HCC)   Dyslipidemia   Respiratory failure with hypoxia (HCC)   Current use of long term anticoagulation   Chronic diastolic CHF (congestive heart failure) (HCC)   Essential hypertension   Cancer of base of tongue (HCC)   Streptococcal bacteremia   Prosthetic valve endocarditis (HCC)   Coronary artery disease involving coronary bypass graft of native heart without angina pectoris   Benign essential HTN   Leukocytosis   Anemia of chronic disease   AKI (acute kidney injury) ([place])   Dehydration   Lobar pneumonia (HCC)  Subjective: 2 soft BM overnight, no diarrhea.   Abtx:  Anti-infectives    Start     Dose/Rate Route Frequency Ordered Stop   07/20/16 1200  vancomycin (VANCOCIN) IVPB 1000 mg/200 mL premix  Status:  Discontinued     1,000 mg 200 mL/hr over 60 Minutes Intravenous Every 24 hours 07/19/16 1451 07/20/16 1744   07/19/16 2000  piperacillin-tazobactam (ZOSYN) IVPB 3.375 g     3.375 g 12.5 mL/hr over 240 Minutes Intravenous Every 8 hours 07/19/16 1451     07/19/16 1200  piperacillin-tazobactam (ZOSYN) IVPB 3.375 g     3.375 g 12.5 mL/hr over 240 Minutes Intravenous  Once 07/19/16 1149 07/19/16 1301   07/19/16 1200  vancomycin (VANCOCIN) IVPB 1000 mg/200 mL premix     1,000 mg 200 mL/hr over 60 Minutes Intravenous  Once 07/19/16 1149 07/19/16 1408      Medications:  Scheduled: . multivitamin with minerals  1 tablet Oral Daily  . piperacillin-tazobactam (ZOSYN)  IV  3.375 g Intravenous Q8H  . rivaroxaban  15 mg Oral Daily  . saccharomyces boulardii  250 mg Oral BID  . sodium chloride flush  3 mL Intravenous Q12H    Objective: Vital signs in last 24 hours: Temp:  [97.4 F (36.3 C)-97.6 F (36.4 C)] 97.4 F (36.3 C) (03/15 6438) Pulse Rate:  [51-88] 88 (03/14 1938) Resp:   [18-21] 18 (03/15 0608) BP: (120-124)/(79-88) 120/88 (03/15 0608) SpO2:  [94 %-95 %] 95 % (03/15 0608) Weight:  [84.5 kg (186 lb 4.6 oz)] 84.5 kg (186 lb 4.6 oz) (03/15 3779)   General appearance: alert, cooperative and no distress Resp: clear to auscultation bilaterally Cardio: regular rate and rhythm GI: normal findings: bowel sounds normal and soft, non-tender  Lab Results  Recent Labs  07/20/16 0353 07/21/16 0512 07/22/16 0429  WBC 27.4* 26.4* 22.3*  HGB 9.8* 10.0* 9.7*  HCT 29.5* 31.1* 30.8*  NA 139 134*  --   K 4.0 3.7  --   CL 106 102  --   CO2 25 23  --   BUN 42* 43*  --   CREATININE 2.27* 2.31* 2.31*   Liver Panel  Recent Labs  07/21/16 0512  PROT 5.8*  ALBUMIN 2.8*  AST 93*  ALT 232*  ALKPHOS 67  BILITOT 1.3*   Sedimentation Rate No results for input(s): ESRSEDRATE in the last 72 hours. C-Reactive Protein  Recent Labs  07/20/16 2013  CRP 18.2*    Microbiology: Recent Results (from the past 240 hour(s))  Blood culture (routine x 2)     Status: None (Preliminary result)   Collection Time: 07/19/16 11:56 AM  Result Value Ref Range Status   Specimen Description BLOOD LEFT ANTECUBITAL  Final   Special Requests IN PEDIATRIC BOTTLE 3.5CC  Final   Culture   Final    NO GROWTH 2 DAYS Performed at [facility], [place] [address], [place], [place] [address]    Report Status PENDING  Incomplete  Urine culture     Status: None   Collection Time: 07/19/16 11:56 AM  Result Value Ref Range Status   Specimen Description URINE, RANDOM  Final   Special Requests NONE  Final   Culture   Final    NO GROWTH Performed at [facility], [address], [place], [place] [address]    Report Status 07/20/2016 FINAL  Final  Blood culture (routine x 2)     Status: None (Preliminary result)   Collection Time: 07/19/16 12:03 PM  Result Value Ref Range Status   Specimen Description BLOOD LEFT HAND  Final   Special Requests IN PEDIATRIC BOTTLE 3CC  Final    Culture   Final    NO GROWTH 2 DAYS Performed at [facility], [place] [address], [place], [place] [address]    Report Status PENDING  Incomplete  C difficile quick scan w PCR reflex     Status: Abnormal   Collection Time: 07/21/16  6:46 AM  Result Value Ref Range Status   C Diff antigen POSITIVE (A) NEGATIVE Final   C Diff toxin NEGATIVE NEGATIVE Final   C Diff interpretation Results are indeterminate. See PCR results.  Final  Clostridium Difficile by PCR     Status: Abnormal   Collection Time: 07/21/16  6:46 AM  Result Value Ref Range Status   Toxigenic C Difficile by pcr POSITIVE (A) NEGATIVE Final    Comment: Positive for toxigenic C. difficile with little to no toxin production. Only treat if clinical presentation suggests symptomatic illness. Performed at [facility], [place] [address] [place], [place] [address]     Studies/Results: US Scrotum  Result Date: 07/20/2016 CLINICAL DATA:  One week history of scrotal pain EXAM: SCROTAL ULTRASOUND DOPPLER ULTRASOUND OF THE TESTICLES TECHNIQUE: Complete ultrasound examination of the testicles, epididymis, and other scrotal structures was performed. Color and spectral Doppler ultrasound were also utilized to evaluate blood flow to the testicles. COMPARISON:  None. FINDINGS: Right testicle Measurements: 5.2 x 4.0 x 3.7 cm. The testicular parenchyma is diffusely inhomogeneous with areas of relative hyperechoic and hypoechoic parenchyma. No well-defined mass seen. No hyperemia seen on the right. Left testicle Measurements: 4.9 x 3.8 x 3.8 cm. Testicular parenchyma is diffusely inhomogeneous with areas of relative hyperechoic and hypoechoic testicular parenchyma. Appearance is similar to the contralateral side. No well-defined mass or hyperemia seen on the left. Right epididymis:  Normal in size and appearance. Left epididymis:  Normal in size and appearance. Hydrocele:  Moderate hydroceles bilaterally. Varicocele: Small varicocele on the  left. No varicocele on the right. Pulsed Doppler interrogation of both testes demonstrates normal low resistance arterial and venous waveforms bilaterally. No scrotal abscess or scrotal wall thickening. IMPRESSION: Diffusely inhomogeneous testicular parenchyma bilaterally without well-defined mass on either side. No hyperemia. This appearance raises concern for infiltrative type lesions within each testis. 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 More infiltrative type lesions such is extraosseous multiple myeloma or lymphoma can involve both testes simultaneously and must be of concern. The lack of hyperemia makes bilateral orchitis seen less likely, although the inhomogeneous appearance of the testicular parenchyma could be due to infectious etiology. Moderate hydroceles bilaterally. No extratesticular mass.  No torsion on either side. These results will be called to the ordering  clinician or representative by the Radiologist Assistant, and communication documented in the PACS or zVision Dashboard. Electronically Signed   By: Lowella Grip III M.D.   On: 07/20/2016 16:32   US Renal  Result Date: 07/20/2016 CLINICAL DATA:  Elevated creatinine and BUN, history of kidney stones, hypertension EXAM: RENAL / URINARY TRACT ULTRASOUND COMPLETE COMPARISON:  CT abdomen pelvis of 11/12/2015 FINDINGS: Right Kidney: Length: 11.4 cm. No hydronephrosis is seen. No renal calculi are evident. Left Kidney: Length: 11.2 cm. No hydronephrosis is seen. There is a nonobstructing calculus in the mid lower kidney of 7 mm in diameter. Also, there is a rounded structure which is hypoechogenic with some internal echoes. No blood flow is demonstrated and there is some through transmission. This probably represents a complex cyst, but further assessment with CT or preferably MRI would be recommended. Bladder: The urinary bladder is unremarkable other than slight prominence of the prostate indenting the posterior urinary  bladder. IMPRESSION: 1. Nonobstructing left renal calculus of 7 mm in diameter. 2. Probable complex cyst in the mid left kidney but further assessment with CT or preferably MRI would be recommended. 3. Prominent prostate. Electronically Signed   By: Ivar Drape M.D.   On: 07/20/2016 16:38   [place] Art/ven Flow Abd Pelv Doppler  Result Date: 07/20/2016 CLINICAL DATA:  One week history of scrotal pain EXAM: SCROTAL ULTRASOUND DOPPLER ULTRASOUND OF THE TESTICLES TECHNIQUE: Complete ultrasound examination of the testicles, epididymis, and other scrotal structures was performed. Color and spectral Doppler ultrasound were also utilized to evaluate blood flow to the testicles. COMPARISON:  None. FINDINGS: Right testicle Measurements: 5.2 x 4.0 x 3.7 cm. The testicular parenchyma is diffusely inhomogeneous with areas of relative hyperechoic and hypoechoic parenchyma. No well-defined mass seen. No hyperemia seen on the right. Left testicle Measurements: 4.9 x 3.8 x 3.8 cm. Testicular parenchyma is diffusely inhomogeneous with areas of relative hyperechoic and hypoechoic testicular parenchyma. Appearance is similar to the contralateral side. No well-defined mass or hyperemia seen on the left. Right epididymis:  Normal in size and appearance. Left epididymis:  Normal in size and appearance. Hydrocele:  Moderate hydroceles bilaterally. Varicocele: Small varicocele on the left. No varicocele on the right. Pulsed Doppler interrogation of both testes demonstrates normal low resistance arterial and venous waveforms bilaterally. No scrotal abscess or scrotal wall thickening. IMPRESSION: Diffusely inhomogeneous testicular parenchyma bilaterally without well-defined mass on either side. No hyperemia. This appearance raises concern for infiltrative type lesions within each testis. 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 More infiltrative type lesions such is extraosseous multiple myeloma or lymphoma can involve both testes  simultaneously and must be of concern. The lack of hyperemia makes bilateral orchitis seen less likely, although the inhomogeneous appearance of the testicular parenchyma could be due to infectious etiology. Moderate hydroceles bilaterally. No extratesticular mass.  No torsion on either side. These results will be called to the ordering clinician or representative by the Radiologist Assistant, and communication documented in the PACS or zVision Dashboard. Electronically Signed   By: Lowella Grip III M.D.   On: 07/20/2016 16:32     Assessment/Plan: Pneumonia, HCAP WBC coming down Afebrile Could consider change to augmentin to complete his anbx at home? Aim for 8 days of zosyn (or complete total of 8 days with augmentin if d/c) Off vanco  C diff +/- Would not treat, his test pattern suggests colonization Per pt, no BM today is afebrile.  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   ARF Off vanco 48h Cr stable, hopefully at peak.   Total days of antibiotics: 38 3 this adm (zosyn)      Available as needed.     Bobby Rumpf Infectious Diseases (pager) [phone] [email or web address] 07/22/2016, 10:30 AM  LOS: 3 days

## 2016-07-22 NOTE — Progress Notes (Signed)
Assessment:  1. Testes abnormality probably inflammation, and not malignancy ( Myeloma or lymphoma). Testes pain is improved.   Plan: Elevate testes on crushed towel.    Subjective: Patient reports increasing scrotal fluid. No complaints of testis pain overnight. U/S reviewed with Radiology x 2. PET scan reviewed from [facility], and case discussed with Dr. Alvy Bimler. WBC 22k today; ( slight improvement); Cr: 2.31: no change).   Objective: Vital signs in last 24 hours: Temp:  [97.4 F (36.3 C)-97.6 F (36.4 C)] 97.4 F (36.3 C) (03/15 0608) Pulse Rate:  [51-88] 88 (03/14 1938) Resp:  [18-21] 18 (03/15 0608) BP: (120-124)/(79-88) 120/88 (03/15 0608) SpO2:  [94 %-95 %] 95 % (03/15 0608) Weight:  [84.5 kg (186 lb 4.6 oz)] 84.5 kg (186 lb 4.6 oz) (03/15 0608)A  Exam: Scrotum: increased scrotal hydrocele fluid bilaterally. Testes less tender. No elevation of scrotum.   Intake/Output from previous day: 03/14 0701 - 03/15 0700 In: 220 [P.O.:120; IV Piggyback:100] Out: 700 [Urine:700] Intake/Output this shift: Total I/O In: -  Out: 200 [Urine:200]  Past Medical History:  Diagnosis Date  . Acute CHF (congestive heart failure) ([place]) 11/01/2015  . Acute on chronic diastolic heart failure ([place]) 09/27/2015  . Acute pulmonary embolus ([place]) 2010  . Acute respiratory failure with hypoxia ([place]) 09/09/2015  . Arthritis   . Atrial fibrillation [facility])    s/p inpatient DCCV 09/11/2015  . Atrial flutter ([place])   . CHF (congestive heart failure) ([place])   . Coronary artery disease involving native coronary artery 05/15/2015   multivessel  . Dysrhythmia    A-fib  . Essential hypertension   . Fibromyalgia   . History of kidney stones   . Hypercholesterolemia   . Hypertension   . Lupus anticoagulant positive 07/29/2008  . Mitral regurgitation 10/30/2015  . PMR (polymyalgia rheumatica) (HCC)   . Pneumonia   . 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 S/P Maze operation for atrial fibrillation 05/29/2015   Complete bilateral atrial lesion set via median sternotomy using bipolar radiofrequency and cryothermy ablation with clipping of LA appendage  . 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  Abdomen - Soft, non-tender & non-distended.  Lab Results:  Recent Labs  07/20/16 0353 07/21/16 0512 07/22/16 0429  WBC 27.4* 26.4* 22.3*  HGB 9.8* 10.0* 9.7*  HCT 29.5* 31.1* 30.8*   BMET  Recent Labs  07/20/16 0353 07/21/16 0512 07/22/16 0429  NA 139 134*  --   K 4.0 3.7  --   CL 106 102  --   CO2 25 23  --   GLUCOSE 108* 104*  --   BUN 42* 43*  --   CREATININE 2.27* 2.31* 2.31*  CALCIUM 8.4* 8.2*  --    No results for input(s): LABURIN in the last 72 hours. Results for orders placed or performed during the hospital encounter of 07/19/16  Blood culture (routine x 2)     Status: None (Preliminary result)   Collection Time: 07/19/16 11:56 AM  Result Value Ref Range Status   Specimen Description BLOOD LEFT ANTECUBITAL  Final   Special Requests IN PEDIATRIC BOTTLE  3.5CC  Final   Culture   Final    NO GROWTH 2 DAYS Performed at [facility], [place] [address], [place], [place] [address]    Report Status PENDING  Incomplete  Urine culture     Status: None   Collection Time: 07/19/16 11:56 AM  Result Value Ref Range Status   Specimen Description URINE, RANDOM  Final   Special Requests NONE  Final   Culture   Final    NO GROWTH Performed at [facility], [address], [place], [place] [address]    Report Status 07/20/2016 FINAL  Final  Blood culture (routine x 2)     Status: None (Preliminary result)    Collection Time: 07/19/16 12:03 PM  Result Value Ref Range Status   Specimen Description BLOOD LEFT HAND  Final   Special Requests IN PEDIATRIC BOTTLE 3CC  Final   Culture   Final    NO GROWTH 2 DAYS Performed at [facility], [place] [address] [place],  [address]    Report Status PENDING  Incomplete  C difficile quick scan w PCR reflex     Status: Abnormal   Collection Time: 07/21/16  6:46 AM  Result Value Ref Range Status   C Diff antigen POSITIVE (A) NEGATIVE Final   C Diff toxin NEGATIVE NEGATIVE Final   C Diff interpretation Results are indeterminate. See PCR results.  Final  Clostridium Difficile by PCR     Status: Abnormal   Collection Time: 07/21/16  6:46 AM  Result Value Ref Range Status   Toxigenic C Difficile by pcr POSITIVE (A) NEGATIVE Final    Comment: Positive for toxigenic C. difficile with little to no toxin production. Only treat if clinical presentation suggests symptomatic illness. Performed at [facility], [place] [address], [place],  [address]     Studies/Results: No results found.    Kamry Faraci I Teala Daffron 07/22/2016, 9:39 AM

## 2016-07-22 NOTE — Progress Notes (Signed)
PROGRESS NOTE    Peter Bruce  QKM:[id number] DOB: 1951/04/26 DOA: 07/19/2016 PCP: Adrian Prows, MD    Brief Narrative:  66 yo male with recent strept viridans endocarditis and meningitis. Patient presented with nausea, vomiting, diarrhea and generalized weakness. On the initial examination he was noted to be dehydrated, decreased breath sounds at bases with bibasilar rales, more right than left. Chest film with right lower lobe infiltrate. Admitted for health care associated pneumonia.    Assessment & Plan:   Principal Problem:   HCAP (healthcare-associated pneumonia) Active Problems:   Paroxysmal atrial fibrillation (HCC)   Dyslipidemia   Respiratory failure with hypoxia (HCC)   Current use of long term anticoagulation   Chronic diastolic CHF (congestive heart failure) (HCC)   Essential hypertension   Cancer of base of tongue (HCC)   Streptococcal bacteremia   Prosthetic valve endocarditis (HCC)   Coronary artery disease involving coronary bypass graft of native heart without angina pectoris   Benign essential HTN   Leukocytosis   Anemia of chronic disease   AKI (acute kidney injury) ([place])   Dehydration   Lobar pneumonia ([place])   1. Health care associated pneumonia.  Antibiotic therapy with Zosyn #3. Oxymetry monitoring and supplemental 02 per [place], target 02 saturation more then 92%. Currently 94% on room air. As needed bronchodilator therapy. Wbc elevated at 22 from 26.   2. AKI. Creatinine stable at 2,3. Continue to hold on IV fluids, will follow on renal panel in am.  3. 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 On IV zosyn, per ID recommendations,   4. Paroxysmal atrial fibrillation. Transition to xarelto for anticoagulation, rate remained well controlled,   5. HTN. Antihypertensive agents on hold to prevent hypotension.   6. 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 No current therapy  7. Positive c diff antigen but negative toxin. No active infection, holding on c diff  therapy, patient with no diarrhea documented.   DVT prophylaxis: heparin  Code Status: full  Family Communication: No family at the bedside  Disposition Plan: home   Consultants:   I.D  Procedures:     Antimicrobials:  zosyn   Subjective: Patient with no dyspnea or chest pain, no nausea or vomiting.   Objective: Vitals:   07/21/16 0428 07/21/16 1418 07/21/16 1938 07/22/16 0608  BP: 133/80 124/79 121/86 120/88  Pulse: (!) 51 (!) 51 88   Resp: 20 20 (!) 21 18  Temp: 98.3 F (36.8 C) 97.4 F (36.3 C) 97.6 F (36.4 C) 97.4 F (36.3 C)  TempSrc: Oral Oral Oral Oral  SpO2: 91% 95% 94% 95%  Weight: 83.5 kg (184 lb 1.4 oz)   84.5 kg (186 lb 4.6 oz)  Height:        Intake/Output Summary (Last 24 hours) at 07/22/16 1222 Last data filed at 07/22/16 0921  Gross per 24 hour  Intake              170 ml  Output              900 ml  Net             -730 ml   Filed Weights   07/20/16 0456 07/21/16 0428 07/22/16 5790  Weight: 82.4 kg (181 lb 10.5 oz) 83.5 kg (184 lb 1.4 oz) 84.5 kg (186 lb 4.6 oz)    Examination:  General exam: Deconditioned, not in pain or dyspnea E ENT: mild pallor, no icterus, oral mucosa moist. Respiratory system: Clear to auscultation.  Respiratory effort normal. Rales at bases, no wheezing, no rhonchi.  Cardiovascular system: S1 & S2 heard, RRR. No JVD, murmurs, rubs, gallops or clicks. Bilateral trace pedal edema. Gastrointestinal system: Abdomen is nondistended, soft and nontender. No organomegaly or masses felt. Normal bowel sounds heard. Central nervous system: Alert and oriented. No focal neurological deficits. Extremities: Symmetric 5 x 5 power. Skin: No rashes, lesions or ulcers .     Data Reviewed: I have personally reviewed following labs and imaging studies  CBC:  Recent Labs Lab 07/19/16 1016 07/20/16 0353 07/21/16 0512 07/22/16 0429  WBC 27.8* 27.4* 26.4* 22.3*  NEUTROABS 24.4*  --   --   --   HGB 10.3* 9.8* 10.0* 9.7*   HCT 31.1* 29.5* 31.1* 30.8*  MCV 94.5 94.6 93.7 94.2  PLT 72* 71* 74* 77*   Basic Metabolic Panel:  Recent Labs Lab 07/19/16 1016 07/20/16 0353 07/21/16 0512 07/22/16 0429  NA 134* 139 134*  --   K 4.3 4.0 3.7  --   CL 101 106 102  --   CO2 _0 --   GLUCOSE 125* 108* 104*  --   BUN 43* 42* 43*  --   CREATININE 2.32* 2.27* 2.31* 2.31*  CALCIUM 8.6* 8.4* 8.2*  --    GFR: Estimated Creatinine Clearance: 32 mL/min (A) (by C-G formula based on SCr of 2.31 mg/dL (H)). Liver Function Tests:  Recent Labs Lab 07/19/16 1016 07/21/16 0512  AST 237* 93*  ALT 358* 232*  ALKPHOS 81 67  BILITOT 0.9 1.3*  PROT 6.1* 5.8*  ALBUMIN 3.3* 2.8*   No results for input(s): LIPASE, AMYLASE in the last 168 hours. No results for input(s): AMMONIA in the last 168 hours. Coagulation Profile:  Recent Labs Lab 07/19/16 1608  INR 1.55   Cardiac Enzymes: No results for input(s): CKTOTAL, CKMB, CKMBINDEX, TROPONINI in the last 168 hours. BNP (last 3 results) No results for input(s): PROBNP in the last 8760 hours. HbA1C: No results for input(s): HGBA1C in the last 72 hours. CBG: No results for input(s): GLUCAP in the last 168 hours. Lipid Profile: No results for input(s): CHOL, HDL, LDLCALC, TRIG, CHOLHDL, LDLDIRECT in the last 72 hours. Thyroid Function Tests: No results for input(s): TSH, T4TOTAL, FREET4, T3FREE, THYROIDAB in the last 72 hours. Anemia Panel: No results for input(s): VITAMINB12, FOLATE, FERRITIN, TIBC, IRON, RETICCTPCT in the last 72 hours. Sepsis Labs:  Recent Labs Lab 07/19/16 1026 07/20/16 0353 07/22/16 0429  PROCALCITON  --  0.90 0.58  LATICACIDVEN 2.09*  --   --     Recent Results (from the past 240 hour(s))  Blood culture (routine x 2)     Status: None (Preliminary result)   Collection Time: 07/19/16 11:56 AM  Result Value Ref Range Status   Specimen Description BLOOD LEFT ANTECUBITAL  Final   Special Requests IN PEDIATRIC BOTTLE 3.5CC  Final    Culture   Final    NO GROWTH 2 DAYS Performed at [facility], [place] [address] [place], [place] [address]    Report Status PENDING  Incomplete  Urine culture     Status: None   Collection Time: 07/19/16 11:56 AM  Result Value Ref Range Status   Specimen Description URINE, RANDOM  Final   Special Requests NONE  Final   Culture   Final    NO GROWTH Performed at [facility], [address] [place], [place] [address]    Report Status 07/20/2016 FINAL  Final  Blood culture (routine x 2)     Status: None (Preliminary result)   Collection Time: 07/19/16 12:03 PM  Result Value Ref Range Status   Specimen Description BLOOD LEFT HAND  Final   Special Requests IN PEDIATRIC BOTTLE 3CC  Final   Culture   Final    NO GROWTH 2 DAYS Performed at [facility], [place] [address] [place], [place] [address]    Report Status PENDING  Incomplete  C difficile quick scan w PCR reflex     Status: Abnormal   Collection Time: 07/21/16  6:46 AM  Result Value Ref Range Status   C Diff antigen POSITIVE (A) NEGATIVE Final   C Diff toxin NEGATIVE NEGATIVE Final   C Diff interpretation Results are indeterminate. See PCR results.  Final  Clostridium Difficile by PCR     Status: Abnormal   Collection Time: 07/21/16  6:46 AM  Result Value Ref Range Status   Toxigenic C Difficile by pcr POSITIVE (A) NEGATIVE Final    Comment: Positive for toxigenic C. difficile with little to no toxin production. Only treat if clinical presentation suggests symptomatic illness. Performed at [facility], [place] [address] [place],  [address]          Radiology Studies: US Scrotum  Result Date: 07/20/2016 CLINICAL DATA:  One week history of scrotal pain EXAM: SCROTAL ULTRASOUND DOPPLER ULTRASOUND OF THE TESTICLES TECHNIQUE: Complete ultrasound examination of the testicles, epididymis, and other scrotal structures was performed. Color and spectral Doppler ultrasound were also utilized to evaluate  blood flow to the testicles. COMPARISON:  None. FINDINGS: Right testicle Measurements: 5.2 x 4.0 x 3.7 cm. The testicular parenchyma is diffusely inhomogeneous with areas of relative hyperechoic and hypoechoic parenchyma. No well-defined mass seen. No hyperemia seen on the right. Left testicle Measurements: 4.9 x 3.8 x 3.8 cm. Testicular parenchyma is diffusely inhomogeneous with areas of relative hyperechoic and hypoechoic testicular parenchyma. Appearance is similar to the contralateral side. No well-defined mass or hyperemia seen on the left. Right epididymis:  Normal in size and appearance. Left epididymis:  Normal in size and appearance. Hydrocele:  Moderate hydroceles bilaterally. Varicocele: Small varicocele on the left. No varicocele on the right. Pulsed Doppler interrogation of both testes demonstrates normal low resistance arterial and venous waveforms bilaterally. No scrotal abscess or scrotal wall thickening. IMPRESSION: Diffusely inhomogeneous testicular parenchyma bilaterally without well-defined mass on either side. No hyperemia. This appearance raises concern for infiltrative type lesions within each testis. 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 More infiltrative type lesions such is extraosseous multiple myeloma or lymphoma can involve both testes simultaneously and must be of concern. The lack of hyperemia makes bilateral orchitis seen less likely, although the inhomogeneous appearance of the testicular parenchyma could be due to infectious etiology. Moderate hydroceles bilaterally. No extratesticular mass.  No torsion on either side. These results will be called to the ordering clinician or representative by the Radiologist Assistant, and communication documented in the PACS or zVision Dashboard. Electronically Signed   By: Lowella Grip III M.D.   On: 07/20/2016 16:32   US Renal  Result Date: 07/20/2016 CLINICAL DATA:  Elevated creatinine and BUN, history of kidney stones,  hypertension EXAM: RENAL / URINARY TRACT ULTRASOUND COMPLETE COMPARISON:  CT abdomen pelvis of 11/12/2015 FINDINGS: Right Kidney: Length: 11.4 cm. No hydronephrosis is seen. No renal calculi are evident. Left Kidney: Length: 11.2 cm. No hydronephrosis is seen. There is a nonobstructing calculus in the mid lower  kidney of 7 mm in diameter. Also, there is a rounded structure which is hypoechogenic with some internal echoes. No blood flow is demonstrated and there is some through transmission. This probably represents a complex cyst, but further assessment with CT or preferably MRI would be recommended. Bladder: The urinary bladder is unremarkable other than slight prominence of the prostate indenting the posterior urinary bladder. IMPRESSION: 1. Nonobstructing left renal calculus of 7 mm in diameter. 2. Probable complex cyst in the mid left kidney but further assessment with CT or preferably MRI would be recommended. 3. Prominent prostate. Electronically Signed   By: Ivar Drape M.D.   On: 07/20/2016 16:38   [place] Art/ven Flow Abd Pelv Doppler  Result Date: 07/20/2016 CLINICAL DATA:  One week history of scrotal pain EXAM: SCROTAL ULTRASOUND DOPPLER ULTRASOUND OF THE TESTICLES TECHNIQUE: Complete ultrasound examination of the testicles, epididymis, and other scrotal structures was performed. Color and spectral Doppler ultrasound were also utilized to evaluate blood flow to the testicles. COMPARISON:  None. FINDINGS: Right testicle Measurements: 5.2 x 4.0 x 3.7 cm. The testicular parenchyma is diffusely inhomogeneous with areas of relative hyperechoic and hypoechoic parenchyma. No well-defined mass seen. No hyperemia seen on the right. Left testicle Measurements: 4.9 x 3.8 x 3.8 cm. Testicular parenchyma is diffusely inhomogeneous with areas of relative hyperechoic and hypoechoic testicular parenchyma. Appearance is similar to the contralateral side. No well-defined mass or hyperemia seen on the left. Right epididymis:   Normal in size and appearance. Left epididymis:  Normal in size and appearance. Hydrocele:  Moderate hydroceles bilaterally. Varicocele: Small varicocele on the left. No varicocele on the right. Pulsed Doppler interrogation of both testes demonstrates normal low resistance arterial and venous waveforms bilaterally. No scrotal abscess or scrotal wall thickening. IMPRESSION: Diffusely inhomogeneous testicular parenchyma bilaterally without well-defined mass on either side. No hyperemia. This appearance raises concern for infiltrative type lesions within each testis. 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 More infiltrative type lesions such is extraosseous multiple myeloma or lymphoma can involve both testes simultaneously and must be of concern. The lack of hyperemia makes bilateral orchitis seen less likely, although the inhomogeneous appearance of the testicular parenchyma could be due to infectious etiology. Moderate hydroceles bilaterally. No extratesticular mass.  No torsion on either side. These results will be called to the ordering clinician or representative by the Radiologist Assistant, and communication documented in the PACS or zVision Dashboard. Electronically Signed   By: Lowella Grip III M.D.   On: 07/20/2016 16:32        Scheduled Meds: . multivitamin with minerals  1 tablet Oral Daily  . piperacillin-tazobactam (ZOSYN)  IV  3.375 g Intravenous Q8H  . rivaroxaban  15 mg Oral Daily  . saccharomyces boulardii  250 mg Oral BID  . sodium chloride flush  3 mL Intravenous Q12H   Continuous Infusions:   LOS: 3 days       Fenris Cauble Gerome Apley, MD [facility] Pager [phone]  If 7PM-7AM, please contact night-coverage [email or web address] Password [facility] 07/22/2016, 12:22 PM

## 2016-07-23 DIAGNOSIS — R531 Weakness: Secondary | ICD-10-CM

## 2016-07-23 DIAGNOSIS — I5032 Chronic diastolic (congestive) heart failure: Secondary | ICD-10-CM

## 2016-07-23 DIAGNOSIS — D638 Anemia in other chronic diseases classified elsewhere: Secondary | ICD-10-CM

## 2016-07-23 LAB — CBC WITH DIFFERENTIAL/PLATELET
BASOS ABS: 0.1 10*3/uL (ref 0.0–0.1)
BASOS PCT: 0 %
EOS PCT: 4 %
Eosinophils Absolute: 0.7 10*3/uL (ref 0.0–0.7)
HCT: 29.6 % — ABNORMAL LOW (ref 39.0–52.0)
Hemoglobin: 9.5 g/dL — ABNORMAL LOW (ref 13.0–17.0)
Lymphocytes Relative: 5 %
Lymphs Abs: 1 10*3/uL (ref 0.7–4.0)
MCH: 30.2 pg (ref 26.0–34.0)
MCHC: 32.1 g/dL (ref 30.0–36.0)
MCV: 94 fL (ref 78.0–100.0)
Monocytes Absolute: 1.8 10*3/uL — ABNORMAL HIGH (ref 0.1–1.0)
Monocytes Relative: 10 %
NEUTROS ABS: 14.6 10*3/uL — AB (ref 1.7–7.7)
Neutrophils Relative %: 81 %
PLATELETS: 68 10*3/uL — AB (ref 150–400)
RBC: 3.15 MIL/uL — ABNORMAL LOW (ref 4.22–5.81)
RDW: 18.1 % — AB (ref 11.5–15.5)
WBC: 18.1 10*3/uL — ABNORMAL HIGH (ref 4.0–10.5)

## 2016-07-23 LAB — BASIC METABOLIC PANEL
ANION GAP: 5 (ref 5–15)
Anion gap: 7 (ref 5–15)
BUN: 41 mg/dL — AB (ref 6–20)
BUN: 40 mg/dL — AB (ref 6–20)
CALCIUM: 8.1 mg/dL — AB (ref 8.9–10.3)
CALCIUM: 8 mg/dL — AB (ref 8.9–10.3)
CO2: 25 mmol/L (ref 22–32)
CO2: 26 mmol/L (ref 22–32)
Chloride: 102 mmol/L (ref 101–111)
Chloride: 102 mmol/L (ref 101–111)
Creatinine, Ser: 2.17 mg/dL — ABNORMAL HIGH (ref 0.61–1.24)
Creatinine, Ser: 1.97 mg/dL — ABNORMAL HIGH (ref 0.61–1.24)
GFR calc Af Amer: 34 mL/min — ABNORMAL LOW (ref >60)
GFR calc Af Amer: 39 mL/min — ABNORMAL LOW (ref >60)
GFR, EST NON AFRICAN AMERICAN: 30 mL/min — AB (ref >60)
GFR, EST NON AFRICAN AMERICAN: 33 mL/min — AB (ref >60)
GLUCOSE: 113 mg/dL — AB (ref 65–99)
GLUCOSE: 131 mg/dL — AB (ref 65–99)
Potassium: 3.6 mmol/L (ref 3.5–5.1)
Potassium: 3.5 mmol/L (ref 3.5–5.1)
Sodium: 134 mmol/L — ABNORMAL LOW (ref 135–145)
Sodium: 133 mmol/L — ABNORMAL LOW (ref 135–145)

## 2016-07-23 LAB — TROPONIN I
Troponin I: 0.13 ng/mL (ref <0.03)
Troponin I: 0.11 ng/mL (ref <0.03)
Troponin I: 0.1 ng/mL (ref <0.03)

## 2016-07-23 LAB — MAGNESIUM: MAGNESIUM: 2.2 mg/dL (ref 1.7–2.4)

## 2016-07-23 MED ORDER — AMOXICILLIN-POT CLAVULANATE 875-125 MG PO TABS: 1.0000 | ORAL_TABLET | Freq: Two times a day (BID) | ORAL | 0 refills | Status: DC

## 2016-07-23 MED ORDER — ACETAMINOPHEN 325 MG PO TABS: 650.0000 mg | ORAL_TABLET | Freq: Four times a day (QID) | ORAL | 0 refills | Status: AC | PRN

## 2016-07-23 MED ORDER — ASPIRIN 81 MG PO CHEW
81.0000 mg | CHEWABLE_TABLET | Freq: Every day | ORAL | Status: DC
  Administered 2016-07-23: 81 mg via ORAL
  Filled 2016-07-23 (×2): qty 1

## 2016-07-23 NOTE — Progress Notes (Signed)
Occupational Therapy Treatment Patient Details Name: Peter Bruce MRN: [id number] DOB: Feb 17, 1951 Today's Date: 07/23/2016    History of present illness 66 yo male admitted with Pna, confusion. hx of CVA, endocarditis, CAD, PE, A fib, DVt, lupus, CABG, CHF, arthritis, tongue cancer.    OT comments  Pt progressing towards acute OT goals. Focus of session was functional transfers/mobility and energy conservation education. D/c plan remains appropriate though pt currently declining HH therapy.    Follow Up Recommendations  Home health OT;Supervision/Assistance - 24 hour    Equipment Recommendations  3 in 1 bedside commode    Recommendations for Other Services      Precautions / Restrictions Precautions Precautions: Fall Restrictions Weight Bearing Restrictions: No       Mobility Bed Mobility Overal bed mobility: Modified Independent Bed Mobility: Sidelying to Sit;Sit to Supine           General bed mobility comments: increased time. HOB elevated. Pt used bedrail.   Transfers Overall transfer level: Needs assistance Equipment used: Rolling walker (2 wheeled) Transfers: Sit to/from Stand Sit to Stand: Min assist         General transfer comment: Assist to rise, stabilize. Increased time. VCs hand placement. From EOB.    Balance Overall balance assessment: Needs assistance           Standing balance-Leahy Scale: Poor Standing balance comment: requiring RW currently                   ADL Overall ADL's : Needs assistance/impaired                         Toilet Transfer: Minimal assistance;Ambulation;RW Toilet Transfer Details (indicate cue type and reason): simulated         Functional mobility during ADLs: Minimal assistance;Rolling walker General ADL Comments: Pt completed bed mobility and household distance functional mobility. Educated on energy conservation techniques and given educational handout. Pt needing cues at times to correct  path when walking with tendency to veer to right.       Vision                     Perception     Praxis      Cognition   Behavior During Therapy: [name] for tasks assessed/performed Overall Cognitive Status: Impaired/Different from baseline Area of Impairment: Safety/judgement          Safety/Judgement: Decreased awareness of deficits;Decreased awareness of safety            Exercises     Shoulder Instructions       General Comments      Pertinent Vitals/ Pain       Pain Assessment: 0-10 Faces Pain Scale: Hurts whole lot Pain Location: scrotum, back Pain Descriptors / Indicators: Grimacing;Sore;Tender Pain Intervention(s): Limited activity within patient's tolerance  Home Living                                          Prior Functioning/Environment              Frequency  Min 2X/week        Progress Toward Goals  OT Goals(current goals can now be found in the care plan section)  Progress towards OT goals: Progressing toward goals  Acute Rehab OT Goals Patient Stated Goal: less pain OT Goal Formulation:  With patient Time For Goal Achievement: 08/03/16 Potential to Achieve Goals: Good ADL Goals Pt Will Perform Grooming: with supervision;standing Pt Will Perform Lower Body Dressing: with supervision;sit to/from stand Pt Will Transfer to Toilet: with supervision;ambulating;regular height toilet Pt Will Perform Toileting - Clothing Manipulation and hygiene: with supervision;sit to/from stand Pt Will Perform Tub/Shower Transfer: with supervision  Plan Discharge plan remains appropriate    Co-evaluation    PT/OT/SLP Co-Evaluation/Treatment: Yes Reason for Co-Treatment: To address functional/ADL transfers          End of Session Equipment Utilized During Treatment: Gait belt;Rolling walker  OT Visit Diagnosis: Unsteadiness on feet (R26.81);Muscle weakness (generalized) (M62.81)   Activity Tolerance Patient  limited by fatigue   Patient Left in bed;with call bell/phone within reach;with bed alarm set   Nurse Communication          Time: 1610-9604 OT Time Calculation (min): 16 min  Charges: OT General Charges $OT Visit: 1 Procedure OT Treatments $Self Care/Home Management : 8-22 mins     Peter Bruce 07/23/2016, 12:45 PM

## 2016-07-23 NOTE — Discharge Summary (Addendum)
Physician Discharge Summary  Peter Bruce CLE:[id number] DOB: 07-24-1950 DOA: 07/19/2016  PCP: Adrian Prows, MD  Admit date: 07/19/2016 Discharge date: 07/23/2016  Admitted From: Home  Disposition:  Home  Recommendations for Outpatient Follow-up:  1. Follow up with PCP in 1- week 2. Patient placed on augmentin for 5 days to complete 8 days therapy.   Home Health: Yes Equipment/Devices: No   Discharge Condition: Stable CODE STATUS: Full  Diet recommendation: Heart Healthy   Brief/Interim Summary: This is a 66 year old gentleman who presented to hospital with a chief complaint of nausea, vomiting, diarrhea and generalized weakness. He has been on therapy for endocarditis. He was diuresis as an outpatient with no much improvement of his symptoms. Physical examination his blood pressure 135/89, heart rate 94, respiratory rate 20, temperature 98.2., Oxygen saturation 98%. His mucous membranes were dry, his lungs had decreased breath sounds at bases with bibasilar rales more right than left, heart S1-S2 present irregularly irregular, was soft nontender, lower extremity with no significant edema. Sodium 134, potassium 4.3, chloride 101, bicarbonate 25, glucose 125, BUN 43, creatinine 2.32, white count 27.8, hemoglobin 10.3, hematocrit 31.1, platelets 72. Urinalysis negative for infection. Chest x-ray with right lower lobe opacity.  The patient was admitted to the hospital working diagnosis of healthcare associated pneumonia, right lower lobe.  1. Healthcare associated pneumonia with acute hypoxic respiratory failure. Patient was admitted to the medical floor, he was placed on broad-spectrum antibiotic therapy with IV vancomycin and Zosyn. Blood cultures remain no growth. His symptoms improved as well as his oxygenation. Oximetry by time of discharge is 97% room air. Discharge white cell count 18.1 patient will continue on antibiotic therapy without 0.4 Days.  2. 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 Infection disease was consulted, patient completed the course of IV antibiotics in the hospital. PICC line will remove before he gets discharged.  3. Acute kidney injury on chronic kidney disease and chronic kidney disease stage II.Marland Kitchen Patient received IV fluids, his kidney function improved, discharge creatinine is 1.97. Baseline Creatinine around 1.1-1.2. Recommend close follow-up as an outpatient on kidney function and electrolytes.   4. Paroxysmal atrial fibrillation. Rate remained well-controlled, patient anticoagulation was continued with rivaroxaban.  5. Hypertension. Antihypertensive agents were held during this hospitalization, discharge blood pressure [phone] systolic. Valsartan will be resumed as an outpatient, close monitoring of blood pressure is recommended.  6. 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 Will need to follow-up as an outpatient.  7. Positive C. difficile antigen but negative toxin. No active infection, this is presumed to be a contamination, no therapy indicated.  8. Scrotal edema. Presumed to be related to hypoproteinemia, urology was consulted with recommendations to keep scrotum elevated, follow-up as an outpatient.     Discharge Diagnoses:  Principal Problem:   HCAP (healthcare-associated pneumonia) Active Problems:   Paroxysmal atrial fibrillation (HCC)   Dyslipidemia   Respiratory failure with hypoxia (HCC)   Current use of long term anticoagulation   Chronic diastolic CHF (congestive heart failure) (HCC)   Essential hypertension   Cancer of base of tongue (HCC)   Streptococcal bacteremia   Prosthetic valve endocarditis (HCC)   Coronary artery disease involving coronary bypass graft of native heart without angina pectoris   Benign essential HTN   Leukocytosis   Anemia of chronic disease   AKI (acute kidney injury) ([place])   Dehydration   Lobar pneumonia ([place])    Discharge Instructions   Allergies as of 07/23/2016      Reactions  Amitriptyline  Shortness Of Breath   Serotonin Reuptake Inhibitors (ssris) Nausea And Vomiting      Medication List    STOP taking these medications   aspirin 325 MG tablet   atorvastatin 40 MG tablet Commonly known as:  LIPITOR   cefTRIAXone IVPB Commonly known as:  ROCEPHIN   metoprolol succinate 25 MG 24 hr tablet Commonly known as:  TOPROL-XL   predniSONE 10 MG tablet Commonly known as:  DELTASONE   QUEtiapine 25 MG tablet Commonly known as:  SEROQUEL     TAKE these medications   acetaminophen 325 MG tablet Commonly known as:  TYLENOL Take 2 tablets (650 mg total) by mouth every 6 (six) hours as needed for mild pain (or Fever >/= 101). What changed:  medication strength  how much to take  reasons to take this   amoxicillin-clavulanate 875-125 MG tablet Commonly known as:  AUGMENTIN Take 1 tablet by mouth 2 (two) times daily.   diazepam 10 MG tablet Commonly known as:  VALIUM Take 10 mg by mouth at bedtime as needed for anxiety or sleep.   multivitamin with minerals Tabs tablet Take 1 tablet by mouth daily.   ondansetron 8 MG disintegrating tablet Commonly known as:  ZOFRAN ODT Take 1 tablet (8 mg total) by mouth every 8 (eight) hours as needed for nausea or vomiting.   oxyCODONE-acetaminophen 7.5-325 MG tablet Commonly known as:  PERCOCET Take 1 tablet by mouth 3 (three) times daily as needed for severe pain.   rivaroxaban 20 MG Tabs tablet Commonly known as:  XARELTO Take 1 tablet (20 mg total) by mouth daily with supper.   tiZANidine 4 MG tablet Commonly known as:  ZANAFLEX Take 2-4 mg by mouth at bedtime as needed for muscle spasms.   valsartan 160 MG tablet Commonly known as:  DIOVAN Take 1 tablet (160 mg total) by mouth daily.   zolpidem 10 MG tablet Commonly known as:  AMBIEN Take 10 mg by mouth at bedtime as needed for sleep.      Follow-up Information    [facility]. Schedule an appointment as soon as possible for a visit.    Why:  March 27th  Contact information: [address] [place] [address] [phone]          Allergies  Allergen Reactions  . Amitriptyline Shortness Of Breath  . Serotonin Reuptake Inhibitors (Ssris) Nausea And Vomiting    Consultations:  I.D  Urology    Procedures/Studies: Dg Chest 2 View  Result Date: 07/19/2016 CLINICAL DATA:  Weakness and fatigue EXAM: CHEST  2 VIEW COMPARISON:  June 11, 2016 FINDINGS: There is airspace consolidation in the right lower lobe with right pleural effusion. The left lung is clear. Heart is mildly enlarged with pulmonary venous hypertension. No adenopathy. Central catheter tip is in superior vena cava. Patient is status post coronary artery bypass grafting. There is a left atrial appendage clamp. There is degenerative change in the lower thoracic region. There is a small benign appearing cystic area in the right proximal humerus. IMPRESSION: Right base consolidation consistent with pneumonia with right pleural effusion. Left lung clear. There is pulmonary vascular congestion. No interstitial edema. Central catheter tip in superior vena cava. No evident pneumothorax. Electronically Signed   By: Lowella Grip III M.D.   On: 07/19/2016 10:23   US Scrotum  Result Date: 07/20/2016 CLINICAL DATA:  One week history of scrotal pain EXAM: SCROTAL ULTRASOUND DOPPLER ULTRASOUND OF THE TESTICLES TECHNIQUE: Complete ultrasound examination  of the testicles, epididymis, and other scrotal structures was performed. Color and spectral Doppler ultrasound were also utilized to evaluate blood flow to the testicles. COMPARISON:  None. FINDINGS: Right testicle Measurements: 5.2 x 4.0 x 3.7 cm. The testicular parenchyma is diffusely inhomogeneous with areas of relative hyperechoic and hypoechoic parenchyma. No well-defined mass seen. No hyperemia seen on the right. Left testicle Measurements: 4.9 x 3.8 x 3.8 cm. Testicular parenchyma is diffusely inhomogeneous with  areas of relative hyperechoic and hypoechoic testicular parenchyma. Appearance is similar to the contralateral side. No well-defined mass or hyperemia seen on the left. Right epididymis:  Normal in size and appearance. Left epididymis:  Normal in size and appearance. Hydrocele:  Moderate hydroceles bilaterally. Varicocele: Small varicocele on the left. No varicocele on the right. Pulsed Doppler interrogation of both testes demonstrates normal low resistance arterial and venous waveforms bilaterally. No scrotal abscess or scrotal wall thickening. IMPRESSION: Diffusely inhomogeneous testicular parenchyma bilaterally without well-defined mass on either side. No hyperemia. This appearance raises concern for infiltrative type lesions within each testis. 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 More infiltrative type lesions such is extraosseous multiple myeloma or lymphoma can involve both testes simultaneously and must be of concern. The lack of hyperemia makes bilateral orchitis seen less likely, although the inhomogeneous appearance of the testicular parenchyma could be due to infectious etiology. Moderate hydroceles bilaterally. No extratesticular mass.  No torsion on either side. These results will be called to the ordering clinician or representative by the Radiologist Assistant, and communication documented in the PACS or zVision Dashboard. Electronically Signed   By: Lowella Grip III M.D.   On: 07/20/2016 16:32   US Renal  Result Date: 07/20/2016 CLINICAL DATA:  Elevated creatinine and BUN, history of kidney stones, hypertension EXAM: RENAL / URINARY TRACT ULTRASOUND COMPLETE COMPARISON:  CT abdomen pelvis of 11/12/2015 FINDINGS: Right Kidney: Length: 11.4 cm. No hydronephrosis is seen. No renal calculi are evident. Left Kidney: Length: 11.2 cm. No hydronephrosis is seen. There is a nonobstructing calculus in the mid lower kidney of 7 mm in diameter. Also, there is a rounded structure which is  hypoechogenic with some internal echoes. No blood flow is demonstrated and there is some through transmission. This probably represents a complex cyst, but further assessment with CT or preferably MRI would be recommended. Bladder: The urinary bladder is unremarkable other than slight prominence of the prostate indenting the posterior urinary bladder. IMPRESSION: 1. Nonobstructing left renal calculus of 7 mm in diameter. 2. Probable complex cyst in the mid left kidney but further assessment with CT or preferably MRI would be recommended. 3. Prominent prostate. Electronically Signed   By: Ivar Drape M.D.   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 EXAM: NUCLEAR MEDICINE PET SKULL BASE TO THIGH TECHNIQUE: 13.1 mCi F-18 FDG was injected intravenously. Full-ring PET imaging was performed from the skull base to thigh after the radiotracer. CT data was obtained and used for attenuation correction and anatomic localization. FASTING BLOOD GLUCOSE:  Value: 70 mg/dl COMPARISON:  None. FINDINGS: NECK Along the right posterior tongue base and extending into the pharyngoepiglottic fold and epiglottis on the right side, and also into the glossoepiglottic fold and crossing the midline, there is a ill-defined mass lesion with maximum standard uptake value 12.4. This extends along the upper margin of the hyoid bone but is not obviously destroying  the hyoid bone. Along the anterior margin the right sternocleidomastoid muscle there is an ill-defined focus of hypermetabolic activity corresponding to a station IIa node of approximately 1.1 cm in short axis on image 31/3, maximum SUV 12.4. A larger low-density node above this in station IIa measures 2.1 cm in short axis on image 25/3 but has only low-grade metabolic activity with maximum SUV of about 5.0. A left-sided station IIa lymph node  measuring 1.2 cm in short axis on image 36/3 has a maximum standard uptake value of 6.2. This is right about at the level of the hyoid. A left level III lymph node measuring 1.4 cm on image 44/3 has a maximum SUV of 5.9. CHEST Faintly accentuated metabolic activity along coronary stent, thought to be incidental. No hypermetabolic adenopathy in the chest. Mild cardiomegaly noted. Prosthetic mitral valve. Prior CABG. Coronary, aortic arch, and branch vessel atherosclerotic vascular disease. Moderate right and small left pleural effusion with associated passive atelectasis. There are scattered mildly prominent lymph nodes observed in the mediastinum with regard to size, including a 1.5 cm right lower paratracheal node on image 82 of series 3. These have relatively low standard uptake value, this paratracheal node has a maximum SUV of only 2.3. Background mediastinal blood pool activity has an SUV of 2.6. There appears to be a small loculated component of the right pleural effusion extending into the space between the right pectoralis major and minor in the third intercostal space. Numbering of the intercostal spaces is tricky as the first and second ribs are fused along parts of their length bilaterally. I suspect this appearance could be due to a prior thoracotomy. ABDOMEN/PELVIS No abnormal hypermetabolic activity within the liver, pancreas, adrenal glands, or spleen. No hypermetabolic lymph nodes in the abdomen or pelvis. Cholelithiasis. Left mid kidney 7 mm nonobstructive renal calculus. There several punctate 2-3 mm right renal calculi. No hydronephrosis or hydroureter. Descending and sigmoid colon diverticulosis. Enlarged prostate gland indents the bladder base. SKELETON Small focus of hypermetabolic activity along the posterior margin of the L5 vertebra just to left of midline, conceivably this could be inflammatory or due to an active Schmorl's node, I suspect that there is an adjacent disc protrusion. Maximum  SUV of this small focal lesion is 6.6. I am skeptical of a small metastatic lesion although strictly speaking a small metastatic lesion is not laterally excluded. There is however considerable lumbar spondylosis and some degree of scoliosis with anterolisthesis at the L4-5 level and suspected multilevel impingement. IMPRESSION: 1. 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 There is associated hypermetabolic bilateral station IIa and left station III adenopathy. 2. There is a small focus of hypermetabolic activity along the posterior inferior endplate of L5. I suspect that this is probably due to an active Schmorl's node given the adjacent suspected disc protrusion. Strictly speaking, a small bony metastatic lesion, while seemingly unlikely, is not readily excluded. Given the degree of lumbar spondylosis, scoliosis, and degenerative disc disease, lumbar spine MRI should be considered both to rule out osseous metastatic disease and also characterize the degree of impingement. 3. Other imaging findings of potential clinical significance: Mild cardiomegaly. Coronary, aortic arch, and branch vessel atherosclerotic vascular disease. Prior CABG and prosthetic mitral valve. Bilateral pleural effusions. Small loculated component of the right pleural effusion extends and the right third intercostal space in between the pectoralis muscles, possibly related to a prior thoracotomy.  Cholelithiasis. Bilateral nonobstructive nephrolithiasis. Enlarged prostate gland. Descending and sigmoid colon diverticulosis. Electronically Signed   By: Van Clines M.D.   On: 07/06/2016 14:46   [place] Art/ven Flow Abd Pelv Doppler  Result Date: 07/20/2016 CLINICAL DATA:  One week history of scrotal pain EXAM: SCROTAL ULTRASOUND DOPPLER ULTRASOUND OF THE TESTICLES TECHNIQUE: Complete ultrasound  examination of the testicles, epididymis, and other scrotal structures was performed. Color and spectral Doppler ultrasound were also utilized to evaluate blood flow to the testicles. COMPARISON:  None. FINDINGS: Right testicle Measurements: 5.2 x 4.0 x 3.7 cm. The testicular parenchyma is diffusely inhomogeneous with areas of relative hyperechoic and hypoechoic parenchyma. No well-defined mass seen. No hyperemia seen on the right. Left testicle Measurements: 4.9 x 3.8 x 3.8 cm. Testicular parenchyma is diffusely inhomogeneous with areas of relative hyperechoic and hypoechoic testicular parenchyma. Appearance is similar to the contralateral side. No well-defined mass or hyperemia seen on the left. Right epididymis:  Normal in size and appearance. Left epididymis:  Normal in size and appearance. Hydrocele:  Moderate hydroceles bilaterally. Varicocele: Small varicocele on the left. No varicocele on the right. Pulsed Doppler interrogation of both testes demonstrates normal low resistance arterial and venous waveforms bilaterally. No scrotal abscess or scrotal wall thickening. IMPRESSION: Diffusely inhomogeneous testicular parenchyma bilaterally without well-defined mass on either side. No hyperemia. This appearance raises concern for infiltrative type lesions within each testis. 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 More infiltrative type lesions such is extraosseous multiple myeloma or lymphoma can involve both testes simultaneously and must be of concern. The lack of hyperemia makes bilateral orchitis seen less likely, although the inhomogeneous appearance of the testicular parenchyma could be due to infectious etiology. Moderate hydroceles bilaterally. No extratesticular mass.  No torsion on either side. These results will be called to the ordering clinician or representative by the Radiologist Assistant, and communication documented in the PACS or zVision Dashboard. Electronically Signed   By: Lowella Grip III M.D.   On: 07/20/2016 16:32       Subjective: Patient feeling better, no nausea or vomiting, no chest pain, no dyspnea.   Discharge Exam: Vitals:   07/22/16 2156 07/23/16 0522  BP: 116/73 107/87  Pulse: 96 92  Resp: 18 18  Temp: 98 F (36.7 C) 98.1 F (36.7 C)   Vitals:   07/22/16 2106 07/22/16 2156 07/23/16 0310 07/23/16 0522  BP: 128/68 116/73  107/87  Pulse: 96 96  92  Resp: _0 Temp: 97.9 F (36.6 C) 98 F (36.7 C)  98.1 F (36.7 C)  TempSrc: Oral Oral  Oral  SpO2: 94% 95%  97%  Weight:   86.3 kg (190 lb 4.1 oz)   Height:        General: Pt is alert, awake, not in acute distress Cardiovascular: RRR, S1/S2 +, no rubs, no gallops Respiratory: CTA bilaterally, no wheezing, no rhonchi Abdominal: Soft, NT, ND, bowel sounds + Extremities: no edema, no cyanosis    The results of significant diagnostics from this hospitalization (including imaging, microbiology, ancillary and laboratory) are listed below for reference.     Microbiology: Recent Results (from the past 240 hour(s))  Blood culture (routine x 2)     Status: None (Preliminary result)   Collection Time: 07/19/16 11:56 AM  Result Value Ref Range Status   Specimen Description BLOOD LEFT ANTECUBITAL  Final   Special Requests IN PEDIATRIC BOTTLE 3.5CC  Final   Culture   Final    NO GROWTH  3 DAYS Performed at [facility], [place] [address] [place], [place] [address]    Report Status PENDING  Incomplete  Urine culture     Status: None   Collection Time: 07/19/16 11:56 AM  Result Value Ref Range Status   Specimen Description URINE, RANDOM  Final   Special Requests NONE  Final   Culture   Final    NO GROWTH Performed at [facility], [address], [place], [place] [address]    Report Status 07/20/2016 FINAL  Final  Blood culture (routine x 2)     Status: None (Preliminary result)   Collection Time: 07/19/16 12:03 PM  Result Value Ref Range Status   Specimen  Description BLOOD LEFT HAND  Final   Special Requests IN PEDIATRIC BOTTLE 3CC  Final   Culture   Final    NO GROWTH 3 DAYS Performed at [facility], [place] [address], [place], [place] [address]    Report Status PENDING  Incomplete  C difficile quick scan w PCR reflex     Status: Abnormal   Collection Time: 07/21/16  6:46 AM  Result Value Ref Range Status   C Diff antigen POSITIVE (A) NEGATIVE Final   C Diff toxin NEGATIVE NEGATIVE Final   C Diff interpretation Results are indeterminate. See PCR results.  Final  Clostridium Difficile by PCR     Status: Abnormal   Collection Time: 07/21/16  6:46 AM  Result Value Ref Range Status   Toxigenic C Difficile by pcr POSITIVE (A) NEGATIVE Final    Comment: Positive for toxigenic C. difficile with little to no toxin production. Only treat if clinical presentation suggests symptomatic illness. Performed at [facility], [place] [address] [place], [place] [address]      Labs: BNP (last 3 results)  Recent Labs  11/05/15 0418 11/06/15 0334 06/11/16 1035  BNP 867.3* 613.6* 409.7*   Basic Metabolic Panel:  Recent Labs Lab 07/19/16 1016 07/20/16 0353 07/21/16 0512 07/22/16 0429 07/23/16 0130 07/23/16 0546  NA 134* 139 134*  --  134* 133*  K 4.3 4.0 3.7  --  3.6 3.5  CL 101 106 102  --  102 102  CO2 _0 --  25 26  GLUCOSE 125* 108* 104*  --  113* 131*  BUN 43* 42* 43*  --  41* 40*  CREATININE 2.32* 2.27* 2.31* 2.31* 2.17* 1.97*  CALCIUM 8.6* 8.4* 8.2*  --  8.1* 8.0*  MG  --   --   --   --  2.2  --    Liver Function Tests:  Recent Labs Lab 07/19/16 1016 07/21/16 0512  AST 237* 93*  ALT 358* 232*  ALKPHOS 81 67  BILITOT 0.9 1.3*  PROT 6.1* 5.8*  ALBUMIN 3.3* 2.8*   No results for input(s): LIPASE, AMYLASE in the last 168 hours. No results for input(s): AMMONIA in the last 168 hours. CBC:  Recent Labs Lab 07/19/16 1016 07/20/16 0353 07/21/16 0512 07/22/16 0429 07/23/16 0546  WBC 27.8* 27.4* 26.4*  22.3* 18.1*  NEUTROABS 24.4*  --   --   --  14.6*  HGB 10.3* 9.8* 10.0* 9.7* 9.5*  HCT 31.1* 29.5* 31.1* 30.8* 29.6*  MCV 94.5 94.6 93.7 94.2 94.0  PLT 72* 71* 74* 77* 68*   Cardiac Enzymes:  Recent Labs Lab 07/23/16 0130 07/23/16 0704  TROPONINI 0.13* 0.11*   BNP: Invalid input(s): POCBNP CBG: No results for input(s): GLUCAP in the  last 168 hours. D-Dimer No results for input(s): DDIMER in the last 72 hours. Hgb A1c No results for input(s): HGBA1C in the last 72 hours. Lipid Profile No results for input(s): CHOL, HDL, LDLCALC, TRIG, CHOLHDL, LDLDIRECT in the last 72 hours. Thyroid function studies No results for input(s): TSH, T4TOTAL, T3FREE, THYROIDAB in the last 72 hours.  Invalid input(s): FREET3 Anemia work up No results for input(s): VITAMINB12, FOLATE, FERRITIN, TIBC, IRON, RETICCTPCT in the last 72 hours. Urinalysis    Component Value Date/Time   COLORURINE YELLOW 07/19/2016 1156   APPEARANCEUR CLOUDY (A) 07/19/2016 1156   LABSPEC 1.024 07/19/2016 1156   PHURINE 5.0 07/19/2016 1156   GLUCOSEU 50 (A) 07/19/2016 1156   HGBUR MODERATE (A) 07/19/2016 1156   BILIRUBINUR NEGATIVE 07/19/2016 1156   KETONESUR 5 (A) 07/19/2016 1156   PROTEINUR 100 (A) 07/19/2016 1156   UROBILINOGEN 0.2 01/03/2013 1119   NITRITE NEGATIVE 07/19/2016 1156   LEUKOCYTESUR NEGATIVE 07/19/2016 1156   Sepsis Labs Invalid input(s): PROCALCITONIN,  WBC,  LACTICIDVEN Microbiology Recent Results (from the past 240 hour(s))  Blood culture (routine x 2)     Status: None (Preliminary result)   Collection Time: 07/19/16 11:56 AM  Result Value Ref Range Status   Specimen Description BLOOD LEFT ANTECUBITAL  Final   Special Requests IN PEDIATRIC BOTTLE 3.5CC  Final   Culture   Final    NO GROWTH 3 DAYS Performed at [facility], [place] [address] [place], [place] [address]    Report Status PENDING  Incomplete  Urine culture     Status: None   Collection Time: 07/19/16 11:56 AM  Result  Value Ref Range Status   Specimen Description URINE, RANDOM  Final   Special Requests NONE  Final   Culture   Final    NO GROWTH Performed at [facility], [address], [place], [place] [address]    Report Status 07/20/2016 FINAL  Final  Blood culture (routine x 2)     Status: None (Preliminary result)   Collection Time: 07/19/16 12:03 PM  Result Value Ref Range Status   Specimen Description BLOOD LEFT HAND  Final   Special Requests IN PEDIATRIC BOTTLE 3CC  Final   Culture   Final    NO GROWTH 3 DAYS Performed at [facility], [place] [address] [place], [place] [address]    Report Status PENDING  Incomplete  C difficile quick scan w PCR reflex     Status: Abnormal   Collection Time: 07/21/16  6:46 AM  Result Value Ref Range Status   C Diff antigen POSITIVE (A) NEGATIVE Final   C Diff toxin NEGATIVE NEGATIVE Final   C Diff interpretation Results are indeterminate. See PCR results.  Final  Clostridium Difficile by PCR     Status: Abnormal   Collection Time: 07/21/16  6:46 AM  Result Value Ref Range Status   Toxigenic C Difficile by pcr POSITIVE (A) NEGATIVE Final    Comment: Positive for toxigenic C. difficile with little to no toxin production. Only treat if clinical presentation suggests symptomatic illness. Performed at [facility], [employer] [address] [place], [place] [address]      Time coordinating discharge: 45 minutes  SIGNED:   Tawni Millers, MD  [facility] 07/23/2016, 1:11 PM Pager   If 7PM-7AM, please contact night-coverage [email or web address] Password [email or web address]

## 2016-07-23 NOTE — Care Management Important Message (Signed)
Important Message  Patient Details IM Letter given to Cookie/Case Manager to present to Patient Name: Peter Bruce MRN: [id number] Date of Birth: August 21, 1950   Medicare Important Message Given:  Yes    Kerin Salen 07/23/2016, 10:39 AMImportant Message  Patient Details  Name: Peter Bruce MRN: [id number] Date of Birth: 1950/06/29   Medicare Important Message Given:  Yes    Kerin Salen 07/23/2016, 10:39 AM

## 2016-07-23 NOTE — Plan of Care (Signed)
Problem: Activity: Goal: Ability to tolerate increased activity will improve Outcome: Progressing Walked with PT today using FWW.

## 2016-07-23 NOTE — Progress Notes (Signed)
CRITICAL VALUE ALERT  Critical value received:  Troponin 0.13  Date of notification:  07/23/16  Time of notification: 0220  Critical value read back: Yes   Nurse who received alert:  Elwin Sleight RN   MD notified (1st page):  Hal Hope   Time of first page:  0222  MD notified (2nd page):  Time of second page:  Responding MD:    Time MD responded:

## 2016-07-23 NOTE — Progress Notes (Signed)
Physical Therapy Treatment Patient Details Name: Peter Bruce MRN: [id number] DOB: Sep 29, 1950 Today's Date: 07/23/2016    History of Present Illness 66 yo male admitted with Pna, confusion. hx of CVA, endocarditis, CAD, PE, A fib, DVt, lupus, CABG, CHF, arthritis, tongue cancer.     PT Comments    Progressing slowly but limited by general weakness and scrotal pain.    Follow Up Recommendations  Supervision/Assistance - 24 hour (pt and family decline HH f/u)     Equipment Recommendations  None recommended by PT    Recommendations for Other Services       Precautions / Restrictions Precautions Precautions: Fall Restrictions Weight Bearing Restrictions: No    Mobility  Bed Mobility Overal bed mobility: Modified Independent Bed Mobility: Sidelying to Sit;Sit to Supine           General bed mobility comments: increased time. HOB elevated. Pt used bedrail.   Transfers Overall transfer level: Needs assistance Equipment used: Rolling walker (2 wheeled) Transfers: Sit to/from Stand Sit to Stand: Min assist         General transfer comment: Assist to rise, stabilize. Increased time. VCs hand placement.   Ambulation/Gait Ambulation/Gait assistance: Min assist Ambulation Distance (Feet): 75 Feet Assistive device: Rolling walker (2 wheeled) Gait Pattern/deviations: Step-through pattern;Decreased stride length     General Gait Details: Intermittent assist to stabilize. 3 brief standing rest breaks taken/needed. Mod VCs for safe use or walker, proper hand placement.    Stairs            Wheelchair Mobility    Modified Rankin (Stroke Patients Only)       Balance             Standing balance-Leahy Scale: Poor Standing balance comment: requiring RW currently                    Cognition Arousal/Alertness: Awake/alert Behavior During Therapy: WFL for tasks assessed/performed Overall Cognitive Status: Impaired/Different from baseline Area of  Impairment: Safety/judgement         Safety/Judgement: Decreased awareness of deficits;Decreased awareness of safety          Exercises      General Comments        Pertinent Vitals/Pain Pain Assessment: 0-10 Faces Pain Scale: Hurts whole lot Pain Location: scrotum, back Pain Descriptors / Indicators: Grimacing;Sore;Tender Pain Intervention(s): Limited activity within patient's tolerance    Home Living                      Prior Function            PT Goals (current goals can now be found in the care plan section) Progress towards PT goals: Progressing toward goals    Frequency    Min 3X/week      PT Plan Current plan remains appropriate    Co-evaluation             End of Session Equipment Utilized During Treatment: Gait belt Activity Tolerance: Patient limited by fatigue;Patient limited by pain Patient left: in bed;with call bell/phone within reach;with bed alarm set   PT Visit Diagnosis: Muscle weakness (generalized) (M62.81);Difficulty in walking, not elsewhere classified (R26.2)     Time: 9371-6967 PT Time Calculation (min) (ACUTE ONLY): 15 min  Charges:  $Gait Training: 8-22 mins                    G Codes:       Weston Anna, MPT  Pager: [phone]

## 2016-07-24 ENCOUNTER — Encounter (HOSPITAL_COMMUNITY): Payer: Self-pay | Admitting: *Deleted

## 2016-07-24 ENCOUNTER — Emergency Department (HOSPITAL_COMMUNITY): Payer: Medicare Other

## 2016-07-24 ENCOUNTER — Inpatient Hospital Stay (HOSPITAL_COMMUNITY)
Admission: EM | Admit: 2016-07-24 | Discharge: 2016-07-29 | DRG: 064 | Disposition: A | Discharge status: Skilled Nursing Facility | Payer: Medicare Other | Attending: Neurology | Admitting: Neurology

## 2016-07-24 DIAGNOSIS — T826XXA Infection and inflammatory reaction due to cardiac valve prosthesis, initial encounter: Secondary | ICD-10-CM | POA: Diagnosis present

## 2016-07-24 DIAGNOSIS — D696 Thrombocytopenia, unspecified: Secondary | ICD-10-CM

## 2016-07-24 DIAGNOSIS — Z7901 Long term (current) use of anticoagulants: Secondary | ICD-10-CM

## 2016-07-24 DIAGNOSIS — I058 Other rheumatic mitral valve diseases: Secondary | ICD-10-CM | POA: Diagnosis present

## 2016-07-24 DIAGNOSIS — A0472 Enterocolitis due to Clostridium difficile, not specified as recurrent: Secondary | ICD-10-CM | POA: Diagnosis present

## 2016-07-24 DIAGNOSIS — C01 Malignant neoplasm of base of tongue: Secondary | ICD-10-CM | POA: Diagnosis present

## 2016-07-24 DIAGNOSIS — I5032 Chronic diastolic (congestive) heart failure: Secondary | ICD-10-CM | POA: Diagnosis present

## 2016-07-24 DIAGNOSIS — Z79899 Other long term (current) drug therapy: Secondary | ICD-10-CM

## 2016-07-24 DIAGNOSIS — R402242 Coma scale, best verbal response, confused conversation, at arrival to emergency department: Secondary | ICD-10-CM | POA: Diagnosis present

## 2016-07-24 DIAGNOSIS — R262 Difficulty in walking, not elsewhere classified: Secondary | ICD-10-CM | POA: Diagnosis present

## 2016-07-24 DIAGNOSIS — Z86711 Personal history of pulmonary embolism: Secondary | ICD-10-CM

## 2016-07-24 DIAGNOSIS — R404 Transient alteration of awareness: Secondary | ICD-10-CM | POA: Diagnosis not present

## 2016-07-24 DIAGNOSIS — I481 Persistent atrial fibrillation: Secondary | ICD-10-CM | POA: Diagnosis present

## 2016-07-24 DIAGNOSIS — Z8249 Family history of ischemic heart disease and other diseases of the circulatory system: Secondary | ICD-10-CM

## 2016-07-24 DIAGNOSIS — Z9114 Patient's other noncompliance with medication regimen: Secondary | ICD-10-CM

## 2016-07-24 DIAGNOSIS — I959 Hypotension, unspecified: Secondary | ICD-10-CM | POA: Diagnosis present

## 2016-07-24 DIAGNOSIS — A419 Sepsis, unspecified organism: Secondary | ICD-10-CM | POA: Diagnosis present

## 2016-07-24 DIAGNOSIS — Z86718 Personal history of other venous thrombosis and embolism: Secondary | ICD-10-CM

## 2016-07-24 DIAGNOSIS — E663 Overweight: Secondary | ICD-10-CM | POA: Diagnosis present

## 2016-07-24 DIAGNOSIS — D638 Anemia in other chronic diseases classified elsewhere: Secondary | ICD-10-CM

## 2016-07-24 DIAGNOSIS — Z7952 Long term (current) use of systemic steroids: Secondary | ICD-10-CM

## 2016-07-24 DIAGNOSIS — I4892 Unspecified atrial flutter: Secondary | ICD-10-CM | POA: Diagnosis present

## 2016-07-24 DIAGNOSIS — R001 Bradycardia, unspecified: Secondary | ICD-10-CM | POA: Diagnosis not present

## 2016-07-24 DIAGNOSIS — E785 Hyperlipidemia, unspecified: Secondary | ICD-10-CM | POA: Diagnosis present

## 2016-07-24 DIAGNOSIS — E778 Other disorders of glycoprotein metabolism: Secondary | ICD-10-CM | POA: Diagnosis present

## 2016-07-24 DIAGNOSIS — N5089 Other specified disorders of the male genital organs: Secondary | ICD-10-CM | POA: Diagnosis present

## 2016-07-24 DIAGNOSIS — R402362 Coma scale, best motor response, obeys commands, at arrival to emergency department: Secondary | ICD-10-CM | POA: Diagnosis present

## 2016-07-24 DIAGNOSIS — R29708 NIHSS score 8: Secondary | ICD-10-CM | POA: Diagnosis present

## 2016-07-24 DIAGNOSIS — I10 Essential (primary) hypertension: Secondary | ICD-10-CM | POA: Diagnosis present

## 2016-07-24 DIAGNOSIS — Y92009 Unspecified place in unspecified non-institutional (private) residence as the place of occurrence of the external cause: Secondary | ICD-10-CM

## 2016-07-24 DIAGNOSIS — R471 Dysarthria and anarthria: Secondary | ICD-10-CM | POA: Diagnosis present

## 2016-07-24 DIAGNOSIS — I471 Supraventricular tachycardia: Secondary | ICD-10-CM | POA: Diagnosis present

## 2016-07-24 DIAGNOSIS — R402421 Glasgow coma scale score 9-12, in the field [EMT or ambulance]: Secondary | ICD-10-CM | POA: Diagnosis not present

## 2016-07-24 DIAGNOSIS — I2510 Atherosclerotic heart disease of native coronary artery without angina pectoris: Secondary | ICD-10-CM | POA: Diagnosis present

## 2016-07-24 DIAGNOSIS — Z888 Allergy status to other drugs, medicaments and biological substances status: Secondary | ICD-10-CM

## 2016-07-24 DIAGNOSIS — R402132 Coma scale, eyes open, to sound, at arrival to emergency department: Secondary | ICD-10-CM | POA: Diagnosis present

## 2016-07-24 DIAGNOSIS — I25119 Atherosclerotic heart disease of native coronary artery with unspecified angina pectoris: Secondary | ICD-10-CM | POA: Diagnosis present

## 2016-07-24 DIAGNOSIS — R760 Raised antibody titer: Secondary | ICD-10-CM

## 2016-07-24 DIAGNOSIS — R55 Syncope and collapse: Secondary | ICD-10-CM

## 2016-07-24 DIAGNOSIS — T45516A Underdosing of anticoagulants, initial encounter: Secondary | ICD-10-CM | POA: Diagnosis present

## 2016-07-24 DIAGNOSIS — I059 Rheumatic mitral valve disease, unspecified: Secondary | ICD-10-CM | POA: Diagnosis present

## 2016-07-24 DIAGNOSIS — R4182 Altered mental status, unspecified: Secondary | ICD-10-CM | POA: Diagnosis not present

## 2016-07-24 DIAGNOSIS — Z8673 Personal history of transient ischemic attack (TIA), and cerebral infarction without residual deficits: Secondary | ICD-10-CM

## 2016-07-24 DIAGNOSIS — Y831 Surgical operation with implant of artificial internal device as the cause of abnormal reaction of the patient, or of later complication, without mention of misadventure at the time of the procedure: Secondary | ICD-10-CM | POA: Diagnosis present

## 2016-07-24 DIAGNOSIS — M353 Polymyalgia rheumatica: Secondary | ICD-10-CM

## 2016-07-24 DIAGNOSIS — Z951 Presence of aortocoronary bypass graft: Secondary | ICD-10-CM

## 2016-07-24 DIAGNOSIS — I110 Hypertensive heart disease with heart failure: Secondary | ICD-10-CM | POA: Diagnosis present

## 2016-07-24 DIAGNOSIS — I330 Acute and subacute infective endocarditis: Secondary | ICD-10-CM | POA: Diagnosis present

## 2016-07-24 DIAGNOSIS — D6859 Other primary thrombophilia: Secondary | ICD-10-CM | POA: Diagnosis present

## 2016-07-24 DIAGNOSIS — G936 Cerebral edema: Secondary | ICD-10-CM | POA: Diagnosis present

## 2016-07-24 DIAGNOSIS — Z8661 Personal history of infections of the central nervous system: Secondary | ICD-10-CM

## 2016-07-24 DIAGNOSIS — Z6827 Body mass index (BMI) 27.0-27.9, adult: Secondary | ICD-10-CM

## 2016-07-24 DIAGNOSIS — I480 Paroxysmal atrial fibrillation: Secondary | ICD-10-CM | POA: Diagnosis present

## 2016-07-24 DIAGNOSIS — Z85810 Personal history of malignant neoplasm of tongue: Secondary | ICD-10-CM

## 2016-07-24 DIAGNOSIS — I63442 Cerebral infarction due to embolism of left cerebellar artery: Principal | ICD-10-CM | POA: Diagnosis present

## 2016-07-24 DIAGNOSIS — A498 Other bacterial infections of unspecified site: Secondary | ICD-10-CM | POA: Diagnosis present

## 2016-07-24 DIAGNOSIS — I639 Cerebral infarction, unspecified: Secondary | ICD-10-CM

## 2016-07-24 LAB — COMPREHENSIVE METABOLIC PANEL
ALBUMIN: 2.4 g/dL — AB (ref 3.5–5.0)
ALT: 94 U/L — ABNORMAL HIGH (ref 17–63)
ANION GAP: 8 (ref 5–15)
AST: 44 U/L — ABNORMAL HIGH (ref 15–41)
Alkaline Phosphatase: 46 U/L (ref 38–126)
BUN: 36 mg/dL — ABNORMAL HIGH (ref 6–20)
CO2: 24 mmol/L (ref 22–32)
Calcium: 8 mg/dL — ABNORMAL LOW (ref 8.9–10.3)
Chloride: 104 mmol/L (ref 101–111)
Creatinine, Ser: 1.66 mg/dL — ABNORMAL HIGH (ref 0.61–1.24)
GFR calc Af Amer: 48 mL/min — ABNORMAL LOW (ref >60)
GFR calc non Af Amer: 41 mL/min — ABNORMAL LOW (ref >60)
GLUCOSE: 124 mg/dL — AB (ref 65–99)
POTASSIUM: 4.2 mmol/L (ref 3.5–5.1)
SODIUM: 136 mmol/L (ref 135–145)
Total Bilirubin: 1 mg/dL (ref 0.3–1.2)
Total Protein: 5.2 g/dL — ABNORMAL LOW (ref 6.5–8.1)

## 2016-07-24 LAB — URINALYSIS, ROUTINE W REFLEX MICROSCOPIC
BILIRUBIN URINE: NEGATIVE
GLUCOSE, UA: 150 mg/dL — AB
KETONES UR: NEGATIVE mg/dL
NITRITE: NEGATIVE
PH: 5 (ref 5.0–8.0)
Protein, ur: 100 mg/dL — AB
SQUAMOUS EPITHELIAL / LPF: NONE SEEN
Specific Gravity, Urine: 1.019 (ref 1.005–1.030)

## 2016-07-24 LAB — I-STAT VENOUS BLOOD GAS, ED
Acid-base deficit: 2 mmol/L (ref 0.0–2.0)
Bicarbonate: 22.4 mmol/L (ref 20.0–28.0)
O2 Saturation: 64 %
PO2 VEN: 32 mmHg (ref 32.0–45.0)
TCO2: 23 mmol/L (ref 0–100)
pCO2, Ven: 34.3 mmHg — ABNORMAL LOW (ref 44.0–60.0)
pH, Ven: 7.422 (ref 7.250–7.430)

## 2016-07-24 LAB — I-STAT CHEM 8, ED
BUN: 36 mg/dL — ABNORMAL HIGH (ref 6–20)
CHLORIDE: 105 mmol/L (ref 101–111)
CREATININE: 1.6 mg/dL — AB (ref 0.61–1.24)
Calcium, Ion: 1.07 mmol/L — ABNORMAL LOW (ref 1.15–1.40)
GLUCOSE: 119 mg/dL — AB (ref 65–99)
HEMATOCRIT: 29 % — AB (ref 39.0–52.0)
Hemoglobin: 9.9 g/dL — ABNORMAL LOW (ref 13.0–17.0)
POTASSIUM: 4.1 mmol/L (ref 3.5–5.1)
Sodium: 137 mmol/L (ref 135–145)
TCO2: 23 mmol/L (ref 0–100)

## 2016-07-24 LAB — CULTURE, BLOOD (ROUTINE X 2)
Culture: NO GROWTH
Culture: NO GROWTH

## 2016-07-24 LAB — CBC WITH DIFFERENTIAL/PLATELET
BASOS PCT: 0 %
Basophils Absolute: 0.1 10*3/uL (ref 0.0–0.1)
EOS ABS: 0.3 10*3/uL (ref 0.0–0.7)
EOS PCT: 1 %
HCT: 30 % — ABNORMAL LOW (ref 39.0–52.0)
Hemoglobin: 9.6 g/dL — ABNORMAL LOW (ref 13.0–17.0)
LYMPHS ABS: 1.6 10*3/uL (ref 0.7–4.0)
Lymphocytes Relative: 8 %
MCH: 30.6 pg (ref 26.0–34.0)
MCHC: 32 g/dL (ref 30.0–36.0)
MCV: 95.5 fL (ref 78.0–100.0)
MONO ABS: 1.1 10*3/uL — AB (ref 0.1–1.0)
Monocytes Relative: 6 %
Neutro Abs: 15.7 10*3/uL — ABNORMAL HIGH (ref 1.7–7.7)
Neutrophils Relative %: 84 %
PLATELETS: 72 10*3/uL — AB (ref 150–400)
RBC: 3.14 MIL/uL — ABNORMAL LOW (ref 4.22–5.81)
RDW: 17.8 % — AB (ref 11.5–15.5)
WBC: 18.6 10*3/uL — AB (ref 4.0–10.5)

## 2016-07-24 LAB — I-STAT TROPONIN, ED: Troponin i, poc: 0.04 ng/mL (ref 0.00–0.08)

## 2016-07-24 LAB — I-STAT CG4 LACTIC ACID, ED: LACTIC ACID, VENOUS: 1.35 mmol/L (ref 0.5–1.9)

## 2016-07-24 LAB — BRAIN NATRIURETIC PEPTIDE: B Natriuretic Peptide: 1931.1 pg/mL — ABNORMAL HIGH (ref 0.0–100.0)

## 2016-07-24 LAB — MAGNESIUM: MAGNESIUM: 2 mg/dL (ref 1.7–2.4)

## 2016-07-24 LAB — TSH: TSH: 3.397 u[IU]/mL (ref 0.350–4.500)

## 2016-07-24 LAB — AMMONIA: Ammonia: 10 umol/L (ref 9–35)

## 2016-07-24 MED ORDER — VANCOMYCIN HCL IN DEXTROSE 750-5 MG/150ML-% IV SOLN
750.0000 mg | Freq: Two times a day (BID) | INTRAVENOUS | Status: DC
  Administered 2016-07-25: 750 mg via INTRAVENOUS
  Filled 2016-07-24 (×3): qty 150

## 2016-07-24 MED ORDER — DEXTROSE 5 % IV SOLN
1.0000 g | INTRAVENOUS | Status: AC
  Administered 2016-07-24: 1 g via INTRAVENOUS
  Filled 2016-07-24: qty 1

## 2016-07-24 MED ORDER — SODIUM CHLORIDE 0.9 % IV BOLUS (SEPSIS)
500.0000 mL | Freq: Once | INTRAVENOUS | Status: AC
  Administered 2016-07-24: 500 mL via INTRAVENOUS

## 2016-07-24 MED ORDER — VANCOMYCIN HCL 10 G IV SOLR
1500.0000 mg | INTRAVENOUS | Status: AC
  Administered 2016-07-24: 1500 mg via INTRAVENOUS
  Filled 2016-07-24: qty 1500

## 2016-07-24 NOTE — ED Notes (Signed)
Ed res at the bedside  Non-rebreather  Removed and a nasal cannula placed at 3 liters to see if sats   Stay normal

## 2016-07-24 NOTE — ED Notes (Signed)
The pt  Is resting at present.  Vancomycin is half infused

## 2016-07-24 NOTE — ED Notes (Signed)
Portable chest xray done.

## 2016-07-24 NOTE — ED Notes (Signed)
The pts wife just arrived

## 2016-07-24 NOTE — ED Notes (Signed)
The pt did not have c-diff  According to his records by the ed res

## 2016-07-24 NOTE — ED Notes (Signed)
The  Pt has swelling around his penis

## 2016-07-24 NOTE — ED Notes (Signed)
Ed res attempting an ultra sound

## 2016-07-24 NOTE — ED Notes (Signed)
The pt rouses occasionally and mumbles words sometimes understandable other times not.  nsr on the monitor  sats good.  Wife at the  Bedside.  Vancomycin still infusing

## 2016-07-24 NOTE — H&P (Signed)
Peter Bruce SFK:[id number] DOB: [birth year] DOA: 07/24/2016     PCP: Adrian Prows, MD   Outpatient Specialists: [facility] cardiology  RAD onc Isidore Moos, [name] Patient coming from:  home Lives  With family   Chief Complaint: Found unresponsive  HPI: Peter Bruce is a 66 y.o. 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  Presented with episode of unresponsiveness. Patient woke up from a nap and his wife heard some noise in fact he was to the bathroom.  she have heard   a short episode of possibly screaming she found him having agonal respirations and poorly responsive.  EMS was called he was groaning on their arrival he was noted to be bradycardic down to 30s BP 50s he was given 500 mL of IV fluids with some improvement and started on  nonrebreather and pacing was started after administration of Versed. Patient still has white blood cell count elevation from prior admission bradycardia and hypotension colds sepsis was initiated he was started on vancomycin and cefepime Patient slowly will recovered while in emergency department with improvement of heart rate and currently down to 3 L nasal cannula alert and  oriented   Just discharged to home from [facility] yesterday he has recently been admitted for generalized anasarca and was found to have healthcare associated pneumonia with acute hypoxic respiratory failure treated with IV vancomycin and Zosyn blood cultures unremarkable oximetry bedtime at discharge was 97% room air at the time of discharge white blood cell count 18.1 plan was for him to continue on Augmentin for next 4 days. Patient has history of endocarditis and septic emboli to the brain during his past  admission ID has been consulted patient finished IV course of antibiotics in the hospital his PICC line was removed. 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 by oncology and radiation oncology Regarding hx of CAD undergone CABG 3 on 05/29/2015 severe MR, underwent minimally invasive mitral valve repair on 11/21/2015  IN ER:  Temp (24hrs), Avg:99.7 F (37.6 C), Min:99.7 F (37.6 C), Max:99.7 F (37.6 C)    RR 21 HR 82 BP 120/101 Ammonia 10 TSH 3.397  UA showing large number of red blood cells  Lactic acid 1.35 VBG 7.422/34 Sodium 137 potassium 4.1 creatinine 1.6 which is close to baseline hemoglobin 9.9  WBC 18.6 which is similar to yesterday hemoglobin 9.6 platelets 72 which is up from yesterday of 47  BMP 1931 CT head showing focal low attenuation in left anterior temporal lobe consistent with progression of infarct in the distribution of the left middle cerebral artery as previously seen on MRI this new area of low attenuation in the left inferior cerebellar hemisphere associated mass effect the distribution of left posterior-inferior cerebral artery.  CXR -  improvement from prior stable cardiomegaly  Following Medications were ordered in ER: Medications  vancomycin (VANCOCIN) IVPB 750 mg/150 ml premix (not administered)  sodium chloride 0.9 % bolus 500 mL (0  mLs Intravenous Stopped 07/24/16 2046)  ceFEPIme (MAXIPIME) 1 g in dextrose 5 % 50 mL IVPB (0 g Intravenous Stopped 07/24/16 2107)  vancomycin (VANCOCIN) 1,500 mg in sodium chloride 0.9 % 500 mL IVPB (1,500 mg Intravenous New Bag/Given 07/24/16 2108)     Hospitalist was called for admission  for Syncopal episode in the setting of bradycardia newly diagnosed CVA  Review of Systems:    Pertinent positives include: unresponsive event shortness of breath at rest.  dyspnea on exertion Constitutional:  No weight loss, night sweats, Fevers, chills, fatigue, weight loss  HEENT:  No headaches, Difficulty swallowing,Tooth/dental problems,Sore throat,  No sneezing, itching, ear ache, nasal congestion, post nasal drip,  Cardio-vascular:  No chest pain, Orthopnea, PND, anasarca, dizziness, palpitations.no Bilateral lower extremity swelling  GI:  No heartburn, indigestion, abdominal pain, nausea, vomiting, diarrhea, change in bowel habits, loss of appetite, melena, blood in stool, hematemesis Resp:   , No excess mucus, no productive cough, No non-productive cough, No coughing up of blood.No change in color of mucus.No wheezing. Skin:  no rash or lesions. No jaundice GU:  no dysuria, change in color of urine, no urgency or frequency. No straining to urinate.  No flank pain.  Musculoskeletal:  No joint pain or no joint swelling. No decreased range of motion. No back pain.  Psych:  No change in mood or affect. No depression or anxiety. No memory loss.  Neuro: no localizing neurological complaints, no tingling, no weakness, no double vision, no gait abnormality, no slurred speech, no confusion  As per HPI otherwise 10 point review of systems negative.   Past Medical History: Past Medical History:  Diagnosis Date  . Acute CHF (congestive heart failure) ([place]) 11/01/2015  . Acute on chronic diastolic heart failure ([place]) 09/27/2015  . Acute pulmonary embolus ([place]) 2010  . Acute respiratory failure with hypoxia ([place]) 09/09/2015  . Arthritis   . Atrial fibrillation [facility])    s/p inpatient DCCV 09/11/2015  . Atrial flutter ([place])   . CHF (congestive heart failure) ([place])   . Coronary artery disease involving native coronary artery 05/15/2015   multivessel  . Dysrhythmia    A-fib  . Essential  hypertension   . Fibromyalgia   . History of kidney stones   . Hypercholesterolemia   . Hypertension   . Lupus anticoagulant positive 07/29/2008  . Mitral regurgitation 10/30/2015  . PMR (polymyalgia rheumatica) (HCC)   . Pneumonia   . 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 S/P Maze operation for atrial fibrillation 05/29/2015   Complete bilateral atrial lesion set via median sternotomy using bipolar radiofrequency and cryothermy ablation with clipping of LA appendage  . 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 APPENDECTOMY    . arm surgery  Right    wrist and elbow  . CARDIAC CATHETERIZATION N/A 05/15/2015   Procedure: Left Heart Cath and Coronary Angiography;  Surgeon: Peter M Martinique, MD;  Location: [place] CV LAB;  Service: Cardiovascular;  Laterality: N/A;  . CARDIAC CATHETERIZATION N/A 11/17/2015   Procedure: Right/Left Heart Cath and Coronary/Graft Angiography;  Surgeon: Adrian Prows, MD;  Location: [place] CV LAB;  Service: Cardiovascular;  Laterality: N/A;  . CARDIOVERSION N/A 09/11/2015   Procedure: CARDIOVERSION;  Surgeon: Lelon Perla, MD;  Location: [facility] ENDOSCOPY;  Service: Cardiovascular;  Laterality: N/A;  . CHEST TUBE INSERTION  11/21/2015   Procedure: LEFT CHEST TUBE INSERTION;  Surgeon: Rexene Alberts, MD;  Location: [place];  Service: Open Heart Surgery;;  . CLIPPING OF ATRIAL APPENDAGE  05/29/2015   Procedure: CLIPPING OF LEFT ATRIAL APPENDAGE;  Surgeon: Rexene Alberts, MD;  Location: [place];  Service: Open Heart Surgery;;  45 AtriClip PRO 145  . CORONARY ARTERY BYPASS GRAFT N/A 05/29/2015   Procedure: CORONARY ARTERY BYPASS GRAFTING  (CABG), ON PUMP, TIMES THREE, USING LEFT INTERNAL MAMMARY ARTERY, BILATERAL GREATER SAPHENOUS VEINS HARVESTED ENDOSCOPICALLY;  Surgeon: Rexene Alberts, MD;  Location: [place];  Service: Open Heart Surgery;  Laterality: N/A;  . FRACTURE SURGERY     right wrist  . HERNIA REPAIR    . MAZE N/A 05/29/2015   Procedure: MAZE;  Surgeon: Rexene Alberts, MD;  Location: [place];  Service: Open Heart Surgery;  Laterality: N/A;  Complete Bi-Atrial Lesion set with Ablation and Cryothermy  . MITRAL VALVE REPAIR Right 11/21/2015   Procedure: MINIMALLY INVASIVE REOPERATION FOR MITRAL VALVE REPAIR;  Surgeon: Rexene Alberts, MD;  Location: [place];  Service: Open Heart Surgery;  Laterality: Right;  . PERIPHERAL VASCULAR CATHETERIZATION Bilateral 11/17/2015   Procedure: Renal Angiography;  Surgeon: Adrian Prows, MD;  Location: [place] CV LAB;  Service: Cardiovascular;  Laterality: Bilateral;  . PERIPHERAL VASCULAR CATHETERIZATION N/A 11/17/2015   Procedure: Abdominal Aortogram;  Surgeon: Adrian Prows, MD;  Location: [place] CV LAB;  Service: Cardiovascular;  Laterality: N/A;  . SHOULDER SURGERY Left    clavicular fracture  . TEE WITHOUT CARDIOVERSION N/A 05/29/2015   Procedure: TRANSESOPHAGEAL ECHOCARDIOGRAM (TEE);  Surgeon: Rexene Alberts, MD;  Location: [place];  Service: Open Heart Surgery;  Laterality: N/A;  . TEE WITHOUT CARDIOVERSION N/A 10/30/2015   Procedure: TRANSESOPHAGEAL ECHOCARDIOGRAM (TEE);  Surgeon: Adrian Prows, MD;  Location: [place];  Service: Cardiovascular;  Laterality: N/A;  . TEE WITHOUT CARDIOVERSION N/A 11/21/2015   Procedure: TRANSESOPHAGEAL ECHOCARDIOGRAM (TEE);  Surgeon: Rexene Alberts, MD;  Location: [place];  Service: Open Heart Surgery;  Laterality: N/A;     Social History:  Ambulatory  Gilford Rile     reports that he has never smoked. He has never used smokeless tobacco. He reports that he does not drink alcohol or use drugs.  Allergies:   Allergies  Allergen Reactions  .  Amitriptyline Shortness Of Breath  . Serotonin Reuptake Inhibitors ([place]sris) Nausea And Vomiting       Family History:   Family History  Problem Relation Age of Onset  . Cancer Mother   . Other Sister     chf  . Other Sister     Rosalee Kaufman    Medications: Prior to Admission medications   Medication Sig Start Date End Date Taking? Authorizing Provider  acetaminophen (TYLENOL) 325 MG tablet Take 2 tablets (650 mg total) by mouth every 6 (six) hours as needed for mild pain (or Fever >/= 101). 07/23/16  Yes Mauricio Gerome Apley, MD  amoxicillin-clavulanate (AUGMENTIN) 875-125 MG tablet Take 1 tablet by mouth 2 (two) times daily. 07/23/16  Yes Mauricio Gerome Apley, MD  diazepam (VALIUM) 10 MG tablet Take 10 mg by mouth at bedtime as needed for anxiety or sleep.    Yes Historical Provider, MD  Multiple Vitamin (MULTIVITAMIN WITH MINERALS)  TABS tablet Take 1 tablet by mouth daily.   Yes Historical Provider, MD  ondansetron (ZOFRAN ODT) 8 MG disintegrating tablet Take 1 tablet (8 mg total) by mouth every 8 (eight) hours as needed for nausea or vomiting. 07/16/16  Yes Carlyle Basques, MD  oxyCODONE-acetaminophen (PERCOCET) 7.5-325 MG tablet Take 1 tablet by mouth 3 (three) times daily as needed for severe pain.   Yes Historical Provider, MD  rivaroxaban (XARELTO) 20 MG TABS tablet Take 1 tablet (20 mg total) by mouth daily with supper. 06/17/16  Yes Christina P Rama, MD  tiZANidine (ZANAFLEX) 4 MG tablet Take 2-4 mg by mouth at bedtime as needed for muscle spasms.   Yes Historical Provider, MD  valsartan (DIOVAN) 160 MG tablet Take 1 tablet (160 mg total) by mouth daily. 11/19/15  Yes Adrian Prows, MD  zolpidem (AMBIEN) 10 MG tablet Take 10 mg by mouth at bedtime as needed for sleep.    Yes Historical Provider, MD    Physical Exam: Patient Vitals for the past 24 hrs:  BP Temp Temp src Pulse Resp SpO2 Height Weight  07/24/16 2330 (!) 120/101 - - - (!) 21 - - -  07/24/16 2300 (!) 132/104 - - 83  (!) 21 94 % - -  07/24/16 2230 123/79 - - 77 20 91 % - -  07/24/16 2215 (!) 125/92 - - 73 (!) 21 91 % - -  07/24/16 2200 128/87 - - 77 (!) 22 (!) 89 % - -  07/24/16 2145 112/70 - - 70 20 94 % - -  07/24/16 2136 114/80 - - 69 18 - - -  07/24/16 2115 127/82 - - 67 19 90 % - -  07/24/16 2100 117/84 - - 67 20 93 % - -  07/24/16 2031 118/71 - - - (!) 24 - - -  07/24/16 2015 - 99.7 F (37.6 C) Rectal - - - - -  07/24/16 2000 (!) 188/168 - - - - - - -  07/24/16 1949 (!) 190/125 - - 60 20 100 % - -  07/24/16 1948 - - - - - - 5\' 10"  (1.778 m) 86.2 kg (190 lb)    1. General:  in No Acute distress 2. Psychological: Alert and   Oriented slurred speech 3. Head/ENT:     Dry Mucous Membranes                          Head Non traumatic, neck supple                            Poor Dentition 4. SKIN: normal  Skin turgor,  Skin clean Dry and intact no rash 5. Heart: Regular rate and rhythm slight  Murmur, Rub or gallop 6. Lungs:  no wheezes some crackles   7. Abdomen: Soft, non-tender, Non distended 8. Lower extremities: no clubbing, cyanosis, or edema 9. Neurologically   strength 4 out of 5 right leg noted otherwise  cranial nerves II through XII intact 10. MSK: Normal range of motion   body mass index is 27.26 kg/m.  Labs on Admission:   Labs on Admission: I have personally reviewed following labs and imaging studies  CBC:  Recent Labs Lab 07/19/16 1016 07/20/16 0353 07/21/16 0512 07/22/16 0429 07/23/16 0546 07/24/16 2008 07/24/16 2018  WBC 27.8* 27.4* 26.4* 22.3* 18.1* 18.6*  --   NEUTROABS 24.4*  --   --   --  14.6* 15.7*  --   HGB 10.3* 9.8* 10.0* 9.7* 9.5* 9.6* 9.9*  HCT 31.1* 29.5* 31.1* 30.8* 29.6* 30.0* 29.0*  MCV 94.5 94.6 93.7 94.2 94.0 95.5  --   PLT 72* 71* 74* 77* 68* 72*  --    Basic Metabolic Panel:  Recent Labs Lab 07/20/16 0353 07/21/16 0512 07/22/16 0429 07/23/16 0130 07/23/16 0546 07/24/16 2008 07/24/16 2018  NA 139 134*  --  134* 133* 136 137  K  4.0 3.7  --  3.6 3.5 4.2 4.1  CL 106 102  --  102 102 104 105  CO2 25 23  --  25 26 24   --   GLUCOSE 108* 104*  --  113* 131* 124* 119*  BUN 42* 43*  --  41* 40* 36* 36*  CREATININE 2.27* 2.31* 2.31* 2.17* 1.97* 1.66* 1.60*  CALCIUM 8.4* 8.2*  --  8.1* 8.0* 8.0*  --   MG  --   --   --  2.2  --  2.0  --    GFR: Estimated Creatinine Clearance: 46.3 mL/min (A) (by C-G formula based on SCr of 1.6 mg/dL (H)). Liver Function Tests:  Recent Labs Lab 07/19/16 1016 07/21/16 0512 07/24/16 2008  AST 237* 93* 44*  ALT 358* 232* 94*  ALKPHOS 81 67 46  BILITOT 0.9 1.3* 1.0  PROT 6.1* 5.8* 5.2*  ALBUMIN 3.3* 2.8* 2.4*   No results for input(s): LIPASE, AMYLASE in the last 168 hours.  Recent Labs Lab 07/24/16 2249  AMMONIA 10   Coagulation Profile:  Recent Labs Lab 07/19/16 1608  INR 1.55   Cardiac Enzymes:  Recent Labs Lab 07/23/16 0130 07/23/16 0704 07/23/16 1307  TROPONINI 0.13* 0.11* 0.10*   BNP (last 3 results) No results for input(s): PROBNP in the last 8760 hours. HbA1C: No results for input(s): HGBA1C in the last 72 hours. CBG: No results for input(s): GLUCAP in the last 168 hours. Lipid Profile: No results for input(s): CHOL, HDL, LDLCALC, TRIG, CHOLHDL, LDLDIRECT in the last 72 hours. Thyroid Function Tests: No results for input(s): TSH, T4TOTAL, FREET4, T3FREE, THYROIDAB in the last 72 hours. Anemia Panel: No results for input(s): VITAMINB12, FOLATE, FERRITIN, TIBC, IRON, RETICCTPCT in the last 72 hours. Urine analysis:    Component Value Date/Time   COLORURINE YELLOW 07/24/2016 2022   APPEARANCEUR HAZY (A) 07/24/2016 2022   LABSPEC 1.019 07/24/2016 2022   PHURINE 5.0 07/24/2016 2022   GLUCOSEU 150 (A) 07/24/2016 2022   HGBUR LARGE (A) 07/24/2016 2022   BILIRUBINUR NEGATIVE 07/24/2016 2022   KETONESUR NEGATIVE 07/24/2016 2022   PROTEINUR 100 (A) 07/24/2016 2022   UROBILINOGEN 0.2 01/03/2013 1119   NITRITE NEGATIVE 07/24/2016 2022   LEUKOCYTESUR  TRACE (A) 07/24/2016 2022   Sepsis Labs: @LABRCNTIP (procalcitonin:4,lacticidven:4) ) Recent Results (from the past 240 hour(s))  Blood culture (routine x 2)     Status: None   Collection Time: 07/19/16 11:56 AM  Result Value Ref Range Status   Specimen Description BLOOD LEFT ANTECUBITAL  Final   Special Requests IN PEDIATRIC BOTTLE 3.5CC  Final   Culture   Final    NO GROWTH 5 DAYS Performed at [facility], [place] [address] [place], [place] [address]    Report Status 07/24/2016 FINAL  Final  Urine culture     Status: None   Collection Time: 07/19/16 11:56 AM  Result Value Ref Range Status   Specimen Description URINE, RANDOM  Final   Special Requests NONE  Final  Culture   Final    NO GROWTH Performed at [facility], [place] [address] [place], [place] [address]    Report Status 07/20/2016 FINAL  Final  Blood culture (routine x 2)     Status: None   Collection Time: 07/19/16 12:03 PM  Result Value Ref Range Status   Specimen Description BLOOD LEFT HAND  Final   Special Requests IN PEDIATRIC BOTTLE 3CC  Final   Culture   Final    NO GROWTH 5 DAYS Performed at [facility], [place] [address], [place], [place] [address]    Report Status 07/24/2016 FINAL  Final  C difficile quick scan w PCR reflex     Status: Abnormal   Collection Time: 07/21/16  6:46 AM  Result Value Ref Range Status   C Diff antigen POSITIVE (A) NEGATIVE Final   C Diff toxin NEGATIVE NEGATIVE Final   C Diff interpretation Results are indeterminate. See PCR results.  Final  Clostridium Difficile by PCR     Status: Abnormal   Collection Time: 07/21/16  6:46 AM  Result Value Ref Range Status   Toxigenic C Difficile by pcr POSITIVE (A) NEGATIVE Final    Comment: Positive for toxigenic C. difficile with little to no toxin production. Only treat if clinical presentation suggests symptomatic illness. Performed at [facility], [place] [address] [place], [place] [address]       UA  no  evidence of UTI    Lab Results  Component Value Date   HGBA1C 5.7 (H) 06/14/2016    Estimated Creatinine Clearance: 46.3 mL/min (A) (by C-G formula based on SCr of 1.6 mg/dL (H)).  BNP (last 3 results) No results for input(s): PROBNP in the last 8760 hours.   ECG REPORT  Independently reviewed Rate: 73  Rhythm: sinus with PAC ST&T Change: No acute ischemic changes   QTC 521  Filed Weights   07/24/16 1948  Weight: 86.2 kg (190 lb)     Cultures:    Component Value Date/Time   SDES BLOOD LEFT HAND 07/19/2016 1203   SPECREQUEST IN PEDIATRIC BOTTLE 3CC 07/19/2016 1203   CULT  07/19/2016 1203    NO GROWTH 5 DAYS Performed at [facility], [place] [address], [place], [place] [address]    REPTSTATUS 07/24/2016 FINAL 07/19/2016 1203     Radiological Exams on Admission: Dg Chest Portable 1 View  Result Date: 07/24/2016 CLINICAL DATA:  66 year old male with altered mental status EXAM: PORTABLE CHEST 1 VIEW COMPARISON:  Chest radiograph dated 07/19/2016 FINDINGS: There has been interval removal of the right-sided PICC. There is stable cardiomegaly. Mechanical cardiac valve and left atrial appendage occlusion device noted. There is mild vascular congestion. There has been significant interval improvement of the previously seen right pleural effusion and right lung base airspace densities. Small amount of pleural effusion and associated atelectasis versus infiltrate remains. The left lung is clear. There is no pneumothorax. Median sternotomy wires noted. No acute osseous pathology. IMPRESSION: 1. Interval improvement of the previously seen right-sided pleural effusion and right lung base airspace densities with minimal amount of residual pleural effusion and associated right lung base atelectasis/infiltrate. 2. Stable cardiomegaly with probable mild vascular congestion. No pulmonary edema. Electronically Signed   By: Anner Crete M.D.   On: 07/24/2016 20:23    Chart has been  reviewed    Assessment/Plan  66 y.o. male with medical history significant of endocarditis,   CAD, acute and subacute CVA due to  endocarditis, mitral valve vegetation   PE/PAF/DVT/lupus anticoagulant chronic anticoagulation, PMR given steroids, chronic diastolic CHF, HTN, anemia of chronic disease, base of the tongue cancer recently diagnosed had not been treated yet. Admitted for unresponsive event in the setting of bradycardia and agonal respirations CT head showed new CVA  Present on Admission: . CVA (cerebral vascular accident) ([place]) -  - will admit based on TIA/CVA protocol, await results of MRA/MRI, will not repeat Carotid Doppler but recheck  Echo, obtain cardiac enzymes,  ECG,    . Order PT/OT evaluation. Will make sure patient is on antiplatelet agent aspirin hold Xarelto   Neurology consulted.      . Anemia of chronic disease - stable chronic will follow  . Cancer of base of tongue ([place]) we'll need to follow up with oncology at the time of discharge . Bradycardia  -  not on beta blockers or other rate control medications. Currently does not require pacing appreciate cardiology consult. Please call Dr. Einar Gip in AM  . Syncope and collapse in the setting of severe bradycardia   defer to cardiology . Chronic diastolic CHF (congestive heart failure) (HCC) currently appears to be close to euvolemic given new CVA will avoid diuresis and appreciate cardiology input continue to monitor . Coronary artery disease involving native coronary artery - cycle CE check echo.  . Sepsis ([place]) thinking meeting sepsis criteria although doubted patient does have persistent white blood cell count ever since prior admission but right now afebrile bradycardia has resolved. . Essential hypertension permissive hypertension for today . History of pulmonary embolism 10 years ago on anticoagulation xarelto on hold per neurology due to recent large CVA . Lupus anticoagulant positive-  on anticoagulation at baseline  oh hold for  now . Paroxysmal atrial fibrillation (HCC) -   )        - CHA2DS2 vas score 6: restart  anticoagulation with  xarelto  When clered by Neurology           -  Rate control:  Recent bradycardia have not required any rate control       -    . PMR (polymyalgia rheumatica) (HCC) -  - stable chronic will follow and not on steroids currently . Thrombocytopenia ([place]) - will need to follow-up with hematology oncology seems to be going persistently down since May 2017    Other plan as per orders.  DVT prophylaxis:  SCD      Code Status:  FULL CODE   as per  family   Family Communication:   Family not  at  Bedside     Disposition Plan:     likely will need placement for rehabilitation                             Would benefit from PT/OT eval prior to DC   ordered                                                Consults called: Neurology  Admission status:   inpatient       Level of care      SDU      I have spent a total of 57min on this admission   extra time was spent to discuss case with Neurology   [facility] 07/25/2016, 1:04  AM    [facility]  Pager [phone]   after 2 AM please page floor coverage PA If 7AM-7PM, please contact the day team taking care of the patient  [email or web address]  Password [email or web address]

## 2016-07-24 NOTE — ED Triage Notes (Signed)
The pt arrived by gems from home exteranlluy paced  He just left this hospital yesterday with c-diff diagnosis.  He woke from a nap  His wife reports screaming initially  Then was unresponsive.  One arrival  Pt groaning   He was given  Versed 2.5mg  on  Way here iv lt ej    Pt groans with shocks from the pacemaker

## 2016-07-24 NOTE — ED Notes (Signed)
[facility] contacted to activate Code Sepsis 

## 2016-07-24 NOTE — ED Notes (Signed)
The pts wife is requesting to speak to the edp  Dr Reather Converse notified

## 2016-07-24 NOTE — ED Notes (Signed)
The pt went to c-t then  Came back to the ed.  Talking more now wife at the bedside.   Maintaining 02 sats with nasal 02 at 3

## 2016-07-24 NOTE — ED Provider Notes (Signed)
[facility] DEPT Provider Note   CSN: [id number] Arrival date & time: 07/24/16  1941     History   Chief Complaint Chief Complaint  Patient presents with  . Bradycardia    HPI Peter Bruce is a 66 y.o. 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 Pt was discharged from [facility] yesterday and returned home with his wife. Wife states he was weak but doing well this morning. She heard the patient yelling from the bedroom and found him with agonal respirations and decreased responsiveness. Per EMS he was initially bradycardic to 30s, BP 50/palp. His BP and mental status improved with 500c IVF and on non-rebreather. Cutaneous paced via EMS. Wife reports intermittent garbled speech for several weeks.  The history is provided by the patient, medical records, the EMS personnel and the spouse.  Altered Mental Status   This is a new problem. The current episode started less than 1 hour ago. The problem has been resolved. Associated symptoms include confusion and somnolence. Risk factors include a recent illness and a recent infection. His past medical history is significant for TIA, hypertension and heart disease. His past medical history does not include diabetes, seizures, liver disease, AIDS, COPD, depression, dementia or head trauma.    Past Medical History:  Diagnosis Date  . Acute CHF (congestive heart failure) ([place]) 11/01/2015  . Acute on chronic diastolic heart failure ([place]) 09/27/2015  . Acute pulmonary embolus ([place]) 2010  . Acute respiratory failure with hypoxia ([place]) 09/09/2015  . Arthritis   . Atrial fibrillation [facility])    s/p inpatient DCCV 09/11/2015  . Atrial flutter ([place])   . CHF (congestive heart failure) ([place])   . Coronary artery disease involving native coronary artery 05/15/2015   multivessel  . Dysrhythmia    A-fib  . Essential hypertension   . Fibromyalgia   . History of  kidney stones   . Hypercholesterolemia   . Hypertension   . Lupus anticoagulant positive 07/29/2008  . Mitral regurgitation 10/30/2015  . PMR (polymyalgia rheumatica) (HCC)   . Pneumonia   . 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 S/P Maze operation for atrial fibrillation 05/29/2015   Complete bilateral atrial lesion set via median sternotomy using bipolar radiofrequency and cryothermy ablation with clipping of LA appendage  . 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 Squamous cell cancer of buccal mucosa [facility])     Patient Active Problem List   Diagnosis Date Noted  . CVA (cerebral vascular accident) ([place]) 07/25/2016  . Bradycardia 07/24/2016  . Syncope and collapse 07/24/2016  . Sepsis ([place]) 07/24/2016  . Lobar pneumonia ([employer])   . HCAP (healthcare-associated pneumonia) 07/19/2016  . AKI (acute kidney injury) ([place]) 07/19/2016  . Dehydration 07/19/2016  . Cerebrovascular accident (CVA) due to bilateral embolism of middle cerebral arteries ([place])   . Lupus erythematosus   . PMR (polymyalgia rheumatica) (HCC)   . Coronary artery disease involving coronary bypass graft of native heart without angina pectoris   . Benign essential HTN   . Tachypnea   . Leukocytosis   . Anemia of chronic disease   . Thrombocytopenia ([place])   . 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 Cerebral embolism with cerebral infarction 06/14/2016  . Streptococcal bacteremia   . Prosthetic valve endocarditis ([place])   . Septic embolism ([place])   . Hypercoagulable state ([place])   . Other streptococcal sepsis ([place]) 06/12/2016  . Sepsis, unspecified organism ([place]) 06/11/2016  . Hyponatremia 06/11/2016  . Normocytic anemia 06/11/2016  . S/P minimally invasive mitral valve repair  11/21/2015  . Essential hypertension   . Mitral regurgitation 10/30/2015  . Fatigue 10/14/2015  . Current use of long term anticoagulation 09/27/2015  . Bilateral pleural effusion 09/27/2015  . Chronic diastolic CHF (congestive heart failure) ([place]) 09/27/2015  . Respiratory failure with hypoxia ([place]) 09/09/2015  . Atrial flutter ([place])   . S/P CABG x 3 and maze procedure 05/29/2015  . Coronary artery disease involving native coronary artery 05/15/2015  . Dyslipidemia 04/14/2015  . Polymyalgia rheumatica--rheumatol [place]-2016 12/19/2013  . Obesity (BMI 30-39.9)   . BPH (benign prostatic hyperplasia) 08/30/2011  . Paroxysmal atrial fibrillation ([place]) 08/30/2011  . Chronic pain 08/30/2011  . Anxiety 04/21/2011  . History of pulmonary embolism 10/02/2008  . Lupus anticoagulant positive 07/29/2008    Past Surgical History:  Procedure Laterality Date  . APPENDECTOMY    . arm surgery  Right    wrist and elbow  . CARDIAC CATHETERIZATION N/A 05/15/2015   Procedure: Left Heart Cath and Coronary Angiography;  Surgeon: Peter M Martinique, MD;  Location: [place] CV LAB;  Service: Cardiovascular;  Laterality: N/A;  . CARDIAC CATHETERIZATION N/A 11/17/2015   Procedure: Right/Left Heart Cath and Coronary/Graft Angiography;  Surgeon: Adrian Prows, MD;  Location: [place] CV LAB;  Service: Cardiovascular;  Laterality: N/A;  . CARDIOVERSION N/A 09/11/2015   Procedure: CARDIOVERSION;  Surgeon: Lelon Perla, MD;  Location: [facility] ENDOSCOPY;  Service: Cardiovascular;  Laterality: N/A;  . CHEST TUBE INSERTION  11/21/2015   Procedure: LEFT CHEST TUBE INSERTION;  Surgeon: Rexene Alberts, MD;  Location: [place];  Service: Open Heart Surgery;;  . CLIPPING OF ATRIAL APPENDAGE  05/29/2015   Procedure: CLIPPING OF LEFT ATRIAL APPENDAGE;  Surgeon: Rexene Alberts, MD;  Location: [place];  Service: Open Heart Surgery;;  45 AtriClip PRO 145  . CORONARY ARTERY BYPASS GRAFT N/A 05/29/2015   Procedure: CORONARY ARTERY BYPASS  GRAFTING (CABG), ON PUMP, TIMES THREE, USING LEFT INTERNAL MAMMARY ARTERY, BILATERAL GREATER SAPHENOUS VEINS HARVESTED ENDOSCOPICALLY;  Surgeon: Rexene Alberts, MD;  Location: [place];  Service: Open Heart Surgery;  Laterality: N/A;  . FRACTURE SURGERY     right wrist  . HERNIA REPAIR    . MAZE N/A 05/29/2015   Procedure: MAZE;  Surgeon: Rexene Alberts, MD;  Location: [place];  Service: Open Heart Surgery;  Laterality: N/A;  Complete Bi-Atrial Lesion set with Ablation and Cryothermy  . MITRAL VALVE REPAIR Right 11/21/2015   Procedure: MINIMALLY INVASIVE REOPERATION FOR MITRAL VALVE REPAIR;  Surgeon: Rexene Alberts, MD;  Location: [place];  Service: Open Heart Surgery;  Laterality: Right;  . PERIPHERAL VASCULAR CATHETERIZATION Bilateral 11/17/2015   Procedure: Renal Angiography;  Surgeon: Adrian Prows, MD;  Location: [place] CV LAB;  Service: Cardiovascular;  Laterality: Bilateral;  . PERIPHERAL VASCULAR CATHETERIZATION N/A 11/17/2015   Procedure: Abdominal Aortogram;  Surgeon: Adrian Prows, MD;  Location: [place] CV LAB;  Service: Cardiovascular;  Laterality: N/A;  . SHOULDER SURGERY Left    clavicular fracture  . TEE WITHOUT CARDIOVERSION N/A 05/29/2015   Procedure: TRANSESOPHAGEAL ECHOCARDIOGRAM (TEE);  Surgeon: Rexene Alberts, MD;  Location: [facility];  Service: Open Heart Surgery;  Laterality: N/A;  . TEE WITHOUT CARDIOVERSION N/A 10/30/2015   Procedure: TRANSESOPHAGEAL ECHOCARDIOGRAM (TEE);  Surgeon: Adrian Prows, MD;  Location: [place];  Service: Cardiovascular;  Laterality: N/A;  . TEE WITHOUT CARDIOVERSION N/A 11/21/2015   Procedure: TRANSESOPHAGEAL ECHOCARDIOGRAM (TEE);  Surgeon: Rexene Alberts, MD;  Location: [place];  Service: Open Heart Surgery;  Laterality: N/A;       Home Medications    Prior to Admission medications   Medication Sig Start Date End Date Taking? Authorizing Provider  acetaminophen (TYLENOL) 325 MG tablet Take 2 tablets (650 mg total) by mouth every 6 (six) hours as  needed for mild pain (or Fever >/= 101). 07/23/16  Yes Mauricio Gerome Apley, MD  amoxicillin-clavulanate (AUGMENTIN) 875-125 MG tablet Take 1 tablet by mouth 2 (two) times daily. 07/23/16  Yes Mauricio Gerome Apley, MD  diazepam (VALIUM) 10 MG tablet Take 10 mg by mouth at bedtime as needed for anxiety or sleep.    Yes Historical Provider, MD  Multiple Vitamin (MULTIVITAMIN WITH MINERALS) TABS tablet Take 1 tablet by mouth daily.   Yes Historical Provider, MD  ondansetron (ZOFRAN ODT) 8 MG disintegrating tablet Take 1 tablet (8 mg total) by mouth every 8 (eight) hours as needed for nausea or vomiting. 07/16/16  Yes Carlyle Basques, MD  oxyCODONE-acetaminophen (PERCOCET) 7.5-325 MG tablet Take 1 tablet by mouth 3 (three) times daily as needed for severe pain.   Yes Historical Provider, MD  rivaroxaban (XARELTO) 20 MG TABS tablet Take 1 tablet (20 mg total) by mouth daily with supper. 06/17/16  Yes Christina P Rama, MD  tiZANidine (ZANAFLEX) 4 MG tablet Take 2-4 mg by mouth at bedtime as needed for muscle spasms.   Yes Historical Provider, MD  valsartan (DIOVAN) 160 MG tablet Take 1 tablet (160 mg total) by mouth daily. 11/19/15  Yes Adrian Prows, MD  zolpidem (AMBIEN) 10 MG tablet Take 10 mg by mouth at bedtime as needed for sleep.    Yes Historical Provider, MD    Family History Family History  Problem Relation Age of Onset  . Cancer Mother   . Other Sister     chf  . Other Sister     Rosalee Kaufman    Social History Social History  Substance Use Topics  . Smoking status: Never Smoker  . Smokeless tobacco: Never Used  . Alcohol use No     Allergies   Amitriptyline and Serotonin reuptake inhibitors (ssris)   Review of Systems Review of Systems  Constitutional: Positive for fatigue. Negative for fever.  Respiratory: Negative for shortness of breath.   Cardiovascular: Negative for chest pain.  Gastrointestinal: Negative for abdominal pain, nausea and vomiting.  Neurological: Negative  for facial asymmetry.  Psychiatric/Behavioral: Positive for confusion.  All other systems reviewed and are negative.    Physical Exam Updated Vital Signs BP (!) 137/108   Pulse 80   Temp 99.7 F (37.6 C) (Rectal)   Resp (!) 26   Ht 5\' 10"  (1.778 m)   Wt 86.2 kg   SpO2 100%   BMI 27.26 kg/m   Physical Exam  Constitutional: He appears well-developed and well-nourished. He is easily aroused.  Appears chronically ill  HENT:  Head: Normocephalic and atraumatic.  Eyes: Conjunctivae and EOM are normal. Pupils are equal, round, and reactive to light.  Neck: Normal range of motion. Neck supple.  Cardiovascular: Normal rate, regular rhythm and intact distal pulses.   Pulmonary/Chest: Effort  normal and breath sounds normal. No respiratory distress.  Abdominal: Soft. He exhibits no distension. There is no tenderness.  Musculoskeletal: Normal range of motion. He exhibits no edema.  Neurological: He is easily aroused. No cranial nerve deficit. GCS eye subscore is 3. GCS verbal subscore is 4. GCS motor subscore is 6.  Skin: Skin is warm and dry. Capillary refill takes 2 to 3 seconds. There is pallor.  Nursing note and vitals reviewed.    ED Treatments / Results  Labs (all labs ordered are listed, but only abnormal results are displayed) Labs Reviewed  CBC WITH DIFFERENTIAL/PLATELET - Abnormal; Notable for the following:       Result Value   WBC 18.6 (*)    RBC 3.14 (*)    Hemoglobin 9.6 (*)    HCT 30.0 (*)    RDW 17.8 (*)    Platelets 72 (*)    Neutro Abs 15.7 (*)    Monocytes Absolute 1.1 (*)    All other components within normal limits  COMPREHENSIVE METABOLIC PANEL - Abnormal; Notable for the following:    Glucose, Bld 124 (*)    BUN 36 (*)    Creatinine, Ser 1.66 (*)    Calcium 8.0 (*)    Total Protein 5.2 (*)    Albumin 2.4 (*)    AST 44 (*)    ALT 94 (*)    GFR calc non Af Amer 41 (*)    GFR calc Af Amer 48 (*)    All other components within normal limits    URINALYSIS, ROUTINE W REFLEX MICROSCOPIC - Abnormal; Notable for the following:    APPearance HAZY (*)    Glucose, UA 150 (*)    Hgb urine dipstick LARGE (*)    Protein, ur 100 (*)    Leukocytes, UA TRACE (*)    Bacteria, UA RARE (*)    All other components within normal limits  BRAIN NATRIURETIC PEPTIDE - Abnormal; Notable for the following:    B Natriuretic Peptide 1,931.1 (*)    All other components within normal limits  TROPONIN I - Abnormal; Notable for the following:    Troponin I 0.08 (*)    All other components within normal limits  I-STAT VENOUS BLOOD GAS, ED - Abnormal; Notable for the following:    pCO2, Ven 34.3 (*)    All other components within normal limits  I-STAT CHEM 8, ED - Abnormal; Notable for the following:    BUN 36 (*)    Creatinine, Ser 1.60 (*)    Glucose, Bld 119 (*)    Calcium, Ion 1.07 (*)    Hemoglobin 9.9 (*)    HCT 29.0 (*)    All other components within normal limits  CULTURE, BLOOD (ROUTINE X 2)  CULTURE, BLOOD (ROUTINE X 2)  MAGNESIUM  AMMONIA  TSH  I-STAT TROPOININ, ED  I-STAT CG4 LACTIC ACID, ED    EKG  EKG Interpretation  Date/Time:  Saturday July 24 2016 19:54:03 EDT Ventricular Rate:  73 PR Interval:    QRS Duration: 96 QT Interval:  472 QTC Calculation: 521 R Axis:   160 Text Interpretation:  Sinus or ectopic atrial rhythm Probable lateral infarct, age indeterminate Anteroseptal infarct, age indeterminate Prolonged QT interval Baseline wander in lead(s) V3 V4 Confirmed by ZAVITZ MD, JOSHUA [phone]) on 07/24/2016 10:41:05 PM       Radiology Ct Head Wo Contrast  Result Date: 07/24/2016 CLINICAL DATA:  Altered mental status. Patient woke up screening in then was  unresponsive. Left the hospital yesterday with diagnosis of Clostridium difficile. EXAM: CT HEAD WITHOUT CONTRAST TECHNIQUE: Contiguous axial images were obtained from the base of the skull through the vertex without intravenous contrast. COMPARISON:  MRI brain  06/12/2016.  CT head 06/11/2016. FINDINGS: Brain: Focal low-attenuation areas demonstrated in the left anterior temporal lobe and in the left inferior cerebellar hemisphere, progressing since previous study, suggesting acute ischemic process. The left temporal changes were demonstrated on the previous MRI but the cerebellar changes appear to be new since that time. There is mild mass effect in the left posterior fossa with mild displacement and effacement of the fourth ventricle. 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 Diffuse cerebral atrophy. Ventricular dilatation consistent with central atrophy. Low-attenuation in the deep white matter consistent with small vessel ischemia. No midline shift. No abnormal extra-axial fluid collections. Gray-white matter junctions are distinct. Basal cisterns are not effaced. No acute intracranial hemorrhage. Vascular: Vascular calcifications are present in the carotid siphons and vertebrobasilar arteries peer Skull: Calvarium appears intact. Sinuses/Orbits: Paranasal sinuses and mastoid air cells are clear. Other: None. IMPRESSION: 1. Focal low-attenuation in the left anterior temporal lobe consistent with progressing infarct in the distribution of the left middle cerebral artery and as previously seen on MRI. 2. New area of low-attenuation in the left inferior cerebellar hemisphere with associated mass effect is consistent with acute infarct in the distribution of the left posterior inferior cerebral artery. This was not present on prior MRI, suggesting a new interval process. 3. Chronic atrophy and small vessel ischemic changes. No acute intracranial hemorrhage. Electronically Signed   By: Lucienne Capers M.D.   On: 07/24/2016 23:51   Dg Chest Portable 1 View  Result Date: 07/24/2016 CLINICAL DATA:  66 year old male with altered mental status EXAM: PORTABLE CHEST 1 VIEW COMPARISON:  Chest radiograph dated 07/19/2016 FINDINGS: There has been interval removal of the right-sided PICC.  There is stable cardiomegaly. Mechanical cardiac valve and left atrial appendage occlusion device noted. There is mild vascular congestion. There has been significant interval improvement of the previously seen right pleural effusion and right lung base airspace densities. Small amount of pleural effusion and associated atelectasis versus infiltrate remains. The left lung is clear. There is no pneumothorax. Median sternotomy wires noted. No acute osseous pathology. IMPRESSION: 1. Interval improvement of the previously seen right-sided pleural effusion and right lung base airspace densities with minimal amount of residual pleural effusion and associated right lung base atelectasis/infiltrate. 2. Stable cardiomegaly with probable mild vascular congestion. No pulmonary edema. Electronically Signed   By: Anner Crete M.D.   On: 07/24/2016 20:23    Procedures Procedures (including critical care time) EMERGENCY DEPARTMENT  US GUIDANCE EXAM Emergency Ultrasound:  US Guidance for Needle Guidance, [place] IV placement  INDICATIONS: Difficult vascular access Linear probe used in real-time to visualize location of needle entry through skin.   PERFORMED BY: Myself IMAGES ARCHIVED?: No LIMITATIONS: None VIEWS USED: Transverse INTERPRETATION: Needle visualized within vein   Medications Ordered in ED Medications  vancomycin (VANCOCIN) IVPB 750 mg/150 ml premix (not administered)  sodium chloride 0.9 % bolus 500 mL (0 mLs Intravenous Stopped 07/24/16 2046)  ceFEPIme (MAXIPIME) 1 g in dextrose 5 % 50 mL IVPB (0 g Intravenous Stopped 07/24/16 2107)  vancomycin (VANCOCIN) 1,500 mg in sodium chloride 0.9 % 500 mL IVPB (0 mg Intravenous Stopped 07/24/16 2330)     Initial Impression / Assessment and Plan / ED Course  I have reviewed the triage vital signs and the nursing notes.  Pertinent labs & imaging results that were available during my care of the patient were reviewed by me and considered in my medical  decision making (see chart for details).    66 y.o. male presents with hypotension, bradycardia and AMS. Concern for sepsis vs cardiac etiology. On arrival, taken off pacing, pt and maintained HR and BP w/o intervention. Code sepsis initiated on arrival for recent admission for HAP and endocarditis. Blood cultures obtained. Given empiric Vanc and Cefepime for HAP.  - Leukocytosis c/w level prior to discharge. Lab work otherwise inconsistent with infection or hypoxic respiratory failure. - lactic acid wnl, VBG unremarkable.  - CXR improved from prior - CT head concerning for acute infarct left inferior cerebellar hemisphere. Neurology consulted, unclear timeline of symptoms. Infarct may have occurred during previous hospitalization. Will obtain MRI to further evaluate.  Patient admitted to Hospitalist for further care.   Discussed with my attending physician, Dr Reather Converse  Final Clinical Impressions(s) / ED Diagnoses   Final diagnoses:  Bradycardia  Transient alteration of awareness    New Prescriptions New Prescriptions   No medications on file     Monico Blitz, MD 07/25/16 0150    Elnora Morrison, MD 07/25/16 1731

## 2016-07-24 NOTE — Progress Notes (Addendum)
Pharmacy Antibiotic Note  Peter Bruce is a 66 y.o. male who recently was discharged on 3/16 after being treated for HCAP and completing course of antibiotics for Viridans Strep endocarditis. The patient re-presented on 3/18 with ICD shocks and SIRS. Pharmacy has been consulted to start Vancomycin along with Cefepime per MD for r/o sepsis.   Code sepsis called at 2022 - antibiotics delivered to bedside at 2040. RN instructed to give Cefepime 1st, then Vancomycin.   Plan: 1. Vancomycin 1500 mg IV x 1 followed by 750 mg IV every 12 hours 2. Cefepime 1g x 1 ordered by MD - will f/u additional orders 3. Will continue to follow renal function, culture results, LOT, and antibiotic de-escalation plans   Height: 5\' 10"  (177.8 cm) Weight: 190 lb (86.2 kg) IBW/kg (Calculated) : 73  No data recorded.   Recent Labs Lab 07/19/16 1016 07/19/16 1026 07/20/16 0353 07/21/16 0512 07/22/16 0429 07/23/16 0130 07/23/16 0546 07/24/16 2018 07/24/16 2020  WBC 27.8*  --  27.4* 26.4* 22.3*  --  18.1*  --   --   CREATININE 2.32*  --  2.27* 2.31* 2.31* 2.17* 1.97* 1.60*  --   LATICACIDVEN  --  2.09*  --   --   --   --   --   --  1.35    Estimated Creatinine Clearance: 46.3 mL/min (A) (by C-G formula based on SCr of 1.6 mg/dL (H)).    Allergies  Allergen Reactions  . Amitriptyline Shortness Of Breath  . Serotonin Reuptake Inhibitors (Ssris) Nausea And Vomiting    Antimicrobials this admission: Vanc 3/17 >> Cefepime 3/17 x 1  Dose adjustments this admission: n/a  Microbiology results: 3/17 BCx >>  Thank you for allowing pharmacy to be a part of this patient's care.  Alycia Rossetti, PharmD, BCPS Clinical Pharmacist Pager: [phone] 07/24/2016 8:46 PM

## 2016-07-24 NOTE — ED Notes (Signed)
nsr the pts heart rate is sl irregular with pacs

## 2016-07-25 ENCOUNTER — Inpatient Hospital Stay (HOSPITAL_COMMUNITY): Payer: Medicare Other

## 2016-07-25 DIAGNOSIS — T826XXA Infection and inflammatory reaction due to cardiac valve prosthesis, initial encounter: Secondary | ICD-10-CM | POA: Diagnosis not present

## 2016-07-25 DIAGNOSIS — Z9114 Patient's other noncompliance with medication regimen: Secondary | ICD-10-CM | POA: Diagnosis not present

## 2016-07-25 DIAGNOSIS — Z8679 Personal history of other diseases of the circulatory system: Secondary | ICD-10-CM

## 2016-07-25 DIAGNOSIS — I63442 Cerebral infarction due to embolism of left cerebellar artery: Secondary | ICD-10-CM | POA: Diagnosis not present

## 2016-07-25 DIAGNOSIS — I483 Typical atrial flutter: Secondary | ICD-10-CM

## 2016-07-25 DIAGNOSIS — I340 Nonrheumatic mitral (valve) insufficiency: Secondary | ICD-10-CM | POA: Diagnosis not present

## 2016-07-25 DIAGNOSIS — I5032 Chronic diastolic (congestive) heart failure: Secondary | ICD-10-CM | POA: Diagnosis not present

## 2016-07-25 DIAGNOSIS — Y712 Prosthetic and other implants, materials and accessory cardiovascular devices associated with adverse incidents: Secondary | ICD-10-CM | POA: Diagnosis not present

## 2016-07-25 DIAGNOSIS — B9689 Other specified bacterial agents as the cause of diseases classified elsewhere: Secondary | ICD-10-CM | POA: Diagnosis not present

## 2016-07-25 DIAGNOSIS — Z5189 Encounter for other specified aftercare: Secondary | ICD-10-CM | POA: Diagnosis not present

## 2016-07-25 DIAGNOSIS — G464 Cerebellar stroke syndrome: Secondary | ICD-10-CM | POA: Diagnosis not present

## 2016-07-25 DIAGNOSIS — Y831 Surgical operation with implant of artificial internal device as the cause of abnormal reaction of the patient, or of later complication, without mention of misadventure at the time of the procedure: Secondary | ICD-10-CM | POA: Diagnosis present

## 2016-07-25 DIAGNOSIS — I639 Cerebral infarction, unspecified: Secondary | ICD-10-CM

## 2016-07-25 DIAGNOSIS — R001 Bradycardia, unspecified: Secondary | ICD-10-CM

## 2016-07-25 DIAGNOSIS — Z820 Family history of epilepsy and other diseases of the nervous system: Secondary | ICD-10-CM

## 2016-07-25 DIAGNOSIS — D72829 Elevated white blood cell count, unspecified: Secondary | ICD-10-CM | POA: Diagnosis not present

## 2016-07-25 DIAGNOSIS — I4891 Unspecified atrial fibrillation: Secondary | ICD-10-CM

## 2016-07-25 DIAGNOSIS — I6622 Occlusion and stenosis of left posterior cerebral artery: Secondary | ICD-10-CM | POA: Diagnosis not present

## 2016-07-25 DIAGNOSIS — Z951 Presence of aortocoronary bypass graft: Secondary | ICD-10-CM

## 2016-07-25 DIAGNOSIS — Y92009 Unspecified place in unspecified non-institutional (private) residence as the place of occurrence of the external cause: Secondary | ICD-10-CM | POA: Diagnosis not present

## 2016-07-25 DIAGNOSIS — I110 Hypertensive heart disease with heart failure: Secondary | ICD-10-CM | POA: Diagnosis not present

## 2016-07-25 DIAGNOSIS — I2510 Atherosclerotic heart disease of native coronary artery without angina pectoris: Secondary | ICD-10-CM

## 2016-07-25 DIAGNOSIS — A0472 Enterocolitis due to Clostridium difficile, not specified as recurrent: Secondary | ICD-10-CM | POA: Diagnosis not present

## 2016-07-25 DIAGNOSIS — R2681 Unsteadiness on feet: Secondary | ICD-10-CM | POA: Diagnosis not present

## 2016-07-25 DIAGNOSIS — R221 Localized swelling, mass and lump, neck: Secondary | ICD-10-CM | POA: Diagnosis not present

## 2016-07-25 DIAGNOSIS — Z8249 Family history of ischemic heart disease and other diseases of the circulatory system: Secondary | ICD-10-CM | POA: Diagnosis not present

## 2016-07-25 DIAGNOSIS — I69398 Other sequelae of cerebral infarction: Secondary | ICD-10-CM | POA: Diagnosis not present

## 2016-07-25 DIAGNOSIS — I339 Acute and subacute endocarditis, unspecified: Secondary | ICD-10-CM | POA: Diagnosis not present

## 2016-07-25 DIAGNOSIS — T826XXD Infection and inflammatory reaction due to cardiac valve prosthesis, subsequent encounter: Secondary | ICD-10-CM | POA: Diagnosis not present

## 2016-07-25 DIAGNOSIS — I058 Other rheumatic mitral valve diseases: Secondary | ICD-10-CM | POA: Diagnosis not present

## 2016-07-25 DIAGNOSIS — I330 Acute and subacute infective endocarditis: Secondary | ICD-10-CM | POA: Diagnosis not present

## 2016-07-25 DIAGNOSIS — Z95828 Presence of other vascular implants and grafts: Secondary | ICD-10-CM | POA: Diagnosis not present

## 2016-07-25 DIAGNOSIS — Z8619 Personal history of other infectious and parasitic diseases: Secondary | ICD-10-CM

## 2016-07-25 DIAGNOSIS — I471 Supraventricular tachycardia: Secondary | ICD-10-CM | POA: Diagnosis not present

## 2016-07-25 DIAGNOSIS — Z8673 Personal history of transient ischemic attack (TIA), and cerebral infarction without residual deficits: Secondary | ICD-10-CM | POA: Diagnosis not present

## 2016-07-25 DIAGNOSIS — E778 Other disorders of glycoprotein metabolism: Secondary | ICD-10-CM | POA: Diagnosis present

## 2016-07-25 DIAGNOSIS — Z888 Allergy status to other drugs, medicaments and biological substances status: Secondary | ICD-10-CM | POA: Diagnosis not present

## 2016-07-25 DIAGNOSIS — Z8701 Personal history of pneumonia (recurrent): Secondary | ICD-10-CM | POA: Diagnosis not present

## 2016-07-25 DIAGNOSIS — D638 Anemia in other chronic diseases classified elsewhere: Secondary | ICD-10-CM | POA: Diagnosis present

## 2016-07-25 DIAGNOSIS — R4182 Altered mental status, unspecified: Secondary | ICD-10-CM | POA: Diagnosis not present

## 2016-07-25 DIAGNOSIS — D696 Thrombocytopenia, unspecified: Secondary | ICD-10-CM | POA: Diagnosis not present

## 2016-07-25 DIAGNOSIS — M353 Polymyalgia rheumatica: Secondary | ICD-10-CM | POA: Diagnosis present

## 2016-07-25 DIAGNOSIS — I38 Endocarditis, valve unspecified: Secondary | ICD-10-CM | POA: Diagnosis not present

## 2016-07-25 DIAGNOSIS — Z809 Family history of malignant neoplasm, unspecified: Secondary | ICD-10-CM | POA: Diagnosis not present

## 2016-07-25 DIAGNOSIS — Z221 Carrier of other intestinal infectious diseases: Secondary | ICD-10-CM | POA: Diagnosis not present

## 2016-07-25 DIAGNOSIS — Z7901 Long term (current) use of anticoagulants: Secondary | ICD-10-CM

## 2016-07-25 DIAGNOSIS — R278 Other lack of coordination: Secondary | ICD-10-CM | POA: Diagnosis not present

## 2016-07-25 DIAGNOSIS — R404 Transient alteration of awareness: Secondary | ICD-10-CM | POA: Diagnosis not present

## 2016-07-25 DIAGNOSIS — E663 Overweight: Secondary | ICD-10-CM | POA: Diagnosis present

## 2016-07-25 DIAGNOSIS — C01 Malignant neoplasm of base of tongue: Secondary | ICD-10-CM

## 2016-07-25 DIAGNOSIS — Z79899 Other long term (current) drug therapy: Secondary | ICD-10-CM | POA: Diagnosis not present

## 2016-07-25 DIAGNOSIS — I6349 Cerebral infarction due to embolism of other cerebral artery: Secondary | ICD-10-CM

## 2016-07-25 DIAGNOSIS — D6859 Other primary thrombophilia: Secondary | ICD-10-CM | POA: Diagnosis not present

## 2016-07-25 DIAGNOSIS — E785 Hyperlipidemia, unspecified: Secondary | ICD-10-CM | POA: Diagnosis present

## 2016-07-25 DIAGNOSIS — Z86711 Personal history of pulmonary embolism: Secondary | ICD-10-CM | POA: Diagnosis not present

## 2016-07-25 DIAGNOSIS — G936 Cerebral edema: Secondary | ICD-10-CM | POA: Diagnosis not present

## 2016-07-25 DIAGNOSIS — I481 Persistent atrial fibrillation: Secondary | ICD-10-CM | POA: Diagnosis not present

## 2016-07-25 DIAGNOSIS — B954 Other streptococcus as the cause of diseases classified elsewhere: Secondary | ICD-10-CM | POA: Diagnosis not present

## 2016-07-25 DIAGNOSIS — I480 Paroxysmal atrial fibrillation: Secondary | ICD-10-CM | POA: Diagnosis not present

## 2016-07-25 DIAGNOSIS — I6359 Cerebral infarction due to unspecified occlusion or stenosis of other cerebral artery: Secondary | ICD-10-CM

## 2016-07-25 DIAGNOSIS — I6340 Cerebral infarction due to embolism of unspecified cerebral artery: Secondary | ICD-10-CM | POA: Diagnosis not present

## 2016-07-25 DIAGNOSIS — M6281 Muscle weakness (generalized): Secondary | ICD-10-CM | POA: Diagnosis not present

## 2016-07-25 LAB — COMPREHENSIVE METABOLIC PANEL
ALBUMIN: 2.5 g/dL — AB (ref 3.5–5.0)
ALK PHOS: 47 U/L (ref 38–126)
ALT: 84 U/L — ABNORMAL HIGH (ref 17–63)
ALT: 84 U/L — AB (ref 17–63)
ANION GAP: 11 (ref 5–15)
ANION GAP: 8 (ref 5–15)
AST: 48 U/L — ABNORMAL HIGH (ref 15–41)
AST: 44 U/L — ABNORMAL HIGH (ref 15–41)
Albumin: 2.5 g/dL — ABNORMAL LOW (ref 3.5–5.0)
Alkaline Phosphatase: 52 U/L (ref 38–126)
BILIRUBIN TOTAL: 0.9 mg/dL (ref 0.3–1.2)
BUN: 33 mg/dL — AB (ref 6–20)
BUN: 29 mg/dL — ABNORMAL HIGH (ref 6–20)
CALCIUM: 8.2 mg/dL — AB (ref 8.9–10.3)
CALCIUM: 8.2 mg/dL — AB (ref 8.9–10.3)
CHLORIDE: 105 mmol/L (ref 101–111)
CO2: 20 mmol/L — AB (ref 22–32)
CO2: 21 mmol/L — AB (ref 22–32)
CREATININE: 1.51 mg/dL — AB (ref 0.61–1.24)
Chloride: 106 mmol/L (ref 101–111)
Creatinine, Ser: 1.38 mg/dL — ABNORMAL HIGH (ref 0.61–1.24)
GFR calc Af Amer: 60 mL/min — ABNORMAL LOW (ref >60)
GFR calc non Af Amer: 51 mL/min — ABNORMAL LOW (ref >60)
GFR, EST AFRICAN AMERICAN: 53 mL/min — AB (ref >60)
GFR, EST NON AFRICAN AMERICAN: 46 mL/min — AB (ref >60)
GLUCOSE: 92 mg/dL (ref 65–99)
Glucose, Bld: 109 mg/dL — ABNORMAL HIGH (ref 65–99)
Potassium: 4.4 mmol/L (ref 3.5–5.1)
Potassium: 4 mmol/L (ref 3.5–5.1)
SODIUM: 137 mmol/L (ref 135–145)
SODIUM: 134 mmol/L — AB (ref 135–145)
TOTAL PROTEIN: 5.1 g/dL — AB (ref 6.5–8.1)
Total Bilirubin: 1.1 mg/dL (ref 0.3–1.2)
Total Protein: 5.2 g/dL — ABNORMAL LOW (ref 6.5–8.1)

## 2016-07-25 LAB — CBC WITH DIFFERENTIAL/PLATELET
Basophils Absolute: 0.1 10*3/uL (ref 0.0–0.1)
Basophils Relative: 0 %
EOS ABS: 0.5 10*3/uL (ref 0.0–0.7)
EOS PCT: 3 %
HCT: 31.7 % — ABNORMAL LOW (ref 39.0–52.0)
Hemoglobin: 10.1 g/dL — ABNORMAL LOW (ref 13.0–17.0)
LYMPHS ABS: 1.3 10*3/uL (ref 0.7–4.0)
LYMPHS PCT: 6 %
MCH: 30.3 pg (ref 26.0–34.0)
MCHC: 31.9 g/dL (ref 30.0–36.0)
MCV: 95.2 fL (ref 78.0–100.0)
MONO ABS: 1.7 10*3/uL — AB (ref 0.1–1.0)
MONOS PCT: 8 %
Neutro Abs: 17.5 10*3/uL — ABNORMAL HIGH (ref 1.7–7.7)
Neutrophils Relative %: 83 %
PLATELETS: 95 10*3/uL — AB (ref 150–400)
RBC: 3.33 MIL/uL — ABNORMAL LOW (ref 4.22–5.81)
RDW: 17.9 % — AB (ref 11.5–15.5)
WBC: 21.1 10*3/uL — ABNORMAL HIGH (ref 4.0–10.5)

## 2016-07-25 LAB — TROPONIN I
TROPONIN I: 0.11 ng/mL — AB (ref <0.03)
Troponin I: 0.08 ng/mL (ref <0.03)
Troponin I: 0.08 ng/mL (ref <0.03)
Troponin I: 0.07 ng/mL (ref <0.03)

## 2016-07-25 LAB — PROTIME-INR
INR: 1.5
Prothrombin Time: 18.3 seconds — ABNORMAL HIGH (ref 11.4–15.2)

## 2016-07-25 LAB — MAGNESIUM: MAGNESIUM: 2 mg/dL (ref 1.7–2.4)

## 2016-07-25 LAB — CBC
HCT: 30.9 % — ABNORMAL LOW (ref 39.0–52.0)
HEMOGLOBIN: 9.8 g/dL — AB (ref 13.0–17.0)
MCH: 30.2 pg (ref 26.0–34.0)
MCHC: 31.7 g/dL (ref 30.0–36.0)
MCV: 95.1 fL (ref 78.0–100.0)
Platelets: 96 10*3/uL — ABNORMAL LOW (ref 150–400)
RBC: 3.25 MIL/uL — ABNORMAL LOW (ref 4.22–5.81)
RDW: 17.5 % — AB (ref 11.5–15.5)
WBC: 19.3 10*3/uL — ABNORMAL HIGH (ref 4.0–10.5)

## 2016-07-25 LAB — PROCALCITONIN: PROCALCITONIN: 0.25 ng/mL

## 2016-07-25 LAB — MRSA PCR SCREENING: MRSA by PCR: NEGATIVE

## 2016-07-25 LAB — APTT: APTT: 29 s (ref 24–36)

## 2016-07-25 MED ORDER — STROKE: EARLY STAGES OF RECOVERY BOOK
Freq: Once | Status: DC
  Filled 2016-07-25: qty 1

## 2016-07-25 MED ORDER — ORAL CARE MOUTH RINSE
15.0000 mL | Freq: Two times a day (BID) | OROMUCOSAL | Status: DC
  Administered 2016-07-25 – 2016-07-28 (×6): 15 mL via OROMUCOSAL

## 2016-07-25 MED ORDER — MAGNESIUM SULFATE 2 GM/50ML IV SOLN
2.0000 g | Freq: Once | INTRAVENOUS | Status: AC
  Administered 2016-07-25: 2 g via INTRAVENOUS
  Filled 2016-07-25: qty 50

## 2016-07-25 MED ORDER — SODIUM CHLORIDE 0.9 % IV SOLN
INTRAVENOUS | Status: DC
  Administered 2016-07-25 (×2): via INTRAVENOUS

## 2016-07-25 MED ORDER — ACETAMINOPHEN 325 MG PO TABS
650.0000 mg | ORAL_TABLET | ORAL | Status: DC | PRN
  Administered 2016-07-25: 650 mg via ORAL
  Filled 2016-07-25 (×2): qty 2

## 2016-07-25 MED ORDER — ACETAMINOPHEN 160 MG/5ML PO SOLN: 650.0000 mg | ORAL | Status: DC | PRN

## 2016-07-25 MED ORDER — DEXTROSE 5 % IV SOLN
1.0000 g | Freq: Two times a day (BID) | INTRAVENOUS | Status: DC
  Administered 2016-07-25: 1 g via INTRAVENOUS
  Filled 2016-07-25 (×2): qty 1

## 2016-07-25 MED ORDER — ASPIRIN 325 MG PO TABS
325.0000 mg | ORAL_TABLET | Freq: Every day | ORAL | Status: DC
  Administered 2016-07-25 – 2016-07-29 (×5): 325 mg via ORAL
  Filled 2016-07-25 (×5): qty 1

## 2016-07-25 MED ORDER — ACETAMINOPHEN 650 MG RE SUPP: 650.0000 mg | RECTAL | Status: DC | PRN

## 2016-07-25 MED ORDER — ASPIRIN 300 MG RE SUPP: 300.0000 mg | Freq: Every day | RECTAL | Status: DC

## 2016-07-25 NOTE — Progress Notes (Signed)
RN spoke with Dr. Allyson Sabal, labs ordered, patient is to be transferred to ICU. Dr.McLean also returned page and will be on board with seeing patient. Bed request has been put in, waiting on reply.

## 2016-07-25 NOTE — Progress Notes (Signed)
PT Cancellation Note  Patient Details Name: Peter Bruce MRN: [id number] DOB: September 03, 1950   Cancelled Treatment:    Reason Eval/Treat Not Completed: Medical issues which prohibited therapy.  Events of this am noted.  Patient transferred to ICU.  Will hold PT today.  Will return at later date for PT evaluation when appropriate for patient.   Despina Pole 07/25/2016, 11:07 AM Carita Pian. Sanjuana Kava, [place] Pager [phone]

## 2016-07-25 NOTE — Evaluation (Signed)
Clinical/Bedside Swallow Evaluation Patient Details  Name: Peter Bruce MRN: [id number] Date of Birth: [birth year]  Today's Date: 07/25/2016 Time: SLP Start Time (ACUTE ONLY): 4315 SLP Stop Time (ACUTE ONLY): 1208 SLP Time Calculation (min) (ACUTE ONLY): 20 min  Past Medical History:  Past Medical History:  Diagnosis Date  . Acute CHF (congestive heart failure) ([place]) 11/01/2015  . Acute on chronic diastolic heart failure ([place]) 09/27/2015  . Acute pulmonary embolus ([place]) 2010  . Acute respiratory failure with hypoxia ([place]) 09/09/2015  . Arthritis   . Atrial fibrillation [facility])    s/p inpatient DCCV 09/11/2015  . Atrial flutter ([place])   . CHF (congestive heart failure) ([place])   . Coronary artery disease involving native coronary artery 05/15/2015   multivessel  . Dysrhythmia    A-fib  . Essential hypertension   . Fibromyalgia   . History of kidney stones   . Hypercholesterolemia   . Hypertension   . Lupus anticoagulant positive 07/29/2008  . Mitral regurgitation 10/30/2015  . PMR (polymyalgia rheumatica) (HCC)   . Pneumonia   . 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 S/P Maze operation for atrial fibrillation 05/29/2015   Complete bilateral atrial lesion set via median sternotomy using bipolar radiofrequency and cryothermy ablation with clipping of LA appendage  . 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 APPENDECTOMY    . arm surgery  Right    wrist and elbow  . CARDIAC CATHETERIZATION N/A 05/15/2015   Procedure: Left Heart Cath and Coronary Angiography;  Surgeon: Peter M Martinique, MD;  Location: [place] CV LAB;  Service:  Cardiovascular;  Laterality: N/A;  . CARDIAC CATHETERIZATION N/A 11/17/2015   Procedure: Right/Left Heart Cath and Coronary/Graft Angiography;  Surgeon: Adrian Prows, MD;  Location: [place] [facility] LAB;  Service: Cardiovascular;  Laterality: N/A;  . CARDIOVERSION N/A 09/11/2015   Procedure: CARDIOVERSION;  Surgeon: Lelon Perla, MD;  Location: [facility];  Service: Cardiovascular;  Laterality: N/A;  . CHEST TUBE INSERTION  11/21/2015   Procedure: LEFT CHEST TUBE INSERTION;  Surgeon: Rexene Alberts, MD;  Location: [place];  Service: Open Heart Surgery;;  . CLIPPING OF ATRIAL APPENDAGE  05/29/2015   Procedure: CLIPPING OF LEFT ATRIAL APPENDAGE;  Surgeon: Rexene Alberts, MD;  Location: [place];  Service: Open Heart Surgery;;  45 AtriClip PRO 145  . CORONARY ARTERY BYPASS GRAFT N/A 05/29/2015   Procedure: CORONARY ARTERY BYPASS GRAFTING (CABG), ON PUMP, TIMES THREE, USING LEFT INTERNAL MAMMARY ARTERY, BILATERAL GREATER SAPHENOUS VEINS HARVESTED ENDOSCOPICALLY;  Surgeon: Rexene Alberts, MD;  Location: [place];  Service: Open Heart Surgery;  Laterality: N/A;  . FRACTURE SURGERY     right wrist  . HERNIA REPAIR    . MAZE N/A 05/29/2015   Procedure: MAZE;  Surgeon: Rexene Alberts, MD;  Location: [place];  Service: Open Heart Surgery;  Laterality: N/A;  Complete Bi-Atrial Lesion set with Ablation and Cryothermy  . MITRAL VALVE REPAIR Right 11/21/2015   Procedure: MINIMALLY INVASIVE REOPERATION FOR MITRAL VALVE REPAIR;  Surgeon: Rexene Alberts, MD;  Location: [place];  Service: Open Heart Surgery;  Laterality: Right;  . PERIPHERAL VASCULAR CATHETERIZATION Bilateral 11/17/2015   Procedure: Renal Angiography;  Surgeon: Adrian Prows, MD;  Location: [place] CV LAB;  Service: Cardiovascular;  Laterality: Bilateral;  . PERIPHERAL VASCULAR CATHETERIZATION N/A 11/17/2015   Procedure: Abdominal Aortogram;  Surgeon: Adrian Prows, MD;  Location: [place] CV LAB;  Service: Cardiovascular;  Laterality: N/A;  . SHOULDER SURGERY  Left    clavicular fracture  . TEE WITHOUT CARDIOVERSION N/A 05/29/2015   Procedure: TRANSESOPHAGEAL ECHOCARDIOGRAM (TEE);  Surgeon: Rexene Alberts, MD;  Location: [place];  Service: Open Heart Surgery;  Laterality: N/A;  . TEE WITHOUT CARDIOVERSION N/A 10/30/2015   Procedure: TRANSESOPHAGEAL ECHOCARDIOGRAM (TEE);  Surgeon: Adrian Prows, MD;  Location: [place];  Service: Cardiovascular;  Laterality: N/A;  . 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 Presented with episode of unresponsiveness. HEad CT shows new CVA (Occlusion of the left posterior inferior cerebellar artery with associated acute infarct of the inferior left cerebellar hemisphere, Scattered areas of DWI abnormality in the left temporal lobe, right temporal lobe and right parietal lobe are favored to be areas of subacute ischemia). Recently discharged from [place] where he was admitted with generalized weakness and found to have NCAP with acute hypoxia nad respiratory failure. Admission CXR: Interval improvement of the previously seen right-sided pleural effusion and right lung base airspace densities with minimal amount of residual pleural effusion and associated right lung base atelectasis/infiltrate. Note that bedside swallow evaluation complete during 06/2016 admission with noted s/s of aspiration, recommendations for dysphagia 3, thin liquids and MBS however at the time patient declined MBS.    Assessment / Plan /  Recommendation Clinical Impression  Patient presents with s/s of decreased airway protection, primarily with thin liquids characterized by cough/wet vocal quality post swallow. Airway protection appears adequate with pureed solids. Patient with recently diagnosed lingual cancer and right sided PNA. In additon, now with new CVA and AMS. Multiple risk factors for aspiration. Recommend MBS to determine least restrictive diet.  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 Presented with episode of unresponsiveness. HEad CT shows new CVA (Occlusion of the left posterior inferior cerebellar artery with associated acute infarct of the inferior left cerebellar hemisphere, Scattered areas of DWI abnormality in the left temporal lobe, right temporal lobe and right parietal lobe are favored to be areas of subacute ischemia). Recently discharged from [place] where he was admitted with generalized weakness and found to have NCAP with  acute hypoxia nad respiratory failure. Admission CXR: Interval improvement of the previously seen right-sided pleural effusion and right lung base airspace densities with minimal amount of residual pleural effusion and associated right lung base atelectasis/infiltrate. Note that bedside swallow evaluation complete during 06/2016 admission with noted s/s of aspiration,  recommendations for dysphagia 3, thin liquids and MBS however at the time patient declined MBS.  Type of Study: Bedside Swallow Evaluation Previous Swallow Assessment: see HPI Diet Prior to this Study: NPO Temperature Spikes Noted: No Respiratory Status: Nasal cannula History of Recent Intubation: No Behavior/Cognition: Lethargic/Drowsy;Cooperative;Pleasant mood Oral Cavity Assessment: Within Functional Limits Oral Care Completed by SLP: Recent completion by staff Oral Cavity - Dentition: Adequate natural dentition Vision: Functional for self-feeding Self-Feeding Abilities: Able to feed self Patient Positioning: Upright in bed Baseline Vocal Quality: Normal Volitional Cough: Strong Volitional Swallow: Able to elicit    Oral/Motor/Sensory Function Overall Oral Motor/Sensory Function: Mild impairment Facial ROM: Within Functional Limits Facial Symmetry: Within Functional Limits Facial Strength: Within Functional Limits Facial Sensation: Within Functional Limits Lingual ROM: Within Functional Limits Lingual Symmetry: Abnormal symmetry right;Suspected CN XII (hypoglossal) dysfunction Lingual Strength: Within Functional Limits Lingual Sensation: Within Functional Limits Velum: Within Functional Limits Mandible: Within Functional Limits   Ice Chips Ice chips: Within functional limits Presentation: Spoon   Thin Liquid Thin Liquid: Impaired Presentation: Cup;Self Fed Pharyngeal  Phase Impairments: Suspected delayed Swallow;Cough - Immediate;Wet Vocal Quality    Nectar Thick Nectar Thick Liquid: Not tested   Honey Thick Honey Thick Liquid: Not tested   Puree Puree: Within functional limits Presentation: Spoon   Solid   GO  Peter Mignone MA, CCC-SLP [phone]  Solid: Not tested        Peter Bruce 07/25/2016,12:11 PM

## 2016-07-25 NOTE — Consult Note (Signed)
[facility] for Infectious Disease       Reason for Consult: leukocytosis    Referring Physician: Dr. Allyson Sabal  Active Problems:   History of pulmonary embolism   Paroxysmal atrial fibrillation (HCC)   Coronary artery disease involving native coronary artery   Lupus anticoagulant positive   Atrial flutter (HCC)   Current use of long term anticoagulation   Chronic diastolic CHF (congestive heart failure) (HCC)   Essential hypertension   Cancer of base of tongue (HCC)   PMR (polymyalgia rheumatica) (HCC)   Benign essential HTN   Anemia of chronic disease   Thrombocytopenia (HCC)   Bradycardia   Syncope and collapse   Sepsis ([place])   CVA (cerebral vascular accident) ([place])   .  stroke: mapping our early stages of recovery book   Does not apply Once  . aspirin  300 mg Rectal Daily   Or  . aspirin  325 mg Oral Daily  . mouth rinse  15 mL Mouth Rinse BID    Recommendations: Observe off of antibiotics TEE Monitor blood cultures  Assessment: He has a new left acute infarct and scattered areas that are subacute ischemia in someone with afib and poor compliance with anticoagulation and recent mitral valve endocarditis with Strep viridans with a history of minimally invasive annular mitral valve repair (redo) in July 2017.  In review of the scans, history and neurology findings, this appears to be from a thrombus.   Leukocytosis - likely from acute process Colonization with C diff  Dr. Megan Salon will follow up tomorrow  Antibiotics: Vancomycin and cefepime day 2 Previous vancomycin and zosyn for HCAP x 8 days through 3/16.    HPI: Peter Bruce is a 66 y.o. male with recent native valve endocarditis as listed above completed 6 weeks of IV antibiotics who was discharge the day prior to admission with pneumonia who came in with bradycardia, hypotension,slurred speech and weakness and CT noted a left cerebellar infarct and MRIs as above.  Also with WBC of 20, no fever, no chills.   Has afib and had missed Xarelto.  CAD with recent CABG, maze procedure of mitral valve.   History of recent hospitalizations reviewed and treated for pneumonia as above. CXR personally reviewed and much improved and nearly resolved area on the right where there was an effusion and airspace disease.    Review of Systems:  Constitutional: negative for fevers, chills and anorexia Gastrointestinal: negative for diarrhea Integument/breast: negative for rash All other systems reviewed and are negative    Past Medical History:  Diagnosis Date  . Acute CHF (congestive heart failure) ([place]) 11/01/2015  . Acute on chronic diastolic heart failure ([place]) 09/27/2015  . Acute pulmonary embolus ([place]) 2010  . Acute respiratory failure with hypoxia ([place]) 09/09/2015  . Arthritis   . Atrial fibrillation [facility])    s/p inpatient DCCV 09/11/2015  . Atrial flutter ([place])   . CHF (congestive heart failure) ([place])   . Coronary artery disease involving native coronary artery 05/15/2015   multivessel  . Dysrhythmia    A-fib  . Essential hypertension   . Fibromyalgia   . History of kidney stones   . Hypercholesterolemia   . Hypertension   . Lupus anticoagulant positive 07/29/2008  . Mitral regurgitation 10/30/2015  . PMR (polymyalgia rheumatica) (HCC)   . Pneumonia   . 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  S/P Maze operation for atrial fibrillation 05/29/2015   Complete bilateral atrial lesion set via median sternotomy using bipolar radiofrequency and cryothermy ablation with clipping of LA appendage  . 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 Smoking status: Never Smoker  . Smokeless  tobacco: Never Used  . Alcohol use No    Family History  Problem Relation Age of Onset  . Cancer Mother   . Other Sister     chf  . Other Sister     Rosalee Kaufman    Allergies  Allergen Reactions  . Amitriptyline Shortness Of Breath  . Serotonin Reuptake Inhibitors ([place]) Nausea And Vomiting    Physical Exam: Constitutional: in no apparent distress  Vitals:   07/25/16 0900 07/25/16 1300  BP: (!) 150/133 (!) 160/107  Pulse: 88 92  Resp: (!) 27 (!) 22  Temp: 98.3 F (36.8 C)    EYES: anicteric ENMT:no thrush Cardiovascular: irr Respiratory: CTA B; normal respiratory effort GI: Bowel sounds are normal, soft, nt Musculoskeletal: no pedal edema noted Skin: negatives: no rash  Lab Results  Component Value Date   WBC 21.1 (H) 07/25/2016   HGB 10.1 (L) 07/25/2016   HCT 31.7 (L) 07/25/2016   MCV 95.2 07/25/2016   PLT 95 (L) 07/25/2016    Lab Results  Component Value Date   CREATININE 1.51 (H) 07/25/2016   BUN 29 (H) 07/25/2016   NA 134 (L) 07/25/2016   K 4.0 07/25/2016   CL 105 07/25/2016   CO2 21 (L) 07/25/2016    Lab Results  Component Value Date   ALT 84 (H) 07/25/2016   AST 44 (H) 07/25/2016   ALKPHOS 52 07/25/2016     Microbiology: Recent Results (from the past 240 hour(s))  Blood culture (routine x 2)     Status: None   Collection Time: 07/19/16 11:56 AM  Result Value Ref Range Status   Specimen Description BLOOD LEFT ANTECUBITAL  Final   Special Requests IN PEDIATRIC BOTTLE 3.5CC  Final   Culture   Final    NO GROWTH 5 DAYS Performed at [facility], [address] [place], [place] [address]    Report Status 07/24/2016 FINAL  Final  Urine culture     Status: None   Collection Time: 07/19/16 11:56 AM  Result Value Ref Range Status   Specimen Description URINE, RANDOM  Final   Special Requests NONE  Final   Culture   Final    NO GROWTH Performed at [facility], [place] [address], [place], [place] [address]    Report Status  07/20/2016 FINAL  Final  Blood culture (routine x 2)     Status: None   Collection Time: 07/19/16 12:03 PM  Result Value Ref Range Status   Specimen Description BLOOD LEFT HAND  Final   Special Requests IN PEDIATRIC BOTTLE 3CC  Final   Culture   Final    NO GROWTH 5 DAYS Performed at [facility], [place] [address], [place], [place] [address]    Report Status 07/24/2016 FINAL  Final  C difficile quick scan w PCR reflex     Status: Abnormal   Collection Time: 07/21/16  6:46 AM  Result Value Ref Range Status  C Diff antigen POSITIVE (A) NEGATIVE Final   C Diff toxin NEGATIVE NEGATIVE Final   C Diff interpretation Results are indeterminate. See PCR results.  Final  Clostridium Difficile by PCR     Status: Abnormal   Collection Time: 07/21/16  6:46 AM  Result Value Ref Range Status   Toxigenic C Difficile by pcr POSITIVE (A) NEGATIVE Final    Comment: Positive for toxigenic C. difficile with little to no toxin production. Only treat if clinical presentation suggests symptomatic illness. Performed at [facility], [place] [address] [place], [place] [address]   Blood Culture (routine x 2)     Status: None (Preliminary result)   Collection Time: 07/24/16  8:12 PM  Result Value Ref Range Status   Specimen Description BLOOD LEFT HAND  Final   Special Requests IN PEDIATRIC BOTTLE 1CC  Final   Culture NO GROWTH < 24 HOURS  Final   Report Status PENDING  Incomplete  Blood Culture (routine x 2)     Status: None (Preliminary result)   Collection Time: 07/24/16  8:22 PM  Result Value Ref Range Status   Specimen Description BLOOD RIGHT ANTECUBITAL  Final   Special Requests BOTTLES DRAWN AEROBIC ONLY 10CC  Final   Culture NO GROWTH < 24 HOURS  Final   Report Status PENDING  Incomplete  MRSA PCR Screening     Status: None   Collection Time: 07/25/16  3:54 AM  Result Value Ref Range Status   MRSA by PCR NEGATIVE NEGATIVE Final    Comment:        The GeneXpert MRSA Assay (FDA approved  for NASAL specimens only), is one component of a comprehensive MRSA colonization surveillance program. It is not intended to diagnose MRSA infection nor to guide or monitor treatment for MRSA infections.     Scharlene Gloss, [place] for Infectious Disease [place] [email or web address] [phone] pager  [phone] cell 07/25/2016, 3:50 PM

## 2016-07-25 NOTE — ED Notes (Signed)
To mri now.

## 2016-07-25 NOTE — ED Notes (Signed)
pts wife went home  Mri calling   wil come get him

## 2016-07-25 NOTE — Progress Notes (Signed)
  Speech Language Pathology Treatment: Dysphagia  Patient Details Name: Peter Bruce MRN: [id number] DOB: 07-Jul-1950 Today's Date: 07/25/2016 Time: 1800-1830 SLP Time Calculation (min) (ACUTE ONLY): 30 min  Assessment / Plan / Recommendation Clinical Impression  Patient seen in room for dysphagia treatment following MBS. Wife present and eager to learn results of assessment, discuss goals of care. Provided education regarding results of MBS including observed physiologic impairments, and compensatory strategies which reduced penetration/aspiration. Provided extensive education regarding patient's multiple risk factors for aspiration, impact of base of tongue cancer and potential treatments on swallow function. Discussed various options including oral diet with use of compensatory strategies, temporary or long-term alternative nutrition, or potential for combination of both should patient have difficulty maintaining adequate nutrition/hydration orally. Patient and his wife acknowledge risks of aspiration and wife appears very eager to assist patient with aggressive oral hygiene and compensatory swallow strategies. At this time recommend dys 3 diet with thin liquids via single cup sips, with use of immediate hard cough, reswallow with each bite/sip. After wife assisted pt with oral care, demonstrated use of these techniques and wife was able to take over cuing for strategies by end of session. SLP will follow up for tolerance.     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 Presented with episode of unresponsiveness. HEad CT shows new CVA (Occlusion of the left posterior inferior cerebellar artery  with associated acute infarct of the inferior left cerebellar hemisphere, Scattered areas of DWI abnormality in the left temporal lobe, right temporal lobe and right parietal lobe are favored to be areas of subacute ischemia). Recently discharged from [place] where he was admitted with generalized weakness and found to have NCAP with acute hypoxia nad respiratory failure. Admission CXR: Interval improvement of the previously seen right-sided pleural effusion and right lung base airspace densities with minimal amount of residual pleural effusion and associated right lung base atelectasis/infiltrate. Note that bedside swallow evaluation complete during 06/2016 admission with noted s/s of aspiration, recommendations for dysphagia 3, thin liquids and MBS however at the time patient declined MBS.       SLP Plan  Continue with current plan of care       Recommendations  Diet recommendations: Dysphagia 3 (mechanical soft);Thin liquid Liquids provided via: Cup Medication Administration: Whole meds with puree Supervision: Full supervision/cueing for compensatory strategies;Staff to assist with self feeding Compensations: Minimize environmental distractions;Slow rate;Small sips/bites;Hard cough after swallow;Multiple dry swallows after each bite/sip Postural Changes and/or Swallow Maneuvers: Seated upright 90 degrees                Oral Care Recommendations: Oral care before and after PO Follow up Recommendations: Outpatient SLP SLP Visit Diagnosis: Dysphagia, pharyngeal phase (R13.13) Plan: Continue with current plan of care       [place], [place] CF-SLP Speech-Language Pathologist [phone]  Aliene Altes 07/25/2016, 6:40 PM

## 2016-07-25 NOTE — Progress Notes (Signed)
Dr. Mike Craze, MD notified of high DBP 100-120s and narrow pulse pressures (1402/110-120s); no new orders received; requested to just monitor patient for now. Pt without any neurological changes from initial shift assessment; no c/o CP or SOB.  Pt does c/o cough but not noted by RN.  Will continue to monitor closely.

## 2016-07-25 NOTE — Progress Notes (Signed)
STROKE TEAM PROGRESS NOTE   HISTORY OF PRESENT ILLNESS (per record) Peter Bruce is a 66 y.o. male with a history of atrial fibrillation and recent diagnosis of endocarditis who screamed to be unresponsive. He was groaning on EMS arrival noted to be bradycardic in the 30s blood pressures in the 50s. He was given some fluids and improved. He did not come back completely to baseline and therefore CT was performed which demonstrates a fairly large cerebellar infarct.  Speaking with the patient and his wife, he did not need a walker prior to going to the hospital most recently. He has needed a walker since leaving the hospital yesterday. He has had trouble walking and slurred speech for a couple of days but his wife states that slurred speech seems to come and go and him and therefore not much was thought of it. His difficulty walking has been initiated to generalized deconditioning and weakness.  Of note, he missed his Xarelto last night due to being exhausted and tonight because he was in the emergency room.   SUBJECTIVE (INTERVAL HISTORY) His wife was at the bedside.  We reviewed the MRI and neurologic care plan together.  Patient has a somewhat flat affect and is slow to respond.  He denies HA, other pain, SOB, nausea and dizziness.  He does complain of leg swelling and scrotal swelling which makes it uncomfortable to sit   OBJECTIVE Temp:  [98.8 F (37.1 C)-99.7 F (37.6 C)] 98.8 F (37.1 C) (03/18 0400) Pulse Rate:  [30-88] 30 (03/18 0600) Cardiac Rhythm: Supraventricular tachycardia (03/18 0648) Resp:  [15-31] 31 (03/18 0600) BP: (112-190)/(69-168) 126/86 (03/18 0600) SpO2:  [89 %-100 %] 100 % (03/18 0600) Weight:  [86.2 kg (190 lb)-88.9 kg (195 lb 15.8 oz)] 88.9 kg (195 lb 15.8 oz) (03/18 0400)  CBC:   Recent Labs Lab 07/23/16 0546 07/24/16 2008 07/24/16 2018  WBC 18.1* 18.6*  --   NEUTROABS 14.6* 15.7*  --   HGB 9.5* 9.6* 9.9*  HCT 29.6* 30.0* 29.0*  MCV 94.0 95.5  --   PLT  68* 72*  --     Basic Metabolic Panel:   Recent Labs Lab 07/23/16 0130 07/23/16 0546 07/24/16 2008 07/24/16 2018  NA 134* 133* 136 137  K 3.6 3.5 4.2 4.1  CL 102 102 104 105  CO2 25 26 24   --   GLUCOSE 113* 131* 124* 119*  BUN 41* 40* 36* 36*  CREATININE 2.17* 1.97* 1.66* 1.60*  CALCIUM 8.1* 8.0* 8.0*  --   MG 2.2  --  2.0  --     Lipid Panel:     Component Value Date/Time   CHOL 170 06/14/2016 0235   TRIG 133 06/14/2016 0235   HDL 24 (L) 06/14/2016 0235   CHOLHDL 7.1 06/14/2016 0235   VLDL 27 06/14/2016 0235   LDLCALC 119 (H) 06/14/2016 0235   HgbA1c:  Lab Results  Component Value Date   HGBA1C 5.7 (H) 06/14/2016   Urine Drug Screen: No results found for: LABOPIA, COCAINSCRNUR, LABBENZ, AMPHETMU, THCU, LABBARB    IMAGING  Ct Head Wo Contrast 07/25/2016 No change since yesterday. Acute left PICA infarction with low-density in the inferior cerebellum on the left. No hemorrhage. No increased swelling. No fourth ventricular compromise.    Ct Head Wo Contrast 07/24/2016 1. Focal low-attenuation in the left anterior temporal lobe consistent with progressing infarct in the distribution of the left middle cerebral artery and as previously seen on MRI.  2. New area of  low-attenuation in the left inferior cerebellar hemisphere with associated mass effect is consistent with acute infarct in the distribution of the left posterior inferior cerebral artery. This was not present on prior MRI, suggesting a new interval process.  3. Chronic atrophy and small vessel ischemic changes. No acute intracranial hemorrhage.    Mr Peter Bruce Head Wo Contrast 07/25/2016 1. 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 No associated acute hemorrhage or mass effect.  2. Scattered areas of DWI abnormality in the left temporal lobe, right temporal lobe and right parietal lobe are favored to be areas of subacute  ischemia. The distribution is again suggestive of a central embolic process, though likely occurring at a different time from the current acute left PICA infarct.  3. 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   Dg Chest Portable 1 View 07/24/2016 1. Interval improvement of the previously seen right-sided pleural effusion and right lung base airspace densities with minimal amount of residual pleural effusion and associated right lung base atelectasis/infiltrate.  2. Stable cardiomegaly with probable mild vascular congestion. No pulmonary edema.   MRI Head W/o Contrast 06/12/2016 Multiple acute and subacute infarcts as described; the pattern favors central embolic disease. Moderate area of infarction in the lateral left temporal lobe. Mild petechial hemorrhage.   PHYSICAL EXAM Physical Exam General - Well nourished, well developed, in NAD   Cardiovascular - irregular rate and rhythm; III/VI murmur Pulmonary: CTA Abdomen: NT, ND, normal bowel sounds Extremities: No C/C; positive for edema right>left LE's  Neurological Exam Mental Status: lethargic; follows commands; flat affect Orientation:  Oriented to person, place and year; not month despite several cues patient answered "May" which is his birth month Speech:  Fluent; slight scanning dysarthria  Cranial Nerves:  PERRL; EOMI; visual fields full to threat, face grossly symmetric, hearing grossly intact; shrug symmetric and tongue midline  Motor Exam:  Tone:  Within normal limits; Strength: 5/5 in UEs; patient states bilateral legs feel heavy since he has increased edema and pain in legs.  Exam is 3/5 in LEs bilaterally  Sensory: Intact to light touch throughout  Coordination:  Intact finger to nose  Gait: Deferred  ASSESSMENT/PLAN [name] is a 66 y.o. male with history of previous strokes, atrial fibrillation, endocarditis, minimally invasive mitral valve repair, maze operation for atrial fibrillation,  CABG 3, lupus anticoagulant positive, hypertension, hyperlipidemia, coronary artery disease, congestive heart failure, and pulmonary embolus, presenting with slurred speech, difficult ambulation, bradycardia, and hypotension. He did not receive IV t-PA due to anticoagulation and recent strokes.  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  Resultant  Mild dysarthria  MRI - acute infarct of the inferior left cerebellar hemisphere.  MRA - occlusion of the left posterior inferior cerebellar artery. Suspected severe stenosis of the Lt [place].  Carotid Doppler - 2/6/12018 - Bilateral - 1% to 39% ICA stenosis lowest end of scale. Vertebral artery flow is antegrade.  2D Echo - pending  LDL - 119 (06/14/2016)  HgbA1c - 5.7 (06/14/2016)  VTE prophylaxis - SCDs Diet NPO time specified  Xarelto (rivaroxaban) daily prior to admission, now on aspirin 300 mg suppository daily  Patient counseled to be compliant with his antithrombotic medications  Ongoing aggressive stroke risk factor management  Therapy recommendations: pending  Disposition: Pending  Hypertension  Stable  Recommend permissive hypertension as medically tolerated given cardiac disease (OK if < 220/120) but gradually normalize in 5-7  days  Long-term BP goal normotensive  Hyperlipidemia  Home meds: No lipid lowering medications prior to admission  LDL 119, goal < 70  Add statin when PO access is available  Continue statin at discharge  Other Stroke Risk Factors  Advanced age  Overweight, Body mass index is 28.12 kg/m., recommend weight loss, diet and exercise as appropriate   Hx stroke/TIA  Coronary artery disease  Atrial fibrillation  Medical noncompliance  Endocarditis  Other Active Problems  Recent endocarditis - infectious disease consult pending  Paroxysmal atrial fibrillation / endocarditis / bradycardia- cardiology consult pending  Anemia - 9.9 ;  29  Thrombocytopenia - 72 K  Leukocytosis - 18.6 ( Tmax 99.7)  Kidney disease - 36 ; 1.6 improved since initial labs  Recent [facility] admission - 07/19/2016 - 07/23/2016 - pneumonia   Hospital day # 0  CRITICAL CARE NEUROLOGY ATTENDING NOTE This patient is critically ill and at significant risk of neurological worsening, death and care requires constant monitoring of vital signs, hemodynamics,respiratory and cardiac monitoring, extensive review of multiple databases, frequent neurological assessment, discussion with family, other specialists and medical decision making of high complexity. This critical care time does not reflect procedure time, or teaching time or supervisory time of PA/NP/Med Resident etc. but could involve care discussion time. Patient was seen and examined by me personally.   I reviewed notes, independently viewed imaging studies. The laboratory and radiographic studies were personally reviewed by me.  ROS: completed by me personally and pertinent positives documented as allowed by patient's ability to discuss issues at length  Assessment and plan completed by me personally:  Accepted patient in transfer; ordered STAT CT and reviewed  Discussed case with Neurosurgery on call today; no surgical invention needed at this time  Follow stroke protocols  NIHSS's and neurologic exams to monitor for signs of obstructive hydrocephalus and/or herniation  Neurologic exam stable at this time; if exam changes, plan repeat CT to r/o hydrocephalus.  At that time will need to determine based on the most current labs hypertonic versus osmotic diuresis for care plan  Anticoagulation question is challenging given known afib and now large ischemic burden (I.e. >1/3 arterial territory) of cerebellar stroke.  Will continue ASA only given risk for hemorrhagic transformation in the setting of anticoagulation  ECHO to rule out current thrombus   Will recheck labs today  Previously; Internal  Medicine following Leukocytosis.  History complicated by endocarditis and c-dif.  They consulted ID  Thrombocytopenia; will monitor  Condition: critical   I spent 40 minutes of Neurocritical Care time in the care of  this patient.  SIGNED BY: Dr. Elissa Hefty    To contact Stroke Continuity provider, please refer to [email or web address] After hours, contact General Neurology

## 2016-07-25 NOTE — Progress Notes (Signed)
Dr. Aundra Dubin called back and stated that Dr. Irene Limbo will need to be contacted to fulfill cardiology consult.

## 2016-07-25 NOTE — ED Notes (Signed)
Voice is gurgly failed swallow screen

## 2016-07-25 NOTE — Progress Notes (Signed)
Patient has critical result on repeat EKG, prolonged QT intervals. Overnight RN reports paging primary with no response. Patient noted to have runs of  bygiminy, a 32 beat run of SVT, and multiple PVC's. Orders noted to have STAT consult to cardiology. Neurology has seen patient, PCCM has seen patient as well but no completed note at this time. RN has paged PCCM and waiting for response. RN has also paged primary, waiting for response.

## 2016-07-25 NOTE — Consult Note (Addendum)
CARDIOLOGY CONSULT NOTE  Patient ID: Peter Bruce MRN: [id number] DOB/AGE: [birth year] 66 y.o.  Admit date: 07/24/2016 Referring Physician Reyne Dumas,  MD Primary Physician:  Adrian Prows, MD Reason for Consultation  New stroke, endocarditis, PVC  HPI: Peter Bruce  is a 66 y.o. male  With known coronary artery disease and did undergone CABG 3 on 05/29/2015. 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  He was admitted on 06/11/2016 and discharged on 06/17/2016 with mitral valve and endocarditis, septic emboli to his brain, on IV antibiotic therapy.  He had streptococcal endocarditis. He was re-admitted with pneumonia on 07/19/16 and discharged 2 days ago. 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  Prior to presentation, he had missed 2 doses of Xarelto, one day before the admission and on the day of admission.  Events prior to the admission noted including bradycardia, altered mental status and hypotension and PVCs.  These symptoms have resolved.  This afternoon he and his wife at the bedside, he is alert and oriented 3 and I was able to obtain all the history.  He feels very tired and weak but otherwise states that he feels well enough to go home and wants to eat.  He has been made n.p.o. due to aspiration risk with new stroke.  No fever, no chills.  He complains of scrotal edema and leg edema that is getting slightly worse.  No fever, no chills.  No headache or visual disturbances.  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 He also has diarrhea, loss of appetite, not feeling well.his past medical history significant for Paroxysmal atrial fibrillation, history of DVT and PE and has been on long-term anticoagulation.  Past Medical History:  Diagnosis Date  . Acute CHF (congestive heart failure) ([place]) 11/01/2015  . Acute  on chronic diastolic heart failure ([place]) 09/27/2015  . Acute pulmonary embolus ([place]) 2010  . Acute respiratory failure with hypoxia ([place]) 09/09/2015  . Arthritis   . Atrial fibrillation [facility])    s/p inpatient DCCV 09/11/2015  . Atrial flutter ([place])   . CHF (congestive heart failure) ([place])   . Coronary artery disease involving native coronary artery 05/15/2015   multivessel  . Dysrhythmia    A-fib  . Essential hypertension   . Fibromyalgia   . History of kidney stones   . Hypercholesterolemia   . Hypertension   . Lupus anticoagulant positive 07/29/2008  . Mitral regurgitation 10/30/2015  . PMR (polymyalgia rheumatica) (HCC)   . Pneumonia   . 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 S/P Maze operation for atrial fibrillation 05/29/2015   Complete bilateral atrial lesion set via median sternotomy using bipolar radiofrequency and cryothermy ablation with clipping of LA appendage  . 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 APPENDECTOMY    . arm surgery  Right    wrist and elbow  . CARDIAC CATHETERIZATION N/A 05/15/2015   Procedure: Left Heart Cath and Coronary Angiography;  Surgeon: Peter M Martinique, MD;  Location: [place] CV LAB;  Service: Cardiovascular;  Laterality: N/A;  . CARDIAC CATHETERIZATION N/A 11/17/2015   Procedure: Right/Left Heart Cath and Coronary/Graft Angiography;  Surgeon: Adrian Prows, MD;  Location: [place] CV LAB;  Service: Cardiovascular;  Laterality: N/A;  . CARDIOVERSION N/A 09/11/2015   Procedure: CARDIOVERSION;  Surgeon: Lelon Perla, MD;  Location: [facility] ENDOSCOPY;  Service: Cardiovascular;  Laterality: N/A;  . CHEST TUBE  INSERTION  11/21/2015   Procedure: LEFT CHEST TUBE INSERTION;  Surgeon: Rexene Alberts, MD;  Location: [place];  Service: Open Heart Surgery;;  . CLIPPING OF ATRIAL APPENDAGE  05/29/2015   Procedure: CLIPPING OF LEFT ATRIAL APPENDAGE;  Surgeon: Rexene Alberts, MD;  Location: [place];  Service: Open Heart Surgery;;  45 AtriClip PRO 145  . CORONARY ARTERY BYPASS GRAFT N/A 05/29/2015   Procedure: CORONARY ARTERY BYPASS GRAFTING (CABG), ON PUMP, TIMES THREE, USING LEFT INTERNAL MAMMARY ARTERY, BILATERAL GREATER SAPHENOUS VEINS HARVESTED ENDOSCOPICALLY;  Surgeon: Rexene Alberts, MD;  Location: [place];  Service: Open Heart Surgery;  Laterality: N/A;  . FRACTURE SURGERY     right wrist  . HERNIA REPAIR    . MAZE N/A 05/29/2015   Procedure: MAZE;  Surgeon: Rexene Alberts, MD;  Location: [place];  Service: Open Heart Surgery;  Laterality: N/A;  Complete Bi-Atrial Lesion set with Ablation and Cryothermy  . MITRAL VALVE REPAIR Right 11/21/2015   Procedure: MINIMALLY INVASIVE REOPERATION FOR MITRAL VALVE REPAIR;  Surgeon: Rexene Alberts, MD;  Location: [place];  Service: Open Heart Surgery;  Laterality: Right;  . PERIPHERAL VASCULAR CATHETERIZATION Bilateral 11/17/2015   Procedure: Renal Angiography;  Surgeon: Adrian Prows, MD;  Location: [place] CV LAB;  Service: Cardiovascular;  Laterality: Bilateral;  . PERIPHERAL VASCULAR CATHETERIZATION N/A 11/17/2015   Procedure: Abdominal Aortogram;  Surgeon: Adrian Prows, MD;  Location: [place] CV LAB;  Service: Cardiovascular;  Laterality: N/A;  . SHOULDER SURGERY Left    clavicular fracture  . TEE WITHOUT CARDIOVERSION N/A 05/29/2015   Procedure: TRANSESOPHAGEAL ECHOCARDIOGRAM (TEE);  Surgeon: Rexene Alberts, MD;  Location: [place];  Service: Open Heart Surgery;  Laterality: N/A;  . TEE WITHOUT CARDIOVERSION N/A 10/30/2015   Procedure: TRANSESOPHAGEAL ECHOCARDIOGRAM (TEE);  Surgeon: Adrian Prows, MD;  Location: [place];  Service: Cardiovascular;  Laterality: N/A;  . TEE  WITHOUT CARDIOVERSION N/A 11/21/2015   Procedure: TRANSESOPHAGEAL ECHOCARDIOGRAM (TEE);  Surgeon: Rexene Alberts, MD;  Location: [place];  Service: Open Heart Surgery;  Laterality: N/A;     Family History  Problem Relation Age of Onset  . Cancer Mother   . Other Sister     chf  . Other Sister     Rosalee Kaufman     Social History: Social History   Social History  . Marital status: Married    Spouse name: N/A  . Number of children: N/A  . Years of education: N/A   Occupational History  . licensed Chief Financial Officer     self-employed   Social History Main Topics  . Smoking status: Never Smoker  . Smokeless tobacco: Never Used  . Alcohol use No  . Drug use: No  . Sexual activity: Not on file   Other Topics Concern  . Not on file   Social History Narrative   Lives with his wife (married 1990).  Former Health visitor. Body building/weight lifting, running for exercise.     Prescriptions Prior to Admission  Medication Sig Dispense Refill Last Dose  . acetaminophen (  TYLENOL) 325 MG tablet Take 2 tablets (650 mg total) by mouth every 6 (six) hours as needed for mild pain (or Fever >/= 101). 20 tablet 0   . amoxicillin-clavulanate (AUGMENTIN) 875-125 MG tablet Take 1 tablet by mouth 2 (two) times daily. 10 tablet 0 07/24/2016 at am  . diazepam (VALIUM) 10 MG tablet Take 10 mg by mouth at bedtime as needed for anxiety or sleep.    Past Week at Unknown time  . Multiple Vitamin (MULTIVITAMIN WITH MINERALS) TABS tablet Take 1 tablet by mouth daily.   Past Week at Unknown time  . ondansetron (ZOFRAN ODT) 8 MG disintegrating tablet Take 1 tablet (8 mg total) by mouth every 8 (eight) hours as needed for nausea or vomiting. 20 tablet 1 Past Month at Unknown time  . oxyCODONE-acetaminophen (PERCOCET) 7.5-325 MG tablet Take 1 tablet by mouth 3 (three) times daily as needed for severe pain.   Past Week at Unknown time  . rivaroxaban (XARELTO) 20 MG TABS tablet Take 1 tablet (20 mg  total) by mouth daily with supper. 30 tablet 2 07/16/2016 at 1800  . tiZANidine (ZANAFLEX) 4 MG tablet Take 2-4 mg by mouth at bedtime as needed for muscle spasms.   Past Week at Unknown time  . valsartan (DIOVAN) 160 MG tablet Take 1 tablet (160 mg total) by mouth daily.   Past Week at Unknown time  . zolpidem (AMBIEN) 10 MG tablet Take 10 mg by mouth at bedtime as needed for sleep.    Past Week at Unknown time     ROS: General: Marked fatigue Eyes: no blurry vision, diplopia, or amaurosis ENT: no sore throat or hearing loss Resp:Shortness of Breath CV: Leg edema   GI: no abdominal pain,has  Diarrhea, no blood. GU: Scrotal swelling Skin: no rash Neuro: no headache, numbness, tingling, or weakness of extremities Musculoskeletal: Severe chronic back pain Heme: no bleeding, DVT, or easy bruising Endo: no polydipsia or polyuria    Physical Exam: Blood pressure 130/84, pulse 83, temperature 99.1 F (37.3 C), temperature source Oral, resp. rate (!) 21, height 5\' 10"  (1.778 m), weight 194 lb 14.2 oz (88.4 kg), SpO2 97 %.   General appearance: alert, cooperative, appears stated age and no distress Lungs: faintscattered crackles diffuse, rightbase slightly worse. Chest wall: no tenderness Heart: S1 is be able, S2 is normal, 1/6 midsystolic murmur at the apex.  No gallop. Abdomen: soft, non-tender; bowel sounds normal; no masses,  no organomegaly Extremities: Full range of motion and is, 2+ leg edema right worse than the left. Skin: Skin color, texture, turgor normal. No rashes or lesions Neurologic: Grossly normal  Scrotum and penis: Marked scrotal edema present.  Labs:   Lab Results  Component Value Date   WBC 21.1 (H) 07/25/2016   HGB 10.1 (L) 07/25/2016   HCT 31.7 (L) 07/25/2016   MCV 95.2 07/25/2016   PLT 95 (L) 07/25/2016     Recent Labs Lab 07/25/16 1400  NA 134*  K 4.0  CL 105  CO2 21*  BUN 29*  CREATININE 1.51*  CALCIUM 8.2*  PROT 5.2*  BILITOT 1.1  ALKPHOS 52   ALT 84*  AST 44*  GLUCOSE 109*    Lipid Panel     Component Value Date/Time   CHOL 170 06/14/2016 0235   TRIG 133 06/14/2016 0235   HDL 24 (L) 06/14/2016 0235   CHOLHDL 7.1 06/14/2016 0235   VLDL 27 06/14/2016 0235   LDLCALC 119 (H) 06/14/2016 0235  BNP (last 3 results)  Recent Labs  11/06/15 0334 06/11/16 1035 07/24/16 2008  BNP 613.6* 259.0* 1,931.1*    HEMOGLOBIN A1C Lab Results  Component Value Date   HGBA1C 5.7 (H) 06/14/2016   MPG 117 06/14/2016   TSH  Recent Labs  09/19/15 1139 06/12/16 1118 07/24/16 2249  TSH 1.98 1.071 3.397    Radiology: Ct Head Wo Contrast  Result Date: 07/25/2016 CLINICAL DATA:  Followup stroke. Acute left PICA distribution. Other older embolic infarctions. EXAM: CT HEAD WITHOUT CONTRAST TECHNIQUE: Contiguous axial images were obtained from the base of the skull through the vertex without intravenous contrast. COMPARISON:  MRI same day.  CT yesterday. FINDINGS: Brain: Generalized atrophy again demonstrated. Low-density within the inferior cerebellum on the left appears the same, consistent with acute left PICA infarction. No evidence of hemorrhage. No increased swelling. No fourth ventricular compromise. Chronic small-vessel ischemic changes of the white matter remain evident. No hydrocephalus. No extra-axial collection. Vascular: There is atherosclerotic calcification of the major vessels at the base of the brain. Skull: Negative Sinuses/Orbits: Clear/normal Other: None IMPRESSION: No change since yesterday. Acute left PICA infarction with low-density in the inferior cerebellum on the left. No hemorrhage. No increased swelling. No fourth ventricular compromise. Electronically Signed   By: Nelson Chimes M.D.   On: 07/25/2016 11:55   Ct Head Wo Contrast  Result Date: 07/24/2016 CLINICAL DATA:  Altered mental status. Patient woke up screening in then was unresponsive. Left the hospital yesterday with diagnosis of Clostridium difficile.  EXAM: CT HEAD WITHOUT CONTRAST TECHNIQUE: Contiguous axial images were obtained from the base of the skull through the vertex without intravenous contrast. COMPARISON:  MRI brain 06/12/2016.  CT head 06/11/2016. FINDINGS: Brain: Focal low-attenuation areas demonstrated in the left anterior temporal lobe and in the left inferior cerebellar hemisphere, progressing since previous study, suggesting acute ischemic process. The left temporal changes were demonstrated on the previous MRI but the cerebellar changes appear to be new since that time. There is mild mass effect in the left posterior fossa with mild displacement and effacement of the fourth ventricle. 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 Diffuse cerebral atrophy. Ventricular dilatation consistent with central atrophy. Low-attenuation in the deep white matter consistent with small vessel ischemia. No midline shift. No abnormal extra-axial fluid collections. Gray-white matter junctions are distinct. Basal cisterns are not effaced. No acute intracranial hemorrhage. Vascular: Vascular calcifications are present in the carotid siphons and vertebrobasilar arteries peer Skull: Calvarium appears intact. Sinuses/Orbits: Paranasal sinuses and mastoid air cells are clear. Other: None. IMPRESSION: 1. Focal low-attenuation in the left anterior temporal lobe consistent with progressing infarct in the distribution of the left middle cerebral artery and as previously seen on MRI. 2. New area of low-attenuation in the left inferior cerebellar hemisphere with associated mass effect is consistent with acute infarct in the distribution of the left posterior inferior cerebral artery. This was not present on prior MRI, suggesting a new interval process. 3. Chronic atrophy and small vessel ischemic changes. No acute intracranial hemorrhage. Electronically Signed   By: Lucienne Capers M.D.   On: 07/24/2016 23:51   Mr Jodene Nam Head Wo Contrast  Result Date: 07/25/2016 CLINICAL DATA:  History  of intracranial septic emboli. Altered mental status with speech disturbance. EXAM: MRI HEAD WITHOUT CONTRAST MRA HEAD WITHOUT CONTRAST TECHNIQUE: Multiplanar, multiecho pulse sequences of the brain and surrounding structures were obtained without intravenous contrast. Angiographic images of the head were obtained using MRA technique without contrast. COMPARISON:  Brain MRI 12/30/2016 FINDINGS: MRI  HEAD FINDINGS Brain: There is diffusion restriction throughout the lower left cerebellum, in the distribution of the left posterior inferior cerebellar artery. There are multiple other areas of high signal on diffusion-weighted imaging in the posterior right temporal lobe and lateral left temporal lobe, as well as in the periatrial white matter of the right parietal lobe. ADC values at these locations are relatively normal, likely indicating that these are areas of subacute ischemia. There is hyperintense T2 weighted signal within the left cerebellum, left occipital lobe, left temporal lobe and within the periventricular white matter. A No mass lesion or midline shift. No hydrocephalus or extra-axial fluid collection. The midline structures are normal. No age advanced or lobar predominant atrophy. Vascular: Major intracranial arterial and venous sinus flow voids are preserved. Two foci of punctate chronic microhemorrhage of the left basal ganglia. Skull and upper cervical spine: The visualized skull base, calvarium, upper cervical spine and extracranial soft tissues are normal. Sinuses/Orbits: No fluid levels or advanced mucosal thickening. No mastoid effusion. Normal orbits. MRA HEAD FINDINGS Intracranial internal carotid arteries: Normal. Anterior cerebral arteries: Congenitally absent left A1 segment. Otherwise normal. Middle cerebral arteries: Normal. Posterior communicating arteries: Absent bilaterally. Posterior cerebral arteries: Normal. Basilar artery: Normal. Vertebral arteries: Right-dominant. There is minimal  flow related enhancement within the left vertebral artery. Superior cerebellar arteries: Normal. Anterior inferior cerebellar arteries: Normal. Posterior inferior cerebellar arteries: Normal on the right. Left PICA is not visualized and likely occluded. IMPRESSION: 1. 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 No associated acute hemorrhage or mass effect. 2. Scattered areas of DWI abnormality in the left temporal lobe, right temporal lobe and right parietal lobe are favored to be areas of subacute ischemia. The distribution is again suggestive of a central embolic process, though likely occurring at a different time from the current acute left PICA infarct. 3. 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 Electronically Signed   By: Ulyses Jarred M.D.   On: 07/25/2016 03:15   Mr Brain Wo Contrast  Result Date: 07/25/2016 CLINICAL DATA:  History of intracranial septic emboli. Altered mental status with speech disturbance. EXAM: MRI HEAD WITHOUT CONTRAST MRA HEAD WITHOUT CONTRAST TECHNIQUE: Multiplanar, multiecho pulse sequences of the brain and surrounding structures were obtained without intravenous contrast. Angiographic images of the head were obtained using MRA technique without contrast. COMPARISON:  Brain MRI 12/30/2016 FINDINGS: MRI HEAD FINDINGS Brain: There is diffusion restriction throughout the lower left cerebellum, in the distribution of the left posterior inferior cerebellar artery. There are multiple other areas of high signal on diffusion-weighted imaging in the posterior right temporal lobe and lateral left temporal lobe, as well as in the periatrial white matter of the right parietal lobe. ADC values at these locations are relatively normal, likely indicating that these are areas of subacute ischemia. There is hyperintense T2 weighted signal within the left cerebellum, left occipital lobe, left  temporal lobe and within the periventricular white matter. A No mass lesion or midline shift. No hydrocephalus or extra-axial fluid collection. The midline structures are normal. No age advanced or lobar predominant atrophy. Vascular: Major intracranial arterial and venous sinus flow voids are preserved. Two foci of punctate chronic microhemorrhage of the left basal ganglia. Skull and upper cervical spine: The visualized skull base, calvarium, upper cervical spine and extracranial soft tissues are normal. Sinuses/Orbits: No fluid levels or advanced mucosal thickening. No mastoid effusion. Normal orbits. MRA HEAD FINDINGS Intracranial internal carotid arteries:  Normal. Anterior cerebral arteries: Congenitally absent left A1 segment. Otherwise normal. Middle cerebral arteries: Normal. Posterior communicating arteries: Absent bilaterally. Posterior cerebral arteries: Normal. Basilar artery: Normal. Vertebral arteries: Right-dominant. There is minimal flow related enhancement within the left vertebral artery. Superior cerebellar arteries: Normal. Anterior inferior cerebellar arteries: Normal. Posterior inferior cerebellar arteries: Normal on the right. Left PICA is not visualized and likely occluded. IMPRESSION: 1. 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 No associated acute hemorrhage or mass effect. 2. Scattered areas of DWI abnormality in the left temporal lobe, right temporal lobe and right parietal lobe are favored to be areas of subacute ischemia. The distribution is again suggestive of a central embolic process, though likely occurring at a different time from the current acute left PICA infarct. 3. 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 Electronically Signed   By: Ulyses Jarred M.D.   On: 07/25/2016 03:15   Dg Chest Portable 1 View  Result Date: 07/24/2016 CLINICAL DATA:  66 year old male with altered  mental status EXAM: PORTABLE CHEST 1 VIEW COMPARISON:  Chest radiograph dated 07/19/2016 FINDINGS: There has been interval removal of the right-sided PICC. There is stable cardiomegaly. Mechanical cardiac valve and left atrial appendage occlusion device noted. There is mild vascular congestion. There has been significant interval improvement of the previously seen right pleural effusion and right lung base airspace densities. Small amount of pleural effusion and associated atelectasis versus infiltrate remains. The left lung is clear. There is no pneumothorax. Median sternotomy wires noted. No acute osseous pathology. IMPRESSION: 1. Interval improvement of the previously seen right-sided pleural effusion and right lung base airspace densities with minimal amount of residual pleural effusion and associated right lung base atelectasis/infiltrate. 2. Stable cardiomegaly with probable mild vascular congestion. No pulmonary edema. Electronically Signed   By: Anner Crete M.D.   On: 07/24/2016 20:23   Dg Swallowing Func-speech Pathology  Result Date: 07/25/2016 Objective Swallowing Evaluation: Type of Study: MBS-Modified Barium Swallow Study Patient Details Name: Peter Bruce MRN: [id number] Date of Birth: 29-Apr-1951 Today's Date: 07/25/2016 Time: SLP Start Time (ACUTE ONLY): 1530-SLP Stop Time (ACUTE ONLY): 1550 SLP Time Calculation (min) (ACUTE ONLY): 20 min Past Medical History: Past Medical History: Diagnosis Date . Acute CHF (congestive heart failure) ([place]) 11/01/2015 . Acute on chronic diastolic heart failure ([place]) 09/27/2015 . Acute pulmonary embolus ([place]) 2010 . Acute respiratory failure with hypoxia ([place]) 09/09/2015 . Arthritis  . Atrial fibrillation [facility])   s/p inpatient DCCV 09/11/2015 . Atrial flutter ([place])  . CHF (congestive heart failure) ([place])  . Coronary artery disease involving native coronary artery 05/15/2015  multivessel . Dysrhythmia   A-fib . Essential hypertension  . Fibromyalgia  . History of kidney  stones  . Hypercholesterolemia  . Hypertension  . Lupus anticoagulant positive 07/29/2008 . Mitral regurgitation 10/30/2015 . PMR (polymyalgia rheumatica) (HCC)  . Pneumonia  . 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 S/P Maze operation for atrial fibrillation 05/29/2015  Complete bilateral atrial lesion set via median sternotomy using bipolar radiofrequency and cryothermy ablation with clipping of LA appendage . S/P minimally invasive mitral valve repair 11/21/2015  Complex valvuloplasty including artificial Gore-tex neochord placement x12 with 32 mm Sorin Memo 3D ring annuloplasty via right mini thoracotomy approach . 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 APPENDECTOMY   . arm surgery  Right   wrist and elbow . CARDIAC CATHETERIZATION N/A 05/15/2015  Procedure: Left Heart Cath and Coronary Angiography;  Surgeon: Peter M Martinique, MD;  Location: [place] CV LAB;  Service: Cardiovascular;  Laterality: N/A; . CARDIAC CATHETERIZATION N/A 11/17/2015  Procedure: Right/Left Heart Cath and Coronary/Graft Angiography;  Surgeon: Adrian Prows, MD;  Location: [place] CV LAB;  Service: Cardiovascular;  Laterality: N/A; . CARDIOVERSION N/A 09/11/2015  Procedure: CARDIOVERSION;  Surgeon: Lelon Perla, MD;  Location: [facility] ENDOSCOPY;  Service: Cardiovascular;  Laterality: N/A; . CHEST TUBE INSERTION  11/21/2015  Procedure: LEFT CHEST TUBE INSERTION;  Surgeon: Rexene Alberts, MD;  Location: [place];  Service: Open Heart Surgery;; . CLIPPING OF ATRIAL APPENDAGE  05/29/2015  Procedure: CLIPPING OF LEFT ATRIAL APPENDAGE;  Surgeon: Rexene Alberts, MD;  Location: [place];  Service: Open Heart Surgery;;  45 AtriClip PRO 145 . CORONARY ARTERY BYPASS GRAFT N/A 05/29/2015  Procedure: CORONARY ARTERY BYPASS GRAFTING (CABG), ON PUMP, TIMES THREE, USING LEFT INTERNAL MAMMARY ARTERY, BILATERAL GREATER  SAPHENOUS VEINS HARVESTED ENDOSCOPICALLY;  Surgeon: Rexene Alberts, MD;  Location: [place];  Service: Open Heart Surgery;  Laterality: N/A; . FRACTURE SURGERY    right wrist . HERNIA REPAIR   . MAZE N/A 05/29/2015  Procedure: MAZE;  Surgeon: Rexene Alberts, MD;  Location: [place];  Service: Open Heart Surgery;  Laterality: N/A;  Complete Bi-Atrial Lesion set with Ablation and Cryothermy . MITRAL VALVE REPAIR Right 11/21/2015  Procedure: MINIMALLY INVASIVE REOPERATION FOR MITRAL VALVE REPAIR;  Surgeon: Rexene Alberts, MD;  Location: [place];  Service: Open Heart Surgery;  Laterality: Right; . PERIPHERAL VASCULAR CATHETERIZATION Bilateral 11/17/2015  Procedure: Renal Angiography;  Surgeon: Adrian Prows, MD;  Location: [place] [facility];  Service: Cardiovascular;  Laterality: Bilateral; . PERIPHERAL VASCULAR CATHETERIZATION N/A 11/17/2015  Procedure: Abdominal Aortogram;  Surgeon: Adrian Prows, MD;  Location: [place] [facility];  Service: Cardiovascular;  Laterality: N/A; . SHOULDER SURGERY Left   clavicular fracture . TEE WITHOUT CARDIOVERSION N/A 05/29/2015  Procedure: TRANSESOPHAGEAL ECHOCARDIOGRAM (TEE);  Surgeon: Rexene Alberts, MD;  Location: [place];  Service: Open Heart Surgery;  Laterality: N/A; . TEE WITHOUT CARDIOVERSION N/A 10/30/2015  Procedure: TRANSESOPHAGEAL ECHOCARDIOGRAM (TEE);  Surgeon: Adrian Prows, MD;  Location: [place];  Service: Cardiovascular;  Laterality: N/A; . 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 Presented  with episode of unresponsiveness. HEad CT shows new CVA (Occlusion of the left posterior inferior cerebellar artery with associated acute infarct of the inferior left cerebellar hemisphere, Scattered areas of DWI abnormality in the left temporal lobe, right temporal lobe and right parietal lobe are favored to be areas of subacute ischemia). Recently discharged from [facility] where he was admitted with generalized weakness and found to have NCAP with acute hypoxia nad respiratory failure. Admission CXR: Interval improvement of the previously seen right-sided pleural effusion and right lung base airspace densities with minimal amount of residual pleural effusion and associated right lung base atelectasis/infiltrate. Note that bedside swallow evaluation complete during 06/2016 admission  with noted s/s of aspiration, recommendations for dysphagia 3, thin liquids and MBS however at the time patient declined MBS.  Subjective: "i can chew anything." Assessment / Plan / Recommendation CHL IP CLINICAL IMPRESSIONS 07/25/2016 Clinical Impression Patient presents with moderate, multifactorial pharyngeal dysphagia in the setting of acute CVA and recently diagnosed base of tongue cancer. Patient with anatomical changes (suspect 2/2 BOT cancer) and reduced hyolarygneal excursion which impact airway protection and bolus clearance through the UES. This frequently resulted in shallow penetration during the swallow with all liquid consistencies. Patient also has sensory deficits leading to premature spillage with thin and nectar thick liquids when taken in consecutive sips with both sensed and silent aspiration during the swallow. Mild-moderate residue remained in the pyriform sinuses and cp segment after the swallow with all consistencies (thin, nectar and honey-thick liquids) and solids. This residue occasionally spilled into the larygneal vestibule after the swallow. Pt with difficulty following commands for swallow  maneuvers or even to take single sips of liquid. Responded well to very basic commands "Sip, swallow, cough, swallow," but suspect he will have difficulty with carryover of this strategy without supervision. Patient was consistently able to clear penetrate with the above cues, and subsequent swallow is noted to reduce residue. Spoke with patient regarding risks of aspiration, which is minimized with use of strategies. He expresses a desire to continue eating and drinking. Recommend dys 3 diet with thin liquid via single cup sip only, with use of hard cough, reswallow after each sip, bite. Will follow up with patient and his wife for education and training in compensatory strategies, diet tolerance. SLP Visit Diagnosis Dysphagia, pharyngeal phase (R13.13) Attention and concentration deficit following -- Frontal lobe and executive function deficit following -- Impact on safety and function Severe aspiration risk;Risk for inadequate nutrition/hydration;Moderate aspiration risk   CHL IP TREATMENT RECOMMENDATION 07/25/2016 Treatment Recommendations Therapy as outlined in treatment plan below   Prognosis 07/25/2016 Prognosis for Safe Diet Advancement Fair Barriers to Reach Goals Other (Comment) Barriers/Prognosis Comment (No Data) CHL IP DIET RECOMMENDATION 07/25/2016 SLP Diet Recommendations Dysphagia 3 (Mech soft) solids;Thin liquid Liquid Administration via Cup Medication Administration Whole meds with puree Compensations Minimize environmental distractions;Slow rate;Small sips/bites;Hard cough after swallow;Multiple dry swallows after each bite/sip Postural Changes Seated upright at 90 degrees   CHL IP OTHER RECOMMENDATIONS 07/25/2016 Recommended Consults -- Oral Care Recommendations Oral care before and after PO Other Recommendations --   CHL IP FOLLOW UP RECOMMENDATIONS 07/25/2016 Follow up Recommendations Outpatient SLP   CHL IP FREQUENCY AND DURATION 07/25/2016 Speech Therapy Frequency (ACUTE ONLY) min 2x/week Treatment  Duration 2 weeks      CHL IP ORAL PHASE 07/25/2016 Oral Phase WFL Oral - Pudding Teaspoon -- Oral - Pudding Cup -- Oral - Honey Teaspoon -- Oral - Honey Cup -- Oral - Nectar Teaspoon -- Oral - Nectar Cup -- Oral - Nectar Straw -- Oral - Thin Teaspoon -- Oral - Thin Cup -- Oral - Thin Straw -- Oral - Puree -- Oral - Mech Soft -- Oral - Regular -- Oral - Multi-Consistency -- Oral - Pill -- Oral Phase - Comment --  CHL IP PHARYNGEAL PHASE 07/25/2016 Pharyngeal Phase Impaired Pharyngeal- Pudding Teaspoon -- Pharyngeal -- Pharyngeal- Pudding Cup -- Pharyngeal -- Pharyngeal- Honey Teaspoon -- Pharyngeal -- Pharyngeal- Honey Cup Reduced epiglottic inversion;Reduced airway/laryngeal closure;Penetration/Aspiration during swallow;Penetration/Apiration after swallow;Pharyngeal residue - pyriform;Pharyngeal residue - cp segment;Reduced laryngeal elevation;Reduced anterior laryngeal mobility Pharyngeal Material enters airway, remains ABOVE vocal cords and not ejected out Pharyngeal-  Nectar Teaspoon -- Pharyngeal -- Pharyngeal- Nectar Cup Reduced epiglottic inversion;Reduced airway/laryngeal closure;Reduced laryngeal elevation;Reduced anterior laryngeal mobility;Penetration/Aspiration during swallow;Penetration/Apiration after swallow;Trace aspiration;Pharyngeal residue - cp segment;Pharyngeal residue - pyriform Pharyngeal Material enters airway, passes BELOW cords without attempt by patient to eject out (silent aspiration) Pharyngeal- Nectar Straw -- Pharyngeal -- Pharyngeal- Thin Teaspoon Reduced anterior laryngeal mobility;Reduced epiglottic inversion;Reduced laryngeal elevation;Reduced airway/laryngeal closure;Penetration/Aspiration during swallow;Penetration/Apiration after swallow;Moderate aspiration;Pharyngeal residue - pyriform;Pharyngeal residue - cp segment Pharyngeal Material enters airway, remains ABOVE vocal cords and not ejected out Pharyngeal- Thin Cup Reduced epiglottic inversion;Reduced anterior laryngeal  mobility;Reduced laryngeal elevation;Reduced airway/laryngeal closure;Moderate aspiration;Penetration/Aspiration during swallow;Penetration/Apiration after swallow;Pharyngeal residue - pyriform;Pharyngeal residue - cp segment Pharyngeal Material enters airway, passes BELOW cords and not ejected out despite cough attempt by patient Pharyngeal- Thin Straw -- Pharyngeal -- Pharyngeal- Puree Reduced epiglottic inversion;Reduced anterior laryngeal mobility;Reduced laryngeal elevation;Reduced airway/laryngeal closure;Penetration/Apiration after swallow;Pharyngeal residue - pyriform;Pharyngeal residue - cp segment Pharyngeal Material enters airway, remains ABOVE vocal cords and not ejected out Pharyngeal- Mechanical Soft Reduced epiglottic inversion;Reduced anterior laryngeal mobility;Reduced laryngeal elevation;Reduced airway/laryngeal closure;Penetration/Apiration after swallow;Pharyngeal residue - pyriform;Pharyngeal residue - cp segment Pharyngeal Material enters airway, remains ABOVE vocal cords and not ejected out Pharyngeal- Regular Reduced epiglottic inversion;Reduced anterior laryngeal mobility;Reduced laryngeal elevation;Reduced airway/laryngeal closure;Penetration/Apiration after swallow;Pharyngeal residue - pyriform;Pharyngeal residue - cp segment Pharyngeal Material enters airway, remains ABOVE vocal cords and not ejected out Pharyngeal- Multi-consistency -- Pharyngeal -- Pharyngeal- Pill WFL Pharyngeal -- Pharyngeal Comment --  CHL IP CERVICAL ESOPHAGEAL PHASE 07/25/2016 Cervical Esophageal Phase Impaired Pudding Teaspoon -- Pudding Cup -- Honey Teaspoon -- Honey Cup -- Nectar Teaspoon -- Nectar Cup -- Nectar Straw -- Thin Teaspoon -- Thin Cup -- Thin Straw -- Puree -- Mechanical Soft -- Regular -- Multi-consistency -- Pill -- Cervical Esophageal Comment reduced duration and amplitude of opening 2/2 impaired hyolaryngeal excursion Deneise Lever, MS CF-SLP Speech-Language Pathologist [phone] No flowsheet  data found. Aliene Altes 07/25/2016, 6:36 PM               Scheduled Meds: .  stroke: mapping our early stages of recovery book   Does not apply Once  . aspirin  300 mg Rectal Daily   Or  . aspirin  325 mg Oral Daily  . mouth rinse  15 mL Mouth Rinse BID   Continuous Infusions: . sodium chloride 75 mL/hr at 07/25/16 1920   PRN Meds:.acetaminophen **OR** acetaminophen (TYLENOL) oral liquid 160 mg/5 mL **OR** acetaminophen   Office Echocardiogram 07/07/2016: Left ventricle cavity is normal in size. Mild asymmetric hypertrophy of the left ventricle. Normal global wall motion. Visual EF is 50-55%. Left atrial cavity is severely dilated at 5.7 cm. Right atrial cavity is severely dilated. Mild (Grade I) aortic regurgitation. MV ring noted. A large sized non-mobile vegetation is present on the anterior mitral valve leaflet measuring 1.5x1.3 cm. Appearence of chronic vegetation with echo bright appearence.  Severe mitral valve stenosis by gradients, but visually appears to be mild. Mitral valve peak pressure gradient of 30.5 and mean gradient of 13.5 mmHg, calculated mitral valve area  2.0  cm. Trace mitral regurgitation. Mild tricuspid regurgitation. Moderate pulmonary hypertension. Pulmonary artery systolic pressure is estimated at 42 mm Hg. IVC is dilated with poor inspiration collapse consistent with elevated right atrial pressure.  EKG 07/25/2016: Sinus rhythm with first-degree AV block, low voltage complexes, PVC.  Frequent.  Ventricle couplets noted.  EKG 06/11/2016: Sinus tachycardia at the rate of 98 bpm, first-degree AV block, cannot exclude inferior infarct old.  Incomplete right branch block.  PVC.  EKG 06/11/2016: Normal sinus rhythm/sinus tachycardia at the rate of 100 bpm, normal axis. No evidence of ischemia, normal EKG. PVC.  Echo: 06/13/2016: Normal LV systolic function. Mitral valve, especially the anterior mitral leaflet is severely thickened and appears myxomatous with  suspicion for vegetation, Measures approximately 1.5-2 cm x 1 cm. The vegetation is fixed. Posterior mitral leaflet is restricted in movement. There is moderate to severe mitral stenosis with a mean gradient of 22 mmHg, mitral valve area of 1.15 cm. Left atrium was moderate to severely dilated, moderate pulmonary hypertension, PA pressure 45 mmHg.  ASSESSMENT AND PLAN:  1. Native valve endocarditis with recurrent stroke.  2. History of minimally invasive mitral valve repair on 11/21/2015 3. Atherosclerosis of native coronary artery of native heart with angina pectoris S/P CABG x 3 on 05/29/2015 withLIMA to LAD, SVG to OM2, SVG to RCA; and Maze procedure and clipping of left atrial appendage. 4. Paroxysmal atrial fibrillation and atrial flutter, S/P Maze procedure and left atrial appendage closure during CABG, presently on Xarelto. Continue the same. CHA2DS2-VASCScore: Risk Score 4.0 Yearly risk of stroke 4%  5. Hypercoagulable state, primary. On long-term anticoagulation with Coumadin. 6. Hypertension 7. Hyperlipidemia  Rec: I am concerned that the stroke is from MV vegetation induced septic emboli and not cardioembolic phenomena from atrial fibrillation.  Over the past few weeks he has maintained sinus rhythm, however atrial fibrillation being paroxysmal could certainly increase the risk of stroke however he has had left atrial appendage clipping during CABG that reduces the risk of thromboembolic complication.  Hence evaluation for endocarditis with the TEE would be more appropriate at this point.  I had a very long discussion with the patient's wife that is extremely difficult situation whether it is thrombotic or septic emboli as a sequelae, both place him at extreme high risk of very poor prognosis.  If it is thrombotic, anticoagulation is an issue due to large cerebellar infarct.  If it is septic emboli, he is definitely not a candidate for redo mitral valve surgery at this point.  I will try  to set up a TEE in the morning if possible and make further recommendation.  With regard to anasarca, this is systemic inflammatory response syndrome, and I do not think he is in florid heart failure.  I have not made any changes to his medications.  Is presently on aspirin for stroke.  Adrian Prows, MD 07/25/2016, 8:54 PM [facility]. [place] Pager: [phone] Office: [phone] If no answer Cell [phone]

## 2016-07-25 NOTE — Progress Notes (Signed)
Modified Barium Swallow Progress Note  Patient Details  Name: Peter Bruce MRN: [id number] Date of Birth: November 24, 1950  Today's Date: 07/25/2016  Modified Barium Swallow completed.  Full report located under Chart Review in the Imaging Section.  Brief recommendations include the following:  Clinical Impression  Patient presents with moderate, multifactorial pharyngeal dysphagia in the setting of acute CVA and recently diagnosed base of tongue cancer. Patient with anatomical changes (suspect 2/2 BOT cancer) and reduced hyolarygneal excursion which impact airway protection and bolus clearance through the UES. This frequently resulted in shallow penetration during the swallow with all liquid consistencies. Patient also has sensory deficits leading to premature spillage with thin and nectar thick liquids when taken in consecutive sips with both sensed and silent aspiration during the swallow. Mild-moderate residue remained in the pyriform sinuses and cp segment after the swallow with all consistencies (thin, nectar and honey-thick liquids) and solids. This residue occasionally spilled into the larygneal vestibule after the swallow. Pt with difficulty following commands for swallow maneuvers or even to take single sips of liquid. Responded well to very basic commands "Sip, swallow, cough, swallow," but suspect he will have difficulty with carryover of this strategy without supervision. Patient was consistently able to clear penetrate with the above cues, and subsequent swallow is noted to reduce residue. Spoke with patient regarding risks of aspiration, which is minimized with use of strategies. He expresses a desire to continue eating and drinking. Recommend dys 3 diet with thin liquid via single cup sip only, with use of hard cough, reswallow after each sip, bite. Will follow up with patient and his wife for education and training in compensatory strategies, diet tolerance.   Swallow Evaluation  Recommendations       SLP Diet Recommendations: Dysphagia 3 (Mech soft) solids;Thin liquid   Liquid Administration via: Cup   Medication Administration: Whole meds with puree   Supervision: Full supervision/cueing for compensatory strategies;Staff to assist with self feeding   Compensations: Minimize environmental distractions;Slow rate;Small sips/bites;Hard cough after swallow;Multiple dry swallows after each bite/sip   Postural Changes: Seated upright at 90 degrees   Oral Care Recommendations: Oral care before and after [place], [place] CF-SLP Speech-Language Pathologist [place] 07/25/2016,6:26 PM

## 2016-07-25 NOTE — Progress Notes (Signed)
[facility] PROGRESS NOTE  Iven Earnhart WJX:[id number] DOB: [birth year] DOA: 07/24/2016   PCP: Adrian Prows, MD     Assessment/Plan: Active Problems:   History of pulmonary embolism   Paroxysmal atrial fibrillation ([place])   Coronary artery disease involving native coronary artery   Lupus anticoagulant positive   Atrial flutter ([place])   Current use of long term anticoagulation   Chronic diastolic CHF (congestive heart failure) ([place])   Essential hypertension   Cancer of base of tongue (HCC)   PMR (polymyalgia rheumatica) (HCC)   Benign essential HTN   Anemia of chronic disease   Thrombocytopenia (HCC)   Bradycardia   Syncope and collapse   Sepsis ([place])   CVA (cerebral vascular accident) ([place])   Paulo Keimig  is a 66 y.o. male   known coronary artery disease and did undergone CABG 3 on 05/29/2015. 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  He was admitted on 06/11/2016 and discharged on 06/17/2016 with mitral valve and apparatus, septic emboli to his brain, on IV antibiotic therapy.  He had  strep viridans endocarditis.  He had been doing well until 3/12   developed anasarca, shortness of breath, fatigue, appetite.  He was admitted with HCAP and cardiology was consulted to see whether any further cardiac workup needs to be performed.  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 Prior to admission, agent also had diarrhea, loss of appetite, not feeling well.his past medical history significant for Paroxysmal atrial fibrillation, history of DVT and PE and has been on long-term anticoagulation, on Xarelto.  Presented 3/17, with unresponsive episode, bradycardia, hypotension, patient underwent temporary pacing, hydrated with IV fluids, brought to the ER, MRI of the brain showed acute infarct of the  inferior left cerebellar hemisphere. Suspected to have severe stenosis of the left vertebral artery. 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 Cardiology, neurology, infectious disease consulted to further evaluate patient's endocarditis, possible septic emboli to the brain, need to restart antibiotics,.  Patient also found to have PVCs, bigeminy, A. fib on telemetry   Assessment and plan   . Cerebellar CVA (cerebral vascular accident) [facility]) -  - neurology Dr. Belenda Cruise has been reconsulted this morning, he has spoken to neurosurgery who had ordered a stat CT prior to transfer to ICU. Currently patient is on aspirin 325, neurology and cardiology to decide on anticoagulation. Patient is on Xarelto for atrial fibrillation. Cardiology, Dr. Einar Gip, will help set up TEE.    Marland Kitchen Anemia of chronic disease - stable chronic will follow, Baseline hemoglobin around 10, hemoglobin 9.9 yesterday  . Cancer of base of tongue ([place]) we'll need to follow up with oncology at the time of discharge  . Bradycardia  -  not on beta blockers or other rate control medications. Currently does not require pacing appreciate cardiology consult.Called Dr. Rock[place] Alexandria, patient currently does not require pacing, bradycardia likely secondary to cerebellar stroke. TSH 3.397  . Syncope and collapse in the setting of severe bradycardia , cerebellar stroke  . Chronic diastolic CHF (congestive heart failure) (HCC) currently appears to be close to euvolemic given new CVA will avoid diuresis and appreciate cardiology input continue to monitor   . Coronary artery disease involving native coronary artery cardiac enzymes slightly elevated, TEE to be performed today   . Sepsis (  [place]) thinking meeting sepsis criteria although doubted patient does have persistent white blood cell count ever since prior admission but right now afebrile bradycardia has resolved. Will obtain blood culture 2, consulted infectious  disease did receive antibiotics need to be continued while TEE is pending. Renal failure seems to have resolved  . Essential hypertension permissive hypertension for today  . History of pulmonary embolism 10 years ago on anticoagulation xarelto on hold per neurology due to recent large CVA  . Lupus anticoagulant positive-  on anticoagulation at baseline oh hold for  now  . Paroxysmal atrial fibrillation (HCC) -           - CHA2DS2 vas score 6: restart  anticoagulation with  xarelto  when clered by Neurology           -  Rate control:  Recent bradycardia have not required any rate control       -      . PMR (polymyalgia rheumatica) (HCC) -  - stable chronic will follow and not on steroids currently  . Thrombocytopenia ([place]) - will need to follow-up with hematology oncology seems to be going persistently down since May 2017   DVT prophylaxsis SCDs  Code Status:  Full code    Family Communication: Discussed in detail with the patient, all imaging results, lab results explained to the patient   Disposition Plan:  Transfer to ICU      Consultants: Neurology Cardiology Infectious disease  Procedures:  None  Antibiotics: Anti-infectives    Start     Dose/Rate Route Frequency Ordered Stop   07/25/16 1000  vancomycin (VANCOCIN) IVPB 750 mg/150 ml premix     750 mg 150 mL/hr over 60 Minutes Intravenous Every 12 hours 07/24/16 2047     07/25/16 0800  ceFEPIme (MAXIPIME) 1 g in dextrose 5 % 50 mL IVPB     1 g 100 mL/hr over 30 Minutes Intravenous Every 12 hours 07/25/16 0342     07/24/16 2030  ceFEPIme (MAXIPIME) 1 g in dextrose 5 % 50 mL IVPB     1 g 100 mL/hr over 30 Minutes Intravenous STAT 07/24/16 2020 07/24/16 2107   07/24/16 2030  vancomycin (VANCOCIN) 1,500 mg in sodium chloride 0.9 % 500 mL IVPB     1,500 mg 250 mL/hr over 120 Minutes Intravenous STAT 07/24/16 2025 07/24/16 2330         HPI/Subjective: Bradycardia seems to have resolved    Objective: Vitals:   07/25/16 0312 07/25/16 0400 07/25/16 0500 07/25/16 0600  BP:  125/69  126/86  Pulse: 88  65 (!) 30  Resp: 20 (!) 24 (!) 24 (!) 31  Temp:  98.8 F (37.1 C)    TempSrc:  Axillary    SpO2: 98% 100% 98% 100%  Weight:  88.9 kg (195 lb 15.8 oz)    Height:  5' 10"  (1.778 m)      Intake/Output Summary (Last 24 hours) at 07/25/16 2725 Last data filed at 07/25/16 0600  Gross per 24 hour  Intake             1280 ml  Output              375 ml  Net              905 ml    Exam:  Examination:  General exam: Appears calm and comfortable  Respiratory system: Clear to auscultation. Respiratory effort normal. Cardiovascular system: S1 & S2 heard, RRR. No JVD, murmurs,  rubs, gallops or clicks. No pedal edema. Gastrointestinal system: Abdomen is nondistended, soft and nontender. No organomegaly or masses felt. Normal bowel sounds heard. Central nervous system: Alert and oriented. No focal neurological deficits. Extremities: Symmetric 5 x 5 power. Skin: No rashes, lesions or ulcers Psychiatry: Judgement and insight appear normal. Mood & affect appropriate.     Data Reviewed: I have personally reviewed following labs and imaging studies  Micro Results Recent Results (from the past 240 hour(s))  Blood culture (routine x 2)     Status: None   Collection Time: 07/19/16 11:56 AM  Result Value Ref Range Status   Specimen Description BLOOD LEFT ANTECUBITAL  Final   Special Requests IN PEDIATRIC BOTTLE 3.5CC  Final   Culture   Final    NO GROWTH 5 DAYS Performed at [facility], [place] [address] [place], [place] [address]    Report Status 07/24/2016 FINAL  Final  Urine culture     Status: None   Collection Time: 07/19/16 11:56 AM  Result Value Ref Range Status   Specimen Description URINE, RANDOM  Final   Special Requests NONE  Final   Culture   Final    NO GROWTH Performed at [facility], [place] [address] [place], [place], [place] [address]    Report Status  07/20/2016 FINAL  Final  Blood culture (routine x 2)     Status: None   Collection Time: 07/19/16 12:03 PM  Result Value Ref Range Status   Specimen Description BLOOD LEFT HAND  Final   Special Requests IN PEDIATRIC BOTTLE 3CC  Final   Culture   Final    NO GROWTH 5 DAYS Performed at [facility], [place] [address], [place], [place] [address]    Report Status 07/24/2016 FINAL  Final  C difficile quick scan w PCR reflex     Status: Abnormal   Collection Time: 07/21/16  6:46 AM  Result Value Ref Range Status   C Diff antigen POSITIVE (A) NEGATIVE Final   C Diff toxin NEGATIVE NEGATIVE Final   C Diff interpretation Results are indeterminate. See PCR results.  Final  Clostridium Difficile by PCR     Status: Abnormal   Collection Time: 07/21/16  6:46 AM  Result Value Ref Range Status   Toxigenic C Difficile by pcr POSITIVE (A) NEGATIVE Final    Comment: Positive for toxigenic C. difficile with little to no toxin production. Only treat if clinical presentation suggests symptomatic illness. Performed at [facility], [place] [address], [place], [place] [address]   MRSA PCR Screening     Status: None   Collection Time: 07/25/16  3:54 AM  Result Value Ref Range Status   MRSA by PCR NEGATIVE NEGATIVE Final    Comment:        The GeneXpert MRSA Assay (FDA approved for NASAL specimens only), is one component of a comprehensive MRSA colonization surveillance program. It is not intended to diagnose MRSA infection nor to guide or monitor treatment for MRSA infections.     Radiology Reports Dg Chest 2 View  Result Date: 07/19/2016 CLINICAL DATA:  Weakness and fatigue EXAM: CHEST  2 VIEW COMPARISON:  June 11, 2016 FINDINGS: There is airspace consolidation in the right lower lobe with right pleural effusion. The left lung is clear. Heart is mildly enlarged with pulmonary venous hypertension. No adenopathy. Central catheter tip is in superior vena cava. Patient is status post  coronary artery bypass grafting. There is a  left atrial appendage clamp. There is degenerative change in the lower thoracic region. There is a small benign appearing cystic area in the right proximal humerus. IMPRESSION: Right base consolidation consistent with pneumonia with right pleural effusion. Left lung clear. There is pulmonary vascular congestion. No interstitial edema. Central catheter tip in superior vena cava. No evident pneumothorax. Electronically Signed   By: Lowella Grip III M.D.   On: 07/19/2016 10:23   Ct Head Wo Contrast  Result Date: 07/24/2016 CLINICAL DATA:  Altered mental status. Patient woke up screening in then was unresponsive. Left the hospital yesterday with diagnosis of Clostridium difficile. EXAM: CT HEAD WITHOUT CONTRAST TECHNIQUE: Contiguous axial images were obtained from the base of the skull through the vertex without intravenous contrast. COMPARISON:  MRI brain 06/12/2016.  CT head 06/11/2016. FINDINGS: Brain: Focal low-attenuation areas demonstrated in the left anterior temporal lobe and in the left inferior cerebellar hemisphere, progressing since previous study, suggesting acute ischemic process. The left temporal changes were demonstrated on the previous MRI but the cerebellar changes appear to be new since that time. There is mild mass effect in the left posterior fossa with mild displacement and effacement of the fourth ventricle. 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 Diffuse cerebral atrophy. Ventricular dilatation consistent with central atrophy. Low-attenuation in the deep white matter consistent with small vessel ischemia. No midline shift. No abnormal extra-axial fluid collections. Gray-white matter junctions are distinct. Basal cisterns are not effaced. No acute intracranial hemorrhage. Vascular: Vascular calcifications are present in the carotid siphons and vertebrobasilar arteries peer Skull: Calvarium appears intact. Sinuses/Orbits: Paranasal sinuses and mastoid  air cells are clear. Other: None. IMPRESSION: 1. Focal low-attenuation in the left anterior temporal lobe consistent with progressing infarct in the distribution of the left middle cerebral artery and as previously seen on MRI. 2. New area of low-attenuation in the left inferior cerebellar hemisphere with associated mass effect is consistent with acute infarct in the distribution of the left posterior inferior cerebral artery. This was not present on prior MRI, suggesting a new interval process. 3. Chronic atrophy and small vessel ischemic changes. No acute intracranial hemorrhage. Electronically Signed   By: Lucienne Capers M.D.   On: 07/24/2016 23:51   Mr Jodene Nam Head Wo Contrast  Result Date: 07/25/2016 CLINICAL DATA:  History of intracranial septic emboli. Altered mental status with speech disturbance. EXAM: MRI HEAD WITHOUT CONTRAST MRA HEAD WITHOUT CONTRAST TECHNIQUE: Multiplanar, multiecho pulse sequences of the brain and surrounding structures were obtained without intravenous contrast. Angiographic images of the head were obtained using MRA technique without contrast. COMPARISON:  Brain MRI 12/30/2016 FINDINGS: MRI HEAD FINDINGS Brain: There is diffusion restriction throughout the lower left cerebellum, in the distribution of the left posterior inferior cerebellar artery. There are multiple other areas of high signal on diffusion-weighted imaging in the posterior right temporal lobe and lateral left temporal lobe, as well as in the periatrial white matter of the right parietal lobe. ADC values at these locations are relatively normal, likely indicating that these are areas of subacute ischemia. There is hyperintense T2 weighted signal within the left cerebellum, left occipital lobe, left temporal lobe and within the periventricular white matter. A No mass lesion or midline shift. No hydrocephalus or extra-axial fluid collection. The midline structures are normal. No age advanced or lobar predominant  atrophy. Vascular: Major intracranial arterial and venous sinus flow voids are preserved. Two foci of punctate chronic microhemorrhage of the left basal ganglia. Skull and upper cervical spine: The visualized skull base, calvarium,  upper cervical spine and extracranial soft tissues are normal. Sinuses/Orbits: No fluid levels or advanced mucosal thickening. No mastoid effusion. Normal orbits. MRA HEAD FINDINGS Intracranial internal carotid arteries: Normal. Anterior cerebral arteries: Congenitally absent left A1 segment. Otherwise normal. Middle cerebral arteries: Normal. Posterior communicating arteries: Absent bilaterally. Posterior cerebral arteries: Normal. Basilar artery: Normal. Vertebral arteries: Right-dominant. There is minimal flow related enhancement within the left vertebral artery. Superior cerebellar arteries: Normal. Anterior inferior cerebellar arteries: Normal. Posterior inferior cerebellar arteries: Normal on the right. Left PICA is not visualized and likely occluded. IMPRESSION: 1. 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 No associated acute hemorrhage or mass effect. 2. Scattered areas of DWI abnormality in the left temporal lobe, right temporal lobe and right parietal lobe are favored to be areas of subacute ischemia. The distribution is again suggestive of a central embolic process, though likely occurring at a different time from the current acute left PICA infarct. 3. 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 Electronically Signed   By: Ulyses Jarred M.D.   On: 07/25/2016 03:15   Mr Brain Wo Contrast  Result Date: 07/25/2016 CLINICAL DATA:  History of intracranial septic emboli. Altered mental status with speech disturbance. EXAM: MRI HEAD WITHOUT CONTRAST MRA HEAD WITHOUT CONTRAST TECHNIQUE: Multiplanar, multiecho pulse sequences of the brain and surrounding structures were obtained  without intravenous contrast. Angiographic images of the head were obtained using MRA technique without contrast. COMPARISON:  Brain MRI 12/30/2016 FINDINGS: MRI HEAD FINDINGS Brain: There is diffusion restriction throughout the lower left cerebellum, in the distribution of the left posterior inferior cerebellar artery. There are multiple other areas of high signal on diffusion-weighted imaging in the posterior right temporal lobe and lateral left temporal lobe, as well as in the periatrial white matter of the right parietal lobe. ADC values at these locations are relatively normal, likely indicating that these are areas of subacute ischemia. There is hyperintense T2 weighted signal within the left cerebellum, left occipital lobe, left temporal lobe and within the periventricular white matter. A No mass lesion or midline shift. No hydrocephalus or extra-axial fluid collection. The midline structures are normal. No age advanced or lobar predominant atrophy. Vascular: Major intracranial arterial and venous sinus flow voids are preserved. Two foci of punctate chronic microhemorrhage of the left basal ganglia. Skull and upper cervical spine: The visualized skull base, calvarium, upper cervical spine and extracranial soft tissues are normal. Sinuses/Orbits: No fluid levels or advanced mucosal thickening. No mastoid effusion. Normal orbits. MRA HEAD FINDINGS Intracranial internal carotid arteries: Normal. Anterior cerebral arteries: Congenitally absent left A1 segment. Otherwise normal. Middle cerebral arteries: Normal. Posterior communicating arteries: Absent bilaterally. Posterior cerebral arteries: Normal. Basilar artery: Normal. Vertebral arteries: Right-dominant. There is minimal flow related enhancement within the left vertebral artery. Superior cerebellar arteries: Normal. Anterior inferior cerebellar arteries: Normal. Posterior inferior cerebellar arteries: Normal on the right. Left PICA is not visualized and  likely occluded. IMPRESSION: 1. 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 No associated acute hemorrhage or mass effect. 2. Scattered areas of DWI abnormality in the left temporal lobe, right temporal lobe and right parietal lobe are favored to be areas of subacute ischemia. The distribution is again suggestive of a central embolic process, though likely occurring at a different time from the current acute left PICA infarct. 3. 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 Electronically  Signed   By: Ulyses Jarred M.D.   On: 07/25/2016 03:15   US Scrotum  Result Date: 07/20/2016 CLINICAL DATA:  One week history of scrotal pain EXAM: SCROTAL ULTRASOUND DOPPLER ULTRASOUND OF THE TESTICLES TECHNIQUE: Complete ultrasound examination of the testicles, epididymis, and other scrotal structures was performed. Color and spectral Doppler ultrasound were also utilized to evaluate blood flow to the testicles. COMPARISON:  None. FINDINGS: Right testicle Measurements: 5.2 x 4.0 x 3.7 cm. The testicular parenchyma is diffusely inhomogeneous with areas of relative hyperechoic and hypoechoic parenchyma. No well-defined mass seen. No hyperemia seen on the right. Left testicle Measurements: 4.9 x 3.8 x 3.8 cm. Testicular parenchyma is diffusely inhomogeneous with areas of relative hyperechoic and hypoechoic testicular parenchyma. Appearance is similar to the contralateral side. No well-defined mass or hyperemia seen on the left. Right epididymis:  Normal in size and appearance. Left epididymis:  Normal in size and appearance. Hydrocele:  Moderate hydroceles bilaterally. Varicocele: Small varicocele on the left. No varicocele on the right. Pulsed Doppler interrogation of both testes demonstrates normal low resistance arterial and venous waveforms bilaterally. No scrotal abscess or scrotal wall thickening. IMPRESSION: Diffusely  inhomogeneous testicular parenchyma bilaterally without well-defined mass on either side. No hyperemia. This appearance raises concern for infiltrative type lesions within each testis. 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 More infiltrative type lesions such is extraosseous multiple myeloma or lymphoma can involve both testes simultaneously and must be of concern. The lack of hyperemia makes bilateral orchitis seen less likely, although the inhomogeneous appearance of the testicular parenchyma could be due to infectious etiology. Moderate hydroceles bilaterally. No extratesticular mass.  No torsion on either side. These results will be called to the ordering clinician or representative by the Radiologist Assistant, and communication documented in the PACS or zVision Dashboard. Electronically Signed   By: Lowella Grip III M.D.   On: 07/20/2016 16:32   US Renal  Result Date: 07/20/2016 CLINICAL DATA:  Elevated creatinine and BUN, history of kidney stones, hypertension EXAM: RENAL / URINARY TRACT ULTRASOUND COMPLETE COMPARISON:  CT abdomen pelvis of 11/12/2015 FINDINGS: Right Kidney: Length: 11.4 cm. No hydronephrosis is seen. No renal calculi are evident. Left Kidney: Length: 11.2 cm. No hydronephrosis is seen. There is a nonobstructing calculus in the mid lower kidney of 7 mm in diameter. Also, there is a rounded structure which is hypoechogenic with some internal echoes. No blood flow is demonstrated and there is some through transmission. This probably represents a complex cyst, but further assessment with CT or preferably MRI would be recommended. Bladder: The urinary bladder is unremarkable other than slight prominence of the prostate indenting the posterior urinary bladder. IMPRESSION: 1. Nonobstructing left renal calculus of 7 mm in diameter. 2. Probable complex cyst in the mid left kidney but further assessment with CT or preferably MRI would be recommended. 3. Prominent prostate.  Electronically Signed   By: Ivar Drape M.D.   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 EXAM: NUCLEAR MEDICINE PET SKULL BASE TO THIGH TECHNIQUE: 13.1 mCi F-18 FDG was injected intravenously. Full-ring PET imaging was performed from the skull base to thigh after the radiotracer. CT data was obtained and used for attenuation correction and anatomic localization. FASTING BLOOD GLUCOSE:  Value: 70 mg/dl COMPARISON:  None. FINDINGS: NECK Along the right posterior tongue base and extending into the pharyngoepiglottic fold and  epiglottis on the right side, and also into the glossoepiglottic fold and crossing the midline, there is a ill-defined mass lesion with maximum standard uptake value 12.4. This extends along the upper margin of the hyoid bone but is not obviously destroying the hyoid bone. Along the anterior margin the right sternocleidomastoid muscle there is an ill-defined focus of hypermetabolic activity corresponding to a station IIa node of approximately 1.1 cm in short axis on image 31/3, maximum SUV 12.4. A larger low-density node above this in station IIa measures 2.1 cm in short axis on image 25/3 but has only low-grade metabolic activity with maximum SUV of about 5.0. A left-sided station IIa lymph node measuring 1.2 cm in short axis on image 36/3 has a maximum standard uptake value of 6.2. This is right about at the level of the hyoid. A left level III lymph node measuring 1.4 cm on image 44/3 has a maximum SUV of 5.9. CHEST Faintly accentuated metabolic activity along coronary stent, thought to be incidental. No hypermetabolic adenopathy in the chest. Mild cardiomegaly noted. Prosthetic mitral valve. Prior CABG. Coronary, aortic arch, and branch vessel atherosclerotic vascular disease. Moderate right and small left pleural effusion with associated passive  atelectasis. There are scattered mildly prominent lymph nodes observed in the mediastinum with regard to size, including a 1.5 cm right lower paratracheal node on image 82 of series 3. These have relatively low standard uptake value, this paratracheal node has a maximum SUV of only 2.3. Background mediastinal blood pool activity has an SUV of 2.6. There appears to be a small loculated component of the right pleural effusion extending into the space between the right pectoralis major and minor in the third intercostal space. Numbering of the intercostal spaces is tricky as the first and second ribs are fused along parts of their length bilaterally. I suspect this appearance could be due to a prior thoracotomy. ABDOMEN/PELVIS No abnormal hypermetabolic activity within the liver, pancreas, adrenal glands, or spleen. No hypermetabolic lymph nodes in the abdomen or pelvis. Cholelithiasis. Left mid kidney 7 mm nonobstructive renal calculus. There several punctate 2-3 mm right renal calculi. No hydronephrosis or hydroureter. Descending and sigmoid colon diverticulosis. Enlarged prostate gland indents the bladder base. SKELETON Small focus of hypermetabolic activity along the posterior margin of the L5 vertebra just to left of midline, conceivably this could be inflammatory or due to an active Schmorl's node, I suspect that there is an adjacent disc protrusion. Maximum SUV of this small focal lesion is 6.6. I am skeptical of a small metastatic lesion although strictly speaking a small metastatic lesion is not laterally excluded. There is however considerable lumbar spondylosis and some degree of scoliosis with anterolisthesis at the L4-5 level and suspected multilevel impingement. IMPRESSION: 1. 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 There is associated hypermetabolic bilateral  station IIa and left station III adenopathy. 2. There is a small focus of hypermetabolic activity along the posterior inferior endplate of L5. I suspect that this is probably due to an active Schmorl's node given the adjacent suspected disc protrusion. Strictly speaking, a small bony metastatic lesion, while seemingly unlikely, is not readily excluded. Given the degree of lumbar spondylosis, scoliosis, and degenerative disc disease, lumbar spine MRI should be considered both to rule out osseous metastatic disease and also characterize the degree of impingement. 3. Other imaging findings of potential clinical significance: Mild cardiomegaly. Coronary, aortic  arch, and branch vessel atherosclerotic vascular disease. Prior CABG and prosthetic mitral valve. Bilateral pleural effusions. Small loculated component of the right pleural effusion extends and the right third intercostal space in between the pectoralis muscles, possibly related to a prior thoracotomy. Cholelithiasis. Bilateral nonobstructive nephrolithiasis. Enlarged prostate gland. Descending and sigmoid colon diverticulosis. Electronically Signed   By: Van Clines M.D.   On: 07/06/2016 14:46   [place] Art/ven Flow Abd Pelv Doppler  Result Date: 07/20/2016 CLINICAL DATA:  One week history of scrotal pain EXAM: SCROTAL ULTRASOUND DOPPLER ULTRASOUND OF THE TESTICLES TECHNIQUE: Complete ultrasound examination of the testicles, epididymis, and other scrotal structures was performed. Color and spectral Doppler ultrasound were also utilized to evaluate blood flow to the testicles. COMPARISON:  None. FINDINGS: Right testicle Measurements: 5.2 x 4.0 x 3.7 cm. The testicular parenchyma is diffusely inhomogeneous with areas of relative hyperechoic and hypoechoic parenchyma. No well-defined mass seen. No hyperemia seen on the right. Left testicle Measurements: 4.9 x 3.8 x 3.8 cm. Testicular parenchyma is diffusely inhomogeneous with areas of relative hyperechoic  and hypoechoic testicular parenchyma. Appearance is similar to the contralateral side. No well-defined mass or hyperemia seen on the left. Right epididymis:  Normal in size and appearance. Left epididymis:  Normal in size and appearance. Hydrocele:  Moderate hydroceles bilaterally. Varicocele: Small varicocele on the left. No varicocele on the right. Pulsed Doppler interrogation of both testes demonstrates normal low resistance arterial and venous waveforms bilaterally. No scrotal abscess or scrotal wall thickening. IMPRESSION: Diffusely inhomogeneous testicular parenchyma bilaterally without well-defined mass on either side. No hyperemia. This appearance raises concern for infiltrative type lesions within each testis. 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 More infiltrative type lesions such is extraosseous multiple myeloma or lymphoma can involve both testes simultaneously and must be of concern. The lack of hyperemia makes bilateral orchitis seen less likely, although the inhomogeneous appearance of the testicular parenchyma could be due to infectious etiology. Moderate hydroceles bilaterally. No extratesticular mass.  No torsion on either side. These results will be called to the ordering clinician or representative by the Radiologist Assistant, and communication documented in the PACS or zVision Dashboard. Electronically Signed   By: Lowella Grip III M.D.   On: 07/20/2016 16:32   Dg Chest Portable 1 View  Result Date: 07/24/2016 CLINICAL DATA:  66 year old male with altered mental status EXAM: PORTABLE CHEST 1 VIEW COMPARISON:  Chest radiograph dated 07/19/2016 FINDINGS: There has been interval removal of the right-sided PICC. There is stable cardiomegaly. Mechanical cardiac valve and left atrial appendage occlusion device noted. There is mild vascular congestion. There has been significant interval improvement of the previously seen right pleural effusion and right lung base airspace  densities. Small amount of pleural effusion and associated atelectasis versus infiltrate remains. The left lung is clear. There is no pneumothorax. Median sternotomy wires noted. No acute osseous pathology. IMPRESSION: 1. Interval improvement of the previously seen right-sided pleural effusion and right lung base airspace densities with minimal amount of residual pleural effusion and associated right lung base atelectasis/infiltrate. 2. Stable cardiomegaly with probable mild vascular congestion. No pulmonary edema. Electronically Signed   By: Anner Crete M.D.   On: 07/24/2016 20:23     CBC  Recent Labs Lab 07/19/16 1016 07/20/16 0353 07/21/16 0512 07/22/16 0429 07/23/16 0546 07/24/16 2008 07/24/16 2018  WBC 27.8* 27.4* 26.4* 22.3* 18.1* 18.6*  --   HGB 10.3* 9.8* 10.0* 9.7* 9.5* 9.6* 9.9*  HCT 31.1* 29.5* 31.1* 30.8* 29.6* 30.0* 29.0*  PLT 72* 71* 74* 77* 68* 72*  --   MCV 94.5 94.6 93.7 94.2 94.0 95.5  --   MCH 31.3 31.4 30.1 29.7 30.2 30.6  --   MCHC 33.1 33.2 32.2 31.5 32.1 32.0  --   RDW 16.7* 17.2* 17.6* 17.8* 18.1* 17.8*  --   LYMPHSABS 1.7  --   --   --  1.0 1.6  --   MONOABS 1.7*  --   --   --  1.8* 1.1*  --   EOSABS 0.0  --   --   --  0.7 0.3  --   BASOSABS 0.0  --   --   --  0.1 0.1  --     Chemistries   Recent Labs Lab 07/19/16 1016 07/20/16 0353 07/21/16 0512 07/22/16 0429 07/23/16 0130 07/23/16 0546 07/24/16 2008 07/24/16 2018  NA 134* 139 134*  --  134* 133* 136 137  K 4.3 4.0 3.7  --  3.6 3.5 4.2 4.1  CL 101 106 102  --  102 102 104 105  CO2 25 25 23   --  25 26 24   --   GLUCOSE 125* 108* 104*  --  113* 131* 124* 119*  BUN 43* 42* 43*  --  41* 40* 36* 36*  CREATININE 2.32* 2.27* 2.31* 2.31* 2.17* 1.97* 1.66* 1.60*  CALCIUM 8.6* 8.4* 8.2*  --  8.1* 8.0* 8.0*  --   MG  --   --   --   --  2.2  --  2.0  --   AST 237*  --  93*  --   --   --  44*  --   ALT 358*  --  232*  --   --   --  94*  --   ALKPHOS 81  --  67  --   --   --  46  --   BILITOT 0.9   --  1.3*  --   --   --  1.0  --    ------------------------------------------------------------------------------------------------------------------ estimated creatinine clearance is 50.3 mL/min (A) (by C-G formula based on SCr of 1.6 mg/dL (H)). ------------------------------------------------------------------------------------------------------------------ No results for input(s): HGBA1C in the last 72 hours. ------------------------------------------------------------------------------------------------------------------ No results for input(s): CHOL, HDL, LDLCALC, TRIG, CHOLHDL, LDLDIRECT in the last 72 hours. ------------------------------------------------------------------------------------------------------------------  Recent Labs  07/24/16 2249  TSH 3.397   ------------------------------------------------------------------------------------------------------------------ No results for input(s): VITAMINB12, FOLATE, FERRITIN, TIBC, IRON, RETICCTPCT in the last 72 hours.  Coagulation profile  Recent Labs Lab 07/19/16 1608  INR 1.55    No results for input(s): DDIMER in the last 72 hours.  Cardiac Enzymes  Recent Labs Lab 07/23/16 0704 07/23/16 1307 07/25/16 0014  TROPONINI 0.11* 0.10* 0.08*   ------------------------------------------------------------------------------------------------------------------ Invalid input(s): POCBNP   CBG: No results for input(s): GLUCAP in the last 168 hours.     Studies: Ct Head Wo Contrast  Result Date: 07/24/2016 CLINICAL DATA:  Altered mental status. Patient woke up screening in then was unresponsive. Left the hospital yesterday with diagnosis of Clostridium difficile. EXAM: CT HEAD WITHOUT CONTRAST TECHNIQUE: Contiguous axial images were obtained from the base of the skull through the vertex without intravenous contrast. COMPARISON:  MRI brain 06/12/2016.  CT head 06/11/2016. FINDINGS: Brain: Focal low-attenuation areas  demonstrated in the left anterior temporal lobe and in the left inferior cerebellar hemisphere, progressing since previous study, suggesting acute ischemic process. The left temporal changes were demonstrated on the previous MRI but the cerebellar changes appear to be new since  that time. There is mild mass effect in the left posterior fossa with mild displacement and effacement of the fourth ventricle. 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 Diffuse cerebral atrophy. Ventricular dilatation consistent with central atrophy. Low-attenuation in the deep white matter consistent with small vessel ischemia. No midline shift. No abnormal extra-axial fluid collections. Gray-white matter junctions are distinct. Basal cisterns are not effaced. No acute intracranial hemorrhage. Vascular: Vascular calcifications are present in the carotid siphons and vertebrobasilar arteries peer Skull: Calvarium appears intact. Sinuses/Orbits: Paranasal sinuses and mastoid air cells are clear. Other: None. IMPRESSION: 1. Focal low-attenuation in the left anterior temporal lobe consistent with progressing infarct in the distribution of the left middle cerebral artery and as previously seen on MRI. 2. New area of low-attenuation in the left inferior cerebellar hemisphere with associated mass effect is consistent with acute infarct in the distribution of the left posterior inferior cerebral artery. This was not present on prior MRI, suggesting a new interval process. 3. Chronic atrophy and small vessel ischemic changes. No acute intracranial hemorrhage. Electronically Signed   By: Lucienne Capers M.D.   On: 07/24/2016 23:51   Mr Jodene Nam Head Wo Contrast  Result Date: 07/25/2016 CLINICAL DATA:  History of intracranial septic emboli. Altered mental status with speech disturbance. EXAM: MRI HEAD WITHOUT CONTRAST MRA HEAD WITHOUT CONTRAST TECHNIQUE: Multiplanar, multiecho pulse sequences of the brain and surrounding structures were obtained without  intravenous contrast. Angiographic images of the head were obtained using MRA technique without contrast. COMPARISON:  Brain MRI 12/30/2016 FINDINGS: MRI HEAD FINDINGS Brain: There is diffusion restriction throughout the lower left cerebellum, in the distribution of the left posterior inferior cerebellar artery. There are multiple other areas of high signal on diffusion-weighted imaging in the posterior right temporal lobe and lateral left temporal lobe, as well as in the periatrial white matter of the right parietal lobe. ADC values at these locations are relatively normal, likely indicating that these are areas of subacute ischemia. There is hyperintense T2 weighted signal within the left cerebellum, left occipital lobe, left temporal lobe and within the periventricular white matter. A No mass lesion or midline shift. No hydrocephalus or extra-axial fluid collection. The midline structures are normal. No age advanced or lobar predominant atrophy. Vascular: Major intracranial arterial and venous sinus flow voids are preserved. Two foci of punctate chronic microhemorrhage of the left basal ganglia. Skull and upper cervical spine: The visualized skull base, calvarium, upper cervical spine and extracranial soft tissues are normal. Sinuses/Orbits: No fluid levels or advanced mucosal thickening. No mastoid effusion. Normal orbits. MRA HEAD FINDINGS Intracranial internal carotid arteries: Normal. Anterior cerebral arteries: Congenitally absent left A1 segment. Otherwise normal. Middle cerebral arteries: Normal. Posterior communicating arteries: Absent bilaterally. Posterior cerebral arteries: Normal. Basilar artery: Normal. Vertebral arteries: Right-dominant. There is minimal flow related enhancement within the left vertebral artery. Superior cerebellar arteries: Normal. Anterior inferior cerebellar arteries: Normal. Posterior inferior cerebellar arteries: Normal on the right. Left PICA is not visualized and likely  occluded. IMPRESSION: 1. 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 No associated acute hemorrhage or mass effect. 2. Scattered areas of DWI abnormality in the left temporal lobe, right temporal lobe and right parietal lobe are favored to be areas of subacute ischemia. The distribution is again suggestive of a central embolic process, though likely occurring at a different time from the current acute left PICA infarct. 3. 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 Electronically Signed  By: Ulyses Jarred M.D.   On: 07/25/2016 03:15   Mr Brain Wo Contrast  Result Date: 07/25/2016 CLINICAL DATA:  History of intracranial septic emboli. Altered mental status with speech disturbance. EXAM: MRI HEAD WITHOUT CONTRAST MRA HEAD WITHOUT CONTRAST TECHNIQUE: Multiplanar, multiecho pulse sequences of the brain and surrounding structures were obtained without intravenous contrast. Angiographic images of the head were obtained using MRA technique without contrast. COMPARISON:  Brain MRI 12/30/2016 FINDINGS: MRI HEAD FINDINGS Brain: There is diffusion restriction throughout the lower left cerebellum, in the distribution of the left posterior inferior cerebellar artery. There are multiple other areas of high signal on diffusion-weighted imaging in the posterior right temporal lobe and lateral left temporal lobe, as well as in the periatrial white matter of the right parietal lobe. ADC values at these locations are relatively normal, likely indicating that these are areas of subacute ischemia. There is hyperintense T2 weighted signal within the left cerebellum, left occipital lobe, left temporal lobe and within the periventricular white matter. A No mass lesion or midline shift. No hydrocephalus or extra-axial fluid collection. The midline structures are normal. No age advanced or lobar predominant atrophy. Vascular: Major  intracranial arterial and venous sinus flow voids are preserved. Two foci of punctate chronic microhemorrhage of the left basal ganglia. Skull and upper cervical spine: The visualized skull base, calvarium, upper cervical spine and extracranial soft tissues are normal. Sinuses/Orbits: No fluid levels or advanced mucosal thickening. No mastoid effusion. Normal orbits. MRA HEAD FINDINGS Intracranial internal carotid arteries: Normal. Anterior cerebral arteries: Congenitally absent left A1 segment. Otherwise normal. Middle cerebral arteries: Normal. Posterior communicating arteries: Absent bilaterally. Posterior cerebral arteries: Normal. Basilar artery: Normal. Vertebral arteries: Right-dominant. There is minimal flow related enhancement within the left vertebral artery. Superior cerebellar arteries: Normal. Anterior inferior cerebellar arteries: Normal. Posterior inferior cerebellar arteries: Normal on the right. Left PICA is not visualized and likely occluded. IMPRESSION: 1. 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 No associated acute hemorrhage or mass effect. 2. Scattered areas of DWI abnormality in the left temporal lobe, right temporal lobe and right parietal lobe are favored to be areas of subacute ischemia. The distribution is again suggestive of a central embolic process, though likely occurring at a different time from the current acute left PICA infarct. 3. 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 Electronically Signed   By: Ulyses Jarred M.D.   On: 07/25/2016 03:15   Dg Chest Portable 1 View  Result Date: 07/24/2016 CLINICAL DATA:  66 year old male with altered mental status EXAM: PORTABLE CHEST 1 VIEW COMPARISON:  Chest radiograph dated 07/19/2016 FINDINGS: There has been interval removal of the right-sided PICC. There is stable cardiomegaly. Mechanical cardiac valve and left atrial appendage  occlusion device noted. There is mild vascular congestion. There has been significant interval improvement of the previously seen right pleural effusion and right lung base airspace densities. Small amount of pleural effusion and associated atelectasis versus infiltrate remains. The left lung is clear. There is no pneumothorax. Median sternotomy wires noted. No acute osseous pathology. IMPRESSION: 1. Interval improvement of the previously seen right-sided pleural effusion and right lung base airspace densities with minimal amount of residual pleural effusion and associated right lung base atelectasis/infiltrate. 2. Stable cardiomegaly with probable mild vascular congestion. No pulmonary edema. Electronically Signed   By: Anner Crete M.D.   On: 07/24/2016 20:23      Lab Results  Component Value  Date   HGBA1C 5.7 (H) 06/14/2016   HGBA1C 5.6 11/19/2015   HGBA1C 5.5 05/27/2015   Lab Results  Component Value Date   LDLCALC 119 (H) 06/14/2016   CREATININE 1.60 (H) 07/24/2016       Scheduled Meds: .  stroke: mapping our early stages of recovery book   Does not apply Once  . aspirin  300 mg Rectal Daily   Or  . aspirin  325 mg Oral Daily  . ceFEPime (MAXIPIME) IV  1 g Intravenous Q12H  . magnesium sulfate 1 - 4 g bolus IVPB  2 g Intravenous Once  . mouth rinse  15 mL Mouth Rinse BID  . vancomycin  750 mg Intravenous Q12H   Continuous Infusions: . sodium chloride 75 mL/hr at 07/25/16 0336     LOS: 0 days    Time spent: >30 MINS    [facility]  [facility] Pager [phone]. If 7PM-7AM, please contact night-coverage at [email or web address], password [facility] 07/25/2016, 8:21 AM  LOS: 0 days

## 2016-07-25 NOTE — ED Notes (Signed)
Pt returned from mri   Resting but answering questons

## 2016-07-25 NOTE — ED Notes (Signed)
The pt did not the day of the week   He guessed the month and he knew who the president was

## 2016-07-25 NOTE — Progress Notes (Signed)
Contacted wife and gave her patients new location.

## 2016-07-25 NOTE — Progress Notes (Signed)
Report called to Traci on 4N.

## 2016-07-25 NOTE — Progress Notes (Signed)
Patient having JVD, short bouts of agonal breathing, HR with frequent PVC's, and swelling in bilateral lower extremities. Paged Night coverage with [facility] for foley catheter in presence of genital swelling and retention of 413. Foley order received. Paged Schorr with information about condition.   Milford Cage, RN

## 2016-07-25 NOTE — ED Notes (Signed)
Report called to rn on 4e 

## 2016-07-25 NOTE — Progress Notes (Signed)
Patient ID: Peter Bruce, male   DOB: 25-May-1950, 66 y.o.   MRN: [id number] I was asked to review MRI in this patient with an acute left cerebellar infarct.  Repeat CT shows minimal evidence of swelling, 4th ventricle patent and no hydrocephalus.  No neurosurgical intervention needed at this time, consult as needed if patient deteriorates and/or shows evidence of hydro/posterior fossa swelling.

## 2016-07-25 NOTE — Consult Note (Signed)
Neurology Consultation Reason for Consult: Cerebellar stroke Referring Physician: Roel Cluck, A  CC: Syncope  History is obtained from: Patient  HPI: Peter Bruce is a 66 y.o. male with a history of recent diagnosis of endocarditis who screamed to be unresponsive. He was groaning on EMS arrival noted to be bradycardic in the 30s blood pressures in the 50s. He was given some fluids and improved. He did not come back completely to baseline and therefore CT was performed which demonstrates a fairly large cerebellar infarct.  Speaking with the patient and his wife, he did not need a walker prior to going to the hospital most recently. He has needed a walker since leaving the hospital yesterday. He has had trouble walking and slurred speech for a couple of days but his wife states that slurred speech seems to come and go and him and therefore not much was thought of it. His difficulty walking has been initiated to generalized deconditioning and weakness.  Of note, he missed his rivaroxaban last night due to being exhausted and tonight because he was in the emergency room.  ROS: A 14 point ROS was performed and is negative except as noted in the HPI.   Past Medical History:  Diagnosis Date  . Acute CHF (congestive heart failure) ([place]) 11/01/2015  . Acute on chronic diastolic heart failure ([place]) 09/27/2015  . Acute pulmonary embolus ([place]) 2010  . Acute respiratory failure with hypoxia ([place]) 09/09/2015  . Arthritis   . Atrial fibrillation [facility])    s/p inpatient DCCV 09/11/2015  . Atrial flutter ([place])   . CHF (congestive heart failure) ([place])   . Coronary artery disease involving native coronary artery 05/15/2015   multivessel  . Dysrhythmia    A-fib  . Essential hypertension   . Fibromyalgia   . History of kidney stones   . Hypercholesterolemia   . Hypertension   . Lupus anticoagulant positive 07/29/2008  . Mitral regurgitation 10/30/2015  . PMR (polymyalgia rheumatica) (HCC)   . Pneumonia   . S/P  CABG x 3 05/29/2015   LIMA to LAD, SVG to OM2, SVG to RCA, EVH via bilateral thighs and right lower leg  . S/P Maze operation for atrial fibrillation 05/29/2015   Complete bilateral atrial lesion set via median sternotomy using bipolar radiofrequency and cryothermy ablation with clipping of LA appendage  . 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 Cancer Mother   . Other Sister     chf  . Other Sister     Rosalee Kaufman     Social History:  reports that he has never smoked. He has never used smokeless tobacco. He reports that he does not drink alcohol or use drugs.   Exam: Current vital signs: BP (!) 137/108   Pulse 80   Temp 99.7 F (37.6 C) (Rectal)   Resp (!) 26   Ht 5\' 10"  (1.778 m)   Wt 86.2 kg (190 lb)   SpO2 100%   BMI 27.26 kg/m  Vital signs in last 24 hours: Temp:  [99.7 F (37.6 C)] 99.7 F (37.6 C) (03/17 2015) Pulse Rate:  [60-83] 80 (03/17 2345) Resp:  [18-26] 26 (03/18 0100) BP: (112-190)/(70-168) 137/108 (03/18 0100) SpO2:  [89 %-100 %] 100 % (  03/17 2345) Weight:  [86.2 kg (190 lb)] 86.2 kg (190 lb) (03/17 1948)   Physical Exam  Constitutional: Appears well-developed and well-nourished.  Psych: Affect appropriate to situation Eyes: No scleral injection HENT: No OP obstrucion Head: Normocephalic.  Cardiovascular: Normal rate and regular rhythm.  Respiratory: Effort normal and breath sounds normal to anterior ascultation GI: Soft.  No distension. There is no tenderness.  Skin: WDI  Neuro: Mental Status: Patient is awake, alert, oriented to person, but does not give the correct month. No signs of aphasia or neglect Cranial Nerves: II: Visual Fields are full. Pupils are equal, round,  and reactive to light.   III,IV, VI: EOMI without ptosis or diploplia. He does not have much nystagmus if any. V: Facial sensation is symmetric to temperature VII: I question mild flattening of his right nasolabial fold, but with smile and appears symmetric VIII: hearing is intact to voice X: Uvula elevates symmetrically XI: Shoulder shrug is symmetric. XII: tongue is midline without atrophy or fasciculations.  Motor: Tone is normal. Bulk is normal. 5/5 strength was present in all four extremities.  Sensory: Sensation is symmetric to light touch and temperature in the arms and legs. Cerebellar: He is slow on finger-nose-finger bilaterally, but not markedly different between the 2 sides, possible mild dysmetria on the left but less than I would expect for the size of his infarct. He is also able to perform heel-knee-shin fairly well.  I have reviewed labs in epic and the results pertinent to this consultation are: Elevated creatinine  I have reviewed the images obtained: CT head-cerebellar infarct this was performed about 5 hours after syncopal event  Impression: 66 year old male with left cerebellar infarct in the setting of recent bacterial endocarditis and atrial fibrillation. The onset is unclear, and I do not think that the episode of bradycardia and hypotension is definitely related to a neurological cause. I do not think that this is an atherosclerotic-related stroke and don't think he needs LDL or A1c repeated.  Recommendations: 1) ASA 325 mg daily, following MRI may consider restarting anticoagulation 2) echocardiogram 3) PT, OT 4) stroke team to follow  Roland Rack, MD [facility] [phone]  If 7pm- 7am, please page neurology on call as listed in [place].

## 2016-07-26 ENCOUNTER — Inpatient Hospital Stay (HOSPITAL_COMMUNITY): Payer: Medicare Other

## 2016-07-26 ENCOUNTER — Encounter (HOSPITAL_COMMUNITY): Payer: Self-pay | Admitting: *Deleted

## 2016-07-26 ENCOUNTER — Encounter (HOSPITAL_COMMUNITY)
Admission: EM | Disposition: A | Discharge status: Skilled Nursing Facility | Payer: Self-pay | Source: Home / Self Care | Attending: Neurology

## 2016-07-26 ENCOUNTER — Other Ambulatory Visit (HOSPITAL_COMMUNITY): Payer: Medicare Other

## 2016-07-26 DIAGNOSIS — I339 Acute and subacute endocarditis, unspecified: Secondary | ICD-10-CM

## 2016-07-26 DIAGNOSIS — I059 Rheumatic mitral valve disease, unspecified: Secondary | ICD-10-CM | POA: Diagnosis present

## 2016-07-26 DIAGNOSIS — I340 Nonrheumatic mitral (valve) insufficiency: Secondary | ICD-10-CM

## 2016-07-26 DIAGNOSIS — I2510 Atherosclerotic heart disease of native coronary artery without angina pectoris: Secondary | ICD-10-CM

## 2016-07-26 DIAGNOSIS — I480 Paroxysmal atrial fibrillation: Secondary | ICD-10-CM

## 2016-07-26 DIAGNOSIS — I058 Other rheumatic mitral valve diseases: Secondary | ICD-10-CM | POA: Diagnosis present

## 2016-07-26 HISTORY — PX: TEE WITHOUT CARDIOVERSION: SHX5443

## 2016-07-26 LAB — CBC
HCT: 31.1 % — ABNORMAL LOW (ref 39.0–52.0)
Hemoglobin: 9.8 g/dL — ABNORMAL LOW (ref 13.0–17.0)
MCH: 30 pg (ref 26.0–34.0)
MCHC: 31.5 g/dL (ref 30.0–36.0)
MCV: 95.1 fL (ref 78.0–100.0)
PLATELETS: 110 10*3/uL — AB (ref 150–400)
RBC: 3.27 MIL/uL — AB (ref 4.22–5.81)
RDW: 17.4 % — ABNORMAL HIGH (ref 11.5–15.5)
WBC: 18.6 10*3/uL — AB (ref 4.0–10.5)

## 2016-07-26 LAB — BASIC METABOLIC PANEL
Anion gap: 8 (ref 5–15)
BUN: 27 mg/dL — AB (ref 6–20)
CO2: 21 mmol/L — AB (ref 22–32)
Calcium: 8.2 mg/dL — ABNORMAL LOW (ref 8.9–10.3)
Chloride: 109 mmol/L (ref 101–111)
Creatinine, Ser: 1.17 mg/dL (ref 0.61–1.24)
GFR calc Af Amer: >60 mL/min (ref >60)
GLUCOSE: 118 mg/dL — AB (ref 65–99)
POTASSIUM: 3.8 mmol/L (ref 3.5–5.1)
SODIUM: 138 mmol/L (ref 135–145)

## 2016-07-26 SURGERY — ECHOCARDIOGRAM, TRANSESOPHAGEAL
Anesthesia: Moderate Sedation

## 2016-07-26 MED ORDER — MIDAZOLAM HCL 5 MG/ML IJ SOLN
INTRAMUSCULAR | Status: AC
  Filled 2016-07-26: qty 2

## 2016-07-26 MED ORDER — OXYCODONE-ACETAMINOPHEN 5-325 MG PO TABS
1.0000 | ORAL_TABLET | Freq: Three times a day (TID) | ORAL | Status: DC | PRN
  Administered 2016-07-26 – 2016-07-29 (×4): 1 via ORAL
  Filled 2016-07-26 (×4): qty 1

## 2016-07-26 MED ORDER — HYDROCODONE-ACETAMINOPHEN 5-325 MG PO TABS
1.0000 | ORAL_TABLET | Freq: Once | ORAL | Status: AC
  Administered 2016-07-26: 1 via ORAL
  Filled 2016-07-26: qty 1

## 2016-07-26 MED ORDER — FENTANYL CITRATE (PF) 100 MCG/2ML IJ SOLN
INTRAMUSCULAR | Status: DC | PRN
  Administered 2016-07-26: 25 ug via INTRAVENOUS

## 2016-07-26 MED ORDER — SODIUM CHLORIDE 0.9 % IV SOLN
INTRAVENOUS | Status: DC
  Administered 2016-07-27: 18:00:00 via INTRAVENOUS

## 2016-07-26 MED ORDER — ZOLPIDEM TARTRATE 5 MG PO TABS
10.0000 mg | ORAL_TABLET | Freq: Every evening | ORAL | Status: DC | PRN
Start: 2016-07-26 — End: 2016-07-29
  Administered 2016-07-26 – 2016-07-28 (×3): 10 mg via ORAL
  Filled 2016-07-26 (×3): qty 2

## 2016-07-26 MED ORDER — FENTANYL CITRATE (PF) 100 MCG/2ML IJ SOLN
INTRAMUSCULAR | Status: AC
  Filled 2016-07-26: qty 2

## 2016-07-26 MED ORDER — BUTAMBEN-TETRACAINE-BENZOCAINE 2-2-14 % EX AERO
INHALATION_SPRAY | CUTANEOUS | Status: DC | PRN
Start: 2016-07-26 — End: 2016-07-26
  Administered 2016-07-26: 2 via TOPICAL

## 2016-07-26 MED ORDER — MIDAZOLAM HCL 10 MG/2ML IJ SOLN
INTRAMUSCULAR | Status: DC | PRN
  Administered 2016-07-26: 2 mg via INTRAVENOUS

## 2016-07-26 MED ORDER — DEXTROSE 5 % IV SOLN
2.0000 g | INTRAVENOUS | Status: DC
  Administered 2016-07-26 – 2016-07-28 (×3): 2 g via INTRAVENOUS
  Filled 2016-07-26 (×4): qty 2

## 2016-07-26 MED ORDER — SODIUM CHLORIDE 0.9 % IV SOLN
INTRAVENOUS | Status: DC
  Administered 2016-07-26: 15:00:00 via INTRAVENOUS

## 2016-07-26 NOTE — Progress Notes (Addendum)
[facility] for Infectious Disease  Date of Admission:  07/24/2016     Active Problems:   CVA (cerebral vascular accident) ([place])   Endocarditis of mitral valve   History of pulmonary embolism   Paroxysmal atrial fibrillation (HCC)   Coronary artery disease involving native coronary artery   Lupus anticoagulant positive   Atrial flutter (HCC)   Current use of long term anticoagulation   Chronic diastolic CHF (congestive heart failure) ([place])   Essential hypertension   Cancer of base of tongue (HCC)   PMR (polymyalgia rheumatica) (HCC)   Benign essential HTN   Anemia of chronic disease   Thrombocytopenia (HCC)   Bradycardia   Syncope and collapse   Sepsis ([place])   .  stroke: mapping our early stages of recovery book   Does not apply Once  . aspirin  300 mg Rectal Daily   Or  . aspirin  325 mg Oral Daily  . mouth rinse  15 mL Mouth Rinse BID    SUBJECTIVE: He reports that he is feeling very weak. His wife noted some transient right facial droop and right hand weakness when he was readmitted last week but this resolved spontaneously. He is not having any more nausea, vomiting or diarrhea.  Review of Systems: Review of Systems  Constitutional: Positive for malaise/fatigue. Negative for chills, diaphoresis and fever.  Respiratory: Negative for cough, sputum production and shortness of breath.   Cardiovascular: Negative for chest pain.  Gastrointestinal: Negative for abdominal pain, diarrhea, nausea and vomiting.  Genitourinary: Negative for dysuria.  Musculoskeletal: Negative for back pain.  Skin: Negative for rash.  Neurological: Positive for weakness. Negative for sensory change, speech change, focal weakness and headaches.    Past Medical History:  Diagnosis Date  . Acute CHF (congestive heart failure) ([place]) 11/01/2015  . Acute on chronic diastolic heart failure ([place]) 09/27/2015  . Acute pulmonary embolus ([place]) 2010  . Acute respiratory failure with  hypoxia ([place]) 09/09/2015  . Arthritis   . Atrial fibrillation [facility])    s/p inpatient DCCV 09/11/2015  . Atrial flutter ([place])   . CHF (congestive heart failure) ([place])   . Coronary artery disease involving native coronary artery 05/15/2015   multivessel  . Dysrhythmia    A-fib  . Essential hypertension   . Fibromyalgia   . History of kidney stones   . Hypercholesterolemia   . Hypertension   . Lupus anticoagulant positive 07/29/2008  . Mitral regurgitation 10/30/2015  . PMR (polymyalgia rheumatica) (HCC)   . Pneumonia   . 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 S/P Maze operation for atrial fibrillation 05/29/2015   Complete bilateral atrial lesion set via median sternotomy using bipolar radiofrequency and cryothermy ablation with clipping of LA appendage  . 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 Smoking status: Never Smoker  . Smokeless tobacco: Never Used  . Alcohol use No    Family History  Problem Relation Age of Onset  . Cancer Mother   . Other Sister     chf  . Other Sister     Rosalee Kaufman  Allergies  Allergen Reactions  . Amitriptyline Shortness Of Breath  . Serotonin Reuptake Inhibitors ([place]sris) Nausea And Vomiting    OBJECTIVE: Vitals:   07/26/16 0900 07/26/16 1000 07/26/16 1100 07/26/16 1126  BP: 125/87 110/69 (!) 142/95   Pulse: 83 92 84   Resp: 15 (!) 32 17   Temp:    97.7 F (36.5 C)  TempSrc:    Oral  SpO2: 95% (!) 89% 100%   Weight:      Height:       Body mass index is 27.96 kg/m.  Physical Exam  Constitutional: He is oriented to person, place, and time.  He is pale and weak but resting quietly in bed. His wife is at  the bedside.  Cardiovascular: Normal rate and regular rhythm.   No murmur heard. Pulmonary/Chest: Breath sounds normal.  Abdominal: Soft. There is no tenderness.  Neurological: He is alert and oriented to person, place, and time.  Skin: No rash noted.  Psychiatric: Mood and affect normal.    Lab Results Lab Results  Component Value Date   WBC 18.6 (H) 07/26/2016   HGB 9.8 (L) 07/26/2016   HCT 31.1 (L) 07/26/2016   MCV 95.1 07/26/2016   PLT 110 (L) 07/26/2016    Lab Results  Component Value Date   CREATININE 1.51 (H) 07/25/2016   BUN 29 (H) 07/25/2016   NA 134 (L) 07/25/2016   K 4.0 07/25/2016   CL 105 07/25/2016   CO2 21 (L) 07/25/2016    Lab Results  Component Value Date   ALT 84 (H) 07/25/2016   AST 44 (H) 07/25/2016   ALKPHOS 52 07/25/2016   BILITOT 1.1 07/25/2016     Microbiology: Recent Results (from the past 240 hour(s))  Blood culture (routine x 2)     Status: None   Collection Time: 07/19/16 11:56 AM  Result Value Ref Range Status   Specimen Description BLOOD LEFT ANTECUBITAL  Final   Special Requests IN PEDIATRIC BOTTLE 3.5CC  Final   Culture   Final    NO GROWTH 5 DAYS Performed at [facility], [place] [address] [place], [place] [address]    Report Status 07/24/2016 FINAL  Final  Urine culture     Status: None   Collection Time: 07/19/16 11:56 AM  Result Value Ref Range Status   Specimen Description URINE, RANDOM  Final   Special Requests NONE  Final   Culture   Final    NO GROWTH Performed at [facility], [place] [address] [place], [place] [address]    Report Status 07/20/2016 FINAL  Final  Blood culture (routine x 2)     Status: None   Collection Time: 07/19/16 12:03 PM  Result Value Ref Range Status   Specimen Description BLOOD LEFT HAND  Final   Special Requests IN PEDIATRIC BOTTLE 3CC  Final   Culture   Final    NO GROWTH 5 DAYS Performed at [facility], [place] [address] [place], [place] [address]    Report Status  07/24/2016 FINAL  Final  C difficile quick scan w PCR reflex     Status: Abnormal   Collection Time: 07/21/16  6:46 AM  Result Value Ref Range Status   C Diff antigen POSITIVE (A) NEGATIVE Final   C Diff toxin NEGATIVE NEGATIVE Final   C Diff interpretation Results are indeterminate. See PCR results.  Final  Clostridium Difficile by PCR     Status: Abnormal   Collection  Time: 07/21/16  6:46 AM  Result Value Ref Range Status   Toxigenic C Difficile by pcr POSITIVE (A) NEGATIVE Final    Comment: Positive for toxigenic C. difficile with little to no toxin production. Only treat if clinical presentation suggests symptomatic illness. Performed at [facility], [place] [address], [place], [place] [address]   Blood Culture (routine x 2)     Status: None (Preliminary result)   Collection Time: 07/24/16  8:12 PM  Result Value Ref Range Status   Specimen Description BLOOD LEFT HAND  Final   Special Requests IN PEDIATRIC BOTTLE 1CC  Final   Culture NO GROWTH < 24 HOURS  Final   Report Status PENDING  Incomplete  Blood Culture (routine x 2)     Status: None (Preliminary result)   Collection Time: 07/24/16  8:22 PM  Result Value Ref Range Status   Specimen Description BLOOD RIGHT ANTECUBITAL  Final   Special Requests BOTTLES DRAWN AEROBIC ONLY 10CC  Final   Culture NO GROWTH < 24 HOURS  Final   Report Status PENDING  Incomplete  MRSA PCR Screening     Status: None   Collection Time: 07/25/16  3:54 AM  Result Value Ref Range Status   MRSA by PCR NEGATIVE NEGATIVE Final    Comment:        The GeneXpert MRSA Assay (FDA approved for NASAL specimens only), is one component of a comprehensive MRSA colonization surveillance program. It is not intended to diagnose MRSA infection nor to guide or monitor treatment for MRSA infections.      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 He is also  been treated for possible healthcare associated pneumonia. Brain MRI shows evidence of both acute and subacute strokes. I suspect that his subacute strokes or a result of septic emboli from his mitral valve vegetation but it is unclear what caused his more acute left vertebral artery stroke. It is possible that he could've embolized some persistent vegetation but this would not necessarily mean that his infection is still active. It is also possible that he has left atrial clot due to paroxysmal atrial fibrillation. For now I favor continued observation off of antibiotics pending repeat TEE this afternoon. Recent blood cultures are negative.  PLAN: 1. Continue observation off of antibiotics 2. Await results final blood cultures and TEE  Michel Bickers, MD [facility] for Infectious [place] [phone] pager   [phone] cell 07/26/2016, 11:52 AM   Addendum: I discussed the results of the TEE with Dr. Einar Gip and Dr. Leonie Man. Despite having recently completed 6 weeks of IV antibiotic therapy his mitral valve vegetation has enlarged making it the most probable source for his recent acute stroke. Although he is afebrile and recent blood cultures (obtained while on antibiotics) are negative I will restart IV antibiotic therapy again. I will also notify Dr. Ricard Dillon, his cardiac surgeon.  Michel Bickers, MD [facility] for [facility] [phone] pager   [phone] cell 07/26/2016, 5:39 PM

## 2016-07-26 NOTE — Progress Notes (Signed)
SLP Cancellation Note  Patient Details Name: Peter Bruce MRN: [id number] DOB: Jun 20, 1950   Cancelled treatment:       Reason Eval/Treat Not Completed: Patient at procedure or test/unavailable. Attempted x2. Will f/u tomorrow.    Dovie Kapusta, Katherene Ponto 07/26/2016, 2:30 PM

## 2016-07-26 NOTE — Progress Notes (Signed)
Subjective:  Wants to go home. Spoke to me for 15 minutes trying to convince me to let him go!! No other complaints.  Objective:  Vital Signs in the last 24 hours: Temp:  [98.4 F (36.9 C)-100 F (37.8 C)] 98.9 F (37.2 C) (03/20 1130) Pulse Rate:  [62-103] 101 (03/20 1300) Resp:  [15-27] 21 (03/20 1300) BP: (109-163)/(74-109) 121/98 (03/20 1300) SpO2:  [88 %-100 %] 100 % (03/20 1300)  Intake/Output from previous day: 03/19 0701 - 03/20 0700 In: 1797.5 [P.O.:120; I.V.:1627.5; IV Piggyback:50] Out: 910 [Urine:910]  Physical Exam: General appearance: alert, cooperative, appears stated age and no distress Lungs: faintscattered crackles diffuse, rightbase slightly worse. Chest wall: no tenderness Heart: S1, S2 is normal, 1/6 midsystolic murmur at the apex.  No gallop. Abdomen: soft, non-tender; bowel sounds normal; no masses,  no organomegaly Extremities: Full range of motion and is, 2+ leg edema right worse than the left. Skin: Skin color, texture, turgor normal. No rashes or lesions Neurologic: Grossly normal, flat affect.  Scrotum and penis: Marked scrotal edema present.  Lab Results: BMP  Recent Labs  07/25/16 0832 07/25/16 1400 07/26/16 1245  NA 137 134* 138  K 4.4 4.0 3.8  CL 106 105 109  CO2 20* 21* 21*  GLUCOSE 92 109* 118*  BUN 33* 29* 27*  CREATININE 1.38* 1.51* 1.17  CALCIUM 8.2* 8.2* 8.2*  GFRNONAA 51* 46* >60  GFRAA 60* 53* >60    CBC  Recent Labs Lab 07/25/16 1400 07/26/16 0345  WBC 21.1* 18.6*  RBC 3.33* 3.27*  HGB 10.1* 9.8*  HCT 31.7* 31.1*  PLT 95* 110*  MCV 95.2 95.1  MCH 30.3 30.0  MCHC 31.9 31.5  RDW 17.9* 17.4*  LYMPHSABS 1.3  --   MONOABS 1.7*  --   EOSABS 0.5  --   BASOSABS 0.1  --     HEMOGLOBIN A1C Lab Results  Component Value Date   HGBA1C 5.7 (H) 06/14/2016   MPG 117 06/14/2016    Cardiac Panel (last 3 results)  Recent Labs  07/25/16 0742 07/25/16 1400 07/25/16 1936  TROPONINI 0.11* 0.08* 0.07*    Recent  Labs  06/12/16 1118  07/24/16 2008 07/25/16 0832 07/25/16 1400  PROT 6.2*  6.2*  < > 5.2* 5.1* 5.2*  ALBUMIN 2.5*  2.5*  < > 2.4* 2.5* 2.5*  AST 39  38  < > 44* 48* 44*  ALT 23  23  < > 94* 84* 84*  ALKPHOS 47  48  < > 46 47 52  BILITOT 0.7  0.6  < > 1.0 0.9 1.1  BILIDIR 0.2  --   --   --   --   IBILI 0.5  --   --   --   --   < > = values in this interval not displayed.  Imaging: Imaging results have been reviewed  Cardiac Studies:  Telemetry: Reviewed up to today 07/27/16:  NSR, frequent PAC and occasional PVC. No A. Fibrillation.  EKG 07/25/2016: Sinus rhythm with first-degree AV block, low voltage complexes, PVC.  Frequent.  Ventricle couplets noted.  EKG 06/11/2016: Sinus tachycardia at the rate of 98 bpm, first-degree AV block, cannot exclude inferior infarct old.  Incomplete right branch block.  PVC.  EKG 06/11/2016: Normal sinus rhythm/sinus tachycardia at the rate of 100 bpm, normal axis. No evidence of ischemia, normal EKG. PVC.  TEE 03/19/018: ------------------------------------------------------------------- LV EF: 40% -   45%  ------------------------------------------------------------------- Indications:      CVA 436.  -------------------------------------------------------------------  History:   PMH:   Mitral valve disease.  ------------------------------------------------------------------- Study Conclusions  - Left ventricle: Systolic function was mildly to moderately   reduced. The estimated ejection fraction was in the range of 40%   to 45%. The intraventricular septum displays 8 shaped septum both   in systole and diastole and may suggest elevated right [id number].   Septal motion may also be consistent with post thoracotomy state.   Left ventricle is not well visualized due to reverberation   artifact. - Aortic valve: There was mild regurgitation. - Mitral valve: The mitral valve ring sutures are well preserved.   There is a very  large 3.7 x 1.5 cm vegetation noted on the   anterior mitral leaflet, encompassing the entire anterior mitral   leaflet. There is one small second-degree vegetation that is   highly mobile that is very tiny and small on the atrial side.   There is very mild posterior directed mitral regurgitation. There   is mild to moderate mitral stenosis, mean gradient 10 mmHg,   calculated aortic valve area by pressure halftime is 1.9 cm^2.   Valve area by pressure half-time: 1.91 cm^2. - Left atrium: The left atrial appendage is clipped. There is no   residual thrombus on the left atrial appendage. There is no smoke   in the left atrium. The atrium was dilated. - Right ventricle: The cavity size was moderately dilated. Systolic   function was moderately reduced. - Tricuspid valve: No evidence of vegetation. - Pulmonic valve: No evidence of vegetation. - Pulmonary arteries: PA peak pressure: 42 mm Hg (S). - Pericardium, extracardiac: There was a right pleural effusion.   Assessment/Plan:  1. Native valve endocarditis with recurrent stroke.  2. History of minimally invasive mitral valve repair on 11/21/2015 3. Atherosclerosis of native coronary artery of native heart with angina pectoris S/P CABG x 3 on 05/29/2015 withLIMA to LAD, SVG to OM2, SVG to RCA; and Maze procedure and clipping of left atrial appendage. 4. Paroxysmal atrial fibrillation and atrial flutter, S/P Maze procedure and left atrial appendage closure during CABG, presently on Xarelto. Continue the same. CHA2DS2-VASCScore: Risk Score 4.0 Yearly risk of stroke 4%  5. Hypercoagulable state, primary. On long-term anticoagulation with Coumadin. 6. Hypertension  Recommendation: Extremely difficult situation with regard to management. I have met with his wife yesterday and also over the telephone today and have had extensive discussions regarding prognosis being extremely guarded in view of inability to anticoagulate or proceed with mitral  valve replacement surgery.  I also had discussions with Dr. Darylene Price regarding the situation.  At this point wait for watching is indicated with continued antibiotic therapy.  Patient very disturbed about staying in the hospital and requests me to discharge him home.  He is obviouslythat he can be discharged.  However would like to have occupational therapy/physical therapy evaluate him.   Peter Bruce, M.D. 07/27/2016, 5:47 PM [facility] Pager: [phone] Office: [phone] If no answer: [phone]

## 2016-07-26 NOTE — Evaluation (Signed)
Physical Therapy Evaluation Patient Details Name: Peter Bruce MRN: [id number] DOB: Feb 20, 1951 Today's Date: 07/26/2016   History of Present Illness  [name] is a 66 y.o. 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 Recently discharged from hospital for Pna and confusion. 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 Pt needs skilled PT to maximize independence and safety to allow discharge to home with wife and possibly HHPT. Pt without apparent deficits from cerebellar CVA. Mobility deficits seem to primarily due to limited activity tolerance and swollen scrotum. Expect deconditioning due to multiple recent hospitalizations and medical problems. Expect pt will make steady progress toward regaining independence.     Follow Up Recommendations Supervision for mobility/OOB;Home health PT (possibly HHPT)    Equipment Recommendations  None recommended by PT    Recommendations for Other Services       Precautions / Restrictions Precautions Precautions: Fall (due to fatigue) Restrictions Weight Bearing Restrictions: No      Mobility  Bed Mobility Overal bed mobility: Needs Assistance Bed Mobility: Rolling;Sidelying to Sit Rolling: Min assist Sidelying to sit: Min assist       General bed mobility comments: Assist to elevate trunk into sitting.  Transfers Overall transfer level: Needs assistance Equipment used: Rolling walker (2 wheeled) Transfers: Sit to/from Stand Sit to Stand: Min assist         General transfer comment: Assist for stability. Pt  rises with wide base due to swollen scrotum  Ambulation/Gait Ambulation/Gait assistance: Min assist Ambulation Distance (Feet): 100 Feet Assistive device: Rolling walker (2 wheeled) Gait Pattern/deviations: Step-through pattern;Decreased step length - right;Decreased step length - left;Wide base of support;Trunk flexed Gait velocity: decr Gait velocity interpretation: Below normal speed for age/gender General Gait Details: Assist for balance. Verbal cues to stand more erect and look up. Pt with SpO2 >90% after amb on RA.  Stairs            Wheelchair Mobility    Modified Rankin (Stroke Patients Only) Modified Rankin (Stroke Patients Only) Pre-Morbid Rankin Score: Slight disability Modified Rankin: Moderately severe disability     Balance Overall balance assessment: Needs assistance Sitting-balance support: Feet supported;No upper extremity supported Sitting balance-Leahy Scale: Good     Standing balance support: Bilateral upper extremity supported Standing balance-Leahy Scale: Poor Standing balance comment: walker and supervision for static standing                             Pertinent Vitals/Pain Pain Assessment: Faces Faces Pain Scale: Hurts little more Pain Location: scrotum Pain Descriptors / Indicators: Grimacing;Tender Pain Intervention(s): Repositioned    Home Living Family/patient expects to be discharged to:: Private residence Living Arrangements: Spouse/significant other Available Help at Discharge: Family;Available 24 hours/day Type of Home: House Home Access: Stairs to enter Entrance Stairs-Rails: Psychiatric nurse of Steps: 3 Home Layout: One level Home Equipment: Walker - 2 wheels;Cane - single point;Shower seat - built in;Hand held shower head;Grab bars - tub/shower      Prior Function Level of Independence: Independent with assistive device(s)  Comments: Prior to 2 recent hospitalizations very active.   Drives. 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 [employer]   Dominant Hand: Right    Extremity/Trunk Assessment   Upper Extremity Assessment Upper Extremity Assessment: Defer to OT evaluation    Lower Extremity Assessment Lower Extremity Assessment: Generalized weakness       Communication   Communication: No difficulties  Cognition Arousal/Alertness: Awake/alert Behavior During Therapy: Flat affect Overall Cognitive Status: Within Functional Limits for tasks assessed                      General Comments      Exercises     Assessment/Plan    PT Assessment Patient needs continued PT services  PT Problem List Decreased strength;Decreased activity tolerance;Decreased balance;Decreased mobility;Decreased knowledge of use of DME       PT Treatment Interventions DME instruction;Gait training;Functional mobility training;Therapeutic activities;Therapeutic exercise;Balance training;Patient/family education    PT Goals (Current goals can be found in the Care Plan section)  Acute Rehab PT Goals Patient Stated Goal: return home PT Goal Formulation: With patient/family Time For Goal Achievement: 08/02/16 Potential to Achieve Goals: Good    Frequency Min 3X/week   Barriers to discharge        Co-evaluation   Reason for Co-Treatment: To address functional/ADL transfers;For patient/therapist safety   OT goals addressed during session: ADL's and self-care;Strengthening/ROM       End of Session Equipment Utilized During Treatment: Gait belt Activity Tolerance: Patient limited by fatigue Patient left: in chair;with call bell/phone within reach;with family/visitor present Nurse Communication: Mobility status PT Visit Diagnosis: Muscle weakness (generalized) (M62.81);Unsteadiness on feet (R26.81)         Time: 9977-4142 PT Time Calculation (min) (ACUTE ONLY): 29 min   Charges:   PT Evaluation $PT Eval Moderate  Complexity: 1 Procedure     PT G CodesShary Decamp [place] 2016/08/15, 1:42 PM [facility] PT [phone]

## 2016-07-26 NOTE — H&P (View-Only) (Signed)
CARDIOLOGY CONSULT NOTE  Patient ID: Peter Bruce MRN: [id number] DOB/AGE: 07-01-1950 66 y.o.  Admit date: 07/24/2016 Referring Physician Reyne Dumas,  MD Primary Physician:  Adrian Prows, MD Reason for Consultation  New stroke, endocarditis, PVC  HPI: Peter Bruce  is a 66 y.o. male  With known coronary artery disease and did undergone CABG 3 on 05/29/2015. 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  He was admitted on 06/11/2016 and discharged on 06/17/2016 with mitral valve and endocarditis, septic emboli to his brain, on IV antibiotic therapy.  He had streptococcal endocarditis. He was re-admitted with pneumonia on 07/19/16 and discharged 2 days ago. 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  Prior to presentation, he had missed 2 doses of Xarelto, one day before the admission and on the day of admission.  Events prior to the admission noted including bradycardia, altered mental status and hypotension and PVCs.  These symptoms have resolved.  This afternoon he and his wife at the bedside, he is alert and oriented 3 and I was able to obtain all the history.  He feels very tired and weak but otherwise states that he feels well enough to go home and wants to eat.  He has been made n.p.o. due to aspiration risk with new stroke.  No fever, no chills.  He complains of scrotal edema and leg edema that is getting slightly worse.  No fever, no chills.  No headache or visual disturbances.  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 He also has diarrhea, loss of appetite, not feeling well.his past medical history significant for Paroxysmal atrial fibrillation, history of DVT and PE and has been on long-term anticoagulation.  Past Medical History:  Diagnosis Date  . Acute CHF (congestive heart failure) ([place]) 11/01/2015  . Acute  on chronic diastolic heart failure ([place]) 09/27/2015  . Acute pulmonary embolus ([place]) 2010  . Acute respiratory failure with hypoxia ([place]) 09/09/2015  . Arthritis   . Atrial fibrillation [facility])    s/p inpatient DCCV 09/11/2015  . Atrial flutter ([place])   . CHF (congestive heart failure) ([place])   . Coronary artery disease involving native coronary artery 05/15/2015   multivessel  . Dysrhythmia    A-fib  . Essential hypertension   . Fibromyalgia   . History of kidney stones   . Hypercholesterolemia   . Hypertension   . Lupus anticoagulant positive 07/29/2008  . Mitral regurgitation 10/30/2015  . PMR (polymyalgia rheumatica) (HCC)   . Pneumonia   . 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 S/P Maze operation for atrial fibrillation 05/29/2015   Complete bilateral atrial lesion set via median sternotomy using bipolar radiofrequency and cryothermy ablation with clipping of LA appendage  . 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 APPENDECTOMY    . arm surgery  Right    wrist and elbow  . CARDIAC CATHETERIZATION N/A 05/15/2015   Procedure: Left Heart Cath and Coronary Angiography;  Surgeon: Peter M Martinique, MD;  Location: [place] CV LAB;  Service: Cardiovascular;  Laterality: N/A;  . CARDIAC CATHETERIZATION N/A 11/17/2015   Procedure: Right/Left Heart Cath and Coronary/Graft Angiography;  Surgeon: Adrian Prows, MD;  Location: [place] CV LAB;  Service: Cardiovascular;  Laterality: N/A;  . CARDIOVERSION N/A 09/11/2015   Procedure: CARDIOVERSION;  Surgeon: Lelon Perla, MD;  Location: [facility] ENDOSCOPY;  Service: Cardiovascular;  Laterality: N/A;  . CHEST TUBE  INSERTION  11/21/2015   Procedure: LEFT CHEST TUBE INSERTION;  Surgeon: Rexene Alberts, MD;  Location: [place];  Service: Open Heart Surgery;;  . CLIPPING OF ATRIAL APPENDAGE  05/29/2015   Procedure: CLIPPING OF LEFT ATRIAL APPENDAGE;  Surgeon: Rexene Alberts, MD;  Location: [place];  Service: Open Heart Surgery;;  45 AtriClip PRO 145  . CORONARY ARTERY BYPASS GRAFT N/A 05/29/2015   Procedure: CORONARY ARTERY BYPASS GRAFTING (CABG), ON PUMP, TIMES THREE, USING LEFT INTERNAL MAMMARY ARTERY, BILATERAL GREATER SAPHENOUS VEINS HARVESTED ENDOSCOPICALLY;  Surgeon: Rexene Alberts, MD;  Location: [place];  Service: Open Heart Surgery;  Laterality: N/A;  . FRACTURE SURGERY     right wrist  . HERNIA REPAIR    . MAZE N/A 05/29/2015   Procedure: MAZE;  Surgeon: Rexene Alberts, MD;  Location: [place];  Service: Open Heart Surgery;  Laterality: N/A;  Complete Bi-Atrial Lesion set with Ablation and Cryothermy  . MITRAL VALVE REPAIR Right 11/21/2015   Procedure: MINIMALLY INVASIVE REOPERATION FOR MITRAL VALVE REPAIR;  Surgeon: Rexene Alberts, MD;  Location: [place];  Service: Open Heart Surgery;  Laterality: Right;  . PERIPHERAL VASCULAR CATHETERIZATION Bilateral 11/17/2015   Procedure: Renal Angiography;  Surgeon: Adrian Prows, MD;  Location: [place] CV LAB;  Service: Cardiovascular;  Laterality: Bilateral;  . PERIPHERAL VASCULAR CATHETERIZATION N/A 11/17/2015   Procedure: Abdominal Aortogram;  Surgeon: Adrian Prows, MD;  Location: [place] CV LAB;  Service: Cardiovascular;  Laterality: N/A;  . SHOULDER SURGERY Left    clavicular fracture  . TEE WITHOUT CARDIOVERSION N/A 05/29/2015   Procedure: TRANSESOPHAGEAL ECHOCARDIOGRAM (TEE);  Surgeon: Rexene Alberts, MD;  Location: [place];  Service: Open Heart Surgery;  Laterality: N/A;  . TEE WITHOUT CARDIOVERSION N/A 10/30/2015   Procedure: TRANSESOPHAGEAL ECHOCARDIOGRAM (TEE);  Surgeon: Adrian Prows, MD;  Location: [place];  Service: Cardiovascular;  Laterality: N/A;  . TEE  WITHOUT CARDIOVERSION N/A 11/21/2015   Procedure: TRANSESOPHAGEAL ECHOCARDIOGRAM (TEE);  Surgeon: Rexene Alberts, MD;  Location: [place];  Service: Open Heart Surgery;  Laterality: N/A;     Family History  Problem Relation Age of Onset  . Cancer Mother   . Other Sister     chf  . Other Sister     Rosalee Kaufman     Social History: Social History   Social History  . Marital status: Married    Spouse name: N/A  . Number of children: N/A  . Years of education: N/A   Occupational History  . licensed Chief Financial Officer     self-employed   Social History Main Topics  . Smoking status: Never Smoker  . Smokeless tobacco: Never Used  . Alcohol use No  . Drug use: No  . Sexual activity: Not on file   Other Topics Concern  . Not on file   Social History Narrative   Lives with his wife (married 1990).  Former Health visitor. Body building/weight lifting, running for exercise.     Prescriptions Prior to Admission  Medication Sig Dispense Refill Last Dose  . acetaminophen (  TYLENOL) 325 MG tablet Take 2 tablets (650 mg total) by mouth every 6 (six) hours as needed for mild pain (or Fever >/= 101). 20 tablet 0   . amoxicillin-clavulanate (AUGMENTIN) 875-125 MG tablet Take 1 tablet by mouth 2 (two) times daily. 10 tablet 0 07/24/2016 at am  . diazepam (VALIUM) 10 MG tablet Take 10 mg by mouth at bedtime as needed for anxiety or sleep.    Past Week at Unknown time  . Multiple Vitamin (MULTIVITAMIN WITH MINERALS) TABS tablet Take 1 tablet by mouth daily.   Past Week at Unknown time  . ondansetron (ZOFRAN ODT) 8 MG disintegrating tablet Take 1 tablet (8 mg total) by mouth every 8 (eight) hours as needed for nausea or vomiting. 20 tablet 1 Past Month at Unknown time  . oxyCODONE-acetaminophen (PERCOCET) 7.5-325 MG tablet Take 1 tablet by mouth 3 (three) times daily as needed for severe pain.   Past Week at Unknown time  . rivaroxaban (XARELTO) 20 MG TABS tablet Take 1 tablet (20 mg  total) by mouth daily with supper. 30 tablet 2 07/16/2016 at 1800  . tiZANidine (ZANAFLEX) 4 MG tablet Take 2-4 mg by mouth at bedtime as needed for muscle spasms.   Past Week at Unknown time  . valsartan (DIOVAN) 160 MG tablet Take 1 tablet (160 mg total) by mouth daily.   Past Week at Unknown time  . zolpidem (AMBIEN) 10 MG tablet Take 10 mg by mouth at bedtime as needed for sleep.    Past Week at Unknown time     ROS: General: Marked fatigue Eyes: no blurry vision, diplopia, or amaurosis ENT: no sore throat or hearing loss Resp:Shortness of Breath CV: Leg edema   GI: no abdominal pain,has  Diarrhea, no blood. GU: Scrotal swelling Skin: no rash Neuro: no headache, numbness, tingling, or weakness of extremities Musculoskeletal: Severe chronic back pain Heme: no bleeding, DVT, or easy bruising Endo: no polydipsia or polyuria    Physical Exam: Blood pressure 130/84, pulse 83, temperature 99.1 F (37.3 C), temperature source Oral, resp. rate (!) 21, height 5\' 10"  (1.778 m), weight 194 lb 14.2 oz (88.4 kg), SpO2 97 %.   General appearance: alert, cooperative, appears stated age and no distress Lungs: faintscattered crackles diffuse, rightbase slightly worse. Chest wall: no tenderness Heart: S1 is be able, S2 is normal, 1/6 midsystolic murmur at the apex.  No gallop. Abdomen: soft, non-tender; bowel sounds normal; no masses,  no organomegaly Extremities: Full range of motion and is, 2+ leg edema right worse than the left. Skin: Skin color, texture, turgor normal. No rashes or lesions Neurologic: Grossly normal  Scrotum and penis: Marked scrotal edema present.  Labs:   Lab Results  Component Value Date   WBC 21.1 (H) 07/25/2016   HGB 10.1 (L) 07/25/2016   HCT 31.7 (L) 07/25/2016   MCV 95.2 07/25/2016   PLT 95 (L) 07/25/2016     Recent Labs Lab 07/25/16 1400  NA 134*  K 4.0  CL 105  CO2 21*  BUN 29*  CREATININE 1.51*  CALCIUM 8.2*  PROT 5.2*  BILITOT 1.1  ALKPHOS 52   ALT 84*  AST 44*  GLUCOSE 109*    Lipid Panel     Component Value Date/Time   CHOL 170 06/14/2016 0235   TRIG 133 06/14/2016 0235   HDL 24 (L) 06/14/2016 0235   CHOLHDL 7.1 06/14/2016 0235   VLDL 27 06/14/2016 0235   LDLCALC 119 (H) 06/14/2016 0235  BNP (last 3 results)  Recent Labs  11/06/15 0334 06/11/16 1035 07/24/16 2008  BNP 613.6* 259.0* 1,931.1*    HEMOGLOBIN A1C Lab Results  Component Value Date   HGBA1C 5.7 (H) 06/14/2016   MPG 117 06/14/2016   TSH  Recent Labs  09/19/15 1139 06/12/16 1118 07/24/16 2249  TSH 1.98 1.071 3.397    Radiology: Ct Head Wo Contrast  Result Date: 07/25/2016 CLINICAL DATA:  Followup stroke. Acute left PICA distribution. Other older embolic infarctions. EXAM: CT HEAD WITHOUT CONTRAST TECHNIQUE: Contiguous axial images were obtained from the base of the skull through the vertex without intravenous contrast. COMPARISON:  MRI same day.  CT yesterday. FINDINGS: Brain: Generalized atrophy again demonstrated. Low-density within the inferior cerebellum on the left appears the same, consistent with acute left PICA infarction. No evidence of hemorrhage. No increased swelling. No fourth ventricular compromise. Chronic small-vessel ischemic changes of the white matter remain evident. No hydrocephalus. No extra-axial collection. Vascular: There is atherosclerotic calcification of the major vessels at the base of the brain. Skull: Negative Sinuses/Orbits: Clear/normal Other: None IMPRESSION: No change since yesterday. Acute left PICA infarction with low-density in the inferior cerebellum on the left. No hemorrhage. No increased swelling. No fourth ventricular compromise. Electronically Signed   By: Nelson Chimes M.D.   On: 07/25/2016 11:55   Ct Head Wo Contrast  Result Date: 07/24/2016 CLINICAL DATA:  Altered mental status. Patient woke up screening in then was unresponsive. Left the hospital yesterday with diagnosis of Clostridium difficile.  EXAM: CT HEAD WITHOUT CONTRAST TECHNIQUE: Contiguous axial images were obtained from the base of the skull through the vertex without intravenous contrast. COMPARISON:  MRI brain 06/12/2016.  CT head 06/11/2016. FINDINGS: Brain: Focal low-attenuation areas demonstrated in the left anterior temporal lobe and in the left inferior cerebellar hemisphere, progressing since previous study, suggesting acute ischemic process. The left temporal changes were demonstrated on the previous MRI but the cerebellar changes appear to be new since that time. There is mild mass effect in the left posterior fossa with mild displacement and effacement of the fourth ventricle. 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 Diffuse cerebral atrophy. Ventricular dilatation consistent with central atrophy. Low-attenuation in the deep white matter consistent with small vessel ischemia. No midline shift. No abnormal extra-axial fluid collections. Gray-white matter junctions are distinct. Basal cisterns are not effaced. No acute intracranial hemorrhage. Vascular: Vascular calcifications are present in the carotid siphons and vertebrobasilar arteries peer Skull: Calvarium appears intact. Sinuses/Orbits: Paranasal sinuses and mastoid air cells are clear. Other: None. IMPRESSION: 1. Focal low-attenuation in the left anterior temporal lobe consistent with progressing infarct in the distribution of the left middle cerebral artery and as previously seen on MRI. 2. New area of low-attenuation in the left inferior cerebellar hemisphere with associated mass effect is consistent with acute infarct in the distribution of the left posterior inferior cerebral artery. This was not present on prior MRI, suggesting a new interval process. 3. Chronic atrophy and small vessel ischemic changes. No acute intracranial hemorrhage. Electronically Signed   By: Lucienne Capers M.D.   On: 07/24/2016 23:51   Mr Jodene Nam Head Wo Contrast  Result Date: 07/25/2016 CLINICAL DATA:  History  of intracranial septic emboli. Altered mental status with speech disturbance. EXAM: MRI HEAD WITHOUT CONTRAST MRA HEAD WITHOUT CONTRAST TECHNIQUE: Multiplanar, multiecho pulse sequences of the brain and surrounding structures were obtained without intravenous contrast. Angiographic images of the head were obtained using MRA technique without contrast. COMPARISON:  Brain MRI 12/30/2016 FINDINGS: MRI  HEAD FINDINGS Brain: There is diffusion restriction throughout the lower left cerebellum, in the distribution of the left posterior inferior cerebellar artery. There are multiple other areas of high signal on diffusion-weighted imaging in the posterior right temporal lobe and lateral left temporal lobe, as well as in the periatrial white matter of the right parietal lobe. ADC values at these locations are relatively normal, likely indicating that these are areas of subacute ischemia. There is hyperintense T2 weighted signal within the left cerebellum, left occipital lobe, left temporal lobe and within the periventricular white matter. A No mass lesion or midline shift. No hydrocephalus or extra-axial fluid collection. The midline structures are normal. No age advanced or lobar predominant atrophy. Vascular: Major intracranial arterial and venous sinus flow voids are preserved. Two foci of punctate chronic microhemorrhage of the left basal ganglia. Skull and upper cervical spine: The visualized skull base, calvarium, upper cervical spine and extracranial soft tissues are normal. Sinuses/Orbits: No fluid levels or advanced mucosal thickening. No mastoid effusion. Normal orbits. MRA HEAD FINDINGS Intracranial internal carotid arteries: Normal. Anterior cerebral arteries: Congenitally absent left A1 segment. Otherwise normal. Middle cerebral arteries: Normal. Posterior communicating arteries: Absent bilaterally. Posterior cerebral arteries: Normal. Basilar artery: Normal. Vertebral arteries: Right-dominant. There is minimal  flow related enhancement within the left vertebral artery. Superior cerebellar arteries: Normal. Anterior inferior cerebellar arteries: Normal. Posterior inferior cerebellar arteries: Normal on the right. Left PICA is not visualized and likely occluded. IMPRESSION: 1. 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 No associated acute hemorrhage or mass effect. 2. Scattered areas of DWI abnormality in the left temporal lobe, right temporal lobe and right parietal lobe are favored to be areas of subacute ischemia. The distribution is again suggestive of a central embolic process, though likely occurring at a different time from the current acute left PICA infarct. 3. 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 Electronically Signed   By: Ulyses Jarred M.D.   On: 07/25/2016 03:15   Mr Brain Wo Contrast  Result Date: 07/25/2016 CLINICAL DATA:  History of intracranial septic emboli. Altered mental status with speech disturbance. EXAM: MRI HEAD WITHOUT CONTRAST MRA HEAD WITHOUT CONTRAST TECHNIQUE: Multiplanar, multiecho pulse sequences of the brain and surrounding structures were obtained without intravenous contrast. Angiographic images of the head were obtained using MRA technique without contrast. COMPARISON:  Brain MRI 12/30/2016 FINDINGS: MRI HEAD FINDINGS Brain: There is diffusion restriction throughout the lower left cerebellum, in the distribution of the left posterior inferior cerebellar artery. There are multiple other areas of high signal on diffusion-weighted imaging in the posterior right temporal lobe and lateral left temporal lobe, as well as in the periatrial white matter of the right parietal lobe. ADC values at these locations are relatively normal, likely indicating that these are areas of subacute ischemia. There is hyperintense T2 weighted signal within the left cerebellum, left occipital lobe, left  temporal lobe and within the periventricular white matter. A No mass lesion or midline shift. No hydrocephalus or extra-axial fluid collection. The midline structures are normal. No age advanced or lobar predominant atrophy. Vascular: Major intracranial arterial and venous sinus flow voids are preserved. Two foci of punctate chronic microhemorrhage of the left basal ganglia. Skull and upper cervical spine: The visualized skull base, calvarium, upper cervical spine and extracranial soft tissues are normal. Sinuses/Orbits: No fluid levels or advanced mucosal thickening. No mastoid effusion. Normal orbits. MRA HEAD FINDINGS Intracranial internal carotid arteries:  Normal. Anterior cerebral arteries: Congenitally absent left A1 segment. Otherwise normal. Middle cerebral arteries: Normal. Posterior communicating arteries: Absent bilaterally. Posterior cerebral arteries: Normal. Basilar artery: Normal. Vertebral arteries: Right-dominant. There is minimal flow related enhancement within the left vertebral artery. Superior cerebellar arteries: Normal. Anterior inferior cerebellar arteries: Normal. Posterior inferior cerebellar arteries: Normal on the right. Left PICA is not visualized and likely occluded. IMPRESSION: 1. 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 No associated acute hemorrhage or mass effect. 2. Scattered areas of DWI abnormality in the left temporal lobe, right temporal lobe and right parietal lobe are favored to be areas of subacute ischemia. The distribution is again suggestive of a central embolic process, though likely occurring at a different time from the current acute left PICA infarct. 3. 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 Electronically Signed   By: Ulyses Jarred M.D.   On: 07/25/2016 03:15   Dg Chest Portable 1 View  Result Date: 07/24/2016 CLINICAL DATA:  66 year old male with altered  mental status EXAM: PORTABLE CHEST 1 VIEW COMPARISON:  Chest radiograph dated 07/19/2016 FINDINGS: There has been interval removal of the right-sided PICC. There is stable cardiomegaly. Mechanical cardiac valve and left atrial appendage occlusion device noted. There is mild vascular congestion. There has been significant interval improvement of the previously seen right pleural effusion and right lung base airspace densities. Small amount of pleural effusion and associated atelectasis versus infiltrate remains. The left lung is clear. There is no pneumothorax. Median sternotomy wires noted. No acute osseous pathology. IMPRESSION: 1. Interval improvement of the previously seen right-sided pleural effusion and right lung base airspace densities with minimal amount of residual pleural effusion and associated right lung base atelectasis/infiltrate. 2. Stable cardiomegaly with probable mild vascular congestion. No pulmonary edema. Electronically Signed   By: Anner Crete M.D.   On: 07/24/2016 20:23   Dg Swallowing Func-speech Pathology  Result Date: 07/25/2016 Objective Swallowing Evaluation: Type of Study: MBS-Modified Barium Swallow Study Patient Details Name: Peter Bruce MRN: [id number] Date of Birth: November 12, 1950 Today's Date: 07/25/2016 Time: SLP Start Time (ACUTE ONLY): 1530-SLP Stop Time (ACUTE ONLY): 1550 SLP Time Calculation (min) (ACUTE ONLY): 20 min Past Medical History: Past Medical History: Diagnosis Date . Acute CHF (congestive heart failure) ([place]) 11/01/2015 . Acute on chronic diastolic heart failure ([place]) 09/27/2015 . Acute pulmonary embolus ([place]) 2010 . Acute respiratory failure with hypoxia ([place]) 09/09/2015 . Arthritis  . Atrial fibrillation [facility])   s/p inpatient DCCV 09/11/2015 . Atrial flutter ([place])  . CHF (congestive heart failure) ([place])  . Coronary artery disease involving native coronary artery 05/15/2015  multivessel . Dysrhythmia   A-fib . Essential hypertension  . Fibromyalgia  . History of kidney  stones  . Hypercholesterolemia  . Hypertension  . Lupus anticoagulant positive 07/29/2008 . Mitral regurgitation 10/30/2015 . PMR (polymyalgia rheumatica) (H[facility])  . Pneumonia  . 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 S/P Maze operation for atrial fibrillation 05/29/2015  Complete bilateral atrial lesion set via median sternotomy using bipolar radiofrequency and cryothermy ablation with clipping of LA appendage . S/P minimally invasive mitral valve repair 11/21/2015  Complex valvuloplasty including artificial Gore-tex neochord placement x12 with 32 mm Sorin Memo 3D ring annuloplasty via right mini thoracotomy approach . 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 APPENDECTOMY   . arm surgery  Right   wrist and elbow . CARDIAC CATHETERIZATION N/A 05/15/2015  Procedure: Left Heart Cath and Coronary Angiography;  Surgeon: Peter M Martinique, MD;  Location: [place] CV LAB;  Service: Cardiovascular;  Laterality: N/A; . CARDIAC CATHETERIZATION N/A 11/17/2015  Procedure: Right/Left Heart Cath and Coronary/Graft Angiography;  Surgeon: Adrian Prows, MD;  Location: [place] CV LAB;  Service: Cardiovascular;  Laterality: N/A; . CARDIOVERSION N/A 09/11/2015  Procedure: CARDIOVERSION;  Surgeon: Lelon Perla, MD;  Location: [facility] ENDOSCOPY;  Service: Cardiovascular;  Laterality: N/A; . CHEST TUBE INSERTION  11/21/2015  Procedure: LEFT CHEST TUBE INSERTION;  Surgeon: Rexene Alberts, MD;  Location: [place];  Service: Open Heart Surgery;; . CLIPPING OF ATRIAL APPENDAGE  05/29/2015  Procedure: CLIPPING OF LEFT ATRIAL APPENDAGE;  Surgeon: Rexene Alberts, MD;  Location: [place];  Service: Open Heart Surgery;;  45 AtriClip PRO 145 . CORONARY ARTERY BYPASS GRAFT N/A 05/29/2015  Procedure: CORONARY ARTERY BYPASS GRAFTING (CABG), ON PUMP, TIMES THREE, USING LEFT INTERNAL MAMMARY ARTERY, BILATERAL GREATER  SAPHENOUS VEINS HARVESTED ENDOSCOPICALLY;  Surgeon: Rexene Alberts, MD;  Location: [place];  Service: Open Heart Surgery;  Laterality: N/A; . FRACTURE SURGERY    right wrist . HERNIA REPAIR   . MAZE N/A 05/29/2015  Procedure: MAZE;  Surgeon: Rexene Alberts, MD;  Location: [place];  Service: Open Heart Surgery;  Laterality: N/A;  Complete Bi-Atrial Lesion set with Ablation and Cryothermy . MITRAL VALVE REPAIR Right 11/21/2015  Procedure: MINIMALLY INVASIVE REOPERATION FOR MITRAL VALVE REPAIR;  Surgeon: Rexene Alberts, MD;  Location: [facility];  Service: Open Heart Surgery;  Laterality: Right; . PERIPHERAL VASCULAR CATHETERIZATION Bilateral 11/17/2015  Procedure: Renal Angiography;  Surgeon: Adrian Prows, MD;  Location: [facility];  Service: Cardiovascular;  Laterality: Bilateral; . PERIPHERAL VASCULAR CATHETERIZATION N/A 11/17/2015  Procedure: Abdominal Aortogram;  Surgeon: Adrian Prows, MD;  Location: [facility];  Service: Cardiovascular;  Laterality: N/A; . SHOULDER SURGERY Left   clavicular fracture . TEE WITHOUT CARDIOVERSION N/A 05/29/2015  Procedure: TRANSESOPHAGEAL ECHOCARDIOGRAM (TEE);  Surgeon: Rexene Alberts, MD;  Location: [facility];  Service: Open Heart Surgery;  Laterality: N/A; . TEE WITHOUT CARDIOVERSION N/A 10/30/2015  Procedure: TRANSESOPHAGEAL ECHOCARDIOGRAM (TEE);  Surgeon: Adrian Prows, MD;  Location: [place];  Service: Cardiovascular;  Laterality: N/A; . 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 Presented  with episode of unresponsiveness. HEad CT shows new CVA (Occlusion of the left posterior inferior cerebellar artery with associated acute infarct of the inferior left cerebellar hemisphere, Scattered areas of DWI abnormality in the left temporal lobe, right temporal lobe and right parietal lobe are favored to be areas of subacute ischemia). Recently discharged from [facility] where he was admitted with generalized weakness and found to have NCAP with acute hypoxia nad respiratory failure. Admission CXR: Interval improvement of the previously seen right-sided pleural effusion and right lung base airspace densities with minimal amount of residual pleural effusion and associated right lung base atelectasis/infiltrate. Note that bedside swallow evaluation complete during 06/2016 admission  with noted s/s of aspiration, recommendations for dysphagia 3, thin liquids and MBS however at the time patient declined MBS.  Subjective: "i can chew anything." Assessment / Plan / Recommendation CHL IP CLINICAL IMPRESSIONS 07/25/2016 Clinical Impression Patient presents with moderate, multifactorial pharyngeal dysphagia in the setting of acute CVA and recently diagnosed base of tongue cancer. Patient with anatomical changes (suspect 2/2 BOT cancer) and reduced hyolarygneal excursion which impact airway protection and bolus clearance through the UES. This frequently resulted in shallow penetration during the swallow with all liquid consistencies. Patient also has sensory deficits leading to premature spillage with thin and nectar thick liquids when taken in consecutive sips with both sensed and silent aspiration during the swallow. Mild-moderate residue remained in the pyriform sinuses and cp segment after the swallow with all consistencies (thin, nectar and honey-thick liquids) and solids. This residue occasionally spilled into the larygneal vestibule after the swallow. Pt with difficulty following commands for swallow  maneuvers or even to take single sips of liquid. Responded well to very basic commands "Sip, swallow, cough, swallow," but suspect he will have difficulty with carryover of this strategy without supervision. Patient was consistently able to clear penetrate with the above cues, and subsequent swallow is noted to reduce residue. Spoke with patient regarding risks of aspiration, which is minimized with use of strategies. He expresses a desire to continue eating and drinking. Recommend dys 3 diet with thin liquid via single cup sip only, with use of hard cough, reswallow after each sip, bite. Will follow up with patient and his wife for education and training in compensatory strategies, diet tolerance. SLP Visit Diagnosis Dysphagia, pharyngeal phase (R13.13) Attention and concentration deficit following -- Frontal lobe and executive function deficit following -- Impact on safety and function Severe aspiration risk;Risk for inadequate nutrition/hydration;Moderate aspiration risk   CHL IP TREATMENT RECOMMENDATION 07/25/2016 Treatment Recommendations Therapy as outlined in treatment plan below   Prognosis 07/25/2016 Prognosis for Safe Diet Advancement Fair Barriers to Reach Goals Other (Comment) Barriers/Prognosis Comment (No Data) CHL IP DIET RECOMMENDATION 07/25/2016 SLP Diet Recommendations Dysphagia 3 (Mech soft) solids;Thin liquid Liquid Administration via Cup Medication Administration Whole meds with puree Compensations Minimize environmental distractions;Slow rate;Small sips/bites;Hard cough after swallow;Multiple dry swallows after each bite/sip Postural Changes Seated upright at 90 degrees   CHL IP OTHER RECOMMENDATIONS 07/25/2016 Recommended Consults -- Oral Care Recommendations Oral care before and after PO Other Recommendations --   CHL IP FOLLOW UP RECOMMENDATIONS 07/25/2016 Follow up Recommendations Outpatient SLP   CHL IP FREQUENCY AND DURATION 07/25/2016 Speech Therapy Frequency (ACUTE ONLY) min 2x/week Treatment  Duration 2 weeks      CHL IP ORAL PHASE 07/25/2016 Oral Phase WFL Oral - Pudding Teaspoon -- Oral - Pudding Cup -- Oral - Honey Teaspoon -- Oral - Honey Cup -- Oral - Nectar Teaspoon -- Oral - Nectar Cup -- Oral - Nectar Straw -- Oral - Thin Teaspoon -- Oral - Thin Cup -- Oral - Thin Straw -- Oral - Puree -- Oral - Mech Soft -- Oral - Regular -- Oral - Multi-Consistency -- Oral - Pill -- Oral Phase - Comment --  CHL IP PHARYNGEAL PHASE 07/25/2016 Pharyngeal Phase Impaired Pharyngeal- Pudding Teaspoon -- Pharyngeal -- Pharyngeal- Pudding Cup -- Pharyngeal -- Pharyngeal- Honey Teaspoon -- Pharyngeal -- Pharyngeal- Honey Cup Reduced epiglottic inversion;Reduced airway/laryngeal closure;Penetration/Aspiration during swallow;Penetration/Apiration after swallow;Pharyngeal residue - pyriform;Pharyngeal residue - cp segment;Reduced laryngeal elevation;Reduced anterior laryngeal mobility Pharyngeal Material enters airway, remains ABOVE vocal cords and not ejected out Pharyngeal-  Nectar Teaspoon -- Pharyngeal -- Pharyngeal- Nectar Cup Reduced epiglottic inversion;Reduced airway/laryngeal closure;Reduced laryngeal elevation;Reduced anterior laryngeal mobility;Penetration/Aspiration during swallow;Penetration/Apiration after swallow;Trace aspiration;Pharyngeal residue - cp segment;Pharyngeal residue - pyriform Pharyngeal Material enters airway, passes BELOW cords without attempt by patient to eject out (silent aspiration) Pharyngeal- Nectar Straw -- Pharyngeal -- Pharyngeal- Thin Teaspoon Reduced anterior laryngeal mobility;Reduced epiglottic inversion;Reduced laryngeal elevation;Reduced airway/laryngeal closure;Penetration/Aspiration during swallow;Penetration/Apiration after swallow;Moderate aspiration;Pharyngeal residue - pyriform;Pharyngeal residue - cp segment Pharyngeal Material enters airway, remains ABOVE vocal cords and not ejected out Pharyngeal- Thin Cup Reduced epiglottic inversion;Reduced anterior laryngeal  mobility;Reduced laryngeal elevation;Reduced airway/laryngeal closure;Moderate aspiration;Penetration/Aspiration during swallow;Penetration/Apiration after swallow;Pharyngeal residue - pyriform;Pharyngeal residue - cp segment Pharyngeal Material enters airway, passes BELOW cords and not ejected out despite cough attempt by patient Pharyngeal- Thin Straw -- Pharyngeal -- Pharyngeal- Puree Reduced epiglottic inversion;Reduced anterior laryngeal mobility;Reduced laryngeal elevation;Reduced airway/laryngeal closure;Penetration/Apiration after swallow;Pharyngeal residue - pyriform;Pharyngeal residue - cp segment Pharyngeal Material enters airway, remains ABOVE vocal cords and not ejected out Pharyngeal- Mechanical Soft Reduced epiglottic inversion;Reduced anterior laryngeal mobility;Reduced laryngeal elevation;Reduced airway/laryngeal closure;Penetration/Apiration after swallow;Pharyngeal residue - pyriform;Pharyngeal residue - cp segment Pharyngeal Material enters airway, remains ABOVE vocal cords and not ejected out Pharyngeal- Regular Reduced epiglottic inversion;Reduced anterior laryngeal mobility;Reduced laryngeal elevation;Reduced airway/laryngeal closure;Penetration/Apiration after swallow;Pharyngeal residue - pyriform;Pharyngeal residue - cp segment Pharyngeal Material enters airway, remains ABOVE vocal cords and not ejected out Pharyngeal- Multi-consistency -- Pharyngeal -- Pharyngeal- Pill WFL Pharyngeal -- Pharyngeal Comment --  CHL IP CERVICAL ESOPHAGEAL PHASE 07/25/2016 Cervical Esophageal Phase Impaired Pudding Teaspoon -- Pudding Cup -- Honey Teaspoon -- Honey Cup -- Nectar Teaspoon -- Nectar Cup -- Nectar Straw -- Thin Teaspoon -- Thin Cup -- Thin Straw -- Puree -- Mechanical Soft -- Regular -- Multi-consistency -- Pill -- Cervical Esophageal Comment reduced duration and amplitude of opening 2/2 impaired hyolaryngeal excursion Deneise Lever, MS CF-SLP Speech-Language Pathologist [phone] No flowsheet  data found. Aliene Altes 07/25/2016, 6:36 PM               Scheduled Meds: .  stroke: mapping our early stages of recovery book   Does not apply Once  . aspirin  300 mg Rectal Daily   Or  . aspirin  325 mg Oral Daily  . mouth rinse  15 mL Mouth Rinse BID   Continuous Infusions: . sodium chloride 75 mL/hr at 07/25/16 1920   PRN Meds:.acetaminophen **OR** acetaminophen (TYLENOL) oral liquid 160 mg/5 mL **OR** acetaminophen   Office Echocardiogram 07/07/2016: Left ventricle cavity is normal in size. Mild asymmetric hypertrophy of the left ventricle. Normal global wall motion. Visual EF is 50-55%. Left atrial cavity is severely dilated at 5.7 cm. Right atrial cavity is severely dilated. Mild (Grade I) aortic regurgitation. MV ring noted. A large sized non-mobile vegetation is present on the anterior mitral valve leaflet measuring 1.5x1.3 cm. Appearence of chronic vegetation with echo bright appearence.  Severe mitral valve stenosis by gradients, but visually appears to be mild. Mitral valve peak pressure gradient of 30.5 and mean gradient of 13.5 mmHg, calculated mitral valve area  2.0  cm. Trace mitral regurgitation. Mild tricuspid regurgitation. Moderate pulmonary hypertension. Pulmonary artery systolic pressure is estimated at 42 mm Hg. IVC is dilated with poor inspiration collapse consistent with elevated right atrial pressure.  EKG 07/25/2016: Sinus rhythm with first-degree AV block, low voltage complexes, PVC.  Frequent.  Ventricle couplets noted.  EKG 06/11/2016: Sinus tachycardia at the rate of 98 bpm, first-degree AV block, cannot exclude inferior infarct old.  Incomplete right branch block.  PVC.  EKG 06/11/2016: Normal sinus rhythm/sinus tachycardia at the rate of 100 bpm, normal axis. No evidence of ischemia, normal EKG. PVC.  Echo: 06/13/2016: Normal LV systolic function. Mitral valve, especially the anterior mitral leaflet is severely thickened and appears myxomatous with  suspicion for vegetation, Measures approximately 1.5-2 cm x 1 cm. The vegetation is fixed. Posterior mitral leaflet is restricted in movement. There is moderate to severe mitral stenosis with a mean gradient of 22 mmHg, mitral valve area of 1.15 cm. Left atrium was moderate to severely dilated, moderate pulmonary hypertension, PA pressure 45 mmHg.  ASSESSMENT AND PLAN:  1. Native valve endocarditis with recurrent stroke.  2. History of minimally invasive mitral valve repair on 11/21/2015 3. Atherosclerosis of native coronary artery of native heart with angina pectoris S/P CABG x 3 on 05/29/2015 withLIMA to LAD, SVG to OM2, SVG to RCA; and Maze procedure and clipping of left atrial appendage. 4. Paroxysmal atrial fibrillation and atrial flutter, S/P Maze procedure and left atrial appendage closure during CABG, presently on Xarelto. Continue the same. CHA2DS2-VASCScore: Risk Score 4.0 Yearly risk of stroke 4%  5. Hypercoagulable state, primary. On long-term anticoagulation with Coumadin. 6. Hypertension 7. Hyperlipidemia  Rec: I am concerned that the stroke is from MV vegetation induced septic emboli and not cardioembolic phenomena from atrial fibrillation.  Over the past few weeks he has maintained sinus rhythm, however atrial fibrillation being paroxysmal could certainly increase the risk of stroke however he has had left atrial appendage clipping during CABG that reduces the risk of thromboembolic complication.  Hence evaluation for endocarditis with the TEE would be more appropriate at this point.  I had a very long discussion with the patient's wife that is extremely difficult situation whether it is thrombotic or septic emboli as a sequelae, both place him at extreme high risk of very poor prognosis.  If it is thrombotic, anticoagulation is an issue due to large cerebellar infarct.  If it is septic emboli, he is definitely not a candidate for redo mitral valve surgery at this point.  I will try  to set up a TEE in the morning if possible and make further recommendation.  With regard to anasarca, this is systemic inflammatory response syndrome, and I do not think he is in florid heart failure.  I have not made any changes to his medications.  Is presently on aspirin for stroke.  Adrian Prows, MD 07/25/2016, 8:54 PM [facility]. [place] Pager: [phone] Office: [phone] If no answer Cell [phone]

## 2016-07-26 NOTE — Progress Notes (Addendum)
[address]       [place] [address]             [phone]          CARDIOTHORACIC SURGERY CONSULTATION REPORT  PCP is Adrian Prows, MD Referring Provider is Adrian Prows, MD  Reason for consultation:  Prosthetic valve endocarditis  HPI:  Patient is a 66 year old male with history of coronary artery disease, hypertension, lupus anticoagulant positive with previous pulmonary embolus in the remote past, polymyalgia rheumatica on chronic prednisone, and persistent atrial fibrillation and atrial flutter who underwent coronary artery bypass grafting and Maze procedure on 05/29/2015.  Several months later he presented with relatively acute onset congestive heart failure due to severe symptomatic mitral regurgitation secondary to infarction of the posterior papillary muscle, for which he underwent minimally invasive mitral valve repair on 11/21/2015. His postoperative recovery following mitral valve repair was uncomplicated but notable for the development of atrial flutter. He was ultimately discharged home in stable rate controlled atrial flutter on warfarin anticoagulation.  He was last seen in our office on 02/16/2016 at which time he was doing well and maintaining sinus rhythm.    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  Several days later he became acutely ill, and he was subsequently hospitalized from 06/11/2016 through 06/17/2016 with Strep viridans sepsis with bacteremia, meningitis, and multiple septic emboli to the brain.  Transthoracic echocardiogram revealed a vegetation on the mitral valve without significant mitral regurgitation.  Transesophageal echocardiogram was not performed.  The patient was discharged home on intravenous Rocephin.  Anticoagulation was switched to Xarelto.  He was readmitted from  07/19/2016 with nausea, vomiting, diarrhea and generailized weakness.  He was treated for hypoxic respiratory failure attributed to pneumonia.  C difficile toxin assay was negative, although he was positive for the antigen.  He completed his previously prescribed course of Rocephin while he was in the hospital.   Rocephin was stopped and his PICC line removed.  He did not have a follow up echocardiogram while he was in the hospital.  He was discharged home on 07/23/2016 on oral Augmentin with Xarelto for anticoagulation.  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  TEE was performed earlier today and notable for a relatively large mass on the mitral valve c/w likely vegetation.  Cardiothoracic surgical consultation was requested.  Patient is currently awake and alert.  His wife is not at the bedside.  He denies any fever, chills, chest discomfort, or shortness of breath.  His speech is slightly garbled but he does not seem to be confused.  He states that he feels "okay"     Past Medical History:  Diagnosis Date  . Acute CHF (congestive heart failure) ([place]) 11/01/2015  . Acute on chronic diastolic heart failure ([place]) 09/27/2015  . Acute pulmonary embolus ([place]) 2010  . Acute respiratory failure with hypoxia ([place]) 09/09/2015  . Arthritis   . Atrial fibrillation [facility])    s/p inpatient DCCV 09/11/2015  . Atrial flutter ([place])   . CHF (congestive heart failure) ([place])   . Coronary artery disease involving native coronary artery 05/15/2015   multivessel  . Dysrhythmia    A-fib  . Essential hypertension   . Fibromyalgia   . History of kidney stones   .  Hypercholesterolemia   . Hypertension   . Lupus anticoagulant positive 07/29/2008  . Mitral regurgitation 10/30/2015  . PMR (polymyalgia rheumatica) ([facility])   . Pneumonia   . 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 S/P Maze operation for  atrial fibrillation 05/29/2015   Complete bilateral atrial lesion set via median sternotomy using bipolar radiofrequency and cryothermy ablation with clipping of LA appendage  . 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 APPENDECTOMY    . arm surgery  Right    wrist and elbow  . CARDIAC CATHETERIZATION N/A 05/15/2015   Procedure: Left Heart Cath and Coronary Angiography;  Surgeon: Peter M Martinique, MD;  Location: [facility];  Service: Cardiovascular;  Laterality: N/A;  . CARDIAC CATHETERIZATION N/A 11/17/2015   Procedure: Right/Left Heart Cath and Coronary/Graft Angiography;  Surgeon: Adrian Prows, MD;  Location: [facility];  Service: Cardiovascular;  Laterality: N/A;  . CARDIOVERSION N/A 09/11/2015   Procedure: CARDIOVERSION;  Surgeon: Lelon Perla, MD;  Location: [facility] ENDOSCOPY;  Service: Cardiovascular;  Laterality: N/A;  . CHEST TUBE INSERTION  11/21/2015   Procedure: LEFT CHEST TUBE INSERTION;  Surgeon: Rexene Alberts, MD;  Location: [facility];  Service: Open Heart Surgery;;  . CLIPPING OF ATRIAL APPENDAGE  05/29/2015   Procedure: CLIPPING OF LEFT ATRIAL APPENDAGE;  Surgeon: Rexene Alberts, MD;  Location: [facility];  Service: Open Heart Surgery;;  45 AtriClip PRO 145  . CORONARY ARTERY BYPASS GRAFT N/A 05/29/2015   Procedure: CORONARY ARTERY BYPASS GRAFTING (CABG), ON PUMP, TIMES THREE, USING LEFT INTERNAL MAMMARY ARTERY, BILATERAL GREATER SAPHENOUS VEINS HARVESTED ENDOSCOPICALLY;  Surgeon: Rexene Alberts, MD;  Location: [facility];  Service: Open Heart Surgery;  Laterality: N/A;  . FRACTURE SURGERY     right wrist  . HERNIA REPAIR    . MAZE N/A 05/29/2015   Procedure: MAZE;  Surgeon: Rexene Alberts, MD;  Location: [facility];   Service: Open Heart Surgery;  Laterality: N/A;  Complete Bi-Atrial Lesion set with Ablation and Cryothermy  . MITRAL VALVE REPAIR Right 11/21/2015   Procedure: MINIMALLY INVASIVE REOPERATION FOR MITRAL VALVE REPAIR;  Surgeon: Rexene Alberts, MD;  Location: [facility];  Service: Open Heart Surgery;  Laterality: Right;  . PERIPHERAL VASCULAR CATHETERIZATION Bilateral 11/17/2015   Procedure: Renal Angiography;  Surgeon: Adrian Prows, MD;  Location: [facility];  Service: Cardiovascular;  Laterality: Bilateral;  . PERIPHERAL VASCULAR CATHETERIZATION N/A 11/17/2015   Procedure: Abdominal Aortogram;  Surgeon: Adrian Prows, MD;  Location: [facility];  Service: Cardiovascular;  Laterality: N/A;  . SHOULDER SURGERY Left    clavicular fracture  . TEE WITHOUT CARDIOVERSION N/A 05/29/2015   Procedure: TRANSESOPHAGEAL ECHOCARDIOGRAM (TEE);  Surgeon: Rexene Alberts, MD;  Location: [facility];  Service: Open Heart Surgery;  Laterality: N/A;  . TEE WITHOUT CARDIOVERSION N/A 10/30/2015   Procedure: TRANSESOPHAGEAL ECHOCARDIOGRAM (TEE);  Surgeon: Adrian Prows, MD;  Location: [facility];  Service: Cardiovascular;  Laterality: N/A;  . TEE WITHOUT CARDIOVERSION N/A 11/21/2015   Procedure: TRANSESOPHAGEAL ECHOCARDIOGRAM (TEE);  Surgeon: Rexene Alberts, MD;  Location: [facility];  Service: Open Heart  Surgery;  Laterality: N/A;    Family History  Problem Relation Age of Onset  . Cancer Mother   . Other Sister     chf  . Other Sister     Rosalee Kaufman    Social History   Social History  . Marital status: Married    Spouse name: N/A  . Number of children: N/A  . Years of education: N/A   Occupational History  . licensed Chief Financial Officer     self-employed   Social History Main Topics  . Smoking status: Never Smoker  . Smokeless tobacco: Never Used  . Alcohol use No  . Drug use: No  . Sexual activity: Not on file   Other Topics Concern  . Not on file   Social History Narrative   Lives with his wife  (married 1990).  Former Health visitor. Body building/weight lifting, running for exercise.    Prior to Admission medications   Medication Sig Start Date End Date Taking? Authorizing Provider  acetaminophen (TYLENOL) 325 MG tablet Take 2 tablets (650 mg total) by mouth every 6 (six) hours as needed for mild pain (or Fever >/= 101). 07/23/16  Yes Mauricio Gerome Apley, MD  amoxicillin-clavulanate (AUGMENTIN) 875-125 MG tablet Take 1 tablet by mouth 2 (two) times daily. 07/23/16  Yes Mauricio Gerome Apley, MD  diazepam (VALIUM) 10 MG tablet Take 10 mg by mouth at bedtime as needed for anxiety or sleep.    Yes Historical Provider, MD  Multiple Vitamin (MULTIVITAMIN WITH MINERALS) TABS tablet Take 1 tablet by mouth 3 (three) times a week.    Yes Historical Provider, MD  oxyCODONE-acetaminophen (PERCOCET/ROXICET) 5-325 MG tablet Take 1 tablet by mouth 3 (three) times daily. FOR BACK PAIN   Yes Historical Provider, MD  rivaroxaban (XARELTO) 20 MG TABS tablet Take 1 tablet (20 mg total) by mouth daily with supper. 06/17/16  Yes Christina P Rama, MD  tiZANidine (ZANAFLEX) 4 MG tablet Take 2-4 mg by mouth at bedtime as needed for muscle spasms.   Yes Historical Provider, MD  valsartan (DIOVAN) 160 MG tablet Take 1 tablet (160 mg total) by mouth daily. 11/19/15  Yes Adrian Prows, MD  zolpidem (AMBIEN) 10 MG tablet Take 10 mg by mouth at bedtime as needed for sleep.    Yes Historical Provider, MD  metoprolol succinate (TOPROL-XL) 25 MG 24 hr tablet Take 12.5 mg by mouth daily. 05/17/16   Historical Provider, MD  ondansetron (ZOFRAN ODT) 8 MG disintegrating tablet Take 1 tablet (8 mg total) by mouth every 8 (eight) hours as needed for nausea or vomiting. Patient not taking: Reported on 07/26/2016 07/16/16   Carlyle Basques, MD    Current Facility-Administered Medications  Medication Dose Route Frequency Provider Last Rate Last Dose  .  stroke: mapping our early stages of recovery book   Does not apply Once  Toy Baker, MD      . 0.9 %  sodium chloride infusion   Intravenous Continuous Rhetta Mura Schorr, NP 75 mL/hr at 07/26/16 1854    . acetaminophen (TYLENOL) tablet 650 mg  650 mg Oral Q4H PRN Toy Baker, MD   650 mg at 07/25/16 2331   Or  . acetaminophen (TYLENOL) solution 650 mg  650 mg Per Tube Q4H PRN Toy Baker, MD       Or  . acetaminophen (TYLENOL) suppository 650 mg  650 mg Rectal Q4H PRN Toy Baker, MD      . aspirin suppository 300 mg  300 mg Rectal  Daily Toy Baker, MD       Or  . aspirin tablet 325 mg  325 mg Oral Daily Toy Baker, MD   325 mg at 07/26/16 0908  . cefTRIAXone (ROCEPHIN) 2 g in dextrose 5 % 50 mL IVPB  2 g Intravenous Q24H Michel Bickers, MD   2 g at 07/26/16 1824  . MEDLINE mouth rinse  15 mL Mouth Rinse BID Reyne Dumas, MD   15 mL at 07/26/16 2108  . oxyCODONE-acetaminophen (PERCOCET/ROXICET) 5-325 MG per tablet 1 tablet  1 tablet Oral Q8H PRN Adrian Prows, MD   1 tablet at 07/26/16 2103  . zolpidem (AMBIEN) tablet 10 mg  10 mg Oral QHS PRN Adrian Prows, MD   10 mg at 07/26/16 2103    Allergies  Allergen Reactions  . Amitriptyline Shortness Of Breath  . Serotonin Reuptake Inhibitors (Ssris) Nausea And Vomiting      Review of Systems:  Per HPI and review of chart     Physical Exam:   BP (!) 129/91 (BP Location: Left Arm)   Pulse 97   Temp 100 F (37.8 C) (Oral)   Resp (!) 22   Ht 5\' 10"  (1.778 m)   Wt 194 lb 14.2 oz (88.4 kg)   SpO2 100%   BMI 27.96 kg/m   General:  Somewhat weak and ill-appearing but in NAD  HEENT:  Unremarkable   Neck:   no JVD, no bruits, no adenopathy   Chest:   Few scattered rhonchi  CV:   RRR, no  murmur   Abdomen:  soft, non-tender, no masses   Extremities:  warm, well-perfused, pulses thready but palpable, no lower extremity edema  Rectal/GU  Deferred  Neuro:   Grossly non-focal and symmetrical throughout  Skin:   Clean and dry, no rashes, no breakdown  Diagnostic  Tests:   Recent Labs  07/25/16 1400 07/26/16 0345  WBC 21.1* 18.6*  HGB 10.1* 9.8*  HCT 31.7* 31.1*  PLT 95* 110*   BMET:  Recent Labs  07/25/16 1400 07/26/16 1245  NA 134* 138  K 4.0 3.8  CL 105 109  CO2 21* 21*  GLUCOSE 109* 118*  BUN 29* 27*  CREATININE 1.51* 1.17  CALCIUM 8.2* 8.2*    CBG (last 3)  No results for input(s): GLUCAP in the last 72 hours. PT/INR:   Recent Labs  07/25/16 0742  LABPROT 18.3*  INR 1.50    CXR:  N/A   MRI HEAD WITHOUT CONTRAST  MRA HEAD WITHOUT CONTRAST  TECHNIQUE: Multiplanar, multiecho pulse sequences of the brain and surrounding structures were obtained without intravenous contrast. Angiographic images of the head were obtained using MRA technique without contrast.  COMPARISON:  Brain MRI 12/30/2016  FINDINGS: MRI HEAD FINDINGS  Brain: There is diffusion restriction throughout the lower left cerebellum, in the distribution of the left posterior inferior cerebellar artery. There are multiple other areas of high signal on diffusion-weighted imaging in the posterior right temporal lobe and lateral left temporal lobe, as well as in the periatrial white matter of the right parietal lobe. ADC values at these locations are relatively normal, likely indicating that these are areas of subacute ischemia. There is hyperintense T2 weighted signal within the left cerebellum, left occipital lobe, left temporal lobe and within the periventricular white matter. A No mass lesion or midline shift. No hydrocephalus or extra-axial fluid collection. The midline structures are normal. No age advanced or lobar predominant atrophy.  Vascular: Major intracranial arterial and venous sinus  flow voids are preserved. Two foci of punctate chronic microhemorrhage of the left basal ganglia.  Skull and upper cervical spine: The visualized skull base, calvarium, upper cervical spine and extracranial soft tissues  are normal.  Sinuses/Orbits: No fluid levels or advanced mucosal thickening. No mastoid effusion. Normal orbits.  MRA HEAD FINDINGS  Intracranial internal carotid arteries: Normal.  Anterior cerebral arteries: Congenitally absent left A1 segment. Otherwise normal.  Middle cerebral arteries: Normal.  Posterior communicating arteries: Absent bilaterally.  Posterior cerebral arteries: Normal.  Basilar artery: Normal.  Vertebral arteries: Right-dominant. There is minimal flow related enhancement within the left vertebral artery.  Superior cerebellar arteries: Normal.  Anterior inferior cerebellar arteries: Normal.  Posterior inferior cerebellar arteries: Normal on the right. Left PICA is not visualized and likely occluded.  IMPRESSION: 1. 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 No associated acute hemorrhage or mass effect. 2. Scattered areas of DWI abnormality in the left temporal lobe, right temporal lobe and right parietal lobe are favored to be areas of subacute ischemia. The distribution is again suggestive of a central embolic process, though likely occurring at a different time from the current acute left PICA infarct. 3. 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   Electronically Signed   By: Ulyses Jarred M.D.   On: 07/25/2016 03:15    Transesophageal Echocardiography  Patient:    Taron, Mondor MR #:       [id number] Study Date: 07/26/2016 Gender:     M Age:        72 Height:     177.8 cm Weight:     88.4 kg BSA:        2.11 m^2 Pt. Status: Room:       4N19C   PERFORMING   Adrian Prows, MD  SONOGRAPHER  Florentina Jenny, RDCS  ORDERING     Kela Millin R  cc:  ------------------------------------------------------------------- LV EF: 40% -    45%  ------------------------------------------------------------------- Indications:      CVA 37.  ------------------------------------------------------------------- History:   PMH:   Mitral valve disease.  ------------------------------------------------------------------- Study Conclusions  - Left ventricle: Systolic function was mildly to moderately   reduced. The estimated ejection fraction was in the range of 40%   to 45%. The intraventricular septum displays 8 shaped septum both   in systole and diastole and may suggest elevated right heart20AC201Cher.   Septal motion may also be consistent with post thoracotomy state.   Left ventricle is not well visualized due to reverberation   artifact. - Aortic valve: There was mild regurgitation. - Mitral valve: The mitral valve ring sutures are well preserved.   There is a very large 3.7 x 1.5 cm vegetation noted on the   anterior mitral leaflet, encompassing the entire anterior mitral   leaflet. There is one small second-degree vegetation that is   highly mobile that is very tiny and small on the atrial side.   There is very mild posterior directed mitral regurgitation. There   is mild to moderate mitral stenosis, mean gradient 10 mmHg,   calculated aortic valve area by pressure halftime is 1.9 cm^2.   Valve area by pressure half-time: 1.91 cm^2. - Left atrium: The left atrial appendage is clipped. There is no   residual thrombus on the left atrial appendage. There is no smoke   in the left atrium. The atrium was dilated. - Right ventricle: The cavity size was moderately dilated.  Systolic   function was moderately reduced. - Tricuspid valve: No evidence of vegetation. - Pulmonic valve: No evidence of vegetation. - Pulmonary arteries: PA peak pressure: 42 mm Hg (S). - Pericardium, extracardiac: There was a right pleural effusion.  ------------------------------------------------------------------- Study data:   Study  status:  Routine.  Consent:  The risks, benefits, and alternatives to the procedure were explained to the pa tient and informed consent was obtained.  Procedure:  The patient re ported no pain pre or post test. Initial setup. The patient was brou ght to the laboratory. Surface ECG leads were monitored. Sedation. C onscious sedation was administered by cardiology staff. Transesophag eal echocardiography. An adult multiplane transesophageal probe was inserted by the attending cardiologistwithout difficulty. Image qual ity was adequate.  Study completion:  The patient tolerated the proc edure well. There were no complications.  Administered medications:   Fentanyl, 73mcg, IV.  Midazolam, 2mg , IV.          Diagnostic tran sesophageal echocardiography.  2D and color Doppler.  Birthdate:  Pa tient birthdate: [birth year].  Age:  Patient is 66 yr old.  Sex:  Gen der: male.    BMI: 28 kg/m^2.  Blood pressure:     153/107  Patient status:  Inpatient.  Study date:  Study date: 07/26/2016. Study time : 03:58 PM.  Location:  Endoscopy.  -------------------------------------------------------------------  ------------------------------------------------------------------- Left ventricle:  Poorly visualized. Systolic function was mildly to moderately reduced. The estimated ejection fraction was in the range  of 40% to 45%. The intraventricular septum displays 8 shaped septum  both in systole and diastole and may suggest elevated right heart20AC201Ch er. Septal motion may also be consistent with post thoracotomy state . Left ventricle is not well visualized due to reverberation artifac t.  ------------------------------------------------------------------- Aortic valve:   Trileaflet; mildly thickened leaflets.  Doppler: There was mild regurgitation.  ------------------------------------------------------------------- Aorta:  The aorta was well visualized, not dilated, and mildly diseased. Aortic  root: The aortic root had no disease. Ascending aorta: The ascending aorta had minor luminal irregularities. Aortic arch: The aortic arch had mild diffuse disease.  ------------------------------------------------------------------- Mitral valve:  The mitral valve ring sutures are well preserved. There is a very large 3.7 x 1.5 cm vegetation noted on the anterior mitral leaflet, encompassing the entire anterior mitral leaflet. The re is one small second-degree vegetation that is highly mobile that is very tiny and small on the atrial side. There is very mild poster ior directed mitral regurgitation. There is mild to moderate mitral stenosis, mean gradient 10 mmHg, calculated aortic valve area by pre ssure halftime is 1.9 cm^2.  Doppler:     Valve area by pressure hal f-time: 1.91 cm^2. Indexed valve area by pressure half-time: 0.91 cm ^2/m^2.    Mean gradient (D): 10 mm Hg.  ------------------------------------------------------------------- Left atrium:  The left atrial appendage is clipped. There is no residual thrombus on the left atrial appendage. There is no smoke in  the left atrium. Not well visualized. The atrium was dilated. The a ppendage was well visualized.  ------------------------------------------------------------------- Right ventricle:  The cavity size was moderately dilated. Systolic function was moderately reduced.  ------------------------------------------------------------------- Pulmonic valve:    Structurally normal valve.   Cusp separation was normal.  No evidence of vegetation.  Doppler:  There was trivial reg urgitation.  ------------------------------------------------------------------- Tricuspid valve:   Structurally normal valve.   Leaflet separation was normal.  No evidence of vegetation.  Doppler:  There was mild re gurgitation.  ------------------------------------------------------------------- Pericardium:  The pericardium  was normal in  appearance. There was no pericardial effusion.  ------------------------------------------------------------------- Pleura:  There was a right pleural effusion.  ------------------------------------------------------------------- Post procedure conclusions Ascending Aorta:  - The aorta was well visualized, not dilated, and mildly diseased.  ------------------------------------------------------------------- Measurements   Mitral valve                          Value          Reference  Mitral mean velocity, D               144   cm/s     ---------  Mitral pressure half-time             115   ms       ---------  Mitral mean gradient, D               10    mm Hg    ---------  Mitral valve area, PHT, DP            1.91  cm^2     ---------  Mitral valve area/bsa, PHT, DP        0.91  cm^2/m^2 ---------  Mitral annulus VTI, D                 67.9  cm       ---------    Pulmonary arteries                    Value          Reference  PA pressure, S, DP             (H)    42    mm Hg    <=30    Tricuspid valve                       Value          Reference  Tricuspid regurg peak velocity        314   cm/s     ---------  Tricuspid peak RV-RA gradient         39    mm Hg    ---------  Legend: (L)  and  (H)  mark values outside specified reference range.  ------------------------------------------------------------------- Prepared and Electronically Authenticated by  Adrian Prows, MD 2018-03-19T18:30:48    Impression:  Mr Lenn is well known to me from previous CABG + maze procedure in January 2017 followed by minimally invasive mitral valve repair 6 months later.  He was last seen in our office on 02/16/2016 at which time he was doing remarkably well, maintaining sinus rhythm, and chronically anticoagulated using warfarin.  Recent events outlined above.  I have personally reviewed the patient's TEE performed earlier today as well as the TTE performed 1 month ago.  The patient has  a large mass adherent to the atrial surface of the mitral valve c/w vegetation due to persistent prosthetic valve endocarditis.  However, I am concerned by the appearance that this may not be just vegetation but possibly a combination of vegetation and thrombus.   At this time the annuloplasty ring remains intact and there is only mild mitral regurgitation.  The patient does not have signs nor symptoms of CHF.  Blood cultures remain no growth so far.     Plan:  Unfortunately, Mr. Kolodziejski prognosis is not very good.   I would favor continued intravenous  antibiotics indefinitely until TEE reveals that the vegetation has resolved.  In addition, I would recommend resuming systemic anticoagulation using heparin followed by warfarin as soon as his risks of intracranial bleeding are felt to be acceptably low.  This may not work and he may be at significant risk of recurrent emboli, bleeding due to anticoagulation, and/or the development of congestive heart failure from worsening mitral regurgitation.  He is not presently a candidate for surgical intervention.   However, I might be willing to reconsider very high risk surgery in the future if he does well with respect to recovery from his stroke but still shows signs of persistent PVE unresponsive to medical therapy.  There is no question that risks associated with 3rd time redo mitral valve replacement would be exceptionally high.    I spent in excess of 30 minutes during the conduct of this hospital encounter and >50% of this time involved direct face-to-face encounter with the patient for counseling and/or coordination of their care.    Rexene Alberts, MD 07/26/2016 6:48 PM

## 2016-07-26 NOTE — CV Procedure (Signed)
TEE: Under moderate sedation, TEE was performed without complications: LV: left ventricle is not well visualized due to  reverberation artifact. Left ankle systolic function appears to be mildly depressed around 45% with global hypokinesis. Septal motion is consistent with right ventricle pressure and volume overload pattern. RV: moderately enlarged, moderately hypokinetic. LA: enlarged. Left atrial appendage: nonexistent, has been clipped. There is no visible thrombus at the site of the left atrial appendage. Interatrial septum appears to be intact. RA: Normal MV: mitral valve ring appears to be intact without paravalvular leak. There is a very large vegetation involving the entire mitral valve leaflet measuring about 3.7 x 1.5 cm. There is a posteriorly directed very mild mitral regurgitation. Mean gradient across the mitral valve is 10 mmHg, mitral valve area by pressure halftime is 1.9 cm, findings consistent with mild to moderate mitral stenosis. TV: No vegetation, mild to moderate tricuspid regurgitation. Moderate pulmonary hypertension. PA pressure at least 40 mmHg. AV: No vegetation. Right cuspid. Very mild aortic regurgitation.Marland Kitchen PV: Normal. Trace PI.  Thoracic and ascending aorta: aortic root and ascending  aorta shows very minimal plaque.arch of the aorta shows mild to moderate plaque without complex plaque.  Impression: The cerebrovascular accident is more consistent with septic emboli with complete degeneration  Of the mitral valve leaflet.  Conscious sedation protocol was followed, I personally administered conscious sedation and monitored the patient. Patient received 2 milligrams of Versed and 25 mcg fentanyl . Patient tolerated the procedure well and there was no complication from conscious sedation. Time administered was 14 minutes.

## 2016-07-26 NOTE — Progress Notes (Signed)
   07/26/16 1000  Clinical Encounter Type  Visited With Patient  Visit Type Initial  Referral From Nurse  Consult/Referral To Chaplain  Stress Factors  Patient Stress Factors None identified  Pt is asleep, not available at this time, no family present. Will follow up as needed.   [facility]  [phone]

## 2016-07-26 NOTE — Interval H&P Note (Signed)
History and Physical Interval Note:  07/26/2016 4:03 PM  Peter Bruce  has presented today for surgery, with the diagnosis of stroke  The various methods of treatment have been discussed with the patient and family. After consideration of risks, benefits and other options for treatment, the patient has consented to  Procedure(s): TRANSESOPHAGEAL ECHOCARDIOGRAM (TEE) (N/A) as a surgical intervention .  The patient's history has been reviewed, patient examined, no change in status, stable for surgery.  I have reviewed the patient's chart and labs.  Questions were answered to the patient's satisfaction.     Adrian Prows

## 2016-07-26 NOTE — Evaluation (Signed)
Occupational Therapy Evaluation Patient Details Name: Peter Bruce MRN: [id number] DOB: 04-03-1951 Today's Date: 07/26/2016    History of Present Illness Peter Bruce is a 66 y.o. 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 Recently discharged from hospital for Pna and confusion. Found this admission to have Occlusion of the left posterior inferior cerebellar artery with associated acute infarct of the inferior left cerebellar hemisphere   Clinical Impression   This 66 yo male admitted with above presents to acute OT with deficits below ( see OT problem list) thus affecting his PLOF of being totally independent with basic ADLs and most IADLs. He will benefit from acute OT without need for follow up as long as his wife will be with him 24/7 S and prn A.    Follow Up Recommendations  No OT follow up;Supervision/Assistance - 24 hour    Equipment Recommendations  None recommended by OT       Precautions / Restrictions Precautions Precautions: Fall (due to fatigue)      Mobility Bed Mobility   Bed Mobility: Rolling;Sidelying to Sit Rolling: Min assist Sidelying to sit: Min assist          Transfers Overall transfer level: Needs assistance Equipment used: Rolling walker (2 wheeled) Transfers: Sit to/from Stand Sit to Stand: Min assist              Balance Overall balance assessment: Needs assistance Sitting-balance support: Feet supported;No upper extremity supported Sitting balance-Leahy Scale: Good     Standing balance support: Bilateral upper extremity supported Standing balance-Leahy Scale: Poor Standing balance comment: requiring RW currently                            ADL Overall ADL's : Needs assistance/impaired Eating/Feeding: Independent;Sitting   Grooming: Set up;Sitting   Upper Body  Bathing: Set up;Sitting   Lower Body Bathing: Moderate assistance (min A sit<>stand)   Upper Body Dressing : Set up;Sitting   Lower Body Dressing: Maximal assistance (min A sit<>stand)   Toilet Transfer: Minimal assistance;Ambulation;RW   Toileting- Clothing Manipulation and Hygiene: Minimal assistance;Sit to/from stand               Vision Patient Visual Report: No change from baseline Vision Assessment?: Yes Eye Alignment: Within Functional Limits Ocular Range of Motion: Within Functional Limits Alignment/Gaze Preference: Within Defined Limits Tracking/Visual Pursuits: Able to track stimulus in all quads without difficulty Convergence: Within functional limits Visual Fields: No apparent deficits            Pertinent Vitals/Pain Pain Assessment: No/denies pain     Hand Dominance Right   Extremity/Trunk Assessment Upper Extremity Assessment Upper Extremity Assessment: Generalized weakness (No ataxic movements noted of UEs with testing or observation)   Lower Extremity Assessment Lower Extremity Assessment: Defer to PT evaluation       Communication Communication Communication: No difficulties   Cognition Arousal/Alertness: Awake/alert Behavior During Therapy: Flat affect Overall Cognitive Status: Within Functional Limits for tasks assessed                                Home Living Family/patient expects to be discharged to:: Private residence Living Arrangements: Spouse/significant other Available Help at Discharge: Family;Available 24 hours/day Type of Home: [place]  Access: Stairs to enter [employer] of Steps: 3 Entrance Stairs-Rails: Right;Left Home Layout: One level     Bathroom Shower/Tub: Occupational psychologist: Standard     Home Equipment: Environmental consultant - 2 wheels;Cane - single point;Shower seat - built in;Hand held shower head;Grab bars - tub/shower          Prior Functioning/Environment Level of  Independence: Independent        Comments: Very active.  Drives. 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, music Problem List: Decreased strength;Decreased activity tolerance;Pain;Impaired balance (sitting and/or standing)      OT Treatment/Interventions: Self-care/ADL training;Patient/family education;Balance training    OT Goals(Current goals can be found in the care plan section) Acute Rehab OT Goals Patient Stated Goal: less pain in genital area (swollen) OT Goal Formulation: With patient/family Time For Goal Achievement: 08/02/16 Potential to Achieve Goals: Good  OT Frequency: Min 2X/week           Co-evaluation PT/OT/SLP Co-Evaluation/Treatment: Yes Reason for Co-Treatment: To address functional/ADL transfers;For patient/therapist safety   OT goals addressed during session: ADL's and self-care;Strengthening/ROM      End of Session Equipment Utilized During Treatment: Gait belt;Rolling walker Nurse Communication:  (OK'd to turn O2 down from 3 liters)  Activity Tolerance: Patient limited by fatigue Patient left: in chair;with call bell/phone within reach;with chair alarm set;with family/visitor present  OT Visit Diagnosis: Unsteadiness on feet (R26.81);Pain Pain - part of body:  (groin--swollen)                ADL either performed or assessed with clinical judgement  Time: 1128-1203 OT Time Calculation (min): 35 min Charges:  OT General Charges $OT Visit: 1 Procedure OT Evaluation $OT Eval Moderate Complexity: 1 Procedure [place], OTR/L [phone] 07/26/2016

## 2016-07-26 NOTE — Progress Notes (Signed)
STROKE TEAM PROGRESS NOTE   SUBJECTIVE (INTERVAL HISTORY) Wife at bedside. Dr. Megan Salon from [place] just saw. Discussed case with Dr. Leonie Man.    OBJECTIVE Temp:  [98 F (36.7 C)-99.1 F (37.3 C)] 98 F (36.7 C) (03/19 0805) Pulse Rate:  [73-102] 92 (03/19 1000) Cardiac Rhythm: Normal sinus rhythm (03/19 0800) Resp:  [13-32] 32 (03/19 1000) BP: (110-160)/(69-126) 110/69 (03/19 1000) SpO2:  [89 %-99 %] 89 % (03/19 1000)  CBC:   Recent Labs Lab 07/24/16 2008  07/25/16 1400 07/26/16 0345  WBC 18.6*  < > 21.1* 18.6*  NEUTROABS 15.7*  --  17.5*  --   HGB 9.6*  < > 10.1* 9.8*  HCT 30.0*  < > 31.7* 31.1*  MCV 95.5  < > 95.2 95.1  PLT 72*  < > 95* 110*  < > = values in this interval not displayed.  Basic Metabolic Panel:   Recent Labs Lab 07/24/16 2008  07/25/16 0832 07/25/16 1400  NA 136  < > 137 134*  K 4.2  < > 4.4 4.0  CL 104  < > 106 105  CO2 24  --  20* 21*  GLUCOSE 124*  < > 92 109*  BUN 36*  < > 33* 29*  CREATININE 1.66*  < > 1.38* 1.51*  CALCIUM 8.0*  --  8.2* 8.2*  MG 2.0  --  2.0  --   < > = values in this interval not displayed.  Lipid Panel:     Component Value Date/Time   CHOL 170 06/14/2016 0235   TRIG 133 06/14/2016 0235   HDL 24 (L) 06/14/2016 0235   CHOLHDL 7.1 06/14/2016 0235   VLDL 27 06/14/2016 0235   LDLCALC 119 (H) 06/14/2016 0235   HgbA1c:  Lab Results  Component Value Date   HGBA1C 5.7 (H) 06/14/2016   Urine Drug Screen: No results found for: LABOPIA, COCAINSCRNUR, LABBENZ, AMPHETMU, THCU, [place]       TEE  07/26/16: MV: mitral valve ring appears to be intact without paravalvular leak. There is a very large vegetation involving the entire mitral valve leaflet measuring about 3.7 x 1.5 cm. There is a posteriorly directed very mild mitral regurgitation. Mean gradient across the mitral valve is 10 mmHg, mitral valve area by pressure halftime is 1.9 cm, findings consistent with mild to moderate mitral stenosis.   PHYSICAL EXAM Physical  Exam General - Well nourished, well developed, not in distress  Cardiovascular - irregular rate and rhythm; III/VI murmur Pulmonary: CTA Abdomen: NT, ND, normal bowel sounds Extremities: No C/C; positive for edema right>left LE's  Neurological Exam Mental Status: awake alert; follows commands; flat affect Orientation:  Oriented to person, place and year; not month despite several cues patient answered "May" which is his birth month Speech:  Fluent; slight scanning dysarthria  Cranial Nerves:  PERRL; EOMI; visual fields full to threat, face grossly symmetric, hearing grossly intact; shrug symmetric and tongue midline  Motor Exam:  Tone:  Within normal limits; Strength: 5/5 in UEs; patient states bilateral legs feel heavy since he has increased edema and pain in legs.  Exam is 3/5 in LEs bilaterally but effort is poor  Sensory: Intact to light touch throughout  Coordination:  Intact finger to nose  Gait: Deferred  ASSESSMENT/PLAN [name] is a 66 y.o. male with history of previous strokes, atrial fibrillation, endocarditis, minimally invasive mitral valve repair, maze operation for atrial fibrillation, CABG 3, lupus anticoagulant positive, hypertension, hyperlipidemia, coronary artery disease, congestive heart failure,  and pulmonary embolus, presenting with slurred speech, difficult ambulation, bradycardia, and hypotension. He did not receive IV t-PA due to anticoagulation and recent strokes.  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 rule out  and mitral valve endocarditis as possible source with TEE today  Resultant  Mild dysarthria  Discussed with Neurosurgery Saint[facility] Halsted); no surgical invention needed on admission  MRI - acute infarct of the inferior left cerebellar hemisphere.  MRA - occlusion of the left posterior inferior  cerebellar artery. Suspected severe stenosis of the Lt [place].  CT 3/18 No change. Acute left PICA infarction (L inferior cerebellum).   Repeat CT head in am to check edema  Carotid Doppler - 2/6/12018 - Bilateral - 1% to 39% ICA stenosis lowest end of scale. Vertebral artery flow is antegrade.  2D Echo - pending   TEE pending   LDL - 119 (06/14/2016)  HgbA1c - 5.7 (06/14/2016)  VTE prophylaxis - SCDs DIET DYS 3 Room service appropriate? Yes; Fluid consistency: Thin  Xarelto (rivaroxaban) daily prior to admission, now on aspirin 300 mg suppository daily  Patient counseled to be compliant with his antithrombotic medications  Ongoing aggressive stroke risk factor management  Therapy recommendations: HH OT, PT pending   Continue ICU monitoring for cerebral edema  Disposition: Pending  Atrial Fibrillation  Home anticoagulation:  Xarelto (rivaroxaban) daily MAZE procedure and LAA closure during CABG 05/2015  resume anticoagulation if endocarditis fully resoved   Native Valve Endocarditis  History of minimally invasive mitral valve repair on 11/21/2015  Off abx as of yesterday  ID saw, do not suspect recurrent endocarditis, nor that endocarditis was source of stroke  Hypertension  Stable  Recommend permissive hypertension as medically tolerated given cardiac disease (OK if < 220/120) but gradually normalize in 5-7 days  Long-term BP goal normotensive  Hyperlipidemia  Home meds: No lipid lowering medications prior to admission  LDL 119, goal < 70  Add statin when PO access is available  Continue statin at discharge  Other Stroke Risk Factors  Advanced age  Overweight, Body mass index is 27.96 kg/m., recommend weight loss, diet and exercise as appropriate   Hx stroke/TIA  Coronary artery disease - Atherosclerosis of native coronary artery of native heart with angina pectoris S/P CABG x 3 on 05/29/2015 withLIMA to LAD, SVG to OM2, SVG to RCA; and Maze procedure and  clipping of left atrial appendage.  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 Agree with note above.  Patient presented with dysarthria and MRI scan shows small subacute white matter infarcts as well as large left posterior inferior cerebral artery infarct with mild edema. Possibilities include embolic stroke from atrial fibrillation versus septic emboli from endocarditis which apparently appears to be adequately treated as per Dr. Megan Salon. Long discussion the bedside with the patient's family and answered questions.  TEE from today shows large mitral valve vegetation making endocarditis more likely etiology for his embolic strokes. Discuss case again with Dr Einar Gip and Megan Salon and patient will need to go back on antibiotics. Plan to consult cardiothoracic surgery to consider surgical treatment and replacement of the infected valve. This patient is critically ill and at significant risk of neurological worsening, death and care requires constant  monitoring of vital signs, hemodynamics,respiratory and cardiac monitoring, extensive review of multiple databases, frequent neurological assessment, discussion with family, other specialists and medical decision making of high complexity.I have made any additions or clarifications directly to the above note.This critical care time does not reflect procedure time, or teaching time or supervisory time of PA/NP/Med Resident etc but could involve care discussion time.  I spent 35 minutes of neurocritical care time  in the care of  this patient.    Antony Contras, MD Medical Director [place] Pager: [phone] 07/26/2016 5:06 PM  To contact Stroke Continuity provider, please refer to [email or web address] After hours, contact General Neurology

## 2016-07-26 NOTE — Care Management Note (Addendum)
Case Management Note  Patient Details  Name: Peter Bruce MRN: [id number] Date of Birth: 02/02/1951  Subjective/Objective:     Pt admitted with a new stroke - recently discharged with endocarditis        Action/Plan:   PTA from home with wife - wife will provide 24 hours supervision - pt uses walker.  PT recommending HHPT wife in agreement/ wife also requested [facility] for disease management - pt is off unit at procedure - no orders written yet however CM will request them from physician via sticky notes.  Wife states they are familiar with [facility] and would like to utilize them for [facility] at this time.    CM will continue to follow for discharge needs  3/21 14:47 Addendum Carles Collet RN CM. Referral made to [facility] to follow for IV med needs after DC, and [facility] for [facility] needs after DC. CM will continue to follow.   Expected Discharge Date:  07/29/16               Expected Discharge Plan:  [place]  In-House Referral:     Discharge planning Services  CM Consult  Post Acute Care Choice:    Choice offered to:  Patient  DME Arranged:    DME Agency:     HH Arranged:    [place] Agency:     Status of Service:  In process, will continue to follow  If discussed at Long Length of Stay Meetings, dates discussed:    Additional Comments:  Maryclare Labrador, RN 07/26/2016, 3:13 PM

## 2016-07-27 ENCOUNTER — Encounter (HOSPITAL_COMMUNITY): Payer: Self-pay | Admitting: Cardiology

## 2016-07-27 ENCOUNTER — Inpatient Hospital Stay (HOSPITAL_COMMUNITY): Payer: Medicare Other

## 2016-07-27 DIAGNOSIS — I058 Other rheumatic mitral valve diseases: Secondary | ICD-10-CM

## 2016-07-27 MED ORDER — ONDANSETRON 4 MG PO TBDP
8.0000 mg | ORAL_TABLET | Freq: Three times a day (TID) | ORAL | Status: DC | PRN
  Administered 2016-07-27: 8 mg via ORAL
  Filled 2016-07-27: qty 2

## 2016-07-27 MED ORDER — VANCOMYCIN 50 MG/ML ORAL SOLUTION
125.0000 mg | Freq: Four times a day (QID) | ORAL | Status: DC
  Administered 2016-07-27 – 2016-07-29 (×9): 125 mg via ORAL
  Filled 2016-07-27 (×12): qty 2.5

## 2016-07-27 MED ORDER — SACCHAROMYCES BOULARDII 250 MG PO CAPS
250.0000 mg | ORAL_CAPSULE | Freq: Two times a day (BID) | ORAL | Status: DC
  Administered 2016-07-27 – 2016-07-29 (×5): 250 mg via ORAL
  Filled 2016-07-27 (×5): qty 1

## 2016-07-27 NOTE — Progress Notes (Signed)
STROKE TEAM PROGRESS NOTE   SUBJECTIVE (INTERVAL HISTORY) No family at bedside. Dr. Megan Salon  .discussed case with Dr. Leonie Man.    OBJECTIVE Temp:  [98.4 F (36.9 C)-100 F (37.8 C)] 99 F (37.2 C) (03/20 1600) Pulse Rate:  [62-103] 92 (03/20 2000) Cardiac Rhythm: Normal sinus rhythm (03/20 1600) Resp:  [15-27] 15 (03/20 2000) BP: (109-163)/(74-116) 115/76 (03/20 2000) SpO2:  [88 %-100 %] 100 % (03/20 2000)  CBC:   Recent Labs Lab 07/24/16 2008  07/25/16 1400 07/26/16 0345  WBC 18.6*  < > 21.1* 18.6*  NEUTROABS 15.7*  --  17.5*  --   HGB 9.6*  < > 10.1* 9.8*  HCT 30.0*  < > 31.7* 31.1*  MCV 95.5  < > 95.2 95.1  PLT 72*  < > 95* 110*  < > = values in this interval not displayed.  Basic Metabolic Panel:   Recent Labs Lab 07/24/16 2008  07/25/16 0832 07/25/16 1400 07/26/16 1245  NA 136  < > 137 134* 138  K 4.2  < > 4.4 4.0 3.8  CL 104  < > 106 105 109  CO2 24  --  20* 21* 21*  GLUCOSE 124*  < > 92 109* 118*  BUN 36*  < > 33* 29* 27*  CREATININE 1.66*  < > 1.38* 1.51* 1.17  CALCIUM 8.0*  --  8.2* 8.2* 8.2*  MG 2.0  --  2.0  --   --   < > = values in this interval not displayed.  Lipid Panel:     Component Value Date/Time   CHOL 170 06/14/2016 0235   TRIG 133 06/14/2016 0235   HDL 24 (L) 06/14/2016 0235   CHOLHDL 7.1 06/14/2016 0235   VLDL 27 06/14/2016 0235   LDLCALC 119 (H) 06/14/2016 0235   HgbA1c:  Lab Results  Component Value Date   HGBA1C 5.7 (H) 06/14/2016   Urine Drug Screen: No results found for: LABOPIA, COCAINSCRNUR, LABBENZ, AMPHETMU, THCU, [place]       TEE  07/26/16: MV: mitral valve ring appears to be intact without paravalvular leak. There is a very large vegetation involving the entire mitral valve leaflet measuring about 3.7 x 1.5 cm. There is a posteriorly directed very mild mitral regurgitation. Mean gradient across the mitral valve is 10 mmHg, mitral valve area by pressure halftime is 1.9 cm, findings consistent with mild to moderate  mitral stenosis.   PHYSICAL EXAM Physical Exam General - Well nourished, well developed, not in distress  Cardiovascular - irregular rate and rhythm; III/VI murmur Pulmonary: CTA Abdomen: NT, ND, normal bowel sounds Extremities: No C/C; positive for edema right>left LE's  Neurological Exam Mental Status: awake alert; follows commands; flat affect Orientation:  Oriented to person, place and year; not month despite several cues patient answered "Peter Bruce" which is his birth month Speech:  Fluent; slight scanning dysarthria  Cranial Nerves:  PERRL; EOMI; visual fields full to threat, face grossly symmetric, hearing grossly intact; shrug symmetric and tongue midline  Motor Exam:  Tone:  Within normal limits; Strength: 5/5 in UEs; patient states bilateral legs feel heavy since he has increased edema and pain in legs.  Exam is 3/5 in LEs bilaterally but effort is poor  Sensory: Intact to light touch throughout  Coordination:  Intact finger to nose  Gait: Deferred  ASSESSMENT/PLAN Peter Bruce is a 66 y.o. male with history of previous strokes, atrial fibrillation, endocarditis, minimally invasive mitral valve repair, maze operation for atrial fibrillation, CABG 3,  lupus anticoagulant positive, hypertension, hyperlipidemia, coronary artery disease, congestive heart failure, and pulmonary embolus, presenting with slurred speech, difficult ambulation, bradycardia, and hypotension. He did not receive IV t-PA due to anticoagulation and recent strokes.  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 rule out  and mitral valve endocarditis as possible source with TEE today  Resultant  Mild dysarthria  Discussed with Neurosurgery Saintclair [place]sted); no surgical invention needed on admission  MRI - acute infarct of the inferior left cerebellar hemisphere.  MRA -  occlusion of the left posterior inferior cerebellar artery. Suspected severe stenosis of the Lt [place].  CT 3/18 No change. Acute left PICA infarction (L inferior cerebellum).   Repeat CT head in am to check edema  Carotid Doppler - 2/6/12018 - Bilateral - 1% to 39% ICA stenosis lowest end of scale. Vertebral artery flow is antegrade.  2D Echo - pending   TEE pending   LDL - 119 (06/14/2016)  HgbA1c - 5.7 (06/14/2016)  VTE prophylaxis - SCDs DIET DYS 3 Room service appropriate? Yes; Fluid consistency: Thin  Xarelto (rivaroxaban) daily prior to admission, now on aspirin 300 mg suppository daily  Patient counseled to be compliant with his antithrombotic medications  Ongoing aggressive stroke risk factor management  Therapy recommendations: HH OT, PT pending   Continue ICU monitoring for cerebral edema  Disposition: Pending  Atrial Fibrillation  Home anticoagulation:  Xarelto (rivaroxaban) daily MAZE procedure and LAA closure during CABG 05/2015  resume anticoagulation if endocarditis fully resoved   Native Valve Endocarditis  History of minimally invasive mitral valve repair on 11/21/2015  Off abx as of yesterday  ID saw, do not suspect recurrent endocarditis, nor that endocarditis was source of stroke  Hypertension  Stable  Recommend permissive hypertension as medically tolerated given cardiac disease (OK if < 220/120) but gradually normalize in 5-7 days  Long-term BP goal normotensive  Hyperlipidemia  Home meds: No lipid lowering medications prior to admission  LDL 119, goal < 70  Add statin when PO access is available  Continue statin at discharge  Other Stroke Risk Factors  Advanced age  Overweight, Body mass index is 27.96 kg/m., recommend weight loss, diet and exercise as appropriate   Hx stroke/TIA  Coronary artery disease - Atherosclerosis of native coronary artery of native heart with angina pectoris S/P CABG x 3 on 05/29/2015 withLIMA to LAD, SVG  to OM2, SVG to RCA; and Maze procedure and clipping of left atrial appendage.  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 Agree with note above.    Discussed case again with Dr Einar Gip and Megan Salon and patient started on Ceftriaxone and vancomycin now for 4-6 weeks and will need new PICC line. Plan to  discharge  home in the next few days a Hopefully the mitral valve vegetation response to.antibiotics. Surgical options for mitral valve replacement are on hold at the moment  .This patient is critically ill and at significant risk of neurological worsening, death and care requires constant monitoring of vital signs,hemodynamics,respiratory and cardiac monitoring, extensive review of multiple databases, frequent neurological assessment, discussion with family, other specialists and medical decision making of high complexity.I have made any additions or clarifications directly to the above note.This critical care time does not reflect procedure time, or  teaching time or supervisory time of PA/NP/Med Resident etc but could involve care discussion time.  I spent 30 minutes of neurocritical care time  in the care of  this patient.    Antony Contras, MD Medical Director [facility] Pager: [phone] 07/27/2016 8:10 PM  To contact Stroke Continuity provider, please refer to [email or web address] After hours, contact General Neurology

## 2016-07-27 NOTE — Progress Notes (Signed)
Patient ID: Peter Bruce, male   DOB: 03/01/1951, 66 y.o.   MRN: [id number]          [facility] for Infectious Disease  Date of Admission:  07/24/2016   Total days of antibiotics 44        Day 2 ceftriaxone         Active Problems:   CVA (cerebral vascular accident) ([place])   Endocarditis of mitral valve   History of pulmonary embolism   Paroxysmal atrial fibrillation (HCC)   Coronary artery disease involving native coronary artery   Lupus anticoagulant positive   Atrial flutter (HCC)   Current use of long term anticoagulation   Chronic diastolic CHF (congestive heart failure) (HCC)   Essential hypertension   Cancer of base of tongue (HCC)   PMR (polymyalgia rheumatica) (HCC)   Benign essential HTN   Anemia of chronic disease   Thrombocytopenia (HCC)   Bradycardia   Syncope and collapse   Sepsis ([place])   .  stroke: mapping our early stages of recovery book   Does not apply Once  . aspirin  300 mg Rectal Daily   Or  . aspirin  325 mg Oral Daily  . cefTRIAXone (ROCEPHIN)  IV  2 g Intravenous Q24H  . mouth rinse  15 mL Mouth Rinse BID    SUBJECTIVE: Peter Bruce expresses how difficult his recent illnesses and recurrent hospitalizations have been. He tells me he is tired of being poked and parotid. He has not had any nausea, vomiting or diarrhea in several days.  Review of Systems: Review of Systems  Unable to perform ROS: Mental acuity    Past Medical History:  Diagnosis Date  . Acute CHF (congestive heart failure) ([place]) 11/01/2015  . Acute on chronic diastolic heart failure ([place]) 09/27/2015  . Acute pulmonary embolus ([place]) 2010  . Acute respiratory failure with hypoxia ([place]) 09/09/2015  . Arthritis   . Atrial fibrillation [facility])    s/p inpatient DCCV 09/11/2015  . Atrial flutter ([place])   . CHF (congestive heart failure) ([place])   . Coronary artery disease involving native coronary artery 05/15/2015   multivessel  . Dysrhythmia    A-fib  . Essential hypertension   .  Fibromyalgia   . History of kidney stones   . Hypercholesterolemia   . Hypertension   . Lupus anticoagulant positive 07/29/2008  . Mitral regurgitation 10/30/2015  . PMR (polymyalgia rheumatica) (HCC)   . Pneumonia   . 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 S/P Maze operation for atrial fibrillation 05/29/2015   Complete bilateral atrial lesion set via median sternotomy using bipolar radiofrequency and cryothermy ablation with clipping of LA appendage  . 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 Smoking status: Never Smoker  . Smokeless tobacco: Never Used  . Alcohol use No    Family History  Problem Relation Age of Onset  . Cancer Mother   . Other Sister     chf  . Other Sister     Rosalee Kaufman   Allergies  Allergen Reactions  . Amitriptyline Shortness Of Breath  . Serotonin Reuptake Inhibitors ([place]) Nausea And  Vomiting    OBJECTIVE: Vitals:   07/27/16 0900 07/27/16 1000 07/27/16 1100 07/27/16 1130  BP: (!) 121/99 126/85 (!) 118/92   Pulse: 94 98 92   Resp: (!) 25 19 (!) 27   Temp:    98.9 F (37.2 C)  TempSrc:    Oral  SpO2: 93% 97% 98%   Weight:      Height:       Body mass index is 27.96 kg/m.  Physical Exam  Constitutional:  He is sitting up and resting quietly in bed. He is currently being cleaned up by his nurse. He appears to be slightly confused with rambling speech he does not appear to recall visiting with Dr. Roxy Manns this morning.  Cardiovascular: Normal rate and regular rhythm.   No murmur heard. Pulmonary/Chest: Effort normal and breath sounds normal.  Abdominal: Soft. There is no tenderness.  Musculoskeletal: Normal  range of motion. He exhibits no edema or tenderness.  Neurological: He is alert.  Skin: No rash noted.    Lab Results Lab Results  Component Value Date   WBC 18.6 (H) 07/26/2016   HGB 9.8 (L) 07/26/2016   HCT 31.1 (L) 07/26/2016   MCV 95.1 07/26/2016   PLT 110 (L) 07/26/2016    Lab Results  Component Value Date   CREATININE 1.17 07/26/2016   BUN 27 (H) 07/26/2016   NA 138 07/26/2016   K 3.8 07/26/2016   CL 109 07/26/2016   CO2 21 (L) 07/26/2016    Lab Results  Component Value Date   ALT 84 (H) 07/25/2016   AST 44 (H) 07/25/2016   ALKPHOS 52 07/25/2016   BILITOT 1.1 07/25/2016     Microbiology: Recent Results (from the past 240 hour(s))  Blood culture (routine x 2)     Status: None   Collection Time: 07/19/16 11:56 AM  Result Value Ref Range Status   Specimen Description BLOOD LEFT ANTECUBITAL  Final   Special Requests IN PEDIATRIC BOTTLE 3.5CC  Final   Culture   Final    NO GROWTH 5 DAYS Performed at [facility], [place] [address] [place], [place] [address]    Report Status 07/24/2016 FINAL  Final  Urine culture     Status: None   Collection Time: 07/19/16 11:56 AM  Result Value Ref Range Status   Specimen Description URINE, RANDOM  Final   Special Requests NONE  Final   Culture   Final    NO GROWTH Performed at [facility], [place] [address] [place], [place] [address]    Report Status 07/20/2016 FINAL  Final  Blood culture (routine x 2)     Status: None   Collection Time: 07/19/16 12:03 PM  Result Value Ref Range Status   Specimen Description BLOOD LEFT HAND  Final   Special Requests IN PEDIATRIC BOTTLE 3CC  Final   Culture   Final    NO GROWTH 5 DAYS Performed at [facility], [place] [address] [place], [place] [address]    Report Status 07/24/2016 FINAL  Final  C difficile quick scan w PCR reflex     Status: Abnormal   Collection Time: 07/21/16  6:46 AM  Result Value Ref Range Status   C Diff antigen POSITIVE (A) NEGATIVE Final   C  Diff toxin NEGATIVE NEGATIVE Final   C Diff interpretation Results are indeterminate. See PCR results.  Final  Clostridium Difficile by PCR     Status: Abnormal   Collection Time: 07/21/16  6:46 AM  Result Value Ref Range Status   Toxigenic C Difficile by pcr POSITIVE (A) NEGATIVE Final    Comment: Positive for toxigenic C. difficile with little to no toxin production. Only treat if clinical presentation suggests symptomatic illness. Performed at [facility], [place] [address] [place], [place] [address]   Blood Culture (routine x 2)     Status: None (Preliminary result)   Collection Time: 07/24/16  8:12 PM  Result Value Ref Range Status   Specimen Description BLOOD LEFT HAND  Final   Special Requests IN PEDIATRIC BOTTLE 1CC  Final   Culture NO GROWTH 2 DAYS  Final   Report Status PENDING  Incomplete  Blood Culture (routine x 2)     Status: None (Preliminary result)   Collection Time: 07/24/16  8:22 PM  Result Value Ref Range Status   Specimen Description BLOOD RIGHT ANTECUBITAL  Final   Special Requests BOTTLES DRAWN AEROBIC ONLY 10CC  Final   Culture NO GROWTH 2 DAYS  Final   Report Status PENDING  Incomplete  MRSA PCR Screening     Status: None   Collection Time: 07/25/16  3:54 AM  Result Value Ref Range Status   MRSA by PCR NEGATIVE NEGATIVE Final    Comment:        The GeneXpert MRSA Assay (FDA approved for NASAL specimens only), is one component of a comprehensive MRSA colonization surveillance program. It is not intended to diagnose MRSA infection nor to guide or monitor treatment for MRSA infections.   Culture, blood (routine x 2)     Status: None (Preliminary result)   Collection Time: 07/25/16  8:42 AM  Result Value Ref Range Status   Specimen Description BLOOD RIGHT ANTECUBITAL  Final   Special Requests IN PEDIATRIC BOTTLE 2CC  Final   Culture NO GROWTH 1 DAY  Final   Report Status PENDING  Incomplete  Culture, blood (routine x 2)     Status: None  (Preliminary result)   Collection Time: 07/25/16  8:47 AM  Result Value Ref Range Status   Specimen Description BLOOD RIGHT HAND  Final   Special Requests IN PEDIATRIC BOTTLE 2CC  Final   Culture NO GROWTH 1 DAY  Final   Report Status PENDING  Incomplete     ASSESSMENT: Peter Bruce recently completed 6 weeks of IV antibiotic therapy for viridans streptococcal mitral valve endocarditis. He has been afebrile and recent blood cultures (obtained while on antibiotics) have been negative but he was readmitted with an acute stroke. Yesterday's TEE revealed the following:  "There is a very large vegetation involving the entire mitral valve leaflet measuring about 3.7 x 1.5 cm. There is a posteriorly directed very mild mitral regurgitation. Mean gradient across the mitral valve is 10 mmHg, mitral valve area by pressure halftime is 1.9 cm, findings consistent with mild to moderate mitral stenosis."  I have reviewed the situation with Drs. Andres Ege and [place]. I have restarted IV ceftriaxone therapy. He will need follow-up echocardiography in 4-6 weeks to help determine optimal duration of therapy. I will have a new PICC placed.  When he was readmitted last week he had severe nausea, vomiting and diarrhea. He was found to be colonized with a toxin producing strain of C. difficile. I will start him on oral vancomycin and a probiotic in an effort to prevent severe, symptomatic C. difficile colitis which could be life-threatening.  PLAN: 1. Continue ceftriaxone 2. PICC placement 3. Start oral vancomycin and probiotic  Michel Bickers, MD [facility] for Infectious [place] [facility] [phone] pager   [phone] cell 07/27/2016, 1:14 PM

## 2016-07-27 NOTE — Progress Notes (Signed)
Physical Therapy Treatment Patient Details Name: Peter Bruce MRN: [id number] DOB: 09-12-1950 Today's Date: 07/27/2016    History of Present Illness Peter Bruce is a 66 y.o. 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 Recently discharged from hospital for Pna and confusion. Found this admission to have Occlusion of the left posterior inferior cerebellar artery with associated acute infarct of the inferior left cerebellar hemisphere    PT Comments    Pt feels poorly today and is fatigued and nauseated. Pt able to amb short distance in room.   Follow Up Recommendations  Home health PT;Supervision/Assistance - 24 hour     Equipment Recommendations  None recommended by PT    Recommendations for Other Services       Precautions / Restrictions Precautions Precautions: Fall Restrictions Weight Bearing Restrictions: No    Mobility  Bed Mobility Overal bed mobility: Needs Assistance Bed Mobility: Sit to Sidelying         Sit to sidelying: Min assist General bed mobility comments: Assist to bring legs up into bed  Transfers Overall transfer level: Needs assistance Equipment used: Rolling walker (2 wheeled) Transfers: Sit to/from Stand Sit to Stand: Min guard         General transfer comment: Assist for safety  Ambulation/Gait Ambulation/Gait assistance: Min guard Ambulation Distance (Feet): 12 Feet Assistive device: Rolling walker (2 wheeled) Gait Pattern/deviations: Step-through pattern;Decreased step length - right;Decreased step length - left;Wide base of support;Trunk flexed;Shuffle Gait velocity: decr Gait velocity interpretation: Below normal speed for age/gender General Gait Details: Assist for balance. Verbal cues to stand more erect and look up. Distance limited by nausea and pt needing to use  [facility]   Stairs            Wheelchair Mobility    Modified Rankin (Stroke Patients Only) Modified Rankin (Stroke Patients Only) Pre-Morbid Rankin Score: Slight disability Modified Rankin: Moderately severe disability     Balance Overall balance assessment: Needs assistance Sitting-balance support: Feet supported;No upper extremity supported Sitting balance-Leahy Scale: Good     Standing balance support: Bilateral upper extremity supported Standing balance-Leahy Scale: Poor Standing balance comment: walker and supervision for static standing                    Cognition Arousal/Alertness: Awake/alert Behavior During Therapy: Flat affect Overall Cognitive Status: Impaired/Different from baseline Area of Impairment: Problem solving;Memory     Memory: Decreased short-term memory       Problem Solving: Slow processing      Exercises      General Comments        Pertinent Vitals/Pain Pain Assessment: Faces Faces Pain Scale: Hurts little more Pain Location: scrotum Pain Descriptors / Indicators: Grimacing;Tender Pain Intervention(s): Repositioned    Home Living     Available Help at Discharge: Family;Available 24 hours/day Type of Home: House              Prior Function            PT Goals (current goals can now be found in the care plan section) Progress towards PT goals: Not progressing toward goals - comment (nausea and fatigue)    Frequency    Min 3X/week      PT Plan Current plan remains appropriate    Co-evaluation  End of Session Equipment Utilized During Treatment: Gait belt Activity Tolerance: Patient limited by fatigue Patient left: with call bell/phone within reach;in bed;with bed alarm set Nurse Communication: Mobility status;Other (comment) (nausea) PT Visit Diagnosis: Muscle weakness (generalized) (M62.81);Unsteadiness on feet (R26.81)     Time: 5643-3295 PT Time Calculation (min) (ACUTE  ONLY): 22 min  Charges:  $Gait Training: 8-22 mins                    G Codes:       Shary Decamp [place] August 15, 2016, 3:22 PM [employer] PT [phone]

## 2016-07-27 NOTE — Progress Notes (Signed)
Arrived to discuss placement of PICC with patient. States having PICC's previously and willingness to have another. But states wanting to wait until AM to have it placed. Primary RN notified.

## 2016-07-27 NOTE — Progress Notes (Signed)
  Speech Language Pathology Treatment: Dysphagia  Patient Details Name: Peter Bruce MRN: [id number] DOB: 1951-01-15 Today's Date: 07/27/2016 Time: 1120-1150 SLP Time Calculation (min) (ACUTE ONLY): 30 min  Assessment / Plan / Recommendation Clinical Impression  Reinforced precautions, pt had no memory of swallowing strategies. Moderate verbal cues needed for cough and dry swallow though pt is taking very small straw sips independently. Reviewed importance of oral care with wife, who reports husband was very contentious about his oral care pta. Will continue efforts and begin a HEP prior to d/c.   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 Presented with episode of unresponsiveness. HEad CT shows new CVA (Occlusion of the left posterior inferior cerebellar artery with associated acute infarct of the inferior left cerebellar hemisphere, Scattered areas of DWI abnormality in the left temporal lobe, right temporal lobe and right parietal lobe are favored to be areas of subacute ischemia). Recently discharged from [facility] where he was admitted with generalized weakness and found to have NCAP with acute hypoxia nad respiratory failure. Admission CXR: Interval improvement of the previously seen right-sided pleural effusion and right lung base airspace densities with minimal amount of residual pleural effusion and associated right lung base atelectasis/infiltrate. Note that bedside swallow evaluation complete during 06/2016 admission with noted s/s of aspiration, recommendations for dysphagia 3, thin liquids and MBS however at the time patient declined MBS.       SLP Plan  Continue with  current plan of care       Recommendations  Diet recommendations: Dysphagia 3 (mechanical soft);Thin liquid Liquids provided via: Cup;Straw Medication Administration: Whole meds with puree Supervision: Full supervision/cueing for compensatory strategies;Staff to assist with self feeding Compensations: Minimize environmental distractions;Slow rate;Small sips/bites;Hard cough after swallow;Multiple dry swallows after each bite/sip Postural Changes and/or Swallow Maneuvers: Seated upright 90 degrees                Oral Care Recommendations: Oral care before and after PO Follow up Recommendations: Outpatient SLP SLP Visit Diagnosis: Dysphagia, pharyngeal phase (R13.13) Plan: Continue with current plan of care       GO                Emanuella Nickle, Katherene Ponto 07/27/2016, 11:59 AM

## 2016-07-27 NOTE — Evaluation (Signed)
Speech Language Pathology Evaluation Patient Details Name: Peter Bruce MRN: [id number] DOB: 17-Jan-1951 Today's Date: 07/27/2016 Time: 1120-1150 SLP Time Calculation (min) (ACUTE ONLY): 30 min  Problem List:  Patient Active Problem List   Diagnosis Date Noted  . Endocarditis of mitral valve 07/26/2016  . CVA (cerebral vascular accident) ([place]) 07/25/2016  . Bradycardia 07/24/2016  . Syncope and collapse 07/24/2016  . Sepsis ([place]) 07/24/2016  . Lobar pneumonia ([place])   . HCAP (healthcare-associated pneumonia) 07/19/2016  . AKI (acute kidney injury) ([place]) 07/19/2016  . Dehydration 07/19/2016  . Cerebrovascular accident (CVA) due to bilateral embolism of middle cerebral arteries ([place])   . Lupus erythematosus   . PMR (polymyalgia rheumatica) (HCC)   . Coronary artery disease involving coronary bypass graft of native heart without angina pectoris   . Benign essential HTN   . Tachypnea   . Leukocytosis   . Anemia of chronic disease   . Thrombocytopenia ([place])   . Cancer of base of tongue ([place]) 06/14/2016  . Cerebral embolism with cerebral infarction 06/14/2016  . Streptococcal bacteremia   . Prosthetic valve endocarditis ([place])   . Septic embolism ([place])   . Hypercoagulable state ([place])   . Other streptococcal sepsis ([place]) 06/12/2016  . Sepsis, unspecified organism ([place]) 06/11/2016  . Hyponatremia 06/11/2016  . Normocytic anemia 06/11/2016  . S/P minimally invasive mitral valve repair 11/21/2015  . Essential hypertension   . Mitral regurgitation 10/30/2015  . Fatigue 10/14/2015  . Current use of long term anticoagulation 09/27/2015  . Bilateral pleural effusion 09/27/2015  . Chronic diastolic CHF (congestive heart failure) ([place]) 09/27/2015  . Respiratory failure with hypoxia ([place]) 09/09/2015  . Atrial flutter ([place])   . S/P CABG x 3 and maze procedure 05/29/2015  . Coronary artery disease involving native coronary artery 05/15/2015  . Dyslipidemia 04/14/2015  . Polymyalgia  rheumatica--rheumatol [place]-2016 12/19/2013  . Obesity (BMI 30-39.9)   . BPH (benign prostatic hyperplasia) 08/30/2011  . Paroxysmal atrial fibrillation ([place]) 08/30/2011  . Chronic pain 08/30/2011  . Anxiety 04/21/2011  . History of pulmonary embolism 10/02/2008  . Lupus anticoagulant positive 07/29/2008   Past Medical History:  Past Medical History:  Diagnosis Date  . Acute CHF (congestive heart failure) ([place]) 11/01/2015  . Acute on chronic diastolic heart failure ([place]) 09/27/2015  . Acute pulmonary embolus ([place]) 2010  . Acute respiratory failure with hypoxia ([place]) 09/09/2015  . Arthritis   . Atrial fibrillation [facility])    s/p inpatient DCCV 09/11/2015  . Atrial flutter ([place])   . CHF (congestive heart failure) ([place])   . Coronary artery disease involving native coronary artery 05/15/2015   multivessel  . Dysrhythmia    A-fib  . Essential hypertension   . Fibromyalgia   . History of kidney stones   . Hypercholesterolemia   . Hypertension   . Lupus anticoagulant positive 07/29/2008  . Mitral regurgitation 10/30/2015  . PMR (polymyalgia rheumatica) (HCC)   . Pneumonia   . 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 S/P Maze operation for atrial fibrillation 05/29/2015   Complete bilateral atrial lesion set via median sternotomy using bipolar radiofrequency and cryothermy ablation with clipping of LA appendage  . 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 Squamous cell cancer of buccal mucosa (  [place])    Past Surgical History:  Past Surgical History:  Procedure Laterality Date  . APPENDECTOMY    . arm surgery  Right    wrist and elbow  . CARDIAC CATHETERIZATION N/A 05/15/2015   Procedure: Left Heart Cath and Coronary Angiography;  Surgeon: Peter M Martinique,  MD;  Location: [facility] CV LAB;  Service: Cardiovascular;  Laterality: N/A;  . CARDIAC CATHETERIZATION N/A 11/17/2015   Procedure: Right/Left Heart Cath and Coronary/Graft Angiography;  Surgeon: Adrian Prows, MD;  Location: [facility] CV LAB;  Service: Cardiovascular;  Laterality: N/A;  . CARDIOVERSION N/A 09/11/2015   Procedure: CARDIOVERSION;  Surgeon: Lelon Perla, MD;  Location: [facility] ENDOSCOPY;  Service: Cardiovascular;  Laterality: N/A;  . CHEST TUBE INSERTION  11/21/2015   Procedure: LEFT CHEST TUBE INSERTION;  Surgeon: Rexene Alberts, MD;  Location: [facility];  Service: Open Heart Surgery;;  . CLIPPING OF ATRIAL APPENDAGE  05/29/2015   Procedure: CLIPPING OF LEFT ATRIAL APPENDAGE;  Surgeon: Rexene Alberts, MD;  Location: [facility];  Service: Open Heart Surgery;;  45 AtriClip PRO 145  . CORONARY ARTERY BYPASS GRAFT N/A 05/29/2015   Procedure: CORONARY ARTERY BYPASS GRAFTING (CABG), ON PUMP, TIMES THREE, USING LEFT INTERNAL MAMMARY ARTERY, BILATERAL GREATER SAPHENOUS VEINS HARVESTED ENDOSCOPICALLY;  Surgeon: Rexene Alberts, MD;  Location: [facility];  Service: Open Heart Surgery;  Laterality: N/A;  . FRACTURE SURGERY     right wrist  . HERNIA REPAIR    . MAZE N/A 05/29/2015   Procedure: MAZE;  Surgeon: Rexene Alberts, MD;  Location: [facility];  Service: Open Heart Surgery;  Laterality: N/A;  Complete Bi-Atrial Lesion set with Ablation and Cryothermy  . MITRAL VALVE REPAIR Right 11/21/2015   Procedure: MINIMALLY INVASIVE REOPERATION FOR MITRAL VALVE REPAIR;  Surgeon: Rexene Alberts, MD;  Location: [facility];  Service: Open Heart Surgery;  Laterality: Right;  . PERIPHERAL VASCULAR CATHETERIZATION Bilateral 11/17/2015   Procedure: Renal Angiography;  Surgeon: Adrian Prows, MD;  Location: [place] CV LAB;  Service: Cardiovascular;  Laterality: Bilateral;  . PERIPHERAL VASCULAR CATHETERIZATION N/A 11/17/2015   Procedure: Abdominal Aortogram;  Surgeon: Adrian Prows, MD;  Location: [facility] LAB;  Service:  Cardiovascular;  Laterality: N/A;  . SHOULDER SURGERY Left    clavicular fracture  . TEE WITHOUT CARDIOVERSION N/A 05/29/2015   Procedure: TRANSESOPHAGEAL ECHOCARDIOGRAM (TEE);  Surgeon: Rexene Alberts, MD;  Location: [facility];  Service: Open Heart Surgery;  Laterality: N/A;  . TEE WITHOUT CARDIOVERSION N/A 10/30/2015   Procedure: TRANSESOPHAGEAL ECHOCARDIOGRAM (TEE);  Surgeon: Adrian Prows, MD;  Location: [facility];  Service: Cardiovascular;  Laterality: N/A;  . 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 Presented with episode of unresponsiveness. HEad CT shows new CVA (Occlusion of the left posterior inferior cerebellar artery with associated acute infarct of the inferior left cerebellar hemisphere, Scattered areas of DWI abnormality in the left temporal lobe, right temporal lobe and right parietal lobe are favored to be areas of subacute ischemia). Recently discharged from [facility] where he was admitted with generalized  weakness and found to have NCAP with acute hypoxia nad respiratory failure. Admission CXR: Interval improvement of the previously seen right-sided pleural effusion and right lung base airspace densities with minimal amount of residual pleural effusion and associated right lung base atelectasis/infiltrate. Note that bedside swallow evaluation complete during 06/2016 admission with noted s/s of aspiration, recommendations for dysphagia 3, thin liquids and MBS however at the time  patient declined MBS.    Assessment / Plan / Recommendation Clinical Impression  Pt demonstrates cognitive impairment improved since last admission, but still signficant in areas of short term memory and awareness of deficits. Discussed at length with wife. Memory strategies may be beneifical to improve pts awareness of deficits for participation in self care and therapy. Will f/u to implement compensatory strategies.     SLP Assessment  SLP Visit Diagnosis: Dysphagia, pharyngeal phase (R13.13)    Follow Up Recommendations  Outpatient SLP    Frequency and Duration min 1 x/week  2 weeks      SLP Evaluation Cognition  Overall Cognitive Status: Impaired/Different from baseline Arousal/Alertness: Awake/alert Orientation Level: Oriented to person;Oriented to place;Disoriented to time;Disoriented to situation Attention: Focused;Sustained;Selective;Alternating Focused Attention: Appears intact Sustained Attention: Appears intact Selective Attention: Appears intact Alternating Attention: Appears intact Memory: Impaired Memory Impairment: Storage deficit;Retrieval deficit;Decreased short term memory;Decreased recall of new information Decreased Short Term Memory: Verbal complex Awareness: Impaired Awareness Impairment: Intellectual impairment Problem Solving: Impaired Problem Solving Impairment: Verbal complex Executive Function: Reasoning Reasoning: Impaired Reasoning Impairment: Verbal complex Behaviors: Poor frustration tolerance       Comprehension  Auditory Comprehension Overall Auditory Comprehension: Appears within functional limits for tasks assessed Reading Comprehension Reading Status: Within funtional limits    Expression Verbal Expression Overall Verbal Expression: Appears within functional limits for tasks assessed   Oral / Motor  Oral Motor/Sensory Function Overall Oral Motor/Sensory Function: Mild impairment Facial ROM: Within Functional Limits Facial  Symmetry: Within Functional Limits Facial Strength: Within Functional Limits Facial Sensation: Within Functional Limits Lingual ROM: Within Functional Limits Lingual Symmetry: Abnormal symmetry right;Suspected CN XII (hypoglossal) dysfunction Lingual Strength: Within Functional Limits Lingual Sensation: Within Functional Limits Velum: Within Functional Limits Mandible: Within Functional Limits Motor Speech Overall Motor Speech: Appears within functional limits for tasks assessed   GO                   [facility], MA CCC-SLP [phone]  Lynann Beaver 07/27/2016, 12:05 PM

## 2016-07-28 DIAGNOSIS — B9689 Other specified bacterial agents as the cause of diseases classified elsewhere: Secondary | ICD-10-CM

## 2016-07-28 DIAGNOSIS — Z95828 Presence of other vascular implants and grafts: Secondary | ICD-10-CM

## 2016-07-28 DIAGNOSIS — I330 Acute and subacute infective endocarditis: Secondary | ICD-10-CM

## 2016-07-28 LAB — PROTIME-INR
INR: 1.53
PROTHROMBIN TIME: 18.5 s — AB (ref 11.4–15.2)

## 2016-07-28 MED ORDER — METOPROLOL SUCCINATE ER 25 MG PO TB24
25.0000 mg | ORAL_TABLET | Freq: Every day | ORAL | Status: DC
  Administered 2016-07-28 – 2016-07-29 (×2): 25 mg via ORAL
  Filled 2016-07-28 (×2): qty 1

## 2016-07-28 MED ORDER — CHLORHEXIDINE GLUCONATE CLOTH 2 % EX PADS
6.0000 | MEDICATED_PAD | Freq: Every day | CUTANEOUS | Status: DC
  Administered 2016-07-28 – 2016-07-29 (×2): 6 via TOPICAL

## 2016-07-28 MED ORDER — WARFARIN - PHARMACIST DOSING INPATIENT: Freq: Every day | Status: DC

## 2016-07-28 MED ORDER — ROSUVASTATIN CALCIUM 5 MG PO TABS
10.0000 mg | ORAL_TABLET | Freq: Every day | ORAL | Status: DC
  Administered 2016-07-28 – 2016-07-29 (×2): 10 mg via ORAL
  Filled 2016-07-28: qty 2
  Filled 2016-07-28: qty 1

## 2016-07-28 MED ORDER — HYDRALAZINE HCL 20 MG/ML IJ SOLN
10.0000 mg | INTRAMUSCULAR | Status: DC | PRN
  Administered 2016-07-28 – 2016-07-29 (×2): 10 mg via INTRAVENOUS
  Filled 2016-07-28 (×2): qty 1

## 2016-07-28 MED ORDER — SODIUM CHLORIDE 0.9% FLUSH
10.0000 mL | Freq: Two times a day (BID) | INTRAVENOUS | Status: DC
  Administered 2016-07-28 – 2016-07-29 (×3): 10 mL

## 2016-07-28 MED ORDER — SODIUM CHLORIDE 0.9% FLUSH
10.0000 mL | INTRAVENOUS | Status: DC | PRN
  Administered 2016-07-29: 10 mL
  Filled 2016-07-28: qty 40

## 2016-07-28 MED ORDER — WARFARIN SODIUM 10 MG PO TABS
10.0000 mg | ORAL_TABLET | Freq: Once | ORAL | Status: AC
  Administered 2016-07-28: 10 mg via ORAL
  Filled 2016-07-28: qty 1

## 2016-07-28 NOTE — Progress Notes (Signed)
STROKE TEAM PROGRESS NOTE   SUBJECTIVE (INTERVAL HISTORY) His wife is  at bedside.She prefers patient to go for a short stay in rehabilitation prior to coming home as she was having a difficult time caring for him even prior to current stroke Dr. Megan Salon  .discussed case with Dr. Leonie Man.    OBJECTIVE Temp:  [97.9 F (36.6 C)-99 F (37.2 C)] 97.9 F (36.6 C) (03/21 1133) Pulse Rate:  [40-127] 103 (03/21 1400) Cardiac Rhythm: Sinus tachycardia (03/21 1200) Resp:  [15-29] 21 (03/21 1400) BP: (82-153)/(65-125) 82/65 (03/21 1400) SpO2:  [91 %-100 %] 98 % (03/21 1400)  CBC:   Recent Labs Lab 07/24/16 2008  07/25/16 1400 07/26/16 0345  WBC 18.6*  < > 21.1* 18.6*  NEUTROABS 15.7*  --  17.5*  --   HGB 9.6*  < > 10.1* 9.8*  HCT 30.0*  < > 31.7* 31.1*  MCV 95.5  < > 95.2 95.1  PLT 72*  < > 95* 110*  < > = values in this interval not displayed.  Basic Metabolic Panel:   Recent Labs Lab 07/24/16 2008  07/25/16 0832 07/25/16 1400 07/26/16 1245  NA 136  < > 137 134* 138  K 4.2  < > 4.4 4.0 3.8  CL 104  < > 106 105 109  CO2 24  --  20* 21* 21*  GLUCOSE 124*  < > 92 109* 118*  BUN 36*  < > 33* 29* 27*  CREATININE 1.66*  < > 1.38* 1.51* 1.17  CALCIUM 8.0*  --  8.2* 8.2* 8.2*  MG 2.0  --  2.0  --   --   < > = values in this interval not displayed.  Lipid Panel:     Component Value Date/Time   CHOL 170 06/14/2016 0235   TRIG 133 06/14/2016 0235   HDL 24 (L) 06/14/2016 0235   CHOLHDL 7.1 06/14/2016 0235   VLDL 27 06/14/2016 0235   LDLCALC 119 (H) 06/14/2016 0235   HgbA1c:  Lab Results  Component Value Date   HGBA1C 5.7 (H) 06/14/2016   Urine Drug Screen: No results found for: LABOPIA, COCAINSCRNUR, LABBENZ, AMPHETMU, THCU, [place]       TEE  07/26/16: MV: mitral valve ring appears to be intact without paravalvular leak. There is a very large vegetation involving the entire mitral valve leaflet measuring about 3.7 x 1.5 cm. There is a posteriorly directed very mild mitral  regurgitation. Mean gradient across the mitral valve is 10 mmHg, mitral valve area by pressure halftime is 1.9 cm, findings consistent with mild to moderate mitral stenosis.   PHYSICAL EXAM Physical Exam General - Well nourished, well developed, not in distress  Cardiovascular - irregular rate and rhythm; III/VI murmur Pulmonary: CTA Abdomen: NT, ND, normal bowel sounds Extremities: No C/C; positive for edema right>left LE's  Neurological Exam Mental Status: awake alert; follows commands; flat affect Orientation:  Oriented to person, place and year; not month despite several cues patient answered "Peter Bruce" which is his birth month Speech:  Fluent; slight scanning dysarthria  Cranial Nerves:  PERRL; EOMI; visual fields full to threat, face grossly symmetric, hearing grossly intact; shrug symmetric and tongue midline  Motor Exam:  Tone:  Within normal limits; Strength: 5/5 in UEs; patient states bilateral legs feel heavy since he has increased edema and pain in legs.  Exam is 3/5 in LEs bilaterally but effort is poor  Sensory: Intact to light touch throughout  Coordination:  Intact finger to nose  Gait: Deferred  ASSESSMENT/PLAN [name] is a 66 y.o. male with history of previous strokes, atrial fibrillation, endocarditis, minimally invasive mitral valve repair, maze operation for atrial fibrillation, CABG 3, lupus anticoagulant positive, hypertension, hyperlipidemia, coronary artery disease, congestive heart failure, and pulmonary embolus, presenting with slurred speech, difficult ambulation, bradycardia, and hypotension. He did not receive IV t-PA due to anticoagulation and recent strokes.  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 rule out  and mitral valve endocarditis as possible source with TEE today  Resultant  Mild  dysarthria  Discussed with Neurosurgery [facility]); no surgical invention needed on admission  MRI - acute infarct of the inferior left cerebellar hemisphere.  MRA - occlusion of the left posterior inferior cerebellar artery. Suspected severe stenosis of the Lt [place].  CT 3/18 No change. Acute left PICA infarction (L inferior cerebellum).   Repeat CT head in am to check edema  Carotid Doppler - 2/6/12018 - Bilateral - 1% to 39% ICA stenosis lowest end of scale. Vertebral artery flow is antegrade.  2D Echo - not done as had on 06/13/16   TEE  Mitral valve vegetation  LDL - 119 (06/14/2016)  HgbA1c - 5.7 (06/14/2016)  VTE prophylaxis - SCDs DIET DYS 3 Room service appropriate? Yes; Fluid consistency: Thin  Xarelto (rivaroxaban) daily prior to admission, now on aspirin 300 mg suppository daily  Patient counseled to be compliant with his antithrombotic medications  Ongoing aggressive stroke risk factor management  Therapy recommendations: HH OT, PT pending   Transfer to neuro floor bed  Disposition: Pending  Atrial Fibrillation  Home anticoagulation:  Xarelto (rivaroxaban) daily MAZE procedure and LAA closure during CABG 05/2015  resume anticoagulation if endocarditis fully resoved   Native Valve Endocarditis  History of minimally invasive mitral valve repair on 11/21/2015  Off abx as of yesterday  ID saw, do not suspect recurrent endocarditis, nor that endocarditis was source of stroke  Hypertension  Stable  Recommend permissive hypertension as medically tolerated given cardiac disease (OK if < 220/120) but gradually normalize in 5-7 days  Long-term BP goal normotensive  Hyperlipidemia  Home meds: No lipid lowering medications prior to admission  LDL 119, goal < 70  Add statin when PO access is available  Continue statin at discharge  Other Stroke Risk Factors  Advanced age  Overweight, Body mass index is 27.96 kg/m., recommend weight loss, diet and exercise as  appropriate   Hx stroke/TIA  Coronary artery disease - Atherosclerosis of native coronary artery of native heart with angina pectoris S/P CABG x 3 on 05/29/2015 withLIMA to LAD, SVG to OM2, SVG to RCA; and Maze procedure and clipping of left atrial appendage.  Atrial fibrillation  Medical noncompliance  Endocarditis  Lupus anticoagulant positive, Hypercoagulable state  Chronic diastolic CHF  Hx PE 10 years ago  Other Active Problems  bradycardia- no change in meds per cardiology  Syncope and collapse in setting of severe bradycardia, cerebellar stroke  Anemia of chronic disease - 9.9 ; 29  Thrombocytopenia - 110 K ->95  Leukocytosis - 18.6 ( Tmax 99.7)   Kidney disease - 29 ; 1.51  Recent [place] admission - 07/19/2016 - 07/23/2016 - pneumonia  Cancer at base of tongue, follow up with oncology at d/c   Sepsis   PMR on chronic steroids  Hospital day # [place]  for Pager information 07/28/2016 3:04 PM  I have personally examined this patient, reviewed notes, independently viewed imaging studies, participated in medical decision making and plan of care.ROS completed by me personally and pertinent positives fully documented  I have made any additions or clarifications directly to the above note. Agree with note above.    Discussed case again with Dr  Megan Salon and patient started on Ceftriaxone and vancomycin now for 4-6 weeks and got new PICC line today.. Plan to  Discharge to SNF rehab in the next few days if approved Hopefully the mitral valve vegetation responds to.antibiotics. Surgical options for mitral valve replacement are on hold at the moment  . Greater than 50% time during this 35 minute visit was spent on counseling and coordination of care about his embolic stroke, atrial fibrillation, mitral valve endocarditis and discussion with wife and Dr. Megan Salon and answered questions   . Antony Contras, MD Medical Director [facility] Pager: [phone] 07/28/2016 3:04 PM  To contact Stroke Continuity provider, please refer to [email or web address] After hours, contact General Neurology

## 2016-07-28 NOTE — Progress Notes (Signed)
Subjective:  More confused this morning.  Objective:  Vital Signs in the last 24 hours: Temp:  [97.9 F (36.6 C)-99 F (37.2 C)] 98.5 F (36.9 C) (03/21 1543) Pulse Rate:  [40-127] 95 (03/21 1700) Resp:  [13-29] 13 (03/21 1700) BP: (82-151)/(65-125) 105/77 (03/21 1700) SpO2:  [91 %-100 %] 94 % (03/21 1700)  Intake/Output from previous day: 03/20 0701 - 03/21 0700 In: 1095 [P.O.:120; I.V.:975] Out: 1200 [Urine:1200]  Physical Exam:  Blood pressure 105/77, pulse 95, temperature 98.5 F (36.9 C), temperature source Oral, resp. rate 13, height 5\' 10"  (1.778 m), weight 194 lb 14.2 oz (88.4 kg), SpO2 94 %.  General appearance: alert, cooperative, appears stated age and no distress Lungs: faintscattered crackles diffuse, rightbase slightly worse. Chest wall: no tenderness Heart: S1, S2 is normal, 1/6 midsystolic murmur at the apex.  No gallop. Abdomen: soft, non-tender; bowel sounds normal; no masses,  no organomegaly Extremities: Full range of motion and is, 2+ leg edema right worse than the left. Skin: Skin color, texture, turgor normal. No rashes or lesions Neurologic: Grossly normal, flat affect.  Scrotum and penis: Marked scrotal edema present.  Lab Results: BMP  Recent Labs  07/25/16 0832 07/25/16 1400 07/26/16 1245  NA 137 134* 138  K 4.4 4.0 3.8  CL 106 105 109  CO2 20* 21* 21*  GLUCOSE 92 109* 118*  BUN 33* 29* 27*  CREATININE 1.38* 1.51* 1.17  CALCIUM 8.2* 8.2* 8.2*  GFRNONAA 51* 46* >60  GFRAA 60* 53* >60    CBC  Recent Labs Lab 07/25/16 1400 07/26/16 0345  WBC 21.1* 18.6*  RBC 3.33* 3.27*  HGB 10.1* 9.8*  HCT 31.7* 31.1*  PLT 95* 110*  MCV 95.2 95.1  MCH 30.3 30.0  MCHC 31.9 31.5  RDW 17.9* 17.4*  LYMPHSABS 1.3  --   MONOABS 1.7*  --   EOSABS 0.5  --   BASOSABS 0.1  --     HEMOGLOBIN A1C Lab Results  Component Value Date   HGBA1C 5.7 (H) 06/14/2016   MPG 117 06/14/2016    Cardiac Panel (last 3 results)  Recent Labs   07/25/16 0742 07/25/16 1400 07/25/16 1936  TROPONINI 0.11* 0.08* 0.07*    Recent Labs  06/12/16 1118  07/24/16 2008 07/25/16 0832 07/25/16 1400  PROT 6.2*  6.2*  < > 5.2* 5.1* 5.2*  ALBUMIN 2.5*  2.5*  < > 2.4* 2.5* 2.5*  AST 39  38  < > 44* 48* 44*  ALT 23  23  < > 94* 84* 84*  ALKPHOS 47  48  < > 46 47 52  BILITOT 0.7  0.6  < > 1.0 0.9 1.1  BILIDIR 0.2  --   --   --   --   IBILI 0.5  --   --   --   --   < > = values in this interval not displayed.  Imaging: Imaging results have been reviewed  Cardiac Studies:  Telemetry: Reviewed up to today 07/27/16:  NSR, frequent PAC and occasional PVC. No A. Fibrillation.  EKG 07/25/2016: Sinus rhythm with first-degree AV block, low voltage complexes, PVC.  Frequent.  Ventricle couplets noted.  EKG 06/11/2016: Sinus tachycardia at the rate of 98 bpm, first-degree AV block, cannot exclude inferior infarct old.  Incomplete right branch block.  PVC.  EKG 06/11/2016: Normal sinus rhythm/sinus tachycardia at the rate of 100 bpm, normal axis. No evidence of ischemia, normal EKG. PVC.  TEE 03/19/018: ------------------------------------------------------------------- LV EF: 40% -  45%  ------------------------------------------------------------------- Indications:      CVA 22.  ------------------------------------------------------------------- History:   PMH:   Mitral valve disease.  ------------------------------------------------------------------- Study Conclusions  - Left ventricle: Systolic function was mildly to moderately   reduced. The estimated ejection fraction was in the range of 40%   to 45%. The intraventricular septum displays 8 shaped septum both   in systole and diastole and may suggest elevated right [id number].   Septal motion may also be consistent with post thoracotomy state.   Left ventricle is not well visualized due to reverberation   artifact. - Aortic valve: There was mild  regurgitation. - Mitral valve: The mitral valve ring sutures are well preserved.   There is a very large 3.7 x 1.5 cm vegetation noted on the   anterior mitral leaflet, encompassing the entire anterior mitral   leaflet. There is one small second-degree vegetation that is   highly mobile that is very tiny and small on the atrial side.   There is very mild posterior directed mitral regurgitation. There   is mild to moderate mitral stenosis, mean gradient 10 mmHg,   calculated aortic valve area by pressure halftime is 1.9 cm^2.   Valve area by pressure half-time: 1.91 cm^2. - Left atrium: The left atrial appendage is clipped. There is no   residual thrombus on the left atrial appendage. There is no smoke   in the left atrium. The atrium was dilated. - Right ventricle: The cavity size was moderately dilated. Systolic   function was moderately reduced. - Tricuspid valve: No evidence of vegetation. - Pulmonic valve: No evidence of vegetation. - Pulmonary arteries: PA peak pressure: 42 mm Hg (S). - Pericardium, extracardiac: There was a right pleural effusion.  Scheduled Meds: .  stroke: mapping our early stages of recovery book   Does not apply Once  . aspirin  300 mg Rectal Daily   Or  . aspirin  325 mg Oral Daily  . cefTRIAXone (ROCEPHIN)  IV  2 g Intravenous Q24H  . Chlorhexidine Gluconate Cloth  6 each Topical Daily  . mouth rinse  15 mL Mouth Rinse BID  . saccharomyces boulardii  250 mg Oral BID  . sodium chloride flush  10-40 mL Intracatheter Q12H  . vancomycin  125 mg Oral Q6H   Continuous Infusions: . sodium chloride Stopped (07/27/16 1900)   PRN Meds:.acetaminophen **OR** acetaminophen (TYLENOL) oral liquid 160 mg/5 mL **OR** acetaminophen, hydrALAZINE, ondansetron, oxyCODONE-acetaminophen, sodium chloride flush, zolpidem Assessment/Plan:  1. Native valve endocarditis with recurrent stroke.  2. History of minimally invasive mitral valve repair on 11/21/2015 3.  Atherosclerosis of native coronary artery of native heart with angina pectoris S/P CABG x 3 on 05/29/2015 withLIMA to LAD, SVG to OM2, SVG to RCA; and Maze procedure and clipping of left atrial appendage. 4. Paroxysmal atrial fibrillation and atrial flutter, S/P Maze procedure and left atrial appendage closure during CABG, presently on Xarelto. Continue the same. CHA2DS2-VASCScore: Risk Score 4.0 Yearly risk of stroke 4%  5. Hypercoagulable state, primary. On long-term anticoagulation with Coumadin. 6. Hypertension  Recommendation:  I have started IV hydralazine for hypertension with BP of 160/110 mm Hg and at times normal and high at times. Will start Statin (patient previously very reluctant to starting statins and had discontinued this in OP setting) and also low dose beta blocker Toprol XL he was previously on. Anticoagulation with Warfarin (D/W Dr. Leonie Man).   Adrian Prows, M.D. 07/28/2016, [place] PM [facility], [place] Pager: [phone] Office: [phone] If no  answer: [phone]

## 2016-07-28 NOTE — Progress Notes (Signed)
Peripherally Inserted Central Catheter/Midline Placement  The IV Nurse has discussed with the patient and/or persons authorized to consent for the patient, the purpose of this procedure and the potential benefits and risks involved with this procedure.  The benefits include less needle sticks, lab draws from the catheter, and the patient may be discharged home with the catheter. Risks include, but not limited to, infection, bleeding, blood clot (thrombus formation), and puncture of an artery; nerve damage and irregular heartbeat and possibility to perform a PICC exchange if needed/ordered by physician.  Alternatives to this procedure were also discussed.  Bard Power PICC patient education guide, fact sheet on infection prevention and patient information card has been provided to patient /or left at bedside.    PICC/Midline Placement Documentation        Peter Bruce 07/28/2016, 11:53 AM Phone consent obtained from wife Peter Bruce

## 2016-07-28 NOTE — Care Management Important Message (Signed)
Important Message  Patient Details  Name: Peter Bruce MRN: [id number] Date of Birth: 02-10-1951   Medicare Important Message Given:  Yes    Carles Collet, RN 07/28/2016, 10:23 AM
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
## 2016-07-28 NOTE — Progress Notes (Addendum)
Patient ID: Peter Bruce, male   DOB: 06-Jan-1951, 66 y.o.   MRN: [id number]         [facility]  Date of Admission:  07/24/2016   Total days of antibiotics 45        Day 3 ceftriaxone        Day 2 oral vancomycin         Principal Problem:   Endocarditis of mitral valve Active Problems:   CVA (cerebral vascular accident) ([place])   History of pulmonary embolism   Paroxysmal atrial fibrillation ([place])   Coronary artery disease involving native coronary artery   Lupus anticoagulant positive   Atrial flutter (HCC)   Current use of long term anticoagulation   Chronic diastolic CHF (congestive heart failure) (HCC)   Essential hypertension   Cancer of base of tongue (HCC)   PMR (polymyalgia rheumatica) (HCC)   Benign essential HTN   Anemia of chronic disease   Thrombocytopenia (HCC)   Bradycardia   Syncope and collapse   Sepsis ([place])   .  stroke: mapping our early stages of recovery book   Does not apply Once  . aspirin  300 mg Rectal Daily   Or  . aspirin  325 mg Oral Daily  . cefTRIAXone (ROCEPHIN)  IV  2 g Intravenous Q24H  . Chlorhexidine Gluconate Cloth  6 each Topical Daily  . mouth rinse  15 mL Mouth Rinse BID  . saccharomyces boulardii  250 mg Oral BID  . sodium chloride flush  10-40 mL Intracatheter Q12H  . vancomycin  125 mg Oral Q6H   Review of Systems: Review of Systems  Unable to perform ROS: Mental acuity    Past Medical History:  Diagnosis Date  . Acute CHF (congestive heart failure) ([place]) 11/01/2015  . Acute on chronic diastolic heart failure ([place]) 09/27/2015  . Acute pulmonary embolus ([place]) 2010  . Acute respiratory failure with hypoxia ([place]) 09/09/2015  . Arthritis   . Atrial fibrillation [facility])    s/p inpatient DCCV 09/11/2015  . Atrial flutter ([place])   . CHF (congestive heart failure) ([place])   . Coronary artery disease involving native coronary artery 05/15/2015   multivessel  . Dysrhythmia    A-fib  . Essential hypertension   .  Fibromyalgia   . History of kidney stones   . Hypercholesterolemia   . Hypertension   . Lupus anticoagulant positive 07/29/2008  . Mitral regurgitation 10/30/2015  . PMR (polymyalgia rheumatica) (HCC)   . Pneumonia   . 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 S/P Maze operation for atrial fibrillation 05/29/2015   Complete bilateral atrial lesion set via median sternotomy using bipolar radiofrequency and cryothermy ablation with clipping of LA appendage  . 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 Smoking status: Never Smoker  . Smokeless tobacco: Never Used  . Alcohol use No    Family History  Problem Relation Age of Onset  . Cancer Mother   . Other Sister     chf  . Other Sister     Rosalee Kaufman   Allergies  Allergen Reactions  . Amitriptyline Shortness  Of Breath  . Serotonin Reuptake Inhibitors (Ssris) Nausea And Vomiting    OBJECTIVE: Vitals:   07/28/16 1133 07/28/16 1200 07/28/16 1300 07/28/16 1400  BP:  (!) 118/102 115/75 (!) 82/65  Pulse:  100 (!) 101 (!) 103  Resp:  16 19 (!) 21  Temp: 97.9 F (36.6 C)     TempSrc: Oral     SpO2:  98% 100% 98%  Weight:      Height:       Body mass index is 27.96 kg/m.  Physical Exam  Constitutional:  He is alert and in no distress resting quietly in bed. He is confused. His wife, Shauna Hugh, is at the bedside.  Cardiovascular: Normal rate and regular rhythm.   No murmur heard. Pulmonary/Chest: Effort normal and breath sounds normal.  Abdominal: Soft. There is no tenderness.  He is no longer having any diarrhea.  Musculoskeletal: Normal range of motion. He exhibits no edema or tenderness.    Neurological: He is alert.  Skin: No rash noted.  A new right arm PICC is in place.    Lab Results Lab Results  Component Value Date   WBC 18.6 (H) 07/26/2016   HGB 9.8 (L) 07/26/2016   HCT 31.1 (L) 07/26/2016   MCV 95.1 07/26/2016   PLT 110 (L) 07/26/2016    Lab Results  Component Value Date   CREATININE 1.17 07/26/2016   BUN 27 (H) 07/26/2016   NA 138 07/26/2016   K 3.8 07/26/2016   CL 109 07/26/2016   CO2 21 (L) 07/26/2016    Lab Results  Component Value Date   ALT 84 (H) 07/25/2016   AST 44 (H) 07/25/2016   ALKPHOS 52 07/25/2016   BILITOT 1.1 07/25/2016     Microbiology: Recent Results (from the past 240 hour(s))  Blood culture (routine x 2)     Status: None   Collection Time: 07/19/16 11:56 AM  Result Value Ref Range Status   Specimen Description BLOOD LEFT ANTECUBITAL  Final   Special Requests IN PEDIATRIC BOTTLE 3.5CC  Final   Culture   Final    NO GROWTH 5 DAYS Performed at [facility], [place] [address] [place], [place] [address]    Report Status 07/24/2016 FINAL  Final  Urine culture     Status: None   Collection Time: 07/19/16 11:56 AM  Result Value Ref Range Status   Specimen Description URINE, RANDOM  Final   Special Requests NONE  Final   Culture   Final    NO GROWTH Performed at [facility], [place] [address] [place], [place] [address]    Report Status 07/20/2016 FINAL  Final  Blood culture (routine x 2)     Status: None   Collection Time: 07/19/16 12:03 PM  Result Value Ref Range Status   Specimen Description BLOOD LEFT HAND  Final   Special Requests IN PEDIATRIC BOTTLE 3CC  Final   Culture   Final    NO GROWTH 5 DAYS Performed at [facility], [place] [address], [place], [place] [address]    Report Status 07/24/2016 FINAL  Final  C difficile quick scan w PCR reflex     Status: Abnormal   Collection Time: 07/21/16  6:46 AM  Result Value Ref Range Status   C Diff antigen POSITIVE (A) NEGATIVE Final   C Diff toxin  NEGATIVE NEGATIVE Final   C Diff interpretation Results are indeterminate. See PCR results.  Final  Clostridium Difficile by PCR  Status: Abnormal   Collection Time: 07/21/16  6:46 AM  Result Value Ref Range Status   Toxigenic C Difficile by pcr POSITIVE (A) NEGATIVE Final    Comment: Positive for toxigenic C. difficile with little to no toxin production. Only treat if clinical presentation suggests symptomatic illness. Performed at [facility], [place] [address], [place], [place] [address]   Blood Culture (routine x 2)     Status: None (Preliminary result)   Collection Time: 07/24/16  8:12 PM  Result Value Ref Range Status   Specimen Description BLOOD LEFT HAND  Final   Special Requests IN PEDIATRIC BOTTLE 1CC  Final   Culture NO GROWTH 4 DAYS  Final   Report Status PENDING  Incomplete  Blood Culture (routine x 2)     Status: None (Preliminary result)   Collection Time: 07/24/16  8:22 PM  Result Value Ref Range Status   Specimen Description BLOOD RIGHT ANTECUBITAL  Final   Special Requests BOTTLES DRAWN AEROBIC ONLY 10CC  Final   Culture NO GROWTH 4 DAYS  Final   Report Status PENDING  Incomplete  MRSA PCR Screening     Status: None   Collection Time: 07/25/16  3:54 AM  Result Value Ref Range Status   MRSA by PCR NEGATIVE NEGATIVE Final    Comment:        The GeneXpert MRSA Assay (FDA approved for NASAL specimens only), is one component of a comprehensive MRSA colonization surveillance program. It is not intended to diagnose MRSA infection nor to guide or monitor treatment for MRSA infections.   Culture, blood (routine x 2)     Status: None (Preliminary result)   Collection Time: 07/25/16  8:42 AM  Result Value Ref Range Status   Specimen Description BLOOD RIGHT ANTECUBITAL  Final   Special Requests IN PEDIATRIC BOTTLE 2CC  Final   Culture NO GROWTH 3 DAYS  Final   Report Status PENDING  Incomplete  Culture, blood (routine x 2)     Status: None (Preliminary  result)   Collection Time: 07/25/16  8:47 AM  Result Value Ref Range Status   Specimen Description BLOOD RIGHT HAND  Final   Special Requests IN PEDIATRIC BOTTLE 2CC  Final   Culture NO GROWTH 3 DAYS  Final   Report Status PENDING  Incomplete     ASSESSMENT: This is a very difficult situation. 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 His streptococcal isolate was very susceptible to beta-lactam agents. MIC is against aminoglycosides were not checked as far as I can tell. He completed 6 weeks of IV ceftriaxone but was readmitted with an acute stroke and found to have a persistent, large mitral valve vegetation which is the presumed source for an embolic stroke. All recent blood cultures are negative. He was readmitted he had some acute kidney injury that has now resolved. I have restarted IV ceftriaxone but will consider the addition of gentamicin after discussion with my partners.  PLAN: 1. Continue ceftriaxone 2. Consider the addition of gentamicin for 2 weeks 3. Continue oral vancomycin and probiotic  Michel Bickers, MD [facility] [place] [phone] pager   [phone] cell 07/28/2016, 3:09 PM
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
## 2016-07-28 NOTE — Progress Notes (Signed)
ANTICOAGULATION CONSULT NOTE - Initial Consult  Pharmacy Consult for warfarin Indication: atrial fibrillation and stroke (also hx PE/DVT with lupus AC positive; MV repair)  Allergies  Allergen Reactions  . Amitriptyline Shortness Of Breath  . Serotonin Reuptake Inhibitors (Ssris) Nausea And Vomiting    Patient Measurements: Height: 5\' 10"  (177.8 cm) Weight: 194 lb 14.2 oz (88.4 kg) IBW/kg (Calculated) : 73  Vital Signs: Temp: 98.5 F (36.9 C) (03/21 1543) Temp Source: Oral (03/21 1543) BP: 93/81 (03/21 1800) Pulse Rate: 90 (03/21 1800)  Labs:  Recent Labs  07/25/16 1936 07/26/16 0345 07/26/16 1245  HGB  --  9.8*  --   HCT  --  31.1*  --   PLT  --  110*  --   CREATININE  --   --  1.17  TROPONINI 0.07*  --   --     Estimated Creatinine Clearance: 68.6 mL/min (by C-G formula based on SCr of 1.17 mg/dL).   Medical History: Past Medical History:  Diagnosis Date  . Acute CHF (congestive heart failure) ([place]) 11/01/2015  . Acute on chronic diastolic heart failure ([place]) 09/27/2015  . Acute pulmonary embolus ([place]) 2010  . Acute respiratory failure with hypoxia ([place]) 09/09/2015  . Arthritis   . Atrial fibrillation [facility])    s/p inpatient DCCV 09/11/2015  . Atrial flutter ([place])   . CHF (congestive heart failure) ([place])   . Coronary artery disease involving native coronary artery 05/15/2015   multivessel  . Dysrhythmia    A-fib  . Essential hypertension   . Fibromyalgia   . History of kidney stones   . Hypercholesterolemia   . Hypertension   . Lupus anticoagulant positive 07/29/2008  . Mitral regurgitation 10/30/2015  . PMR (polymyalgia rheumatica) ([facility])   . Pneumonia   . 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 S/P Maze operation for atrial fibrillation 05/29/2015   Complete bilateral atrial lesion set via median sternotomy using bipolar radiofrequency and cryothermy ablation with clipping of LA appendage   . 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 male with history of previous strokes, atrial fibrillation, endocarditis, minimally invasive mitral valve repair, maze operation for atrial fibrillation, CABG 3, lupus anticoagulant positive, CAD, and pulmonary embolus, presenting with slurred speech, difficult ambulation, bradycardia, and hypotension. He did not receive IV t-PA due to anticoagulation and recent strokes.  Pharmacy consulted to start warfarin. May consider bridging this patient with extensive, complicated history.  Goal of Therapy:  INR 2-3 Monitor platelets by anticoagulation protocol: Yes   Plan:  Give warfarin 10 mg po x 1 Monitor daily INR, CBC, clinical course, s/sx of bleed, PO intake, DDI F/u need for bridge   Thank you for allowing [place] to participate in this patients care. Jens Som, PharmD Pager: [phone]

## 2016-07-28 NOTE — Progress Notes (Signed)
Pt's diastolic BP has gradually increased throughout the night.  BP has been taken in multiple locations and manually.  All with similar results.  Currently patient's pressure are 130's/100's.  MD contacted.  Will continue to monitor at this time.   Mosie Lukes, RN

## 2016-07-29 ENCOUNTER — Telehealth: Payer: Self-pay | Admitting: *Deleted

## 2016-07-29 DIAGNOSIS — Y712 Prosthetic and other implants, materials and accessory cardiovascular devices associated with adverse incidents: Secondary | ICD-10-CM

## 2016-07-29 DIAGNOSIS — Z820 Family history of epilepsy and other diseases of the nervous system: Secondary | ICD-10-CM | POA: Diagnosis not present

## 2016-07-29 DIAGNOSIS — D72825 Bandemia: Secondary | ICD-10-CM | POA: Diagnosis not present

## 2016-07-29 DIAGNOSIS — I480 Paroxysmal atrial fibrillation: Secondary | ICD-10-CM | POA: Diagnosis not present

## 2016-07-29 DIAGNOSIS — A0472 Enterocolitis due to Clostridium difficile, not specified as recurrent: Secondary | ICD-10-CM | POA: Diagnosis not present

## 2016-07-29 DIAGNOSIS — C069 Malignant neoplasm of mouth, unspecified: Secondary | ICD-10-CM | POA: Diagnosis not present

## 2016-07-29 DIAGNOSIS — A498 Other bacterial infections of unspecified site: Secondary | ICD-10-CM | POA: Diagnosis present

## 2016-07-29 DIAGNOSIS — T826XXD Infection and inflammatory reaction due to cardiac valve prosthesis, subsequent encounter: Secondary | ICD-10-CM

## 2016-07-29 DIAGNOSIS — M6281 Muscle weakness (generalized): Secondary | ICD-10-CM | POA: Diagnosis not present

## 2016-07-29 DIAGNOSIS — Z809 Family history of malignant neoplasm, unspecified: Secondary | ICD-10-CM | POA: Diagnosis not present

## 2016-07-29 DIAGNOSIS — R110 Nausea: Secondary | ICD-10-CM | POA: Diagnosis not present

## 2016-07-29 DIAGNOSIS — I38 Endocarditis, valve unspecified: Secondary | ICD-10-CM | POA: Diagnosis not present

## 2016-07-29 DIAGNOSIS — R791 Abnormal coagulation profile: Secondary | ICD-10-CM | POA: Diagnosis not present

## 2016-07-29 DIAGNOSIS — Z8249 Family history of ischemic heart disease and other diseases of the circulatory system: Secondary | ICD-10-CM | POA: Diagnosis not present

## 2016-07-29 DIAGNOSIS — R2681 Unsteadiness on feet: Secondary | ICD-10-CM | POA: Diagnosis not present

## 2016-07-29 DIAGNOSIS — I639 Cerebral infarction, unspecified: Secondary | ICD-10-CM | POA: Diagnosis not present

## 2016-07-29 DIAGNOSIS — E785 Hyperlipidemia, unspecified: Secondary | ICD-10-CM | POA: Diagnosis not present

## 2016-07-29 DIAGNOSIS — G464 Cerebellar stroke syndrome: Secondary | ICD-10-CM | POA: Diagnosis not present

## 2016-07-29 DIAGNOSIS — D638 Anemia in other chronic diseases classified elsewhere: Secondary | ICD-10-CM | POA: Diagnosis not present

## 2016-07-29 DIAGNOSIS — R1312 Dysphagia, oropharyngeal phase: Secondary | ICD-10-CM | POA: Diagnosis not present

## 2016-07-29 DIAGNOSIS — B9689 Other specified bacterial agents as the cause of diseases classified elsewhere: Secondary | ICD-10-CM | POA: Diagnosis not present

## 2016-07-29 DIAGNOSIS — D6949 Other primary thrombocytopenia: Secondary | ICD-10-CM | POA: Diagnosis not present

## 2016-07-29 DIAGNOSIS — I110 Hypertensive heart disease with heart failure: Secondary | ICD-10-CM | POA: Diagnosis not present

## 2016-07-29 DIAGNOSIS — Z95828 Presence of other vascular implants and grafts: Secondary | ICD-10-CM | POA: Diagnosis not present

## 2016-07-29 DIAGNOSIS — B954 Other streptococcus as the cause of diseases classified elsewhere: Secondary | ICD-10-CM

## 2016-07-29 DIAGNOSIS — I5032 Chronic diastolic (congestive) heart failure: Secondary | ICD-10-CM | POA: Diagnosis not present

## 2016-07-29 DIAGNOSIS — Z452 Encounter for adjustment and management of vascular access device: Secondary | ICD-10-CM | POA: Diagnosis not present

## 2016-07-29 DIAGNOSIS — I330 Acute and subacute infective endocarditis: Secondary | ICD-10-CM | POA: Diagnosis not present

## 2016-07-29 DIAGNOSIS — R221 Localized swelling, mass and lump, neck: Secondary | ICD-10-CM | POA: Diagnosis not present

## 2016-07-29 DIAGNOSIS — I6340 Cerebral infarction due to embolism of unspecified cerebral artery: Secondary | ICD-10-CM | POA: Diagnosis not present

## 2016-07-29 DIAGNOSIS — K219 Gastro-esophageal reflux disease without esophagitis: Secondary | ICD-10-CM | POA: Diagnosis not present

## 2016-07-29 DIAGNOSIS — Z888 Allergy status to other drugs, medicaments and biological substances status: Secondary | ICD-10-CM | POA: Diagnosis not present

## 2016-07-29 DIAGNOSIS — I25810 Atherosclerosis of coronary artery bypass graft(s) without angina pectoris: Secondary | ICD-10-CM | POA: Diagnosis not present

## 2016-07-29 DIAGNOSIS — R531 Weakness: Secondary | ICD-10-CM | POA: Diagnosis not present

## 2016-07-29 DIAGNOSIS — I69398 Other sequelae of cerebral infarction: Secondary | ICD-10-CM | POA: Diagnosis not present

## 2016-07-29 DIAGNOSIS — G894 Chronic pain syndrome: Secondary | ICD-10-CM | POA: Diagnosis not present

## 2016-07-29 DIAGNOSIS — Z5189 Encounter for other specified aftercare: Secondary | ICD-10-CM | POA: Diagnosis not present

## 2016-07-29 DIAGNOSIS — D696 Thrombocytopenia, unspecified: Secondary | ICD-10-CM | POA: Diagnosis not present

## 2016-07-29 DIAGNOSIS — E440 Moderate protein-calorie malnutrition: Secondary | ICD-10-CM | POA: Diagnosis not present

## 2016-07-29 DIAGNOSIS — R278 Other lack of coordination: Secondary | ICD-10-CM | POA: Diagnosis not present

## 2016-07-29 DIAGNOSIS — I058 Other rheumatic mitral valve diseases: Secondary | ICD-10-CM | POA: Diagnosis not present

## 2016-07-29 LAB — CULTURE, BLOOD (ROUTINE X 2)
CULTURE: NO GROWTH
Culture: NO GROWTH

## 2016-07-29 LAB — PROTIME-INR
INR: 1.48
Prothrombin Time: 18.1 seconds — ABNORMAL HIGH (ref 11.4–15.2)

## 2016-07-29 MED ORDER — DEXTROSE 5 % IV SOLN: 2.0000 g | INTRAVENOUS | Status: AC

## 2016-07-29 MED ORDER — VANCOMYCIN 50 MG/ML ORAL SOLUTION: 125.0000 mg | Freq: Four times a day (QID) | ORAL | Status: DC

## 2016-07-29 MED ORDER — FUROSEMIDE 10 MG/ML IJ SOLN
40.0000 mg | Freq: Once | INTRAMUSCULAR | Status: AC
  Administered 2016-07-29: 40 mg via INTRAVENOUS
  Filled 2016-07-29: qty 4

## 2016-07-29 MED ORDER — SODIUM CHLORIDE 0.9% FLUSH: 10.0000 mL | INTRAVENOUS | Status: AC | PRN

## 2016-07-29 MED ORDER — POTASSIUM CHLORIDE CRYS ER 20 MEQ PO TBCR: 20.0000 meq | EXTENDED_RELEASE_TABLET | Freq: Every day | ORAL | Status: AC

## 2016-07-29 MED ORDER — FUROSEMIDE 40 MG PO TABS: 40.0000 mg | ORAL_TABLET | Freq: Every day | ORAL | Status: DC

## 2016-07-29 MED ORDER — FUROSEMIDE 40 MG PO TABS: 40.0000 mg | ORAL_TABLET | Freq: Every day | ORAL | Status: AC

## 2016-07-29 MED ORDER — METOPROLOL SUCCINATE ER 25 MG PO TB24: 25.0000 mg | ORAL_TABLET | Freq: Every day | ORAL | Status: AC

## 2016-07-29 MED ORDER — OXYCODONE-ACETAMINOPHEN 5-325 MG PO TABS: 1.0000 | ORAL_TABLET | Freq: Three times a day (TID) | ORAL | 0 refills | Status: AC | PRN

## 2016-07-29 MED ORDER — GENTAMICIN IN SALINE 1-0.9 MG/ML-% IV SOLN
100.0000 mg | Freq: Once | INTRAVENOUS | Status: AC
  Administered 2016-07-29: 100 mg via INTRAVENOUS
  Filled 2016-07-29: qty 100

## 2016-07-29 MED ORDER — GENTAMICIN IN SALINE 1.6-0.9 MG/ML-% IV SOLN: 80.0000 mg | INTRAVENOUS | Status: DC

## 2016-07-29 MED ORDER — ZOLPIDEM TARTRATE 10 MG PO TABS: 10.0000 mg | ORAL_TABLET | Freq: Every evening | ORAL | 0 refills | Status: DC | PRN

## 2016-07-29 MED ORDER — WARFARIN SODIUM 7.5 MG PO TABS
7.5000 mg | ORAL_TABLET | Freq: Once | ORAL | Status: AC
  Administered 2016-07-29: 7.5 mg via ORAL
  Filled 2016-07-29: qty 1

## 2016-07-29 MED ORDER — WARFARIN SODIUM 5 MG PO TABS: 7.5000 mg | ORAL_TABLET | Freq: Once | ORAL | 2 refills | Status: DC

## 2016-07-29 MED ORDER — POTASSIUM CHLORIDE CRYS ER 20 MEQ PO TBCR
20.0000 meq | EXTENDED_RELEASE_TABLET | Freq: Every day | ORAL | Status: DC
  Administered 2016-07-29: 20 meq via ORAL
  Filled 2016-07-29: qty 1

## 2016-07-29 MED ORDER — ASPIRIN 325 MG PO TABS: 325.0000 mg | ORAL_TABLET | Freq: Every day | ORAL | Status: DC

## 2016-07-29 MED ORDER — ROSUVASTATIN CALCIUM 10 MG PO TABS: 10.0000 mg | ORAL_TABLET | Freq: Every day | ORAL | Status: AC

## 2016-07-29 MED ORDER — SACCHAROMYCES BOULARDII 250 MG PO CAPS: 250.0000 mg | ORAL_CAPSULE | Freq: Two times a day (BID) | ORAL | Status: AC

## 2016-07-29 NOTE — Progress Notes (Signed)
Subjective:  NO specific complaints.BP much improved on Metoprolol and IV hydralazine PRN.  Objective:  Vital Signs in the last 24 hours: Temp:  [97.9 F (36.6 C)-99.6 F (37.6 C)] (P) 98.2 F (36.8 C) (03/22 1736) Pulse Rate:  [42-101] (P) 85 (03/22 1736) Resp:  [13-29] (P) 20 (03/22 1736) BP: (90-127)/(55-112) (P) 103/75 (03/22 1736) SpO2:  [92 %-100 %] (P) 96 % (03/22 1736)  Intake/Output from previous day: 03/21 0701 - 03/22 0700 In: 710 [P.O.:600; I.V.:10; IV Piggyback:100] Out: 700 [Urine:700]  Physical Exam:  Blood pressure 105/77, pulse 95, temperature 98.5 F (36.9 C), temperature source Oral, resp. rate 13, height 5\' 10"  (1.778 m), weight 194 lb 14.2 oz (88.4 kg), SpO2 94 %.  General appearance: alert, cooperative, appears stated age and no distress Lungs: faintscattered crackles diffuse, rightbase slightly worse. Chest wall: no tenderness Heart: S1, S2 is normal, 1/6 midsystolic murmur at the apex.  No gallop. Abdomen: soft, non-tender; bowel sounds normal; no masses,  no organomegaly Extremities: Full range of motion and is, 2+ leg edema right worse than the left. Skin: Skin color, texture, turgor normal. No rashes or lesions Neurologic: Grossly normal, flat affect.  Scrotum and penis: Marked scrotal edema present. Worse than yesterday.  Lab Results: BMP  Recent Labs  07/25/16 0832 07/25/16 1400 07/26/16 1245  NA 137 134* 138  K 4.4 4.0 3.8  CL 106 105 109  CO2 20* 21* 21*  GLUCOSE 92 109* 118*  BUN 33* 29* 27*  CREATININE 1.38* 1.51* 1.17  CALCIUM 8.2* 8.2* 8.2*  GFRNONAA 51* 46* >60  GFRAA 60* 53* >60    CBC  Recent Labs Lab 07/25/16 1400 07/26/16 0345  WBC 21.1* 18.6*  RBC 3.33* 3.27*  HGB 10.1* 9.8*  HCT 31.7* 31.1*  PLT 95* 110*  MCV 95.2 95.1  MCH 30.3 30.0  MCHC 31.9 31.5  RDW 17.9* 17.4*  LYMPHSABS 1.3  --   MONOABS 1.7*  --   EOSABS 0.5  --   BASOSABS 0.1  --     HEMOGLOBIN A1C Lab Results  Component Value Date   HGBA1C  5.7 (H) 06/14/2016   MPG 117 06/14/2016    Cardiac Panel (last 3 results)  Recent Labs  07/25/16 0742 07/25/16 1400 07/25/16 1936  TROPONINI 0.11* 0.08* 0.07*    Recent Labs  06/12/16 1118  07/24/16 2008 07/25/16 0832 07/25/16 1400  PROT 6.2*  6.2*  < > 5.2* 5.1* 5.2*  ALBUMIN 2.5*  2.5*  < > 2.4* 2.5* 2.5*  AST 39  38  < > 44* 48* 44*  ALT 23  23  < > 94* 84* 84*  ALKPHOS 47  48  < > 46 47 52  BILITOT 0.7  0.6  < > 1.0 0.9 1.1  BILIDIR 0.2  --   --   --   --   IBILI 0.5  --   --   --   --   < > = values in this interval not displayed.  Imaging: Imaging results have been reviewed  Cardiac Studies:  Telemetry: Reviewed up to today 07/27/16:  NSR, frequent PAC and occasional PVC. No A. Fibrillation.  EKG 07/25/2016: Sinus rhythm with first-degree AV block, low voltage complexes, PVC.  Frequent.  Ventricle couplets noted.  EKG 06/11/2016: Sinus tachycardia at the rate of 98 bpm, first-degree AV block, cannot exclude inferior infarct old.  Incomplete right branch block.  PVC.  EKG 06/11/2016: Normal sinus rhythm/sinus tachycardia at the rate of 100 bpm,  normal axis. No evidence of ischemia, normal EKG. PVC.  TEE 03/19/018: ------------------------------------------------------------------- LV EF: 40% -   45%  ------------------------------------------------------------------- Indications:      CVA 436.  ------------------------------------------------------------------- History:   PMH:   Mitral valve disease.  ------------------------------------------------------------------- Study Conclusions  - Left ventricle: Systolic function was mildly to moderately   reduced. The estimated ejection fraction was in the range of 40%   to 45%. The intraventricular septum displays 8 shaped septum both   in systole and diastole and may suggest elevated right [id number].   Septal motion may also be consistent with post thoracotomy state.   Left ventricle is not  well visualized due to reverberation   artifact. - Aortic valve: There was mild regurgitation. - Mitral valve: The mitral valve ring sutures are well preserved.   There is a very large 3.7 x 1.5 cm vegetation noted on the   anterior mitral leaflet, encompassing the entire anterior mitral   leaflet. There is one small second-degree vegetation that is   highly mobile that is very tiny and small on the atrial side.   There is very mild posterior directed mitral regurgitation. There   is mild to moderate mitral stenosis, mean gradient 10 mmHg,   calculated aortic valve area by pressure halftime is 1.9 cm^2.   Valve area by pressure half-time: 1.91 cm^2. - Left atrium: The left atrial appendage is clipped. There is no   residual thrombus on the left atrial appendage. There is no smoke   in the left atrium. The atrium was dilated. - Right ventricle: The cavity size was moderately dilated. Systolic   function was moderately reduced. - Tricuspid valve: No evidence of vegetation. - Pulmonic valve: No evidence of vegetation. - Pulmonary arteries: PA peak pressure: 42 mm Hg (S). - Pericardium, extracardiac: There was a right pleural effusion.  Scheduled Meds: .  stroke: mapping our early stages of recovery book   Does not apply Once  . aspirin  300 mg Rectal Daily   Or  . aspirin  325 mg Oral Daily  . cefTRIAXone (ROCEPHIN)  IV  2 g Intravenous Q24H  . Chlorhexidine Gluconate Cloth  6 each Topical Daily  . [START ON 07/30/2016] furosemide  40 mg Oral Daily  . [START ON 07/30/2016] gentamicin  80 mg Intravenous Q24H  . mouth rinse  15 mL Mouth Rinse BID  . metoprolol succinate  25 mg Oral Daily  . potassium chloride  20 mEq Oral Daily  . rosuvastatin  10 mg Oral q1800  . saccharomyces boulardii  250 mg Oral BID  . sodium chloride flush  10-40 mL Intracatheter Q12H  . vancomycin  125 mg Oral Q6H  . warfarin  7.5 mg Oral ONCE-1800  . Warfarin - Pharmacist Dosing Inpatient   Does not apply  q1800   Continuous Infusions: . sodium chloride Stopped (07/27/16 1900)   PRN Meds:.acetaminophen **OR** acetaminophen (TYLENOL) oral liquid 160 mg/5 mL **OR** acetaminophen, hydrALAZINE, ondansetron, oxyCODONE-acetaminophen, sodium chloride flush, zolpidem Assessment/Plan:  1. Native valve endocarditis with recurrent stroke.  2. History of minimally invasive mitral valve repair on 11/21/2015 3. Atherosclerosis of native coronary artery of native heart with angina pectoris S/P CABG x 3 on 05/29/2015 withLIMA to LAD, SVG to OM2, SVG to RCA; and Maze procedure and clipping of left atrial appendage. 4. Paroxysmal atrial fibrillation and atrial flutter, S/P Maze procedure and left atrial appendage closure during CABG, presently on Xarelto. Continue the same. CHA2DS2-VASCScore: Risk Score 4.0 Yearly risk of stroke 4%  5. Hypercoagulable state, primary. On long-term anticoagulation with Coumadin. 6. Hypertension  Recommendation:  Lasix for scrotal edema and fluid gain and anasarca, I have been in touch with his wife regarding discharge planning, from cardiac standpoint no clinical e/o acute decompensated CHF. Has right pleural effusion and suspect anasarca due to hypoproteinemia and sepsis. I will standby for now.  Adrian Prows, M.D. 07/29/2016, 5:55 PM [facility] Pager: [phone] Office: [phone] If no answer: [phone]

## 2016-07-29 NOTE — Progress Notes (Signed)
Report given to [facility] from [facility]. Patient is waiting for transport. Family aware of disposition. PICC lock.   Ave Filter, RN

## 2016-07-29 NOTE — Progress Notes (Signed)
Patient will DC to: [facility] Anticipated DC date: 07/29/16 Family notified: Spouse Transport by: Corey Harold   Per MD patient ready for DC to [facility]. RN, patient, patient's family, and facility notified of DC. Discharge Summary sent to facility. RN given number for report. DC packet on chart. Ambulance transport requested for patient.   CSW signing off.  Cedric Fishman, [place] Social Worker [phone]

## 2016-07-29 NOTE — Clinical Social Work Note (Signed)
Clinical Social Work Assessment  Patient Details  Name: Peter Bruce MRN: [id number] Date of Birth: [birth year]  Date of referral:  07/29/16               Reason for consult:  Facility Placement                Permission sought to share information with:  Facility Sport and exercise psychologist, Family Supports Permission granted to share information::  Yes, Verbal Permission Granted  Name::     Stage manager::  SNFs  Relationship::  Spouse  Contact Information:     Housing/Transportation Living arrangements for the past 2 months:  Single Family Home Source of Information:  Spouse Patient Interpreter Needed:  None Criminal Activity/Legal Involvement Pertinent to Current Situation/Hospitalization:  No - Comment as needed Significant Relationships:  Spouse Lives with:  Spouse Do you feel safe going back to the place where you live?  No Need for family participation in patient care:  Yes (Comment)  Care giving concerns:  CSW received consult for possible SNF placement at time of discharge. Patient is disoriented. CSW spoke with patient's spouse regarding PT recommendation of SNF placement at time of discharge. Patient's spouse reported that patient's spouse is currently unable to care for patient at their home given patient's current physical needs and fall risk. Patient expressed understanding of PT recommendation and is agreeable to SNF placement at time of discharge. CSW to continue to follow and assist with discharge planning needs.   Social Worker assessment / plan:  CSW spoke with patient's spouse concerning possibility of rehab at [facility] before returning home.  Employment status:  Retired Forensic scientist:  [employer] PT Recommendations:  [place], [place] / Referral to community resources:  [place]  Patient/Family's Response to care:  Patient recognizes need for rehab before returning home and is agreeable to a SNF in [place]. Patient's spouse reported preference for [facility].  Patient/Family's Understanding of and Emotional Response to Diagnosis, Current Treatment, and Prognosis:  Patient/family is realistic regarding therapy needs and expressed being hopeful for SNF placement. Patient's spouse stated that she was exhausted. Patient's spouse expressed understanding of CSW role and discharge process. No questions/concerns about plan or treatment.    Emotional Assessment Appearance:  Appears stated age Attitude/Demeanor/Rapport:  Unable to Assess Affect (typically observed):  Unable to Assess Orientation:  Oriented to Self, Oriented to Situation Alcohol / Substance use:  Not Applicable Psych involvement (Current and /or in the community):  No (Comment)  Discharge Needs  Concerns to be addressed:  Care Coordination Readmission within the last 30 days:  Yes Current discharge risk:  None Barriers to Discharge:  No Barriers Identified   Benard Halsted, [place] 07/29/2016, 3:57 PM

## 2016-07-29 NOTE — Progress Notes (Signed)
Attempted to call Isaias Cowman at [phone] for report but no one is answering phone. RN will call back for back for report.   Ave Filter, RN

## 2016-07-29 NOTE — Progress Notes (Signed)
Last rocephin administration 07/28/16 at 1731 verbal report given to [facility] place Therapist, sports. Patient administered 7.5 mg coumadin and 20 mg crestor PO prior to d/c. Patient received 1 tab percocet at 1513, RN unable to administer prior to d/c due to q 8 hr frequency. Patient Rx sent with d/c papers. Patient transferred from bed to stretcher with no difficulties. All belongings checked, confirmed by patient and sent with PTAR. Patient wife notified of patient transfer.

## 2016-07-29 NOTE — Progress Notes (Addendum)
Patient ID: Peter Bruce, male   DOB: May 16, 1950, 66 y.o.   MRN: [id number]         [facility]  Date of Admission:  07/24/2016   Total days of antibiotics 46        Day 4 ceftriaxone        Day 3 oral vancomycin         Principal Problem:   Endocarditis of mitral valve Active Problems:   CVA (cerebral vascular accident) ([place])   History of pulmonary embolism   Paroxysmal atrial fibrillation ([place])   Coronary artery disease involving native coronary artery   Lupus anticoagulant positive   Atrial flutter (HCC)   Current use of long term anticoagulation   Chronic diastolic CHF (congestive heart failure) (HCC)   Essential hypertension   Cancer of base of tongue (HCC)   PMR (polymyalgia rheumatica) (HCC)   Benign essential HTN   Anemia of chronic disease   Thrombocytopenia (HCC)   Bradycardia   Syncope and collapse   Sepsis ([place])   .  stroke: mapping our early stages of recovery book   Does not apply Once  . aspirin  300 mg Rectal Daily   Or  . aspirin  325 mg Oral Daily  . cefTRIAXone (ROCEPHIN)  IV  2 g Intravenous Q24H  . Chlorhexidine Gluconate Cloth  6 each Topical Daily  . [START ON 07/30/2016] furosemide  40 mg Oral Daily  . gentamicin  100 mg Intravenous Once  . [START ON 07/30/2016] gentamicin  80 mg Intravenous Q24H  . mouth rinse  15 mL Mouth Rinse BID  . metoprolol succinate  25 mg Oral Daily  . potassium chloride  20 mEq Oral Daily  . rosuvastatin  10 mg Oral q1800  . saccharomyces boulardii  250 mg Oral BID  . sodium chloride flush  10-40 mL Intracatheter Q12H  . vancomycin  125 mg Oral Q6H  . warfarin  7.5 mg Oral ONCE-1800  . Warfarin - Pharmacist Dosing Inpatient   Does not apply q1800   Review of Systems: Review of Systems  Unable to perform ROS: Mental acuity    Past Medical History:  Diagnosis Date  . Acute CHF (congestive heart failure) ([place]) 11/01/2015  . Acute on chronic diastolic heart failure ([place]) 09/27/2015  . Acute  pulmonary embolus ([place]) 2010  . Acute respiratory failure with hypoxia ([place]) 09/09/2015  . Arthritis   . Atrial fibrillation [facility])    s/p inpatient DCCV 09/11/2015  . Atrial flutter ([place])   . CHF (congestive heart failure) ([place])   . Coronary artery disease involving native coronary artery 05/15/2015   multivessel  . Dysrhythmia    A-fib  . Essential hypertension   . Fibromyalgia   . History of kidney stones   . Hypercholesterolemia   . Hypertension   . Lupus anticoagulant positive 07/29/2008  . Mitral regurgitation 10/30/2015  . PMR (polymyalgia rheumatica) (HCC)   . Pneumonia   . 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 S/P Maze operation for atrial fibrillation 05/29/2015   Complete bilateral atrial lesion set via median sternotomy using bipolar radiofrequency and cryothermy ablation with clipping of LA appendage  . 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 Smoking status: Never Smoker  . Smokeless tobacco: Never Used  . Alcohol use No    Family History  Problem Relation Age of Onset  . Cancer Mother   . Other Sister     chf  . Other Sister     Rosalee Kaufman   Allergies  Allergen Reactions  . Amitriptyline Shortness Of Breath  . Serotonin Reuptake Inhibitors (Ssris) Nausea And Vomiting    OBJECTIVE: Vitals:   07/29/16 1100 07/29/16 1103 07/29/16 1130 07/29/16 1247  BP: 104/76 104/76 107/78 96/76  Pulse: 96 97 88 86  Resp: (!) 21  20 20   Temp:   98.6 F (37 C) 97.9 F (36.6 C)  TempSrc:   Axillary Oral  SpO2: 94% 94% 96% 96%  Weight:      Height:       Body mass index is 27.96 kg/m.  Physical Exam  Constitutional:  He is a little bit  more alert and looks stronger today. He is sitting up in a chair visiting with his wife but he remains confused.  Cardiovascular: Normal rate and regular rhythm.   No murmur heard. Pulmonary/Chest: Effort normal and breath sounds normal.  Abdominal: Soft. There is no tenderness.  He is no longer having any diarrhea.  Musculoskeletal: Normal range of motion. He exhibits no edema or tenderness.  Neurological: He is alert.  Skin: No rash noted.  A new right arm PICC is in place.    Lab Results Lab Results  Component Value Date   WBC 18.6 (H) 07/26/2016   HGB 9.8 (L) 07/26/2016   HCT 31.1 (L) 07/26/2016   MCV 95.1 07/26/2016   PLT 110 (L) 07/26/2016    Lab Results  Component Value Date   CREATININE 1.17 07/26/2016   BUN 27 (H) 07/26/2016   NA 138 07/26/2016   K 3.8 07/26/2016   CL 109 07/26/2016   CO2 21 (L) 07/26/2016    Lab Results  Component Value Date   ALT 84 (H) 07/25/2016   AST 44 (H) 07/25/2016   ALKPHOS 52 07/25/2016   BILITOT 1.1 07/25/2016     Microbiology: Recent Results (from the past 240 hour(s))  C difficile quick scan w PCR reflex     Status: Abnormal   Collection Time: 07/21/16  6:46 AM  Result Value Ref Range Status   C Diff antigen POSITIVE (A) NEGATIVE Final   C Diff toxin NEGATIVE NEGATIVE Final   C Diff interpretation Results are indeterminate. See PCR results.  Final  Clostridium Difficile by PCR     Status: Abnormal   Collection Time: 07/21/16  6:46 AM  Result Value Ref Range Status   Toxigenic C Difficile by pcr POSITIVE (A) NEGATIVE Final    Comment: Positive for toxigenic C. difficile with little to no toxin production. Only treat if clinical presentation suggests symptomatic illness. Performed at [facility], [place] [address], [place], [place] [address]   Blood Culture (routine x 2)     Status: None   Collection Time: 07/24/16  8:12 PM  Result Value Ref Range Status   Specimen Description BLOOD LEFT HAND  Final   Special Requests  IN PEDIATRIC BOTTLE 1CC  Final   Culture NO GROWTH 5 DAYS  Final   Report Status 07/29/2016 FINAL  Final  Blood Culture (routine x 2)     Status: None   Collection Time: 07/24/16  8:22 PM  Result Value Ref Range Status   Specimen Description BLOOD RIGHT ANTECUBITAL  Final   Special Requests BOTTLES DRAWN AEROBIC ONLY 10CC  Final   Culture NO GROWTH 5 DAYS  Final   Report Status 07/29/2016 FINAL  Final  MRSA PCR Screening     Status: None   Collection Time: 07/25/16  3:54 AM  Result Value Ref Range Status   MRSA by PCR NEGATIVE NEGATIVE Final    Comment:        The GeneXpert MRSA Assay (FDA approved for NASAL specimens only), is one component of a comprehensive MRSA colonization surveillance program. It is not intended to diagnose MRSA infection nor to guide or monitor treatment for MRSA infections.   Culture, blood (routine x 2)     Status: None (Preliminary result)   Collection Time: 07/25/16  8:42 AM  Result Value Ref Range Status   Specimen Description BLOOD RIGHT ANTECUBITAL  Final   Special Requests IN PEDIATRIC BOTTLE 2CC  Final   Culture NO GROWTH 4 DAYS  Final   Report Status PENDING  Incomplete  Culture, blood (routine x 2)     Status: None (Preliminary result)   Collection Time: 07/25/16  8:47 AM  Result Value Ref Range Status   Specimen Description BLOOD RIGHT HAND  Final   Special Requests IN PEDIATRIC BOTTLE 2CC  Final   Culture NO GROWTH 4 DAYS  Final   Report Status PENDING  Incomplete     ASSESSMENT: He has a very large, persistent mitral valve vegetation following 6 weeks of IV ceftriaxone therapy to treat viridans streptococcal prosthetic valve endocarditis. We believe that his recent acute stroke was due to embolization from the vegetation. Although he does not have clear evidence of persistent infection (he is afebrile and recent blood cultures obtained on antibiotics are negative) I still have some concern about undertreated infection. I reviewed  treatment options with my partners and have elected to start once daily, low-dose gentamicin. His renal function has returned to normal but his risk for nephrotoxicity is relatively high. His renal function and gentamicin levels will need to be monitored very closely here and after discharge to a skilled nursing facility.  PLAN: 1. Continue ceftriaxone 2. Add gentamicin for 2 weeks 3. Continue oral vancomycin and probiotic 4. I will sign off now but will arrange follow-up in our clinic  Diagnosis: Prosthetic mitral valve endocarditis  Culture Result: Viridans strep  Allergies  Allergen Reactions  . Amitriptyline Shortness Of Breath  . Serotonin Reuptake Inhibitors (Ssris) Nausea And Vomiting    Discharge antibiotics: Per pharmacy protocol Ceftriaxone and gentamicin  Duration: 6 weeks of ceftriaxone 2 weeks of gentamicin End Date: Ceftriaxone ends on 09/06/2016 Gentamicin ends on 08/12/2016  [facility] Per Protocol:  Labs 3 x weekly while on IV antibiotics: _x_ CBC with differential _x_ BMP __ CMP __ CRP __ ESR   __ Please pull PIC at completion of IV antibiotics _x_ Please leave PIC in place until doctor has seen patient or been notified  Fax weekly labs to [phone]  Clinic Follow Up Appt: I will arrange follow-up in our clinic on 08/12/2016.  Michel Bickers, MD [facility] [phone] pager   [phone] cell 07/29/2016, 2:56 PM

## 2016-07-29 NOTE — Telephone Encounter (Signed)
Oncology Nurse Navigator Documentation  Spoke with patient's wife Peter Bruce who was at his bedside.  She indicated he has improved over past days, on abx, now ambulatory.  He is to be DC'd to SNF today/tomorrow for rehab.   She stated activities r/t to radiotherapy planning (dental evaluation, CT SIM) need to be placed on hold for now.  She will keep me posted re his progress. Dr. Isidore Moos and Alvy Bimler informed.  Gayleen Orem, RN, BSN, [place] [facility] [phone]

## 2016-07-29 NOTE — Discharge Summary (Signed)
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 likely from atrial fibrillation versus emboli from endocarditis Active Problems:   History of pulmonary embolism   Paroxysmal atrial fibrillation ([place])   Dyslipidemia   Coronary artery disease involving native coronary artery   Lupus anticoagulant positive   Atrial flutter ([place])   Current use of long term anticoagulation   Chronic diastolic CHF (congestive heart failure) ([place])   Essential hypertension   Cancer of base of tongue ([place])   PMR (polymyalgia rheumatica) (HCC)   Benign essential HTN   Anemia of chronic disease   Thrombocytopenia (HCC)   Bradycardia   Syncope and collapse   Sepsis ([place])   Endocarditis of mitral valve   Clostridium difficile infection  Past Medical History:  Diagnosis Date  . Acute CHF (congestive heart failure) ([place]) 11/01/2015  . Acute on chronic diastolic heart failure ([place]) 09/27/2015  . Acute pulmonary embolus ([place]) 2010  . Acute respiratory failure with hypoxia ([place]) 09/09/2015  . Arthritis   . Atrial fibrillation [facility])    s/p inpatient DCCV 09/11/2015  . Atrial flutter ([place])   . CHF (congestive heart failure) ([place])   . Coronary artery disease involving native coronary artery 05/15/2015   multivessel  . Dysrhythmia    A-fib  . Essential hypertension   . Fibromyalgia   . History of kidney stones   . Hypercholesterolemia   . Hypertension   . Lupus anticoagulant positive 07/29/2008  . Mitral regurgitation 10/30/2015  . PMR (polymyalgia rheumatica) (HCC)   . Pneumonia    . 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 S/P Maze operation for atrial fibrillation 05/29/2015   Complete bilateral atrial lesion set via median sternotomy using bipolar radiofrequency and cryothermy ablation with clipping of LA appendage  . 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 APPENDECTOMY    . arm surgery  Right    wrist and elbow  . CARDIAC CATHETERIZATION N/A 05/15/2015   Procedure: Left Heart Cath and Coronary Angiography;  Surgeon: Peter M Martinique, MD;  Location: [place] CV LAB;  Service: Cardiovascular;  Laterality: N/A;  . CARDIAC CATHETERIZATION N/A 11/17/2015   Procedure: Right/Left Heart Cath and Coronary/Graft Angiography;  Surgeon: Adrian Prows, MD;  Location: [place] CV LAB;  Service: Cardiovascular;  Laterality: N/A;  . CARDIOVERSION N/A 09/11/2015   Procedure: CARDIOVERSION;  Surgeon: Lelon Perla, MD;  Location: [facility] ENDOSCOPY;  Service: Cardiovascular;  Laterality: N/A;  . CHEST TUBE INSERTION  11/21/2015   Procedure: LEFT CHEST TUBE INSERTION;  Surgeon: Rexene Alberts, MD;  Location: [place];  Service: Open Heart Surgery;;  . CLIPPING OF ATRIAL APPENDAGE  05/29/2015   Procedure: CLIPPING OF LEFT ATRIAL  APPENDAGE;  Surgeon: Rexene Alberts, MD;  Location: [place];  Service: Open Heart Surgery;;  45 AtriClip PRO 145  . CORONARY ARTERY BYPASS GRAFT N/A 05/29/2015   Procedure: CORONARY ARTERY BYPASS GRAFTING (CABG), ON PUMP, TIMES THREE, USING LEFT INTERNAL MAMMARY ARTERY, BILATERAL GREATER SAPHENOUS VEINS HARVESTED ENDOSCOPICALLY;  Surgeon: Rexene Alberts, MD;  Location: [place];  Service: Open Heart Surgery;  Laterality: N/A;  . FRACTURE  SURGERY     right wrist  . HERNIA REPAIR    . MAZE N/A 05/29/2015   Procedure: MAZE;  Surgeon: Rexene Alberts, MD;  Location: [place];  Service: Open Heart Surgery;  Laterality: N/A;  Complete Bi-Atrial Lesion set with Ablation and Cryothermy  . MITRAL VALVE REPAIR Right 11/21/2015   Procedure: MINIMALLY INVASIVE REOPERATION FOR MITRAL VALVE REPAIR;  Surgeon: Rexene Alberts, MD;  Location: [place];  Service: Open Heart Surgery;  Laterality: Right;  . PERIPHERAL VASCULAR CATHETERIZATION Bilateral 11/17/2015   Procedure: Renal Angiography;  Surgeon: Adrian Prows, MD;  Location: [place] CV LAB;  Service: Cardiovascular;  Laterality: Bilateral;  . PERIPHERAL VASCULAR CATHETERIZATION N/A 11/17/2015   Procedure: Abdominal Aortogram;  Surgeon: Adrian Prows, MD;  Location: [place] CV LAB;  Service: Cardiovascular;  Laterality: N/A;  . SHOULDER SURGERY Left    clavicular fracture  . TEE WITHOUT CARDIOVERSION N/A 05/29/2015   Procedure: TRANSESOPHAGEAL ECHOCARDIOGRAM (TEE);  Surgeon: Rexene Alberts, MD;  Location: [place];  Service: Open Heart Surgery;  Laterality: N/A;  . TEE WITHOUT CARDIOVERSION N/A 10/30/2015   Procedure: TRANSESOPHAGEAL ECHOCARDIOGRAM (TEE);  Surgeon: Adrian Prows, MD;  Location: [place];  Service: Cardiovascular;  Laterality: N/A;  . TEE WITHOUT CARDIOVERSION N/A 11/21/2015   Procedure: TRANSESOPHAGEAL ECHOCARDIOGRAM (TEE);  Surgeon: Rexene Alberts, MD;  Location: [place];  Service: Open Heart Surgery;  Laterality: N/A;  . TEE WITHOUT CARDIOVERSION N/A 07/26/2016   Procedure: TRANSESOPHAGEAL ECHOCARDIOGRAM (TEE);  Surgeon: Adrian Prows, MD;  Location[facility] ENDOSCOPY;  Service: Cardiovascular;  Laterality: N/A;    Allergies as of 07/29/2016      Reactions   Amitriptyline Shortness Of Breath   Serotonin Reuptake Inhibitors (ssris) Nausea And Vomiting      Medication List    STOP taking these medications   amoxicillin-clavulanate 875-125 MG tablet Commonly known as:  AUGMENTIN    diazepam 10 MG tablet Commonly known as:  VALIUM   rivaroxaban 20 MG Tabs tablet Commonly known as:  XARELTO   tiZANidine 4 MG tablet Commonly known as:  ZANAFLEX   valsartan 160 MG tablet Commonly known as:  DIOVAN     TAKE these medications   acetaminophen 325 MG tablet Commonly known as:  TYLENOL Take 2 tablets (650 mg total) by mouth every 6 (six) hours as needed for mild pain (or Fever >/= 101).   aspirin 325 MG tablet Take 1 tablet (325 mg total) by mouth daily. Start taking on:  07/30/2016   cefTRIAXone 2 g in dextrose 5 % 50 mL Inject 2 g into the vein daily.   furosemide 40 MG tablet Commonly known as:  LASIX Take 1 tablet (40 mg total) by mouth daily. Start taking on:  07/30/2016   gentamicin 1.6-0.9 MG/ML-% Commonly known as:  GARAMYCIN Inject 50 mLs (80 mg total) into the vein daily. Start taking on:  07/30/2016   metoprolol succinate 25 MG 24 hr tablet Commonly known as:  TOPROL-XL Take 1 tablet (25 mg total) by mouth daily. Start taking on:  07/30/2016 What changed:  how much to take   multivitamin with minerals Tabs tablet Take 1 tablet by mouth 3 (three) times a week.   ondansetron 8 MG disintegrating tablet Commonly known as:  ZOFRAN ODT Take 1 tablet (8 mg total) by mouth every 8 (eight) hours as needed for nausea or vomiting.   oxyCODONE-acetaminophen 5-325 MG tablet Commonly known as:  PERCOCET/ROXICET Take 1 tablet by mouth every 8 (eight) hours as needed for moderate pain. What changed:  when to take this  reasons to take this  additional instructions   potassium chloride SA 20 MEQ tablet Commonly known as:  K-DUR,KLOR-CON Take 1 tablet (20 mEq total) by mouth daily. Start taking on:  07/30/2016   rosuvastatin 10 MG tablet Commonly known as:  CRESTOR Take 1 tablet (10 mg total) by mouth daily at 6 PM.   saccharomyces boulardii 250 MG capsule Commonly known as:  FLORASTOR Take 1 capsule (250 mg total) by mouth 2 (two) times  daily.   sodium chloride flush 0.9 % Soln Commonly known as:  NS 10-40 mLs by Intracatheter route as needed (flush).   vancomycin 50 mg/mL oral solution Commonly known as:  VANCOCIN Take 2.5 mLs (125 mg total) by mouth every 6 (six) hours.   warfarin 5 MG tablet Commonly known as:  COUMADIN Take 1.5 tablets (7.5 mg total) by mouth one time only at 6 PM. Goal INR 2-3. Adjust warfarin dose based on daily INR - by MD at nursing facility   zolpidem 10 MG tablet Commonly known as:  AMBIEN Take 1 tablet (10 mg total) by mouth at bedtime as needed for sleep.       LABORATORY STUDIES CBC    Component Value Date/Time   WBC 18.6 (H) 07/26/2016 0345   RBC 3.27 (L) 07/26/2016 0345   HGB 9.8 (L) 07/26/2016 0345   HCT 31.1 (L) 07/26/2016 0345   PLT 110 (L) 07/26/2016 0345   MCV 95.1 07/26/2016 0345   MCV 97.0 12/22/2012 1252   MCH 30.0 07/26/2016 0345   MCHC 31.5 07/26/2016 0345   RDW 17.4 (H) 07/26/2016 0345   LYMPHSABS 1.3 07/25/2016 1400   MONOABS 1.7 (H) 07/25/2016 1400   EOSABS 0.5 07/25/2016 1400   BASOSABS 0.1 07/25/2016 1400   CMP    Component Value Date/Time   NA 138 07/26/2016 1245   K 3.8 07/26/2016 1245   CL 109 07/26/2016 1245   CO2 21 (L) 07/26/2016 1245   GLUCOSE 118 (H) 07/26/2016 1245   BUN 27 (H) 07/26/2016 1245   CREATININE 1.17 07/26/2016 1245   CREATININE 1.25 10/14/2015 1127   CALCIUM 8.2 (L) 07/26/2016 1245   PROT 5.2 (L) 07/25/2016 1400   ALBUMIN 2.5 (L) 07/25/2016 1400   AST 44 (H) 07/25/2016 1400   ALT 84 (H) 07/25/2016 1400   ALKPHOS 52 07/25/2016 1400   BILITOT 1.1 07/25/2016 1400   GFRNONAA >60 07/26/2016 1245   GFRAA >60 07/26/2016 1245   COAGS Lab Results  Component Value Date   INR 1.48 07/29/2016   INR 1.53 07/28/2016   INR 1.50 07/25/2016   Lipid Panel    Component Value Date/Time   CHOL 170 06/14/2016 0235   TRIG 133 06/14/2016 0235   HDL 24 (L) 06/14/2016 0235   CHOLHDL 7.1 06/14/2016 0235   VLDL 27 06/14/2016 0235    LDLCALC 119 (H) 06/14/2016 0235   HgbA1C  Lab Results  Component Value Date   HGBA1C 5.7 (H) 06/14/2016   Urinalysis    Component Value  Date/Time   COLORURINE YELLOW 07/24/2016 2022   APPEARANCEUR HAZY (A) 07/24/2016 2022   LABSPEC 1.019 07/24/2016 2022   PHURINE 5.0 07/24/2016 2022   GLUCOSEU 150 (A) 07/24/2016 2022   HGBUR LARGE (A) 07/24/2016 2022   BILIRUBINUR NEGATIVE 07/24/2016 2022   KETONESUR NEGATIVE 07/24/2016 2022   PROTEINUR 100 (A) 07/24/2016 2022   UROBILINOGEN 0.2 01/03/2013 1119   NITRITE NEGATIVE 07/24/2016 2022   LEUKOCYTESUR TRACE (A) 07/24/2016 2022    SIGNIFICANT DIAGNOSTIC STUDIES Ct Head Wo Contrast 07/27/2016 1. No acute intracranial hemorrhage. 2. Moderate age-related atrophy and chronic microvascular ischemic changes. Stable area of infarct in the inferior left cerebellar hemisphere in the distribution of the PICA as seen on the prior CT.  07/25/2016 No change since yesterday. Acute left PICA infarction with low-density in the inferior cerebellum on the left. No hemorrhage. No increased swelling. No fourth ventricular compromise.  07/24/2016 1. Focal low-attenuation in the left anterior temporal lobe consistent with progressing infarct in the distribution of the left middle cerebral artery and as previously seen on MRI.  2. New area of low-attenuation in the left inferior cerebellar hemisphere with associated mass effect is consistent with acute infarct in the distribution of the left posterior inferior cerebral artery. This was not present on prior MRI, suggesting a new interval process.  3. Chronic atrophy and small vessel ischemic changes. No acute intracranial hemorrhage.   Mri & Mra Head Wo Contrast 07/25/2016 1. 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 No associated acute hemorrhage or mass effect.  2. Scattered areas of DWI abnormality in the left  temporal lobe, right temporal lobe and right parietal lobe are favored to be areas of subacute ischemia. The distribution is again suggestive of a central embolic process, though likely occurring at a different time from the current acute left PICA infarct.  3. 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   MRI brain 06/12/2016 Multiple acute and subacute infarcts as described; the pattern favors central embolic disease. Moderate area of infarction in the lateral left temporal lobe. Mild petechial hemorrhage.  Dg Chest Portable 1 View 07/24/2016 1. Interval improvement of the previously seen right-sided pleural effusion and right lung base airspace densities with minimal amount of residual pleural effusion and associated right lung base atelectasis/infiltrate.  2. Stable cardiomegaly with probable mild vascular congestion. No pulmonary edema.      HISTORY OF PRESENT ILLNESS Peter Bruce  is a 66 y.o. male  with known coronary artery disease and underwent CABG 3 on 05/29/2015. 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  He was admitted on 06/11/2016 and discharged on 06/17/2016 with mitral valve and endocarditis, septic emboli to his brain, on IV antibiotic therapy.  He had streptococcal endocarditis. He was re-admitted with pneumonia on 07/19/16 and discharged 07/23/2016.  He was groaning on EMS arrival, noted to be bradycardic in the 30s, blood pressures in the 50s. He was given some fluids and improved. He did not come back completely to baseline and therefore CT was performed which demonstrates a fairly large cerebellar infarct. Prior to presentation, he had missed 2 doses of Xarelto, one day before the admission and on the day of admission (due to being exhausted and then because he was in the emergency room).  Events prior to the admission  noted  including bradycardia, altered mental status and hypotension and PVCs.  These symptoms have resolved.  Speaking with the patient and his wife, he did not need a walker prior to going to the hospital most recently. He has needed a walker since leaving the hospital yesterday. He has had trouble walking and slurred speech for a couple of days but his wife states that slurred speech seems to come and go and him and therefore not much was thought of it. His difficulty walking has been initiated to generalized deconditioning and weakness.  Of note, he also has neck cancer, squamous cell and is scheduled for radiation therapy. He also has diarrhea, loss of appetite, not feeling well.his past medical history significant for Paroxysmal atrial fibrillation, history of DVT and PE and has been on long-term anticoagulation.  Given concern for edema and concern for neurologic worsening from the cerebellar infarct, pt was admitted to the ICU for further evaluation and treatment.   HOSPITAL COURSE Peter Bruce is a 66 y.o. male with history of previous strokes, atrial fibrillation, endocarditis, minimally invasive mitral valve repair, maze operation for atrial fibrillation, CABG 3, lupus anticoagulant positive, hypertension, hyperlipidemia, coronary artery disease, congestive heart failure, and pulmonary embolus, presenting with slurred speech, difficult ambulation, bradycardia, and hypotension. He did not receive IV t-PA due to anticoagulation and recent strokes. Bacterial endocarditis treatment had been completed. The cerebellar infarct occurred in setting of L PICA occlusion not having taken xarelto x 2 doses. TEE repeated showed MV vegetation. Pt was afebrile with negative blood cultures. ID recommended IV ceftriaxone for treatment. Also being treated by ID for c.diff (antigen positive, toxin negative) with oral vancomycin. xarelto to be continued. Dr. Roxy Manns who had done previous MV surgery concerned with using  OACs in setting of valvular atrial fibrillation, so pt changed to coumadin. Will bridge with aspirin (not Lovenox) due to concern for cerebellar hemorrhage. He was discharged to SNF at wife's request for rehabilitation.   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 rule out  and mitral valve endocarditis as possible source with TEE today  Resultant  Mild dysarthria  Discussed with Neurosurgery [facility]); no surgical invention needed on admission  MRI - acute infarct of the inferior left cerebellar hemisphere.  MRA - occlusion of the left posterior inferior cerebellar artery. Suspected severe stenosis of the Lt [place].  CT 3/18 No change. Acute left PICA infarction (L inferior cerebellum).   Repeat CT head 3/20 stable  Carotid Doppler - 2/6/12018 - Bilateral - 1% to 39% ICA stenosis lowest end of scale. Vertebral artery flow is antegrade.  2D Echo - not done as had on 06/13/16   TEE  07/26/2016 Mitral valve vegetation  LDL - 119 (06/14/2016)  HgbA1c - 5.7 (06/14/2016)  Xarelto (rivaroxaban) daily prior to admission, changed to aspirin in the hospital with plans to resume Xarelto, however, due to concern for valvular atrial fibrillation, pt discharged on aspirin bridge to warfarin (not a lovenox candidate d/t risk of hemorrhage)  Patient counseled to be compliant with his antithrombotic medications  Ongoing aggressive stroke risk factor management  Therapy recommendations: HH OT, PT -> CIR. Agreeable to SNF at wife's request  Disposition: d/c to SNF for rehab, IV abx  Atrial Fibrillation  Home anticoagulation:  Xarelto (rivaroxaban) daily MAZE procedure and LAA closure during CABG 05/2015  Dr. Ricard Dillon concerned for valvular source  changed to aspirin in the hospital with plans to resume Xarelto, however, due to concern for valvular atrial  fibrillation, pt discharged on aspirin bridge to warfarin (not a lovenox candidate d/t risk of hemorrhage)   Native Valve Endocarditis  History of minimally invasive mitral valve repair on 11/21/2015  Had completed abx course  ID saw, do not suspect recurrent endocarditis, nor that endocarditis was source of stroke, however TEE showed MV vegetation  Given large persistent MV vegetation following 6 weeks of IV ceftriaxone therapy to treat viridans streptococcal prosthetic valve endocarditis. ID believes that his recent acute stroke was due to embolization from the vegetation. Although he does not have clear evidence of persistent infection (he is afebrile and recent blood cultures obtained on antibiotics are negative)  still have some concern about undertreated infection. Dr. Megan Salon reviewed treatment options with my partners and have elected to start once daily, low-dose gentamicin. His renal function has returned to normal but his risk for nephrotoxicity is relatively high. His renal function and gentamicin levels will need to be monitored very closely here and after discharge to a skilled nursing facility.  PICC replaced 07/28/2016  abx plans:  Continue ceftriaxone for 6 weeks (day of discharge D#4). Ceftriaxone ends on 09/06/2016  Add gentamycin for 2 weeks. Gentamicin ends on 08/12/2016  Clostridium difficile   Continue oral vancomycin and probiotic (day of discharge D#3)  Hypertension  Hold BP meds given low BP in hospital  BP goal normotensive  monitior, resume home diovan as appropriate  Hyperlipidemia  Home meds: No lipid lowering medications prior to admission  LDL 119, goal < 70  Crestor 10 mg daily  Continue statin at discharge  Other Stroke Risk Factors  Advanced age  Overweight, Body mass index is 27.96 kg/m., recommend weight loss, diet and exercise as appropriate   Hx stroke/TIA  Coronary artery disease - Atherosclerosis of native coronary artery  of native heart with angina pectoris S/P CABG x 3 on 05/29/2015 withLIMA to LAD, SVG to OM2, SVG to RCA; and Maze procedure and clipping of left atrial appendage.  Atrial fibrillation  Medical noncompliance  Endocarditis  Lupus anticoagulant positive, Hypercoagulable state  Chronic diastolic CHF  Hx PE 10 years ago  Other Active Problems  bradycardia- no change in meds per cardiology  Syncope and collapse in setting of severe bradycardia, cerebellar stroke  Anemia of chronic disease - 9.9 ; 29  Thrombocytopenia - 110 K ->95  Leukocytosis - 18.6 ( Tmax 99.7)   Kidney disease - 29 ; 1.51  Recent [place] admission - 07/19/2016 - 07/23/2016 - pneumonia  Cancer at base of tongue, follow up with oncology at d/c   Sepsis   PMR on chronic steroids  Hypokalemia - started on lasix and KCL replacement by Dr. Einar Gip during hospitalization. Monitor. Notify him of concerns.   DISCHARGE EXAM Blood pressure 96/76, pulse 86, temperature 97.9 F (36.6 C), temperature source Oral, resp. rate 20, height 5\' 10"  (1.778 m), weight 88.4 kg (194 lb 14.2 oz), SpO2 96 %. General - Well nourished, well developed, not in distress  Cardiovascular - irregular rate and rhythm; III/VI murmur Pulmonary: CTA Abdomen: NT, ND, normal bowel sounds Extremities: No C/C; positive for edema right>left LE's  Neurological Exam Mental Status: awake alert; follows commands; flat affect Orientation:  Oriented to person, place and year; not month despite several cues patient answered "May" which is his birth month Speech:  Fluent; slight scanning dysarthria Cranial Nerves:  PERRL; EOMI; visual fields full to  threat, face grossly symmetric, hearing grossly intact; shrug symmetric and tongue midline Motor Exam:  Tone:  Within normal limits; Strength: 5/5 in UEs; patient states bilateral legs feel heavy since he has increased edema and pain in legs.  Exam is 3/5 in LEs bilaterally but effort is poor Sensory:  Intact to light touch throughout Coordination:  Intact finger to nose Gait: Deferred  Discharge Diet   DIET DYS 3 Room service appropriate? Yes; Fluid consistency: Thin liquids  DISCHARGE PLAN  Disposition:  Discharge to skilled nursing facility for ongoing PT, OT and ST.   warfarin daily for secondary stroke prevention with aspirin bridge. Stop aspirin once INR therapeutic. Check INR daily. Goal 2-3  Complete abx as prescribed  Ongoing stroke risk factor control at time of discharge  Follow-up PCP on MD in nursing home   Follow up Dr. Nadyne Coombes in 1 month  Follow-up ID clinic. Dr. Megan Salon to arrange  Follow up oncology for cancer treatment.   Follow-up with Dr. Antony Contras, Stroke Clinic in 6 weeks, office to schedule an appointment.  60 minutes were spent preparing discharge.  [place] for Pager information 07/29/2016 4:25 PM   I have personally examined this patient, reviewed notes, independently viewed imaging studies, participated in medical decision making and plan of care.ROS completed by me personally and pertinent positives fully documented  I have made any additions or clarifications directly to the above note. Agree with note above.   Antony Contras, MD Medical Director [facility] Pager: [phone] 07/30/2016 12:51 PM

## 2016-07-29 NOTE — Progress Notes (Addendum)
  Speech Language Pathology Treatment: Dysphagia  Patient Details Name: Peter Bruce MRN: [id number] DOB: [birth year] Today's Date: 07/29/2016 Time: 1100-1140 SLP Time Calculation (min) (ACUTE ONLY): 40 min  Assessment / Plan / Recommendation Clinical Impression  Reinforced swallow strategies with min verbal cues, pt able to carry over preventative cough, second in session independently, but likely does not recall without reminders during meals. Initiated Expiratory Muscle Strength training to aid in stregthening laryngeal closure and airway clearance in setting of dysphagia related to BOT cancer and decompensation. Pts Mean ExpiratoryPressure near LLN for age (69 cm H20), so EMST warranted to improve/maintain force of cough for airway protection. Pt was able to complete 4 sets of 5 repetitions (20 reps) on EMST 150 device set to one full turn and one quarter turn below 60 cm H20 with max encouragement needed. Provided written instruction and verbal instruction to pt. Wife not present for education. Pt unlikely to complete exercises with encouragement from family. Further education needed.   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 Presented with episode of unresponsiveness. HEad CT shows new CVA (Occlusion of the left posterior inferior cerebellar artery with associated acute infarct of the inferior left cerebellar hemisphere, Scattered areas of DWI abnormality in the left temporal lobe, right temporal lobe and right parietal lobe are favored to be areas of subacute ischemia). Recently discharged from [place] where he was admitted with generalized weakness and found to have NCAP with  acute hypoxia nad respiratory failure. Admission CXR: Interval improvement of the previously seen right-sided pleural effusion and right lung base airspace densities with minimal amount of residual pleural effusion and associated right lung base atelectasis/infiltrate. Note that bedside swallow evaluation complete during 06/2016 admission with noted s/s of aspiration, recommendations for dysphagia 3, thin liquids and MBS however at the time patient declined MBS.       SLP Plan  Continue with current plan of care       Recommendations  Diet recommendations: Thin liquid;Dysphagia 3 (mechanical soft) Liquids provided via: Cup;Straw Medication Administration: Whole meds with puree Supervision: Full supervision/cueing for compensatory strategies;Staff to assist with self feeding Compensations: Minimize environmental distractions;Slow rate;Small sips/bites;Hard cough after swallow;Multiple dry swallows after each bite/sip Postural Changes and/or Swallow Maneuvers: Seated upright 90 degrees                General recommendations: Rehab consult Oral Care Recommendations: Oral care before and after PO Follow up Recommendations: Inpatient Rehab SLP Visit Diagnosis: Dysphagia, pharyngeal phase (R13.13) Plan: Continue with current plan of care       GO               [facility], MA CCC-SLP [phone]  Lynann Beaver 07/29/2016, 2:21 PM

## 2016-07-29 NOTE — Progress Notes (Signed)
STROKE TEAM PROGRESS NOTE   SUBJECTIVE (INTERVAL HISTORY) His wife is  at bedside. He was started on warfarin last night after discussion with Dr. Roxy Manns and Dr. Einar Gip. We will not bridge with Lovenox due to his large stroke and increased risk for hemorrhagic transformation. Social worker has initiated proceedings for rehabilitation and skilled nursing setting. Inpatient rehabilitation has no beds at the present time. Patient's wife is in agreement  OBJECTIVE Temp:  [97.9 F (36.6 C)-99.6 F (37.6 C)] 97.9 F (36.6 C) (03/22 1247) Pulse Rate:  [42-103] 86 (03/22 1247) Cardiac Rhythm: Normal sinus rhythm (03/22 1130) Resp:  [13-29] 20 (03/22 1247) BP: (82-127)/(55-112) 96/76 (03/22 1247) SpO2:  [92 %-100 %] 96 % (03/22 1247)  CBC:   Recent Labs Lab 07/24/16 2008  07/25/16 1400 07/26/16 0345  WBC 18.6*  < > 21.1* 18.6*  NEUTROABS 15.7*  --  17.5*  --   HGB 9.6*  < > 10.1* 9.8*  HCT 30.0*  < > 31.7* 31.1*  MCV 95.5  < > 95.2 95.1  PLT 72*  < > 95* 110*  < > = values in this interval not displayed.  Basic Metabolic Panel:   Recent Labs Lab 07/24/16 2008  07/25/16 0832 07/25/16 1400 07/26/16 1245  NA 136  < > 137 134* 138  K 4.2  < > 4.4 4.0 3.8  CL 104  < > 106 105 109  CO2 24  --  20* 21* 21*  GLUCOSE 124*  < > 92 109* 118*  BUN 36*  < > 33* 29* 27*  CREATININE 1.66*  < > 1.38* 1.51* 1.17  CALCIUM 8.0*  --  8.2* 8.2* 8.2*  MG 2.0  --  2.0  --   --   < > = values in this interval not displayed.  Lipid Panel:     Component Value Date/Time   CHOL 170 06/14/2016 0235   TRIG 133 06/14/2016 0235   HDL 24 (L) 06/14/2016 0235   CHOLHDL 7.1 06/14/2016 0235   VLDL 27 06/14/2016 0235   LDLCALC 119 (H) 06/14/2016 0235   HgbA1c:  Lab Results  Component Value Date   HGBA1C 5.7 (H) 06/14/2016   Urine Drug Screen: No results found for: LABOPIA, COCAINSCRNUR, LABBENZ, AMPHETMU, THCU, [place]       TEE  07/26/16: MV: mitral valve ring appears to be intact without  paravalvular leak. There is a very large vegetation involving the entire mitral valve leaflet measuring about 3.7 x 1.5 cm. There is a posteriorly directed very mild mitral regurgitation. Mean gradient across the mitral valve is 10 mmHg, mitral valve area by pressure halftime is 1.9 cm, findings consistent with mild to moderate mitral stenosis.   PHYSICAL EXAM Physical Exam General - Well nourished, well developed, not in distress  Cardiovascular - irregular rate and rhythm; III/VI murmur Pulmonary: CTA Abdomen: NT, ND, normal bowel sounds Extremities: No C/C; positive for edema right>left LE's  Neurological Exam Mental Status: awake alert; follows commands; flat affect Orientation:  Oriented to person, place and year; not month despite several cues patient answered "May" which is his birth month Speech:  Fluent; slight scanning dysarthria  Cranial Nerves:  PERRL; EOMI; visual fields full to threat, face grossly symmetric, hearing grossly intact; shrug symmetric and tongue midline  Motor Exam:  Tone:  Within normal limits; Strength: 5/5 in UEs; patient states bilateral legs feel heavy since he has increased edema and pain in legs.  Exam is 3/5 in LEs bilaterally but effort  is poor  Sensory: Intact to light touch throughout  Coordination:  Intact finger to nose  Gait: Deferred  ASSESSMENT/PLAN [name] is a 66 y.o. male with history of previous strokes, atrial fibrillation, endocarditis, minimally invasive mitral valve repair, maze operation for atrial fibrillation, CABG 3, lupus anticoagulant positive, hypertension, hyperlipidemia, coronary artery disease, congestive heart failure, and pulmonary embolus, presenting with slurred speech, difficult ambulation, bradycardia, and hypotension. He did not receive IV t-PA due to anticoagulation and recent strokes.  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 rule out  and mitral valve endocarditis as possible source with TEE today  Resultant  Mild dysarthria  Discussed with Neurosurgery Saintclair Halsted); no surgical invention needed on admission  MRI - acute infarct of the inferior left cerebellar hemisphere.  MRA - occlusion of the left posterior inferior cerebellar artery. Suspected severe stenosis of the Lt [place].  CT 3/18 No change. Acute left PICA infarction (L inferior cerebellum).   Repeat CT head in am to check edema  Carotid Doppler - 2/6/12018 - Bilateral - 1% to 39% ICA stenosis lowest end of scale. Vertebral artery flow is antegrade.  2D Echo - not done as had on 06/13/16   TEE  Mitral valve vegetation  LDL - 119 (06/14/2016)  HgbA1c - 5.7 (06/14/2016)  VTE prophylaxis - SCDs DIET DYS 3 Room service appropriate? Yes; Fluid consistency: Thin  Xarelto (rivaroxaban) daily prior to admission, now on aspirin 300 mg suppository daily  Patient counseled to be compliant with his antithrombotic medications  Ongoing aggressive stroke risk factor management  Therapy recommendations: HH OT, PT pending   Transfer to neuro floor bed  Disposition: Pending  Atrial Fibrillation  Home anticoagulation:  Xarelto (rivaroxaban) daily MAZE procedure and LAA closure during CABG 05/2015  resume anticoagulation if endocarditis fully resoved   Native Valve Endocarditis  History of minimally invasive mitral valve repair on 11/21/2015  Off abx as of yesterday  ID saw, do not suspect recurrent endocarditis, nor that endocarditis was source of stroke  Hypertension  Stable  Recommend permissive hypertension as medically tolerated given cardiac disease (OK if < 220/120) but gradually normalize in 5-7 days  Long-term BP goal normotensive  Hyperlipidemia  Home meds: No lipid lowering medications prior to admission  LDL 119, goal < 70  Add statin when PO access is  available  Continue statin at discharge  Other Stroke Risk Factors  Advanced age  Overweight, Body mass index is 27.96 kg/m., recommend weight loss, diet and exercise as appropriate   Hx stroke/TIA  Coronary artery disease - Atherosclerosis of native coronary artery of native heart with angina pectoris S/P CABG x 3 on 05/29/2015 withLIMA to LAD, SVG to OM2, SVG to RCA; and Maze procedure and clipping of left atrial appendage.  Atrial fibrillation  Medical noncompliance  Endocarditis  Lupus anticoagulant positive, Hypercoagulable state  Chronic diastolic CHF  Hx PE 10 years ago  Other Active Problems  bradycardia- no change in meds per cardiology  Syncope and collapse in setting of severe bradycardia, cerebellar stroke  Anemia of chronic disease - 9.9 ; 29  Thrombocytopenia - 110 K ->95  Leukocytosis - 18.6 ( Tmax 99.7)   Kidney disease - 29 ; 1.51  Recent [place] admission - 07/19/2016 - 07/23/2016 - pneumonia  Cancer at base of tongue, follow up with oncology at d/c   Sepsis  PMR on chronic steroids  Hospital day # 4     .Marland Kitchen Plan to  Discharge to SNF rehab either today or tomorrow whenever bed is available. Patient is medically stable for discharge. Continue aspirin and warfarin until INR is therapeutic then discontinue aspirin and stay on warfarin alone with INR goal between 2 and 3. . Greater than 50% time during this 25 minute visit was spent on counseling and coordination of care about his embolic stroke, atrial fibrillation, mitral valve endocarditis and discussion with wife and Education officer, museum and answered questions   . Antony Contras, MD Medical Director [facility] Pager: [phone] 07/29/2016 1:53 PM  To contact Stroke Continuity provider, please refer to [email or web address] After hours, contact General Neurology

## 2016-07-29 NOTE — Care Management Note (Signed)
Case Management Note  Patient Details  Name: Diago Haik MRN: [id number] Date of Birth: Nov 11, 1950  Subjective/Objective:     Pt admitted with a new stroke - recently discharged with endocarditis        Action/Plan:   PTA from home with wife - wife will provide 24 hours supervision - pt uses walker.  PT recommending HHPT wife in agreement/ wife also requested [facility] for disease management - pt is off unit at procedure - no orders written yet however CM will request them from physician via sticky notes.  Wife states they are familiar with [facility] and would like to utilize them for [facility] at this time.    CM will continue to follow for discharge needs  3/21 14:47 Addendum Carles Collet RN CM. Referral made to [place] to follow for IV med needs after DC, and [facility] for [facility] needs after DC. CM will continue to follow.   Expected Discharge Date:  07/29/16               Expected Discharge Plan:  [place]  In-House Referral:     Discharge planning Services  CM Consult  Post Acute Care Choice:    Choice offered to:  Patient  DME Arranged:    DME Agency:     HH Arranged:    [place] Agency:     Status of Service:  In process, will continue to follow  If discussed at Long Length of Stay Meetings, dates discussed:    Additional Comments: 07/29/2016 CM contacted by attending - pt is ready for discharge today.  PT recommends CIR - however CM contacted CIR and pt will not be able to have a bed until tomorrow if accepted, CIR MD will not be able to even assess pt until around 4pm today.  CSW consulted and informed - actively working on bed placement. Maryclare Labrador, RN 07/29/2016, 11:59 AM

## 2016-07-29 NOTE — Clinical Social Work Placement (Signed)
   CLINICAL SOCIAL WORK PLACEMENT  NOTE  Date:  07/29/2016  Patient Details  Name: Peter Bruce MRN: [id number] Date of Birth: Jun 20, 1950  Clinical Social Work is seeking post-discharge placement for this patient at the [place] level of care (*CSW will initial, date and re-position this form in  chart as items are completed):  Yes   Patient/family provided with [place] Work Department's list of facilities offering this level of care within the geographic area requested by the patient (or if unable, by the patient's family).  Yes   Patient/family informed of their freedom to choose among providers that offer the needed level of care, that participate in Medicare, Medicaid or managed care program needed by the patient, have an available bed and are willing to accept the patient.  Yes   Patient/family informed of [facility]'s ownership interest in [facility] and [facility], as well as of the fact that they are under no obligation to receive care at these facilities.  PASRR submitted to EDS on 07/29/16     PASRR number received on 07/29/16     Existing PASRR number confirmed on       FL2 transmitted to all facilities in geographic area requested by pt/family on 07/29/16     FL2 transmitted to all facilities within larger geographic area on       Patient informed that his/her managed care company has contracts with or will negotiate with certain facilities, including the following:        Yes   Patient/family informed of bed offers received.  Patient chooses bed at [facility]     Physician recommends and patient chooses bed at      Patient to be transferred to [facility] on 07/29/16.  Patient to be transferred to facility by PTAR     Patient family notified on 07/29/16 of transfer.  Name of family member notified:  Beverlee Nims     PHYSICIAN Please prepare priority discharge summary, including medications     Additional Comment:     _______________________________________________ Benard Halsted, LCSWA 07/29/2016, 4:00 PM

## 2016-07-29 NOTE — Progress Notes (Signed)
Rehab Admissions Coordinator Note:  Patient was screened by [name] for appropriateness for an Inpatient Acute Rehab Consult.  At this time, we are recommending [place].  I spoke with [name] and plans are for discharge to SNF today.  Currently rehab beds are full on inpatient rehab.  [name] 07/29/2016, 4:09 PM  I can be reached at [phone].

## 2016-07-29 NOTE — Care Management Note (Signed)
Case Management Note  Patient Details  Name: Peter Bruce MRN: [id number] Date of Birth: 02/04/1951  Subjective/Objective:                    Action/Plan: Pt discharging to [facility] today. No further needs per CM.   Expected Discharge Date:  07/29/16               Expected Discharge Plan:  [place]  In-House Referral:  Clinical Social Work  Discharge planning Services  CM Consult  Post Acute Care Choice:    Choice offered to:  Patient  DME Arranged:    DME Agency:     HH Arranged:    [place] Agency:     Status of Service:  Completed, signed off  If discussed at [employer], dates discussed:    Additional Comments:  Pollie Friar, RN 07/29/2016, 4:40 PM

## 2016-07-29 NOTE — NC FL2 (Signed)
[place] LEVEL OF CARE SCREENING TOOL     IDENTIFICATION  Patient Name: Peter Bruce Birthdate: [birth year] Sex: male Admission Date (Current Location): 07/24/2016  [facility] [place] Number:  Herbalist and Address:  [facility] [place] [place] [address], [place], [place] [address]      Provider Number: [id number]  Attending Physician Name and Address:  Garvin Fila, MD  Relative Name and Phone Number:       Current Level of Care: Hospital Recommended Level of Care: [place] Prior Approval Number:    Date Approved/Denied:   PASRR Number: [id number]  Discharge Plan: SNF    Current Diagnoses: Patient Active Problem List   Diagnosis Date Noted  . Endocarditis of mitral valve 07/26/2016  . CVA (cerebral vascular accident) ([place]) 07/25/2016  . Bradycardia 07/24/2016  . Syncope and collapse 07/24/2016  . Sepsis ([place]) 07/24/2016  . Lobar pneumonia ([place])   . HCAP (healthcare-associated pneumonia) 07/19/2016  . AKI (acute kidney injury) ([place]) 07/19/2016  . Dehydration 07/19/2016  . Cerebrovascular accident (CVA) due to bilateral embolism of middle cerebral arteries ([place])   . Lupus erythematosus   . PMR (polymyalgia rheumatica) (HCC)   . Coronary artery disease involving coronary bypass graft of native heart without angina pectoris   . Benign essential HTN   . Tachypnea   . Leukocytosis   . Anemia of chronic disease   . Thrombocytopenia ([place])   . Cancer of base of tongue ([place]) 06/14/2016  . Cerebral embolism with cerebral infarction 06/14/2016  . Streptococcal bacteremia   . Prosthetic valve endocarditis ([place])   . Septic embolism ([place])   . Hypercoagulable state ([place])   . Other streptococcal sepsis ([facility]) 06/12/2016  . Sepsis, unspecified organism ([place]) 06/11/2016  . Hyponatremia 06/11/2016  . Normocytic anemia 06/11/2016  . S/P minimally invasive mitral valve repair 11/21/2015  . Essential hypertension   . Mitral  regurgitation 10/30/2015  . Fatigue 10/14/2015  . Current use of long term anticoagulation 09/27/2015  . Bilateral pleural effusion 09/27/2015  . Chronic diastolic CHF (congestive heart failure) ([place]) 09/27/2015  . Respiratory failure with hypoxia ([place]) 09/09/2015  . Atrial flutter ([place])   . S/P CABG x 3 and maze procedure 05/29/2015  . Coronary artery disease involving native coronary artery 05/15/2015  . Dyslipidemia 04/14/2015  . Polymyalgia rheumatica--rheumatol [place]-2016 12/19/2013  . Obesity (BMI 30-39.9)   . BPH (benign prostatic hyperplasia) 08/30/2011  . Paroxysmal atrial fibrillation ([place]) 08/30/2011  . Chronic pain 08/30/2011  . Anxiety 04/21/2011  . History of pulmonary embolism 10/02/2008  . Lupus anticoagulant positive 07/29/2008    Orientation RESPIRATION BLADDER Height & Weight     Self, Situation  O2 (Nasal Cannula, 2L) Continent Weight: 194 lb 14.2 oz (88.4 kg) Height:  5\' 10"  (177.8 cm)  BEHAVIORAL SYMPTOMS/MOOD NEUROLOGICAL BOWEL NUTRITION STATUS      Continent  (Please see discharge summary)  AMBULATORY STATUS COMMUNICATION OF NEEDS Skin   Limited Assist Verbally                         Personal Care Assistance Level of Assistance  Bathing, Feeding, Dressing Bathing Assistance: Limited assistance Feeding assistance: Independent Dressing Assistance: Limited assistance     Functional Limitations Info  Sight, Hearing, Speech Sight Info: Adequate Hearing Info: Adequate Speech Info: Adequate    SPECIAL CARE FACTORS FREQUENCY  OT (By licensed OT), PT (By licensed PT)     PT  Frequency: 3x OT Frequency: 3x            Contractures Contractures Info: Not present    Additional Factors Info  Code Status, Allergies, Isolation Precautions Code Status Info: Full Code Allergies Info: Amitriptyline, Serotonin Reuptake Inhibitors ([place])     Isolation Precautions Info: Enteric Pre     Current Medications (07/29/2016):  This is the current  hospital active medication list Current Facility-Administered Medications  Medication Dose Route Frequency Provider Last Rate Last Dose  .  stroke: mapping our early stages of recovery book   Does not apply Once Toy Baker, MD      . 0.9 %  sodium chloride infusion   Intravenous Continuous Jeryl Columbia, NP   Stopped at 07/27/16 1900  . acetaminophen (TYLENOL) tablet 650 mg  650 mg Oral Q4H PRN Toy Baker, MD   650 mg at 07/25/16 2331   Or  . acetaminophen (TYLENOL) solution 650 mg  650 mg Per Tube Q4H PRN Toy Baker, MD       Or  . acetaminophen (TYLENOL) suppository 650 mg  650 mg Rectal Q4H PRN Toy Baker, MD      . aspirin suppository 300 mg  300 mg Rectal Daily Toy Baker, MD       Or  . aspirin tablet 325 mg  325 mg Oral Daily Toy Baker, MD   325 mg at 07/29/16 1020  . cefTRIAXone (ROCEPHIN) 2 g in dextrose 5 % 50 mL IVPB  2 g Intravenous Q24H Michel Bickers, MD   2 g at 07/28/16 1731  . Chlorhexidine Gluconate Cloth 2 % PADS 6 each  6 each Topical Daily Garvin Fila, MD   6 each at 07/29/16 1147  . [START ON 07/30/2016] furosemide (LASIX) tablet 40 mg  40 mg Oral Daily Adrian Prows, MD      . hydrALAZINE (APRESOLINE) injection 10 mg  10 mg Intravenous Q3H PRN Garvin Fila, MD   10 mg at 07/29/16 0236  . MEDLINE mouth rinse  15 mL Mouth Rinse BID Reyne Dumas, MD   15 mL at 07/28/16 1000  . metoprolol succinate (TOPROL-XL) 24 hr tablet 25 mg  25 mg Oral Daily Adrian Prows, MD   25 mg at 07/29/16 1020  . ondansetron (ZOFRAN-ODT) disintegrating tablet 8 mg  8 mg Oral Q8H PRN Garvin Fila, MD   8 mg at 07/27/16 1748  . oxyCODONE-acetaminophen (PERCOCET/ROXICET) 5-325 MG per tablet 1 tablet  1 tablet Oral Q8H PRN Adrian Prows, MD   1 tablet at 07/28/16 1751  . potassium chloride SA (K-DUR,KLOR-CON) CR tablet 20 mEq  20 mEq Oral Daily Adrian Prows, MD   20 mEq at 07/29/16 1146  . rosuvastatin (CRESTOR) tablet 10 mg  10 mg Oral q1800 Adrian Prows, MD    10 mg at 07/28/16 1858  . saccharomyces boulardii (FLORASTOR) capsule 250 mg  250 mg Oral BID Michel Bickers, MD   250 mg at 07/29/16 1020  . sodium chloride flush (NS) 0.9 % injection 10-40 mL  10-40 mL Intracatheter Q12H Garvin Fila, MD   10 mL at 07/29/16 1020  . sodium chloride flush (NS) 0.9 % injection 10-40 mL  10-40 mL Intracatheter PRN Garvin Fila, MD      . vancomycin (VANCOCIN) 50 mg/mL oral solution 125 mg  125 mg Oral Q6H Michel Bickers, MD   125 mg at 07/29/16 1146  . warfarin (COUMADIN) tablet 7.5 mg  7.5 mg Oral ONCE-1800 Dion Body  Bjorn Loser, [facility]      . Warfarin - Pharmacist Dosing Inpatient   Does not apply [id number] Garvin Fila, MD      . zolpidem (AMBIEN) tablet 10 mg  10 mg Oral QHS PRN Adrian Prows, MD   10 mg at 07/28/16 2235     Discharge Medications: Please see discharge summary for a list of discharge medications.  Relevant Imaging Results:  Relevant Lab Results:   Additional Information SSN: [id number]  Eileen Stanford, LCSW  I have personally examined this patient, reviewed notes, independently viewed imaging studies, participated in medical decision making and plan of care.ROS completed by me personally and pertinent positives fully documented  I have made any additions or clarifications directly to the above note. Agree with note above.   Antony Contras, MD Medical Director [facility] Pager: [phone] 07/29/2016 1:57 PM

## 2016-07-29 NOTE — Progress Notes (Signed)
[facility] for warfarin Indication: atrial fibrillation and stroke (also hx PE/DVT with lupus AC positive; MV repair)  Allergies  Allergen Reactions  . Amitriptyline Shortness Of Breath  . Serotonin Reuptake Inhibitors (Ssris) Nausea And Vomiting    Patient Measurements: Height: 5\' 10"  (177.8 cm) Weight: 194 lb 14.2 oz (88.4 kg) IBW/kg (Calculated) : 73  Vital Signs: Temp: 99.6 F (37.6 C) (03/22 0741) Temp Source: Oral (03/22 0741) BP: 107/75 (03/22 0700) Pulse Rate: 100 (03/22 0700)  Labs:  Recent Labs  07/26/16 1245 07/28/16 1855 07/29/16 0633  LABPROT  --  18.5* 18.1*  INR  --  1.53 1.48  CREATININE 1.17  --   --     Estimated Creatinine Clearance: 68.6 mL/min (by C-G formula based on SCr of 1.17 mg/dL).   Medical History: Past Medical History:  Diagnosis Date  . Acute CHF (congestive heart failure) ([place]) 11/01/2015  . Acute on chronic diastolic heart failure ([place]) 09/27/2015  . Acute pulmonary embolus ([place]) 2010  . Acute respiratory failure with hypoxia ([place]) 09/09/2015  . Arthritis   . Atrial fibrillation [facility])    s/p inpatient DCCV 09/11/2015  . Atrial flutter ([place])   . CHF (congestive heart failure) ([place])   . Coronary artery disease involving native coronary artery 05/15/2015   multivessel  . Dysrhythmia    A-fib  . Essential hypertension   . Fibromyalgia   . History of kidney stones   . Hypercholesterolemia   . Hypertension   . Lupus anticoagulant positive 07/29/2008  . Mitral regurgitation 10/30/2015  . PMR (polymyalgia rheumatica) ([facility])   . Pneumonia   . 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 S/P Maze operation for atrial fibrillation 05/29/2015   Complete bilateral atrial lesion set via median sternotomy using bipolar radiofrequency and cryothermy ablation with clipping of LA appendage  . 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 male with CVA and endocarditis and noted with history of previous strokes, atrial fibrillation, endocarditis, minimally invasive mitral valve repair, lupus anticoagulant positive with DVT/PE. Pharmacy consulted to dose coumadin (Spoke with Dr. Leonie Man: no heparin bridge due to risk of hemorrhagic transformation).  -INR= 1.48  Goal of Therapy:  INR 2-3 Monitor platelets by anticoagulation protocol: Yes   Plan:  Give warfarin 7.5mg  po x 1 Monitor daily INR CBC in am  Hildred Laser, Pharm D 07/29/2016 8:24 AM

## 2016-07-29 NOTE — Progress Notes (Signed)
Physical Therapy Treatment Patient Details Name: Peter Bruce MRN: [id number] DOB: [birth year] Today's Date: 07/29/2016    History of Present Illness Peter Bruce is a 66 y.o. 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 Recently discharged from hospital for Pna and confusion. 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 Pt's wife needs pt to be stronger and more independent prior to return home. Recommend CIR to help pt achieve that level in order to eventually return home.  Follow Up Recommendations  CIR     Equipment Recommendations  None recommended by PT    Recommendations for Other Services       Precautions / Restrictions Precautions Precautions: Fall Restrictions Weight Bearing Restrictions: No    Mobility  Bed Mobility Overal bed mobility: Needs Assistance Bed Mobility: Supine to Sit     Supine to sit: Min assist;HOB elevated     General bed mobility comments: Assist to bring legs off of bed and elevate trunk into sitting. Pt requires verbal cues for sequencing and requires incr time and effort.   Transfers Overall transfer level: Needs assistance Equipment used: Rolling walker (2 wheeled) Transfers: Sit to/from Stand Sit to Stand: Min guard         General transfer comment: Assist for balance and incr time to rise  Ambulation/Gait Ambulation/Gait assistance: Min assist;+2 safety/equipment (followed with chair to incr amb distance) Ambulation Distance (Feet): 90 Feet Assistive device: Rolling walker (2 wheeled) Gait Pattern/deviations: Step-through pattern;Decreased step length - right;Decreased  step length - left;Wide base of support;Trunk flexed;Shuffle Gait velocity: decreased Gait velocity interpretation: Below normal speed for age/gender General Gait Details: Assist for balance for balance and support. Pt took 3-4 standing rest breaks due to fatigue. Pt looking down at floor and bumping objects with walker despite verbal cues to look up.   Stairs            Wheelchair Mobility    Modified Rankin (Stroke Patients Only)       Balance Overall balance assessment: Needs assistance Sitting-balance support: Feet supported;No upper extremity supported Sitting balance-Leahy Scale: Fair     Standing balance support: Bilateral upper extremity supported Standing balance-Leahy Scale: Poor Standing balance comment: Walker and min guard for static standing                    Cognition Arousal/Alertness: Awake/alert Behavior During Therapy: Flat affect Overall Cognitive Status: Impaired/Different from baseline Area of Impairment: Problem solving;Memory;Safety/judgement;Following commands     Memory: Decreased short-term memory Following Commands: Follows multi-step commands with increased time Safety/Judgement: Decreased awareness of safety;Decreased awareness of deficits   Problem Solving: Slow processing;Difficulty sequencing;Requires verbal cues;Requires tactile cues      Exercises      General Comments        Pertinent Vitals/Pain Pain Assessment: Faces Faces Pain Scale: Hurts little more Pain Location: scrotum, back Pain Descriptors / Indicators: Sore;Grimacing Pain Intervention(s): Limited activity within patient's tolerance;Monitored during session;Premedicated before session    Home Living                      Prior Function  PT Goals (current goals can now be found in the care plan section) Progress towards PT goals: Progressing toward goals    Frequency    Min 3X/week      PT Plan Discharge plan needs to be  updated    Co-evaluation             End of Session Equipment Utilized During Treatment: Gait belt;Oxygen Activity Tolerance: Patient limited by fatigue;Patient tolerated treatment well Patient left: in chair;with call bell/phone within reach;with chair alarm set Nurse Communication: Mobility status PT Visit Diagnosis: Muscle weakness (generalized) (M62.81);Unsteadiness on feet (R26.81)     Time: 2878-6767 PT Time Calculation (min) (ACUTE ONLY): 24 min  Charges:  $Gait Training: 23-37 mins                    G CodesShary Decamp [place] 08/12/16, 11:51 AM Suanne Marker PT [phone]

## 2016-07-29 NOTE — Progress Notes (Signed)
Pharmacy Antibiotic Note  Peter Bruce is a 66 y.o. male admitted on 07/24/2016 with endocarditis.  Pharmacy has been consulted for gentamicin dosing. 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 He was readmited with CNS infact now with the suspicion from valve. He has been restarted on Rocephin. After the discussion with Dr. Megan Salon today, we are going to add synergy gent with the hope that his renal function can hold up for 2 weeks. If it gets worse on gent, we will dc and treat with Rocephin alone. We'll do daily bmet to assess scr. Would to shoot for the trough of <0.5-1. Will not do peaks.   Plan:  Gent 100mg  IV x1 then 80mg  IV q24 Trough in 3 days Daily Bmet Will adjust interval as needed   Height: 5\' 10"  (177.8 cm) Weight: 194 lb 14.2 oz (88.4 kg) IBW/kg (Calculated) : 73  Temp (24hrs), Avg:98.6 F (37 C), Min:97.9 F (36.6 C), Max:99.6 F (37.6 C)   Recent Labs Lab 07/23/16 0546 07/24/16 2008 07/24/16 2018 07/24/16 2020 07/25/16 0832 07/25/16 1400 07/26/16 0345 07/26/16 1245  WBC 18.1* 18.6*  --   --  19.3* 21.1* 18.6*  --   CREATININE 1.97* 1.66* 1.60*  --  1.38* 1.51*  --  1.17  LATICACIDVEN  --   --   --  1.35  --   --   --   --     Estimated Creatinine Clearance: 68.6 mL/min (by C-G formula based on SCr of 1.17 mg/dL).    Allergies  Allergen Reactions  . Amitriptyline Shortness Of Breath  . Serotonin Reuptake Inhibitors (Ssris) Nausea And Vomiting    Antimicrobials this admission: 3/12 zosyn >> 3/16 3/17 vanc >> 3/18 3/20 PO vanc>> 3/19 Rocephin>> 3/22 Gent (synergy)>>  Dose adjustments this admission:   Microbiology results: 3/17 BCx x2:  3/18 BCx x2: 3/18  MRSA PCR: neg  Onnie Boer, PharmD, BCPS, AAHIVP, CPP Infectious Disease Pharmacist Pager: [phone] 07/29/2016 2:34 PM

## 2016-07-30 LAB — CULTURE, BLOOD (ROUTINE X 2)
CULTURE: NO GROWTH
Culture: NO GROWTH

## 2016-08-02 ENCOUNTER — Encounter: Payer: Self-pay | Admitting: Internal Medicine

## 2016-08-02 ENCOUNTER — Non-Acute Institutional Stay (SKILLED_NURSING_FACILITY): Payer: Medicare Other | Admitting: Internal Medicine

## 2016-08-02 DIAGNOSIS — I058 Other rheumatic mitral valve diseases: Secondary | ICD-10-CM | POA: Diagnosis not present

## 2016-08-02 DIAGNOSIS — M5441 Lumbago with sciatica, right side: Secondary | ICD-10-CM

## 2016-08-02 DIAGNOSIS — G8929 Other chronic pain: Secondary | ICD-10-CM

## 2016-08-02 DIAGNOSIS — I639 Cerebral infarction, unspecified: Secondary | ICD-10-CM

## 2016-08-02 DIAGNOSIS — D72825 Bandemia: Secondary | ICD-10-CM

## 2016-08-02 DIAGNOSIS — K219 Gastro-esophageal reflux disease without esophagitis: Secondary | ICD-10-CM

## 2016-08-02 DIAGNOSIS — D696 Thrombocytopenia, unspecified: Secondary | ICD-10-CM

## 2016-08-02 DIAGNOSIS — R791 Abnormal coagulation profile: Secondary | ICD-10-CM | POA: Diagnosis not present

## 2016-08-02 DIAGNOSIS — E440 Moderate protein-calorie malnutrition: Secondary | ICD-10-CM

## 2016-08-02 DIAGNOSIS — C01 Malignant neoplasm of base of tongue: Secondary | ICD-10-CM

## 2016-08-02 DIAGNOSIS — A0472 Enterocolitis due to Clostridium difficile, not specified as recurrent: Secondary | ICD-10-CM

## 2016-08-02 DIAGNOSIS — I10 Essential (primary) hypertension: Secondary | ICD-10-CM

## 2016-08-02 DIAGNOSIS — R531 Weakness: Secondary | ICD-10-CM

## 2016-08-02 DIAGNOSIS — D638 Anemia in other chronic diseases classified elsewhere: Secondary | ICD-10-CM

## 2016-08-02 DIAGNOSIS — N182 Chronic kidney disease, stage 2 (mild): Secondary | ICD-10-CM

## 2016-08-02 DIAGNOSIS — R110 Nausea: Secondary | ICD-10-CM

## 2016-08-02 DIAGNOSIS — I480 Paroxysmal atrial fibrillation: Secondary | ICD-10-CM | POA: Diagnosis not present

## 2016-08-02 DIAGNOSIS — M5442 Lumbago with sciatica, left side: Secondary | ICD-10-CM

## 2016-08-02 DIAGNOSIS — I059 Rheumatic mitral valve disease, unspecified: Secondary | ICD-10-CM

## 2016-08-02 NOTE — Progress Notes (Signed)
LOCATION: [facility]  PCP: Adrian Prows, MD   Code Status: Full Code  Goals of care: Advanced Directive information Advanced Directives 07/26/2016  Does Patient Have a Medical Advance Directive? Yes  Type of Advance Directive [place]  Does patient want to make changes to medical advance directive? -  Copy of [place] in Chart? No - copy requested  Would patient like information on creating a medical advance directive? -       Extended Emergency Contact Information Primary Emergency Contact: Singleton-Sedlack,Diana Address: [address]          [place] [address] [place] Phone: [phone] Relation: Spouse Secondary Emergency Contact: Tannenbaum,Sigmond  [place] Phone: [phone] Relation: Friend   Allergies  Allergen Reactions  . Amitriptyline Shortness Of Breath  . Serotonin Reuptake Inhibitors (Ssris) Nausea And Vomiting    Chief Complaint  Patient presents with  . New Admit To SNF    New Admission Visit      HPI:  Patient is a 66 y.o. 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 He did not receive TPA due to it being on anticoagulation and recent history of strokes. He underwent transesophageal echocardiogram showing mitral valve vegetation. Blood cultures were negative for bacterial growth. Infectious disease was consulted and recommended IV ceftriaxone and IV gentamicin for treatment. He was also diagnosed with C. difficile colitis this hospital admission and was placed on oral vancomycin. He has medical history of Atrial fibrillation, dyslipidemia, coronary artery diseases, lupus  anticoagulant positive, pulmonary embolism, chronic diastolic congestive heart failure among others. 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 He was readmitted to the hospital with pneumonia from 07/19/2016-07/23/2016. He is seen in his room today.  Review of Systems:  Constitutional: Negative for fever, chills, diaphoresis. Feels weak and tired.  HENT: Negative for headache, congestion, nasal discharge, sore throat. Eyes: Negative for blurred vision, double vision and discharge.  Respiratory: Negative for cough, shortness of breath Cardiovascular: Negative for chest pain, palpitations Gastrointestinal: Negative for heartburn,vomiting, diarrhea and constipation. Positive for nausea and epigastric discomfort.  Genitourinary: Negative for dysuria.  Musculoskeletal: Negative for fall in the facility. Positive for chronic back pain.  Skin: Negative for itching, rash.  Neurological: Negative for dizziness. Psychiatric/Behavioral: Negative for depression.   Past Medical History:  Diagnosis Date  . Acute CHF (congestive heart failure) ([place]) 11/01/2015  . Acute on chronic diastolic heart failure ([place]) 09/27/2015  . Acute pulmonary embolus ([place]) 2010  . Acute respiratory failure with hypoxia ([place]) 09/09/2015  . Arthritis   . Atrial fibrillation [facility])    s/p inpatient DCCV 09/11/2015  . Atrial flutter ([place])   . CHF (congestive heart failure) ([place])   . Coronary artery disease involving native coronary artery 05/15/2015   multivessel  . Dysrhythmia    A-fib  . Essential hypertension   . Fibromyalgia   . History of kidney stones   . Hypercholesterolemia   . Hypertension   . Lupus anticoagulant positive 07/29/2008  . Mitral regurgitation 10/30/2015  . PMR (polymyalgia rheumatica) (H[facility])   . Pneumonia   .  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 S/P Maze operation for atrial fibrillation 05/29/2015   Complete bilateral atrial lesion set via median sternotomy using bipolar radiofrequency and cryothermy ablation with clipping of LA appendage  . 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 APPENDECTOMY    . arm surgery  Right    wrist and elbow  . CARDIAC CATHETERIZATION N/A 05/15/2015   Procedure: Left Heart Cath and Coronary Angiography;  Surgeon: Peter M Martinique, MD;  Location: [place] CV LAB;  Service: Cardiovascular;  Laterality: N/A;  . CARDIAC CATHETERIZATION N/A 11/17/2015   Procedure: Right/Left Heart Cath and Coronary/Graft Angiography;  Surgeon: Adrian Prows, MD;  Location: [place] CV LAB;  Service: Cardiovascular;  Laterality: N/A;  . CARDIOVERSION N/A 09/11/2015   Procedure: CARDIOVERSION;  Surgeon: Lelon Perla, MD;  Location: [facility] ENDOSCOPY;  Service: Cardiovascular;  Laterality: N/A;  . CHEST TUBE INSERTION  11/21/2015   Procedure: LEFT CHEST TUBE INSERTION;  Surgeon: Rexene Alberts, MD;  Location: [facility];  Service: Open Heart Surgery;;  . CLIPPING OF ATRIAL APPENDAGE  05/29/2015   Procedure: CLIPPING OF LEFT ATRIAL APPENDAGE;  Surgeon: Rexene Alberts, MD;  Location: [facility];  Service: Open Heart Surgery;;  45 AtriClip PRO 145  . CORONARY ARTERY BYPASS GRAFT N/A 05/29/2015   Procedure: CORONARY ARTERY BYPASS GRAFTING (CABG), ON PUMP, TIMES THREE, USING LEFT INTERNAL MAMMARY ARTERY, BILATERAL GREATER SAPHENOUS VEINS HARVESTED ENDOSCOPICALLY;  Surgeon: Rexene Alberts, MD;  Location: [facility];  Service: Open Heart Surgery;  Laterality: N/A;  . FRACTURE SURGERY     right wrist  . HERNIA REPAIR      . MAZE N/A 05/29/2015   Procedure: MAZE;  Surgeon: Rexene Alberts, MD;  Location: [facility];  Service: Open Heart Surgery;  Laterality: N/A;  Complete Bi-Atrial Lesion set with Ablation and Cryothermy  . MITRAL VALVE REPAIR Right 11/21/2015   Procedure: MINIMALLY INVASIVE REOPERATION FOR MITRAL VALVE REPAIR;  Surgeon: Rexene Alberts, MD;  Location: [facility];  Service: Open Heart Surgery;  Laterality: Right;  . PERIPHERAL VASCULAR CATHETERIZATION Bilateral 11/17/2015   Procedure: Renal Angiography;  Surgeon: Adrian Prows, MD;  Location: [facility];  Service: Cardiovascular;  Laterality: Bilateral;  . PERIPHERAL VASCULAR CATHETERIZATION N/A 11/17/2015   Procedure: Abdominal Aortogram;  Surgeon: Adrian Prows, MD;  Location: [facility] [facility];  Service: Cardiovascular;  Laterality: N/A;  . SHOULDER SURGERY Left    clavicular fracture  . TEE WITHOUT CARDIOVERSION N/A 05/29/2015   Procedure: TRANSESOPHAGEAL ECHOCARDIOGRAM (TEE);  Surgeon: Rexene Alberts, MD;  Location: [facility];  Service: Open Heart Surgery;  Laterality: N/A;  . TEE WITHOUT CARDIOVERSION N/A 10/30/2015   Procedure: TRANSESOPHAGEAL ECHOCARDIOGRAM (TEE);  Surgeon: Adrian Prows, MD;  Location: [facility];  Service: Cardiovascular;  Laterality: N/A;  . TEE WITHOUT CARDIOVERSION N/A 11/21/2015   Procedure: TRANSESOPHAGEAL ECHOCARDIOGRAM (TEE);  Surgeon: Rexene Alberts, MD;  Location: [facility];  Service: Open Heart Surgery;  Laterality: N/A;  . TEE WITHOUT CARDIOVERSION N/A 07/26/2016   Procedure: TRANSESOPHAGEAL ECHOCARDIOGRAM (TEE);  Surgeon: Adrian Prows, MD;  Location: [facility];  Service: Cardiovascular;  Laterality: N/A;   Social History:   reports that he has never smoked. He has never used smokeless tobacco. He reports that he does not drink alcohol or use drugs.  Family History  Problem Relation Age of Onset  . Cancer Mother   . Other Sister     chf  . Other Sister     Rosalee Kaufman    Medications: Allergies as of 08/02/2016       Reactions   Amitriptyline Shortness Of Breath   Serotonin Reuptake Inhibitors (ssris) Nausea And Vomiting      Medication List       Accurate as of 08/02/16 11:34 AM. Always use your most recent med list.          acetaminophen 325 MG tablet Commonly known as:  TYLENOL Take 2 tablets (650 mg total) by mouth every 6 (six) hours as needed for mild pain (or Fever >/= 101).   aspirin 325 MG tablet Take 1 tablet (325 mg total) by mouth daily.   cefTRIAXone 2 g in dextrose 5 % 50 mL Inject 2 g into the vein daily.   CERTAVITE/ANTIOXIDANTS Tabs Take 1 tablet by mouth daily.   furosemide 40 MG tablet Commonly known as:  LASIX Take 1 tablet (40 mg total) by mouth daily.   gentamicin 1.6-0.9 MG/ML-% Commonly known as:  GARAMYCIN Inject 50 mLs (80 mg total) into the vein daily.   metoprolol succinate 25 MG 24 hr tablet Commonly known as:  TOPROL-XL Take 1 tablet (25 mg total) by mouth daily.   ondansetron 8 MG disintegrating tablet Commonly known as:  ZOFRAN ODT Take 1 tablet (8 mg total) by mouth every 8 (eight) hours as needed for nausea or vomiting.   oxyCODONE-acetaminophen 5-325 MG tablet Commonly known as:  PERCOCET/ROXICET Take 1 tablet by mouth every 8 (eight) hours as needed for moderate pain.   potassium chloride SA 20 MEQ tablet Commonly known as:  K-DUR,KLOR-CON Take 1 tablet (20 mEq total) by mouth daily.   rosuvastatin 10 MG tablet Commonly known as:  CRESTOR Take 1 tablet (10 mg total) by mouth daily at 6 PM.   saccharomyces boulardii 250 MG capsule Commonly known as:  FLORASTOR Take 1 capsule (250 mg total) by mouth 2 (two) times daily.   sodium chloride flush 0.9 % Soln Commonly known as:  NS 10-40 mLs by Intracatheter route as needed (flush).   vancomycin 50 mg/mL oral solution Commonly known as:  VANCOCIN Take 2.5 mLs (125 mg total) by mouth every 6 (six) hours.       Immunizations: Immunization History  Administered Date(s) Administered    . Influenza Whole 03/05/2009  . Influenza,inj,Quad PF,36+ Mos 01/22/2013  . Influenza-Unspecified 03/17/2015  . PPD Test 07/29/2016  . Pneumococcal Conjugate-13 12/19/2013  . Pneumococcal Polysaccharide-23 07/08/2008  . Tdap 03/25/2010  . Zoster 04/21/2011     Physical Exam: Vitals:   08/02/16 1121  BP: 119/78  Pulse: 92  Resp: 18  Temp: 98 F (36.7 C)  TempSrc: Oral  SpO2: 95%  Weight: 196 lb (88.9 kg)  Height: _0  (1.778 m)   Body mass index is 28.12 kg/m.  General- elderly male, chronically ill appearing, in no acute distress Head- normocephalic, atraumatic Nose- no nasal discharge Throat- moist mucus membrane, normal oropharynx  Eyes- PERRLA, EOMI, no pallor, no icterus, no discharge, normal conjunctiva, normal sclera Neck- no cervical lymphadenopathy Cardiovascular- irregular heart rate, + murmur Respiratory- bilateral clear to auscultation, no wheeze, no rhonchi, no crackles, no  use of accessory muscles Abdomen- bowel sounds present, soft, non tender, no guarding or rigidity Musculoskeletal- able to move all 4 extremities, generalized weakness lower extremities > Upper extremities, 1+ leg edema Neurological- alert and oriented to self and place but not to time, dysarthria + Skin- warm and dry, PICC line to RUE Psychiatry- normal mood and affect    Labs reviewed: Basic Metabolic Panel:  Recent Labs  07/23/16 0130  07/24/16 2008  07/25/16 0832 07/25/16 1400 07/26/16 1245  NA 134*  < > 136  < > 137 134* 138  K 3.6  < > 4.2  < > 4.4 4.0 3.8  CL 102  < > 104  < > 106 105 109  CO2 25  < > 24  --  20* 21* 21*  GLUCOSE 113*  < > 124*  < > 92 109* 118*  BUN 41*  < > 36*  < > 33* 29* 27*  CREATININE 2.17*  < > 1.66*  < > 1.38* 1.51* 1.17  CALCIUM 8.1*  < > 8.0*  --  8.2* 8.2* 8.2*  MG 2.2  --  2.0  --  2.0  --   --   < > = values in this interval not displayed. Liver Function Tests:  Recent Labs  07/24/16 2008 07/25/16 0832 07/25/16 1400  AST 44*  48* 44*  ALT 94* 84* 84*  ALKPHOS 46 47 52  BILITOT 1.0 0.9 1.1  PROT 5.2* 5.1* 5.2*  ALBUMIN 2.4* 2.5* 2.5*   No results for input(s): LIPASE, AMYLASE in the last 8760 hours.  Recent Labs  06/12/16 1118 07/24/16 2249  AMMONIA <9* 10   CBC:  Recent Labs  07/23/16 0546 07/24/16 2008  07/25/16 0832 07/25/16 1400 07/26/16 0345  WBC 18.1* 18.6*  --  19.3* 21.1* 18.6*  NEUTROABS 14.6* 15.7*  --   --  17.5*  --   HGB 9.5* 9.6*  < > 9.8* 10.1* 9.8*  HCT 29.6* 30.0*  < > 30.9* 31.7* 31.1*  MCV 94.0 95.5  --  95.1 95.2 95.1  PLT 68* 72*  --  96* 95* 110*  < > = values in this interval not displayed. Cardiac Enzymes:  Recent Labs  07/25/16 0742 07/25/16 1400 07/25/16 1936  TROPONINI 0.11* 0.08* 0.07*   BNP: Invalid input(s): POCBNP CBG:  Recent Labs  06/11/16 1011 06/14/16 0845 07/06/16 1025  GLUCAP 86 147* 70    Radiological Exams: Dg Chest 2 View  Result Date: 07/19/2016 CLINICAL DATA:  Weakness and fatigue EXAM: CHEST  2 VIEW COMPARISON:  June 11, 2016 FINDINGS: There is airspace consolidation in the right lower lobe with right pleural effusion. The left lung is clear. Heart is mildly enlarged with pulmonary venous hypertension. No adenopathy. Central catheter tip is in superior vena cava. Patient is status post coronary artery bypass grafting. There is a left atrial appendage clamp. There is degenerative change in the lower thoracic region. There is a small benign appearing cystic area in the right proximal humerus. IMPRESSION: Right base consolidation consistent with pneumonia with right pleural effusion. Left lung clear. There is pulmonary vascular congestion. No interstitial edema. Central catheter tip in superior vena cava. No evident pneumothorax. Electronically Signed   By: Lowella Grip III M.D.   On: 07/19/2016 10:23   Ct Head Wo Contrast  Result Date: 07/27/2016 CLINICAL DATA:  66 year old male with stroke. History of cancer and hypertension. EXAM:  CT HEAD WITHOUT CONTRAST TECHNIQUE: Contiguous axial images were obtained from the  base of the skull through the vertex without intravenous contrast. COMPARISON:  Head CT dated 07/25/2016 FINDINGS: Brain: There is mild dilatation of the ventricles and sulci consistent with age-related atrophy. Moderate periventricular and deep white matter chronic microvascular ischemic changes noted. An area of hypodensity involving the left cerebellar hemisphere consistent with left PICA territory infarct as seen on the prior CT. Small right cerebellar hemisphere old focal infarct is again noted. There is a small old right lentiform nucleus lacunar infarct. Stable area of old infarct and encephalomalacia involving the left temporal lobe. There is no acute intracranial hemorrhage. There is no mass effect or midline shift. No extra-axial fluid collection. Vascular: No hyperdense vessel or unexpected calcification. Skull: Normal. Negative for fracture or focal lesion. Sinuses/Orbits: The visualized paranasal sinuses are clear. There is partial opacification of the right mastoid air cells similar to prior CT. The left mastoid air cells are clear. No air-fluid levels. Other: None IMPRESSION: 1. No acute intracranial hemorrhage. 2. Moderate age-related atrophy and chronic microvascular ischemic changes. Stable area of infarct in the inferior left cerebellar hemisphere in the distribution of the PICA as seen on the prior CT. Electronically Signed   By: Anner Crete M.D.   On: 07/27/2016 07:01   Ct Head Wo Contrast  Result Date: 07/25/2016 CLINICAL DATA:  Followup stroke. Acute left PICA distribution. Other older embolic infarctions. EXAM: CT HEAD WITHOUT CONTRAST TECHNIQUE: Contiguous axial images were obtained from the base of the skull through the vertex without intravenous contrast. COMPARISON:  MRI same day.  CT yesterday. FINDINGS: Brain: Generalized atrophy again demonstrated. Low-density within the inferior cerebellum on  the left appears the same, consistent with acute left PICA infarction. No evidence of hemorrhage. No increased swelling. No fourth ventricular compromise. Chronic small-vessel ischemic changes of the white matter remain evident. No hydrocephalus. No extra-axial collection. Vascular: There is atherosclerotic calcification of the major vessels at the base of the brain. Skull: Negative Sinuses/Orbits: Clear/normal Other: None IMPRESSION: No change since yesterday. Acute left PICA infarction with low-density in the inferior cerebellum on the left. No hemorrhage. No increased swelling. No fourth ventricular compromise. Electronically Signed   By: Nelson Chimes M.D.   On: 07/25/2016 11:55   Ct Head Wo Contrast  Result Date: 07/24/2016 CLINICAL DATA:  Altered mental status. Patient woke up screening in then was unresponsive. Left the hospital yesterday with diagnosis of Clostridium difficile. EXAM: CT HEAD WITHOUT CONTRAST TECHNIQUE: Contiguous axial images were obtained from the base of the skull through the vertex without intravenous contrast. COMPARISON:  MRI brain 06/12/2016.  CT head 06/11/2016. FINDINGS: Brain: Focal low-attenuation areas demonstrated in the left anterior temporal lobe and in the left inferior cerebellar hemisphere, progressing since previous study, suggesting acute ischemic process. The left temporal changes were demonstrated on the previous MRI but the cerebellar changes appear to be new since that time. There is mild mass effect in the left posterior fossa with mild displacement and effacement of the fourth ventricle. 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 Diffuse cerebral atrophy. Ventricular dilatation consistent with central atrophy. Low-attenuation in the deep white matter consistent with small vessel ischemia. No midline shift. No abnormal extra-axial fluid collections. Gray-white matter junctions are distinct. Basal cisterns are not effaced. No acute intracranial hemorrhage. Vascular: Vascular  calcifications are present in the carotid siphons and vertebrobasilar arteries peer Skull: Calvarium appears intact. Sinuses/Orbits: Paranasal sinuses and mastoid air cells are clear. Other: None. IMPRESSION: 1. Focal low-attenuation in the left anterior temporal lobe consistent with progressing infarct in  the distribution of the left middle cerebral artery and as previously seen on MRI. 2. New area of low-attenuation in the left inferior cerebellar hemisphere with associated mass effect is consistent with acute infarct in the distribution of the left posterior inferior cerebral artery. This was not present on prior MRI, suggesting a new interval process. 3. Chronic atrophy and small vessel ischemic changes. No acute intracranial hemorrhage. Electronically Signed   By: Lucienne Capers M.D.   On: 07/24/2016 23:51   Mr Jodene Nam Head Wo Contrast  Result Date: 07/25/2016 CLINICAL DATA:  History of intracranial septic emboli. Altered mental status with speech disturbance. EXAM: MRI HEAD WITHOUT CONTRAST MRA HEAD WITHOUT CONTRAST TECHNIQUE: Multiplanar, multiecho pulse sequences of the brain and surrounding structures were obtained without intravenous contrast. Angiographic images of the head were obtained using MRA technique without contrast. COMPARISON:  Brain MRI 12/30/2016 FINDINGS: MRI HEAD FINDINGS Brain: There is diffusion restriction throughout the lower left cerebellum, in the distribution of the left posterior inferior cerebellar artery. There are multiple other areas of high signal on diffusion-weighted imaging in the posterior right temporal lobe and lateral left temporal lobe, as well as in the periatrial white matter of the right parietal lobe. ADC values at these locations are relatively normal, likely indicating that these are areas of subacute ischemia. There is hyperintense T2 weighted signal within the left cerebellum, left occipital lobe, left temporal lobe and within the periventricular white matter.  A No mass lesion or midline shift. No hydrocephalus or extra-axial fluid collection. The midline structures are normal. No age advanced or lobar predominant atrophy. Vascular: Major intracranial arterial and venous sinus flow voids are preserved. Two foci of punctate chronic microhemorrhage of the left basal ganglia. Skull and upper cervical spine: The visualized skull base, calvarium, upper cervical spine and extracranial soft tissues are normal. Sinuses/Orbits: No fluid levels or advanced mucosal thickening. No mastoid effusion. Normal orbits. MRA HEAD FINDINGS Intracranial internal carotid arteries: Normal. Anterior cerebral arteries: Congenitally absent left A1 segment. Otherwise normal. Middle cerebral arteries: Normal. Posterior communicating arteries: Absent bilaterally. Posterior cerebral arteries: Normal. Basilar artery: Normal. Vertebral arteries: Right-dominant. There is minimal flow related enhancement within the left vertebral artery. Superior cerebellar arteries: Normal. Anterior inferior cerebellar arteries: Normal. Posterior inferior cerebellar arteries: Normal on the right. Left PICA is not visualized and likely occluded. IMPRESSION: 1. 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 No associated acute hemorrhage or mass effect. 2. Scattered areas of DWI abnormality in the left temporal lobe, right temporal lobe and right parietal lobe are favored to be areas of subacute ischemia. The distribution is again suggestive of a central embolic process, though likely occurring at a different time from the current acute left PICA infarct. 3. 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 Electronically Signed   By: Ulyses Jarred M.D.   On: 07/25/2016 03:15   Mr Brain Wo Contrast  Result Date: 07/25/2016 CLINICAL DATA:  History of intracranial septic emboli. Altered mental status with speech disturbance. EXAM:  MRI HEAD WITHOUT CONTRAST MRA HEAD WITHOUT CONTRAST TECHNIQUE: Multiplanar, multiecho pulse sequences of the brain and surrounding structures were obtained without intravenous contrast. Angiographic images of the head were obtained using MRA technique without contrast. COMPARISON:  Brain MRI 12/30/2016 FINDINGS: MRI HEAD FINDINGS Brain: There is diffusion restriction throughout the lower left cerebellum, in the distribution of the left posterior inferior cerebellar artery. There are multiple other areas of high signal on  diffusion-weighted imaging in the posterior right temporal lobe and lateral left temporal lobe, as well as in the periatrial white matter of the right parietal lobe. ADC values at these locations are relatively normal, likely indicating that these are areas of subacute ischemia. There is hyperintense T2 weighted signal within the left cerebellum, left occipital lobe, left temporal lobe and within the periventricular white matter. A No mass lesion or midline shift. No hydrocephalus or extra-axial fluid collection. The midline structures are normal. No age advanced or lobar predominant atrophy. Vascular: Major intracranial arterial and venous sinus flow voids are preserved. Two foci of punctate chronic microhemorrhage of the left basal ganglia. Skull and upper cervical spine: The visualized skull base, calvarium, upper cervical spine and extracranial soft tissues are normal. Sinuses/Orbits: No fluid levels or advanced mucosal thickening. No mastoid effusion. Normal orbits. MRA HEAD FINDINGS Intracranial internal carotid arteries: Normal. Anterior cerebral arteries: Congenitally absent left A1 segment. Otherwise normal. Middle cerebral arteries: Normal. Posterior communicating arteries: Absent bilaterally. Posterior cerebral arteries: Normal. Basilar artery: Normal. Vertebral arteries: Right-dominant. There is minimal flow related enhancement within the left vertebral artery. Superior cerebellar  arteries: Normal. Anterior inferior cerebellar arteries: Normal. Posterior inferior cerebellar arteries: Normal on the right. Left PICA is not visualized and likely occluded. IMPRESSION: 1. 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 No associated acute hemorrhage or mass effect. 2. Scattered areas of DWI abnormality in the left temporal lobe, right temporal lobe and right parietal lobe are favored to be areas of subacute ischemia. The distribution is again suggestive of a central embolic process, though likely occurring at a different time from the current acute left PICA infarct. 3. 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 Electronically Signed   By: Ulyses Jarred M.D.   On: 07/25/2016 03:15   US Scrotum  Result Date: 07/20/2016 CLINICAL DATA:  One week history of scrotal pain EXAM: SCROTAL ULTRASOUND DOPPLER ULTRASOUND OF THE TESTICLES TECHNIQUE: Complete ultrasound examination of the testicles, epididymis, and other scrotal structures was performed. Color and spectral Doppler ultrasound were also utilized to evaluate blood flow to the testicles. COMPARISON:  None. FINDINGS: Right testicle Measurements: 5.2 x 4.0 x 3.7 cm. The testicular parenchyma is diffusely inhomogeneous with areas of relative hyperechoic and hypoechoic parenchyma. No well-defined mass seen. No hyperemia seen on the right. Left testicle Measurements: 4.9 x 3.8 x 3.8 cm. Testicular parenchyma is diffusely inhomogeneous with areas of relative hyperechoic and hypoechoic testicular parenchyma. Appearance is similar to the contralateral side. No well-defined mass or hyperemia seen on the left. Right epididymis:  Normal in size and appearance. Left epididymis:  Normal in size and appearance. Hydrocele:  Moderate hydroceles bilaterally. Varicocele: Small varicocele on the left. No varicocele on the right. Pulsed Doppler interrogation of both  testes demonstrates normal low resistance arterial and venous waveforms bilaterally. No scrotal abscess or scrotal wall thickening. IMPRESSION: Diffusely inhomogeneous testicular parenchyma bilaterally without well-defined mass on either side. No hyperemia. This appearance raises concern for infiltrative type lesions within each testis. 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 More infiltrative type lesions such is extraosseous multiple myeloma or lymphoma can involve both testes simultaneously and must be of concern. The lack of hyperemia makes bilateral orchitis seen less likely, although the inhomogeneous appearance of the testicular parenchyma could be due to infectious etiology. Moderate hydroceles bilaterally. No extratesticular mass.  No torsion on either side. These results will be called to the ordering clinician  or representative by the Radiologist Assistant, and communication documented in the PACS or zVision Dashboard. Electronically Signed   By: Lowella Grip III M.D.   On: 07/20/2016 16:32   US Renal  Result Date: 07/20/2016 CLINICAL DATA:  Elevated creatinine and BUN, history of kidney stones, hypertension EXAM: RENAL / URINARY TRACT ULTRASOUND COMPLETE COMPARISON:  CT abdomen pelvis of 11/12/2015 FINDINGS: Right Kidney: Length: 11.4 cm. No hydronephrosis is seen. No renal calculi are evident. Left Kidney: Length: 11.2 cm. No hydronephrosis is seen. There is a nonobstructing calculus in the mid lower kidney of 7 mm in diameter. Also, there is a rounded structure which is hypoechogenic with some internal echoes. No blood flow is demonstrated and there is some through transmission. This probably represents a complex cyst, but further assessment with CT or preferably MRI would be recommended. Bladder: The urinary bladder is unremarkable other than slight prominence of the prostate indenting the posterior urinary bladder. IMPRESSION: 1. Nonobstructing left renal calculus of 7 mm in  diameter. 2. Probable complex cyst in the mid left kidney but further assessment with CT or preferably MRI would be recommended. 3. Prominent prostate. Electronically Signed   By: Ivar Drape M.D.   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 EXAM: NUCLEAR MEDICINE PET SKULL BASE TO THIGH TECHNIQUE: 13.1 mCi F-18 FDG was injected intravenously. Full-ring PET imaging was performed from the skull base to thigh after the radiotracer. CT data was obtained and used for attenuation correction and anatomic localization. FASTING BLOOD GLUCOSE:  Value: 70 mg/dl COMPARISON:  None. FINDINGS: NECK Along the right posterior tongue base and extending into the pharyngoepiglottic fold and epiglottis on the right side, and also into the glossoepiglottic fold and crossing the midline, there is a ill-defined mass lesion with maximum standard uptake value 12.4. This extends along the upper margin of the hyoid bone but is not obviously destroying the hyoid bone. Along the anterior margin the right sternocleidomastoid muscle there is an ill-defined focus of hypermetabolic activity corresponding to a station IIa node of approximately 1.1 cm in short axis on image 31/3, maximum SUV 12.4. A larger low-density node above this in station IIa measures 2.1 cm in short axis on image 25/3 but has only low-grade metabolic activity with maximum SUV of about 5.0. A left-sided station IIa lymph node measuring 1.2 cm in short axis on image 36/3 has a maximum standard uptake value of 6.2. This is right about at the level of the hyoid. A left level III lymph node measuring 1.4 cm on image 44/3 has a maximum SUV of 5.9. CHEST Faintly accentuated metabolic activity along coronary stent, thought to be incidental. No hypermetabolic adenopathy in the chest. Mild cardiomegaly noted. Prosthetic mitral valve. Prior CABG.  Coronary, aortic arch, and branch vessel atherosclerotic vascular disease. Moderate right and small left pleural effusion with associated passive atelectasis. There are scattered mildly prominent lymph nodes observed in the mediastinum with regard to size, including a 1.5 cm right lower paratracheal node on image 82 of series 3. These have relatively low standard uptake value, this paratracheal node has a maximum SUV of only 2.3. Background mediastinal blood pool activity has an SUV of 2.6. There appears to be a small loculated component of the right pleural effusion extending into the space between the right pectoralis major and minor in the third intercostal space. Numbering of  the intercostal spaces is tricky as the first and second ribs are fused along parts of their length bilaterally. I suspect this appearance could be due to a prior thoracotomy. ABDOMEN/PELVIS No abnormal hypermetabolic activity within the liver, pancreas, adrenal glands, or spleen. No hypermetabolic lymph nodes in the abdomen or pelvis. Cholelithiasis. Left mid kidney 7 mm nonobstructive renal calculus. There several punctate 2-3 mm right renal calculi. No hydronephrosis or hydroureter. Descending and sigmoid colon diverticulosis. Enlarged prostate gland indents the bladder base. SKELETON Small focus of hypermetabolic activity along the posterior margin of the L5 vertebra just to left of midline, conceivably this could be inflammatory or due to an active Schmorl's node, I suspect that there is an adjacent disc protrusion. Maximum SUV of this small focal lesion is 6.6. I am skeptical of a small metastatic lesion although strictly speaking a small metastatic lesion is not laterally excluded. There is however considerable lumbar spondylosis and some degree of scoliosis with anterolisthesis at the L4-5 level and suspected multilevel impingement. IMPRESSION: 1. Mass of the right tongue base extending into the pharyngoepiglottic fold, epiglottis,  and glossoepiglottic fold and crossing the midline, maximum SUV 12.4, compatible with malignancy. There is associated hypermetabolic bilateral station IIa and left station III adenopathy. 2. There is a small focus of hypermetabolic activity along the posterior inferior endplate of L5. I suspect that this is probably due to an active Schmorl's node given the adjacent suspected disc protrusion. Strictly speaking, a small bony metastatic lesion, while seemingly unlikely, is not readily excluded. Given the degree of lumbar spondylosis, scoliosis, and degenerative disc disease, lumbar spine MRI should be considered both to rule out osseous metastatic disease and also characterize the degree of impingement. 3. Other imaging findings of potential clinical significance: Mild cardiomegaly. Coronary, aortic arch, and branch vessel atherosclerotic vascular disease. Prior CABG and prosthetic mitral valve. Bilateral pleural effusions. Small loculated component of the right pleural effusion extends and the right third intercostal space in between the pectoralis muscles, possibly related to a prior thoracotomy. Cholelithiasis. Bilateral nonobstructive nephrolithiasis. Enlarged prostate gland. Descending and sigmoid colon diverticulosis. Electronically Signed   By: Van Clines M.D.   On: 07/06/2016 14:46   [place] Art/ven Flow Abd Pelv Doppler  Result Date: 07/20/2016 CLINICAL DATA:  One week history of scrotal pain EXAM: SCROTAL ULTRASOUND DOPPLER ULTRASOUND OF THE TESTICLES TECHNIQUE: Complete ultrasound examination of the testicles, epididymis, and other scrotal structures was performed. Color and spectral Doppler ultrasound were also utilized to evaluate blood flow to the testicles. COMPARISON:  None. FINDINGS: Right testicle Measurements: 5.2 x 4.0 x 3.7 cm. The testicular parenchyma is diffusely inhomogeneous with areas of relative hyperechoic and hypoechoic parenchyma. No well-defined mass seen. No hyperemia seen on  the right. Left testicle Measurements: 4.9 x 3.8 x 3.8 cm. Testicular parenchyma is diffusely inhomogeneous with areas of relative hyperechoic and hypoechoic testicular parenchyma. Appearance is similar to the contralateral side. No well-defined mass or hyperemia seen on the left. Right epididymis:  Normal in size and appearance. Left epididymis:  Normal in size and appearance. Hydrocele:  Moderate hydroceles bilaterally. Varicocele: Small varicocele on the left. No varicocele on the right. Pulsed Doppler interrogation of both testes demonstrates normal low resistance arterial and venous waveforms bilaterally. No scrotal abscess or scrotal wall thickening. IMPRESSION: Diffusely inhomogeneous testicular parenchyma bilaterally without well-defined mass on either side. No hyperemia. This appearance raises concern for infiltrative type lesions within each testis. 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 More infiltrative type lesions such is extraosseous  multiple myeloma or lymphoma can involve both testes simultaneously and must be of concern. The lack of hyperemia makes bilateral orchitis seen less likely, although the inhomogeneous appearance of the testicular parenchyma could be due to infectious etiology. Moderate hydroceles bilaterally. No extratesticular mass.  No torsion on either side. These results will be called to the ordering clinician or representative by the Radiologist Assistant, and communication documented in the PACS or zVision Dashboard. Electronically Signed   By: Lowella Grip III M.D.   On: 07/20/2016 16:32   Dg Chest Portable 1 View  Result Date: 07/24/2016 CLINICAL DATA:  65 year old male with altered mental status EXAM: PORTABLE CHEST 1 VIEW COMPARISON:  Chest radiograph dated 07/19/2016 FINDINGS: There has been interval removal of the right-sided PICC. There is stable cardiomegaly. Mechanical cardiac valve and left atrial appendage occlusion device noted. There is mild vascular  congestion. There has been significant interval improvement of the previously seen right pleural effusion and right lung base airspace densities. Small amount of pleural effusion and associated atelectasis versus infiltrate remains. The left lung is clear. There is no pneumothorax. Median sternotomy wires noted. No acute osseous pathology. IMPRESSION: 1. Interval improvement of the previously seen right-sided pleural effusion and right lung base airspace densities with minimal amount of residual pleural effusion and associated right lung base atelectasis/infiltrate. 2. Stable cardiomegaly with probable mild vascular congestion. No pulmonary edema. Electronically Signed   By: Anner Crete M.D.   On: 07/24/2016 20:23   Dg Swallowing Func-speech Pathology  Result Date: 07/25/2016 Objective Swallowing Evaluation: Type of Study: MBS-Modified Barium Swallow Study Patient Details Name: Gedeon Brandow MRN: [id number] Date of Birth: [birth year] Today's Date: 07/25/2016 Time: SLP Start Time (ACUTE ONLY): 1530-SLP Stop Time (ACUTE ONLY): 1550 SLP Time Calculation (min) (ACUTE ONLY): 20 min Past Medical History: Past Medical History: Diagnosis Date . Acute CHF (congestive heart failure) ([place]) 11/01/2015 . Acute on chronic diastolic heart failure ([place]) 09/27/2015 . Acute pulmonary embolus ([place]) 2010 . Acute respiratory failure with hypoxia ([place]) 09/09/2015 . Arthritis  . Atrial fibrillation [facility])   s/p inpatient DCCV 09/11/2015 . Atrial flutter ([place])  . CHF (congestive heart failure) ([place])  . Coronary artery disease involving native coronary artery 05/15/2015  multivessel . Dysrhythmia   A-fib . Essential hypertension  . Fibromyalgia  . History of kidney stones  . Hypercholesterolemia  . Hypertension  . Lupus anticoagulant positive 07/29/2008 . Mitral regurgitation 10/30/2015 . PMR (polymyalgia rheumatica) ([facility])  . Pneumonia  . 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 S/P  Maze operation for atrial fibrillation 05/29/2015  Complete bilateral atrial lesion set via median sternotomy using bipolar radiofrequency and cryothermy ablation with clipping of LA appendage . S/P minimally invasive mitral valve repair 11/21/2015  Complex valvuloplasty including artificial Gore-tex neochord placement x12 with 32 mm Sorin Memo 3D ring annuloplasty via right mini thoracotomy approach . 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 APPENDECTOMY   . arm surgery  Right   wrist and elbow . CARDIAC CATHETERIZATION N/A 05/15/2015  Procedure: Left Heart Cath and Coronary Angiography;  Surgeon: Peter M Martinique, MD;  Location: [place] [facility];  Service: Cardiovascular;  Laterality: N/A; . CARDIAC CATHETERIZATION N/A 11/17/2015  Procedure: Right/Left Heart Cath and Coronary/Graft Angiography;  Surgeon: Adrian Prows, MD;  Location: [place] [facility];  Service: Cardiovascular;  Laterality: N/A; .  CARDIOVERSION N/A 09/11/2015  Procedure: CARDIOVERSION;  Surgeon: Lelon Perla, MD;  Location: [facility];  Service: Cardiovascular;  Laterality: N/A; . CHEST TUBE INSERTION  11/21/2015  Procedure: LEFT CHEST TUBE INSERTION;  Surgeon: Rexene Alberts, MD;  Location: [place];  Service: Open Heart Surgery;; . CLIPPING OF ATRIAL APPENDAGE  05/29/2015  Procedure: CLIPPING OF LEFT ATRIAL APPENDAGE;  Surgeon: Rexene Alberts, MD;  Location: [place];  Service: Open Heart Surgery;;  45 AtriClip PRO 145 . CORONARY ARTERY BYPASS GRAFT N/A 05/29/2015  Procedure: CORONARY ARTERY BYPASS GRAFTING (CABG), ON PUMP, TIMES THREE, USING LEFT INTERNAL MAMMARY ARTERY, BILATERAL GREATER SAPHENOUS VEINS HARVESTED ENDOSCOPICALLY;  Surgeon: Rexene Alberts, MD;  Location: [place];  Service: Open Heart Surgery;  Laterality: N/A; . FRACTURE SURGERY    right wrist . HERNIA REPAIR   . MAZE N/A 05/29/2015  Procedure: MAZE;  Surgeon: Rexene Alberts, MD;  Location: [place];  Service: Open  Heart Surgery;  Laterality: N/A;  Complete Bi-Atrial Lesion set with Ablation and Cryothermy . MITRAL VALVE REPAIR Right 11/21/2015  Procedure: MINIMALLY INVASIVE REOPERATION FOR MITRAL VALVE REPAIR;  Surgeon: Rexene Alberts, MD;  Location: [place];  Service: Open Heart Surgery;  Laterality: Right; . PERIPHERAL VASCULAR CATHETERIZATION Bilateral 11/17/2015  Procedure: Renal Angiography;  Surgeon: Adrian Prows, MD;  Location: [place] [facility];  Service: Cardiovascular;  Laterality: Bilateral; . PERIPHERAL VASCULAR CATHETERIZATION N/A 11/17/2015  Procedure: Abdominal Aortogram;  Surgeon: Adrian Prows, MD;  Location: [place] [facility];  Service: Cardiovascular;  Laterality: N/A; . SHOULDER SURGERY Left   clavicular fracture . TEE WITHOUT CARDIOVERSION N/A 05/29/2015  Procedure: TRANSESOPHAGEAL ECHOCARDIOGRAM (TEE);  Surgeon: Rexene Alberts, MD;  Location: [place];  Service: Open Heart Surgery;  Laterality: N/A; . TEE WITHOUT CARDIOVERSION N/A 10/30/2015  Procedure: TRANSESOPHAGEAL ECHOCARDIOGRAM (TEE);  Surgeon: Adrian Prows, MD;  Location: [place];  Service: Cardiovascular;  Laterality: N/A; . 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 Presented with episode of unresponsiveness. HEad CT shows new CVA (Occlusion of the left posterior inferior cerebellar artery with associated acute infarct of the inferior left cerebellar hemisphere, Scattered areas of DWI abnormality in the left temporal lobe, right temporal lobe and right parietal  lobe are favored to be areas of subacute ischemia). Recently discharged from [facility] where he was admitted with generalized weakness and found to have NCAP with acute hypoxia nad respiratory failure. Admission CXR: Interval improvement of the previously seen right-sided pleural effusion and right lung base airspace densities with minimal amount of residual pleural effusion and associated right lung base atelectasis/infiltrate. Note that bedside swallow evaluation complete during 06/2016 admission with noted s/s of aspiration, recommendations for dysphagia 3, thin liquids and MBS however at the time patient declined MBS.  Subjective: "i can chew anything." Assessment / Plan / Recommendation CHL IP CLINICAL IMPRESSIONS 07/25/2016 Clinical Impression Patient presents with moderate, multifactorial pharyngeal dysphagia in the setting of acute CVA and recently diagnosed base of tongue cancer. Patient with anatomical changes (suspect 2/2 BOT cancer) and reduced hyolarygneal excursion which impact airway protection and bolus clearance  through the UES. This frequently resulted in shallow penetration during the swallow with all liquid consistencies. Patient also has sensory deficits leading to premature spillage with thin and nectar thick liquids when taken in consecutive sips with both sensed and silent aspiration during the swallow. Mild-moderate residue remained in the pyriform sinuses and cp segment after the swallow with all consistencies (thin, nectar and honey-thick liquids) and solids. This residue occasionally spilled into the larygneal vestibule after the swallow. Pt with difficulty following commands for swallow maneuvers or even to take single sips of liquid. Responded well to very basic commands "Sip, swallow, cough, swallow," but suspect he will have difficulty with carryover of this strategy without supervision. Patient was consistently able to clear penetrate with the above cues, and subsequent  swallow is noted to reduce residue. Spoke with patient regarding risks of aspiration, which is minimized with use of strategies. He expresses a desire to continue eating and drinking. Recommend dys 3 diet with thin liquid via single cup sip only, with use of hard cough, reswallow after each sip, bite. Will follow up with patient and his wife for education and training in compensatory strategies, diet tolerance. SLP Visit Diagnosis Dysphagia, pharyngeal phase (R13.13) Attention and concentration deficit following -- Frontal lobe and executive function deficit following -- Impact on safety and function Severe aspiration risk;Risk for inadequate nutrition/hydration;Moderate aspiration risk   CHL IP TREATMENT RECOMMENDATION 07/25/2016 Treatment Recommendations Therapy as outlined in treatment plan below   Prognosis 07/25/2016 Prognosis for Safe Diet Advancement Fair Barriers to Reach Goals Other (Comment) Barriers/Prognosis Comment (No Data) CHL IP DIET RECOMMENDATION 07/25/2016 SLP Diet Recommendations Dysphagia 3 (Mech soft) solids;Thin liquid Liquid Administration via Cup Medication Administration Whole meds with puree Compensations Minimize environmental distractions;Slow rate;Small sips/bites;Hard cough after swallow;Multiple dry swallows after each bite/sip Postural Changes Seated upright at 90 degrees   CHL IP OTHER RECOMMENDATIONS 07/25/2016 Recommended Consults -- Oral Care Recommendations Oral care before and after PO Other Recommendations --   CHL IP FOLLOW UP RECOMMENDATIONS 07/25/2016 Follow up Recommendations Outpatient SLP   CHL IP FREQUENCY AND DURATION 07/25/2016 Speech Therapy Frequency (ACUTE ONLY) min 2x/week Treatment Duration 2 weeks      CHL IP ORAL PHASE 07/25/2016 Oral Phase WFL Oral - Pudding Teaspoon -- Oral - Pudding Cup -- Oral - Honey Teaspoon -- Oral - Honey Cup -- Oral - Nectar Teaspoon -- Oral - Nectar Cup -- Oral - Nectar Straw -- Oral - Thin Teaspoon -- Oral - Thin Cup -- Oral - Thin Straw --  Oral - Puree -- Oral - Mech Soft -- Oral - Regular -- Oral - Multi-Consistency -- Oral - Pill -- Oral Phase - Comment --  CHL IP PHARYNGEAL PHASE 07/25/2016 Pharyngeal Phase Impaired Pharyngeal- Pudding Teaspoon -- Pharyngeal -- Pharyngeal- Pudding Cup -- Pharyngeal -- Pharyngeal- Honey Teaspoon -- Pharyngeal -- Pharyngeal- Honey Cup Reduced epiglottic inversion;Reduced airway/laryngeal closure;Penetration/Aspiration during swallow;Penetration/Apiration after swallow;Pharyngeal residue - pyriform;Pharyngeal residue - cp segment;Reduced laryngeal elevation;Reduced anterior laryngeal mobility Pharyngeal Material enters airway, remains ABOVE vocal cords and not ejected out Pharyngeal- Nectar Teaspoon -- Pharyngeal -- Pharyngeal- Nectar Cup Reduced epiglottic inversion;Reduced airway/laryngeal closure;Reduced laryngeal elevation;Reduced anterior laryngeal mobility;Penetration/Aspiration during swallow;Penetration/Apiration after swallow;Trace aspiration;Pharyngeal residue - cp segment;Pharyngeal residue - pyriform Pharyngeal Material enters airway, passes BELOW cords without attempt by patient to eject out (silent aspiration) Pharyngeal- Nectar Straw -- Pharyngeal -- Pharyngeal- Thin Teaspoon Reduced anterior laryngeal mobility;Reduced epiglottic inversion;Reduced laryngeal elevation;Reduced airway/laryngeal closure;Penetration/Aspiration during swallow;Penetration/Apiration after swallow;Moderate aspiration;Pharyngeal residue - pyriform;Pharyngeal residue - cp segment  Pharyngeal Material enters airway, remains ABOVE vocal cords and not ejected out Pharyngeal- Thin Cup Reduced epiglottic inversion;Reduced anterior laryngeal mobility;Reduced laryngeal elevation;Reduced airway/laryngeal closure;Moderate aspiration;Penetration/Aspiration during swallow;Penetration/Apiration after swallow;Pharyngeal residue - pyriform;Pharyngeal residue - cp segment Pharyngeal Material enters airway, passes BELOW cords and not ejected out  despite cough attempt by patient Pharyngeal- Thin Straw -- Pharyngeal -- Pharyngeal- Puree Reduced epiglottic inversion;Reduced anterior laryngeal mobility;Reduced laryngeal elevation;Reduced airway/laryngeal closure;Penetration/Apiration after swallow;Pharyngeal residue - pyriform;Pharyngeal residue - cp segment Pharyngeal Material enters airway, remains ABOVE vocal cords and not ejected out Pharyngeal- Mechanical Soft Reduced epiglottic inversion;Reduced anterior laryngeal mobility;Reduced laryngeal elevation;Reduced airway/laryngeal closure;Penetration/Apiration after swallow;Pharyngeal residue - pyriform;Pharyngeal residue - cp segment Pharyngeal Material enters airway, remains ABOVE vocal cords and not ejected out Pharyngeal- Regular Reduced epiglottic inversion;Reduced anterior laryngeal mobility;Reduced laryngeal elevation;Reduced airway/laryngeal closure;Penetration/Apiration after swallow;Pharyngeal residue - pyriform;Pharyngeal residue - cp segment Pharyngeal Material enters airway, remains ABOVE vocal cords and not ejected out Pharyngeal- Multi-consistency -- Pharyngeal -- Pharyngeal- Pill WFL Pharyngeal -- Pharyngeal Comment --  CHL IP CERVICAL ESOPHAGEAL PHASE 07/25/2016 Cervical Esophageal Phase Impaired Pudding Teaspoon -- Pudding Cup -- Honey Teaspoon -- Honey Cup -- Nectar Teaspoon -- Nectar Cup -- Nectar Straw -- Thin Teaspoon -- Thin Cup -- Thin Straw -- Puree -- Mechanical Soft -- Regular -- Multi-consistency -- Pill -- Cervical Esophageal Comment reduced duration and amplitude of opening 2/2 impaired hyolaryngeal excursion Deneise Lever, MS [facility]-SL[facility] Speech-Language Pathologist [phone] No flowsheet data found. Aliene Altes 07/25/2016, 6:36 PM               Assessment/Plan  Generalized weakness With physical deconditioning. Will have him work with physical therapy and occupational therapy team to help with gait training and muscle strengthening exercises.fall precautions. Skin care.  Encourage to be out of bed.   Acute left cerebellar stroke Embolic stroke in the setting of mitral valve endocarditis and atrial fibrillation. Patient has been seen by neurosurgery in the hospital was no surgical intervention for now. Discontinue aspirin with INR being supratherapeutic. Coumadin on hold for now see below. Will have patient work with PT/OT as tolerated to regain strength and restore function.  Fall precautions are in place. SLP consult. Neurology follow up.   Supratherapeutic INR Currently on aspirin 325 mg daily to bridge for therapeutic INR. Coumadin on hold with INR 5.5 yesterday and 4.8 today. Discontinue aspirin. Continue Crestor 10 mg daily. Continue to hold Coumadin. 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 Patient was seen by infectious disease in the hospital. Plan is for patient to continue ceftriaxone until 09/06/2016 and gentamicin until 08/12/2016 for now via PICC line. Continue PICC line care. Gentamicin level to be monitored by pharmacy and adjust dose accordingly. Make ID f/u  C. difficile colitis no further diarrhea reported by patient this visit. Currently on vancomycin 125 mg 4 times a day. We'll continue this antibiotic until completion of antibiotic for infective endocarditis and provide additional one week's coverage after completion of antibiotic. Monitor clinically and continue florastor. ID follow up  Nausea With epigastric pain. Concern for gastroesophageal reflux disease contribution to this with patient being on multiple antibiotic and severe deconditioning. Continue Zofran 8 mg every 8 hours as needed for nausea or vomiting and monitor clinically.  GERD Start patient on omeprazole 20 mg daily and monitor.  Atrial fibrillation Controlled heart rate. Continue metoprolol succinate 25 mg daily.  Leukocytosis Currently on antibiotic treatment for mitral valve endocarditis. Monitor WBC and  temperature curve.  Anemia  of chronic disease Monitor CBC given patient being on anticoagulation and with supratherapeutic INR. No bleed reported at present  Thrombocytopenia No bleed reported. Monitor platelet count.  Protein calorie malnutrition RD consult. Check pre-albumin level. Continue multivitamins with ferrous fumarate and folic acid supplement.  Essential hypertension Monitor blood pressure reading given his hypotensive episodes in the hospital. Continue metoprolol succinate 25 mg daily and furosemide 40 mg daily with potassium supplement. Check BMP.  Chronic back pain Continue Percocet 5-3 25 mg every 8 hours as needed for pain along with Tylenol. Get PMR consult. Encouraged to be out of bed daily. Fall precautions.  ckd stage 2 monitor bmp  Malignant neoplasm of base of tongue To f/u with oncology for radiation therapy   Goals of care: short term rehabilitation   Labs/tests ordered: cbc, cmp, inr, prealbumin, gentamicin level 08/03/16  Family/ staff Communication: reviewed care plan with patient and nursing supervisor  I have spent greater than 50 minutes for this encounter which includes reviewing hospital records, addressing above mentioned concerns, reviewing care plan with patient, answering patient's concerns and counseling him.     Blanchie Serve, MD Internal Medicine [facility] [address] [place], [place] [address] Cell Phone (Monday-Friday 8 am - 5 pm): [phone] On Call: [phone] and follow prompts after 5 pm and on weekends Office Phone: [phone] Office Fax: [phone]

## 2016-08-03 ENCOUNTER — Encounter: Payer: Medicare Other | Admitting: Nutrition

## 2016-08-03 ENCOUNTER — Encounter: Payer: Medicare Other | Admitting: Physical Therapy

## 2016-08-03 ENCOUNTER — Ambulatory Visit: Payer: Medicare Other

## 2016-08-03 ENCOUNTER — Non-Acute Institutional Stay (SKILLED_NURSING_FACILITY): Payer: Medicare Other | Admitting: Family

## 2016-08-03 ENCOUNTER — Encounter: Payer: Self-pay | Admitting: Family

## 2016-08-03 DIAGNOSIS — E785 Hyperlipidemia, unspecified: Secondary | ICD-10-CM

## 2016-08-03 DIAGNOSIS — R1312 Dysphagia, oropharyngeal phase: Secondary | ICD-10-CM

## 2016-08-03 DIAGNOSIS — A498 Other bacterial infections of unspecified site: Secondary | ICD-10-CM

## 2016-08-03 DIAGNOSIS — T826XXD Infection and inflammatory reaction due to cardiac valve prosthesis, subsequent encounter: Secondary | ICD-10-CM

## 2016-08-03 DIAGNOSIS — I480 Paroxysmal atrial fibrillation: Secondary | ICD-10-CM | POA: Diagnosis not present

## 2016-08-03 DIAGNOSIS — I5032 Chronic diastolic (congestive) heart failure: Secondary | ICD-10-CM | POA: Diagnosis not present

## 2016-08-03 DIAGNOSIS — R2681 Unsteadiness on feet: Secondary | ICD-10-CM

## 2016-08-03 DIAGNOSIS — I38 Endocarditis, valve unspecified: Secondary | ICD-10-CM

## 2016-08-03 DIAGNOSIS — B9689 Other specified bacterial agents as the cause of diseases classified elsewhere: Secondary | ICD-10-CM | POA: Diagnosis not present

## 2016-08-03 DIAGNOSIS — I110 Hypertensive heart disease with heart failure: Secondary | ICD-10-CM | POA: Diagnosis not present

## 2016-08-03 DIAGNOSIS — G894 Chronic pain syndrome: Secondary | ICD-10-CM

## 2016-08-03 NOTE — Progress Notes (Signed)
Location:  [facility] Room Number: 4656 Place of Service:  SNF [id number])  Provider: Marlowe Sax FNP-C   PCP: Adrian Prows, MD Patient Care Team: Adrian Prows, MD as PCP - General (Cardiology) Wallene Huh, DPM as Consulting Physician (Podiatry) Carolan Clines, MD as Consulting Physician (Urology) Gavin Pound, MD as Consulting Physician (Rheumatology) Leta Baptist, MD as Consulting Physician (Otolaryngology) Heath Lark, MD as Consulting Physician (Hematology and Oncology)  Extended Emergency Contact Information Primary Emergency Contact: Singleton-Sawtell,Diana Address: [address]          [place] [address] [address] of [employer] Phone: [phone] Relation: Spouse Secondary Emergency Contact: Tannenbaum,Sigmond  [place] of [employer] Phone: [phone] Relation: Friend  Code Status: Full code  Goals of care:  Advanced Directive information Advanced Directives 08/03/2016  Does Patient Have a Medical Advance Directive? No  Type of Advance Directive -  Does patient want to make changes to medical advance directive? No - Patient declined  Copy of [place] in Chart? -  Would patient like information on creating a medical advance directive? -     Allergies  Allergen Reactions  . Amitriptyline Shortness Of Breath  . Serotonin Reuptake Inhibitors (Ssris) Nausea And Vomiting    Chief Complaint  Patient presents with  . Discharge Note    discharge home     HPI:  66 y.o. male seen today at [facility] for discharge home. 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 He did not receive TPA due to it being  on anticoagulation and recent history of strokes. He underwent transesophageal echocardiogram showing mitral valve vegetation. Blood cultures were negative for bacterial growth. Infectious disease was consulted and recommended IV ceftriaxone and IV gentamicin for treatment. He was also diagnosed with C. difficile colitis this hospital admission and was placed on oral vancomycin.  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 He was readmitted to the hospital with pneumonia from 07/19/2016-07/23/2016. He has a medical history of HTN, CHF, Afib,PE, CAD s/p CABG X 3, Dyslipidemia, Fibromyalgia, Lupus,PMR, Squamous cell cancer of buccal mucosa among other conditions. He is seen in his room today. He complains of nausea. Facility Nurse just administered antiemetic.He is currently on I.V ceftriaxone until 09/06/2016 and Gentamycin until 08/13/2016.Also on oral vancomycin for recent C-diff until 09/08/2016 or as recommended by infectious disease.   He has worked with PT/OT but request early discharge home.He is a high risk for hospital readmission.Recommend skill care but insist on discharge home.Patient's POA aware states has had [facility] service after previous hospital discharge.He will be discharged home with Home health PT/OT to continue with ROM, Exercise, Gait stability and muscle strengthening.He will need a HH Speech Therapy for dysphagia and dysarthria.He will also require a [facility] RN for I.V medication administration and INR check.Warfarin currently on hold last INR 4.2 Next INR check due 08/05/2016 goal 2-3. He will also require a [facility] to assist with ADL's. He does not  require any DME.Home health services will be arranged by facility social worker prior to discharge. Prescription medication will be written x 1 month  then patient to follow up with PCP in 1-2  weeks.Facility staff report no new concerns.  Past Medical History:  Diagnosis Date  . Acute CHF (congestive heart failure) ([place]) 11/01/2015  . Acute on chronic diastolic heart failure ([place]) 09/27/2015  . Acute pulmonary embolus ([place]) 2010  . Acute respiratory failure with hypoxia ([place]) 09/09/2015  . Arthritis   . Atrial fibrillation [facility])    s/p inpatient DCCV 09/11/2015  . Atrial flutter ([place])   . CHF (congestive heart failure) ([place])   . Coronary artery disease involving native coronary artery 05/15/2015   multivessel  . Dysrhythmia    A-fib  . Essential hypertension   . Fibromyalgia   . History of kidney stones   . Hypercholesterolemia   . Hypertension   . Lupus anticoagulant positive 07/29/2008  . Mitral regurgitation 10/30/2015  . PMR (polymyalgia rheumatica) ([facility])   . Pneumonia   . 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 S/P Maze operation for atrial fibrillation 05/29/2015   Complete bilateral atrial lesion set via median sternotomy using bipolar radiofrequency and cryothermy ablation with clipping of LA appendage  . 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 APPENDECTOMY    . arm surgery  Right    wrist and elbow  . CARDIAC CATHETERIZATION N/A 05/15/2015   Procedure: Left Heart Cath and Coronary Angiography;  Surgeon: Peter M Martinique, MD;  Location: [facility] CV LAB;  Service: Cardiovascular;  Laterality: N/A;  . CARDIAC CATHETERIZATION N/A 11/17/2015   Procedure: Right/Left Heart Cath and Coronary/Graft Angiography;  Surgeon: Adrian Prows, MD;  Location: [place] CV LAB;  Service: Cardiovascular;  Laterality: N/A;  . CARDIOVERSION N/A 09/11/2015    Procedure: CARDIOVERSION;  Surgeon: Lelon Perla, MD;  Location: [facility] ENDOSCOPY;  Service: Cardiovascular;  Laterality: N/A;  . CHEST TUBE INSERTION  11/21/2015   Procedure: LEFT CHEST TUBE INSERTION;  Surgeon: Rexene Alberts, MD;  Location: [facility];  Service: Open Heart Surgery;;  . CLIPPING OF ATRIAL APPENDAGE  05/29/2015   Procedure: CLIPPING OF LEFT ATRIAL APPENDAGE;  Surgeon: Rexene Alberts, MD;  Location: [facility];  Service: Open Heart Surgery;;  45 AtriClip PRO 145  . CORONARY ARTERY BYPASS GRAFT N/A 05/29/2015   Procedure: CORONARY ARTERY BYPASS GRAFTING (CABG), ON PUMP, TIMES THREE, USING LEFT INTERNAL MAMMARY ARTERY, BILATERAL GREATER SAPHENOUS VEINS HARVESTED ENDOSCOPICALLY;  Surgeon: Rexene Alberts, MD;  Location: [facility];  Service: Open Heart Surgery;  Laterality: N/A;  . FRACTURE SURGERY     right wrist  . HERNIA REPAIR    . MAZE N/A 05/29/2015   Procedure: MAZE;  Surgeon: Rexene Alberts, MD;  Location: [facility];  Service: Open Heart Surgery;  Laterality: N/A;  Complete Bi-Atrial Lesion set with Ablation and Cryothermy  . MITRAL VALVE REPAIR Right 11/21/2015   Procedure: MINIMALLY INVASIVE REOPERATION FOR MITRAL VALVE REPAIR;  Surgeon: Rexene Alberts, MD;  Location: [facility];  Service: Open Heart Surgery;  Laterality: Right;  . PERIPHERAL VASCULAR CATHETERIZATION Bilateral 11/17/2015   Procedure: Renal Angiography;  Surgeon: Adrian Prows, MD;  Location: [facility] CV LAB;  Service: Cardiovascular;  Laterality: Bilateral;  .  PERIPHERAL VASCULAR CATHETERIZATION N/A 11/17/2015   Procedure: Abdominal Aortogram;  Surgeon: Adrian Prows, MD;  Location: [facility] CV LAB;  Service: Cardiovascular;  Laterality: N/A;  . SHOULDER SURGERY Left    clavicular fracture  . TEE WITHOUT CARDIOVERSION N/A 05/29/2015   Procedure: TRANSESOPHAGEAL ECHOCARDIOGRAM (TEE);  Surgeon: Rexene Alberts, MD;  Location: [facility];  Service: Open Heart Surgery;  Laterality: N/A;  . TEE WITHOUT CARDIOVERSION N/A 10/30/2015    Procedure: TRANSESOPHAGEAL ECHOCARDIOGRAM (TEE);  Surgeon: Adrian Prows, MD;  Location: [facility];  Service: Cardiovascular;  Laterality: N/A;  . TEE WITHOUT CARDIOVERSION N/A 11/21/2015   Procedure: TRANSESOPHAGEAL ECHOCARDIOGRAM (TEE);  Surgeon: Rexene Alberts, MD;  Location: [place];  Service: Open Heart Surgery;  Laterality: N/A;  . TEE WITHOUT CARDIOVERSION N/A 07/26/2016   Procedure: TRANSESOPHAGEAL ECHOCARDIOGRAM (TEE);  Surgeon: Adrian Prows, MD;  Location: [facility] ENDOSCOPY;  Service: Cardiovascular;  Laterality: N/A;      reports that he has never smoked. He has never used smokeless tobacco. He reports that he does not drink alcohol or use drugs. Social History   Social History  . Marital status: Married    Spouse name: N/A  . Number of children: N/A  . Years of education: N/A   Occupational History  . licensed Chief Financial Officer     self-employed   Social History Main Topics  . Smoking status: Never Smoker  . Smokeless tobacco: Never Used  . Alcohol use No  . Drug use: No  . Sexual activity: Not on file   Other Topics Concern  . Not on file   Social History Narrative   Lives with his wife (married 1990).  Former Health visitor. Body building/weight lifting, running for exercise.    Allergies  Allergen Reactions  . Amitriptyline Shortness Of Breath  . Serotonin Reuptake Inhibitors (Ssris) Nausea And Vomiting    Pertinent  Health Maintenance Due  Topic Date Due  . COLONOSCOPY  09/09/1998  . INFLUENZA VACCINE  12/09/2015  . PNA vac Low Risk Adult (2 of 2 - PPSV23) 03/25/2017 (Originally 12/20/2014)    Medications: Allergies as of 08/03/2016      Reactions   Amitriptyline Shortness Of Breath   Serotonin Reuptake Inhibitors (ssris) Nausea And Vomiting      Medication List       Accurate as of 08/03/16  3:14 PM. Always use your most recent med list.          acetaminophen 325 MG tablet Commonly known as:  TYLENOL Take 2 tablets (650 mg total) by mouth  every 6 (six) hours as needed for mild pain (or Fever >/= 101).   cefTRIAXone 2 g in dextrose 5 % 50 mL Inject 2 g into the vein daily.   CERTAVITE/ANTIOXIDANTS Tabs Take 1 tablet by mouth daily.   furosemide 40 MG tablet Commonly known as:  LASIX Take 1 tablet (40 mg total) by mouth daily.   gentamicin 1.6-0.9 MG/ML-% Commonly known as:  GARAMYCIN Inject 50 mLs (80 mg total) into the vein daily.   metoprolol succinate 25 MG 24 hr tablet Commonly known as:  TOPROL-XL Take 1 tablet (25 mg total) by mouth daily.   omeprazole 20 MG capsule Commonly known as:  PRILOSEC Take 20 mg by mouth daily.   ondansetron 8 MG disintegrating tablet Commonly known as:  ZOFRAN ODT Take 1 tablet (8 mg total) by mouth every 8 (eight) hours as needed for nausea or vomiting.   oxyCODONE-acetaminophen 5-325 MG tablet Commonly known as:  PERCOCET/ROXICET Take 1 tablet by mouth every 8 (eight) hours as needed for moderate pain.   potassium chloride SA 20 MEQ tablet Commonly known as:  K-DUR,KLOR-CON Take 1 tablet (20 mEq total) by mouth daily.   rosuvastatin 10 MG tablet Commonly known as:  CRESTOR Take 1 tablet (10 mg total) by mouth daily at 6 PM.   saccharomyces boulardii 250 MG capsule Commonly known as:  FLORASTOR Take 1 capsule (250 mg total) by mouth 2 (two) times daily.   sodium chloride flush 0.9 % Soln Commonly known as:  NS 10-40 mLs by Intracatheter route as needed (flush).   vancomycin 50 mg/mL oral solution Commonly known as:  VANCOCIN Take 2.5 mLs (125 mg total) by mouth every 6 (six) hours.   warfarin 5 MG tablet Commonly known as:  COUMADIN Take 7.5 mg by mouth daily.       Review of Systems  Constitutional: Negative for activity change, appetite change, chills, fatigue and fever.  HENT: Negative for congestion, rhinorrhea, sinus pain, sinus pressure, sneezing and sore throat.   Eyes: Negative.   Respiratory: Negative for cough, chest tightness, shortness of  breath and wheezing.   Cardiovascular: Positive for leg swelling. Negative for chest pain and palpitations.  Gastrointestinal: Positive for nausea. Negative for abdominal distention, abdominal pain, constipation, diarrhea and vomiting.  Endocrine: Negative.   Genitourinary: Negative for dysuria, flank pain, frequency and urgency.  Musculoskeletal: Positive for gait problem.       Chronic pain   Skin: Negative for color change, pallor and rash.       Right arm PICC line   Neurological: Negative for dizziness, seizures, syncope, light-headedness, numbness and headaches.  Hematological: Does not bruise/bleed easily.  Psychiatric/Behavioral: Negative for agitation, hallucinations and sleep disturbance. The patient is not nervous/anxious.     Vitals:   08/03/16 1004  BP: 119/78  Pulse: 92  Resp: 18  Temp: 98 F (36.7 C)  TempSrc: Oral  SpO2: 95%  Weight: 196 lb (88.9 kg)  Height: _0  (1.778 m)   Body mass index is 28.12 kg/m. Physical Exam  Constitutional: He is oriented to person, place, and time. He appears well-developed and well-nourished. No distress.  Intermittent confusion at times   HENT:  Head: Normocephalic.  Mouth/Throat: Oropharynx is clear and moist. No oropharyngeal exudate.  Eyes: Conjunctivae and EOM are normal. Pupils are equal, round, and reactive to light. Right eye exhibits no discharge. Left eye exhibits no discharge. No scleral icterus.  Neck: Normal range of motion. No JVD present. No thyromegaly present.  Cardiovascular: Intact distal pulses.  Exam reveals no gallop and no friction rub.   Murmur heard. Irregular hear rate  Pulmonary/Chest: Effort normal and breath sounds normal. No respiratory distress. He has no wheezes. He has no rales.  Abdominal: Soft. Bowel sounds are normal. He exhibits no distension. There is no tenderness. There is no rebound and no guarding.  Musculoskeletal:  Moves x 4 extremities. Gait unsteady. Bilateral 1-2 + edema to lower  extremities. Ted hose in place.   Lymphadenopathy:    He has no cervical adenopathy.  Neurological: He is oriented to person, place, and time.  Intermittent confusion at times   Skin: Skin is warm and dry. No rash noted. No erythema. No pallor.  Psychiatric: He has a normal mood and affect.    Labs reviewed: Basic Metabolic Panel:  Recent Labs  07/23/16 0130  07/24/16 2008  07/25/16 0832 07/25/16 1400 07/26/16 1245  NA 134*  < >  136  < > 137 134* 138  K 3.6  < > 4.2  < > 4.4 4.0 3.8  CL 102  < > 104  < > 106 105 109  CO2 25  < > 24  --  20* 21* 21*  GLUCOSE 113*  < > 124*  < > 92 109* 118*  BUN 41*  < > 36*  < > 33* 29* 27*  CREATININE 2.17*  < > 1.66*  < > 1.38* 1.51* 1.17  CALCIUM 8.1*  < > 8.0*  --  8.2* 8.2* 8.2*  MG 2.2  --  2.0  --  2.0  --   --   < > = values in this interval not displayed. Liver Function Tests:  Recent Labs  07/24/16 2008 07/25/16 0832 07/25/16 1400  AST 44* 48* 44*  ALT 94* 84* 84*  ALKPHOS 46 47 52  BILITOT 1.0 0.9 1.1  PROT 5.2* 5.1* 5.2*  ALBUMIN 2.4* 2.5* 2.5*    Recent Labs  06/12/16 1118 07/24/16 2249  AMMONIA <9* 10   CBC:  Recent Labs  07/23/16 0546 07/24/16 2008  07/25/16 0832 07/25/16 1400 07/26/16 0345  WBC 18.1* 18.6*  --  19.3* 21.1* 18.6*  NEUTROABS 14.6* 15.7*  --   --  17.5*  --   HGB 9.5* 9.6*  < > 9.8* 10.1* 9.8*  HCT 29.6* 30.0*  < > 30.9* 31.7* 31.1*  MCV 94.0 95.5  --  95.1 95.2 95.1  PLT 68* 72*  --  96* 95* 110*  < > = values in this interval not displayed. Cardiac Enzymes:  Recent Labs  07/25/16 0742 07/25/16 1400 07/25/16 1936  TROPONINI 0.11* 0.08* 0.07*    Recent Labs  06/11/16 1011 06/14/16 0845 07/06/16 1025  GLUCAP 86 147* 70    Procedures and Imaging Studies during Hospital admission:  Dg Chest 2 View  Result Date: 07/19/2016 CLINICAL DATA:  Weakness and fatigue EXAM: CHEST  2 VIEW COMPARISON:  June 11, 2016 FINDINGS: There is airspace consolidation in the right lower  lobe with right pleural effusion. The left lung is clear. Heart is mildly enlarged with pulmonary venous hypertension. No adenopathy. Central catheter tip is in superior vena cava. Patient is status post coronary artery bypass grafting. There is a left atrial appendage clamp. There is degenerative change in the lower thoracic region. There is a small benign appearing cystic area in the right proximal humerus. IMPRESSION: Right base consolidation consistent with pneumonia with right pleural effusion. Left lung clear. There is pulmonary vascular congestion. No interstitial edema. Central catheter tip in superior vena cava. No evident pneumothorax. Electronically Signed   By: Lowella Grip III M.D.   On: 07/19/2016 10:23   Ct Head Wo Contrast  Result Date: 07/27/2016 CLINICAL DATA:  66 year old male with stroke. History of cancer and hypertension. EXAM: CT HEAD WITHOUT CONTRAST TECHNIQUE: Contiguous axial images were obtained from the base of the skull through the vertex without intravenous contrast. COMPARISON:  Head CT dated 07/25/2016 FINDINGS: Brain: There is mild dilatation of the ventricles and sulci consistent with age-related atrophy. Moderate periventricular and deep white matter chronic microvascular ischemic changes noted. An area of hypodensity involving the left cerebellar hemisphere consistent with left PICA territory infarct as seen on the prior CT. Small right cerebellar hemisphere old focal infarct is again noted. There is a small old right lentiform nucleus lacunar infarct. Stable area of old infarct and encephalomalacia involving the left temporal lobe. There is no acute intracranial  hemorrhage. There is no mass effect or midline shift. No extra-axial fluid collection. Vascular: No hyperdense vessel or unexpected calcification. Skull: Normal. Negative for fracture or focal lesion. Sinuses/Orbits: The visualized paranasal sinuses are clear. There is partial opacification of the right mastoid  air cells similar to prior CT. The left mastoid air cells are clear. No air-fluid levels. Other: None IMPRESSION: 1. No acute intracranial hemorrhage. 2. Moderate age-related atrophy and chronic microvascular ischemic changes. Stable area of infarct in the inferior left cerebellar hemisphere in the distribution of the PICA as seen on the prior CT. Electronically Signed   By: Anner Crete M.D.   On: 07/27/2016 07:01   Ct Head Wo Contrast  Result Date: 07/25/2016 CLINICAL DATA:  Followup stroke. Acute left PICA distribution. Other older embolic infarctions. EXAM: CT HEAD WITHOUT CONTRAST TECHNIQUE: Contiguous axial images were obtained from the base of the skull through the vertex without intravenous contrast. COMPARISON:  MRI same day.  CT yesterday. FINDINGS: Brain: Generalized atrophy again demonstrated. Low-density within the inferior cerebellum on the left appears the same, consistent with acute left PICA infarction. No evidence of hemorrhage. No increased swelling. No fourth ventricular compromise. Chronic small-vessel ischemic changes of the white matter remain evident. No hydrocephalus. No extra-axial collection. Vascular: There is atherosclerotic calcification of the major vessels at the base of the brain. Skull: Negative Sinuses/Orbits: Clear/normal Other: None IMPRESSION: No change since yesterday. Acute left PICA infarction with low-density in the inferior cerebellum on the left. No hemorrhage. No increased swelling. No fourth ventricular compromise. Electronically Signed   By: Nelson Chimes M.D.   On: 07/25/2016 11:55   Ct Head Wo Contrast  Result Date: 07/24/2016 CLINICAL DATA:  Altered mental status. Patient woke up screening in then was unresponsive. Left the hospital yesterday with diagnosis of Clostridium difficile. EXAM: CT HEAD WITHOUT CONTRAST TECHNIQUE: Contiguous axial images were obtained from the base of the skull through the vertex without intravenous contrast. COMPARISON:  MRI  brain 06/12/2016.  CT head 06/11/2016. FINDINGS: Brain: Focal low-attenuation areas demonstrated in the left anterior temporal lobe and in the left inferior cerebellar hemisphere, progressing since previous study, suggesting acute ischemic process. The left temporal changes were demonstrated on the previous MRI but the cerebellar changes appear to be new since that time. There is mild mass effect in the left posterior fossa with mild displacement and effacement of the fourth ventricle. 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 Diffuse cerebral atrophy. Ventricular dilatation consistent with central atrophy. Low-attenuation in the deep white matter consistent with small vessel ischemia. No midline shift. No abnormal extra-axial fluid collections. Gray-white matter junctions are distinct. Basal cisterns are not effaced. No acute intracranial hemorrhage. Vascular: Vascular calcifications are present in the carotid siphons and vertebrobasilar arteries peer Skull: Calvarium appears intact. Sinuses/Orbits: Paranasal sinuses and mastoid air cells are clear. Other: None. IMPRESSION: 1. Focal low-attenuation in the left anterior temporal lobe consistent with progressing infarct in the distribution of the left middle cerebral artery and as previously seen on MRI. 2. New area of low-attenuation in the left inferior cerebellar hemisphere with associated mass effect is consistent with acute infarct in the distribution of the left posterior inferior cerebral artery. This was not present on prior MRI, suggesting a new interval process. 3. Chronic atrophy and small vessel ischemic changes. No acute intracranial hemorrhage. Electronically Signed   By: Lucienne Capers M.D.   On: 07/24/2016 23:51   Mr Jodene Nam Head Wo Contrast  Result Date: 07/25/2016 CLINICAL DATA:  History of intracranial septic  emboli. Altered mental status with speech disturbance. EXAM: MRI HEAD WITHOUT CONTRAST MRA HEAD WITHOUT CONTRAST TECHNIQUE: Multiplanar, multiecho  pulse sequences of the brain and surrounding structures were obtained without intravenous contrast. Angiographic images of the head were obtained using MRA technique without contrast. COMPARISON:  Brain MRI 12/30/2016 FINDINGS: MRI HEAD FINDINGS Brain: There is diffusion restriction throughout the lower left cerebellum, in the distribution of the left posterior inferior cerebellar artery. There are multiple other areas of high signal on diffusion-weighted imaging in the posterior right temporal lobe and lateral left temporal lobe, as well as in the periatrial white matter of the right parietal lobe. ADC values at these locations are relatively normal, likely indicating that these are areas of subacute ischemia. There is hyperintense T2 weighted signal within the left cerebellum, left occipital lobe, left temporal lobe and within the periventricular white matter. A No mass lesion or midline shift. No hydrocephalus or extra-axial fluid collection. The midline structures are normal. No age advanced or lobar predominant atrophy. Vascular: Major intracranial arterial and venous sinus flow voids are preserved. Two foci of punctate chronic microhemorrhage of the left basal ganglia. Skull and upper cervical spine: The visualized skull base, calvarium, upper cervical spine and extracranial soft tissues are normal. Sinuses/Orbits: No fluid levels or advanced mucosal thickening. No mastoid effusion. Normal orbits. MRA HEAD FINDINGS Intracranial internal carotid arteries: Normal. Anterior cerebral arteries: Congenitally absent left A1 segment. Otherwise normal. Middle cerebral arteries: Normal. Posterior communicating arteries: Absent bilaterally. Posterior cerebral arteries: Normal. Basilar artery: Normal. Vertebral arteries: Right-dominant. There is minimal flow related enhancement within the left vertebral artery. Superior cerebellar arteries: Normal. Anterior inferior cerebellar arteries: Normal. Posterior inferior  cerebellar arteries: Normal on the right. Left PICA is not visualized and likely occluded. IMPRESSION: 1. 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 No associated acute hemorrhage or mass effect. 2. Scattered areas of DWI abnormality in the left temporal lobe, right temporal lobe and right parietal lobe are favored to be areas of subacute ischemia. The distribution is again suggestive of a central embolic process, though likely occurring at a different time from the current acute left PICA infarct. 3. 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 Electronically Signed   By: Ulyses Jarred M.D.   On: 07/25/2016 03:15   Mr Brain Wo Contrast  Result Date: 07/25/2016 CLINICAL DATA:  History of intracranial septic emboli. Altered mental status with speech disturbance. EXAM: MRI HEAD WITHOUT CONTRAST MRA HEAD WITHOUT CONTRAST TECHNIQUE: Multiplanar, multiecho pulse sequences of the brain and surrounding structures were obtained without intravenous contrast. Angiographic images of the head were obtained using MRA technique without contrast. COMPARISON:  Brain MRI 12/30/2016 FINDINGS: MRI HEAD FINDINGS Brain: There is diffusion restriction throughout the lower left cerebellum, in the distribution of the left posterior inferior cerebellar artery. There are multiple other areas of high signal on diffusion-weighted imaging in the posterior right temporal lobe and lateral left temporal lobe, as well as in the periatrial white matter of the right parietal lobe. ADC values at these locations are relatively normal, likely indicating that these are areas of subacute ischemia. There is hyperintense T2 weighted signal within the left cerebellum, left occipital lobe, left temporal lobe and within the periventricular white matter. A No mass lesion or midline shift. No hydrocephalus or extra-axial fluid collection. The midline  structures are normal. No age advanced or lobar predominant atrophy. Vascular: Major intracranial arterial and venous sinus flow voids  are preserved. Two foci of punctate chronic microhemorrhage of the left basal ganglia. Skull and upper cervical spine: The visualized skull base, calvarium, upper cervical spine and extracranial soft tissues are normal. Sinuses/Orbits: No fluid levels or advanced mucosal thickening. No mastoid effusion. Normal orbits. MRA HEAD FINDINGS Intracranial internal carotid arteries: Normal. Anterior cerebral arteries: Congenitally absent left A1 segment. Otherwise normal. Middle cerebral arteries: Normal. Posterior communicating arteries: Absent bilaterally. Posterior cerebral arteries: Normal. Basilar artery: Normal. Vertebral arteries: Right-dominant. There is minimal flow related enhancement within the left vertebral artery. Superior cerebellar arteries: Normal. Anterior inferior cerebellar arteries: Normal. Posterior inferior cerebellar arteries: Normal on the right. Left PICA is not visualized and likely occluded. IMPRESSION: 1. 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 No associated acute hemorrhage or mass effect. 2. Scattered areas of DWI abnormality in the left temporal lobe, right temporal lobe and right parietal lobe are favored to be areas of subacute ischemia. The distribution is again suggestive of a central embolic process, though likely occurring at a different time from the current acute left PICA infarct. 3. 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 Electronically Signed   By: Ulyses Jarred M.D.   On: 07/25/2016 03:15   US Scrotum  Result Date: 07/20/2016 CLINICAL DATA:  One week history of scrotal pain EXAM: SCROTAL ULTRASOUND DOPPLER ULTRASOUND OF THE TESTICLES TECHNIQUE: Complete ultrasound examination of the testicles, epididymis, and other scrotal structures was  performed. Color and spectral Doppler ultrasound were also utilized to evaluate blood flow to the testicles. COMPARISON:  None. FINDINGS: Right testicle Measurements: 5.2 x 4.0 x 3.7 cm. The testicular parenchyma is diffusely inhomogeneous with areas of relative hyperechoic and hypoechoic parenchyma. No well-defined mass seen. No hyperemia seen on the right. Left testicle Measurements: 4.9 x 3.8 x 3.8 cm. Testicular parenchyma is diffusely inhomogeneous with areas of relative hyperechoic and hypoechoic testicular parenchyma. Appearance is similar to the contralateral side. No well-defined mass or hyperemia seen on the left. Right epididymis:  Normal in size and appearance. Left epididymis:  Normal in size and appearance. Hydrocele:  Moderate hydroceles bilaterally. Varicocele: Small varicocele on the left. No varicocele on the right. Pulsed Doppler interrogation of both testes demonstrates normal low resistance arterial and venous waveforms bilaterally. No scrotal abscess or scrotal wall thickening. IMPRESSION: Diffusely inhomogeneous testicular parenchyma bilaterally without well-defined mass on either side. No hyperemia. This appearance raises concern for infiltrative type lesions within each testis. 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 More infiltrative type lesions such is extraosseous multiple myeloma or lymphoma can involve both testes simultaneously and must be of concern. The lack of hyperemia makes bilateral orchitis seen less likely, although the inhomogeneous appearance of the testicular parenchyma could be due to infectious etiology. Moderate hydroceles bilaterally. No extratesticular mass.  No torsion on either side. These results will be called to the ordering clinician or representative by the Radiologist Assistant, and communication documented in the PACS or zVision Dashboard. Electronically Signed   By: Lowella Grip III M.D.   On: 07/20/2016 16:32   US Renal  Result Date:  07/20/2016 CLINICAL DATA:  Elevated creatinine and BUN, history of kidney stones, hypertension EXAM: RENAL / URINARY TRACT ULTRASOUND COMPLETE COMPARISON:  CT abdomen pelvis of 11/12/2015 FINDINGS: Right Kidney: Length: 11.4 cm. No hydronephrosis is seen. No renal calculi are evident. Left Kidney: Length: 11.2 cm. No hydronephrosis is seen. There is a nonobstructing calculus in the mid lower kidney of  7 mm in diameter. Also, there is a rounded structure which is hypoechogenic with some internal echoes. No blood flow is demonstrated and there is some through transmission. This probably represents a complex cyst, but further assessment with CT or preferably MRI would be recommended. Bladder: The urinary bladder is unremarkable other than slight prominence of the prostate indenting the posterior urinary bladder. IMPRESSION: 1. Nonobstructing left renal calculus of 7 mm in diameter. 2. Probable complex cyst in the mid left kidney but further assessment with CT or preferably MRI would be recommended. 3. Prominent prostate. Electronically Signed   By: Ivar Drape M.D.   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 EXAM: NUCLEAR MEDICINE PET SKULL BASE TO THIGH TECHNIQUE: 13.1 mCi F-18 FDG was injected intravenously. Full-ring PET imaging was performed from the skull base to thigh after the radiotracer. CT data was obtained and used for attenuation correction and anatomic localization. FASTING BLOOD GLUCOSE:  Value: 70 mg/dl COMPARISON:  None. FINDINGS: NECK Along the right posterior tongue base and extending into the pharyngoepiglottic fold and epiglottis on the right side, and also into the glossoepiglottic fold and crossing the midline, there is a ill-defined mass lesion with maximum standard uptake value 12.4. This extends along the upper margin of the hyoid bone but is not  obviously destroying the hyoid bone. Along the anterior margin the right sternocleidomastoid muscle there is an ill-defined focus of hypermetabolic activity corresponding to a station IIa node of approximately 1.1 cm in short axis on image 31/3, maximum SUV 12.4. A larger low-density node above this in station IIa measures 2.1 cm in short axis on image 25/3 but has only low-grade metabolic activity with maximum SUV of about 5.0. A left-sided station IIa lymph node measuring 1.2 cm in short axis on image 36/3 has a maximum standard uptake value of 6.2. This is right about at the level of the hyoid. A left level III lymph node measuring 1.4 cm on image 44/3 has a maximum SUV of 5.9. CHEST Faintly accentuated metabolic activity along coronary stent, thought to be incidental. No hypermetabolic adenopathy in the chest. Mild cardiomegaly noted. Prosthetic mitral valve. Prior CABG. Coronary, aortic arch, and branch vessel atherosclerotic vascular disease. Moderate right and small left pleural effusion with associated passive atelectasis. There are scattered mildly prominent lymph nodes observed in the mediastinum with regard to size, including a 1.5 cm right lower paratracheal node on image 82 of series 3. These have relatively low standard uptake value, this paratracheal node has a maximum SUV of only 2.3. Background mediastinal blood pool activity has an SUV of 2.6. There appears to be a small loculated component of the right pleural effusion extending into the space between the right pectoralis major and minor in the third intercostal space. Numbering of the intercostal spaces is tricky as the first and second ribs are fused along parts of their length bilaterally. I suspect this appearance could be due to a prior thoracotomy. ABDOMEN/PELVIS No abnormal hypermetabolic activity within the liver, pancreas, adrenal glands, or spleen. No hypermetabolic lymph nodes in the abdomen or pelvis. Cholelithiasis. Left mid kidney 7  mm nonobstructive renal calculus. There several punctate 2-3 mm right renal calculi. No hydronephrosis or hydroureter. Descending and sigmoid colon diverticulosis. Enlarged prostate gland indents the bladder base. SKELETON Small focus of hypermetabolic activity along the posterior margin of the L5 vertebra  just to left of midline, conceivably this could be inflammatory or due to an active Schmorl's node, I suspect that there is an adjacent disc protrusion. Maximum SUV of this small focal lesion is 6.6. I am skeptical of a small metastatic lesion although strictly speaking a small metastatic lesion is not laterally excluded. There is however considerable lumbar spondylosis and some degree of scoliosis with anterolisthesis at the L4-5 level and suspected multilevel impingement. IMPRESSION: 1. 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 There is associated hypermetabolic bilateral station IIa and left station III adenopathy. 2. There is a small focus of hypermetabolic activity along the posterior inferior endplate of L5. I suspect that this is probably due to an active Schmorl's node given the adjacent suspected disc protrusion. Strictly speaking, a small bony metastatic lesion, while seemingly unlikely, is not readily excluded. Given the degree of lumbar spondylosis, scoliosis, and degenerative disc disease, lumbar spine MRI should be considered both to rule out osseous metastatic disease and also characterize the degree of impingement. 3. Other imaging findings of potential clinical significance: Mild cardiomegaly. Coronary, aortic arch, and branch vessel atherosclerotic vascular disease. Prior CABG and prosthetic mitral valve. Bilateral pleural effusions. Small loculated component of the right pleural effusion extends and the right third intercostal space in between the  pectoralis muscles, possibly related to a prior thoracotomy. Cholelithiasis. Bilateral nonobstructive nephrolithiasis. Enlarged prostate gland. Descending and sigmoid colon diverticulosis. Electronically Signed   By: Van Clines M.D.   On: 07/06/2016 14:46   [place] Art/ven Flow Abd Pelv Doppler  Result Date: 07/20/2016 CLINICAL DATA:  One week history of scrotal pain EXAM: SCROTAL ULTRASOUND DOPPLER ULTRASOUND OF THE TESTICLES TECHNIQUE: Complete ultrasound examination of the testicles, epididymis, and other scrotal structures was performed. Color and spectral Doppler ultrasound were also utilized to evaluate blood flow to the testicles. COMPARISON:  None. FINDINGS: Right testicle Measurements: 5.2 x 4.0 x 3.7 cm. The testicular parenchyma is diffusely inhomogeneous with areas of relative hyperechoic and hypoechoic parenchyma. No well-defined mass seen. No hyperemia seen on the right. Left testicle Measurements: 4.9 x 3.8 x 3.8 cm. Testicular parenchyma is diffusely inhomogeneous with areas of relative hyperechoic and hypoechoic testicular parenchyma. Appearance is similar to the contralateral side. No well-defined mass or hyperemia seen on the left. Right epididymis:  Normal in size and appearance. Left epididymis:  Normal in size and appearance. Hydrocele:  Moderate hydroceles bilaterally. Varicocele: Small varicocele on the left. No varicocele on the right. Pulsed Doppler interrogation of both testes demonstrates normal low resistance arterial and venous waveforms bilaterally. No scrotal abscess or scrotal wall thickening. IMPRESSION: Diffusely inhomogeneous testicular parenchyma bilaterally without well-defined mass on either side. No hyperemia. This appearance raises concern for infiltrative type lesions within each testis. 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 More infiltrative type lesions such is extraosseous multiple myeloma or lymphoma can involve both testes simultaneously and must be of  concern. The lack of hyperemia makes bilateral orchitis seen less likely, although the inhomogeneous appearance of the testicular parenchyma could be due to infectious etiology. Moderate hydroceles bilaterally. No extratesticular mass.  No torsion on either side. These results will be called to the ordering clinician or representative by the Radiologist Assistant, and communication documented in the PACS or zVision Dashboard. Electronically Signed   By: Lowella Grip III M.D.   On: 07/20/2016 16:32   Dg Chest Portable 1 View  Result Date:  07/24/2016 CLINICAL DATA:  66 year old male with altered mental status EXAM: PORTABLE CHEST 1 VIEW COMPARISON:  Chest radiograph dated 07/19/2016 FINDINGS: There has been interval removal of the right-sided PICC. There is stable cardiomegaly. Mechanical cardiac valve and left atrial appendage occlusion device noted. There is mild vascular congestion. There has been significant interval improvement of the previously seen right pleural effusion and right lung base airspace densities. Small amount of pleural effusion and associated atelectasis versus infiltrate remains. The left lung is clear. There is no pneumothorax. Median sternotomy wires noted. No acute osseous pathology. IMPRESSION: 1. Interval improvement of the previously seen right-sided pleural effusion and right lung base airspace densities with minimal amount of residual pleural effusion and associated right lung base atelectasis/infiltrate. 2. Stable cardiomegaly with probable mild vascular congestion. No pulmonary edema. Electronically Signed   By: Anner Crete M.D.   On: 07/24/2016 20:23   Dg Swallowing Func-speech Pathology  Result Date: 07/25/2016 Objective Swallowing Evaluation: Type of Study: MBS-Modified Barium Swallow Study Patient Details Name: Isamar Wellbrock MRN: [id number] Date of Birth: Apr 13, 1951 Today's Date: 07/25/2016 Time: SLP Start Time (ACUTE ONLY): 1530-SLP Stop Time (ACUTE ONLY): 1550 SLP Time  Calculation (min) (ACUTE ONLY): 20 min Past Medical History: Past Medical History: Diagnosis Date . Acute CHF (congestive heart failure) ([place]) 11/01/2015 . Acute on chronic diastolic heart failure ([place]) 09/27/2015 . Acute pulmonary embolus ([place]) 2010 . Acute respiratory failure with hypoxia ([place]) 09/09/2015 . Arthritis  . Atrial fibrillation [facility])   s/p inpatient DCCV 09/11/2015 . Atrial flutter ([place])  . CHF (congestive heart failure) ([place])  . Coronary artery disease involving native coronary artery 05/15/2015  multivessel . Dysrhythmia   A-fib . Essential hypertension  . Fibromyalgia  . History of kidney stones  . Hypercholesterolemia  . Hypertension  . Lupus anticoagulant positive 07/29/2008 . Mitral regurgitation 10/30/2015 . PMR (polymyalgia rheumatica) ([facility])  . Pneumonia  . 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 S/P Maze operation for atrial fibrillation 05/29/2015  Complete bilateral atrial lesion set via median sternotomy using bipolar radiofrequency and cryothermy ablation with clipping of LA appendage . S/P minimally invasive mitral valve repair 11/21/2015  Complex valvuloplasty including artificial Gore-tex neochord placement x12 with 32 mm Sorin Memo 3D ring annuloplasty via right mini thoracotomy approach . 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 APPENDECTOMY   . arm surgery  Right   wrist and elbow . CARDIAC CATHETERIZATION N/A 05/15/2015  Procedure: Left Heart Cath and Coronary Angiography;  Surgeon: Peter M Martinique, MD;  Location: [place] [facility];  Service: Cardiovascular;  Laterality: N/A; . CARDIAC CATHETERIZATION N/A 11/17/2015  Procedure: Right/Left Heart Cath and Coronary/Graft Angiography;  Surgeon: Adrian Prows, MD;  Location: [place] [facility];  Service: Cardiovascular;  Laterality: N/A; . CARDIOVERSION N/A 09/11/2015  Procedure: CARDIOVERSION;  Surgeon: Lelon Perla, MD;  Location: [facility] ENDOSCOPY;  Service: Cardiovascular;  Laterality: N/A; . CHEST TUBE INSERTION  11/21/2015  Procedure: LEFT CHEST TUBE INSERTION;  Surgeon: Rexene Alberts, MD;  Location: [place];  Service: Open Heart Surgery;; . CLIPPING OF ATRIAL APPENDAGE  05/29/2015  Procedure: CLIPPING OF LEFT ATRIAL APPENDAGE;  Surgeon: Rexene Alberts, MD;  Location: [place];  Service: Open Heart Surgery;;  45 AtriClip PRO 145 . CORONARY ARTERY BYPASS GRAFT N/A 05/29/2015  Procedure: CORONARY ARTERY BYPASS GRAFTING (CABG), ON PUMP,  TIMES THREE, USING LEFT INTERNAL MAMMARY ARTERY, BILATERAL GREATER SAPHENOUS VEINS HARVESTED ENDOSCOPICALLY;  Surgeon: Rexene Alberts, MD;  Location: [place];  Service: Open Heart Surgery;  Laterality: N/A; . FRACTURE SURGERY    right wrist . HERNIA REPAIR   . MAZE N/A 05/29/2015  Procedure: MAZE;  Surgeon: Rexene Alberts, MD;  Location: [place];  Service: Open Heart Surgery;  Laterality: N/A;  Complete Bi-Atrial Lesion set with Ablation and Cryothermy . MITRAL VALVE REPAIR Right 11/21/2015  Procedure: MINIMALLY INVASIVE REOPERATION FOR MITRAL VALVE REPAIR;  Surgeon: Rexene Alberts, MD;  Location: [place];  Service: Open Heart Surgery;  Laterality: Right; . PERIPHERAL VASCULAR CATHETERIZATION Bilateral 11/17/2015  Procedure: Renal Angiography;  Surgeon: Adrian Prows, MD;  Location: [place];  Service: Cardiovascular;  Laterality: Bilateral; . PERIPHERAL VASCULAR CATHETERIZATION N/A 11/17/2015  Procedure: Abdominal Aortogram;  Surgeon: Adrian Prows, MD;  Location: [place] [facility];  Service: Cardiovascular;  Laterality: N/A; . SHOULDER SURGERY Left   clavicular fracture . TEE WITHOUT CARDIOVERSION N/A 05/29/2015  Procedure: TRANSESOPHAGEAL ECHOCARDIOGRAM (TEE);  Surgeon: Rexene Alberts, MD;  Location: [place];  Service: Open Heart Surgery;  Laterality: N/A; . TEE WITHOUT CARDIOVERSION N/A 10/30/2015  Procedure: TRANSESOPHAGEAL ECHOCARDIOGRAM (TEE);  Surgeon: Adrian Prows, MD;  Location: [place];   Service: Cardiovascular;  Laterality: N/A; . 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 Presented with episode of unresponsiveness. HEad CT shows new CVA (Occlusion of the left posterior inferior cerebellar artery with associated acute infarct of the inferior left cerebellar hemisphere, Scattered areas of DWI abnormality in the left temporal lobe, right temporal lobe and right parietal lobe are favored to be areas of subacute ischemia). Recently discharged from [place] where he was admitted with generalized weakness and found to have NCAP with acute hypoxia nad respiratory failure. Admission CXR: Interval improvement of the previously seen right-sided pleural effusion and right lung base airspace densities with minimal amount of residual pleural effusion and associated right lung base atelectasis/infiltrate. Note that bedside swallow evaluation complete during 06/2016 admission with noted s/s of aspiration, recommendations for dysphagia 3, thin liquids and MBS however at the time patient declined MBS.  Subjective: "i can chew anything." Assessment / Plan / Recommendation CHL IP CLINICAL IMPRESSIONS 07/25/2016 Clinical Impression Patient presents with moderate, multifactorial pharyngeal dysphagia in the setting of acute CVA and recently diagnosed base of tongue cancer. Patient with anatomical changes (suspect 2/2 BOT cancer) and reduced hyolarygneal excursion which impact airway protection and bolus clearance  through the UES. This frequently resulted in shallow penetration during the swallow with all liquid consistencies. Patient also has sensory deficits leading to premature spillage with thin and nectar thick liquids when taken in consecutive sips with both sensed and silent aspiration during the swallow. Mild-moderate residue remained in the pyriform sinuses and cp segment after the swallow with all consistencies (thin, nectar and honey-thick liquids) and solids. This residue occasionally spilled into the larygneal vestibule after the swallow. Pt with difficulty following commands for swallow maneuvers or even to take single sips of liquid. Responded well to very  basic commands "Sip, swallow, cough, swallow," but suspect he will have difficulty with carryover of this strategy without supervision. Patient was consistently able to clear penetrate with the above cues, and subsequent swallow is noted to reduce residue. Spoke with patient regarding risks of aspiration, which is minimized with use of strategies. He expresses a desire to continue eating and drinking. Recommend dys 3 diet with thin liquid via single cup sip only, with use of hard cough, reswallow after each sip, bite. Will follow up with patient and his wife for education and training in compensatory strategies, diet tolerance. SLP Visit Diagnosis Dysphagia, pharyngeal phase (R13.13) Attention and concentration deficit following -- Frontal lobe and executive function deficit following -- Impact on safety and function Severe aspiration risk;Risk for inadequate nutrition/hydration;Moderate aspiration risk   CHL IP TREATMENT RECOMMENDATION 07/25/2016 Treatment Recommendations Therapy as outlined in treatment plan below   Prognosis 07/25/2016 Prognosis for Safe Diet Advancement Fair Barriers to Reach Goals Other (Comment) Barriers/Prognosis Comment (No Data) CHL IP DIET RECOMMENDATION 07/25/2016 SLP Diet Recommendations Dysphagia 3 (Mech soft) solids;Thin liquid  Liquid Administration via Cup Medication Administration Whole meds with puree Compensations Minimize environmental distractions;Slow rate;Small sips/bites;Hard cough after swallow;Multiple dry swallows after each bite/sip Postural Changes Seated upright at 90 degrees   CHL IP OTHER RECOMMENDATIONS 07/25/2016 Recommended Consults -- Oral Care Recommendations Oral care before and after PO Other Recommendations --   CHL IP FOLLOW UP RECOMMENDATIONS 07/25/2016 Follow up Recommendations Outpatient SLP   CHL IP FREQUENCY AND DURATION 07/25/2016 Speech Therapy Frequency (ACUTE ONLY) min 2x/week Treatment Duration 2 weeks      CHL IP ORAL PHASE 07/25/2016 Oral Phase WFL Oral - Pudding Teaspoon -- Oral - Pudding Cup -- Oral - Honey Teaspoon -- Oral - Honey Cup -- Oral - Nectar Teaspoon -- Oral - Nectar Cup -- Oral - Nectar Straw -- Oral - Thin Teaspoon -- Oral - Thin Cup -- Oral - Thin Straw -- Oral - Puree -- Oral - Mech Soft -- Oral - Regular -- Oral - Multi-Consistency -- Oral - Pill -- Oral Phase - Comment --  CHL IP PHARYNGEAL PHASE 07/25/2016 Pharyngeal Phase Impaired Pharyngeal- Pudding Teaspoon -- Pharyngeal -- Pharyngeal- Pudding Cup -- Pharyngeal -- Pharyngeal- Honey Teaspoon -- Pharyngeal -- Pharyngeal- Honey Cup Reduced epiglottic inversion;Reduced airway/laryngeal closure;Penetration/Aspiration during swallow;Penetration/Apiration after swallow;Pharyngeal residue - pyriform;Pharyngeal residue - cp segment;Reduced laryngeal elevation;Reduced anterior laryngeal mobility Pharyngeal Material enters airway, remains ABOVE vocal cords and not ejected out Pharyngeal- Nectar Teaspoon -- Pharyngeal -- Pharyngeal- Nectar Cup Reduced epiglottic inversion;Reduced airway/laryngeal closure;Reduced laryngeal elevation;Reduced anterior laryngeal mobility;Penetration/Aspiration during swallow;Penetration/Apiration after swallow;Trace aspiration;Pharyngeal residue - cp segment;Pharyngeal residue - pyriform Pharyngeal Material enters  airway, passes BELOW cords without attempt by patient to eject out (silent aspiration) Pharyngeal- Nectar Straw -- Pharyngeal -- Pharyngeal- Thin Teaspoon Reduced anterior laryngeal mobility;Reduced epiglottic inversion;Reduced laryngeal elevation;Reduced airway/laryngeal closure;Penetration/Aspiration during swallow;Penetration/Apiration after swallow;Moderate aspiration;Pharyngeal residue - pyriform;Pharyngeal residue - cp segment Pharyngeal Material enters airway, remains ABOVE vocal cords and not ejected out Pharyngeal- Thin Cup Reduced epiglottic inversion;Reduced anterior laryngeal mobility;Reduced laryngeal elevation;Reduced airway/laryngeal closure;Moderate aspiration;Penetration/Aspiration during swallow;Penetration/Apiration after swallow;Pharyngeal residue - pyriform;Pharyngeal residue - cp segment Pharyngeal Material enters airway, passes BELOW cords and not ejected out despite cough attempt by patient Pharyngeal- Thin Straw -- Pharyngeal -- Pharyngeal- Puree Reduced epiglottic inversion;Reduced anterior laryngeal mobility;Reduced laryngeal elevation;Reduced airway/laryngeal closure;Penetration/Apiration after swallow;Pharyngeal residue - pyriform;Pharyngeal residue - cp segment Pharyngeal Material enters airway, remains ABOVE vocal cords and not ejected out Pharyngeal- Mechanical Soft Reduced epiglottic inversion;Reduced anterior  laryngeal mobility;Reduced laryngeal elevation;Reduced airway/laryngeal closure;Penetration/Apiration after swallow;Pharyngeal residue - pyriform;Pharyngeal residue - cp segment Pharyngeal Material enters airway, remains ABOVE vocal cords and not ejected out Pharyngeal- Regular Reduced epiglottic inversion;Reduced anterior laryngeal mobility;Reduced laryngeal elevation;Reduced airway/laryngeal closure;Penetration/Apiration after swallow;Pharyngeal residue - pyriform;Pharyngeal residue - cp segment Pharyngeal Material enters airway, remains ABOVE vocal cords and not ejected out  Pharyngeal- Multi-consistency -- Pharyngeal -- Pharyngeal- Pill WFL Pharyngeal -- Pharyngeal Comment --  CHL IP CERVICAL ESOPHAGEAL PHASE 07/25/2016 Cervical Esophageal Phase Impaired Pudding Teaspoon -- Pudding Cup -- Honey Teaspoon -- Honey Cup -- Nectar Teaspoon -- Nectar Cup -- Nectar Straw -- Thin Teaspoon -- Thin Cup -- Thin Straw -- Puree -- Mechanical Soft -- Regular -- Multi-consistency -- Pill -- Cervical Esophageal Comment reduced duration and amplitude of opening 2/2 impaired hyolaryngeal excursion Deneise Lever, MS CF-SLP Speech-Language Pathologist [phone] No flowsheet data found. Aliene Altes 07/25/2016, 6:36 PM              Assessment/Plan:   1. Unsteady gait  Has worked well with PT/ OT. Will discharge home PT/OT to continue with ROM, Exercise, Gait stability and muscle strengthening.No DME required has own FWW at home. HH aid for assistance with ADL's.Fall and safety precautions.   2. Atrial fibrillation  on warfarin 7.5 mg Tablet daily. Latest INR 4.2 (08/03/2016). Warfarin currently on Hold.HHRN to recheck INR on 08/05/2016.     3. Hypertensive heart disease with CHF (congestive heart failure) B/p stable. Continue on metoprolol and furosemide. BMP weekly while on I.V antibiotics.   4. Chronic diastolic CHF (congestive heart failure) Stable. Bilateral lower extremities edema.Knee high ted hose in place. Continue Furosemide and metoprolol. Continue Fluid restrictions 1.5 L  Daily.check daily weight.   5. 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  Pharmacy to monitor Gentamicin level, renal function and adjust dose.    6. Clostridium difficile infection Continue on oral vancomycin until 09/08/2016 or as recommended by infectious disease.   7. Dyslipidemia Continue on Crestor. Lipid panel with PCP.   8. Chronic pain syndrome Current regimen effective.   9. Oropharyngeal dysphagia Will discharge with [facility] for evaluation.   Patient is being discharged with the following home health services:    -PT/OT for ROM, exercise, gait stability and muscle strengthening  -  HH RN for I.V medication administration   - HH Aid for ADL's assist   Patient is being discharged with the following durable medical equipment:    - None required has own FWW at home.   Patient has been advised to f/u with their PCP in 1-2 weeks to for a transitions of care visit.Social services at their facility was responsible for arranging this appointment. Pt was provided with adequate prescriptions of noncontrolled medications to reach the scheduled appointment.For controlled substances, a limited supply was provided as appropriate for the individual patient. If the pt normally receives these medications from a pain clinic or has a contract with another physician, these medications should be received from that clinic or physician only).    Future labs/tests needed:  Gentamicin level, CBC, BMP weekly per [facility] protocol. Pharmacy to monitor Gentamicin  level, renal function and adjust dose

## 2016-08-04 ENCOUNTER — Telehealth: Payer: Self-pay | Admitting: Internal Medicine

## 2016-08-04 ENCOUNTER — Telehealth: Payer: Self-pay | Admitting: Pharmacist

## 2016-08-04 ENCOUNTER — Other Ambulatory Visit: Payer: Self-pay | Admitting: Internal Medicine

## 2016-08-04 DIAGNOSIS — B954 Other streptococcus as the cause of diseases classified elsewhere: Secondary | ICD-10-CM | POA: Diagnosis not present

## 2016-08-04 DIAGNOSIS — Z452 Encounter for adjustment and management of vascular access device: Secondary | ICD-10-CM | POA: Diagnosis not present

## 2016-08-04 DIAGNOSIS — I110 Hypertensive heart disease with heart failure: Secondary | ICD-10-CM | POA: Diagnosis not present

## 2016-08-04 DIAGNOSIS — I330 Acute and subacute infective endocarditis: Secondary | ICD-10-CM | POA: Diagnosis not present

## 2016-08-04 DIAGNOSIS — C069 Malignant neoplasm of mouth, unspecified: Secondary | ICD-10-CM | POA: Diagnosis not present

## 2016-08-04 DIAGNOSIS — I25810 Atherosclerosis of coronary artery bypass graft(s) without angina pectoris: Secondary | ICD-10-CM | POA: Diagnosis not present

## 2016-08-04 MED ORDER — VANCOMYCIN 50 MG/ML ORAL SOLUTION: 125.0000 mg | Freq: Four times a day (QID) | ORAL | 2 refills | Status: AC

## 2016-08-04 NOTE — Telephone Encounter (Signed)
Peter Bruce is on Ceftriaxone + Gentamicin for his persistent VGS PVE.  He developed acute kidney failure while hospitalized so we wanted to keep a close eye on his SCr when discharged.  Dr. Michel Bickers, ID physician, last note indicated he would like a 3x per week BMET and CBC.  Patient was discharged to [facility] on 3/22.  Called [facility] several times over the last several days and finally was able to talk to someone in medical records.  They did not have orders to check labs.  The last SCr from the patient was on 3/19 and was 1.17.  Finally received labs from today and his SCr was 1.44.  Per Dr. Megan Salon, will d/c gent and continue ceftriaxone.  Pt is now discharged from [facility] and [facility] is taking over for home health. I called Jeani Hawking at [facility] and gave order to d/c the gent.  He will continue ceftriaxone until 4/30.

## 2016-08-04 NOTE — Telephone Encounter (Signed)
I received a phone call from Peter Bruce wife, Peter Bruce, tonight. Peter Bruce left [place] today and is back at home. Peter Bruce was not pleased with the care he was receiving there. It failed to do blood work as I had instructed and he had a fall at the facility. When she got home and met with her nurse from [place] they realize that he had not been given a prescription to continue oral vancomycin. I sent in a new prescription [facility].

## 2016-08-05 ENCOUNTER — Telehealth: Payer: Self-pay | Admitting: *Deleted

## 2016-08-05 DIAGNOSIS — Z452 Encounter for adjustment and management of vascular access device: Secondary | ICD-10-CM | POA: Diagnosis not present

## 2016-08-05 DIAGNOSIS — C069 Malignant neoplasm of mouth, unspecified: Secondary | ICD-10-CM | POA: Diagnosis not present

## 2016-08-05 DIAGNOSIS — I330 Acute and subacute infective endocarditis: Secondary | ICD-10-CM | POA: Diagnosis not present

## 2016-08-05 DIAGNOSIS — I110 Hypertensive heart disease with heart failure: Secondary | ICD-10-CM | POA: Diagnosis not present

## 2016-08-05 DIAGNOSIS — B954 Other streptococcus as the cause of diseases classified elsewhere: Secondary | ICD-10-CM | POA: Diagnosis not present

## 2016-08-05 DIAGNOSIS — I25810 Atherosclerosis of coronary artery bypass graft(s) without angina pectoris: Secondary | ICD-10-CM | POA: Diagnosis not present

## 2016-08-05 NOTE — Telephone Encounter (Signed)
Patient's wife called stating at [facility] and the Rx for vancomycin is not there. Rx called in to pharmacy. Patient has hospital follow up appointment on 08/12/16.

## 2016-08-06 DIAGNOSIS — C069 Malignant neoplasm of mouth, unspecified: Secondary | ICD-10-CM | POA: Diagnosis not present

## 2016-08-06 DIAGNOSIS — B954 Other streptococcus as the cause of diseases classified elsewhere: Secondary | ICD-10-CM | POA: Diagnosis not present

## 2016-08-06 DIAGNOSIS — I330 Acute and subacute infective endocarditis: Secondary | ICD-10-CM | POA: Diagnosis not present

## 2016-08-06 DIAGNOSIS — I110 Hypertensive heart disease with heart failure: Secondary | ICD-10-CM | POA: Diagnosis not present

## 2016-08-06 DIAGNOSIS — I25810 Atherosclerosis of coronary artery bypass graft(s) without angina pectoris: Secondary | ICD-10-CM | POA: Diagnosis not present

## 2016-08-06 DIAGNOSIS — Z452 Encounter for adjustment and management of vascular access device: Secondary | ICD-10-CM | POA: Diagnosis not present

## 2016-08-07 ENCOUNTER — Emergency Department (HOSPITAL_COMMUNITY): Payer: Medicare Other

## 2016-08-07 ENCOUNTER — Encounter (HOSPITAL_COMMUNITY): Payer: Self-pay | Admitting: Emergency Medicine

## 2016-08-07 ENCOUNTER — Emergency Department (HOSPITAL_COMMUNITY)
Admission: EM | Admit: 2016-08-07 | Discharge: 2016-08-08 | Disposition: E | Payer: Medicare Other | Attending: Physician Assistant | Admitting: Physician Assistant

## 2016-08-07 DIAGNOSIS — Z85810 Personal history of malignant neoplasm of tongue: Secondary | ICD-10-CM | POA: Insufficient documentation

## 2016-08-07 DIAGNOSIS — I10 Essential (primary) hypertension: Secondary | ICD-10-CM | POA: Insufficient documentation

## 2016-08-07 DIAGNOSIS — Z8673 Personal history of transient ischemic attack (TIA), and cerebral infarction without residual deficits: Secondary | ICD-10-CM | POA: Diagnosis not present

## 2016-08-07 DIAGNOSIS — I2510 Atherosclerotic heart disease of native coronary artery without angina pectoris: Secondary | ICD-10-CM | POA: Insufficient documentation

## 2016-08-07 DIAGNOSIS — Z4682 Encounter for fitting and adjustment of non-vascular catheter: Secondary | ICD-10-CM | POA: Diagnosis not present

## 2016-08-07 DIAGNOSIS — Z85828 Personal history of other malignant neoplasm of skin: Secondary | ICD-10-CM | POA: Insufficient documentation

## 2016-08-07 DIAGNOSIS — Z951 Presence of aortocoronary bypass graft: Secondary | ICD-10-CM | POA: Diagnosis not present

## 2016-08-07 DIAGNOSIS — J90 Pleural effusion, not elsewhere classified: Secondary | ICD-10-CM | POA: Diagnosis not present

## 2016-08-07 DIAGNOSIS — Z7901 Long term (current) use of anticoagulants: Secondary | ICD-10-CM | POA: Insufficient documentation

## 2016-08-07 DIAGNOSIS — Z79899 Other long term (current) drug therapy: Secondary | ICD-10-CM | POA: Diagnosis not present

## 2016-08-07 DIAGNOSIS — I469 Cardiac arrest, cause unspecified: Secondary | ICD-10-CM | POA: Diagnosis not present

## 2016-08-07 LAB — I-STAT CHEM 8, ED
BUN: 72 mg/dL — AB (ref 6–20)
CALCIUM ION: 1 mmol/L — AB (ref 1.15–1.40)
CREATININE: 4.3 mg/dL — AB (ref 0.61–1.24)
Chloride: 103 mmol/L (ref 101–111)
Glucose, Bld: 28 mg/dL — CL (ref 65–99)
HCT: 31 % — ABNORMAL LOW (ref 39.0–52.0)
Hemoglobin: 10.5 g/dL — ABNORMAL LOW (ref 13.0–17.0)
Potassium: 6.3 mmol/L (ref 3.5–5.1)
Sodium: 134 mmol/L — ABNORMAL LOW (ref 135–145)
TCO2: 14 mmol/L (ref 0–100)

## 2016-08-07 LAB — I-STAT VENOUS BLOOD GAS, ED
ACID-BASE DEFICIT: 23 mmol/L — AB (ref 0.0–2.0)
BICARBONATE: 10 mmol/L — AB (ref 20.0–28.0)
O2 SAT: 75 %
PO2 VEN: 69 mmHg — AB (ref 32.0–45.0)
TCO2: 12 mmol/L (ref 0–100)
pCO2, Ven: 54.9 mmHg (ref 44.0–60.0)
pH, Ven: 6.868 — CL (ref 7.250–7.430)

## 2016-08-07 LAB — CBG MONITORING, ED: Glucose-Capillary: 41 mg/dL — CL (ref 65–99)

## 2016-08-07 LAB — I-STAT CG4 LACTIC ACID, ED: Lactic Acid, Venous: >17 mmol/L (ref 0.5–1.9)

## 2016-08-07 LAB — I-STAT TROPONIN, ED: Troponin i, poc: 0.13 ng/mL (ref 0.00–0.08)

## 2016-08-07 MED ORDER — FENTANYL CITRATE (PF) 100 MCG/2ML IJ SOLN
INTRAMUSCULAR | Status: AC
  Filled 2016-08-07: qty 2

## 2016-08-07 MED ORDER — DEXTROSE 50 % IV SOLN
1.0000 | Freq: Once | INTRAVENOUS | Status: DC
  Filled 2016-08-07: qty 50

## 2016-08-07 MED ORDER — MORPHINE SULFATE (PF) 4 MG/ML IV SOLN
INTRAVENOUS | Status: AC
  Filled 2016-08-07: qty 2

## 2016-08-07 MED ORDER — EPINEPHRINE PF 1 MG/ML IJ SOLN
0.5000 ug/min | INTRAMUSCULAR | Status: DC
  Filled 2016-08-07: qty 4

## 2016-08-07 MED ORDER — FENTANYL CITRATE (PF) 100 MCG/2ML IJ SOLN
INTRAMUSCULAR | Status: DC | PRN
  Administered 2016-08-07: 100 ug via INTRAVENOUS

## 2016-08-07 MED ORDER — SODIUM CHLORIDE 0.9 % IV BOLUS (SEPSIS): 1000.0000 mL | Freq: Once | INTRAVENOUS | Status: DC

## 2016-08-07 MED ORDER — DEXTROSE 50 % IV SOLN
INTRAVENOUS | Status: DC | PRN
  Administered 2016-08-07 (×3): 25 mL via INTRAVENOUS

## 2016-08-07 MED ORDER — EPINEPHRINE PF 1 MG/10ML IJ SOSY
PREFILLED_SYRINGE | INTRAMUSCULAR | Status: DC | PRN
  Administered 2016-08-07: 1 via INTRAVENOUS

## 2016-08-07 MED ORDER — DEXTROSE 50 % IV SOLN
INTRAVENOUS | Status: AC
  Filled 2016-08-07: qty 50

## 2016-08-07 MED ORDER — SODIUM BICARBONATE 8.4 % IV SOLN
INTRAVENOUS | Status: AC | PRN
  Administered 2016-08-07: 50 meq via INTRAVENOUS

## 2016-08-07 MED ORDER — EPINEPHRINE PF 1 MG/10ML IJ SOSY
PREFILLED_SYRINGE | INTRAMUSCULAR | Status: AC | PRN
Start: 2016-08-07 — End: 2016-08-07
  Administered 2016-08-07 (×2): 1 via INTRAVENOUS

## 2016-08-07 MED FILL — Medication: Qty: 1 | Status: AC

## 2016-08-08 NOTE — Code Documentation (Signed)
CBG 26 D50 given , recheck 41 2 more amps of D50 given per MD

## 2016-08-08 NOTE — ED Notes (Signed)
CBG 41; RN aware

## 2016-08-08 NOTE — ED Triage Notes (Signed)
Pt here cpr in progress, pt witnessed arrest by wife at at 77  She started cpr continued by fire , ems gave 7 epi's regained pulses for around 11mins , lost pulses arrived cpr in progress with king airway

## 2016-08-08 NOTE — Code Documentation (Signed)
Pt went into [facility] at bedside ,wife at bedside does not want any extended life extending measures done ,

## 2016-08-08 NOTE — ED Provider Notes (Signed)
[place] DEPT Provider Note   CSN: [id number] Arrival date & time: 08-09-2016  2831     History   Chief Complaint Chief Complaint  Patient presents with  . Cardiac Arrest    HPI Peter Bruce is a 66 y.o. male.  HPI   Patient is a 66 year old male presenting in cardiac arrest. Patient was called out as small stroke at 8:10 AM. On arrival of EMS patient was in cardiac arrest. Patient came bradycardic and then pulseless. Patient came in with a PICC line to the right arm, IV placed in the left arm by EMS and IO in the left tib-fib by EMS.  According to EMS he was discharged from rehabilitation yesterday and has not been on his anticoagulation.  He had 7 of epi prior to arrival with return of spontaneous return of circulation for a period of time and started on IV drip.  On review of patient's file it appears that he is chronically ill with recent endocarditis with septic emboli to brain, PE.   Past Medical History:  Diagnosis Date  . Acute CHF (congestive heart failure) ([place]) 11/01/2015  . Acute on chronic diastolic heart failure ([place]) 09/27/2015  . Acute pulmonary embolus ([place]) 2010  . Acute respiratory failure with hypoxia ([place]) 09/09/2015  . Arthritis   . Atrial fibrillation [facility])    s/p inpatient DCCV 09/11/2015  . Atrial flutter ([place])   . CHF (congestive heart failure) ([place])   . Coronary artery disease involving native coronary artery 05/15/2015   multivessel  . Dysrhythmia    A-fib  . Essential hypertension   . Fibromyalgia   . History of kidney stones   . Hypercholesterolemia   . Hypertension   . Lupus anticoagulant positive 07/29/2008  . Mitral regurgitation 10/30/2015  . PMR (polymyalgia rheumatica) (HCC)   . Pneumonia   . 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 S/P Maze operation for atrial fibrillation 05/29/2015   Complete bilateral atrial lesion set via median sternotomy using bipolar  radiofrequency and cryothermy ablation with clipping of LA appendage  . 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 Squamous cell cancer of buccal mucosa [facility])     Patient Active Problem List   Diagnosis Date Noted  . Clostridium difficile infection 07/29/2016  . Endocarditis of mitral valve 07/26/2016  . Cerebellar stroke ([place]) 07/25/2016  . Bradycardia 07/24/2016  . Syncope and collapse 07/24/2016  . Sepsis ([place]) 07/24/2016  . Lobar pneumonia ([place])   . HCAP (healthcare-associated pneumonia) 07/19/2016  . Dehydration 07/19/2016  . Cerebrovascular accident (CVA) due to bilateral embolism of middle cerebral arteries ([place])   . Lupus erythematosus   . PMR (polymyalgia rheumatica) (HCC)   . Coronary artery disease involving coronary bypass graft of native heart without angina pectoris   . Benign essential HTN   . Tachypnea   . Leukocytosis   . Anemia of chronic disease   . Thrombocytopenia ([place])   . Cancer of base of tongue ([place]) 06/14/2016  . Cerebral embolism with cerebral infarction 06/14/2016  . Streptococcal bacteremia   . Prosthetic valve endocarditis ([place])   . Septic embolism ([place])   . Hypercoagulable state ([place])   . Other streptococcal sepsis ([place]) 06/12/2016  . Sepsis, unspecified organism ([place]) 06/11/2016  .  Hyponatremia 06/11/2016  . Normocytic anemia 06/11/2016  . S/P minimally invasive mitral valve repair 11/21/2015  . Hypertensive heart disease with CHF (congestive heart failure) ([place])   . Mitral regurgitation 10/30/2015  . Fatigue 10/14/2015  . Current use of long term anticoagulation 09/27/2015  . Bilateral pleural effusion 09/27/2015  . Chronic diastolic CHF (congestive heart failure) ([place]) 09/27/2015  . Respiratory failure with hypoxia ([place]) 09/09/2015  . Atrial flutter ([place])   . S/P CABG x 3  and maze procedure 05/29/2015  . Coronary artery disease involving native coronary artery 05/15/2015  . Dyslipidemia 04/14/2015  . Polymyalgia rheumatica--rheumatol [place]-2016 12/19/2013  . Obesity (BMI 30-39.9)   . BPH (benign prostatic hyperplasia) 08/30/2011  . Paroxysmal atrial fibrillation ([place]) 08/30/2011  . Chronic pain 08/30/2011  . Anxiety 04/21/2011  . History of pulmonary embolism 10/02/2008  . Lupus anticoagulant positive 07/29/2008    Past Surgical History:  Procedure Laterality Date  . APPENDECTOMY    . arm surgery  Right    wrist and elbow  . CARDIAC CATHETERIZATION N/A 05/15/2015   Procedure: Left Heart Cath and Coronary Angiography;  Surgeon: Peter M Martinique, MD;  Location: [place] CV LAB;  Service: Cardiovascular;  Laterality: N/A;  . CARDIAC CATHETERIZATION N/A 11/17/2015   Procedure: Right/Left Heart Cath and Coronary/Graft Angiography;  Surgeon: Adrian Prows, MD;  Location: [place] CV LAB;  Service: Cardiovascular;  Laterality: N/A;  . CARDIOVERSION N/A 09/11/2015   Procedure: CARDIOVERSION;  Surgeon: Lelon Perla, MD;  Location: [facility] ENDOSCOPY;  Service: Cardiovascular;  Laterality: N/A;  . CHEST TUBE INSERTION  11/21/2015   Procedure: LEFT CHEST TUBE INSERTION;  Surgeon: Rexene Alberts, MD;  Location: [place];  Service: Open Heart Surgery;;  . CLIPPING OF ATRIAL APPENDAGE  05/29/2015   Procedure: CLIPPING OF LEFT ATRIAL APPENDAGE;  Surgeon: Rexene Alberts, MD;  Location: [place];  Service: Open Heart Surgery;;  45 AtriClip PRO 145  . CORONARY ARTERY BYPASS GRAFT N/A 05/29/2015   Procedure: CORONARY ARTERY BYPASS GRAFTING (CABG), ON PUMP, TIMES THREE, USING LEFT INTERNAL MAMMARY ARTERY, BILATERAL GREATER SAPHENOUS VEINS HARVESTED ENDOSCOPICALLY;  Surgeon: Rexene Alberts, MD;  Location: [place];  Service: Open Heart Surgery;  Laterality: N/A;  . FRACTURE SURGERY     right wrist  . HERNIA REPAIR    . MAZE N/A 05/29/2015   Procedure: MAZE;  Surgeon: Rexene Alberts,  MD;  Location: [place];  Service: Open Heart Surgery;  Laterality: N/A;  Complete Bi-Atrial Lesion set with Ablation and Cryothermy  . MITRAL VALVE REPAIR Right 11/21/2015   Procedure: MINIMALLY INVASIVE REOPERATION FOR MITRAL VALVE REPAIR;  Surgeon: Rexene Alberts, MD;  Location: [place];  Service: Open Heart Surgery;  Laterality: Right;  . PERIPHERAL VASCULAR CATHETERIZATION Bilateral 11/17/2015   Procedure: Renal Angiography;  Surgeon: Adrian Prows, MD;  Location: [place] CV LAB;  Service: Cardiovascular;  Laterality: Bilateral;  . PERIPHERAL VASCULAR CATHETERIZATION N/A 11/17/2015   Procedure: Abdominal Aortogram;  Surgeon: Adrian Prows, MD;  Location: [place] CV LAB;  Service: Cardiovascular;  Laterality: N/A;  . SHOULDER SURGERY Left    clavicular fracture  . TEE WITHOUT CARDIOVERSION N/A 05/29/2015   Procedure: TRANSESOPHAGEAL ECHOCARDIOGRAM (TEE);  Surgeon: Rexene Alberts, MD;  Location: [place];  Service: Open Heart Surgery;  Laterality: N/A;  . TEE WITHOUT CARDIOVERSION N/A 10/30/2015   Procedure: TRANSESOPHAGEAL ECHOCARDIOGRAM (TEE);  Surgeon: Adrian Prows, MD;  Location: [place];  Service: Cardiovascular;  Laterality: N/A;  . TEE WITHOUT CARDIOVERSION  N/A 11/21/2015   Procedure: TRANSESOPHAGEAL ECHOCARDIOGRAM (TEE);  Surgeon: Rexene Alberts, MD;  Location: [place];  Service: Open Heart Surgery;  Laterality: N/A;  . TEE WITHOUT CARDIOVERSION N/A 07/26/2016   Procedure: TRANSESOPHAGEAL ECHOCARDIOGRAM (TEE);  Surgeon: Adrian Prows, MD;  Location: [facility] ENDOSCOPY;  Service: Cardiovascular;  Laterality: N/A;       Home Medications    Prior to Admission medications   Medication Sig Start Date End Date Taking? Authorizing Provider  acetaminophen (TYLENOL) 325 MG tablet Take 2 tablets (650 mg total) by mouth every 6 (six) hours as needed for mild pain (or Fever >/= 101). 07/23/16   Mauricio Gerome Apley, MD  cefTRIAXone 2 g in dextrose 5 % 50 mL Inject 2 g into the vein daily. 07/29/16 09/06/16   Donzetta Starch, NP  furosemide (LASIX) 40 MG tablet Take 1 tablet (40 mg total) by mouth daily. 07/30/16   Donzetta Starch, NP  metoprolol succinate (TOPROL-XL) 25 MG 24 hr tablet Take 1 tablet (25 mg total) by mouth daily. 07/30/16   Donzetta Starch, NP  Multiple Vitamins-Minerals (CERTAVITE/ANTIOXIDANTS) TABS Take 1 tablet by mouth daily.    Historical Provider, MD  omeprazole (PRILOSEC) 20 MG capsule Take 20 mg by mouth daily.    Historical Provider, MD  ondansetron (ZOFRAN ODT) 8 MG disintegrating tablet Take 1 tablet (8 mg total) by mouth every 8 (eight) hours as needed for nausea or vomiting. 07/16/16   Carlyle Basques, MD  oxyCODONE-acetaminophen (PERCOCET/ROXICET) 5-325 MG tablet Take 1 tablet by mouth every 8 (eight) hours as needed for moderate pain. 07/29/16   Donzetta Starch, NP  potassium chloride SA (K-DUR,KLOR-CON) 20 MEQ tablet Take 1 tablet (20 mEq total) by mouth daily. 07/30/16   Donzetta Starch, NP  rosuvastatin (CRESTOR) 10 MG tablet Take 1 tablet (10 mg total) by mouth daily at 6 PM. 07/29/16   Donzetta Starch, NP  saccharomyces boulardii (FLORASTOR) 250 MG capsule Take 1 capsule (250 mg total) by mouth 2 (two) times daily. 07/29/16   Donzetta Starch, NP  sodium chloride flush (NS) 0.9 % SOLN 10-40 mLs by Intracatheter route as needed (flush). 07/29/16   Donzetta Starch, NP  vancomycin (VANCOCIN) 50 mg/mL oral solution Take 2.5 mLs (125 mg total) by mouth every 6 (six) hours. 08/04/16   Michel Bickers, MD  warfarin (COUMADIN) 5 MG tablet Take 7.5 mg by mouth daily.    Historical Provider, MD    Family History Family History  Problem Relation Age of Onset  . Cancer Mother   . Other Sister     chf  . Other Sister     Rosalee Kaufman    Social History Social History  Substance Use Topics  . Smoking status: Never Smoker  . Smokeless tobacco: Never Used  . Alcohol use No     Allergies   Amitriptyline and Serotonin reuptake inhibitors (ssris)   Review of Systems Review of Systems  Unable  to perform ROS: Intubated     Physical Exam Updated Vital Signs There were no vitals taken for this visit.  Physical Exam  HENT:  Intubated. Bleeding at right nare  Eyes:  Fixed dilated  Cardiovascular:  afib  Pulmonary/Chest:  Breath sounds less on right  Abdominal:  No evidenc of trauma  Musculoskeletal:  Io in left tib  Neurological:  No spontaneous movement.  Skin: Skin is warm and dry.     ED Treatments / Results  Labs (all labs ordered are  listed, but only abnormal results are displayed) Labs Reviewed - No data to display  EKG  EKG Interpretation None       Radiology No results found.  Procedures Procedures (including critical care time)  Medications Ordered in ED Medications  EPINEPHrine (ADRENALIN) 4 mg in dextrose 5 % 250 mL (0.016 mg/mL) infusion (not administered)  EPINEPHrine (ADRENALIN) 1 MG/10ML injection (1 Syringe Intravenous Given 08/15/2016 0930)  fentaNYL (SUBLIMAZE) 100 MCG/2ML injection (not administered)     Initial Impression / Assessment and Plan / ED Course  I have reviewed the triage vital signs and the nursing notes.  Pertinent labs & imaging results that were available during my care of the patient were reviewed by me and considered in my medical decision making (see chart for details).     Patient's 66 year old male presenting in cardiac arrest. Patient had prolonged downtime prior to arrival. Patient was in asystole on arrival. We used new access point, the patient's PICC line on the right-hand side to administer epi on the third round immediately got return of spontaneous circulation. Patient was intubated. Epi drip started. US showed good cardiac activity, a fib.  Patient lost pulses X2, hypotension. Fluid boluis given, epi increased.   Discussed with patient's wife and his friend.  Pt did not want heroic efforts.    Will not escalate care.  Pt into vtach, pulseless.  Will respect patient's wishes, make comfortable.  Pt  time of death 76 am.    EMERGENCY DEPARTMENT [place] CARDIAC EXAM "Study: Limited Ultrasound of the Heart and Pericardium"  INDICATIONS:Cardiac arrest Multiple views of the heart and pericardium were obtained in real-time with a multi-frequency probe.  PERFORMED ZO:XWRUEA IMAGES ARCHIVED?: Yes LIMITATIONS:  Emergent procedure VIEWS USED: Parasternal long axis INTERPRETATION: Cardiac activity present   CRITICAL CARE Performed by: Gardiner Sleeper Total critical care time: 79 minutes Critical care time was exclusive of separately billable procedures and treating other patients. Critical care was necessary to treat or prevent imminent or life-threatening deterioration. Critical care was time spent personally by me on the following activities: development of treatment plan with patient and/or surrogate as well as nursing, discussions with consultants, evaluation of patient's response to treatment, examination of patient, obtaining history from patient or surrogate, ordering and performing treatments and interventions, ordering and review of laboratory studies, ordering and review of radiographic studies, pulse oximetry and re-evaluation of patient's condition.  INTUBATION Performed by: Will Dansie Required items: required blood products, implants, devices, and special equipment available Patient identity confirmed: provided demographic data and hospital-assigned identification number Time out: Immediately prior to procedure a "time out" was called to verify the correct patient, procedure, equipment, support staff and site/side marked as required.  Indications: cardiac arrest  Intubation method: Glidescope Laryngoscopy   Preoxygenation: BVM  Sedatives:NONE Tube Size: 7.5cuffed  Post-procedure assessment: chest rise and ETCO2 monitor Breath sounds: equal and absent over the epigastrium Tube secured with: ETT holder Chest x-ray interpreted by radiologist and me.  Chest x-ray  findings: endotracheal tube in appropriate position  Patient tolerated the procedure well with no immediate complications.     Nursing discussed with Dr. Einar Gip, who will sign death certificate.  Not an ME case.   Final Clinical Impressions(s) / ED Diagnoses   Final diagnoses:  None    New Prescriptions New Prescriptions   No medications on file     Rusty Villella Julio Alm, MD 2016-08-15 1550

## 2016-08-08 NOTE — Progress Notes (Signed)
   2016/09/06 0953  Clinical Encounter Type  Visited With Patient and family together  Visit Type ED  Referral From Nurse  Consult/Referral To Chaplain  Spiritual Encounters  Spiritual Needs Emotional  Pt came in with code, cardiac arrest, CPR.  Wife is @bedside , contacting other family members.  Looking towards a possible comfort care if patient codes again, pt. actively dying.  Chaplain rendering a ministry of presence, no other needs able to assess at this time. Will follow up as needed.  Chaplain Kerman Pfost A. Aireana Ryland , MA-PC , BA-REL/PHIL, [phone]

## 2016-08-08 DEATH — deceased

## 2016-08-12 ENCOUNTER — Ambulatory Visit: Payer: Medicare Other | Admitting: Internal Medicine

## 2016-08-17 ENCOUNTER — Ambulatory Visit: Payer: Medicare Other | Admitting: Physical Therapy

## 2016-08-17 ENCOUNTER — Ambulatory Visit: Payer: Medicare Other

## 2016-08-17 ENCOUNTER — Encounter: Payer: Medicare Other | Admitting: Nutrition

## 2016-11-01 IMAGING — CR DG CHEST 2V
2 series · 2 of 2 positions shown · non-contrast
Comparison: 06/03/2015

CLINICAL DATA: Status post bypass surgery and clipping of left
atrial appendage 05/29/2015.

EXAM:
CHEST  2 VIEW

[w chest pa]
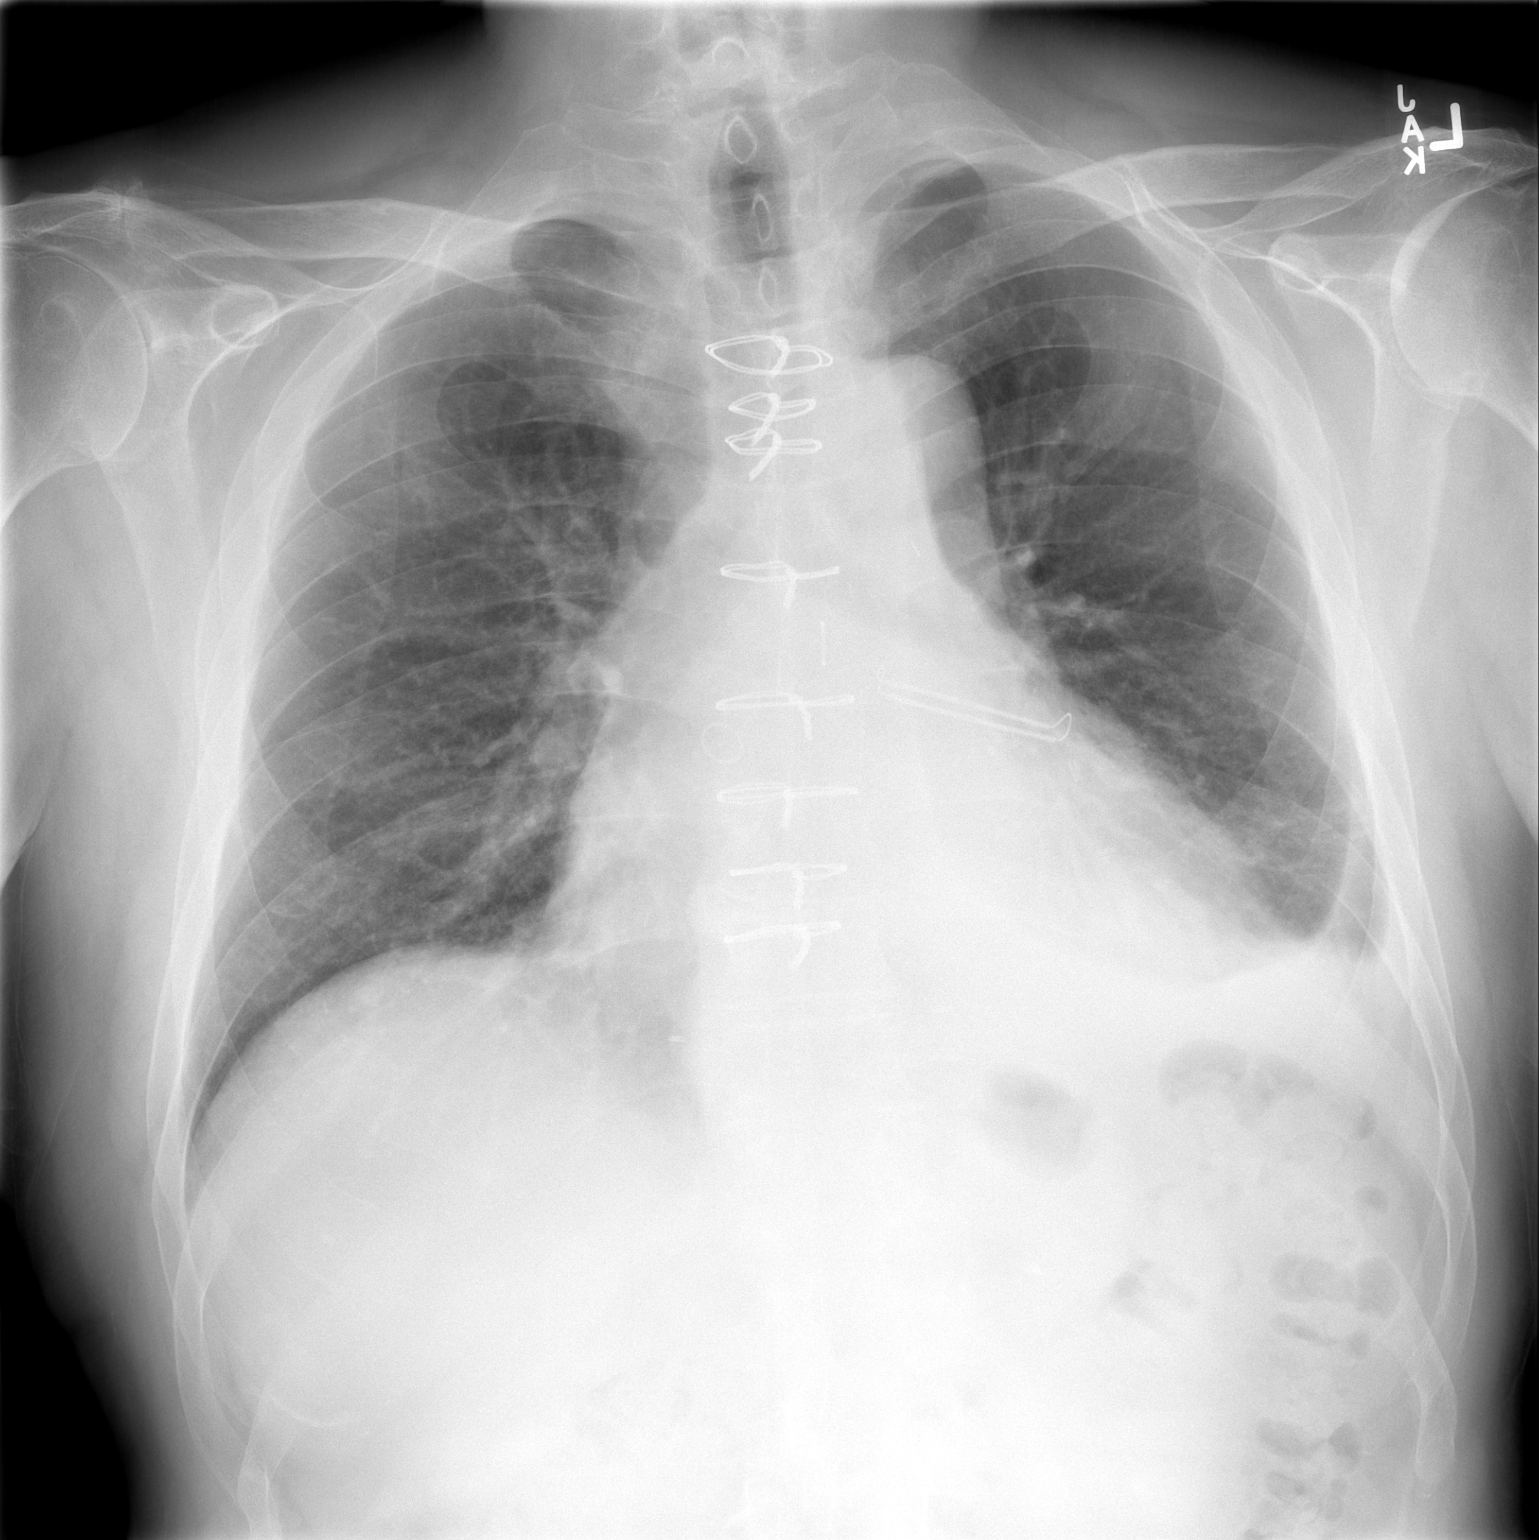

[w chest lat]
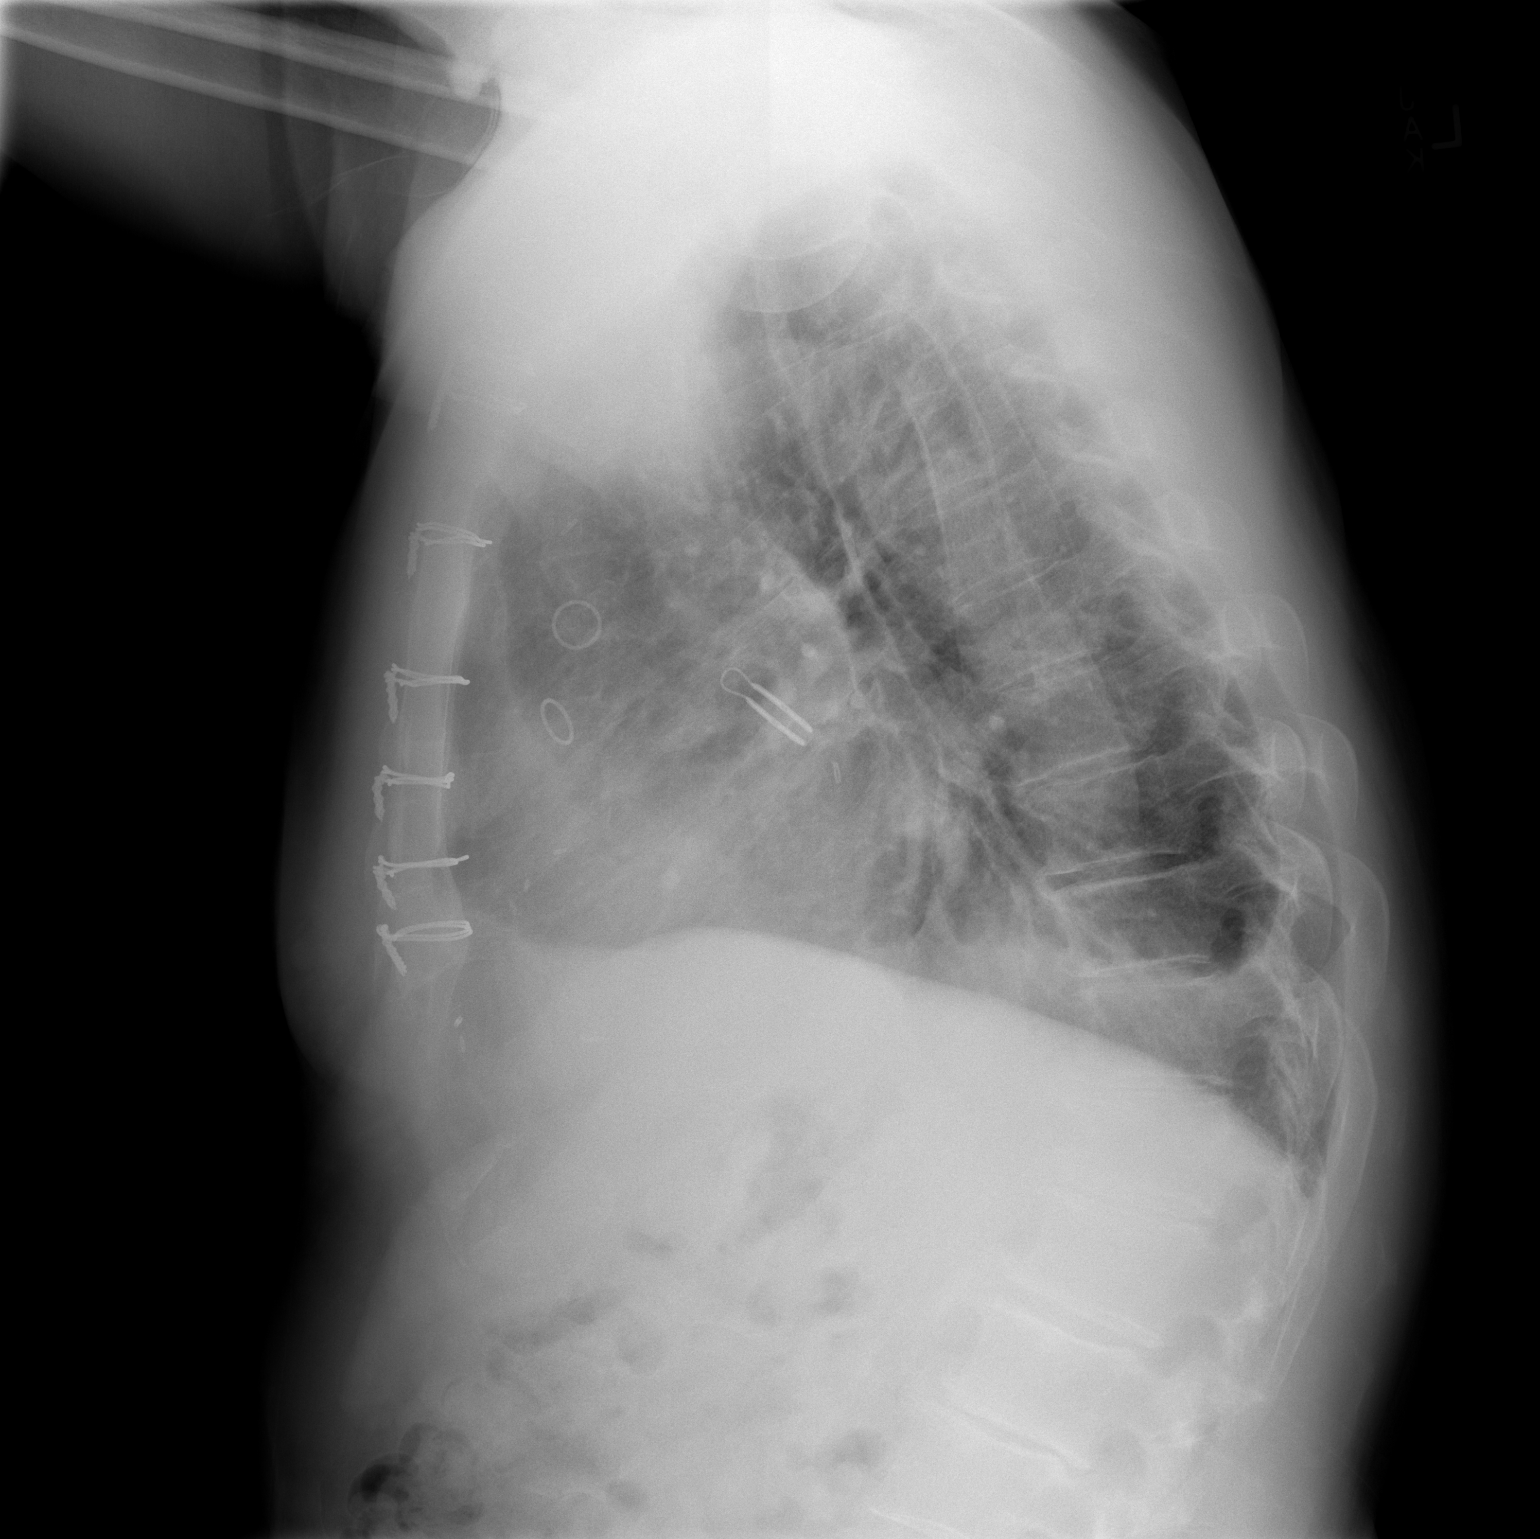

[2 of 2 positions shown; findings below may reference images not displayed]

FINDINGS: The heart is mildly enlarged but stable. Mild tortuosity of the
thoracic aorta. Stable surgical changes from bypass surgery and left
atrial appendage closure device. There is a small residual left
pleural effusion and overlying atelectasis, improved since prior
film. No edema or infiltrates. No pneumothorax.
IMPRESSION: Small residual left pleural effusion with overlying atelectasis,
improved since prior chest film.

## 2016-11-15 ENCOUNTER — Ambulatory Visit: Payer: Medicare Other | Admitting: Thoracic Surgery (Cardiothoracic Vascular Surgery)

## 2016-12-17 ENCOUNTER — Encounter: Payer: Self-pay | Admitting: Radiation Oncology

## 2017-04-17 IMAGING — DX DG CHEST 2V
2 series · 2 of 2 positions shown · non-contrast
Comparison: Radiographs November 24, 2015.

CLINICAL DATA: Pleural effusion.

EXAM:
CHEST  2 VIEW

[chest pa]
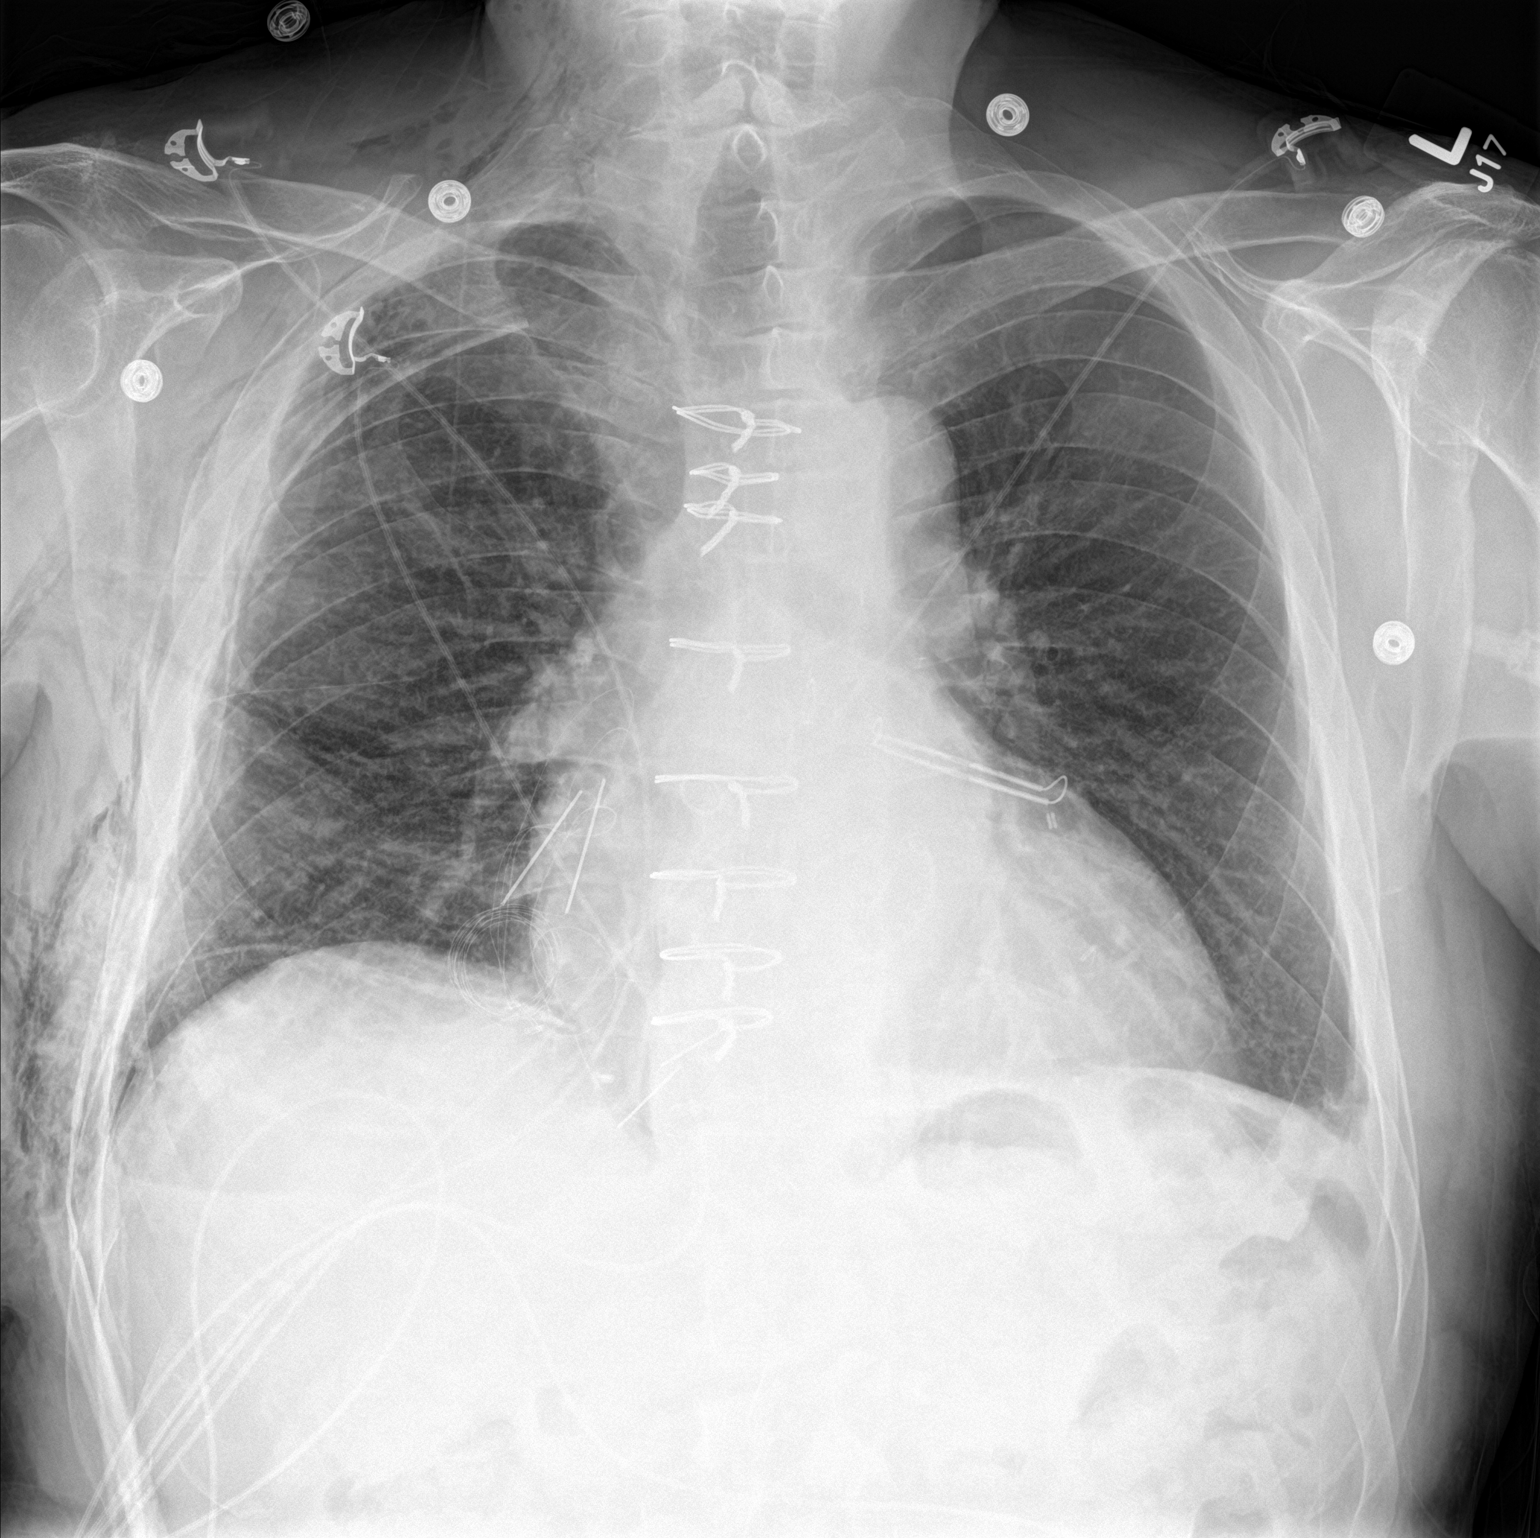

[chest lat]
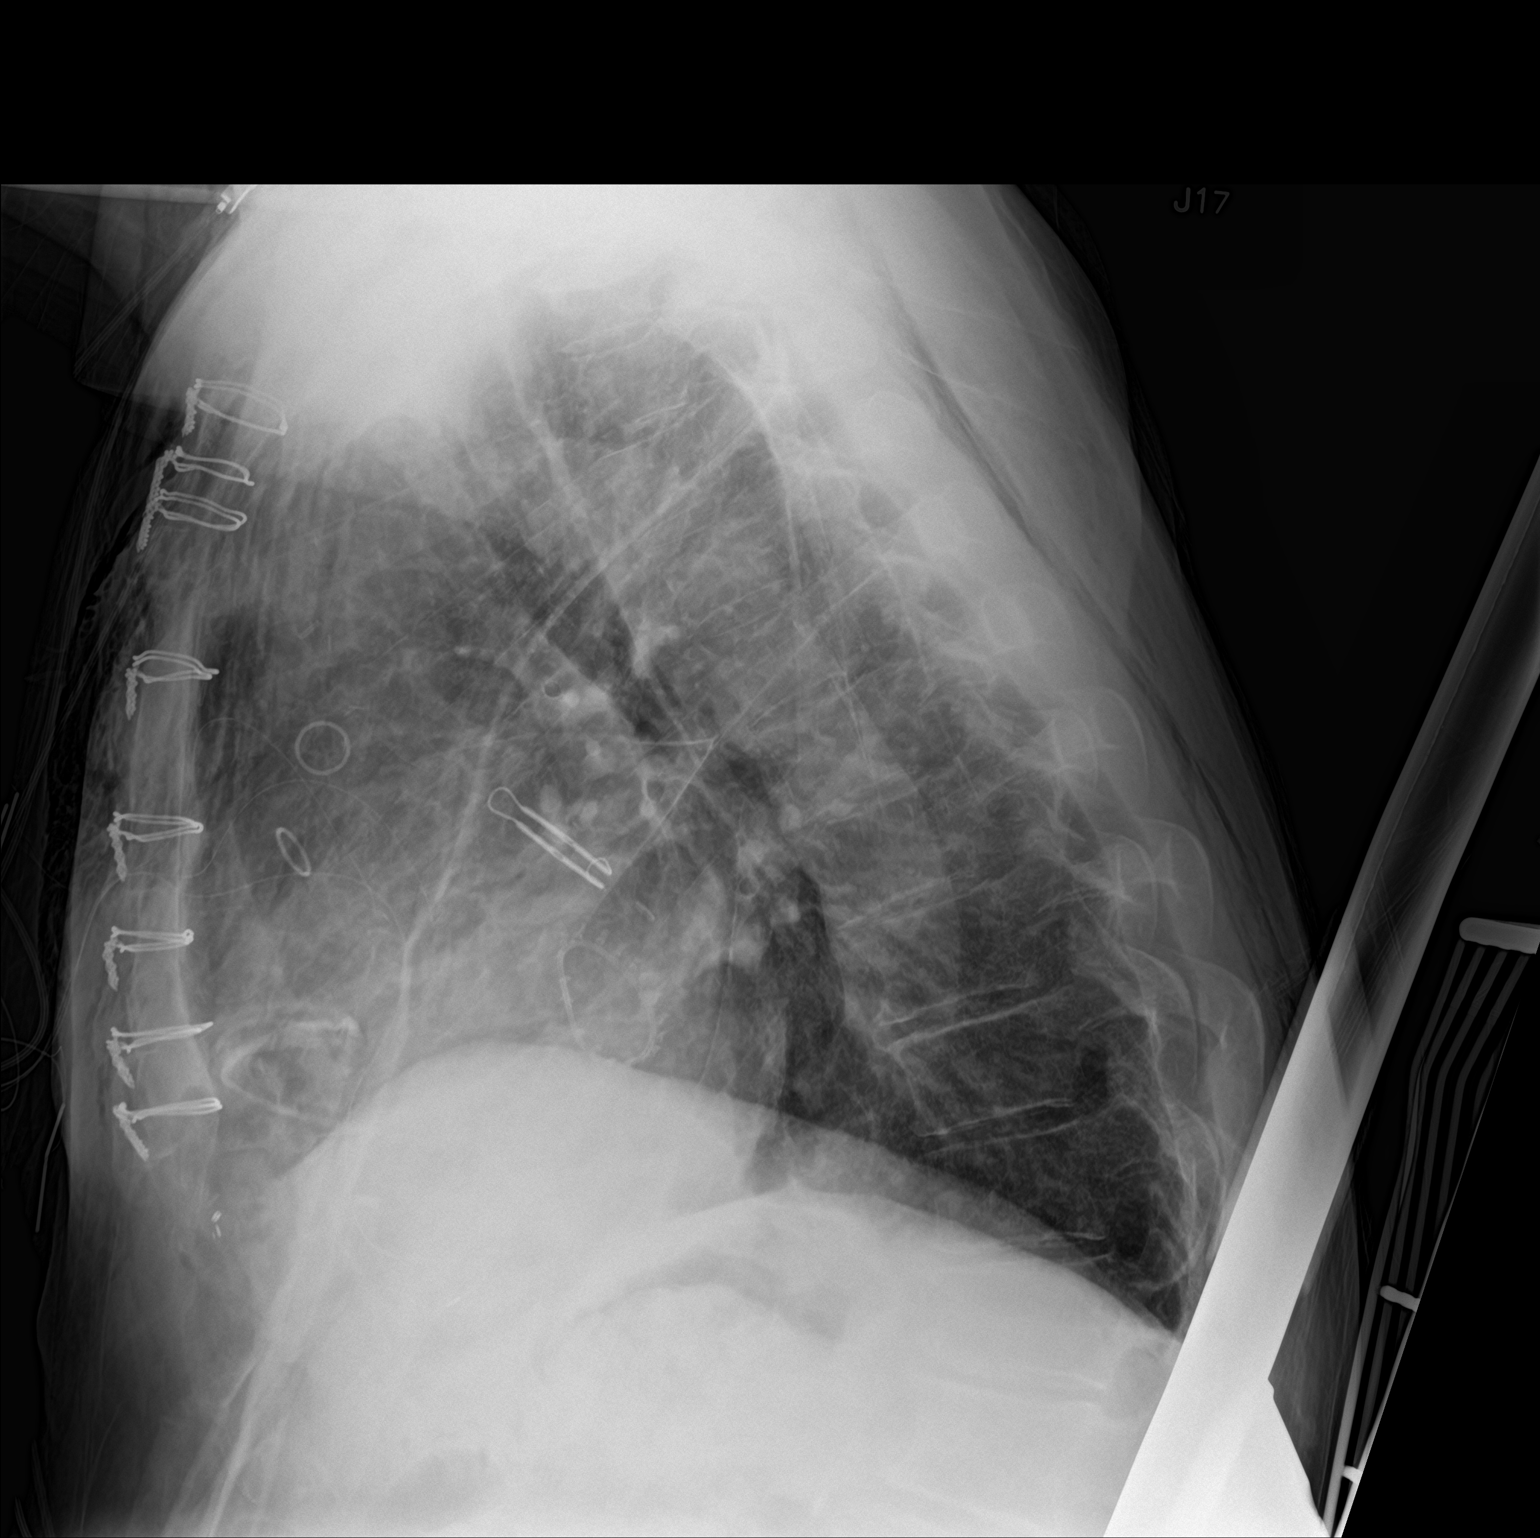

[2 of 2 positions shown; findings below may reference images not displayed]

FINDINGS: Stable cardiomediastinal silhouette. Status post Coronary artery
bypass graft. Left lung is clear. Right-sided chest tube is
unchanged in position. There is a significantly increased amount of
subcutaneous emphysema over right chest wall. No definite
pneumothorax is seen currently. Bony thorax is unremarkable.
IMPRESSION: Right-sided chest tube is unchanged in position. Significantly
increased amount of subcutaneous emphysema is seen over right
lateral chest wall.

## 2017-12-31 IMAGING — DX DG CHEST 1V PORT
2 series · 2 of 2 positions shown · non-contrast
Comparison: 07/24/2016 and previous

CLINICAL DATA: Post CPR and intubation

EXAM:
PORTABLE CHEST - 1 VIEW

[chest ap (1 of 2)]
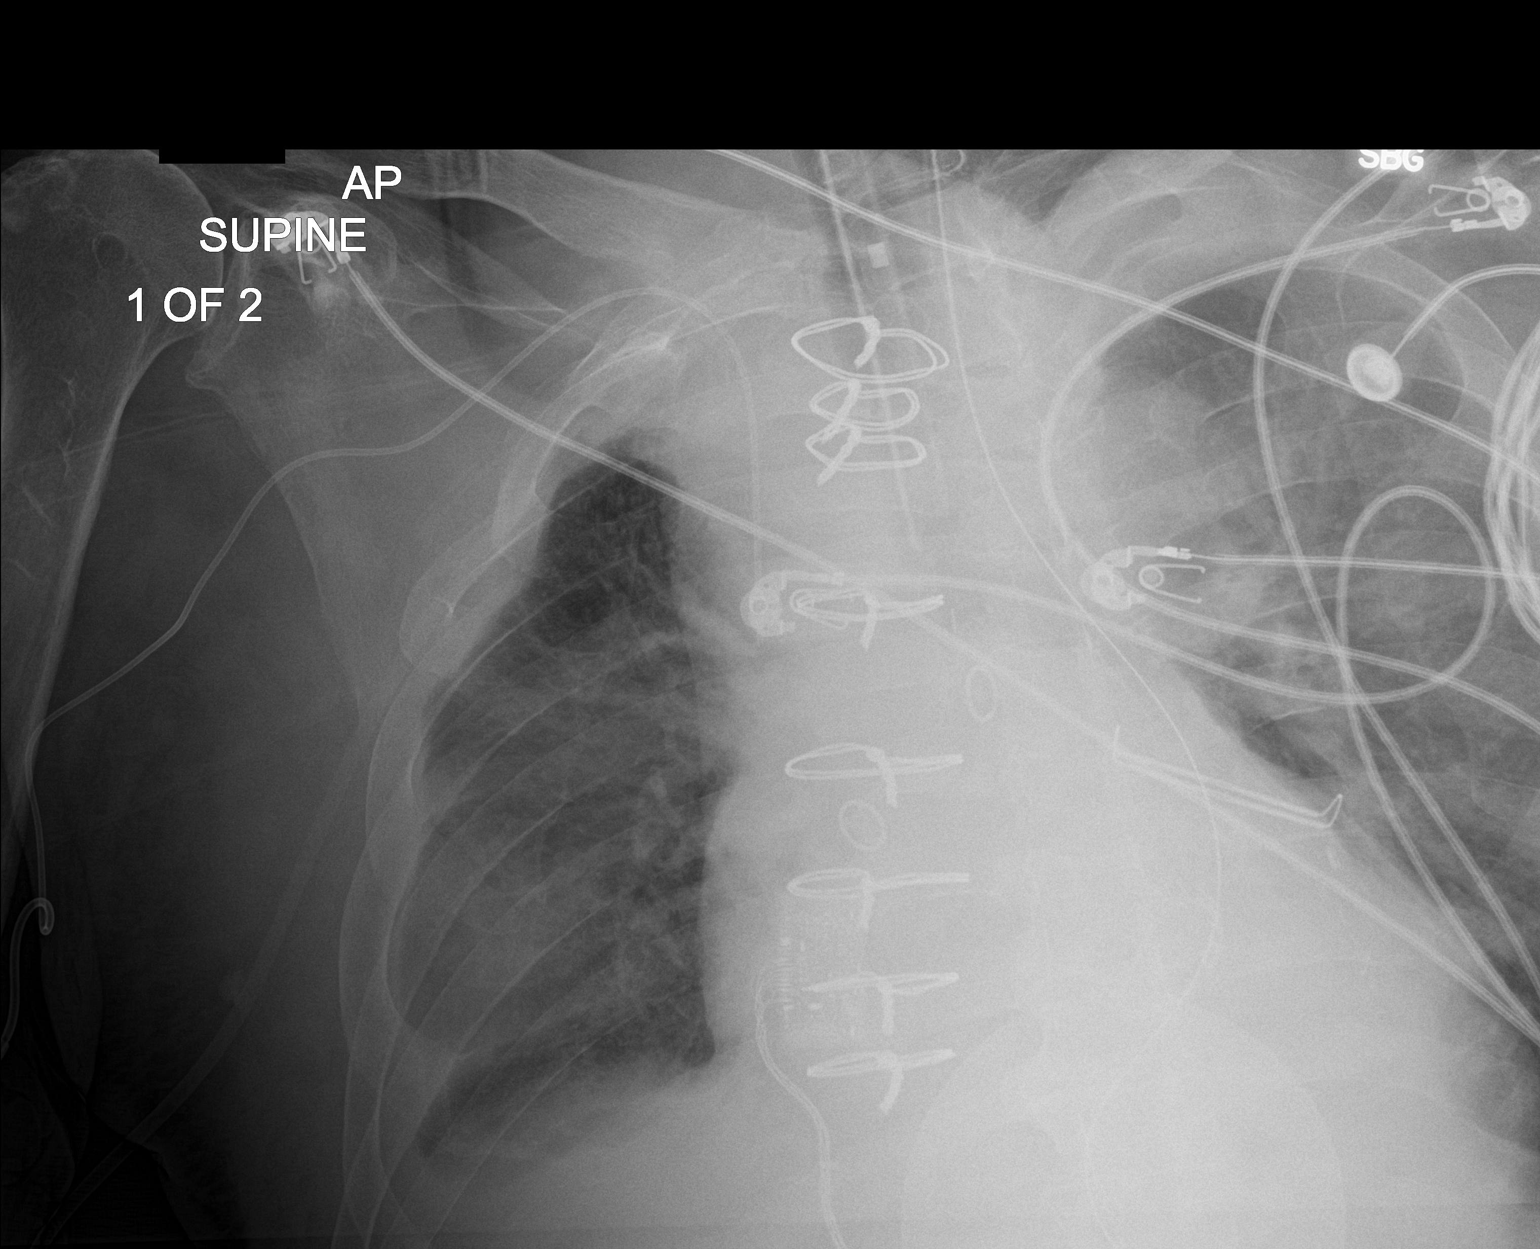

[chest ap (2 of 2)]
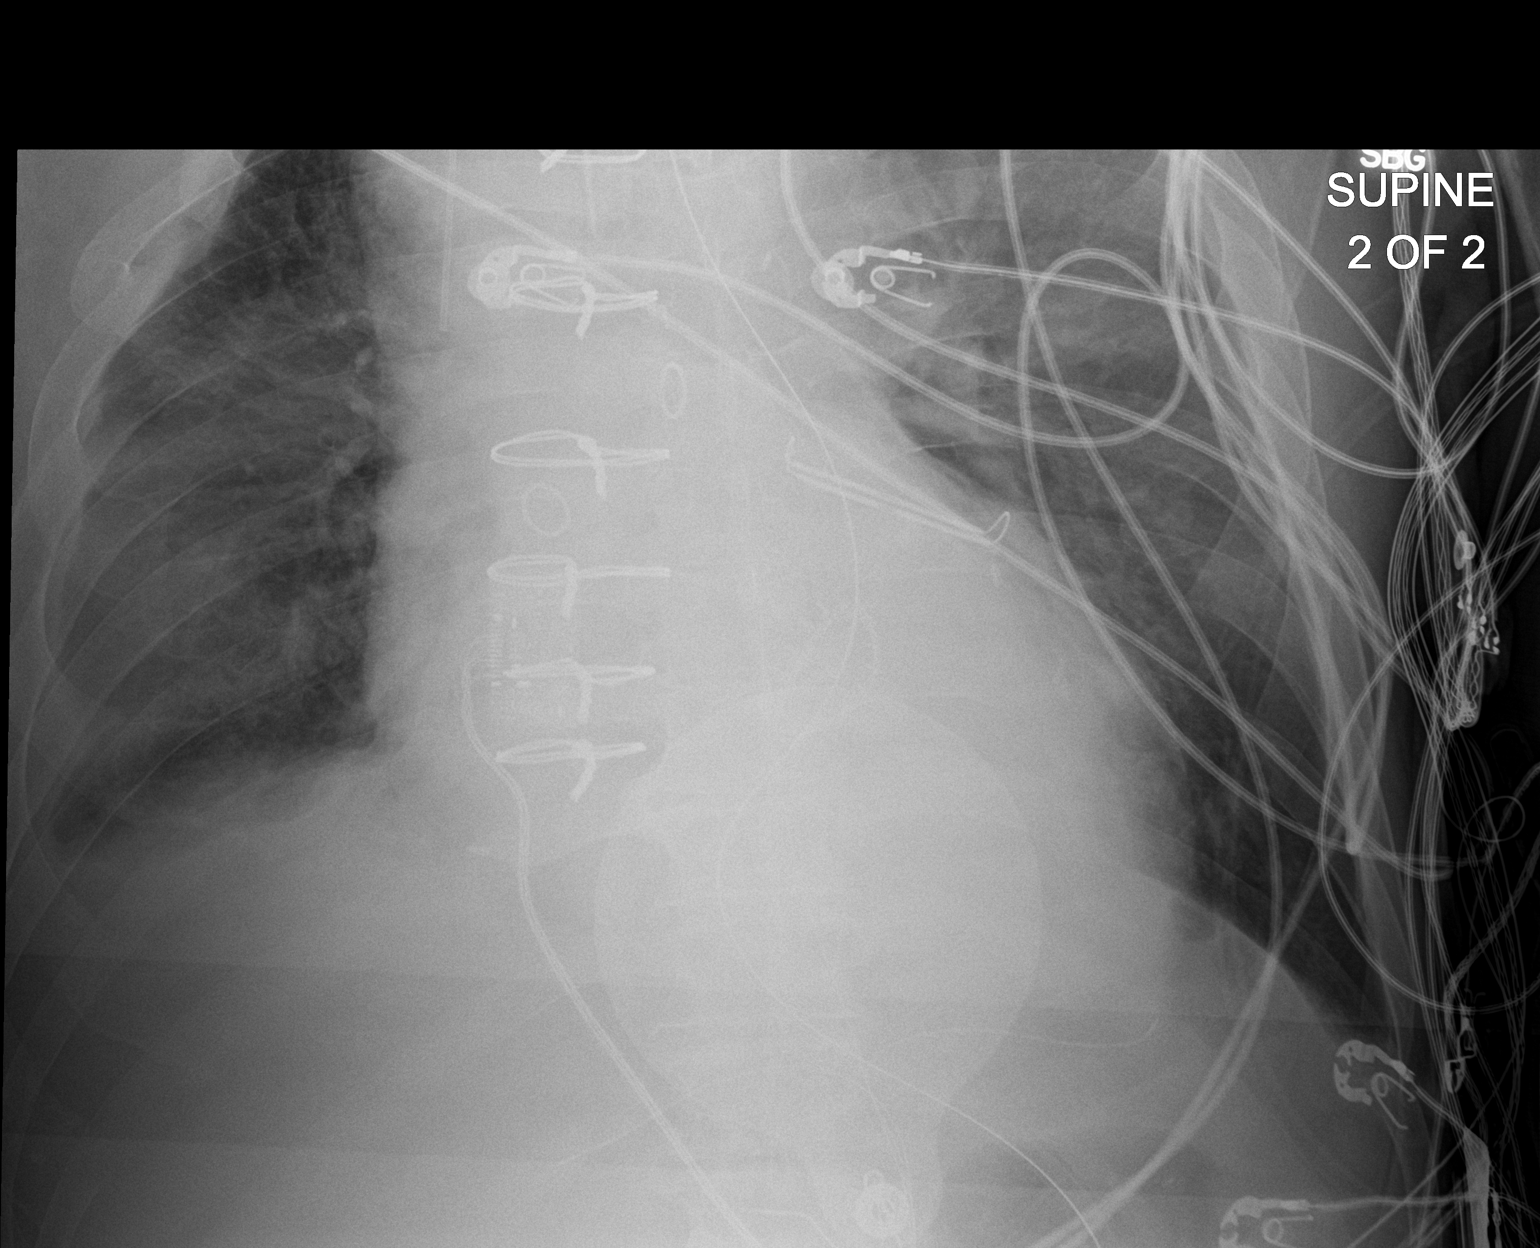

[2 of 2 positions shown; findings below may reference images not displayed]

FINDINGS: Endotracheal tube tip 2.2 cm above carina. Nasogastric tube extends
at least as far as the stomach, tip not visualized. Right arm PICC
to the mid SVC. Previous median sternotomy and CABG.

Widening of the superior mediastinum and obscuring of the aortic
arch, progressive since previous study of 07/19/2016. Heart size
upper limits normal as before.

Small right pleural effusion. No definite left effusion. No definite
pneumothorax.

Visualized bones unremarkable.
IMPRESSION: 1. Widening of the superior mediastinum with obscuring of the aortic
arch. Can't exclude mediastinal hematoma. Consider CT chest for
further evaluation if this is remains a clinical concern.
2. Support hardware projecting in expected location. No
pneumothorax.
3. Small right pleural effusion as before.
